# Patient Record
Sex: Female | Born: 1935 | Race: White | Hispanic: No | Smoking: Never smoker
Health system: Southern US, Community
[De-identification: ages and names within clinical notes are randomized; demographics above are authoritative.]

## PROBLEM LIST (undated history)

## (undated) DIAGNOSIS — J302 Other seasonal allergic rhinitis: Secondary | ICD-10-CM

## (undated) DIAGNOSIS — I639 Cerebral infarction, unspecified: Secondary | ICD-10-CM

## (undated) DIAGNOSIS — Z950 Presence of cardiac pacemaker: Secondary | ICD-10-CM

## (undated) DIAGNOSIS — Z9981 Dependence on supplemental oxygen: Secondary | ICD-10-CM

## (undated) DIAGNOSIS — E785 Hyperlipidemia, unspecified: Secondary | ICD-10-CM

## (undated) DIAGNOSIS — R42 Dizziness and giddiness: Secondary | ICD-10-CM

## (undated) DIAGNOSIS — I4891 Unspecified atrial fibrillation: Secondary | ICD-10-CM

## (undated) DIAGNOSIS — S92912A Unspecified fracture of left toe(s), initial encounter for closed fracture: Secondary | ICD-10-CM

## (undated) DIAGNOSIS — R0781 Pleurodynia: Secondary | ICD-10-CM

## (undated) DIAGNOSIS — B029 Zoster without complications: Secondary | ICD-10-CM

## (undated) DIAGNOSIS — N644 Mastodynia: Secondary | ICD-10-CM

## (undated) DIAGNOSIS — I779 Disorder of arteries and arterioles, unspecified: Secondary | ICD-10-CM

## (undated) DIAGNOSIS — R0600 Dyspnea, unspecified: Secondary | ICD-10-CM

## (undated) DIAGNOSIS — I10 Essential (primary) hypertension: Secondary | ICD-10-CM

## (undated) DIAGNOSIS — N63 Unspecified lump in unspecified breast: Secondary | ICD-10-CM

## (undated) DIAGNOSIS — R001 Bradycardia, unspecified: Secondary | ICD-10-CM

## (undated) DIAGNOSIS — I739 Peripheral vascular disease, unspecified: Secondary | ICD-10-CM

## (undated) HISTORY — DX: Zoster without complications: B02.9

## (undated) HISTORY — DX: Peripheral vascular disease, unspecified: I73.9

## (undated) HISTORY — DX: Hyperlipidemia, unspecified: E78.5

## (undated) HISTORY — DX: Disorder of arteries and arterioles, unspecified: I77.9

## (undated) HISTORY — PX: KNEE SURGERY: SHX244

## (undated) HISTORY — DX: Dyspnea, unspecified: R06.00

## (undated) HISTORY — DX: Unspecified lump in unspecified breast: N63.0

## (undated) HISTORY — DX: Presence of cardiac pacemaker: Z95.0

## (undated) HISTORY — DX: Unspecified fracture of left toe(s), initial encounter for closed fracture: S92.912A

## (undated) HISTORY — DX: Dizziness and giddiness: R42

## (undated) HISTORY — DX: Mastodynia: N64.4

## (undated) HISTORY — DX: Cerebral infarction, unspecified: I63.9

## (undated) HISTORY — DX: Other seasonal allergic rhinitis: J30.2

## (undated) HISTORY — DX: Bradycardia, unspecified: R00.1

## (undated) HISTORY — DX: Essential (primary) hypertension: I10

## (undated) HISTORY — DX: Unspecified atrial fibrillation: I48.91

## (undated) HISTORY — DX: Pleurodynia: R07.81

---

## 1998-09-01 ENCOUNTER — Other Ambulatory Visit: Admission: RE | Admit: 1998-09-01 | Discharge: 1998-09-01 | Payer: Self-pay | Admitting: *Deleted

## 1999-09-04 ENCOUNTER — Other Ambulatory Visit: Admission: RE | Admit: 1999-09-04 | Discharge: 1999-09-04 | Payer: Self-pay | Admitting: *Deleted

## 2000-09-02 ENCOUNTER — Other Ambulatory Visit: Admission: RE | Admit: 2000-09-02 | Discharge: 2000-09-02 | Payer: Self-pay | Admitting: *Deleted

## 2000-11-07 ENCOUNTER — Ambulatory Visit (HOSPITAL_COMMUNITY): Admission: RE | Admit: 2000-11-07 | Discharge: 2000-11-07 | Payer: Self-pay | Admitting: *Deleted

## 2001-01-16 ENCOUNTER — Ambulatory Visit (HOSPITAL_COMMUNITY): Admission: RE | Admit: 2001-01-16 | Discharge: 2001-01-16 | Payer: Self-pay | Admitting: *Deleted

## 2001-01-16 ENCOUNTER — Encounter (INDEPENDENT_AMBULATORY_CARE_PROVIDER_SITE_OTHER): Payer: Self-pay | Admitting: Specialist

## 2001-09-03 ENCOUNTER — Encounter: Payer: Self-pay | Admitting: *Deleted

## 2001-09-03 ENCOUNTER — Ambulatory Visit (HOSPITAL_COMMUNITY): Admission: RE | Admit: 2001-09-03 | Discharge: 2001-09-03 | Payer: Self-pay | Admitting: *Deleted

## 2002-08-28 ENCOUNTER — Ambulatory Visit (HOSPITAL_COMMUNITY): Admission: RE | Admit: 2002-08-28 | Discharge: 2002-08-28 | Payer: Self-pay | Admitting: Pulmonary Disease

## 2002-09-03 ENCOUNTER — Ambulatory Visit (HOSPITAL_COMMUNITY): Admission: RE | Admit: 2002-09-03 | Discharge: 2002-09-03 | Payer: Self-pay | Admitting: *Deleted

## 2002-09-03 ENCOUNTER — Encounter: Payer: Self-pay | Admitting: *Deleted

## 2002-10-12 ENCOUNTER — Encounter (HOSPITAL_COMMUNITY): Admission: RE | Admit: 2002-10-12 | Discharge: 2002-11-11 | Payer: Self-pay | Admitting: Cardiology

## 2002-11-28 ENCOUNTER — Ambulatory Visit (HOSPITAL_COMMUNITY): Admission: RE | Admit: 2002-11-28 | Discharge: 2002-11-28 | Payer: Self-pay | Admitting: Pulmonary Disease

## 2003-09-09 ENCOUNTER — Ambulatory Visit (HOSPITAL_COMMUNITY): Admission: RE | Admit: 2003-09-09 | Discharge: 2003-09-09 | Payer: Self-pay | Admitting: Obstetrics & Gynecology

## 2003-09-09 ENCOUNTER — Encounter: Payer: Self-pay | Admitting: Obstetrics & Gynecology

## 2004-01-05 ENCOUNTER — Ambulatory Visit (HOSPITAL_COMMUNITY): Admission: RE | Admit: 2004-01-05 | Discharge: 2004-01-05 | Payer: Self-pay | Admitting: Pulmonary Disease

## 2004-04-12 ENCOUNTER — Ambulatory Visit: Admission: RE | Admit: 2004-04-12 | Discharge: 2004-04-12 | Payer: Self-pay | Admitting: Orthopaedic Surgery

## 2004-04-25 ENCOUNTER — Inpatient Hospital Stay (HOSPITAL_COMMUNITY): Admission: RE | Admit: 2004-04-25 | Discharge: 2004-04-28 | Payer: Self-pay | Admitting: Orthopaedic Surgery

## 2004-04-28 ENCOUNTER — Inpatient Hospital Stay (HOSPITAL_COMMUNITY)
Admission: RE | Admit: 2004-04-28 | Discharge: 2004-05-04 | Payer: Self-pay | Admitting: Physical Medicine & Rehabilitation

## 2004-05-29 ENCOUNTER — Encounter (HOSPITAL_COMMUNITY): Admission: RE | Admit: 2004-05-29 | Discharge: 2004-06-28 | Payer: Self-pay | Admitting: Orthopaedic Surgery

## 2004-09-14 ENCOUNTER — Encounter: Payer: Self-pay | Admitting: Obstetrics & Gynecology

## 2005-09-17 ENCOUNTER — Ambulatory Visit (HOSPITAL_COMMUNITY): Admission: RE | Admit: 2005-09-17 | Discharge: 2005-09-17 | Payer: Self-pay | Admitting: Obstetrics & Gynecology

## 2005-10-02 ENCOUNTER — Ambulatory Visit (HOSPITAL_COMMUNITY): Admission: RE | Admit: 2005-10-02 | Discharge: 2005-10-02 | Payer: Self-pay | Admitting: Pulmonary Disease

## 2005-12-14 ENCOUNTER — Ambulatory Visit: Payer: Self-pay | Admitting: Cardiology

## 2005-12-14 ENCOUNTER — Ambulatory Visit (HOSPITAL_COMMUNITY): Admission: RE | Admit: 2005-12-14 | Discharge: 2005-12-14 | Payer: Self-pay | Admitting: Pulmonary Disease

## 2006-05-03 ENCOUNTER — Ambulatory Visit: Payer: Self-pay | Admitting: *Deleted

## 2006-05-10 ENCOUNTER — Ambulatory Visit: Payer: Self-pay | Admitting: *Deleted

## 2006-05-10 ENCOUNTER — Encounter (HOSPITAL_COMMUNITY): Admission: RE | Admit: 2006-05-10 | Discharge: 2006-06-09 | Payer: Self-pay | Admitting: *Deleted

## 2006-05-14 ENCOUNTER — Ambulatory Visit: Payer: Self-pay | Admitting: *Deleted

## 2006-06-07 ENCOUNTER — Ambulatory Visit (HOSPITAL_COMMUNITY): Admission: RE | Admit: 2006-06-07 | Discharge: 2006-06-07 | Payer: Self-pay | Admitting: Pulmonary Disease

## 2006-10-01 ENCOUNTER — Ambulatory Visit (HOSPITAL_COMMUNITY): Admission: RE | Admit: 2006-10-01 | Discharge: 2006-10-01 | Payer: Self-pay | Admitting: Obstetrics & Gynecology

## 2006-12-11 ENCOUNTER — Ambulatory Visit (HOSPITAL_COMMUNITY): Admission: RE | Admit: 2006-12-11 | Discharge: 2006-12-11 | Payer: Self-pay | Admitting: Pulmonary Disease

## 2007-04-23 ENCOUNTER — Ambulatory Visit: Payer: Self-pay | Admitting: Orthopedic Surgery

## 2007-06-04 ENCOUNTER — Ambulatory Visit: Payer: Self-pay | Admitting: Orthopedic Surgery

## 2007-07-29 ENCOUNTER — Ambulatory Visit: Payer: Self-pay | Admitting: Cardiovascular Disease

## 2007-07-30 ENCOUNTER — Ambulatory Visit (HOSPITAL_COMMUNITY): Admission: RE | Admit: 2007-07-30 | Discharge: 2007-07-30 | Payer: Self-pay | Admitting: Pulmonary Disease

## 2007-08-25 ENCOUNTER — Ambulatory Visit (HOSPITAL_COMMUNITY): Admission: RE | Admit: 2007-08-25 | Discharge: 2007-08-25 | Payer: Self-pay | Admitting: Pulmonary Disease

## 2007-12-29 ENCOUNTER — Ambulatory Visit (HOSPITAL_COMMUNITY): Admission: RE | Admit: 2007-12-29 | Discharge: 2007-12-29 | Payer: Self-pay | Admitting: Obstetrics & Gynecology

## 2008-01-09 ENCOUNTER — Ambulatory Visit (HOSPITAL_COMMUNITY): Admission: RE | Admit: 2008-01-09 | Discharge: 2008-01-09 | Payer: Self-pay | Admitting: Obstetrics & Gynecology

## 2008-01-09 ENCOUNTER — Other Ambulatory Visit: Admission: RE | Admit: 2008-01-09 | Discharge: 2008-01-09 | Payer: Self-pay | Admitting: Obstetrics and Gynecology

## 2008-04-16 ENCOUNTER — Ambulatory Visit: Payer: Self-pay | Admitting: Internal Medicine

## 2008-04-21 ENCOUNTER — Ambulatory Visit (HOSPITAL_COMMUNITY): Admission: RE | Admit: 2008-04-21 | Discharge: 2008-04-21 | Payer: Self-pay | Admitting: Internal Medicine

## 2008-04-21 ENCOUNTER — Ambulatory Visit: Payer: Self-pay | Admitting: Internal Medicine

## 2008-05-13 ENCOUNTER — Emergency Department (HOSPITAL_COMMUNITY): Admission: EM | Admit: 2008-05-13 | Discharge: 2008-05-13 | Payer: Self-pay | Admitting: Emergency Medicine

## 2008-05-28 ENCOUNTER — Ambulatory Visit: Payer: Self-pay | Admitting: Internal Medicine

## 2008-08-10 ENCOUNTER — Ambulatory Visit (HOSPITAL_COMMUNITY): Admission: RE | Admit: 2008-08-10 | Discharge: 2008-08-10 | Payer: Self-pay | Admitting: Pulmonary Disease

## 2008-11-08 ENCOUNTER — Inpatient Hospital Stay (HOSPITAL_COMMUNITY): Admission: EM | Admit: 2008-11-08 | Discharge: 2008-11-16 | Payer: Self-pay | Admitting: Emergency Medicine

## 2008-11-08 ENCOUNTER — Ambulatory Visit: Payer: Self-pay | Admitting: Orthopedic Surgery

## 2008-11-10 ENCOUNTER — Encounter: Payer: Self-pay | Admitting: Orthopedic Surgery

## 2008-11-12 ENCOUNTER — Encounter: Payer: Self-pay | Admitting: Orthopedic Surgery

## 2008-11-16 ENCOUNTER — Encounter: Payer: Self-pay | Admitting: Orthopedic Surgery

## 2008-11-22 ENCOUNTER — Telehealth: Payer: Self-pay | Admitting: Orthopedic Surgery

## 2008-11-22 ENCOUNTER — Encounter: Payer: Self-pay | Admitting: Orthopedic Surgery

## 2008-11-26 ENCOUNTER — Encounter: Payer: Self-pay | Admitting: Orthopedic Surgery

## 2008-11-28 ENCOUNTER — Encounter: Payer: Self-pay | Admitting: Orthopedic Surgery

## 2008-11-29 ENCOUNTER — Ambulatory Visit: Payer: Self-pay | Admitting: Orthopedic Surgery

## 2008-11-29 DIAGNOSIS — S52539A Colles' fracture of unspecified radius, initial encounter for closed fracture: Secondary | ICD-10-CM | POA: Insufficient documentation

## 2008-12-13 ENCOUNTER — Telehealth: Payer: Self-pay | Admitting: Orthopedic Surgery

## 2008-12-23 ENCOUNTER — Ambulatory Visit: Payer: Self-pay | Admitting: Orthopedic Surgery

## 2008-12-27 ENCOUNTER — Encounter: Payer: Self-pay | Admitting: Orthopedic Surgery

## 2009-01-06 ENCOUNTER — Ambulatory Visit: Payer: Self-pay | Admitting: Orthopedic Surgery

## 2009-01-11 ENCOUNTER — Encounter: Payer: Self-pay | Admitting: Orthopedic Surgery

## 2009-01-11 ENCOUNTER — Ambulatory Visit (HOSPITAL_COMMUNITY): Admission: RE | Admit: 2009-01-11 | Discharge: 2009-01-11 | Payer: Self-pay | Admitting: Pulmonary Disease

## 2009-01-12 ENCOUNTER — Telehealth: Payer: Self-pay | Admitting: Orthopedic Surgery

## 2009-01-12 ENCOUNTER — Encounter: Payer: Self-pay | Admitting: Orthopedic Surgery

## 2009-01-13 ENCOUNTER — Encounter: Payer: Self-pay | Admitting: Orthopedic Surgery

## 2009-02-10 ENCOUNTER — Encounter: Payer: Self-pay | Admitting: Orthopedic Surgery

## 2009-02-15 ENCOUNTER — Encounter: Payer: Self-pay | Admitting: Orthopedic Surgery

## 2009-02-21 ENCOUNTER — Ambulatory Visit: Payer: Self-pay | Admitting: Orthopedic Surgery

## 2009-02-21 DIAGNOSIS — M2550 Pain in unspecified joint: Secondary | ICD-10-CM | POA: Insufficient documentation

## 2009-02-21 DIAGNOSIS — G5600 Carpal tunnel syndrome, unspecified upper limb: Secondary | ICD-10-CM

## 2009-02-21 DIAGNOSIS — M75120 Complete rotator cuff tear or rupture of unspecified shoulder, not specified as traumatic: Secondary | ICD-10-CM

## 2009-02-24 ENCOUNTER — Encounter: Payer: Self-pay | Admitting: Orthopedic Surgery

## 2009-02-25 ENCOUNTER — Encounter (INDEPENDENT_AMBULATORY_CARE_PROVIDER_SITE_OTHER): Payer: Self-pay | Admitting: *Deleted

## 2009-03-07 ENCOUNTER — Encounter: Payer: Self-pay | Admitting: Orthopedic Surgery

## 2009-03-15 ENCOUNTER — Encounter: Payer: Self-pay | Admitting: Orthopedic Surgery

## 2009-03-18 ENCOUNTER — Encounter: Payer: Self-pay | Admitting: Orthopedic Surgery

## 2009-03-21 ENCOUNTER — Telehealth: Payer: Self-pay | Admitting: Orthopedic Surgery

## 2009-03-21 ENCOUNTER — Ambulatory Visit: Payer: Self-pay | Admitting: Orthopedic Surgery

## 2009-03-22 ENCOUNTER — Encounter: Payer: Self-pay | Admitting: Orthopedic Surgery

## 2009-03-23 ENCOUNTER — Encounter: Payer: Self-pay | Admitting: Orthopedic Surgery

## 2009-03-28 ENCOUNTER — Encounter: Payer: Self-pay | Admitting: Orthopedic Surgery

## 2009-04-04 ENCOUNTER — Encounter: Payer: Self-pay | Admitting: Orthopedic Surgery

## 2009-04-08 ENCOUNTER — Telehealth: Payer: Self-pay | Admitting: Orthopedic Surgery

## 2009-04-11 ENCOUNTER — Encounter: Payer: Self-pay | Admitting: Orthopedic Surgery

## 2009-04-12 ENCOUNTER — Ambulatory Visit: Payer: Self-pay | Admitting: Orthopedic Surgery

## 2009-04-12 ENCOUNTER — Ambulatory Visit (HOSPITAL_COMMUNITY): Admission: RE | Admit: 2009-04-12 | Discharge: 2009-04-12 | Payer: Self-pay | Admitting: Orthopedic Surgery

## 2009-04-14 ENCOUNTER — Ambulatory Visit: Payer: Self-pay | Admitting: Orthopedic Surgery

## 2009-04-21 ENCOUNTER — Ambulatory Visit: Payer: Self-pay | Admitting: Orthopedic Surgery

## 2009-05-03 ENCOUNTER — Encounter: Payer: Self-pay | Admitting: Orthopedic Surgery

## 2009-05-04 ENCOUNTER — Ambulatory Visit (HOSPITAL_COMMUNITY): Admission: RE | Admit: 2009-05-04 | Discharge: 2009-05-04 | Payer: Self-pay | Admitting: Obstetrics and Gynecology

## 2009-05-12 ENCOUNTER — Ambulatory Visit: Payer: Self-pay | Admitting: Orthopedic Surgery

## 2009-06-02 ENCOUNTER — Encounter (INDEPENDENT_AMBULATORY_CARE_PROVIDER_SITE_OTHER): Payer: Self-pay | Admitting: *Deleted

## 2009-06-02 LAB — CONVERTED CEMR LAB
BUN: 17 mg/dL
Calcium: 8.9 mg/dL
Chloride: 102 meq/L
Glucose, Bld: 102 mg/dL
LDL Cholesterol: 88 mg/dL
Potassium: 4.3 meq/L
Triglycerides: 94 mg/dL

## 2009-06-30 ENCOUNTER — Ambulatory Visit: Payer: Self-pay | Admitting: Orthopedic Surgery

## 2009-06-30 DIAGNOSIS — M7580 Other shoulder lesions, unspecified shoulder: Secondary | ICD-10-CM

## 2009-11-15 ENCOUNTER — Ambulatory Visit: Payer: Self-pay | Admitting: Cardiology

## 2009-11-15 ENCOUNTER — Encounter: Payer: Self-pay | Admitting: Cardiology

## 2009-11-15 DIAGNOSIS — R55 Syncope and collapse: Secondary | ICD-10-CM | POA: Insufficient documentation

## 2009-11-15 DIAGNOSIS — R0602 Shortness of breath: Secondary | ICD-10-CM

## 2009-11-15 DIAGNOSIS — I6529 Occlusion and stenosis of unspecified carotid artery: Secondary | ICD-10-CM | POA: Insufficient documentation

## 2009-11-21 ENCOUNTER — Ambulatory Visit: Payer: Self-pay | Admitting: Cardiology

## 2009-11-21 ENCOUNTER — Encounter: Payer: Self-pay | Admitting: Cardiology

## 2009-11-21 ENCOUNTER — Ambulatory Visit (HOSPITAL_COMMUNITY): Admission: RE | Admit: 2009-11-21 | Discharge: 2009-11-21 | Payer: Self-pay | Admitting: Cardiology

## 2009-11-22 ENCOUNTER — Encounter (INDEPENDENT_AMBULATORY_CARE_PROVIDER_SITE_OTHER): Payer: Self-pay | Admitting: *Deleted

## 2009-11-23 ENCOUNTER — Encounter (INDEPENDENT_AMBULATORY_CARE_PROVIDER_SITE_OTHER): Payer: Self-pay | Admitting: *Deleted

## 2009-12-04 ENCOUNTER — Ambulatory Visit: Payer: Self-pay | Admitting: Cardiology

## 2009-12-12 ENCOUNTER — Encounter (INDEPENDENT_AMBULATORY_CARE_PROVIDER_SITE_OTHER): Payer: Self-pay | Admitting: *Deleted

## 2009-12-12 ENCOUNTER — Ambulatory Visit: Payer: Self-pay | Admitting: Cardiology

## 2010-01-05 ENCOUNTER — Ambulatory Visit: Payer: Self-pay | Admitting: Orthopedic Surgery

## 2010-01-05 DIAGNOSIS — IMO0002 Reserved for concepts with insufficient information to code with codable children: Secondary | ICD-10-CM | POA: Insufficient documentation

## 2010-01-11 ENCOUNTER — Encounter: Payer: Self-pay | Admitting: Cardiology

## 2010-01-13 ENCOUNTER — Encounter: Payer: Self-pay | Admitting: Orthopedic Surgery

## 2010-02-02 ENCOUNTER — Ambulatory Visit (HOSPITAL_COMMUNITY): Admission: RE | Admit: 2010-02-02 | Discharge: 2010-02-02 | Payer: Self-pay | Admitting: Orthopaedic Surgery

## 2010-02-02 ENCOUNTER — Telehealth: Payer: Self-pay | Admitting: Orthopedic Surgery

## 2010-05-11 ENCOUNTER — Ambulatory Visit (HOSPITAL_COMMUNITY)
Admission: RE | Admit: 2010-05-11 | Discharge: 2010-05-11 | Payer: Self-pay | Source: Home / Self Care | Admitting: Obstetrics & Gynecology

## 2010-07-13 ENCOUNTER — Ambulatory Visit: Payer: Self-pay | Admitting: Cardiology

## 2010-07-13 DIAGNOSIS — I10 Essential (primary) hypertension: Secondary | ICD-10-CM | POA: Insufficient documentation

## 2010-12-13 ENCOUNTER — Ambulatory Visit (HOSPITAL_COMMUNITY)
Admission: RE | Admit: 2010-12-13 | Discharge: 2010-12-13 | Payer: Self-pay | Source: Home / Self Care | Attending: Pulmonary Disease | Admitting: Pulmonary Disease

## 2010-12-18 ENCOUNTER — Ambulatory Visit (HOSPITAL_COMMUNITY)
Admission: RE | Admit: 2010-12-18 | Discharge: 2010-12-18 | Payer: Self-pay | Source: Home / Self Care | Attending: Pulmonary Disease | Admitting: Pulmonary Disease

## 2010-12-25 LAB — BLOOD GAS, ARTERIAL
Acid-base deficit: 2.3 mmol/L — ABNORMAL HIGH (ref 0.0–2.0)
Bicarbonate: 21.5 mEq/L (ref 20.0–24.0)
O2 Saturation: 95.4 %
Patient temperature: 37
TCO2: 18.9 mmol/L (ref 0–100)
pCO2 arterial: 33.5 mmHg — ABNORMAL LOW (ref 35.0–45.0)
pH, Arterial: 7.423 — ABNORMAL HIGH (ref 7.350–7.400)
pO2, Arterial: 74.1 mmHg — ABNORMAL LOW (ref 80.0–100.0)

## 2011-01-11 NOTE — Letter (Signed)
Summary: Historic Patient File  Historic Patient File   Imported By: Jacklynn Ganong 01/13/2010 09:32:35  _____________________________________________________________________  External Attachment:    Type:   Image     Comment:   External Document

## 2011-01-11 NOTE — Progress Notes (Signed)
Summary: call from patient cancel fol up appt  Phone Note Call from Patient   Caller: Patient Summary of Call: Patient called to cancel fol/up appointment and to call back when ready to re-schedule. Initial call taken by: Cammie Sickle,  February 02, 2010 9:34 AM

## 2011-01-11 NOTE — Assessment & Plan Note (Signed)
Summary: Z6X   Visit Type:  Follow-up Primary Jatoya Armbrister:  Dr. Kari Baars   History of Present Illness: 77 year old woman presents for followup. She reports no problems with progressive dizziness, certainly no episodes of syncope. She has had some right ankle swelling over the last week, although has not been using her p.r.n. Lasix. Blood pressure is elevated today, and we discussed this. She does have room in her anti-hypertensive regiment.  Electrocardiogram is reviewed below.  Current Medications (verified): 1)  Omeprazole 20 Mg Cpdr (Omeprazole) .... Take 1 Tab Daily 2)  Naproxen Dr 500 Mg Tbec (Naproxen) .... Take 1 Tab Two Times A Day 3)  Restasis 0.05 % Emul (Cyclosporine) .Marland Kitchen.. 1 Drop Each Eye 4)  Soothe Xp  Soln (Artificial Tear Solution) .... Take 1 Drop Each Eye As Directed 5)  Adult Aspirin Ec Low Strength 81 Mg Tbec (Aspirin) .... Take 1 Tab Daily 6)  Cvs Vitamin B-6 100 Mg Tabs (Pyridoxine Hcl) .... Take 1 Tab Daily 7)  Advanced E 400 Unit Caps (Vitamin E-Misc Nat Products) .... Take 1 Tab Daily 8)  Vitamin C 1000 Mg Tabs (Ascorbic Acid) .... Take 1 Tab Daily 9)  Benefiber Drink Mix  Powd (Wheat Dextrin) .... Take 1 Daily 10)  Furosemide 40 Mg Tabs (Furosemide) .... Take As Needed 11)  Tylenol Extra Strength 500 Mg Tabs (Acetaminophen) .... Take As Needed For Pain 12)  Ventolin Hfa 108 (90 Base) Mcg/act Aers (Albuterol Sulfate) .... Take 2 Puffs Four Times Daily 13)  K-Lor 20 Meq Pack (Potassium Chloride) .... Take As Needed 14)  Calcium 600 1500 Mg Tabs (Calcium Carbonate) .... Take 1 Tab Daily 15)  Spiriva Handihaler 18 Mcg Caps (Tiotropium Bromide Monohydrate) .... Use 1 Cap Daily 16)  Losartan Potassium-Hctz 50-12.5 Mg Tabs (Losartan Potassium-Hctz) .... Take 1 Tab Daily  Allergies (verified): No Known Drug Allergies  Past History:  Social History: Last updated: 11/15/2009 Retired  Married  Tobacco Use - No Alcohol Use - no Drug Use - no  Past Medical  History: Dyspnea - previous CPX test suggesting possible component of restrictive physiology, respiratory muscle fatigue, and diastolic dysfunction Seasonal Allergies Hyperlipidemia Hypertension Carotid artery disease Presyncope  Clinical Review Panels:  Echocardiogram Echocardiogram  Study Conclusions    - Left ventricle: The cavity size was normal. There was mild     concentric hypertrophy. Systolic function was vigorous. The     estimated ejection fraction was in the range of 65% to 70%. Wall     motion was normal; there were no regional wall motion     abnormalities.   - Aortic valve: Mildly calcified annulus. Trileaflet; normal     thickness leaflets.   - Mitral valve: Moderately calcified annulus.   - Left atrium: The atrium was mildly dilated.   - Right ventricle: The cavity size was normal. Wall thickness was     mildly increased.   Transthoracic echocardiography. M-mode, complete 2D, spectral   Doppler, and color Doppler. Height: Height: 165.1cm. Height: 65in.   Weight: Weight: 81.6kg. Weight: 179.6lb. Body mass index: BMI:   30kg/m^2. Body surface area: BSA: 1.65m^2. Patient status:   Outpatient. Location: Echo laboratory.    -------------------------------------------------------------------- (11/21/2009)    Review of Systems       The patient complains of peripheral edema.  The patient denies anorexia, fever, chest pain, syncope, dyspnea on exertion, prolonged cough, melena, and hematochezia.         Otherwise reviewed and negative.  Vital Signs:  Patient profile:   77 year old female Weight:      189 pounds BMI:     31.56 O2 Sat:      96 % on Room air Pulse rate:   88 / minute BP sitting:   173 / 77  (right arm)  Vitals Entered By: Dreama Saa, CNA (July 13, 2010 11:04 AM)  O2 Flow:  Room air  Physical Exam  Additional Exam:  Overweight elderly woman in no acute distress. HEENT: Conjunctiva and lids normal, oropharynx with moist mucosa. Neck:  Supple, no elevated JVP or bruits. Lungs: Clear to auscultation, nonlabored. Cardiac: Regular rate and rhythm, S4, no S3. Extremities: 1+ ankle edema, right greater than left.     EKG  Procedure date:  07/13/2010  Findings:      Sinus rhythm at 89 beats per minute, low voltage.  Impression & Recommendations:  Problem # 1:  SYNCOPE (ICD-780.2)  History of presyncope, not progressive. Workup has been reassuring, as detailed in the previous note. Electrocardiogram is nonspecific today. Continue observation, with followup in 6 months.  Her updated medication list for this problem includes:    Adult Aspirin Ec Low Strength 81 Mg Tbec (Aspirin) .Marland Kitchen... Take 1 tab daily  Problem # 2:  ESSENTIAL HYPERTENSION, BENIGN (ICD-401.1)  Blood pressure elevated today. She does have room for further up titration of losartan hydrochlorothiazide. I asked her to follow up with Dr. Juanetta Gosling.  The following medications were removed from the medication list:    Avalide 150-12.5 Mg Tabs (Irbesartan-hydrochlorothiazide) .Marland Kitchen... Take 1 tab daily Her updated medication list for this problem includes:    Adult Aspirin Ec Low Strength 81 Mg Tbec (Aspirin) .Marland Kitchen... Take 1 tab daily    Furosemide 40 Mg Tabs (Furosemide) .Marland Kitchen... Take as needed    Losartan Potassium-hctz 50-12.5 Mg Tabs (Losartan potassium-hctz) .Marland Kitchen... Take 1 tab daily  Problem # 3:  CAROTID STENOSIS (ICD-433.10)  Mild atherosclerosis. Continues on aspirin.  Her updated medication list for this problem includes:    Adult Aspirin Ec Low Strength 81 Mg Tbec (Aspirin) .Marland Kitchen... Take 1 tab daily  Patient Instructions: 1)  Your physician recommends that you schedule a follow-up appointment in: 6 months 2)  Your physician recommends that you continue on your current medications as directed. Please refer to the Current Medication list given to you today.

## 2011-01-11 NOTE — Assessment & Plan Note (Signed)
Summary: 1 mth f/u per checkout on 11/15/09/tg   Visit Type:  Follow-up Primary Provider:  Dr. Kari Baars   History of Present Illness: 77 year old woman returns for a followup visit. She reports feeling better. She has had no presyncope, syncope, and no chest pain.  Cardiac monitor data that has returned so far demonstrates sinus rhythm, with some sinus bradycardia and sinus tachycardia, no pauses, and rare premature atrial and ventricular complexes. No other sustained arrhythmias are noted.  Carotid duplex imaging revealed mild common carotid and carotid bulb plaque without significant stenosis.  2-D echocardiography demonstrates a left ventricular ejection fraction of 65-70% without regional wall motion abnormalities, and no major valvular abnormalities.  I reviewed these studies with the patient and her son. She is most comfortable with observation at this point.  Current Medications (verified): 1)  Omeprazole 20 Mg Cpdr (Omeprazole) .... Take 1 Tab Daily 2)  Naproxen Dr 500 Mg Tbec (Naproxen) .... Take 1 Tab Two Times A Day 3)  Restasis 0.05 % Emul (Cyclosporine) .Marland Kitchen.. 1 Drop Each Eye 4)  Soothe Xp  Soln (Artificial Tear Solution) .... Take 1 Drop Each Eye As Directed 5)  Adult Aspirin Ec Low Strength 81 Mg Tbec (Aspirin) .... Take 1 Tab Daily 6)  Cvs Vitamin B-6 100 Mg Tabs (Pyridoxine Hcl) .... Take 1 Tab Daily 7)  Advanced E 400 Unit Caps (Vitamin E-Misc Nat Products) .... Take 1 Tab Daily 8)  Vitamin C 1000 Mg Tabs (Ascorbic Acid) .... Take 1 Tab Daily 9)  Benefiber Drink Mix  Powd (Wheat Dextrin) .... Take 1 Daily 10)  Furosemide 40 Mg Tabs (Furosemide) .... Take As Needed 11)  Triamcinolone Acetonide 0.1 % Lotn (Triamcinolone Acetonide) 12)  Tylenol Extra Strength 500 Mg Tabs (Acetaminophen) .... Take As Needed For Pain 13)  Ventolin Hfa 108 (90 Base) Mcg/act Aers (Albuterol Sulfate) .... Take 2 Puffs Four Times Daily 14)  K-Lor 20 Meq Pack (Potassium Chloride) .... Take  1 Tab Daily 15)  Calcium 600 1500 Mg Tabs (Calcium Carbonate) .... Take 1 Tab Daily 16)  Avalide 150-12.5 Mg Tabs (Irbesartan-Hydrochlorothiazide) .... Take 1 Tab Daily  Allergies (verified): No Known Drug Allergies  Past History:  Past Medical History: Last updated: 11/15/2009 Dyspnea - previous CPX test suggesting possible component of restrictive physiology, respiratory muscle fatigue, and diastolic dysfunction Seasonal Allergies Hyperlipidemia Hypertension Carotid artery disease  Social History: Last updated: 11/15/2009 Retired  Married  Tobacco Use - No Alcohol Use - no Drug Use - no  Review of Systems       The patient complains of dyspnea on exertion.  The patient denies anorexia, fever, chest pain, syncope, headaches, hemoptysis, melena, hematochezia, and severe indigestion/heartburn.         Otherwise reviewed and negative.  Vital Signs:  Patient profile:   77 year old female Weight:      184 pounds Pulse rate:   70 / minute BP sitting:   132 / 67  (right arm)  Vitals Entered By: Dreama Saa, CNA (December 12, 2009 11:47 AM)  Physical Exam  Additional Exam:  Overweight elderly woman in no acute distress. HEENT: Conjunctiva normal. Resolving ecchymosis in right periorbital area. Oropharynx with moist mucosa. Neck: Supple, bilateral carotid bruits, no thyromegaly. Lungs: Clear with diminished breath sounds, nonlabored. Cardiac: Regular rate and rhythm, soft systolic murmur at the base, no S3 gallop or pericardial rub. Abdomen: Soft, no bruits, nontender, bowel sounds present. Extremities: No pitting edema, distal pulses 1-2+.   Impression &  Recommendations:  Problem # 1:  SYNCOPE (ICD-780.2)  Technically episodic presyncope, improved since last visit without specific intervention. Evaluation to date is reassuring, including echocardiogram, carotid Dopplers, and available cardiac monitor strips. She had no clear or pauses, symptomatic bradycardia, or  rapid dysrhythmias. She is not reporting any chest pain on baseline chronic dyspnea on exertion. Observation will be adopted at this time. I will see her back in 6 months.  Her updated medication list for this problem includes:    Adult Aspirin Ec Low Strength 81 Mg Tbec (Aspirin) .Marland Kitchen... Take 1 tab daily  Problem # 2:  CAROTID STENOSIS (ICD-433.10)  Only mild carotid plaquing noted by recent followup duplex scan.  Her updated medication list for this problem includes:    Adult Aspirin Ec Low Strength 81 Mg Tbec (Aspirin) .Marland Kitchen... Take 1 tab daily  Patient Instructions: 1)  Your physician recommends that you schedule a follow-up appointment in: 6 months 2)  Your physician recommends that you continue on your current medications as directed. Please refer to the Current Medication list given to you today.

## 2011-01-11 NOTE — Assessment & Plan Note (Signed)
Summary: rt knee pain needs xr/humana/bsf   Visit Type:  Follow-up, new problem Primary Provider:  Dr. Kari Baars  CC:  right hip pain.  History of Present Illness: This is a 77 year old female had bilateral wrist fractures one requiring internal fixation she still has some residual disability is from that but today comes in complaining of RIGHT hip pain she's also had a RIGHT total knee arthroplasty  She has a history of lumbar disc disease.  She complains of pain in the RIGHT side of her back which radiates across the PIP and into the RIGHT knee, it is associated with a throbbing sensation catching and swelling.  It is worse when she is lifting or sitting.  It came on gradually and is present in the morning and in the night and it is intermittent.  It is moderate pain and she rates it a 6/10.  Review of systems she reports weight gain shortness of breath heartburn constipation swelling stiffness dizziness denies blurred vision chest pain frequency urgency changes in skin nervousness anxiety easy bleeding or adverse drug reactions or seasonal ALLERGIES  Are there any red flags:  Numbness, tingling ? Bowel/bladder problem?  Saddle anesthesisa? Fever, Night sweats?  No to all of the above     Allergies: No Known Drug Allergies  Past History:  Past Medical History: Last updated: 12-14-2009 Dyspnea - previous CPX test suggesting possible component of restrictive physiology, respiratory muscle fatigue, and diastolic dysfunction Seasonal Allergies Hyperlipidemia Hypertension Carotid artery disease  Past Surgical History: Last updated: 2009/12/14 Bilateral knee replacement Partial hysterectomy Closed reduction and internal fixation of right wrist with pinning of left wrist, December 2009 Left carpal tunnel release, May 2010  Family History: Last updated: 12/14/2009 Father: deceased due to stroke age 53 Mother: deceased age 67 cause unknown, had pacemaker  Social  History: Last updated: 14-Dec-2009 Retired  Married  Tobacco Use - No Alcohol Use - no Drug Use - no  Risk Factors: Exercise: no (2009/12/14)  Risk Factors: Smoking Status: never (December 14, 2009)  Review of Systems      See HPI  Physical Exam  Additional Exam:  this is a pleasant well-developed well-nourished female hygiene are intact she has no gross deformity  She has normal pulses and perfusion to the RIGHT lower extremity as well as the upper extremities.  She has no lymphadenopathy.  Her skin is warm dry and intact there is an incision of the RIGHT knee which is well healed there is a surgical incision from an external fixator in her wrist  She has normal sensation and negative straight leg raise on the RIGHT side.  Her LEFT side is normal.  She has a negative straight leg raise.  She has normal range of motion in the hip, good strength in the muscles, no instability and no tenderness  Her gait is altered with a favoring of the RIGHT side.     Impression & Recommendations:  Problem # 1:  BACK PAIN WITH RADICULOPATHY (ICD-729.2) Assessment New  x-rays of the pelvis to evaluate hip pain and get back to her evaluate back pain  Pelvis shows symmetric hips, spherical femoral heads and adequate joint space  Impression normal hip exam  AP lateral and spot film of the lumbar spine shows degenerative scoliosis.  Impression degenerative scoliosis  The patient is not having weakness or reflex change or constant pain I think we can manage him medically.  Recommend Neurontin 100 mg 3 times a day  Orders: Est. Patient Level IV (52841)  Pelvis x-ray, 1/2 views (16109) Lumbosacral Spine ,2/3 views (72100)  Medications Added to Medication List This Visit: 1)  Neurontin 100 Mg Caps (Gabapentin) .Marland Kitchen.. 1 by mouth three times a day  Patient Instructions: 1)  Take Neurontin 1 three times a day  2)  return in 1 month  3)  Diagnosis: Lubar disc degeneration and scoliosis with  "pinched nerve"  Prescriptions: NEURONTIN 100 MG CAPS (GABAPENTIN) 1 by mouth three times a day  #60 x 2   Entered and Authorized by:   Fuller Canada MD   Signed by:   Fuller Canada MD on 01/05/2010   Method used:   Print then Give to Patient   RxID:   [id number]

## 2011-01-11 NOTE — Procedures (Signed)
Summary: LIFE WATCH  LIFE WATCH   Imported By: Faythe Ghee 01/11/2010 11:43:24  _____________________________________________________________________  External Attachment:    Type:   Image     Comment:   External Document

## 2011-01-11 NOTE — Miscellaneous (Signed)
Summary: labs bmp,lipids,liver 06/02/2009  Clinical Lists Changes  Observations: Added new observation of CALCIUM: 8.9 mg/dL (16/09/9603 5:40) Added new observation of CREATININE: 0.92 mg/dL (98/10/9146 8:29) Added new observation of BUN: 17 mg/dL (56/21/3086 5:78) Added new observation of BG RANDOM: 102 mg/dL (46/96/2952 8:41) Added new observation of CO2 PLSM/SER: 21 meq/L (06/02/2009 8:19) Added new observation of CL SERUM: 102 meq/L (06/02/2009 8:19) Added new observation of K SERUM: 4.3 meq/L (06/02/2009 8:19) Added new observation of NA: 136 meq/L (06/02/2009 8:19) Added new observation of LDL: 88 mg/dL (32/44/0102 7:25) Added new observation of HDL: 49 mg/dL (36/64/4034 7:42) Added new observation of TRIGLYC TOT: 94 mg/dL (59/56/3875 6:43) Added new observation of CHOLESTEROL: 156 mg/dL (32/95/1884 1:66)

## 2011-01-18 ENCOUNTER — Encounter: Payer: Self-pay | Admitting: Cardiology

## 2011-01-18 ENCOUNTER — Ambulatory Visit (INDEPENDENT_AMBULATORY_CARE_PROVIDER_SITE_OTHER): Payer: Medicare PPO | Admitting: Cardiology

## 2011-01-18 DIAGNOSIS — I10 Essential (primary) hypertension: Secondary | ICD-10-CM

## 2011-01-18 DIAGNOSIS — I509 Heart failure, unspecified: Secondary | ICD-10-CM

## 2011-01-25 NOTE — Assessment & Plan Note (Signed)
Summary: 6 mth f/u per checkout on 07/13/10/tg   Visit Type:  Follow-up Primary Provider:  Dr. Kari Baars  CC:  edema in feet and legs mainly right one.  History of Present Illness: [name] is a very pleasant 77 y/o CF looking younger than stated age,we are following for diastolic CHF,  episodic dizziness, dyspnea on exertion, and presyncope. She has no clearly defined history of ischemic heart disease or dysrhythmia based on chart review.   She is here for routine 6 month follow-up. She is without complaint of chest pain. She has occasional and transient episodes of dizziness which do not lead to syncope. She sits down until the feeling passes.  She has had no episodes of falling.  No complaints of palpatations.  She admits that her energy level is not what it used to be but she still drives around in town (has stopped driving outside of [place]), cleans her house and goes shopping.  She has ongoing DOE which limits her stamina. She is also followed by Dr. Juanetta Gosling and uses inhalers daily.  Current Medications (verified): 1)  Omeprazole 20 Mg Cpdr (Omeprazole) .... Take 1 Tab Daily 2)  Restasis 0.05 % Emul (Cyclosporine) .Marland Kitchen.. 1 Drop Each Eye Two Times A Day 3)  Adult Aspirin Ec Low Strength 81 Mg Tbec (Aspirin) .... Take 1 Tab Daily 4)  Cvs Vitamin B-6 100 Mg Tabs (Pyridoxine Hcl) .... Take 1 Tab Daily 5)  Advanced E 400 Unit Caps (Vitamin E-Misc Nat Products) .... Take 1 Tab Daily 6)  Vitamin C 1000 Mg Tabs (Ascorbic Acid) .... Take 1 Tab Daily 7)  Benefiber Drink Mix  Powd (Wheat Dextrin) .... Take 1 Daily 8)  Furosemide 40 Mg Tabs (Furosemide) .... Take As Needed 9)  Ventolin Hfa 108 (90 Base) Mcg/act Aers (Albuterol Sulfate) .... Take 2 Puffs Four Times Daily 10)  K-Lor 20 Meq Pack (Potassium Chloride) .... Take As Needed 11)  Spiriva Handihaler 18 Mcg Caps (Tiotropium Bromide Monohydrate) .... Use 1 Cap Daily 12)  Losartan Potassium-Hctz 100-25 Mg Tabs (Losartan Potassium-Hctz)  .... Take 1 Tab Daily 13)  Advil 200 Mg Tabs (Ibuprofen) .... As Needed 14)  Systane Balance 0.6 % Soln (Propylene Glycol) .Marland Kitchen.. 1 Drop Each Eye Three Times A Day  Allergies (verified): No Known Drug Allergies  Comments:  Nurse/Medical Assistant: patient brought med list we reviewed med list she uses [facility]  Review of Systems       All other systems have been reviewed and are negative unless stated above.   Vital Signs:  Patient profile:   77 year old female Weight:      186 pounds BMI:     31.06 Pulse rate:   84 / minute BP sitting:   144 / 71  (left arm)  Vitals Entered By: Dreama Saa, CNA (January 18, 2011 3:13 PM)  Physical Exam  General:  Well developed, well nourished, in no acute distress.healthy appearing.   Lungs:  Mild crackles basilar no wheezes or rhonchi Heart:  Non-displaced PMI, chest non-tender; regular rate and rhythm, S1, S2 without murmurs, rubs or gallops. Carotid upstroke normal, no bruit. Normal abdominal aortic size, no bruits. Femorals normal pulses, no bruits. Pedals normal pulses. No edema, no varicosities. Abdomen:  Bowel sounds positive; abdomen soft and non-tender without masses, organomegaly, or hernias noted. No hepatosplenomegaly. Msk:  Uses cane for ambulation as she has two knee replacements that cause her to have some discomfort. Pulses:  pulses normal in all 4  extremities Extremities:  No clubbing or cyanosis.trace right pedal edema.   Neurologic:  Alert and oriented x 3. Psych:  Normal affect.   Impression & Recommendations:  Problem # 1:  ESSENTIAL HYPERTENSION, BENIGN (ICD-401.1) Blood pressure is moderately controlled at present.  Do not plan to change or increase current medications at this time.  She occaisonally takes lasix for fluid retention on a as needed basis.  I have advised her to be careful on those days and this may exacerbate dizziness with diuretic use in the setting of HCTZ in her antihypertensive  regime. Her updated medication list for this problem includes:    Adult Aspirin Ec Low Strength 81 Mg Tbec (Aspirin) .Marland Kitchen... Take 1 tab daily    Furosemide 40 Mg Tabs (Furosemide) .Marland Kitchen... Take as needed    Losartan Potassium-hctz 100-25 Mg Tabs (Losartan potassium-hctz) .Marland Kitchen... Take 1 tab daily  Problem # 2:  DYSPNEA (ICD-786.05) She is not having any increased symptoms at this time, but is chronically having DOE.  She rests and the breathing stablizes.  The inhalers help, but she states they do not last long.  She is to continue to follow up with Dr. Juanetta Gosling for this. Her updated medication list for this problem includes:    Adult Aspirin Ec Low Strength 81 Mg Tbec (Aspirin) .Marland Kitchen... Take 1 tab daily    Furosemide 40 Mg Tabs (Furosemide) .Marland Kitchen... Take as needed    Losartan Potassium-hctz 100-25 Mg Tabs (Losartan potassium-hctz) .Marland Kitchen... Take 1 tab daily  Patient Instructions: 1)  Your physician recommends that you schedule a follow-up appointment in: 6 month

## 2011-03-21 LAB — BASIC METABOLIC PANEL
CO2: 26 mEq/L (ref 19–32)
Calcium: 8.9 mg/dL (ref 8.4–10.5)
GFR calc non Af Amer: >60 mL/min (ref >60)
Glucose, Bld: 115 mg/dL — ABNORMAL HIGH (ref 70–99)
Sodium: 132 mEq/L — ABNORMAL LOW (ref 135–145)

## 2011-03-21 LAB — HEMOGLOBIN AND HEMATOCRIT, BLOOD: HCT: 36 % (ref 36.0–46.0)

## 2011-04-24 NOTE — Group Therapy Note (Signed)
Wendy Perkins, Wendy Perkins               ACCOUNT NO.:  [id number]   MEDICAL RECORD NO.:  [id number]          PATIENT TYPE:  INP   LOCATION:  A338                          FACILITY:  [facility]   PHYSICIAN:  Vickki Hearing, M.D.DATE OF BIRTH:  [birth year]   DATE OF PROCEDURE:  DATE OF DISCHARGE:                                 PROGRESS NOTE   She is an 77 year old female who fell on November 08, 2008, and was  worked up for a laceration over her forehead.  CT scan neck and head  were negative.  She sustained bilateral wrist fractures.   Surgery was scheduled for November 09, 2008, however, sustained an injury  in the operating room, had to postpone the surgery until Thursday.  I  have spoken with her about this and spoken to her sons about it as well.  They have agreed to stay at [facility] for the surgery.  They were  offered transfer if they wanted and they declined.  She has been  consulted by Dr. Juanetta Gosling.  He has cleared her for surgery.  We plan to  do a left closed reduction external fixation and a right percutaneous  pinning of the wrists on Thursday at 7:30 a.m.      Vickki Hearing, M.D.  Electronically Signed     SEH/MEDQ  D:  11/10/2008  T:  11/10/2008  Job:  [id number]

## 2011-04-24 NOTE — Group Therapy Note (Signed)
[name], [name]               ACCOUNT NO.:  [id number]   MEDICAL RECORD NO.:  [id number]          PATIENT TYPE:  INP   LOCATION:  A338                          FACILITY:  [facility]   PHYSICIAN:  Edward L. Juanetta Gosling, M.D.DATE OF BIRTH:  1919/04/09   DATE OF PROCEDURE:  DATE OF DISCHARGE:                                 PROGRESS NOTE   Wendy Perkins is the patient of Dr. Romeo Apple.   Wendy Perkins is better.  She has been able to ambulate a little bit, has  no new complaints.   Her physical examination this morning shows she is awake and alert.  Her  stitches have been removed.  She looks okay with that.  Temperature is  97.9, pulse 70, respirations 20, and blood pressure 153/83.  Her chest  is clear.  She still has trace edema of both legs.  O2 sat is 97% on 2  L.   ASSESSMENT:  She is better and continues to improve.   PLAN:  For transfer to the skilled care facility when a bed is  available.  She is going to be there for rehab.      Edward L. Juanetta Gosling, M.D.  Electronically Signed     ELH/MEDQ  D:  11/15/2008  T:  11/16/2008  Job:  [id number]

## 2011-04-24 NOTE — Group Therapy Note (Signed)
NAMERAKHI, ROMAGNOLI               ACCOUNT NO.:  [id number]   MEDICAL RECORD NO.:  [id number]          PATIENT TYPE:  INP   LOCATION:  A338                          FACILITY:  [facility]   PHYSICIAN:  Vickki Hearing, M.D.DATE OF BIRTH:  1919/08/24   DATE OF PROCEDURE:  11/14/2008  DATE OF DISCHARGE:                                 PROGRESS NOTE   She has bilateral wrist fractures; left was treated with external  fixation and percutaneous pinning, right was treated with short-arm  cast.  She is in stable condition.  She is constipated and measures have  been instituted to address that.  She appears to be in stable condition  at this time.  We are still working on placement for her for tomorrow.  No further orders from the Orthopedic side.      Vickki Hearing, M.D.  Electronically Signed     SEH/MEDQ  D:  11/14/2008  T:  11/14/2008  Job:  [id number]

## 2011-04-24 NOTE — Op Note (Signed)
Wendy Perkins, Wendy Perkins               ACCOUNT NO.:  [id number]   MEDICAL RECORD NO.:  [id number]          PATIENT TYPE:  AMB   LOCATION:  DAY                           FACILITY:  [facility]   PHYSICIAN:  Vickki Hearing, M.D.DATE OF BIRTH:  1919/09/04   DATE OF PROCEDURE:  04/12/2009  DATE OF DISCHARGE:                               OPERATIVE REPORT   PREOPERATIVE DIAGNOSIS:  Carpal tunnel syndrome of the left upper  extremity.   POSTOPERATIVE DIAGNOSIS:  Carpal tunnel syndrome of the left upper  extremity.   PROCEDURE:  Left carpal tunnel release, open technique.   SURGEON:  Vickki Hearing, MD   ASSISTANTS:  None.   ANESTHETIC:  Regional block.   FINDINGS:  Severe flattening and discoloration of the median nerve, no  space-occupying lesions were noted.  No synovitis.   SPECIMENS:  None.   COUNTS:  Correct.   COMPLICATIONS:  None.   The patient remained stable and returned to PACU in stable condition.   HISTORY:  This 76 year old female had bilateral distal radius fractures  to the left treated with percutaneous pinning and short-arm cast to  right with external fixator.  She developed painful paresthesias in the  left upper extremity, seemed to have carpal tunnel syndrome.  She had a  nerve conduction study, which showed denervation of the musculature as  well as moderate to severe carpal tunnel syndrome and she opted for  surgical treatment.   Informed consent performed in the office.  Alternative treatments  offered included continued splinting injection, oral medication.   PROCEDURE IN DETAIL:  Site marking, patient identification, and chart  update were performed preoperatively in the preop area.  She was taken  to Surgery, had a Bier block, left upper extremity was prepped with  DuraPrep, draped sterilely.  With the hand supinated in a hand aluminum  hand holder, a straight incision was made in line with the radial border  of the ring finger over the left  carpal tunnel.  Subcutaneous tissue was  divided.  The fascia was divided sharply.  Blunt dissection was carried  out distally and underneath the transverse carpal ligament which was  released, the nerve was inspected, found to be flattened and discolored.  No ganglion cysts were noted.  No synovitis was noted.  The wound was  irrigated and closed with 3-0 nylon in interrupted fashion.  Marcaine 10  mL plain 0.25% was injected in the skin, and a sterile bandage was  applied.  The tourniquet was released.  The fingers were viable with  good color and capillary refill.   The patient was taken to the recovery room in stable condition.  She was  discharged on Darvocet-N 100 1-2 q.4 p.r.n. for pain, instructions for  elevation, ice, follow up on Thursday Apr 14, 2009, for dressing change.      Vickki Hearing, M.D.  Electronically Signed     SEH/MEDQ  D:  04/12/2009  T:  04/12/2009  Job:  [id number]

## 2011-04-24 NOTE — Group Therapy Note (Signed)
NAMEMERRIAM, BRANDNER               ACCOUNT NO.:  [id number]   MEDICAL RECORD NO.:  [id number]          PATIENT TYPE:  INP   LOCATION:  A338                          FACILITY:  [facility]   PHYSICIAN:  Edward L. Juanetta Gosling, M.D.DATE OF BIRTH:  06-Aug-1919   DATE OF PROCEDURE:  DATE OF DISCHARGE:                                 PROGRESS NOTE   Ms. Profit is overall doing I think better.  She still has some swelling  of her hand.  She is complaining of some pain in her right eye, but  otherwise she is doing well.   Her exam shows temperature 97.2, pulse 74, respirations are 18, blood  pressure 147/88, O2 sats 97% on 2 L.  Her chest is clear.  Her oxygen  saturations remained good.   ASSESSMENT:  She is overall doing better.   Plan is for her to continue on her meds and treatments and PT, etc., and  I will continue to follow.  She may need her sutures removed from her  head soon.      Edward L. Juanetta Gosling, M.D.  Electronically Signed     ELH/MEDQ  D:  11/13/2008  T:  11/13/2008  Job:  [id number]

## 2011-04-24 NOTE — Group Therapy Note (Signed)
Wendy Perkins, [name]               ACCOUNT NO.:  [id number]   MEDICAL RECORD NO.:  [id number]          PATIENT TYPE:  INP   LOCATION:  A338                          FACILITY:  [facility]   PHYSICIAN:  Edward L. Juanetta Gosling, M.D.DATE OF BIRTH:  1918/12/18   DATE OF PROCEDURE:  11/10/2008  DATE OF DISCHARGE:                                 PROGRESS NOTE   Wendy Perkins is better as far as pain control is concerned, but she has  gotten fairly drowsy with the 0.5 mg dose of Dilaudid.  She is scheduled  for surgery now, tomorrow.  Otherwise, she seems to be doing better.  She has not had any pain medication since about 3 o'clock this morning  and overall seems to have improved.   PHYSICAL EXAMINATION:  GENERAL:  She is awake.  She is arousable, but  she is drowsy.  VITAL SIGNS:  Her O2 sat is running in the 96% range.  Temperature 98.3,  pulse 68, respirations 20, and blood pressure 129/59.  CHEST:  Clear.   ASSESSMENT:  She is, I think, better, but I am going to cut back again  on the dose of Dilaudid and see how she does.      Edward L. Juanetta Gosling, M.D.  Electronically Signed     ELH/MEDQ  D:  11/10/2008  T:  11/10/2008  Job:  [id number]

## 2011-04-24 NOTE — Group Therapy Note (Signed)
NAMEJAMERIAH, Wendy Perkins               ACCOUNT NO.:  [id number]   MEDICAL RECORD NO.:  [id number]          PATIENT TYPE:  INP   LOCATION:  A338                          FACILITY:  [facility]   PHYSICIAN:  Edward L. Juanetta Gosling, M.D.DATE OF BIRTH:  1919/06/14   DATE OF PROCEDURE:  DATE OF DISCHARGE:                                 PROGRESS NOTE   Ms. Miguez is, overall, much improved.  She did have her surgery  yesterday.  Her oxygenation has been excellent.  She has no other new  complaints.   Her exam shows her temperature is 99.3, pulse 87, respirations 20, blood  pressure 125/50, and O2 sats 95-98%.  Her chest is clear.  She does  complain that her feet hurt, but she does not have any bruising.  No  obvious injury.   ASSESSMENT:  She seems to be doing better postoperatively.   PLAN:  To continue with the treatments and follow.      Edward L. Juanetta Gosling, M.D.  Electronically Signed     ELH/MEDQ  D:  11/12/2008  T:  11/12/2008  Job:  [id number]

## 2011-04-24 NOTE — Group Therapy Note (Signed)
[name], Wendy Perkins               ACCOUNT NO.:  [id number]   MEDICAL RECORD NO.:  [id number]         PATIENT TYPE:  PINP   LOCATION:  A338                          FACILITY:  [facility]   PHYSICIAN:  Vickki Hearing, M.D.DATE OF BIRTH:  [birth year]   DATE OF PROCEDURE:  11/15/2008  DATE OF DISCHARGE:                                 PROGRESS NOTE   She is postop from a left distal radius fracture and a right distal  radius fracture.  She had a left external fixator with percutaneous pins  and a right percutaneous pin and cast.  She is in stable condition.  Vital signs are stable.  Neurovascular exam remains intact.  Her only  recent problems have been with constipation, which we are addressing.   We are still awaiting bed placement for her for skilled nursing.  We are  monitoring her progress with her fractures.  She does ambulate with  assistance and bathroom with assistance.      Vickki Hearing, M.D.  Electronically Signed     SEH/MEDQ  D:  11/15/2008  T:  11/15/2008  Job:  [id number]

## 2011-04-24 NOTE — Group Therapy Note (Signed)
NAMECHANTAY, WHITELOCK               ACCOUNT NO.:  [id number]   MEDICAL RECORD NO.:  [id number]          PATIENT TYPE:  INP   LOCATION:  A338                          FACILITY:  [facility]   PHYSICIAN:  Edward L. Juanetta Gosling, M.D.DATE OF BIRTH:  16-Feb-1919   DATE OF PROCEDURE:  DATE OF DISCHARGE:                                 PROGRESS NOTE   Ms. Kiss has been having trouble with constipation.  Otherwise, things  going well.  Pain is well controlled and she has had no other new  complaints.  She does have some swelling of her legs.   Her physical examination shows her temperature is 97.5, her pulse is 75,  respirations 20, blood pressure 142/72, and O2 sats 95% on 2 L.  Her  chest is clear.  She has trace edema of the leg.  Her heart is regular.  Her sutures on her face appeared about ready to come out.   ASSESSMENT:  She is better.   PLAN:  I am going to discontinue the sutures on her face.  I am going to  order Colace, and then, she can have a laxative suppository and enema  depending on as-needed.  Going to get the sutures out, going to give one  dose of Lasix, and I think we can discontinue the continuous O2 sat now.  I do not plan to change anything else.      Edward L. Juanetta Gosling, M.D.  Electronically Signed     ELH/MEDQ  D:  11/14/2008  T:  11/14/2008  Job:  [id number]

## 2011-04-24 NOTE — Discharge Summary (Signed)
NAMEMARISELDA, Wendy Perkins               ACCOUNT NO.:  [id number]   MEDICAL RECORD NO.:  [id number]          PATIENT TYPE:  INP   LOCATION:  A338                          FACILITY:  [facility]   PHYSICIAN:  Vickki Hearing, M.D.DATE OF BIRTH:  10-05-1919   DATE OF ADMISSION:  11/08/2008  DATE OF DISCHARGE:  LH                               DISCHARGE SUMMARY   DATE OF DISCHARGE:  To be determined.   ADMITTING DIAGNOSIS:  Bilateral distal radius fractures.   DISCHARGE DIAGNOSIS:  Bilateral distal radius fractures.   PROCEDURE:  Percutaneous pinning of the right wrist with application of  short-arm cast, closed reduction percutaneous pinning and external  fixation left wrist.  Date of surgery:  November 11, 2008.   HOSPITAL COURSE:  The patient had an uneventful hospital course.  Her  major problems include inability to bath, feed and toilet.  She has been  able to ambulate with standby assist.   MEDICATION:  1. Restasis eye drops OU t.i.d.  2. Colace 100 mg b.i.d.  3. Hydrochlorothiazide 12.5 mg p.o. daily.  4. Multivitamin one daily by mouth.  5. Benicar 40 mg p.o. daily.  6. Protonix 40 mg p.o. q. 12.  7. Spiriva 18 mcg inhaler daily.  8. Tylenol 1000 mg p.o. q.8 h p.r.n. for pain.  9. Dulcolax suppository 10 mg per rectum daily as needed for      constipation.  10.Milk of magnesia 30 mL p.o. daily p.r.n. constipation.  11.Darvocet one tablet p.o. q.4 h p.r.n. for pain.   ALLERGIES:  SULFA.   COMORBIDITIES:  1. Hypertension.  2. GERD.  3. Chronically dry eyes.  4. She is status post bilateral knee replacements.  5. She had a cardiology workup in May 2009 for shortness of breath and      hypertension, was much noted to have moderate LVH at that time.  No      evidence of pulmonary hypertension was noted.  6. As far as physical therapy is concerned.  The patient needs maximum      assistance to eat, toilet and bath.  .  7. She also needs passive range of motion in both hands  with      occupational therapy and activities of daily living training.   DISPOSITION:  To a skilled nursing facility and condition on discharge  is stable.      Vickki Hearing, M.D.  Electronically Signed     SEH/MEDQ  D:  11/16/2008  T:  11/16/2008  Job:  [id number]
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
## 2011-04-24 NOTE — H&P (Signed)
NAMEKAYDAN, WILHOITE               ACCOUNT NO.:  [id number]   MEDICAL RECORD NO.:  [id number]          PATIENT TYPE:  INP   LOCATION:  A338                          FACILITY:  [facility]   PHYSICIAN:  Vickki Hearing, M.D.DATE OF BIRTH:  [birth year]   DATE OF ADMISSION:  11/08/2008  DATE OF DISCHARGE:  LH                              HISTORY & PHYSICAL   CHIEF COMPLAINT:  Bilateral wrist pain and laceration of forehead, left  shoulder pain.   HISTORY:  An 77 year old female fell on November 08, 2008.  Was brought  to the emergency room with bilateral wrist fractures, laceration over  the forehead.  Denied loss of consciousness.  Mechanism of injury:  She  stumbled over a large rock in her yard, fell forward.  She reported 6/10  pain.  She was bleeding from the forehead from a laceration.  She had  large areas of subcutaneous bleeding around the eyes.   She has a history of hypertension and arthritis as well as GERD.  She  has chronic dry eyes.  She has had bilateral knee replacements.  She  lives alone.  She was evaluated by cardiology in May of 2009 for  shortness of breath and hypertension.  She was noted to have moderate  LVH at that time, no evidence of pulmonary hypertension was noted at  that time, and her overall workup was negative.   REVIEW OF SYSTEMS:  She does get short of breath at times after  ambulation.  She again lives at home.  She has 2 sons who help in her  care.  She is independent with her activities of daily living.   FAMILY HISTORY:  Noncontributory.   She is allergic to SULFONAMIDES.   HOME MEDICATIONS:  1. A medicine for blood pressure.  2. Aspirin.  3. A medicine for reflux.  4. Eye drops.   VITAL SIGNS:  Temperature 98.4, pulse 67, respiratory rate 20, blood  pressure 136/56, room air sat 91% to 98%.  OVERALL APPEARANCE:  She is normal in development, grooming, hygiene.  She is lying comfortably in bed with bilateral wrist splints.  Most  notable  is ecchymosis surrounding both eyes and the laceration over the  forehead. CARDIOVASCULAR:  Reveals no peripheral edema.  EXTREMITIES:  Warm to touch.  Dorsalis pedis, posterior tibial pulses  are normal.  Capillary refill and perfusion at both hands normal.  SKIN:  Laceration over the forehead seems to be about 2.5, 3 cm.  Ecchymosis noted around the eyes bilaterally.  Skin under the splints  could not be evaluated.  There was an abrasion over the right knee.  She  has 2 incisions for total knee replacements.  She was awake and alert.  She was oriented to person, place, and time.  Mood was normal.  LYMPHATIC:  Cervical lymph nodes were normal, supraclavicular lymph  nodes were normal.  Axillary lymph nodes normal as well.  LOWER EXTREMITIES:  She had normal passive range of motion in the hip,  knee, and ankle bilaterally.  No deformities were noted in the lower  extremities.  Muscle tone  was normal.  There were no deformities noted.  UPPER EXTREMITIES:  Bilateral wrist fractures noted.  She is in splints.  Fingers are moving.  Capillary refill again was good.  Sensation was  normal.  OTHER NEUROLOGICAL FINDINGS:  There were no focal findings noted.   PREOPERATIVE WORKUP:  Left shoulder x-ray normal except for some  degenerative changes of the [facility] joint.   CT of the head and neck show chronic degenerative changes.  No acute  disease.  There are multilevel degenerative changes with grade 1  retrolisthesis at C4-5, anterolisthesis C5-6, minimal retrolisthesis C6-  7.  No prevertebral soft tissue swelling.  No maxillofacial fractures  were noted.  Head CAT scan showed stable atrophy, chronic small vessel  white matter ischemic changes.  No acute abnormality.  CT cervical spine  as stated:  Left wrist dorsal displacement and angulation of the distal  radius, right wrist nondisplaced fracture distal radius.   OVERALL IMPRESSION:  This an 77 year old female with bilateral wrist  fractures  and a laceration on her forehead who is noted to have  hypertension and lives alone.   She will have medical consult with Dr. Juanetta Gosling.   If all things are normal, we can do a closed reduction external fixation  percutaneous pinning of the left wrist and to assist with daily care,  ambulation, and activities of daily living a percutaneous pinning and  casting of the right wrist.   LABORATORY RESULTS:  CBC was normal with hemoglobin of 13.1, and the  chem-7 or basic metabolic panel was normal with a glucose of 121, sodium  of 134.      Vickki Hearing, M.D.  Electronically Signed     SEH/MEDQ  D:  11/09/2008  T:  11/09/2008  Job:  [id number]

## 2011-04-24 NOTE — Group Therapy Note (Signed)
NAMEMAKAYLIA, HEWETT               ACCOUNT NO.:  [id number]   MEDICAL RECORD NO.:  [id number]           PATIENT TYPE:   LOCATION:                                 FACILITY:   PHYSICIAN:  Edward L. Juanetta Gosling, M.D.DATE OF BIRTH:  [birth year]   DATE OF PROCEDURE:  DATE OF DISCHARGE:                                 PROGRESS NOTE   Ms. Skorupski is admitted to Dr. Romeo Apple, and he has planned to do surgery  today, but he had an injury and could not do her surgery, so  it is  cancelled.  She is having more problem with pain, and she is having  episodes of being tremulous and it is not clear if that is from her pain  or from the morphine, which she is taking for pain.  Her family states  it is fairly clearly 2 times a day she developed tremulousness and  shaking after taking morphine, so it maybe related to morphine itself.  After some discussion with the family, I think she has actually broken  both wrists, and I think that is not going to work.  I have decided to  switch her to Dilaudid in a low dose 0.5 mg, which we can decrease.  We  are going to go ahead and put a continuous O2 sat on her, and I am going  to ask the nursing staff to check her frequently to see if she needs  more pain medication and to make sure that she does not get too sleepy  from it.      Edward L. Juanetta Gosling, M.D.     ELH/MEDQ  D:  11/09/2008  T:  11/10/2008  Job:  [id number]

## 2011-04-24 NOTE — Procedures (Signed)
[name], [name]               ACCOUNT NO.:  [id number]   MEDICAL RECORD NO.:  [id number]          PATIENT TYPE:  OUT   LOCATION:  RAD                           FACILITY:  [facility]   PHYSICIAN:  Peter C. Eden Emms, MD, FACCDATE OF BIRTH:  [birth year]   DATE OF PROCEDURE:  07/30/2007  DATE OF DISCHARGE:                                ECHOCARDIOGRAM   INDICATIONS:  Check LV function, hypertension, increasing shortness of  breath.   FINDINGS:  Left ventricular cavity size was normal.  There was moderate  LVH.  Septal thickness was 18 mm.  There was some basal septal  hypertrophy.  EF was 60%.  There are no regional wall motion  abnormalities.  Mitral valve was mildly thickened with mild MR.  There  was moderate left atrial enlargement.  Right-sided cardiac chambers were  normal.  There was no evidence of pulmonary hypertension.  There was  mild TR.  Aortic valve was trileaflet and sclerotic.  There was no  stenosis.  Aortic root was normal.  Subcostal imaging revealed no atrial  septal defect.  No source of embolus and no effusion.   M-MODE TRACINGS:  Aortic dimension of 28 mm.  Left atrial dimension 48  mm.  Septal thickness 18 mm.  LV diastolic dimension 42 mm.  LV systolic  dimension 30 mm.   IMPRESSION:  1. Moderate left ventricular hypertrophy with ejection fraction 60%.  2. Mild mitral regurgitation.  3. Moderate left atrial enlargement.  4. Normal right-sided cardiac chambers.  5. Mild tricuspid regurgitation.  6. Aortic valve sclerosis.      Wendy Perkins. Eden Emms, MD, [facility]  Electronically Signed     PCN/MEDQ  D:  07/30/2007  T:  07/31/2007  Job:  [id number]

## 2011-04-24 NOTE — Assessment & Plan Note (Signed)
[facility]                       [place] CARDIOLOGY OFFICE NOTE   Wendy Perkins, Wendy Perkins                      MRN:          [id number]  DATE:04/16/2008                            DOB:          Feb 21, 1948    IDENTIFICATION:  Wendy Perkins is an 77 year old woman with a history of  hypertension and shortness of breath.  She was last seen in cardiology  clinic back in May 2007.  Note, she had a Myoview scan that was done in  June 2007 that showed no significant ischemia, EF of 70%.  She is re-  referred for shortness of breath today.   On talking to the patient and her daughter, she does give out with  activity and actually had some wheezing.  She was referred for further  evaluation.  Denies chest pain.   MEDICATIONS:  1. Aspirin 81.  2. Prilosec 20.  3. Multivitamin.  4. Atacand 16/12.5.  5. Os-Cal.  6. Vitamin E.  7. Vitamin C.  8. Vitamin B6.   PHYSICAL EXAMINATION:  GENERAL:  The patient is in no distress.  VITAL SIGNS:  Blood pressure initially 176/68, on my check 162/64  bilateral arms with both cuffs, pulse 62 and regular.  Weight 192.  LUNGS:  Clear.  No definite wheezes.  CARDIAC:  Regular rate and rhythm, S1-S2, no S3, no murmurs.  ABDOMEN:  Benign.  EXTREMITIES:  1+ edema.   IMPRESSION:  Dyspnea.  The patient has had a negative workup for  symptoms.  Sounds like they have progressed some, but she also notes a  history of wheezing.  I will set the patient up for a cardiopulmonary  stress test to separate lung and cardiac.  Note, she had an  echocardiogram in August of last year that showed normal LV function.  There is some moderate LVH, question if indeed she may have some  diastolic dysfunction.  There was no evidence for pulmonary hypertension  on this test.   I told her to try Zyrtec for now as she does have some allergies which  may be exacerbating some pulmonary issues.  We will set up for the  cardiopulmonary stress  testing and follow up after the test is done.   Her hypertension is not optimally treated by today's count at least.  I  would have her followup closely as an outpatient.  If it indeed is high,  she needs to go up on her medicines.  I will hold for right now.     Pricilla Riffle, MD, [facility]  Electronically Signed    PVR/MedQ  DD: 04/16/2008  DT: 04/16/2008  Job #: [id number]   cc:   Ramon Dredge L. Juanetta Gosling, M.D.

## 2011-04-24 NOTE — Consult Note (Signed)
NAME:  Wendy Perkins, Wendy Perkins            ACCOUNT NO.:  [id number]   MEDICAL RECORD NO.:  [id number]         PATIENT TYPE:  PINP   LOCATION:  A338                          FACILITY:  [facility]   PHYSICIAN:  Edward L. Juanetta Gosling, M.D.DATE OF BIRTH:  [birth year]   DATE OF CONSULTATION:  11/09/2008  DATE OF DISCHARGE:                                 CONSULTATION   Wendy Perkins is an 77 year old, who is in her usual state of pretty good  health, when she stumbled over a rock, fell and landed on both of her  outstretched arms and hit her head.  She had lacerations at her face and  fractured both wrists.  Dr. Romeo Apple has admitted her and intends  surgery later today.  She did not lose consciousness.   PAST MEDICAL HISTORY:  Otherwise is positive for hypertension,  gastroesophageal reflux disease, arthritis, chronic dry eyes, and  chronic shortness of breath, which is felt to be multifactorial.   FAMILY HISTORY:  Positive for depression, anxiety, and some respiratory  problems.   SOCIAL HISTORY:  She is a widow.  She does not smoke.  She does not use  any alcohol or illicit drugs.   MEDICATIONS AT HOME:  1. Aspirin 81 mg daily.  2. Prilosec 20 mg daily.  3. Multiple vitamin daily.  4. Lubricating Eye Drops that she uses 3 times a day.  5. Restasis  eye drops that she uses 3 times a day.  6. Naproxen 500 mg b.i.d.  7. Spiriva inhaler daily.  8. Atacand HCT 32/12.5 daily.   ALLERGIES:  She is allergic to SULFA.   REVIEW OF SYSTEMS:  Otherwise is negative.   PHYSICAL EXAMINATION:  GENERAL:  She has a bruising on her face.  She  has a hematoma and laceration x2 on her forehead.  HEENT:  Her pupils are reactive.  Her nose and throat are clear.  Her  mucous membranes are moist.  NECK:  Supple without masses.  CHEST:  Clear without any wheezes.  HEART:  Regular without gallop.  ABDOMEN:  Soft.  No masses are felt.  Bowel sounds are present and  active.  No organs are palpable.  EXTREMITIES:  No  edema.  The wrists  are both wrapped and then supports.  CNS:  Grossly intact.   CBC shows white count 14,300, hemoglobin 13.1, platelet clumps are seen,  but the platelet count appears to be adequate.  Her sodium is 134 and  glucose 121.   ASSESSMENT:  She has bilateral wrist fractures and she is set for  surgery.  She has not had a chest x-ray or an EKG yet.  Once she has  those done, then I think, she will be okay for planned surgery.   Thanks for allowing me to see her with you.      Edward L. Juanetta Gosling, M.D.  Electronically Signed     ELH/MEDQ  D:  11/09/2008  T:  11/09/2008  Job:  [id number]

## 2011-04-24 NOTE — Group Therapy Note (Signed)
NAMEBRYLAN, SEUBERT               ACCOUNT NO.:  [id number]   MEDICAL RECORD NO.:  [id number]          PATIENT TYPE:  INP   LOCATION:  A338                          FACILITY:  [facility]   PHYSICIAN:  Edward L. Juanetta Gosling, M.D.DATE OF BIRTH:  1919-01-18   DATE OF PROCEDURE:  DATE OF DISCHARGE:  11/16/2008                                 PROGRESS NOTE   The patient of Dr. Romeo Apple with bilaterally fractured wrists, chronic  shortness of breath, and hypertension.   SUBJECTIVE:  Ms. Graf states that she is better.  She does not have  any new complaints and she is hopeful of being discharged later today.   PHYSICAL EXAMINATION:  VITAL SIGNS:  Temperature is 98.4, pulse is 75,  respirations 20, blood pressure is 156/78, and O2 sats 94% on room air.  GENERAL:  She looks comfortable.  CHEST:  Clear.  HEART:  Regular.   ASSESSMENT:  She has fractured wrists bilaterally, stable.  She has  shortness of breath, which is multifactorial.  She has hypertension,  which is well controlled, and I do not plan to change any treatments or  medications today.  Hopefully, she will be able to be discharge.      Edward L. Juanetta Gosling, M.D.  Electronically Signed     ELH/MEDQ  D:  11/16/2008  T:  11/16/2008  Job:  [id number]

## 2011-04-24 NOTE — Op Note (Signed)
NAMELILYMAE, Wendy Perkins               ACCOUNT NO.:  [id number]   MEDICAL RECORD NO.:  [id number]         PATIENT TYPE:  PINP   LOCATION:  A338                          FACILITY:  [facility]   PHYSICIAN:  Vickki Hearing, M.D.DATE OF BIRTH:  12-Aug-1919   DATE OF PROCEDURE:  11/11/2008  DATE OF DISCHARGE:                               OPERATIVE REPORT   HISTORY:  An 77 year old female fell and fractured her left and right  wrists, sustained a laceration over her forehead.  She was worked up in  the emergency room on 30th for these injuries.  CT scan of the head and  neck were normal.  She had a displaced left distal radius fracture and a  nondisplaced right distal radius fracture.  She was admitted.  Medical  consult was obtained with Dr. Juanetta Gosling.  She was cleared for surgery.   The patient's surgery was delayed by an injury to the treating physician  and surgeon.   PREOPERATIVE DIAGNOSIS:  Closed right and left distal radius fractures.   POSTOPERATIVE DIAGNOSIS:  Closed right and left distal radius fractures.   PROCEDURE:  Closed reduction external fixation of the right wrist with  percutaneous pinning and percutaneous pinning of the left wrist with  short-arm cast.   SURGEON:  Vickki Hearing, MD   ANESTHETIC:  General.   OPERATIVE FINDINGS:  The right fracture was nondisplaced.  The left  fracture was displaced with classic dinner fork deformity of a Colles  injury.   The patient's arms were both marked and the surgical procedure was  marked on each arm.  The patient was taken to surgery, given Ancef,  intubated, and the left upper extremity was prepped with DuraPrep,  draped sterilely.   Time-out procedure was initiated and completed.   Closed reduction was performed under C-arm guidance.  A pin was used to  stabilize the fracture and an external fixator was applied using  standard technique.  We made an incision on the second metacarpal,  divided subcutaneous tissue,  and blunt dissection was carried out down  to the bone to half pins were placed and confirmed on x-ray.  This was  then repeated on the mid to distal forearm at the radius and the first  pin was removed, the reduction was repeated, and a second 0.62 K-wire  was placed to stabilize the fracture under C-arm guidance and this  looked excellent.   Fixator was then placed and radiographs were repeated.   The wounds closed with 3-0 nylon after irrigation.   After dressings were applied, we then addressed the right wrist.   On the right side after prep and drape and time-out, two 0.62 K-wires  were placed under C-arm guidance and then a short-arm cast was applied.   The patient was extubated, taken to recovery room in stable condition.   The patient was full weightbearing on both fractures.  He can start some  finger range of motion exercises.  The patient will need skilled nursing  care and there appears to be a [facility] available at the [facility].      Bear Stearns  Elita Quick, M.D.  Electronically Signed     SEH/MEDQ  D:  11/11/2008  T:  11/11/2008  Job:  [id number]

## 2011-04-24 NOTE — Group Therapy Note (Signed)
NAME:  LINDEN, MIKES            ACCOUNT NO.:  [id number]   MEDICAL RECORD NO.:  [id number]         PATIENT TYPE:  PINP   LOCATION:  A338                          FACILITY:  [facility]   PHYSICIAN:  Edward L. Juanetta Gosling, M.D.DATE OF BIRTH:  08/11/1919   DATE OF PROCEDURE:  11/10/2008  DATE OF DISCHARGE:                                 PROGRESS NOTE   Ms. Kocher is admitted to Dr. Romeo Apple, and he has planned to do surgery  today, but he had an injury and could not do her surgery, so  it is  cancelled.  She is having more problem with pain, and she is having  episodes of being tremulous and it is not clear if that is from her pain  or from the morphine, which she is taking for pain.  Her family states  it is fairly clearly 2 times a day she developed tremulousness and  shaking after taking morphine, so it maybe related to morphine itself.  After some discussion with the family, I think she has actually broken  both wrists, and I think that is not going to work.  I have decided to  switch her to Dilaudid in a low dose 0.5 mg, which we can decrease.  We  are going to go ahead and put a continuous O2 sat on her, and I am going  to ask the nursing staff to check her frequently to see if she needs  more pain medication and to make sure that she does not get too sleepy  from it.      Edward L. Juanetta Gosling, M.D.  Electronically Signed     ELH/MEDQ  D:  11/09/2008  T:  11/10/2008  Job:  [id number]

## 2011-04-27 NOTE — Procedures (Signed)
NAMEKEILAH, Wendy Perkins               ACCOUNT NO.:  [id number]   MEDICAL RECORD NO.:  [id number]          PATIENT TYPE:  OUT   LOCATION:  RESP                          FACILITY:  [facility]   PHYSICIAN:  Edward L. Juanetta Gosling, M.D.DATE OF BIRTH:  14-Jun-1919   DATE OF PROCEDURE:  DATE OF DISCHARGE:  08/25/2007                            PULMONARY FUNCTION TEST   IMPRESSION:  1. Spirometry is normal.  2. Lung volumes are normal.  3. Diffusion capacity of the lungs is normal.  4. Arterial blood gases are normal.      Edward L. Juanetta Gosling, M.D.  Electronically Signed     ELH/MEDQ  D:  08/27/2007  T:  08/27/2007  Job:  [id number]

## 2011-04-27 NOTE — Procedures (Signed)
NAMERHODIA, ACRES               ACCOUNT NO.:  [id number]   MEDICAL RECORD NO.:  [id number]          PATIENT TYPE:  OUT   LOCATION:  RAD                           FACILITY:  [facility]   PHYSICIAN:  Parker Bing, M.D. [facility] OF BIRTH:  22-Apr-1919   DATE OF PROCEDURE:  12/14/2005  DATE OF DISCHARGE:                                  ECHOCARDIOGRAM   REFERRING:  Dr. Juanetta Gosling.   CLINICAL DATA:  An 77 year old woman with dyspnea and hypertension.   M-MODE:  Aorta 2.8, left atrium 4.8, septum 1.5, posterior wall 1.2, LV  diastole 4.5, LV systole 2.9.   1.  Technically adequate echocardiographic study.  2.  Mild right atrial enlargement; mild to moderate left atrial enlargement.  3.  Normal right ventricular size; mild hypertrophy; normal function.  4.  Slight mitral valve thickening with mild annular calcification and very      mild regurgitation.  5.  Normal tricuspid valve; physiologic regurgitation; mild elevation in      estimated RV systolic pressure.  6.  Mild sclerosis of a trileaflet aortic valve; normal function.  7.  Normal pulmonic valve and proximal pulmonary artery.  8.  Normal left ventricular size; mild to moderate hypertrophy; hyperdynamic      regional and global function.  9.  Normal IVC.  10. Comparison with prior study of October 12, 2002:  Interval left and      right atrial enlargement; increase in LVH.      Methow Bing, M.D. [facility]  Electronically Signed     RR/MEDQ  D:  12/14/2005  T:  12/14/2005  Job:  [id number]

## 2011-04-27 NOTE — Op Note (Signed)
NAME:  Wendy Perkins, Wendy Perkins                         ACCOUNT NO.:  [id number]   MEDICAL RECORD NO.:  [id number]                   PATIENT TYPE:  INP   LOCATION:  5031                                 FACILITY:  [facility]   PHYSICIAN:  Claude Manges. Cleophas Dunker, M.D.            DATE OF BIRTH:  1919-05-25   DATE OF PROCEDURE:  04/25/2004  DATE OF DISCHARGE:                                 OPERATIVE REPORT   PREOPERATIVE DIAGNOSIS:  End stage osteoarthritis, left knee.   POSTOPERATIVE DIAGNOSIS:  End stage osteoarthritis, left knee.   PROCEDURE:  Left total knee replacement.   SURGEON:  Claude Manges. Cleophas Dunker, M.D.   ASSISTANT:  Legrand Pitts. Duffy, P.A.-C.   ANESTHESIA:  General orotracheal with supplemented femoral nerve block.   COMPLICATIONS:  None.   COMPONENTS:  DePuy LCS standard femoral keel rotating tibial tray #3 with a  12.5 mm bridging polyethylene bearing and a three peg metal back standard  patella, all were secured with polymethyl methacrylate.   PROCEDURE:  With the patient comfortable on the operating table and under  general endotracheal anesthesia, the nursing staff inserted a Foley  catheter.  A thigh tourniquet was then applied to the left lower extremity.  The leg was then prepped with DuraPrep from the tourniquet to the mid foot,  sterile draping was performed.  With the extremity still elevated, it was  Esmarch exsanguinated, with a proximal tourniquet at 350 mmHg.  A midline  longitudinal incision was made centered about the patella and extending from  the superior pouch to the tibial tubercle by sharp dissection, the incision  was carried down to the subcutaneous tissue.  The first layer of capsule was  incised in the midline.  A medial parapatellar incision was made in a  straight line through the deep capsule with the Bovie.  There was a clear,  yellow joint effusion.  The patella was easily everted 180 degrees laterally  and the knee flexed to 90 degrees.  There were large  osteophytes along the  medial and lateral femoral condyle and the patella with absence of articular  cartilage, particularly along the medial compartment.  The osteophytes were  removed where we had templated a standard femoral component preoperatively  and this was confirmed interoperatively.   The initial cut was made on the proximal tibia where there was a 7 degree  posterior inclination.  Femoral cuts were then made with the standard  femoral component using a 4 degree distal femoral valgus cut.  Medial and  lateral collateral ligaments remained intact, the ACL and PCL were  sacrificed.  Laminar spreaders were inserted to remove medial and lateral  menisci as well as osteophytes from the posterior, medial and lateral  femoral condyle as well as the medial tibial plateau.  Synovectomy was also  performed.  Final cuts were then made on the femur with an excellent fit wit  the trial component.  The retractor  was then placed behind the tibia  carefully.  The center cut was then made to accept the keeled portion of the  tibia.  The trial components were then inserted.  A 12.5 mm flexion  extension gap were symmetrical throughout the procedure.  This was confirmed  using the 12.5 mm bridging bearing.  We had very little opening with varus  or valgus stress.  We had full extension as the patient had flexion  contracture preoperatively and flexion without any mal-motion of the tibial  or femoral components or the polyethylene component.   The patella was prepared by removing the osteophytes and removing 10 mm of  bone corresponding to the thickness of the patellar component leaving 14 mm  patellar thickness.  The tripeg jig was then applied and the holes made and  the trial patella applied.  The knee was placed through a full range of  motion with all of the trial components without subluxation of the patella.  The trial components were removed.  The joint was then copiously irrigated  with  jet saline.  The final components were then secured with polymethyl  methacrylate including the #3 keeled rotating tibial platform, a 12.5 mm  polyethylene bearing, and a standard femoral component.  Excess methacrylate  was removed.  The knee was placed in extension and the patella applied with  a methacrylate and a patellar clamp.  After complete maturation of the  methacrylate, the joint was inspected and any trace methacrylate was removed  with an osteotome.  The tourniquet was deflated, gross bleeders were Bovie  coagulated.  The wound was again irrigated with saline solution.  We had a  very nice dry field and excellent capillary refill to the skin edges.   The deep capsule was closed with interrupted #1 Ethibond, the superficial  capsule was closed with a running 0 Vicryl, the subcu was closed with a  running 2-0 Vicryl, and the skin was closed with skin clips.  A sterile,  bulky dressing was applied followed by the patient's support stocking.  The  patient tolerated the procedure without complications.                                               Claude Manges. Cleophas Dunker, M.D.    PWW/MEDQ  D:  04/25/2004  T:  04/25/2004  Job:  [id number]

## 2011-04-27 NOTE — Discharge Summary (Signed)
NAME:  Wendy Perkins, HEWARD                         ACCOUNT NO.:  [id number]   MEDICAL RECORD NO.:  [id number]                   PATIENT TYPE:  IPS   LOCATION:  4146                                 FACILITY:  [facility]   PHYSICIAN:  Claude Manges. Cleophas Dunker, M.D.            DATE OF BIRTH:  December 30, 1918   DATE OF ADMISSION:  04/28/2004  DATE OF DISCHARGE:  05/04/2004                                 DISCHARGE SUMMARY   ADMITTING DIAGNOSES:  1. End-stage osteoarthritis of the left knee.  2. Uncontrolled hypertension.  3. Hiatal hernia.  4. Esophageal strictures.   DISCHARGE DIAGNOSES:  1. Status post left total knee arthroplasty.  2. Hypokalemia, being treated.  3. Urinary tract infection preoperatively, resolved.  4. Acute blood loss anemia secondary to surgery, asymptomatic.  5. Hypertension.  6. Hiatal hernia.  7. Esophageal strictures.   ALLERGIES:  SULFA.   MEDICATIONS:  1. Baby aspirin one p.o. daily.  2. Advil three times a day.  3. Multivitamin one daily.  4. Prilosec 20 mg p.o. daily.   SURGICAL PROCEDURE:  The patient was taken to the operating room on Apr 25, 2004 by Dr. Cleophas Dunker and assisted by Legrand Pitts. Duffy, P.A.C.  The patient  was placed under general anesthesia, and also received a preoperative  femoral nerve block.  Left total knee replacement was performed using the  following components - DePuy LCS standard femoral __________ rotating tibial  tray, #3, with a 12.5 mm bridging polyethelene bearing and the three-pegged  metal-backed standard patella.  The patient tolerated the procedure well,  and returned to recovery in good stable condition.   CONSULTS:  The following consults were obtained while the patient was  hospitalized -  1. PT.  2. OT.  3. Pharmacy.  4. Case management.  5. Rehabilitation.   HOSPITAL COURSE:  The patient developed acute blood loss anemia secondary to  surgery; however, remained asymptomatic.  The day of transfer to  rehabilitation, the  patient's hemoglobin was 9.9, hematocrit 29.  She also  developed hypokalemia, which was being treated; however, it had not resolved  at the time of discharge to rehabilitation.  The patient's T-max during the  hospital stay was 101.3, and vital signs remained stable throughout the  hospital course.  On postoperative day #3, the patient was transferred to  rehabilitation.  She was afebrile.   Lower extremity Doppler performed on May 01, 2004 showed no evidence of DVT,  superficial thrombosis, or Baker's cyst bilaterally.   LABORATORY DATA:  Routine labs on admission revealed CBC values all within  normal limits.  Coags on admission were within normal limits.  Routine  chemistries on admission revealed potassium was 3.4, glucose 106.  Otherwise, all other values were within normal limits.  Hepatic enzymes were  within normal limits on admission.  Urinalysis on admission, Apr 27, 2004,  was negative.  At the time of discharge, the patient's hemoglobin was  9.9,  hematocrit 29.0.  PT was 15.8, INR was 1.4.  Potassium was 3.3.   EKG performed on Apr 12, 2004 showed normal sinus rhythm with a heart rate of  62 beats per minute, PR interval 162 ms, PRT axis 45, -26, 66.   Chest x-ray - two views of the chest performed on January 05, 2004 showed no  active disease.  There were some hyperaerated areas that were consistent  with COPD.  Borderline cardiomegaly.   Two views of left knee performed on Apr 25, 2004 preoperatively performed  showed left knee with marked medial joint space narrowing and moderate spur  formation involving all 3 compartments.   DISCHARGE INSTRUCTIONS:   MEDICATIONS:  1. Reglan 10 mg IV q.6h. p.r.n. nausea.  2. Zofran 1 mg p.r.n. nausea q.6h.  3. Benadryl 12.5 to 25 mg IV p.o. q.6h. p.r.n. itching.  4. Percocet 5 mg two tablets p.o. q.4-6h. p.r.n. pain.  5. Colace 100 mg p.o. b.i.d.  6. Senokot one tablet p.o. b.i.d. a.c.  7. Enema of choice one p.r.n.  constipation.  8. Warfarin, pharmacy protocol, for a total of 30 days postoperative.  9. Robaxin 500 mg p.o. q.6-8h. p.r.n. spasms.  10.      Restoril 15 mg p.o. q.h.s. p.r.n. insomnia.  11.      Lisinopril 20 mg p.o. daily given with HCTZ 25 mg p.o. daily.  12.      Protonix 40 mg p.o. daily.  13.      Multivitamin one tablet p.o. daily.  14.      Potassium chloride 40 mEq p.o. b.i.d.   ACTIVITY:  The patient is partial weightbearing, 50% weight on left leg.   DIET:  Advance as tolerated.   CONDITION ON DISCHARGE:  The patient was discharged to rehabilitation in  good stable condition on postoperative day #3.   FOLLOW UP:  The patient will need follow up with Dr. Cleophas Dunker in his office  14 days from surgery.  At that time, staples will be removed and x-rays  performed.   WOUND CARE:  The patient is to keep the wound clean and dry.  May shower  after 2 days of no drainage.  The patient is to call the office at [phone]  if any of the following develop - a temperature of greater than 101.5,  swelling, foul-smelling drainage, or pain not controlled with pain  medications.   SPECIAL INSTRUCTIONS:  CPM 0-90 for 6-8 hours a day; increase by 10 degrees  daily.      Richardean Canal, P.A.                       Claude Manges. Cleophas Dunker, M.D.    GC/MEDQ  D:  05/23/2004  T:  05/23/2004  Job:  [id number]

## 2011-04-27 NOTE — Discharge Summary (Signed)
NAME:  ERNESTYNE, CALDWELL                         ACCOUNT NO.:  [id number]   MEDICAL RECORD NO.:  [id number]                   PATIENT TYPE:  IPS   LOCATION:  4146                                 FACILITY:  [facility]   PHYSICIAN:  Erick Colace, M.D.           DATE OF BIRTH:  1919-05-15   DATE OF ADMISSION:  04/28/2004  DATE OF DISCHARGE:  05/04/2004                                 DISCHARGE SUMMARY   DISCHARGE DIAGNOSES:  1. Left total knee replacement secondary to degenerative joint disease, May     17.  2. Pain management.  3. Coumadin for deep vein thrombosis prophylaxis.  4. Hypokalemia, resolving.  5. Hypertension.  6. Gastroesophageal reflux disease.  7. History of esophageal strictures.   HISTORY OF PRESENT ILLNESS:  This is an 77 year old white female admitted  May 17 with end-stage changes of the left knee and no relief with  conservative care.  She underwent a left total knee replacement on May 17  per Dr. Cleophas Dunker.  Placed on Coumadin for deep vein thrombosis prophylaxis  and partial weightbearing.  Postoperative pain management with PCA morphine  discontinued May 19.  Hypokalemia 2.9 with supplement added.  She is minimal  assist for bed mobility and transfers, admitted for a comprehensive rehab  program.   PAST MEDICAL HISTORY:  See discharge diagnoses.   PAST SURGICAL HISTORY:  Hysterectomy.   ALLERGIES:  SULFA.   SOCIAL HISTORY:  Denies alcohol or tobacco.   PRIMARY MEDICAL DOCTOR:  Edward L. Juanetta Gosling, M.D.   MEDICATIONS PRIOR TO ADMISSION:  Aspirin, Prilosec, Lisinopril, and  hydrochlorothiazide.   SOCIAL HISTORY:  Lives alone in [place].  Independent prior to admission.  One-level home.  One step to entry.  Daughter in area works.   HOSPITAL COURSE:  The patient did well while in rehab services, with  therapies initiated on a b.i.d. basis.  The following issues were followed  during patient's rehab course.  Pertaining to Ms. Tuch' left total  knee  replacement, surgical site healing nicely.  Neurovascular sensation remained  intact.  She was partial weightbearing, to follow up with the orthopedic  service of Dr. Cleophas Dunker, knee flexion, of 0 degrees.  Pain control with the  use of oxycodone and good results.  She remained on Coumadin for deep vein  thrombosis prophylaxis with latest INR of 2.0, with complete Coumadin  protocol followed by [place] home health agency.  Blood pressures controlled  with Lisinopril and hydrochlorothiazide, potassium monitored while on  diuretic with latest potassium levels of 4.2 and supplement ongoing.  During  her rehab course, she did have an E. Coli urinary tract infection treated  with amoxicillin x 7 days.  She continued her Prilosec with no  gastrointestinal complaints.  Overall, for her functional mobility, she was  close supervision for ambulation greater than 140 feet, supervision for  activities of daily living.   Latest labs showed an INR of 2.0, hemoglobin  of 10.8, hematocrit 32.  Latest  chemistries with a sodium of 135, potassium 4.2, BUN 11, creatinine 0.7.  She did receive a venous Doppler study through her rehab course that was  negative.   DISCHARGE MEDICATIONS AT TIME OF DICTATION:  1. Coumadin, latest dose of 8 mg on May 24, adjusted accordingly to be     completed on May 26, 2004, and patient to resume aspirin after Coumadin     completed.  2. Potassium chloride 20 mEq daily.  3. Hydrochlorothiazide 25 mg daily.  4. Multivitamin daily.  5. Lisinopril 20 mg daily.  6. Amoxicillin 250 mg three times daily until May 06, 2004, then stop.  7. Prilosec 20 mg daily.  8. Oxycodone as needed for pain.   ACTIVITY:  Partial weightbearing with walker.   DIET:  Regular.   WOUND CARE:  Clean incision daily with warm soap and water.   SPECIAL INSTRUCTIONS:  Home health, physical and occupational therapy.  Home  health nurse per [facility] to complete Coumadin  protocol.  She  should follow up with Dr. Cleophas Dunker, orthopedic services, Dr. Kari Baars, medical management.      Mariam Dollar, P.A.                     Erick Colace, M.D.    DA/MEDQ  D:  05/03/2004  T:  05/04/2004  Job:  [id number]   cc:   Claude Manges. Cleophas Dunker, M.D.  [address] [place]  [place] [address]  Fax: [phone]   [name]. Juanetta Gosling, M.D.  [address]  [place]  [place] [address]  Fax: [phone]

## 2011-04-27 NOTE — Procedures (Signed)
Wendy Perkins, Wendy Perkins               ACCOUNT NO.:  [id number]   MEDICAL RECORD NO.:  [id number]          PATIENT TYPE:  OUT   LOCATION:  RAD                           FACILITY:  [facility]   PHYSICIAN:  Vida Roller, M.D.   DATE OF BIRTH:  25-Jun-1919   DATE OF PROCEDURE:  05/10/2006  DATE OF DISCHARGE:                                  ECHOCARDIOGRAM   TAPE NUMBER:  [id number].   TAPE COUNT:  [id number].   HISTORY OF PRESENT ILLNESS:  This is an 77 year old woman with chest pain  and shortness of breath.  Technical quality of the study is adequate.   M-MODE TRACINGS:  Aorta 29 mm.   Left atrium 47 mm.   The septum is 18 mm.   Posterior wall 16 mm.   Left ventricular diastolic dimension 42 mm.   Left ventricular systolic dimension 31 mm.   2-D AND DOPPLER IMAGING:  Left ventricle is normal size.  There is preserved  LV systolic function with ejection fraction of 65-70%.  There are no wall  motion abnormalities.  There is moderate concentric left ventricular  hypertrophy.   Right ventricle is normal size with normal systolic function.   Both atria are dilated.   The aortic valve is sclerotic with no stenosis or regurgitation.   The mitral valve has mild regurgitation.   Tricuspid valve with trivial regurgitation.   No pericardial effusion.  The inferior vena cava appears to be small.      Vida Roller, M.D.  Electronically Signed     JH/MEDQ  D:  05/10/2006  T:  05/10/2006  Job:  [id number]   cc:   Ramon Dredge L. Juanetta Gosling, M.D.  Fax: [phone]

## 2011-04-27 NOTE — H&P (Signed)
NAME:  Wendy Perkins, Wendy Perkins                         ACCOUNT NO.:  [id number]   MEDICAL RECORD NO.:  [id number]                   PATIENT TYPE:  INP   LOCATION:  NA                                   FACILITY:  [facility]   PHYSICIAN:  Claude Manges. Cleophas Dunker, M.D.            DATE OF BIRTH:  Apr 29, 1919   DATE OF ADMISSION:  04/18/2004  DATE OF DISCHARGE:                                HISTORY & PHYSICAL   CHIEF COMPLAINT:  Left knee pain significantly worsened over the last year  and a half.   HISTORY OF PRESENT ILLNESS:  The patient is an 77 year old white female with  a significant history of progressively worsening left knee pain over the  last year and a half.  Her biggest concern is that is constantly giving way,  causing her to fall.  Fortunately, up to this point, she has been able to  catch herself.  She describes a deep aching sensation within the knee with  no radiation.  The pain does wake her at night.  She does have significant  stiffness in the knee; she does have swelling; she does have popping,  grinding and catching.  The patient denies any previous injury to the knee.   ALLERGIES:  SULFA.   CURRENT MEDICATIONS:  1. Baby aspirin 1 tablet a day.  2. Advil 3 times a day.  3. Multivitamin.  4. Prilosec 20 mg p.o. daily.   PAST MEDICAL HISTORY:  The patient only initially admitted to hiatal hernia.  She has also had an esophageal stricture stretching x2, the last being 2  years previous.  After further discussion with the patient, the patient has  been on hypertension medication in the past by the [facility] in  [place].  The patient indicates that she has recently had a blood  pressure check; it was normal; she ran out of medication so she did not  continue her medications.  Today, currently, it is 172/70.  After further  discussion, the patient has also been evaluated and she was found to have a  left carotid artery blockage, approximately 50%.   PAST SURGICAL HISTORY:  1. Cholecystectomy.  2. Appendectomy.  3. Partial hysterectomy.  4. Right total knee arthroplasty.   The patient denies any complications with the above-mentioned surgical  procedures.   SOCIAL HISTORY:  The patient is a fairly healthy-appearing well-developed  white female, 77 years of age.  Denies any history of smoking or alcohol  use.  She is widowed.  She lives alone in a 2-story home, 1 step to the main  entrance.  She is retired.   FAMILY PHYSICIAN:  Dr. Ramon Dredge L. Hawkins in [place], [phone].   FAMILY MEDICAL HISTORY:  Mother is deceased with a history of CVA and  Alzheimer's in her 57s.  Father is deceased with a history of cardiac  disease and a CVA.   REVIEW OF SYSTEMS:  Review of systems is  positive for a cough and cold; the  patient has been battling this since January; she has been on 3 antibiotics;  currently, her symptoms have improved; she just has a little bit of a cough,  but she is also to see an ENT for further evaluation this Thursday.  The  patient does have some issues with occasional neck and back discomfort from  her arthritis.  She does wear glasses.  She does have a hearing aid.  She  does have shortness of breath with exertion but she denies any cardiac-  related symptoms such as diaphoresis or chest discomfort or lightheadedness  or dizziness.  She does have occasional problems with constipation.   PHYSICAL EXAM:  VITALS:  Height is 5 foot 4 inches.  Weight is 172 pounds.  Blood pressure is 172/70, recheck unchanged; pulse 72 and regular;  respirations 12; the patient is afebrile.  GENERAL:  This is a healthy-appearing, fairly well-developed white female  who appears to be in no acute distress at this time.  She is able to  ambulate with a slight limp.  She is able to get herself on and off the exam  table without any difficulty.  HEENT:  Head is normocephalic.  Pupils are equal, round and reactive,  accommodating to light.  Extraocular movements  intact.  External ears were  without deformities.  The patient's gross hearing is intact; she does have  her hearing aids in place.  Nasal septum was midline.  Oral buccal mucosa  was pink and moist without lesions.  Dentition was in fair repair.  NECK:  Neck was supple.  No palpable lymphadenopathy.  Thyroid region was  nontender.  The patient had good range of motion of her cervical spine  without any difficulty or tenderness.  HEART:  Regular rate and rhythm.  S1 and S2 were auscultated, no murmurs,  rubs, or gallops.  ABDOMEN:  Abdomen was soft and nontender.  Bowel sounds were normoactive  throughout.  She had no hepatosplenomegaly.  CVA region was nontender.  EXTREMITIES:  Upper extremities were symmetrically sized and shaped.  She  had good range of motion of her shoulders, elbows and wrists.  Motor  strength was 5/5.  Lower extremities:  Right and left hips had full  extension, flexion up to 110 degrees.  Right knee had a well-healed midline  surgical incision, no erythema or ecchymosis, no effusion.  She had the  typical total knee replacement clunk but she had full extension, flexion  back to 110 degrees with no instability.  The left knee was round, boggy-  appearing.  She was tender along the medial and lateral joint lines, no  effusion, no erythema or ecchymosis.  She had coarse crepitus under the  patella with range of motion.  She had full extension and was able to flex  it back to about 90-95 degrees.  She had about 5-10 degrees valgus-varus  laxity with valgus-varus stress.  Calves were nontender.  Ankles were  symmetrical with good dorsi and plantarflexion.  PERIPHERAL VASCULATURE:  Carotid pulses were 2+, no bruits present.  Dorsalis pedis and posterior tibials were 2+.  She had 1+ dorsalis pedis  pulses bilaterally.  She had a moderate amount of edema which was non-  pitting, right ankle; no significant edema, left ankle.  She had diffuse varicosities about the lower  extremities.  NEUROLOGIC:  The patient was conscious, alert and appropriate.  She did have  some difficulty with some medical issues in which  she did not remember  initially until after discussion further into the exam.  Otherwise, there  were no gross neurologic defects noted.  BREASTS, RECTAL AND GU:  Exams were deferred at this time.   IMPRESSION:  1. End-stage osteoarthritis of left knee.  2. Uncontrolled hypertension.  3. Hiatal hernia.  4. Esophageal strictures.   PLAN:  After finding out the patient was on the hypertensive side this date  and further discussion, I contacted Dr. Juanetta Gosling' office and the patient has  been on multiple blood pressure medicines in the past but on last visit, she  was normotensive, being off of blood pressure medications, so they are not  quite sure at this point whether or not she needs to continue any  medications but they will see her in the office within the next day or two  for evaluation of her blood pressure and make a decision about further  treatment at that time prior to her surgery.  The patient will also be  evaluated by ENT prior to the surgery for evaluation of her sinusitis versus  chronic upper respiratory infection which is now asymptomatic.  Otherwise,  the patient will undergo all routine labs and tests prior to having a left  total knee arthroplasty by Dr. Claude Manges. [place] at [facility] on  Apr 13, 2004.      Jamelle Rushing, P.A.                      Claude Manges. Cleophas Dunker, M.D.    RWK/MEDQ  D:  04/12/2004  T:  04/12/2004  Job:  [id number]   cc:   Claude Manges. Cleophas Dunker, M.D.  [address] [place]  [place] [address]  Fax: [phone]

## 2011-04-27 NOTE — Procedures (Signed)
Wendy Perkins, Wendy Perkins               ACCOUNT NO.:  [id number]   MEDICAL RECORD NO.:  [id number]          PATIENT TYPE:  OUT   LOCATION:  RAD                           FACILITY:  [facility]   PHYSICIAN:  Vida Roller, M.D.   DATE OF BIRTH:  Jan 31, 1919   DATE OF PROCEDURE:  05/10/2006  DATE OF DISCHARGE:                                    STRESS TEST   PRIMARY CARDIOLOGIST:  Dr. Vida Roller   INDICATIONS:  Wendy Perkins is an 77 year old female with no known history of  coronary artery disease with a negative adenosine stress test in 2003 who  recently presented with new onset congestive heart failure and hypertension.  She denies any history of exertional chest discomfort.   TEST:  Patient was infused with 48 mg of adenosine and test was performed as  per protocol.   Heart rate rose from 66 baseline to 85 maximum; blood pressure 142/68  baseline to 118/68 final.   Patient did not develop any chest discomfort during the procedure.   Serial EKG tracings revealed no significant ST abnormalities; no AV block or  dysrhythmias noted.   CONCLUSION:  Negative adenosine stress test; perfusion images pending.      Gene Serpe, P.A. [facility]      Vida Roller, M.D.  Electronically Signed    GS/MEDQ  D:  05/10/2006  T:  05/10/2006  Job:  [id number]

## 2011-05-09 ENCOUNTER — Other Ambulatory Visit: Payer: Self-pay | Admitting: Obstetrics & Gynecology

## 2011-05-09 DIAGNOSIS — Z139 Encounter for screening, unspecified: Secondary | ICD-10-CM

## 2011-05-15 ENCOUNTER — Ambulatory Visit (HOSPITAL_COMMUNITY): Payer: Medicare HMO

## 2011-09-11 LAB — DIFFERENTIAL
Basophils Absolute: 0 10*3/uL (ref 0.0–0.1)
Lymphs Abs: 1.3 10*3/uL (ref 0.7–4.0)
Monocytes Absolute: 0.7 10*3/uL (ref 0.1–1.0)
Neutro Abs: 12.2 10*3/uL — ABNORMAL HIGH (ref 1.7–7.7)

## 2011-09-11 LAB — BASIC METABOLIC PANEL
CO2: 23 mEq/L (ref 19–32)
Calcium: 8.8 mg/dL (ref 8.4–10.5)
GFR calc Af Amer: >60 mL/min (ref >60)
GFR calc non Af Amer: 51 mL/min — ABNORMAL LOW (ref >60)

## 2011-09-11 LAB — CBC
Hemoglobin: 13.1 g/dL (ref 12.0–15.0)
MCHC: 33.6 g/dL (ref 30.0–36.0)
RBC: 4.65 MIL/uL (ref 3.87–5.11)
RDW: 14.1 % (ref 11.5–15.5)
WBC: 14.3 10*3/uL — ABNORMAL HIGH (ref 4.0–10.5)

## 2011-09-20 LAB — BLOOD GAS, ARTERIAL
Acid-base deficit: 3.4 — ABNORMAL HIGH
FIO2: 0.21
O2 Saturation: 95.7
TCO2: 17.5

## 2011-11-19 ENCOUNTER — Ambulatory Visit (HOSPITAL_COMMUNITY)
Admission: RE | Admit: 2011-11-19 | Discharge: 2011-11-19 | Disposition: A | Discharge status: Home / Self Care | Payer: Medicare HMO | Source: Ambulatory Visit | Attending: Pulmonary Disease | Admitting: Pulmonary Disease

## 2011-11-19 ENCOUNTER — Other Ambulatory Visit (HOSPITAL_COMMUNITY): Payer: Self-pay | Admitting: Pulmonary Disease

## 2011-11-19 DIAGNOSIS — J4489 Other specified chronic obstructive pulmonary disease: Secondary | ICD-10-CM | POA: Insufficient documentation

## 2011-11-19 DIAGNOSIS — R0602 Shortness of breath: Secondary | ICD-10-CM | POA: Insufficient documentation

## 2011-11-19 DIAGNOSIS — I509 Heart failure, unspecified: Secondary | ICD-10-CM | POA: Insufficient documentation

## 2011-11-19 DIAGNOSIS — J449 Chronic obstructive pulmonary disease, unspecified: Secondary | ICD-10-CM | POA: Insufficient documentation

## 2011-11-23 ENCOUNTER — Encounter: Payer: Self-pay | Admitting: Cardiology

## 2011-11-23 ENCOUNTER — Ambulatory Visit: Payer: Medicare HMO | Admitting: Adult Health

## 2011-11-23 ENCOUNTER — Ambulatory Visit (INDEPENDENT_AMBULATORY_CARE_PROVIDER_SITE_OTHER): Payer: Medicare HMO | Admitting: Cardiology

## 2011-11-23 DIAGNOSIS — I5032 Chronic diastolic (congestive) heart failure: Secondary | ICD-10-CM | POA: Insufficient documentation

## 2011-11-23 DIAGNOSIS — I5033 Acute on chronic diastolic (congestive) heart failure: Secondary | ICD-10-CM

## 2011-11-23 DIAGNOSIS — I4891 Unspecified atrial fibrillation: Secondary | ICD-10-CM

## 2011-11-23 DIAGNOSIS — I10 Essential (primary) hypertension: Secondary | ICD-10-CM

## 2011-11-23 MED ORDER — DILTIAZEM HCL ER COATED BEADS 120 MG PO CP24: 120.0000 mg | ORAL_CAPSULE | Freq: Every day | ORAL | Status: DC

## 2011-11-23 MED ORDER — FUROSEMIDE 20 MG PO TABS: 20.0000 mg | ORAL_TABLET | Freq: Two times a day (BID) | ORAL | Status: DC

## 2011-11-23 NOTE — Patient Instructions (Signed)
Your physician recommends that you schedule a follow-up appointment in: 3 months  Your physician has recommended you make the following change in your medication:  1 - STOP Losartin 2-START Cardizem CD 120 mg daily 3 - Decrease Lasix to 20 mg daily   Your physician recommends that you return for lab work in: 2 weeks  Your physician has requested that you have an echocardiogram. Echocardiography is a painless test that uses sound waves to create images of your heart. It provides your doctor with information about the size and shape of your heart and how well your heart's chambers and valves are working. This procedure takes approximately one hour. There are no restrictions for this procedure.

## 2011-11-23 NOTE — Progress Notes (Signed)
Clinical Summary Wendy Perkins is a 77 y.o.female presenting for followup. She was seen in February of this year. Within the last few weeks she has felt more short of breath with exertion. Denies any palpitations or chest pain. She has been evaluated by Dr. Juanetta Gosling.  Recent lab work shows BNP 211, Hgb 13.2, platelets 249. Recent chest x-ray did suggest mild CHF findings with edema. ECG today shows atrial fibrillation.  I reviewed atrial fibrillation with the patient and her son. She does not experience any palpitations. We did touch on the possibility of anticoagulant therapies, however she has had history of falls, and I would be concerned about her bleeding risk. She has been able to tolerate aspirin. We discussed medical therapy and relatively conservative approach at this time. Also followup echocardiogram was discussed to reassess LVEF.   No Known Allergies  Medication list reviewed.  Past Medical History  Diagnosis Date  . Dyspnea     Previous CPX suggesting possible restrictive physiology, respiratory muscle fatigue, diastolic dysfunction  . Seasonal allergies   . Hyperlipidemia   . Essential hypertension, benign   . Carotid artery disease   . Dizziness     Social History Wendy Perkins reports that she has never smoked. She has never used smokeless tobacco. Wendy Perkins reports that she does not drink alcohol.  Review of Systems Lower tibia edema has progressed. No orthopnea or PND. Stable appetite. Otherwise negative.  Physical Examination Filed Vitals:   11/23/11 1525  BP: 161/80  Pulse: 83  Resp: 16    Overweight elderly woman in no acute distress.  HEENT: Conjunctiva and lids normal, oropharynx with moist mucosa.  Neck: Supple, no elevated JVP or bruits.  Lungs: Clear to auscultation, nonlabored.  Cardiac: Irregularly irregular, no S3.  Extremities: 1-2+ ankle edema, right greater than left. Skin: Warm and dry. Musculoskeletal: Kyphosis noted. Neuropsychiatric: Alert  and oriented x3, affect appropriate.   ECG Atrial fibrillation, nonspecific T wave changes.  Problem List and Plan

## 2011-11-23 NOTE — Assessment & Plan Note (Signed)
Possible diagnosis, some edema noted by chest x-ray as well as lower extremity edema. May well be associated with recurrence of atrial fibrillation. She will take Lasix 20 mg daily, followup BMET in 2 weeks. Followup echocardiogram also be obtained to reassess cardiac structure and function.

## 2011-11-23 NOTE — Assessment & Plan Note (Signed)
For now substituting losartan with Cardizem CD in light of atrial fibrillation.

## 2011-11-23 NOTE — Assessment & Plan Note (Signed)
Newly diagnosed. May have occurred within the last few weeks associated with the patient's perceived increase in shortness of breath. She does not experience palpitations. I reviewed the diagnosis and its implications including increased stroke risk. Anticoagulant therapy could be considered, although bleeding risk would be a major concern in this 77 year old woman. I would appreciate Dr. Juanetta Gosling input on her candidacy for anticoagulant therapy. In the meanwhile she will take aspirin at 162 mg daily, and we will initiate Cardizem CD 120 mg daily in an effort to control heart rate with exertion. Of note, she recently stopped losartan. Followup arranged within the next few weeks.

## 2011-11-28 ENCOUNTER — Ambulatory Visit (HOSPITAL_COMMUNITY)
Admission: RE | Admit: 2011-11-28 | Discharge: 2011-11-28 | Disposition: A | Discharge status: Home / Self Care | Payer: Medicare HMO | Source: Ambulatory Visit | Attending: Cardiology | Admitting: Cardiology

## 2011-11-28 DIAGNOSIS — I10 Essential (primary) hypertension: Secondary | ICD-10-CM | POA: Insufficient documentation

## 2011-11-28 DIAGNOSIS — I059 Rheumatic mitral valve disease, unspecified: Secondary | ICD-10-CM

## 2011-11-28 DIAGNOSIS — I5033 Acute on chronic diastolic (congestive) heart failure: Secondary | ICD-10-CM

## 2011-11-28 DIAGNOSIS — I4891 Unspecified atrial fibrillation: Secondary | ICD-10-CM | POA: Insufficient documentation

## 2011-11-28 NOTE — Progress Notes (Signed)
*  PRELIMINARY RESULTS* Echocardiogram 2D Echocardiogram has been performed.  Wendy Perkins 11/28/2011, 8:32 AM

## 2011-12-05 ENCOUNTER — Telehealth: Payer: Self-pay | Admitting: Cardiology

## 2011-12-05 NOTE — Telephone Encounter (Signed)
Patient states small amt of blood with BM this am due to constipation and has not had any more bleeding since.  Not taking a stool softener at this time and advised her that she may want to add this to her regimen.  If continues, contact PCP.

## 2011-12-05 NOTE — Telephone Encounter (Signed)
PT WAS PUT ON NEW MEDICATION, SHE IS NOT SURE OF NAME, BUT WOULD LIKE TO KNOW IF IT COULD CAUSE HER TO BE CONSTIPATED. SHE STATES SHE HAD A BM THIS AM AND WAS PASSING BLOOD.

## 2011-12-07 ENCOUNTER — Other Ambulatory Visit: Payer: Self-pay | Admitting: Cardiology

## 2011-12-08 LAB — BASIC METABOLIC PANEL
BUN: 13 mg/dL (ref 6–23)
CO2: 25 mEq/L (ref 19–32)
Calcium: 8.8 mg/dL (ref 8.4–10.5)
Glucose, Bld: 98 mg/dL (ref 70–99)
Potassium: 4.5 mEq/L (ref 3.5–5.3)
Sodium: 139 mEq/L (ref 135–145)

## 2011-12-14 ENCOUNTER — Encounter: Payer: Self-pay | Admitting: Adult Health

## 2011-12-14 ENCOUNTER — Ambulatory Visit (INDEPENDENT_AMBULATORY_CARE_PROVIDER_SITE_OTHER): Payer: Medicare Other | Admitting: Adult Health

## 2011-12-14 DIAGNOSIS — I4891 Unspecified atrial fibrillation: Secondary | ICD-10-CM

## 2011-12-14 DIAGNOSIS — I5033 Acute on chronic diastolic (congestive) heart failure: Secondary | ICD-10-CM

## 2011-12-14 DIAGNOSIS — I10 Essential (primary) hypertension: Secondary | ICD-10-CM

## 2011-12-14 MED ORDER — POTASSIUM CHLORIDE CRYS ER 20 MEQ PO TBCR: 20.0000 meq | EXTENDED_RELEASE_TABLET | Freq: Every day | ORAL | Status: DC

## 2011-12-14 MED ORDER — FUROSEMIDE 20 MG PO TABS: 20.0000 mg | ORAL_TABLET | Freq: Every day | ORAL | Status: DC

## 2011-12-14 NOTE — Assessment & Plan Note (Signed)
Heart rate is currently well controlled on cardizem 120 mg daily. She asked about DCCV. I have explained to her that this would require at least 6 weeks of anticoagulant therapy and then a planned TEE to evaluate before DCCV. Per last note of Dr. Diona Browner, medical therapy was recommended. She will discuss this further with Dr. Diona Browner. I explained that HR control and reduced risk of CVA is most important at this time. I have increased her ASA to EC 325 mg daily.

## 2011-12-14 NOTE — Progress Notes (Signed)
Clinical Summary Ms. Dusenbury is a 78 y.o.female patient of Dr. Diona Browner we are following for ongoing assessment and treatment of atrial fibrillation, with history of hypertension, hyperlipidemia..On last visit with Dr. Diona Browner he reviewed atrial fibrillation with the patient and her son. She does not experience any palpitations. Discussion about the possibility of anticoagulation therapy was completed. He was concerned about her bleeding risk. She has been able to tolerate aspirin. Medical therapy and relatively conservative approach was planned. Echo was ordered and she was started on cardizem 120mg  daily, and losartan was discontinued. She was advised to take lasix 20 mg daily, but has only been taking it every other day. She states that she cannot tell any difference in her breathing status or energy level with changes in the medications. She also states that she continues to have to stop when cleaning her house, to rest. She is also concerned about worsening LEE. She denies chest pain.  Allergies  Allergen Reactions  . Sulfur     Past Medical History  Diagnosis Date  . Dyspnea     Previous CPX suggesting possible restrictive physiology, respiratory muscle fatigue, diastolic dysfunction  . Seasonal allergies   . Hyperlipidemia   . Essential hypertension, benign   . Carotid artery disease   . Dizziness     Social History Ms. Bena reports that she has never smoked. She has never used smokeless tobacco. Ms. Morrissette reports that she does not drink alcohol.  Review of Systems Lower tibia edema has progressed. No orthopnea or PND. Stable appetite. Otherwise negative.  Physical Examination Filed Vitals:   12/14/11 1431  BP: 149/81  Pulse: 71  Resp: 16    General: Well developed, well nourished, in no acute distress Head: Eyes PERRLA, No xanthomas.   Normal cephalic and atramatic  Lungs: Clear bilaterally to auscultation and percussion. Heart: Irregular, without MRG.  Pulses are 2+ &  equal.            No carotid bruit. No JVD.  No abdominal bruits. No femoral bruits. Abdomen: Bowel sounds are positive, abdomen soft and non-tender without masses or                  Hernia's noted. Msk:  Back normal, normal gait. Normal strength and tone for age. Extremities: No clubbing, cyanosis or edema 2+ edema on the right, 1 +on the left.   Neuro: Alert and oriented X 3. Psych:  Good affect, responds appropriately  Assessment and Plan:

## 2011-12-14 NOTE — Assessment & Plan Note (Addendum)
Weight is unchanged from prior office visit.  She continues to have complaints of DOE.  She denies chest pain or shortness of breath. Review of echo completed on 11/28/11 :  Left ventricle: The cavity size was normal. There was mild concentric hypertrophy. Systolic function was normal. The estimated ejection fraction was in the range of 60% to 65%. Wall motion was normal; there were no regional wall motion abnormalities. - Aortic valve: Mildly calcified annulus. - Mitral valve: Calcified annulus. Mild to moderate regurgitation. - Left atrium: The atrium was moderately dilated. - Right ventricle: The cavity size was normal. Borderline RVH was present. - Right atrium: The atrium was moderately dilated. - Atrial septum: No defect or patent foramen ovale was identified. - Pulmonary arteries: PA peak pressure: 33mm Hg (S).   I will have her take lasix 20 mg daily as directed by Dr. Diona Browner on last visit. Her son, who is with her on this visit, states that she is not compliant with salt restrictions. I have given her written instructions on the low salt DASH diet. I have asked her to buy a digital scale, weigh herself daily, and record the readings. She is to call us for wt gain of 2-3 lbs in 1-2 days, or 5 lbs in one week. She is to bring these recordings to follow-up appointment with Dr. Diona Browner. She will have BMET drawn in one week. I have spent 30 minutes with this patient and her son on this visit.

## 2011-12-14 NOTE — Patient Instructions (Signed)
**Note De-Identified  Obfuscation** Your physician has recommended you make the following change in your medication: change Aspirin to (coated) EC Aspirin 325 mg daily, increase Furosemide to 20 mg  every day and Potassium to 1 tablet everyday  Your physician recommends that you return for lab work in: 1 week  Your physician recommends that you weigh, daily, at the same time every day, and in the same amount of clothing. Please record your daily weights on the handout provided and bring it to your next appointment.  Your physician recommends that you start Dash diet, please see handout given to you today.  Your physician recommends that you schedule a follow-up appointment in: 2 weeks

## 2011-12-14 NOTE — Assessment & Plan Note (Signed)
Blood pressure is well controlled at present. No changes at this time.

## 2011-12-22 LAB — BASIC METABOLIC PANEL
CO2: 25 mEq/L (ref 19–32)
Calcium: 9.2 mg/dL (ref 8.4–10.5)
Creat: 0.92 mg/dL (ref 0.50–1.10)
Glucose, Bld: 101 mg/dL — ABNORMAL HIGH (ref 70–99)
Sodium: 141 mEq/L (ref 135–145)

## 2011-12-27 ENCOUNTER — Encounter: Payer: Self-pay | Admitting: Cardiology

## 2011-12-27 ENCOUNTER — Ambulatory Visit (INDEPENDENT_AMBULATORY_CARE_PROVIDER_SITE_OTHER): Payer: Medicare Other | Admitting: Cardiology

## 2011-12-27 VITALS — BP 169/82 | HR 76 | Ht 62.5 in | Wt 188.0 lb

## 2011-12-27 DIAGNOSIS — I4891 Unspecified atrial fibrillation: Secondary | ICD-10-CM

## 2011-12-27 DIAGNOSIS — I5032 Chronic diastolic (congestive) heart failure: Secondary | ICD-10-CM

## 2011-12-27 DIAGNOSIS — I509 Heart failure, unspecified: Secondary | ICD-10-CM

## 2011-12-27 DIAGNOSIS — I10 Essential (primary) hypertension: Secondary | ICD-10-CM

## 2011-12-27 NOTE — Assessment & Plan Note (Signed)
Recent echocardiogram reviewed. She reports beginning to feel better. Continue current duiretic dose. If blood stays up, will need to consider adding back prior ARB. Check BMET for next visit. Follow weights.

## 2011-12-27 NOTE — Patient Instructions (Signed)
Your physician recommends that you schedule a follow-up appointment in: 1 month    Your physician recommends that you return for lab work in:  3 weeks

## 2011-12-27 NOTE — Assessment & Plan Note (Signed)
Plan to continue ASA, Cardizem CD. Heart control looks good at this point. No plan for DCCV after our discussion and preference to avoid anticoagulants.

## 2011-12-27 NOTE — Progress Notes (Signed)
   Clinical Summary Wendy Perkins is a 78 y.o.female presenting for followup. She was seen in December with new diagnosis of atrial fibrillation. She also had a visit with Wendy Perkins just recently.  Her weight is down 2 pounds from the last visit. I reviewed her home weights. Labwork from 1/4 showed potassiu, 4.8, BUN 15, creatinine 0.9.  She states that she has been taking her Lasix daily and is feeling somewhat better. Still has leg edema, some days better that others.  Echocardiogram from 12/19 showed LVEF 60-65%, moderate LAE, mild to moderate mitral regurgitation, RVSP 33 mmHg. We discussed this today.   Allergies  Allergen Reactions  . Sulfur     Current Outpatient Prescriptions  Medication Sig Dispense Refill  . Albuterol (VENTOLIN IN) Inhale into the lungs.        Marland Kitchen aspirin 325 MG EC tablet Take 325 mg by mouth daily.        . cycloSPORINE (RESTASIS) 0.05 % ophthalmic emulsion 1 drop 2 (two) times daily.        Marland Kitchen diltiazem (CARDIZEM CD) 120 MG 24 hr capsule Take 1 capsule (120 mg total) by mouth daily.  90 capsule  3  . furosemide (LASIX) 20 MG tablet Take 1 tablet (20 mg total) by mouth daily.  30 tablet  3  . Multiple Vitamins-Minerals (MULTI COMPLETE PO) Take by mouth daily.        . naproxen (NAPROSYN) 500 MG tablet Take 500 mg by mouth 2 (two) times daily with a meal.        . omeprazole (PRILOSEC) 20 MG capsule Take 20 mg by mouth daily.        Bertram Gala Glycol-Propyl Glycol (SYSTANE OP) Apply to eye as directed.        . potassium chloride SA (K-DUR,KLOR-CON) 20 MEQ tablet Take 1 tablet (20 mEq total) by mouth daily.  30 tablet  3  . tiotropium (SPIRIVA) 18 MCG inhalation capsule Place 18 mcg into inhaler and inhale daily.          Past Medical History  Diagnosis Date  . Dyspnea     Previous CPX suggesting possible restrictive physiology, respiratory muscle fatigue, diastolic dysfunction  . Seasonal allergies   . Hyperlipidemia   . Essential hypertension, benign     . Carotid artery disease   . Dizziness   . Atrial fibrillation     Social History Wendy Perkins reports that she has never smoked. She has never used smokeless tobacco. Wendy Perkins reports that she does not drink alcohol.  Review of Systems Negative except as outlined.  Physical Examination Filed Vitals:   12/27/11 0929  BP: 169/82  Pulse: 76   Overweight elderly woman in no acute distress.  HEENT: Conjunctiva and lids normal, oropharynx with moist mucosa.  Neck: Supple, no elevated JVP or bruits.  Lungs: Clear to auscultation, nonlabored.  Cardiac: Irregularly irregular, no S3.  Extremities: 1-2+ ankle edema, right greater than left.  Skin: Warm and dry.  Musculoskeletal: Kyphosis noted.  Neuropsychiatric: Alert and oriented x3, affect appropriate.      Problem List and Plan

## 2011-12-27 NOTE — Assessment & Plan Note (Signed)
Blood pressure elevated. As noted, may need to add back ARB.

## 2012-01-16 ENCOUNTER — Other Ambulatory Visit: Payer: Self-pay | Admitting: Cardiology

## 2012-01-17 LAB — BASIC METABOLIC PANEL
CO2: 23 mEq/L (ref 19–32)
Calcium: 9.2 mg/dL (ref 8.4–10.5)
Creat: 1.23 mg/dL — ABNORMAL HIGH (ref 0.50–1.10)
Sodium: 137 mEq/L (ref 135–145)

## 2012-01-30 ENCOUNTER — Ambulatory Visit (INDEPENDENT_AMBULATORY_CARE_PROVIDER_SITE_OTHER): Payer: Medicare Other | Admitting: Cardiology

## 2012-01-30 ENCOUNTER — Encounter: Payer: Self-pay | Admitting: Cardiology

## 2012-01-30 VITALS — BP 143/77 | HR 45 | Resp 18 | Ht 62.0 in | Wt 187.0 lb

## 2012-01-30 DIAGNOSIS — I4891 Unspecified atrial fibrillation: Secondary | ICD-10-CM

## 2012-01-30 DIAGNOSIS — I10 Essential (primary) hypertension: Secondary | ICD-10-CM

## 2012-01-30 NOTE — Progress Notes (Signed)
   Clinical Summary [name] is a 78 y.o.female presenting for followup. She was seen in January. She is here with her son. She states that she has been doing reasonably well. Does have dizziness occasionally, no falls or syncope. No palpitations. We reviewed her home weights.  Labwork from 2/6 showed potassium 4.4, BUN 12, creatinine 1.2. Her weight is down a pound from last visit.   Allergies  Allergen Reactions  . Sulfur     Current Outpatient Prescriptions  Medication Sig Dispense Refill  . Albuterol (VENTOLIN IN) Inhale into the lungs.        Marland Kitchen aspirin 325 MG EC tablet Take 325 mg by mouth daily.        . cycloSPORINE (RESTASIS) 0.05 % ophthalmic emulsion 1 drop 2 (two) times daily.        Marland Kitchen diltiazem (CARDIZEM CD) 120 MG 24 hr capsule Take 1 capsule (120 mg total) by mouth daily.  90 capsule  3  . furosemide (LASIX) 20 MG tablet Take 1 tablet (20 mg total) by mouth daily.  30 tablet  3  . Multiple Vitamins-Minerals (MULTI COMPLETE PO) Take by mouth daily.        . naproxen (NAPROSYN) 500 MG tablet Take 500 mg by mouth 2 (two) times daily with a meal.        . omeprazole (PRILOSEC) 20 MG capsule Take 20 mg by mouth daily.        Bertram Gala Glycol-Propyl Glycol (SYSTANE OP) Apply to eye as directed.        . potassium chloride SA (K-DUR,KLOR-CON) 20 MEQ tablet Take 1 tablet (20 mEq total) by mouth daily.  30 tablet  3  . tiotropium (SPIRIVA) 18 MCG inhalation capsule Place 18 mcg into inhaler and inhale daily.          Past Medical History  Diagnosis Date  . Dyspnea     Previous CPX suggesting possible restrictive physiology, respiratory muscle fatigue, diastolic dysfunction  . Seasonal allergies   . Hyperlipidemia   . Essential hypertension, benign   . Carotid artery disease   . Dizziness   . Atrial fibrillation     Social History [name] reports that she has never smoked. She has never used smokeless tobacco. [name] reports that she does not drink  alcohol.  Review of Systems Otherwise negative except as outlined.  Physical Examination Filed Vitals:   01/30/12 1408  BP: 143/77  Pulse: 45  Resp: 18   Overweight elderly woman in no acute distress.  HEENT: Conjunctiva and lids normal, oropharynx with moist mucosa.  Neck: Supple, no elevated JVP or bruits.  Lungs: Clear to auscultation, nonlabored.  Cardiac: Irregularly irregular, no S3.  Extremities: 1-2+ ankle edema, right greater than left.     Problem List and Plan

## 2012-01-30 NOTE — Assessment & Plan Note (Signed)
Stable - no changes to current regimen. Follow weights at home. Followup arranged.

## 2012-01-30 NOTE — Assessment & Plan Note (Signed)
No new medications added at this point.

## 2012-01-30 NOTE — Patient Instructions (Signed)
Your physician recommends that you schedule a follow-up appointment in: 2 months with Dr McDowell  Your physician recommends that you continue on your current medications as directed. Please refer to the Current Medication list given to you today.   

## 2012-01-31 ENCOUNTER — Ambulatory Visit (INDEPENDENT_AMBULATORY_CARE_PROVIDER_SITE_OTHER): Payer: Medicare Other | Admitting: Otolaryngology

## 2012-01-31 DIAGNOSIS — H903 Sensorineural hearing loss, bilateral: Secondary | ICD-10-CM

## 2012-01-31 DIAGNOSIS — R42 Dizziness and giddiness: Secondary | ICD-10-CM

## 2012-02-19 ENCOUNTER — Ambulatory Visit (INDEPENDENT_AMBULATORY_CARE_PROVIDER_SITE_OTHER): Payer: Medicare Other | Admitting: Internal Medicine

## 2012-02-19 ENCOUNTER — Encounter (INDEPENDENT_AMBULATORY_CARE_PROVIDER_SITE_OTHER): Payer: Self-pay | Admitting: Internal Medicine

## 2012-02-19 ENCOUNTER — Other Ambulatory Visit (INDEPENDENT_AMBULATORY_CARE_PROVIDER_SITE_OTHER): Payer: Self-pay | Admitting: *Deleted

## 2012-02-19 VITALS — BP 130/80 | HR 84 | Temp 98.1°F | Resp 14 | Ht 62.0 in | Wt 184.3 lb

## 2012-02-19 DIAGNOSIS — K922 Gastrointestinal hemorrhage, unspecified: Secondary | ICD-10-CM

## 2012-02-19 DIAGNOSIS — K625 Hemorrhage of anus and rectum: Secondary | ICD-10-CM

## 2012-02-19 NOTE — Patient Instructions (Signed)
Clear liquids all day on 02/21/2012. Take MiraLax polyethylene glycol 17 g 2 doses on 02/21/2012. Continue to hold aspirin and naproxen. Colonoscopy to be scheduled on 02/22/2012 and prep for the procedure to be given in the hospital

## 2012-02-19 NOTE — Consult Note (Signed)
Dictated; Job [id number]

## 2012-02-20 ENCOUNTER — Encounter (HOSPITAL_COMMUNITY): Payer: Self-pay | Admitting: Pharmacy Technician

## 2012-02-20 NOTE — Progress Notes (Signed)
CONSULTING PHYSICIAN: Wendy Perkins. Wendy Sills, MD  PRIMARY CARE PHYSICIAN: Wendy Perkins. Wendy Perkins, M.D.  REASON FOR CONSULTATION: Rectal bleeding.  HISTORY OF PRESENT ILLNESS: The patient is a 77 year old Caucasian  female who was in usual state of health until February 14, 2012, when she  felt an urge to have a BM and instead passed large amount of bright red  blood per rectum. She does not recall that she had abdominal pain,  nausea, vomiting, or postal symptoms. The next morning, she passed more  bright red blood per rectum, but not as much she did the day before.  She did not experience any associated symptoms. She was seen by Dr.  [name], who was covering for Dr. [name]. She was hemodynamically  stable. Her hemoglobin was 14 g. This visit was arranged. Dr. [name]  told her to go to emergency room if she has recurrent bleed. The  patient states that 2 days ago, she noted dark burgundy stools, but  yesterday and today has been dark brown. She denies abdominal pain,  nausea, vomiting, heartburn, anorexia, or weight loss. She complains of  occasional dysphagia to solids, but she believes she eats too fast. She  has had problems with imbalance and dizziness/vertigo, she states this  symptom has not gotten any worse since bleeding episodes. She states  her constipation is well controlled with MiraLax. The patient also  complains of dyspnea on minimal exertion, but this has not changed  either. The patient's last colonoscopy was over 20 years ago.  The patient has been on full dose aspirin and naproxen and Dr. [name]  advised her to stop both of these medications.  CURRENT MEDICATIONS:  1. Restasis 0.05% ophthalmic emulsion 1 drop to each eye twice daily.  2. Diltiazem 120 mg p.o. q.24 h.  3. Furosemide 20 mg p.o. daily.  4. Multivitamin with minerals 1 daily.  5. Omeprazole 20 mg p.o. q.a.m.  6. Polyethylene glycol/propylene glycol eye drops p.r.n.  7. Polyethylene glycol powder 17 g p.o. daily.  8. KCl 20  mEq daily.  9. Spiriva 18 mcg 1 puff daily.  10.Ventolin inhaler 2 puffs q.i.d. p.r.n.  PAST MEDICAL HISTORY: She has hypertension and history of diastolic  dysfunction and CHF.  She was recently diagnosed with atrial fibrillation and being evaluated  by Dr. Nona Perkins. She has COPD with dyspnea on minimal exertion  and she is on nasal O2 at bedtime.  She has hyperlipidemia.  She has had bilateral knee replacement several years ago.  She had open cholecystectomy with incidental appendectomy over 30 years  ago. She had surgery on her left wrist for fracture and she had  immobilization for right wrist fracture.  She has had bilateral cataract surgery.  Osteoarthrosis and low back pain.  ALLERGIES: SULFA.  FAMILY HISTORY: Noncontributory.  SOCIAL HISTORY: She is widow. She lost her husband in 1995. She lives  alone, but one of her sons lives right next door. She has 4 children.  One daughter has emphysema and rheumatoid arthritis. Other daughter and  2 sons are in good health. She has been retired for years, all-in-all  she worked for 50 years. She has never smoked cigarettes or drank  alcohol.  PHYSICAL EXAMINATION: VITAL SIGNS: Weight 184.3 pounds, she is 62  inches tall, pulse 84 per minute and regular, blood pressure 130/80,  respirations 16 and temperature is 98.1.  HEENT: Conjunctivae are pink. Sclerae are nonicteric. Oropharyngeal  mucosa is normal. Few teeth are missing, remaining dentition is in  satisfactory condition.  NECK: No neck masses or thyromegaly noted.  CARDIAC: Regular rhythm. Normal S1, S2. No murmur or gallop noted.  LUNGS: Auscultation of lungs revealed vesicular breath sounds  bilaterally with diminished intensity.  ABDOMEN: Full. Bowel sounds are normal on palpation. Soft abdomen  with mild tenderness at LLQ. No organomegaly or masses.  RECTAL: Dark brown stool which is heme positive.  EXTREMITIES: No clubbing or koilonychia noted. She has 1+ edema    involving her legs around her ankles.  LABORATORY DATA: Her hemoglobin was 14 g four days ago.  ASSESSMENT: Wendy Perkins is a very pleasant 79 year old Caucasian female  who experienced an episode of rectal bleeding last week. She did not  experience hemodynamics. She did not experience any postal symptoms.  From her history, it appears she passed large amount of blood. She has  stopped spontaneously. The patient has been on NSAID for the last year  or 2, and she is also on aspirin. She does not have any symptoms to  suggest peptic ulcer disease. She most likely has bled from her colon  and perhaps from diverticulosis, but also need to rule out NSAID  colopathy or arteriovenous malformations or neoplasm. If her  colonoscopy is entirely normal, only then we would consider evaluation  of her upper gastrointestinal tract.  The patient and her son would like for her to be evaluated in order to  determine the source. She does not want to wait and see what happens  next.  RECOMMENDATIONS: She will continue to hold the naproxen and aspirin.  Diagnostic colonoscopy to be performed later this week. She will be  prepped in [facility].  If colonoscopy is normal, then we would consider  esophagogastroduodenoscopy.  She will have CBC prior to her colonoscopy.  She should be able to go back on aspirin as soon as her evaluation is  completed and would prefer that she is on 81 or 162 mg daily instead of  325. Whether or not she can take NSAID therapy for arthritis, will  depend on her evaluation and findings.  We appreciate the opportunity to participate in the care of this nice  lady.

## 2012-02-20 NOTE — Consult Note (Signed)
Wendy Perkins, Wendy Perkins               ACCOUNT NO.:  [id number]  MEDICAL RECORD NO.:  [id number]  LOCATION:  [facility]                         FACILITY:  [facility]  PHYSICIAN:  Lionel December, M.D.    DATE OF BIRTH:  [birth year]  DATE OF CONSULTATION:  02/19/2012 DATE OF DISCHARGE:                                CONSULTATION   CONSULTING PHYSICIAN:  Kingsley Callander. Ouida Sills, MD  PRIMARY CARE PHYSICIAN:  [name]. Juanetta Gosling, M.D.  REASON FOR CONSULTATION:  Rectal bleeding.  HISTORY OF PRESENT ILLNESS:  The patient is a 78 year old Caucasian female who was in usual state of health until February 14, 2012, when she felt an urge to have a BM and instead passed large amount of bright red blood per rectum.  She does not recall that she had abdominal pain, nausea, vomiting, or postal symptoms.  The next morning, she passed more bright red blood per rectum, but not as much she did the day before. She did not experience any associated symptoms.  She was seen by Dr. Ouida Sills, who was covering for Dr. Juanetta Gosling.  She was hemodynamically stable.  Her hemoglobin was 14 g.  This visit was arranged.  Dr. Ouida Sills told her to go to emergency room if she has recurrent bleed.  The patient states that 2 days ago, she noted dark burgundy stools, but yesterday and today has been dark brown.  She denies abdominal pain, nausea, vomiting, heartburn, anorexia, or weight loss.  She complains of occasional dysphagia to solids, but she believes she eats too fast.  She has had problems with imbalance and dizziness/vertigo, she states this symptom has not gotten any worse since bleeding episodes.  She states her constipation is well controlled with MiraLax.  The patient also complains of dyspnea on minimal exertion, but this has not changed either.  The patient's last colonoscopy was over 20 years ago.  The patient has been on full dose aspirin and naproxen and Dr. Ouida Sills advised her to stop both of these medications.  CURRENT MEDICATIONS: 1.  Restasis 0.05% ophthalmic emulsion 1 drop to each eye twice daily. 2. Diltiazem 120 mg p.o. q.24 h. 3. Furosemide 20 mg p.o. daily. 4. Multivitamin with minerals 1 daily. 5. Omeprazole 20 mg p.o. q.a.m. 6. Polyethylene glycol/propylene glycol eye drops p.r.n. 7. Polyethylene glycol powder 17 g p.o. daily. 8. KCl 20 mEq daily. 9. Spiriva 18 mcg 1 puff daily. 10.Ventolin inhaler 2 puffs q.i.d. p.r.n.  PAST MEDICAL HISTORY:  She has hypertension and history of diastolic dysfunction and CHF.  She was recently diagnosed with atrial fibrillation and being evaluated by Dr. [name].  She has COPD with dyspnea on minimal exertion and she is on nasal O2 at bedtime.  She has hyperlipidemia.  She has had bilateral knee replacement several years ago.  She had open cholecystectomy with incidental appendectomy over 30 years ago.  She had surgery on her left wrist for fracture and she had immobilization for right wrist fracture.  She has had bilateral cataract surgery.  Osteoarthrosis and low back pain.  ALLERGIES:  SULFA.  FAMILY HISTORY:  Noncontributory.  SOCIAL HISTORY:  She is widow.  She lost her husband in 1995.  She  lives alone, but one of her sons lives right next door.  She has 4 children. One daughter has emphysema and rheumatoid arthritis.  Other daughter and 2 sons are in good health.  She has been retired for years, all-in-all she worked for 50 years.  She has never smoked cigarettes or drank alcohol.  PHYSICAL EXAMINATION:  VITAL SIGNS:  Weight 184.3 pounds, she is 62 inches tall, pulse 84 per minute and regular, blood pressure 130/80, respirations 16 and temperature is 98.1. HEENT:  Conjunctivae are pink.  Sclerae are nonicteric.  Oropharyngeal mucosa is normal.  Few teeth are missing, remaining dentition is in satisfactory condition. NECK:  No neck masses or thyromegaly noted. CARDIAC:  Regular rhythm.  Normal S1, S2.  No murmur or gallop noted. LUNGS:   Auscultation of lungs revealed vesicular breath sounds bilaterally with diminished intensity. ABDOMEN:  Full.  Bowel sounds are normal on palpation.  Soft abdomen with mild tenderness at LLQ.  No organomegaly or masses. RECTAL:  Dark brown stool which is heme positive. EXTREMITIES:  No clubbing or koilonychia noted.  She has 1+ edema involving her legs around her ankles.  LABORATORY DATA:  Her hemoglobin was 14 g four days ago.  ASSESSMENT:  Wendy Perkins is a very pleasant 78 year old Caucasian female who experienced an episode of rectal bleeding last week.  She did not experience hemodynamics.  She did not experience any postal symptoms. From her history, it appears she passed large amount of blood.  She has stopped spontaneously.  The patient has been on NSAID for the last year or 2, and she is also on aspirin.  She does not have any symptoms to suggest peptic ulcer disease.  She most likely has bled from her colon and perhaps from diverticulosis, but also need to rule out NSAID colopathy or arteriovenous malformations or neoplasm.  If her colonoscopy is entirely normal, only then we would consider evaluation of her upper gastrointestinal tract.  The patient and her son would like for her to be evaluated in order to determine the source.  She does not want to wait and see what happens next.  RECOMMENDATIONS:  She will continue to hold the naproxen and aspirin.  Diagnostic colonoscopy to be performed later this week.  She will be prepped in [facility].  If colonoscopy is normal, then we would consider esophagogastroduodenoscopy.  She will have CBC prior to her colonoscopy.  She should be able to go back on aspirin as soon as her evaluation is completed and would prefer that she is on 81 or 162 mg daily instead of 325.  Whether or not she can take  NSAID therapy for arthritis, will depend on her evaluation and findings.  We appreciate the opportunity to participate in the  care of this nice lady.          ______________________________ Lionel December, M.D.     NR/MEDQ  D:  02/19/2012  T:  02/20/2012  Job:  [id number]  cc:   Kingsley Callander. Ouida Sills, MD Fax: [phone]  [name]. Juanetta Gosling, M.D. Fax: [phone]

## 2012-02-21 MED ORDER — SODIUM CHLORIDE 0.45 % IV SOLN
Freq: Once | INTRAVENOUS | Status: AC
  Administered 2012-02-22: 12:00:00 via INTRAVENOUS

## 2012-02-21 MED ORDER — PEG 3350-KCL-NA BICARB-NACL 420 G PO SOLR
4000.0000 mL | Freq: Once | ORAL | Status: AC
  Administered 2012-02-22: 4000 mL via ORAL
  Filled 2012-02-21: qty 4000

## 2012-02-21 MED ORDER — PEG 3350-KCL-NABCB-NACL-NASULF 227.1 G PO PACK: 227.1000 g | PACK | Freq: Once | ORAL | Status: DC

## 2012-02-22 ENCOUNTER — Ambulatory Visit (HOSPITAL_COMMUNITY)
Admission: RE | Admit: 2012-02-22 | Discharge: 2012-02-22 | Disposition: A | Discharge status: Home / Self Care | Payer: Medicare Other | Source: Ambulatory Visit | Attending: Internal Medicine | Admitting: Internal Medicine

## 2012-02-22 ENCOUNTER — Encounter (HOSPITAL_COMMUNITY)
Admission: RE | Disposition: A | Discharge status: Home / Self Care | Payer: Self-pay | Source: Ambulatory Visit | Attending: Internal Medicine

## 2012-02-22 DIAGNOSIS — K625 Hemorrhage of anus and rectum: Secondary | ICD-10-CM

## 2012-02-22 DIAGNOSIS — I10 Essential (primary) hypertension: Secondary | ICD-10-CM | POA: Insufficient documentation

## 2012-02-22 DIAGNOSIS — K644 Residual hemorrhoidal skin tags: Secondary | ICD-10-CM

## 2012-02-22 DIAGNOSIS — K5730 Diverticulosis of large intestine without perforation or abscess without bleeding: Secondary | ICD-10-CM

## 2012-02-22 DIAGNOSIS — K5520 Angiodysplasia of colon without hemorrhage: Secondary | ICD-10-CM

## 2012-02-22 DIAGNOSIS — Z79899 Other long term (current) drug therapy: Secondary | ICD-10-CM | POA: Insufficient documentation

## 2012-02-22 HISTORY — PX: COLONOSCOPY: SHX5424

## 2012-02-22 LAB — HEMOGLOBIN AND HEMATOCRIT, BLOOD
HCT: 45.3 % (ref 36.0–46.0)
Hemoglobin: 15.1 g/dL — ABNORMAL HIGH (ref 12.0–15.0)

## 2012-02-22 SURGERY — COLONOSCOPY
Anesthesia: Moderate Sedation

## 2012-02-22 MED ORDER — MIDAZOLAM HCL 5 MG/5ML IJ SOLN
INTRAMUSCULAR | Status: AC
  Filled 2012-02-22: qty 5

## 2012-02-22 MED ORDER — MIDAZOLAM HCL 5 MG/5ML IJ SOLN
INTRAMUSCULAR | Status: DC | PRN
  Administered 2012-02-22 (×2): 1 mg via INTRAVENOUS

## 2012-02-22 MED ORDER — MEPERIDINE HCL 50 MG/ML IJ SOLN
INTRAMUSCULAR | Status: DC | PRN
  Administered 2012-02-22: 15 mg via INTRAVENOUS

## 2012-02-22 MED ORDER — MEPERIDINE HCL 50 MG/ML IJ SOLN
INTRAMUSCULAR | Status: AC
  Filled 2012-02-22: qty 1

## 2012-02-22 NOTE — Discharge Instructions (Signed)
Resume medications but do not take Naprosyn and reduce aspirin dose to 81 mg daily. No driving for [id number]. Can take Advil OTC 200 mg by mouth 3 times a day as needed with meals. Notify if rectal bleeding recurs.

## 2012-02-22 NOTE — H&P (Signed)
This is an update to history and physical from 02/19/2012. Patient has not passed any more blood per rectum since her office visit. Her H&H today is 15.1 and 45.3. She is undergoing diagnostic colonoscopy and if it is entirely normal to be followed by EGD.

## 2012-02-22 NOTE — Op Note (Signed)
COLONOSCOPY PROCEDURE REPORT  PATIENT:  Wendy Perkins  MR#:  [id number] Birthdate:  1919-07-03, 78 y.o., female Endoscopist:  Dr. Malissa Hippo, MD Referred By:  Dr. Carylon Perches, MD Procedure Date: 02/22/2012  Procedure:   Colonoscopy  Indications:  Patient is a 78 year old Caucasian female who experienced three day episode of bright red blood per rectum. She is undergoing diagnostic colonoscopy  Informed Consent:  The procedure and risks were reviewed with the patient and informed consent was obtained.  Medications:  Demerol 15 mg IV Versed 2 mg IV  Description of procedure:  After a digital rectal exam was performed, that colonoscope was advanced from the anus through the rectum and colon to the area of the cecum, ileocecal valve and appendiceal orifice. The cecum was deeply intubated. These structures were well-seen and photographed for the record. From the level of the cecum and ileocecal valve, the scope was slowly and cautiously withdrawn. The mucosal surfaces were carefully surveyed utilizing scope tip to flexion to facilitate fold flattening as needed. The scope was pulled down into the rectum where a thorough exam including retroflexion was performed.  Findings:   Prep excellent. Numerous diverticula and sigmoid colon and with  few more scattered in the rest of her colon. Single small AVM next to appendiceal orifice without stigmata of bleeding Scar at ileocecal valve indicative of healed ulcer most likely secondary to NSAID therapy. Small hemorrhoids below the dentate line.  Therapeutic/Diagnostic Maneuvers Performed:  None  Complications:  None  Cecal Withdrawal Time:  9 minutes  Impression:  Examination performed to cecum. Pancolonic diverticulosis (she has multiple diverticula and sigmoid colon and few more involving the rest of the colon). 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 Scott at ileocecal valve secondary to healed ulceration(NSAID). Small external  hemorrhoids.  Recommendations:  Resume  aspirin at 81 mg by mouth daily. Advil 200 mg daily by mouth up to three times a day with meals.  Jaki Hammerschmidt U  02/22/2012 1:49 PM  CC: Dr. Fredirick Maudlin, MD, MD & Dr. Bonnetta Barry ref. provider found

## 2012-02-26 ENCOUNTER — Encounter (HOSPITAL_COMMUNITY): Payer: Self-pay | Admitting: Internal Medicine

## 2012-04-03 ENCOUNTER — Ambulatory Visit: Payer: Medicare Other | Admitting: Cardiology

## 2012-04-17 ENCOUNTER — Ambulatory Visit (INDEPENDENT_AMBULATORY_CARE_PROVIDER_SITE_OTHER): Payer: Medicare Other | Admitting: Cardiology

## 2012-04-17 ENCOUNTER — Encounter: Payer: Self-pay | Admitting: Cardiology

## 2012-04-17 VITALS — BP 140/80 | HR 71 | Resp 16 | Ht 62.0 in | Wt 181.0 lb

## 2012-04-17 DIAGNOSIS — I4891 Unspecified atrial fibrillation: Secondary | ICD-10-CM

## 2012-04-17 DIAGNOSIS — I10 Essential (primary) hypertension: Secondary | ICD-10-CM

## 2012-04-17 NOTE — Assessment & Plan Note (Signed)
Plan to continue strategy of heart rate control with Cardizem CD, stay on aspirin as long as tolerated. No plan for anticoagulation or cardioversion based on prior discussions.

## 2012-04-17 NOTE — Patient Instructions (Signed)
**Note De-identified  Obfuscation** Your physician recommends that you continue on your current medications as directed. Please refer to the Current Medication list given to you today.  Your physician recommends that you schedule a follow-up appointment in: 4 months  

## 2012-04-17 NOTE — Assessment & Plan Note (Signed)
Blood pressure is reasonable today. No changes made. Would be reluctant to be too aggressive with control given her intermittent symptoms of dizziness.

## 2012-04-17 NOTE — Progress Notes (Signed)
Clinical Summary Wendy Perkins is a 78 y.o.female presenting for followup. She was seen in February of this year. Record review finds evaluation for rectal bleeding back in March by Dr. Karilyn Cota. Patient underwent colonoscopy demonstrating pancolonic diverticulosis, small cecal AVM, and evidence of a healed ulceration with small external hemorrhoids. Notes indicate that she could continue on aspirin.  She reports no major progression in palpitations or shortness of breath, still has intermittent dizziness but no syncope. Main limitation is due to back pain. She is still using a cane to ambulate.   Allergies  Allergen Reactions  . Sulfur Swelling    Rash also per patient    Current Outpatient Prescriptions  Medication Sig Dispense Refill  . cycloSPORINE (RESTASIS) 0.05 % ophthalmic emulsion Place 1 drop into both eyes every 12 (twelve) hours.       Marland Kitchen diltiazem (CARDIZEM CD) 120 MG 24 hr capsule Take 1 capsule (120 mg total) by mouth daily.  90 capsule  3  . furosemide (LASIX) 20 MG tablet Take 1 tablet (20 mg total) by mouth daily.  30 tablet  3  . Multiple Vitamin (MULITIVITAMIN WITH MINERALS) TABS Take 1 tablet by mouth daily.      Marland Kitchen omeprazole (PRILOSEC) 20 MG capsule Take 20 mg by mouth daily.        Bertram Gala Glycol-Propyl Glycol (SYSTANE OP) Apply 1 drop to eye as directed. For dry eyes      . polyethylene glycol powder (GLYCOLAX/MIRALAX) powder Take 17 g by mouth as needed.       . potassium chloride SA (K-DUR,KLOR-CON) 20 MEQ tablet Take 1 tablet (20 mEq total) by mouth daily.  30 tablet  3  . tiotropium (SPIRIVA) 18 MCG inhalation capsule Place 18 mcg into inhaler and inhale daily.        . VENTOLIN HFA 108 (90 BASE) MCG/ACT inhaler Inhale 1 puff into the lungs every 4 (four) hours as needed. For shortness of breath      . DISCONTD: Albuterol (VENTOLIN IN) Inhale into the lungs.        Marland Kitchen DISCONTD: furosemide (LASIX) 20 MG tablet Take 1 tablet (20 mg total) by mouth 2 (two) times  daily.  90 tablet  3    Past Medical History  Diagnosis Date  . Dyspnea     Previous CPX suggesting possible restrictive physiology, respiratory muscle fatigue, diastolic dysfunction  . Seasonal allergies   . Hyperlipidemia   . Essential hypertension, benign   . Carotid artery disease   . Dizziness   . Atrial fibrillation     Social History Wendy Perkins reports that she has never smoked. She has never used smokeless tobacco. Wendy Perkins reports that she does not drink alcohol.  Review of Systems No further bleeding noted. Good appetite. Otherwise negative.  Physical Examination Filed Vitals:   04/17/12 1402  BP: 140/80  Pulse: 71  Resp: 16   Overweight elderly woman in no acute distress.  HEENT: Conjunctiva and lids normal, oropharynx with moist mucosa.  Neck: Supple, no elevated JVP or bruits.  Lungs: Clear to auscultation, nonlabored.  Cardiac: Irregularly irregular, no S3.  Extremities: 1-2+ ankle edema, right greater than left.    Problem List and Plan   Atrial fibrillation Plan to continue strategy of heart rate control with Cardizem CD, stay on aspirin as long as tolerated. No plan for anticoagulation or cardioversion based on prior discussions.  ESSENTIAL HYPERTENSION, BENIGN Blood pressure is reasonable today. No changes made. Would be reluctant  to be too aggressive with control given her intermittent symptoms of dizziness.     Jonelle Sidle, M.D., F.A.C.C.

## 2012-04-23 ENCOUNTER — Other Ambulatory Visit (HOSPITAL_COMMUNITY): Payer: Self-pay | Admitting: Pulmonary Disease

## 2012-04-23 DIAGNOSIS — M545 Low back pain: Secondary | ICD-10-CM

## 2012-04-25 ENCOUNTER — Ambulatory Visit (HOSPITAL_COMMUNITY)
Admission: RE | Admit: 2012-04-25 | Discharge: 2012-04-25 | Disposition: A | Discharge status: Home / Self Care | Payer: Medicare Other | Source: Ambulatory Visit | Attending: Pulmonary Disease | Admitting: Pulmonary Disease

## 2012-04-25 DIAGNOSIS — M5126 Other intervertebral disc displacement, lumbar region: Secondary | ICD-10-CM | POA: Insufficient documentation

## 2012-04-25 DIAGNOSIS — M48061 Spinal stenosis, lumbar region without neurogenic claudication: Secondary | ICD-10-CM | POA: Insufficient documentation

## 2012-04-25 DIAGNOSIS — M545 Low back pain, unspecified: Secondary | ICD-10-CM | POA: Insufficient documentation

## 2012-05-23 ENCOUNTER — Other Ambulatory Visit (HOSPITAL_COMMUNITY): Payer: Self-pay | Admitting: Pulmonary Disease

## 2012-05-23 DIAGNOSIS — R42 Dizziness and giddiness: Secondary | ICD-10-CM

## 2012-05-24 ENCOUNTER — Other Ambulatory Visit: Payer: Self-pay | Admitting: Adult Health

## 2012-05-27 ENCOUNTER — Ambulatory Visit (HOSPITAL_COMMUNITY)
Admission: RE | Admit: 2012-05-27 | Discharge: 2012-05-27 | Disposition: A | Discharge status: Home / Self Care | Payer: Medicare Other | Source: Ambulatory Visit | Attending: Pulmonary Disease | Admitting: Pulmonary Disease

## 2012-05-27 DIAGNOSIS — I10 Essential (primary) hypertension: Secondary | ICD-10-CM | POA: Insufficient documentation

## 2012-05-27 DIAGNOSIS — G319 Degenerative disease of nervous system, unspecified: Secondary | ICD-10-CM | POA: Insufficient documentation

## 2012-05-27 DIAGNOSIS — E785 Hyperlipidemia, unspecified: Secondary | ICD-10-CM | POA: Insufficient documentation

## 2012-05-27 DIAGNOSIS — I4891 Unspecified atrial fibrillation: Secondary | ICD-10-CM | POA: Insufficient documentation

## 2012-05-27 DIAGNOSIS — R42 Dizziness and giddiness: Secondary | ICD-10-CM

## 2012-06-27 ENCOUNTER — Other Ambulatory Visit: Payer: Self-pay | Admitting: Adult Health

## 2012-08-22 ENCOUNTER — Encounter: Payer: Self-pay | Admitting: Cardiology

## 2012-08-22 ENCOUNTER — Ambulatory Visit (INDEPENDENT_AMBULATORY_CARE_PROVIDER_SITE_OTHER): Payer: Medicare Other | Admitting: Cardiology

## 2012-08-22 VITALS — BP 140/78 | HR 80 | Wt 178.0 lb

## 2012-08-22 DIAGNOSIS — I5032 Chronic diastolic (congestive) heart failure: Secondary | ICD-10-CM

## 2012-08-22 DIAGNOSIS — I4891 Unspecified atrial fibrillation: Secondary | ICD-10-CM

## 2012-08-22 NOTE — Assessment & Plan Note (Signed)
Chronic, rate controlled. Continue ASA and Cardizem CD. No anticoagulation as noted previously.

## 2012-08-22 NOTE — Progress Notes (Signed)
   Clinical Summary Wendy Perkins is a 78 y.o.female presenting for followup. She was seen in May. She reports no new symptoms, no falls. SHe is not experiencing any significant palpitations.  ECG today shows atrial fibrillation with PVC and LAD.  She reports no bleeding episodes.   Allergies  Allergen Reactions  . Sulfur Swelling    Rash also per patient    Current Outpatient Prescriptions  Medication Sig Dispense Refill  . aspirin 81 MG tablet Take 81 mg by mouth daily.      . cycloSPORINE (RESTASIS) 0.05 % ophthalmic emulsion Place 1 drop into both eyes every 12 (twelve) hours.       Marland Kitchen diltiazem (CARDIZEM CD) 120 MG 24 hr capsule Take 1 capsule (120 mg total) by mouth daily.  90 capsule  3  . K-DUR 20 MEQ tablet TAKE (1) TABLET BY MOUTH ONCE DAILY WITH FOOD.  30 each  3  . LASIX 20 MG tablet TAKE 1 TABLET DAILY.  30 each  6  . midodrine (PROAMATINE) 2.5 MG tablet Take 2.5 mg by mouth 3 (three) times daily.      . Multiple Vitamin (MULITIVITAMIN WITH MINERALS) TABS Take 1 tablet by mouth daily.      Marland Kitchen omeprazole (PRILOSEC) 20 MG capsule Take 20 mg by mouth daily.        Bertram Gala Glycol-Propyl Glycol (SYSTANE OP) Apply 1 drop to eye as directed. For dry eyes      . polyethylene glycol powder (GLYCOLAX/MIRALAX) powder Take 17 g by mouth as needed.       . tiotropium (SPIRIVA) 18 MCG inhalation capsule Place 18 mcg into inhaler and inhale daily.        . VENTOLIN HFA 108 (90 BASE) MCG/ACT inhaler Inhale 1 puff into the lungs every 4 (four) hours as needed. For shortness of breath      . DISCONTD: Albuterol (VENTOLIN IN) Inhale into the lungs.        Marland Kitchen DISCONTD: furosemide (LASIX) 20 MG tablet Take 1 tablet (20 mg total) by mouth 2 (two) times daily.  90 tablet  3    Past Medical History  Diagnosis Date  . Dyspnea     Previous CPX suggesting possible restrictive physiology, respiratory muscle fatigue, diastolic dysfunction  . Seasonal allergies   . Hyperlipidemia   . Essential  hypertension, benign   . Carotid artery disease   . Dizziness   . Atrial fibrillation     Social History Wendy Perkins reports that she has never smoked. She has never used smokeless tobacco. Wendy Perkins reports that she does not drink alcohol.  Review of Systems Negative except as outlined.  Physical Examination Filed Vitals:   08/22/12 1308  BP: 140/78  Pulse: 80   Filed Weights   08/22/12 1308  Weight: 178 lb (80.74 kg)    Elderly woman in no acute distress.  HEENT: Conjunctiva and lids normal, oropharynx with moist mucosa.  Neck: Supple, no elevated JVP or bruits.  Lungs: Clear to auscultation, nonlabored.  Cardiac: Irregularly irregular, no S3.  Extremities: 1-2+ ankle edema, right greater than left.    Problem List and Plan   Atrial fibrillation Chronic, rate controlled. Continue ASA and Cardizem CD. No anticoagulation as noted previously.  Chronic diastolic heart failure Stable, weight down 3 pounds from last visit.    Jonelle Sidle, M.D., F.A.C.C.

## 2012-08-22 NOTE — Assessment & Plan Note (Signed)
Stable, weight down 3 pounds from last visit.

## 2012-08-22 NOTE — Patient Instructions (Addendum)
Your physician recommends that you schedule a follow-up appointment in: 6 months  

## 2012-10-20 ENCOUNTER — Other Ambulatory Visit: Payer: Self-pay | Admitting: Adult Health

## 2012-11-24 ENCOUNTER — Other Ambulatory Visit (HOSPITAL_COMMUNITY): Payer: Self-pay | Admitting: Pulmonary Disease

## 2012-11-24 ENCOUNTER — Ambulatory Visit (HOSPITAL_COMMUNITY)
Admission: RE | Admit: 2012-11-24 | Discharge: 2012-11-24 | Disposition: A | Discharge status: Home / Self Care | Payer: Medicare Other | Source: Ambulatory Visit | Attending: Pulmonary Disease | Admitting: Pulmonary Disease

## 2012-11-24 DIAGNOSIS — M25519 Pain in unspecified shoulder: Secondary | ICD-10-CM

## 2012-11-24 DIAGNOSIS — M47812 Spondylosis without myelopathy or radiculopathy, cervical region: Secondary | ICD-10-CM | POA: Insufficient documentation

## 2012-11-24 DIAGNOSIS — M5030 Other cervical disc degeneration, unspecified cervical region: Secondary | ICD-10-CM | POA: Insufficient documentation

## 2012-12-18 ENCOUNTER — Other Ambulatory Visit: Payer: Self-pay | Admitting: Cardiology

## 2012-12-18 NOTE — Telephone Encounter (Signed)
rx sent to pharmacy by e-script  

## 2013-03-02 ENCOUNTER — Ambulatory Visit (INDEPENDENT_AMBULATORY_CARE_PROVIDER_SITE_OTHER): Payer: Medicare Other | Admitting: Cardiology

## 2013-03-02 ENCOUNTER — Encounter: Payer: Self-pay | Admitting: Cardiology

## 2013-03-02 VITALS — BP 140/66 | HR 83 | Ht 64.0 in | Wt 183.0 lb

## 2013-03-02 DIAGNOSIS — I4891 Unspecified atrial fibrillation: Secondary | ICD-10-CM

## 2013-03-02 DIAGNOSIS — I10 Essential (primary) hypertension: Secondary | ICD-10-CM

## 2013-03-02 DIAGNOSIS — I5032 Chronic diastolic (congestive) heart failure: Secondary | ICD-10-CM

## 2013-03-02 NOTE — Assessment & Plan Note (Signed)
Continue current medications including Cardizem CD and aspirin.

## 2013-03-02 NOTE — Patient Instructions (Addendum)
Your physician recommends that you schedule a follow-up appointment in: 6 MONTHS   Your physician recommends that you continue on your current medications as directed. Please refer to the Current Medication list given to you today.  

## 2013-03-02 NOTE — Assessment & Plan Note (Signed)
Weight is up 5 pounds from the last visit, however she notes no significant change in symptomatology, does not appear any more volume overloaded. Continue current diuretic regimen.

## 2013-03-02 NOTE — Progress Notes (Signed)
Clinical Summary Wendy Perkins is a 79 y.o.female last seen in September 2013. She reports no major change in baseline dyspnea on exertion, still NYHA class 2-3. Remains functional in her ADLs, also limited by chronic back pain. She uses a cane, reports no recent falls.  Weight is up 5 pounds from the last visit. ECG today shows atrial fibrillation, PRWP, NSST changes.  Allergies  Allergen Reactions  . Sulfur Swelling    Rash also per patient    Current Outpatient Prescriptions  Medication Sig Dispense Refill  . aspirin 81 MG tablet Take 81 mg by mouth daily.      . cholecalciferol (VITAMIN D) 1000 UNITS tablet Take 1,000 Units by mouth. Three times a week      . cycloSPORINE (RESTASIS) 0.05 % ophthalmic emulsion Place 1 drop into both eyes every 12 (twelve) hours.       Marland Kitchen diltiazem (CARDIZEM CD) 120 MG 24 hr capsule TAKE ONE CAPSULE DAILY.  30 capsule  3  . fluticasone (FLONASE) 50 MCG/ACT nasal spray Place 2 sprays into the nose daily.      Marland Kitchen K-DUR 20 MEQ tablet TAKE (1) TABLET BY MOUTH ONCE DAILY WITH FOOD.  30 tablet  6  . LASIX 20 MG tablet TAKE 1 TABLET DAILY.  30 each  6  . midodrine (PROAMATINE) 2.5 MG tablet Take 2.5 mg by mouth 3 (three) times daily.      . Multiple Vitamin (MULITIVITAMIN WITH MINERALS) TABS Take 1 tablet by mouth daily.      Marland Kitchen omeprazole (PRILOSEC) 20 MG capsule Take 20 mg by mouth daily.        Bertram Gala Glycol-Propyl Glycol (SYSTANE OP) Apply 1 drop to eye as directed. For dry eyes      . polyethylene glycol powder (GLYCOLAX/MIRALAX) powder Take 17 g by mouth as needed.       . tiotropium (SPIRIVA) 18 MCG inhalation capsule Place 18 mcg into inhaler and inhale daily.        . VENTOLIN HFA 108 (90 BASE) MCG/ACT inhaler Inhale 1 puff into the lungs every 4 (four) hours as needed. For shortness of breath      . vitamin E 400 UNIT capsule Take 400 Units by mouth daily.      . [DISCONTINUED] Albuterol (VENTOLIN IN) Inhale into the lungs.         No current  facility-administered medications for this visit.    Past Medical History  Diagnosis Date  . Dyspnea     Previous CPX suggesting possible restrictive physiology, respiratory muscle fatigue, diastolic dysfunction  . Seasonal allergies   . Hyperlipidemia   . Essential hypertension, benign   . Carotid artery disease   . Dizziness   . Atrial fibrillation     Social History Wendy Perkins reports that she has never smoked. She has never used smokeless tobacco. Wendy Perkins reports that she does not drink alcohol.  Review of Systems Stable appetite, no reported bleeding problems. No sense of palpitations. No chest pain. Otherwise negative.  Physical Examination Filed Vitals:   03/02/13 1433  BP: 140/66  Pulse: 83   Filed Weights   03/02/13 1433  Weight: 183 lb (83.008 kg)   No acute distress. Elderly woman in no acute distress.  HEENT: Conjunctiva and lids normal, oropharynx with moist mucosa.  Neck: Supple, no elevated JVP or bruits.  Lungs: Clear to auscultation, nonlabored.  Cardiac: Irregularly irregular, no S3.  Extremities: 1-2+ ankle edema, right greater than left.  Problem List and Plan   Atrial fibrillation Continue current medications including Cardizem CD and aspirin.  Chronic diastolic heart failure Weight is up 5 pounds from the last visit, however she notes no significant change in symptomatology, does not appear any more volume overloaded. Continue current diuretic regimen.    Jonelle Sidle, M.D., F.A.C.C.

## 2013-05-13 ENCOUNTER — Other Ambulatory Visit: Payer: Self-pay | Admitting: Cardiology

## 2013-05-25 ENCOUNTER — Telehealth: Payer: Self-pay | Admitting: *Deleted

## 2013-05-25 MED ORDER — FUROSEMIDE 20 MG PO TABS: ORAL_TABLET | ORAL | Status: DC

## 2013-05-25 NOTE — Telephone Encounter (Signed)
Medication sent via escribe.  

## 2013-07-27 ENCOUNTER — Other Ambulatory Visit: Payer: Self-pay | Admitting: Cardiology

## 2013-07-27 MED ORDER — POTASSIUM CHLORIDE CRYS ER 20 MEQ PO TBCR: 20.0000 meq | EXTENDED_RELEASE_TABLET | Freq: Every day | ORAL | Status: DC

## 2013-08-25 ENCOUNTER — Ambulatory Visit (HOSPITAL_COMMUNITY)
Admission: RE | Admit: 2013-08-25 | Discharge: 2013-08-25 | Disposition: A | Discharge status: Home / Self Care | Payer: Medicare Other | Source: Ambulatory Visit | Attending: Pulmonary Disease | Admitting: Pulmonary Disease

## 2013-08-25 ENCOUNTER — Other Ambulatory Visit (HOSPITAL_COMMUNITY): Payer: Self-pay | Admitting: Pulmonary Disease

## 2013-08-25 DIAGNOSIS — R0602 Shortness of breath: Secondary | ICD-10-CM | POA: Insufficient documentation

## 2013-09-09 ENCOUNTER — Ambulatory Visit (INDEPENDENT_AMBULATORY_CARE_PROVIDER_SITE_OTHER): Payer: Medicare Other | Admitting: Cardiology

## 2013-09-09 ENCOUNTER — Telehealth: Payer: Self-pay | Admitting: Cardiology

## 2013-09-09 VITALS — BP 138/68 | HR 70 | Ht 62.0 in | Wt 182.8 lb

## 2013-09-09 DIAGNOSIS — I4891 Unspecified atrial fibrillation: Secondary | ICD-10-CM

## 2013-09-09 DIAGNOSIS — R0609 Other forms of dyspnea: Secondary | ICD-10-CM

## 2013-09-09 DIAGNOSIS — R0600 Dyspnea, unspecified: Secondary | ICD-10-CM

## 2013-09-09 NOTE — Progress Notes (Signed)
Clinical Summary Wendy Perkins is a 79 y.o.femaleseen as a follow up patient today, she is a regular patient of Dr Diona Browner  1. Afib - denies any significant palpitaitons, no occasional SOB or dizziness. - compliant w/ diltiazem, has not been on anticoag  2. Chronic diastolic heart failure - DOE x 1 year, followed by Dr Juanetta Gosling and was told not related to her lungs. - dyspnea with short distances, notes worsening in symptoms over the last month. No orthopnea, no PND, does have some LE edema for several years - denies any chest  - compliant w/ lasix 20mg  daily. She is on midodrine for dizziness, reports significant improvement on this medicaiton - no cough, no fevers or chills.   Past Medical History  Diagnosis Date  . Dyspnea     Previous CPX suggesting possible restrictive physiology, respiratory muscle fatigue, diastolic dysfunction  . Seasonal allergies   . Hyperlipidemia   . Essential hypertension, benign   . Carotid artery disease   . Dizziness   . Atrial fibrillation      Allergies  Allergen Reactions  . Sulfur Swelling    Rash also per patient     Current Outpatient Prescriptions  Medication Sig Dispense Refill  . aspirin 81 MG tablet Take 81 mg by mouth daily.      . cholecalciferol (VITAMIN D) 1000 UNITS tablet Take 1,000 Units by mouth. Three times a week      . cycloSPORINE (RESTASIS) 0.05 % ophthalmic emulsion Place 1 drop into both eyes every 12 (twelve) hours.       Marland Kitchen diltiazem (CARDIZEM CD) 120 MG 24 hr capsule TAKE ONE CAPSULE DAILY.  30 capsule  6  . fluticasone (FLONASE) 50 MCG/ACT nasal spray Place 2 sprays into the nose daily.      . furosemide (LASIX) 20 MG tablet TAKE 1 TABLET DAILY.  30 tablet  3  . midodrine (PROAMATINE) 2.5 MG tablet Take 2.5 mg by mouth 3 (three) times daily.      . Multiple Vitamin (MULITIVITAMIN WITH MINERALS) TABS Take 1 tablet by mouth daily.      Marland Kitchen omeprazole (PRILOSEC) 20 MG capsule Take 20 mg by mouth daily.        Bertram Gala Glycol-Propyl Glycol (SYSTANE OP) Apply 1 drop to eye as directed. For dry eyes      . polyethylene glycol powder (GLYCOLAX/MIRALAX) powder Take 17 g by mouth as needed.       . potassium chloride SA (KLOR-CON M20) 20 MEQ tablet Take 1 tablet (20 mEq total) by mouth daily.  30 tablet  6  . tiotropium (SPIRIVA) 18 MCG inhalation capsule Place 18 mcg into inhaler and inhale daily.        . VENTOLIN HFA 108 (90 BASE) MCG/ACT inhaler Inhale 1 puff into the lungs every 4 (four) hours as needed. For shortness of breath      . vitamin E 400 UNIT capsule Take 400 Units by mouth daily.      . [DISCONTINUED] Albuterol (VENTOLIN IN) Inhale into the lungs.         No current facility-administered medications for this visit.     Past Surgical History  Procedure Laterality Date  . Colonoscopy  02/22/2012    Procedure: COLONOSCOPY;  Surgeon: Malissa Hippo, MD;  Location: [facility];  Service: Endoscopy;  Laterality: N/A;  100     Allergies  Allergen Reactions  . Sulfur Swelling    Rash also per patient  Family History  Problem Relation Age of Onset  . Stroke Mother   . Pneumonia Father   . Heart disease Father   . Healthy Son   . Glaucoma Daughter   . Emphysema Daughter   . Healthy Daughter      Social History Wendy Perkins reports that she has never smoked. She has never used smokeless tobacco. Wendy Perkins reports that she does not drink alcohol.   Review of Systems 12 point ROS negative other than reported in HPI  Physical Examination Filed Vitals:   09/09/13 1338  BP: 138/68  Pulse: 70   Filed Weights   09/09/13 1338  Weight: 182 lb 12 oz (82.895 kg)    Gen: resting comfortably, NAD HEENT: no scleral icterus, pupils equal round and reactive, no palptable cervical adenopathy CV: irreg, rate 65, 2/6 systolic murmur at apex, no jvd, no carotid bruits Resp: CTAB GI: soft, NT, ND NABS, no hepatosplenomegaly MSK: extremities warm, no edema.  Skin: warm, no  rash Neuro: no focal deficits Psych: Appropriate affect   Diagnostic Studies 12/12 Echo: LVEF 60-65%, mild LVH, mode LAE, PASP 33, mild to mod MR,   12/2010 PFT:  Spirometry normal, lung volumes normal, DLCO mildly reduced, no cause for SOB  08/25/13 CXR:  FINDINGS: Mild bibasilar scarring. No focal consolidation or frank interstitial edema. No pleural effusion or pneumothorax.  Mild cardiomegaly.  Degenerative changes of the visualized thoracolumbar spine. Degenerative changes of the bilateral shoulders.  IMPRESSION: No evidence of acute cardiopulmonary disease  09/09/13 Clinic EKG: afib, rate 65  Assessment and Plan  1. Afib: rate controlled on diltiazem, no on anticoagulation per previous discussions with her primary cardiologist.   2. Diastolic heart failure - no significant evidence of volume overload on exam, she is compliant w/ her bp meds and diuretic. She is on midodrine due to a history of dizziness, reports symptoms improved after starting. Will not alter this regiment  3. Dyspnea - unclear etiology, has been on going for a year with recent worsening. Seen by pulmonary Dr Juanetta Gosling and not felt to be the source. She has diastolic dysfunction but I do not see a description from most recent echo 12/12. Her afib in the setting of diastolic dysfunction may be causing her symptoms. SHe also was noted to have moderate MR on last echo. - will repeat echo to evaluate for new pathology/etiology of her DOE given her last echo was 2 years ago - follow up with Dr Diona Browner in 2-3 weeks to review results and discuss further treatment plans.     Antoine Poche, M.D., F.A.C.C.

## 2013-09-09 NOTE — Patient Instructions (Addendum)
Your physician recommends that you schedule a follow-up appointment in: 2 weeks   Your physician has requested that you have an echocardiogram. Echocardiography is a painless test that uses sound waves to create images of your heart. It provides your doctor with information about the size and shape of your heart and how well your heart's chambers and valves are working. This procedure takes approximately one hour. There are no restrictions for this procedure.  WE WILL CALL YOU WITH YOUR TEST RESULTS/INSTRUCTIONS/NEXT STEPS ONCE RECEIVED BY THE PROVIDER

## 2013-09-09 NOTE — Telephone Encounter (Signed)
Called pt to confirm current signs and symptoms, to advise her SOB has gotten worse over the past week and a half with exertion only, pt denies chest pain/swelling, pt has no way to check BP at home, noted BP WNL at PCP over a week ago per SOB and xray was given to see if pt had pneumonia and noted the x-ray came back clear  and advised that her heart was out of rhythm and this may also be the cause, pt notes she was given medication to help with her the rhythm therefore is the reason she is contacting Dr. Diona Browner instead of PCP to see if he has any further suggestions Pt made aware that the provider is currently providing care for other patients in office/hospital setting/out of office/or away from their desk. As soon as the provider responds to your concern/question/results, a member of our staff will contact you as soon as time permits. If the patient has not heard a response by the end of the business day (5pm) they will be contacted the following business day. Pt understood. Please advise

## 2013-09-09 NOTE — Telephone Encounter (Signed)
Patient needs to speak with nurse regarding being SOB  / tgs

## 2013-09-09 NOTE — Telephone Encounter (Signed)
Pt evaluated in office today per Dr Wyline Mood

## 2013-09-09 NOTE — Telephone Encounter (Signed)
Last seen earlier this year. She has a long-standing history of shortness of breath and also atrial fibrillation. If you would schedule a visit for her to be seen in the office, we may be able to better determine if anything needs to be a modified in terms of her cardiac regimen.

## 2013-09-09 NOTE — Telephone Encounter (Signed)
Pt scheduled for today 09-09-13 at 2pm, pt noted she may not get here right at 2 but will try, informed pt this will be ok, Dr Wyline Mood made aware verbally

## 2013-09-16 ENCOUNTER — Ambulatory Visit (HOSPITAL_COMMUNITY)
Admission: RE | Admit: 2013-09-16 | Discharge: 2013-09-16 | Disposition: A | Discharge status: Home / Self Care | Payer: Medicare Other | Source: Ambulatory Visit | Attending: Cardiology | Admitting: Cardiology

## 2013-09-16 DIAGNOSIS — R0609 Other forms of dyspnea: Secondary | ICD-10-CM | POA: Insufficient documentation

## 2013-09-16 DIAGNOSIS — R0989 Other specified symptoms and signs involving the circulatory and respiratory systems: Secondary | ICD-10-CM | POA: Insufficient documentation

## 2013-09-16 DIAGNOSIS — I4891 Unspecified atrial fibrillation: Secondary | ICD-10-CM

## 2013-09-16 DIAGNOSIS — I10 Essential (primary) hypertension: Secondary | ICD-10-CM | POA: Insufficient documentation

## 2013-09-16 DIAGNOSIS — R0600 Dyspnea, unspecified: Secondary | ICD-10-CM

## 2013-09-16 DIAGNOSIS — I369 Nonrheumatic tricuspid valve disorder, unspecified: Secondary | ICD-10-CM

## 2013-09-16 NOTE — Progress Notes (Signed)
[facility]   2D echo completed 09/16/2013.   Wendy Perkins, RDCS

## 2013-09-18 ENCOUNTER — Telehealth: Payer: Self-pay | Admitting: *Deleted

## 2013-09-18 NOTE — Telephone Encounter (Signed)
Patient calling about Echo results, states she is very SOB still and wondering about result.  Please call patient.

## 2013-09-18 NOTE — Telephone Encounter (Signed)
Called pt to advise the results have not been advised concerning her echo, also  Called pt to confirm current signs and symptoms, pt advised the SOB is the same as it was when she was in our office recently, pt denies Chest pain/swelling/headache/dizzyness, pt unable to check BP at home,  Pt made aware that the provider is currently providing care for other patients in office/hospital setting/out of office/or away from their desk. As soon as the provider responds to your concern/question/results, a member of our staff will contact you as soon as time permits. If the patient has not heard a response by the end of the business day (5pm) 09-21-13 they will be contacted the following business day. Pt understood, also ADVISED If your signs and symptoms worsen please seek medical attention in your local Emergency Room, do not attempt to drive yourself per further endangerment, call 911 for transport or have someone drive you to the ER as soon as possible. PLEASE ADVISE

## 2013-09-21 NOTE — Telephone Encounter (Signed)
Spoke to pt to advise results/instructions. Pt understood. Pt has apt with Dr. Diona Browner on 09-30-13 at 1:20pm, pt understood If your signs and symptoms worsen please seek medical attention in your local Emergency Room, do not attempt to drive yourself per further endangerment, call 911 for transport or have someone drive you to the ER as soon as possible.pt understood

## 2013-09-21 NOTE — Telephone Encounter (Signed)
Please inform patient her echo shows that her heart is very stiff, which is not uncommon as we age, especially if we have a history of hypertension. The combination of her stiff heart and her atrial fibrillation likely are causing her symptoms. She is a regular patient of Dr Diona Browner who I saw as an add on, she will need to have follow up with him to discuss treatment options.

## 2013-09-30 ENCOUNTER — Encounter: Payer: Self-pay | Admitting: Cardiology

## 2013-09-30 ENCOUNTER — Ambulatory Visit (INDEPENDENT_AMBULATORY_CARE_PROVIDER_SITE_OTHER): Payer: Medicare Other | Admitting: Cardiology

## 2013-09-30 VITALS — BP 162/82 | HR 84 | Ht 62.0 in | Wt 181.0 lb

## 2013-09-30 DIAGNOSIS — I4891 Unspecified atrial fibrillation: Secondary | ICD-10-CM

## 2013-09-30 DIAGNOSIS — I5032 Chronic diastolic (congestive) heart failure: Secondary | ICD-10-CM

## 2013-09-30 DIAGNOSIS — I951 Orthostatic hypotension: Secondary | ICD-10-CM | POA: Insufficient documentation

## 2013-09-30 MED ORDER — DILTIAZEM HCL ER COATED BEADS 180 MG PO CP24: 180.0000 mg | ORAL_CAPSULE | Freq: Every day | ORAL | Status: DC

## 2013-09-30 MED ORDER — FUROSEMIDE 20 MG PO TABS: ORAL_TABLET | ORAL | Status: DC

## 2013-09-30 NOTE — Patient Instructions (Addendum)
Your physician recommends that you schedule a follow-up appointment in: 6 WEEKS WITH DR MCDOWELL  Your physician has recommended you make the following change in your medication:  1. INCREASE CARDIZEM TO 180 MG DAILY 2. TAKE LASIX 20 MG EVERY OTHER DAY

## 2013-09-30 NOTE — Progress Notes (Signed)
Clinical Summary Wendy Perkins is a 79 y.o.female presenting for followup. I last saw her in March of this year. She was seen more recently in the office by Dr. Wyline Mood earlier this month with reported increasing shortness of breath. She was scheduled for a followup echocardiogram.  [name]' history includes long-standing dyspnea on exertion with previous workup including CPX testing as noted below. Suspicion has been at least a component of diastolic dysfunction complicated by atrial fibrillation. Recent echocardiogram on 10/8 reported LVEF 60-65% with apparent restricted diastolic filling pattern, MAC with mild mitral regurgitation, severely dilated left atrium, moderately dilated RV was mildly reduced contraction, severely dilated right atrium, mild to moderate tricuspid regurgitation, and PASP of only 31 mm mercury.   She is here with her son today. She really has two different symptoms. On the one hand she experiences occasional orthostatic dizziness, also a feeling of more frank vertigo. This is on a baseline of chronic dyspnea on exertion, perhaps somewhat worse recently. The problem is that management of these two issues is somewhat contradictory, on the one hand she is on ProAmatine with prior documented orthostasis, and on the other hand she is on a calcium channel blocker and low-dose diuretic for management of her diastolic dysfunction. We reviewed this today.  Weight is down 1 pound from recent visit.   Allergies  Allergen Reactions  . Sulfur Swelling    Rash also per patient    Current Outpatient Prescriptions  Medication Sig Dispense Refill  . aspirin 81 MG tablet Take 81 mg by mouth daily.      . cholecalciferol (VITAMIN D) 1000 UNITS tablet Take 1,000 Units by mouth. Three times a week      . cycloSPORINE (RESTASIS) 0.05 % ophthalmic emulsion Place 1 drop into both eyes every 12 (twelve) hours.       Marland Kitchen diltiazem (CARDIZEM CD) 180 MG 24 hr capsule Take 1 capsule (180 mg  total) by mouth daily.  30 capsule  3  . fluticasone (FLONASE) 50 MCG/ACT nasal spray Place 2 sprays into the nose daily.      . furosemide (LASIX) 20 MG tablet TAKE 1 TABLET EVERY OTHER DAY  30 tablet  3  . midodrine (PROAMATINE) 2.5 MG tablet Take 2.5 mg by mouth 3 (three) times daily.      . Multiple Vitamin (MULITIVITAMIN WITH MINERALS) TABS Take 1 tablet by mouth daily.      Marland Kitchen omeprazole (PRILOSEC) 20 MG capsule Take 20 mg by mouth daily.        Wendy Perkins (SYSTANE OP) Apply 1 drop to eye as directed. For dry eyes      . [name] powder (GLYCOLAX/MIRALAX) powder Take 17 g by mouth as needed.       . potassium chloride SA (KLOR-CON M20) 20 MEQ tablet Take 1 tablet (20 mEq total) by mouth daily.  30 tablet  6  . tiotropium (SPIRIVA) 18 MCG inhalation capsule Place 18 mcg into inhaler and inhale daily.        . VENTOLIN HFA 108 (90 BASE) MCG/ACT inhaler Inhale 1 puff into the lungs every 4 (four) hours as needed. For shortness of breath      . vitamin E 400 UNIT capsule Take 400 Units by mouth daily.      . [DISCONTINUED] Albuterol (VENTOLIN IN) Inhale into the lungs.         No current facility-administered medications for this visit.    Past Medical History  Diagnosis Date  . Dyspnea     Previous CPX suggesting possible restrictive physiology, respiratory muscle fatigue, diastolic dysfunction  . Seasonal allergies   . Hyperlipidemia   . Essential hypertension, benign   . Carotid artery disease   . Dizziness   . Atrial fibrillation     Social History Wendy Perkins reports that she has never smoked. She has never used smokeless tobacco. Wendy Perkins reports that she does not drink alcohol.  Review of Systems No chest pain. No orthopnea or PND. She has had a recent fall, but no syncope. Otherwise as outlined.  Physical Examination Filed Vitals:   09/30/13 1330  BP: 162/82  Pulse: 84   Filed Weights   09/30/13 1330  Weight: 181 lb (82.101 kg)     No acute distress.  Elderly woman in no acute distress.  HEENT: Conjunctiva and lids normal, oropharynx with moist mucosa.  Neck: Supple, no elevated JVP or bruits.  Lungs: Clear to auscultation, nonlabored.  Cardiac: Irregularly irregular, no S3.  Extremities: 1-2+ ankle edema, right greater than left.  Skin: Warm and dry. Musculoskeletal: No kyphosis. Neuropsychiatric: Alert and oriented x3, affect appropriate.   Problem List and Plan   Chronic diastolic heart failure Stable LVEF with restrictive diastolic filling pattern and severe biatrial enlargement. PASP was normal however. Somewhat of a difficult management situation with prior documented orthostasis treated with ProAmatine. Plan at this time is to increase Cardizem CD to 180 mg in the morning, change Lasix to every other day dosing. She will keep an eye on both her blood pressure (particularly when she feels dizzy), and also her weights. Office followup arranged.  Atrial fibrillation To continue Cardizem CD for rate control. Also on aspirin, she has declined anticoagulation over time.  Orthostatic hypotension Also documented problem. She was placed on ProAmatine by Dr. Juanetta Gosling. As noted above, I have asked her to check blood pressures when she does feel dizzy.    Jonelle Sidle, M.D., F.A.C.C.

## 2013-09-30 NOTE — Assessment & Plan Note (Signed)
Stable LVEF with restrictive diastolic filling pattern and severe biatrial enlargement. PASP was normal however. Somewhat of a difficult management situation with prior documented orthostasis treated with ProAmatine. Plan at this time is to increase Cardizem CD to 180 mg in the morning, change Lasix to every other day dosing. She will keep an eye on both her blood pressure (particularly when she feels dizzy), and also her weights. Office followup arranged.

## 2013-09-30 NOTE — Assessment & Plan Note (Signed)
Also documented problem. She was placed on ProAmatine by Dr. Juanetta Gosling. As noted above, I have asked her to check blood pressures when she does feel dizzy.

## 2013-09-30 NOTE — Assessment & Plan Note (Signed)
To continue Cardizem CD for rate control. Also on aspirin, she has declined anticoagulation over time.

## 2013-10-12 ENCOUNTER — Telehealth: Payer: Self-pay | Admitting: *Deleted

## 2013-10-12 NOTE — Telephone Encounter (Signed)
Noted increase of Cardizem 180mg  was sent on 09-30-13, this nurse spoke to [place] with [facility] to advise the Rx is ready for pick up pt made aware

## 2013-10-12 NOTE — Telephone Encounter (Signed)
PT states that she was told when she ran out of current RX for diltiazem we were going to switch her over to another dose. Please call new Rx into [facility]

## 2013-11-12 ENCOUNTER — Ambulatory Visit (INDEPENDENT_AMBULATORY_CARE_PROVIDER_SITE_OTHER): Payer: Medicare Other | Admitting: Cardiology

## 2013-11-12 ENCOUNTER — Encounter: Payer: Self-pay | Admitting: Cardiology

## 2013-11-12 VITALS — BP 144/80 | HR 75 | Ht 62.0 in | Wt 182.8 lb

## 2013-11-12 DIAGNOSIS — I4891 Unspecified atrial fibrillation: Secondary | ICD-10-CM

## 2013-11-12 DIAGNOSIS — I951 Orthostatic hypotension: Secondary | ICD-10-CM

## 2013-11-12 DIAGNOSIS — I5032 Chronic diastolic (congestive) heart failure: Secondary | ICD-10-CM

## 2013-11-12 NOTE — Assessment & Plan Note (Signed)
Symptoms are better with medication adjustments made last time. We will see if she can reduce ProAmatine to 2.5 mg twice daily.

## 2013-11-12 NOTE — Patient Instructions (Addendum)
Your physician recommends that you schedule a follow-up appointment in: three months    Your physician has recommended you make the following change in your medication:    Take midodrine 2.5 mg tablets twice a day ( one in the morning,the second in the afternoon)    No changes in the rest of your other medications

## 2013-11-12 NOTE — Progress Notes (Signed)
Clinical Summary Wendy Perkins is a 79 y.o.female last seen in October. At the last visit we adjusted her medications to include increasing Cardizem CD with change in Lasix to every other day.  She is here with her son, states that she does feel somewhat better. No change in baseline shortness of breath, dizziness is definitely better, still occurs occasionally. I reviewed her home blood pressure checks, no hypotension noted. Heart rate control seems to be better as well.  Wendy Perkins' history includes long-standing dyspnea on exertion with previous workup including CPX testing as noted below. Suspicion has been at least a component of diastolic dysfunction complicated by atrial fibrillation. Recent echocardiogram on 10/8 reported LVEF 60-65% with apparent restricted diastolic filling pattern, MAC with mild mitral regurgitation, severely dilated left atrium, moderately dilated RV was mildly reduced contraction, severely dilated right atrium, mild to moderate tricuspid regurgitation, and PASP of only 31 mm mercury.    Allergies  Allergen Reactions  . Sulfur Swelling    Rash also per patient    Current Outpatient Prescriptions  Medication Sig Dispense Refill  . aspirin 81 MG tablet Take 81 mg by mouth daily.      . cholecalciferol (VITAMIN D) 1000 UNITS tablet Take 1,000 Units by mouth. Three times a week      . cycloSPORINE (RESTASIS) 0.05 % ophthalmic emulsion Place 1 drop into both eyes every 12 (twelve) hours.       Marland Kitchen diltiazem (CARDIZEM CD) 180 MG 24 hr capsule Take 1 capsule (180 mg total) by mouth daily.  30 capsule  3  . fluticasone (FLONASE) 50 MCG/ACT nasal spray Place 2 sprays into the nose daily.      . furosemide (LASIX) 20 MG tablet TAKE 1 TABLET EVERY OTHER DAY  30 tablet  3  . midodrine (PROAMATINE) 2.5 MG tablet Take 2.5 mg by mouth 3 (three) times daily.      . Multiple Vitamin (MULITIVITAMIN WITH MINERALS) TABS Take 1 tablet by mouth daily.      Marland Kitchen omeprazole (PRILOSEC) 20  MG capsule Take 20 mg by mouth daily.        Bertram Gala Glycol-Propyl Glycol (SYSTANE OP) Apply 1 drop to eye as directed. For dry eyes      . polyethylene glycol powder (GLYCOLAX/MIRALAX) powder Take 17 g by mouth as needed.       . potassium chloride SA (KLOR-CON M20) 20 MEQ tablet Take 1 tablet (20 mEq total) by mouth daily.  30 tablet  6  . tiotropium (SPIRIVA) 18 MCG inhalation capsule Place 18 mcg into inhaler and inhale daily.        . VENTOLIN HFA 108 (90 BASE) MCG/ACT inhaler Inhale 1 puff into the lungs every 4 (four) hours as needed. For shortness of breath      . vitamin E 400 UNIT capsule Take 400 Units by mouth daily.      . [DISCONTINUED] Albuterol (VENTOLIN IN) Inhale into the lungs.         No current facility-administered medications for this visit.    Past Medical History  Diagnosis Date  . Dyspnea     Previous CPX suggesting possible restrictive physiology, respiratory muscle fatigue, diastolic dysfunction  . Seasonal allergies   . Hyperlipidemia   . Essential hypertension, benign   . Carotid artery disease   . Dizziness   . Atrial fibrillation     Social History Wendy Perkins reports that she has never smoked. She has never used smokeless tobacco.  Wendy Perkins reports that she does not drink alcohol.  Review of Systems Negative except as outlined.  Physical Examination Filed Vitals:   11/12/13 1058  BP: 144/80  Pulse: 75   Filed Weights   11/12/13 1058  Weight: 182 lb 12 oz (82.895 kg)    Appears comfortable at rest.  Elderly woman in no acute distress.  HEENT: Conjunctiva and lids normal, oropharynx with moist mucosa.  Neck: Supple, no elevated JVP or bruits.  Lungs: Clear to auscultation, nonlabored.  Cardiac: Irregularly irregular, no S3.  Extremities: 1-2+ ankle edema, right greater than left.  Neuropsychiatric: Alert and oriented x3, affect appropriate.   Problem List and Plan   Atrial fibrillation Continue cardizem CD at current dose for  rate control. Also on aspirin, she has declined anticoagulation over time.  Orthostatic hypotension Symptoms are better with medication adjustments made last time. We will see if she can reduce ProAmatine to 2.5 mg twice daily.  Chronic diastolic heart failure Continue Cardizem CD and Lasix.    Jonelle Sidle, M.D., F.A.C.C.

## 2013-11-12 NOTE — Assessment & Plan Note (Signed)
Continue Cardizem CD and Lasix.

## 2013-11-12 NOTE — Assessment & Plan Note (Signed)
Continue cardizem CD at current dose for rate control. Also on aspirin, she has declined anticoagulation over time.

## 2014-02-09 ENCOUNTER — Other Ambulatory Visit: Payer: Self-pay | Admitting: Cardiology

## 2014-02-10 ENCOUNTER — Ambulatory Visit (INDEPENDENT_AMBULATORY_CARE_PROVIDER_SITE_OTHER): Payer: Medicare Other | Admitting: Cardiology

## 2014-02-10 ENCOUNTER — Encounter: Payer: Self-pay | Admitting: Cardiology

## 2014-02-10 VITALS — BP 137/70 | HR 76 | Ht 62.0 in | Wt 178.0 lb

## 2014-02-10 DIAGNOSIS — I4891 Unspecified atrial fibrillation: Secondary | ICD-10-CM

## 2014-02-10 DIAGNOSIS — I951 Orthostatic hypotension: Secondary | ICD-10-CM

## 2014-02-10 NOTE — Assessment & Plan Note (Signed)
Improved.  Continue current regimen

## 2014-02-10 NOTE — Assessment & Plan Note (Signed)
Continue current regimen including aspirin and Cardizem CD. She declines anticoagulation.

## 2014-02-10 NOTE — Patient Instructions (Signed)
Continue all current medications. Follow up in  3 months 

## 2014-02-10 NOTE — Progress Notes (Signed)
Clinical Summary Wendy Perkins is a 80 y.o.female last seen in December 2014. She is here with her son. Has been doing reasonably well from a cardiac perspective. No sense of palpitations. No falls or syncope. She continues to use a cane to ambulate, has chronic knee pain. She will be seeing her orthopedist soon. No change in baseline dyspnea on exertion.  Wendy Perkins' history includes long-standing dyspnea on exertion with previous workup including CPX testing as noted below. Suspicion has been at least a component of diastolic dysfunction complicated by atrial fibrillation. Recent echocardiogram on 10/8 reported LVEF 60-65% with apparent restricted diastolic filling pattern, MAC with mild mitral regurgitation, severely dilated left atrium, moderately dilated RV was mildly reduced contraction, severely dilated right atrium, mild to moderate tricuspid regurgitation, and PASP of only 31 mm mercury.    Allergies  Allergen Reactions  . Sulfur Swelling    Rash also per patient    Current Outpatient Prescriptions  Medication Sig Dispense Refill  . aspirin 81 MG tablet Take 81 mg by mouth daily.      . cycloSPORINE (RESTASIS) 0.05 % ophthalmic emulsion Place 1 drop into both eyes every 12 (twelve) hours.       Marland Kitchen diltiazem (CARDIZEM CD) 180 MG 24 hr capsule TAKE ONE CAPSULE DAILY.  30 capsule  6  . fluticasone (FLONASE) 50 MCG/ACT nasal spray Place 2 sprays into the nose daily as needed.       . furosemide (LASIX) 20 MG tablet TAKE 1 TABLET EVERY OTHER DAY  30 tablet  3  . ibuprofen (ADVIL,MOTRIN) 200 MG tablet Take 200 mg by mouth 2 (two) times daily.      . midodrine (PROAMATINE) 2.5 MG tablet Take 2.5 mg by mouth 3 (three) times daily.      . Multiple Vitamin (MULITIVITAMIN WITH MINERALS) TABS Take 1 tablet by mouth daily.      Marland Kitchen omeprazole (PRILOSEC) 20 MG capsule Take 20 mg by mouth daily.        Vladimir Faster Glycol-Propyl Glycol (SYSTANE OP) Apply 1 drop to eye as directed. For dry eyes        . polyethylene glycol powder (GLYCOLAX/MIRALAX) powder Take 17 g by mouth as needed.       . potassium chloride SA (K-DUR,KLOR-CON) 20 MEQ tablet Take 20 mEq by mouth every other day. Takes on the days that she takes her Lasix.      Marland Kitchen tiotropium (SPIRIVA) 18 MCG inhalation capsule Place 18 mcg into inhaler and inhale daily.        . VENTOLIN HFA 108 (90 BASE) MCG/ACT inhaler Inhale 1 puff into the lungs every 4 (four) hours as needed. For shortness of breath      . [DISCONTINUED] Albuterol (VENTOLIN IN) Inhale into the lungs.         No current facility-administered medications for this visit.    Past Medical History  Diagnosis Date  . Dyspnea     Previous CPX suggesting possible restrictive physiology, respiratory muscle fatigue, diastolic dysfunction  . Seasonal allergies   . Hyperlipidemia   . Essential hypertension, benign   . Carotid artery disease   . Dizziness   . Atrial fibrillation     Social History Wendy Perkins reports that she has never smoked. She has never used smokeless tobacco. Wendy Perkins reports that she does not drink alcohol.  Review of Systems Part of hearing. Stable appetite. No bleeding episodes. Otherwise negative except as outlined.  Physical Examination Filed Vitals:  02/10/14 1400  BP: 137/70  Pulse: 76   Filed Weights   02/10/14 1400  Weight: 178 lb (80.74 kg)    Appears comfortable at rest.  Elderly woman in no acute distress.  HEENT: Conjunctiva and lids normal, oropharynx with moist mucosa.  Neck: Supple, no elevated JVP or bruits.  Lungs: Clear to auscultation, nonlabored.  Cardiac: Irregularly irregular, no S3.  Extremities: 1-2+ ankle edema, right greater than left.  Neuropsychiatric: Alert and oriented x3, affect appropriate.   Problem List and Plan   Atrial fibrillation Continue current regimen including aspirin and Cardizem CD. She declines anticoagulation.  Orthostatic hypotension Improved. Continue current  regimen.    Satira Sark, M.D., F.A.C.C.

## 2014-05-21 ENCOUNTER — Encounter: Payer: Self-pay | Admitting: Cardiology

## 2014-05-21 ENCOUNTER — Ambulatory Visit (INDEPENDENT_AMBULATORY_CARE_PROVIDER_SITE_OTHER): Payer: Medicare Other | Admitting: Cardiology

## 2014-05-21 VITALS — BP 160/68 | HR 67 | Ht 62.0 in | Wt 170.0 lb

## 2014-05-21 DIAGNOSIS — I5032 Chronic diastolic (congestive) heart failure: Secondary | ICD-10-CM

## 2014-05-21 DIAGNOSIS — I4891 Unspecified atrial fibrillation: Secondary | ICD-10-CM

## 2014-05-21 NOTE — Assessment & Plan Note (Signed)
Continue Cardizem CD and Lasix with potassium supplementation. Weight is down 8 pounds.

## 2014-05-21 NOTE — Progress Notes (Signed)
Clinical Summary Wendy Perkins is a 80 y.o.female last seen in March of this year. She has had no major change in overall health status. No major complaint of palpitations. She has since come off of ProAmatine. Actually states her dizziness is better. She will be seeing Dr. Luan Pulling for a visit in the next few weeks.  No change in cardiac regimen, remains on Cardizem CD and aspirin, has declined anticoagulation.  Wendy Perkins' history includes long-standing dyspnea on exertion with previous workup including CPX testing as noted below. Suspicion has been at least a component of diastolic dysfunction complicated by atrial fibrillation. Recent echocardiogram on 10/8 reported LVEF 60-65% with apparent restricted diastolic filling pattern, MAC with mild mitral regurgitation, severely dilated left atrium, moderately dilated RV was mildly reduced contraction, severely dilated right atrium, mild to moderate tricuspid regurgitation, and PASP of only 31 mm mercury.    Allergies  Allergen Reactions  . Sulfur Swelling    Rash also per patient    Current Outpatient Prescriptions  Medication Sig Dispense Refill  . aspirin 81 MG tablet Take 81 mg by mouth daily.      . cycloSPORINE (RESTASIS) 0.05 % ophthalmic emulsion Place 1 drop into both eyes every 12 (twelve) hours.       Marland Kitchen diltiazem (CARDIZEM CD) 180 MG 24 hr capsule TAKE ONE CAPSULE DAILY.  30 capsule  6  . fluticasone (FLONASE) 50 MCG/ACT nasal spray Place 2 sprays into the nose daily as needed.       . furosemide (LASIX) 20 MG tablet TAKE 1 TABLET EVERY OTHER DAY  30 tablet  3  . ibuprofen (ADVIL,MOTRIN) 200 MG tablet Take 200 mg by mouth 2 (two) times daily.      . Multiple Vitamin (MULITIVITAMIN WITH MINERALS) TABS Take 1 tablet by mouth daily.      Marland Kitchen omeprazole (PRILOSEC) 20 MG capsule Take 20 mg by mouth daily.        Vladimir Faster Glycol-Propyl Glycol (SYSTANE OP) Apply 1 drop to eye as directed. For dry eyes      . polyethylene glycol powder  (GLYCOLAX/MIRALAX) powder Take 17 g by mouth as needed.       . potassium chloride SA (K-DUR,KLOR-CON) 20 MEQ tablet Take 20 mEq by mouth every other day. Takes on the days that she takes her Lasix.      Marland Kitchen tiotropium (SPIRIVA) 18 MCG inhalation capsule Place 18 mcg into inhaler and inhale daily.        . VENTOLIN HFA 108 (90 BASE) MCG/ACT inhaler Inhale 1 puff into the lungs every 4 (four) hours as needed. For shortness of breath      . [DISCONTINUED] Albuterol (VENTOLIN IN) Inhale into the lungs.         No current facility-administered medications for this visit.    Past Medical History  Diagnosis Date  . Dyspnea     Previous CPX suggesting possible restrictive physiology, respiratory muscle fatigue, diastolic dysfunction  . Seasonal allergies   . Hyperlipidemia   . Essential hypertension, benign   . Carotid artery disease   . Dizziness   . Atrial fibrillation     Social History Wendy Perkins reports that she has never smoked. She has never used smokeless tobacco. Wendy Perkins reports that she does not drink alcohol.  Review of Systems Had a recent bee sting on her hand. Uses a cane, no falls. Otherwise negative.  Physical Examination Filed Vitals:   05/21/14 1356  BP: 160/68  Pulse:  [place]   05/21/14 1356  Weight: 170 lb (77.111 kg)    Appears comfortable at rest.  Elderly woman in no acute distress.  HEENT: Conjunctiva and lids normal, oropharynx with moist mucosa.  Neck: Supple, no elevated JVP or bruits.  Lungs: Clear to auscultation, nonlabored.  Cardiac: Irregularly irregular, no S3.  Extremities: 1-2+ ankle edema, right greater than left.  Neuropsychiatric: Alert and oriented x3, affect appropriate.   Problem List and Plan   Atrial fibrillation Continue current strategy of heart rate control with Cardizem CD. She remains on aspirin, has declined anticoagulation.  Chronic diastolic heart failure Continue Cardizem CD and Lasix with potassium  supplementation. Weight is down 8 pounds.    Satira Sark, M.D., F.A.C.C.

## 2014-05-21 NOTE — Assessment & Plan Note (Signed)
Continue current strategy of heart rate control with Cardizem CD. She remains on aspirin, has declined anticoagulation.

## 2014-05-21 NOTE — Patient Instructions (Signed)
Your physician recommends that you schedule a follow-up appointment in: 4 months     Your physician recommends that you continue on your current medications as directed. Please refer to the Current Medication list given to you today.      Thank you for choosing [facility] !         

## 2014-07-30 ENCOUNTER — Telehealth: Payer: Self-pay | Admitting: Adult Health

## 2014-07-30 MED ORDER — POTASSIUM CHLORIDE CRYS ER 20 MEQ PO TBCR: 20.0000 meq | EXTENDED_RELEASE_TABLET | ORAL | Status: DC

## 2014-07-30 NOTE — Telephone Encounter (Signed)
Received fax refill request  Rx # [id number] Medication:  Potassium CL 20 meq tablet Qty 30 Sig:  Take one tablet by mouth once daily with food  Physician:  Purcell Nails

## 2014-08-20 ENCOUNTER — Other Ambulatory Visit: Payer: Self-pay | Admitting: Cardiology

## 2014-08-20 NOTE — Telephone Encounter (Signed)
Medication sent to pharmacy  

## 2014-09-07 ENCOUNTER — Other Ambulatory Visit: Payer: Self-pay | Admitting: Cardiology

## 2014-09-07 NOTE — Telephone Encounter (Signed)
Medication sent to pharmacy  

## 2014-09-20 ENCOUNTER — Encounter: Payer: Self-pay | Admitting: Cardiology

## 2014-10-05 ENCOUNTER — Ambulatory Visit: Payer: Medicare Other | Admitting: Cardiology

## 2014-10-25 ENCOUNTER — Ambulatory Visit: Payer: Medicare Other | Admitting: Cardiology

## 2014-10-27 ENCOUNTER — Ambulatory Visit (INDEPENDENT_AMBULATORY_CARE_PROVIDER_SITE_OTHER): Payer: Medicare Other | Admitting: Cardiology

## 2014-10-27 ENCOUNTER — Encounter: Payer: Self-pay | Admitting: Cardiology

## 2014-10-27 VITALS — BP 138/70 | HR 88 | Ht 62.0 in | Wt 171.0 lb

## 2014-10-27 DIAGNOSIS — I482 Chronic atrial fibrillation, unspecified: Secondary | ICD-10-CM

## 2014-10-27 DIAGNOSIS — I10 Essential (primary) hypertension: Secondary | ICD-10-CM

## 2014-10-27 NOTE — Addendum Note (Signed)
Addended by: Julian Hy T on: 10/27/2014 11:30 AM   Modules accepted: Orders, Level of Service

## 2014-10-27 NOTE — Progress Notes (Signed)
Reason for visit: Atrial fibrillation, hypertension  Clinical Summary Wendy Perkins is a 80 y.o.female last seen in June. She is here with her son today for a routine visit. She reports no major change in functional capacity, no palpitations or chest pain. Has chronic NYHA class 2-3 dyspnea which has not changed in intensity. We reviewed her cardiac medications. She continues on Cardizem CD and aspirin. Has consistently declined anticoagulation. ECG today shows rate-controlled atrial fibrillation with low voltage and leftward axis.  Wendy Perkins' history includes long-standing dyspnea on exertion with previous workup including CPX testing as noted below. Suspicion has been at least a component of diastolic dysfunction complicated by atrial fibrillation. Recent echocardiogram on 10/8 reported LVEF 60-65% with apparent restricted diastolic filling pattern, MAC with mild mitral regurgitation, severely dilated left atrium, moderately dilated RV was mildly reduced contraction, severely dilated right atrium, mild to moderate tricuspid regurgitation, and PASP of only 31 mm mercury.   Allergies  Allergen Reactions  . Sulfur Swelling    Rash also per patient    Current Outpatient Prescriptions  Medication Sig Dispense Refill  . ANORO ELLIPTA 62.5-25 MCG/INH AEPB     . aspirin 81 MG tablet Take 81 mg by mouth daily.    . cycloSPORINE (RESTASIS) 0.05 % ophthalmic emulsion Place 1 drop into both eyes every 12 (twelve) hours.     Marland Kitchen diltiazem (CARDIZEM CD) 180 MG 24 hr capsule TAKE ONE CAPSULE BY MOUTH ONCE DAILY. 30 capsule 3  . fluticasone (FLONASE) 50 MCG/ACT nasal spray Place 2 sprays into the nose daily as needed.     . furosemide (LASIX) 20 MG tablet TAKE 1 TABLET BY MOUTH DAILY. 30 tablet 3  . ibuprofen (ADVIL,MOTRIN) 200 MG tablet Take 200 mg by mouth 2 (two) times daily.    . Multiple Vitamin (MULITIVITAMIN WITH MINERALS) TABS Take 1 tablet by mouth daily.    Marland Kitchen omeprazole (PRILOSEC) 20 MG capsule  Take 20 mg by mouth daily.      Vladimir Faster Glycol-Propyl Glycol (SYSTANE OP) Apply 1 drop to eye as directed. For dry eyes    . polyethylene glycol powder (GLYCOLAX/MIRALAX) powder Take 17 g by mouth as needed.     . potassium chloride SA (K-DUR,KLOR-CON) 20 MEQ tablet Take 1 tablet (20 mEq total) by mouth every other day. Takes on the days that she takes her Lasix. 30 tablet 3  . tiotropium (SPIRIVA) 18 MCG inhalation capsule Place 18 mcg into inhaler and inhale daily.      . VENTOLIN HFA 108 (90 BASE) MCG/ACT inhaler Inhale 1 puff into the lungs every 4 (four) hours as needed. For shortness of breath    . [DISCONTINUED] Albuterol (VENTOLIN IN) Inhale into the lungs.       No current facility-administered medications for this visit.    Past Medical History  Diagnosis Date  . Dyspnea     Previous CPX suggesting possible restrictive physiology, respiratory muscle fatigue, diastolic dysfunction  . Seasonal allergies   . Hyperlipidemia   . Essential hypertension, benign   . Carotid artery disease   . Dizziness   . Atrial fibrillation      Social History Ms. Alvizo reports that she has never smoked. She has never used smokeless tobacco. Ms. Scheier reports that she does not drink alcohol.  Review of Systems Complete review of systems negative except as otherwise outlined in the clinical summary.  Physical Examination Filed Vitals:   10/27/14 1032  BP: 138/70  Pulse: 88  Filed Weights   10/27/14 1032  Weight: 171 lb (77.565 kg)    Appears comfortable at rest.  Elderly woman in no acute distress.  HEENT: Conjunctiva and lids normal, oropharynx with moist mucosa.  Neck: Supple, no elevated JVP or bruits.  Lungs: Clear to auscultation, nonlabored.  Cardiac: Irregularly irregular, no S3.  Extremities: 1+ ankle edema, right greater than left.  Neuropsychiatric: Alert and oriented x3, affect appropriate.   Problem List and Plan   Atrial fibrillation Continue current  medications including Cardizem CD and aspirin. She has declined anticoagulation. Symptomatically stable.  Essential hypertension, benign Blood pressure is fairly well controlled today.    Satira Sark, M.D., F.A.C.C.

## 2014-10-27 NOTE — Assessment & Plan Note (Signed)
Blood pressure is fairly well controlled today.

## 2014-10-27 NOTE — Assessment & Plan Note (Signed)
Continue current medications including Cardizem CD and aspirin. She has declined anticoagulation. Symptomatically stable.

## 2014-10-27 NOTE — Patient Instructions (Signed)
Your physician wants you to follow-up in: 6 months with Dr. McDowell You will receive a reminder letter in the mail two months in advance. If you don't receive a letter, please call our office to schedule the follow-up appointment.  Your physician recommends that you continue on your current medications as directed. Please refer to the Current Medication list given to you today.  Thank you for choosing [facility]!!    

## 2014-11-15 ENCOUNTER — Ambulatory Visit (HOSPITAL_COMMUNITY)
Admission: RE | Admit: 2014-11-15 | Discharge: 2014-11-15 | Disposition: A | Discharge status: Home / Self Care | Payer: Medicare Other | Source: Ambulatory Visit | Attending: Adult Health | Admitting: Adult Health

## 2014-11-15 ENCOUNTER — Encounter: Payer: Self-pay | Admitting: Adult Health

## 2014-11-15 ENCOUNTER — Ambulatory Visit (INDEPENDENT_AMBULATORY_CARE_PROVIDER_SITE_OTHER): Payer: Medicare Other | Admitting: Adult Health

## 2014-11-15 ENCOUNTER — Telehealth: Payer: Self-pay | Admitting: Adult Health

## 2014-11-15 VITALS — BP 170/70 | Ht 62.0 in | Wt 174.0 lb

## 2014-11-15 DIAGNOSIS — R0781 Pleurodynia: Secondary | ICD-10-CM

## 2014-11-15 DIAGNOSIS — S299XXA Unspecified injury of thorax, initial encounter: Secondary | ICD-10-CM | POA: Insufficient documentation

## 2014-11-15 DIAGNOSIS — N63 Unspecified lump in unspecified breast: Secondary | ICD-10-CM

## 2014-11-15 DIAGNOSIS — W1809XA Striking against other object with subsequent fall, initial encounter: Secondary | ICD-10-CM | POA: Insufficient documentation

## 2014-11-15 DIAGNOSIS — Y92009 Unspecified place in unspecified non-institutional (private) residence as the place of occurrence of the external cause: Secondary | ICD-10-CM | POA: Diagnosis not present

## 2014-11-15 DIAGNOSIS — N644 Mastodynia: Secondary | ICD-10-CM | POA: Insufficient documentation

## 2014-11-15 HISTORY — DX: Mastodynia: N64.4

## 2014-11-15 HISTORY — DX: Pleurodynia: R07.81

## 2014-11-15 HISTORY — DX: Unspecified lump in unspecified breast: N63.0

## 2014-11-15 NOTE — Patient Instructions (Signed)
Go get ribs xray now Mammogram 12/15 at 2:30 pm Follow up prm

## 2014-11-15 NOTE — Telephone Encounter (Signed)
Pt aware no ribs were fractured,try using ice for 10 minutes on 30 minutes off and try tylenol

## 2014-11-15 NOTE — Progress Notes (Signed)
Subjective:     Patient ID: Wendy Perkins, female   DOB: July 14, 1919, 80 y.o.   MRN: [id number]  HPI Wendy Perkins is a 80 year old white female, widowed in complaining of pain in left breast area.She fell last night in her bedroom.She has life line and it did not go off, but son came and helped her up.She walks with a cane.She is breathing OK but it hurts with deep breath.She gets short of breath with exertion and has for years, no change now.  Review of Systems See HPI Reviewed past medical,surgical, social and family history. Reviewed medications and allergies.     Objective:   Physical Exam BP 170/70 mmHg  Ht 5\' 2"  (1.575 m)  Wt 174 lb (78.926 kg)  BMI 31.82 kg/m2    Skin warm and dry,  Breasts:no dominate palpable mass, retraction or nipple discharge on left, on right no retraction or nipple discharge, has 1 cm nodule at 6 o'clock that is tender, has pain in left ribs too, no bruising or swelling noted. She said she fell 2 years ago and broke both wrists.She says they are weak but has no pain or deformity.She can raise left arm without difficulty but says it feels weak. She had not noticed and breast lumps prior to pain after fall, she has some skin fungus under left breast. Her son brought to appt and I spoke with him about getting x ray now and he will take her.  Assessment:    Pain in left breast Pain in left ribs Left breast nodule    Plan:    Go get left rib xray now, I will call her when results back Scheduled bilateral diagnostic mammogram 12/15 at 2:45 pm at [facility], if this is not a good time can call and change it Follow up prn

## 2014-11-23 ENCOUNTER — Ambulatory Visit (HOSPITAL_COMMUNITY)
Admission: RE | Admit: 2014-11-23 | Discharge: 2014-11-23 | Disposition: A | Discharge status: Home / Self Care | Payer: Medicare Other | Source: Ambulatory Visit | Attending: Adult Health | Admitting: Adult Health

## 2014-11-23 DIAGNOSIS — W19XXXA Unspecified fall, initial encounter: Secondary | ICD-10-CM | POA: Insufficient documentation

## 2014-11-23 DIAGNOSIS — N63 Unspecified lump in unspecified breast: Secondary | ICD-10-CM

## 2014-11-23 DIAGNOSIS — N644 Mastodynia: Secondary | ICD-10-CM | POA: Diagnosis present

## 2014-12-23 DIAGNOSIS — M5136 Other intervertebral disc degeneration, lumbar region: Secondary | ICD-10-CM | POA: Diagnosis not present

## 2014-12-23 DIAGNOSIS — M5134 Other intervertebral disc degeneration, thoracic region: Secondary | ICD-10-CM | POA: Diagnosis not present

## 2014-12-23 DIAGNOSIS — M9903 Segmental and somatic dysfunction of lumbar region: Secondary | ICD-10-CM | POA: Diagnosis not present

## 2014-12-23 DIAGNOSIS — M9902 Segmental and somatic dysfunction of thoracic region: Secondary | ICD-10-CM | POA: Diagnosis not present

## 2014-12-26 DIAGNOSIS — R0602 Shortness of breath: Secondary | ICD-10-CM | POA: Diagnosis not present

## 2014-12-26 DIAGNOSIS — R0902 Hypoxemia: Secondary | ICD-10-CM | POA: Diagnosis not present

## 2015-01-10 DIAGNOSIS — M5134 Other intervertebral disc degeneration, thoracic region: Secondary | ICD-10-CM | POA: Diagnosis not present

## 2015-01-10 DIAGNOSIS — M9902 Segmental and somatic dysfunction of thoracic region: Secondary | ICD-10-CM | POA: Diagnosis not present

## 2015-01-10 DIAGNOSIS — M5136 Other intervertebral disc degeneration, lumbar region: Secondary | ICD-10-CM | POA: Diagnosis not present

## 2015-01-10 DIAGNOSIS — M9903 Segmental and somatic dysfunction of lumbar region: Secondary | ICD-10-CM | POA: Diagnosis not present

## 2015-01-14 DIAGNOSIS — M5136 Other intervertebral disc degeneration, lumbar region: Secondary | ICD-10-CM | POA: Diagnosis not present

## 2015-01-14 DIAGNOSIS — M9902 Segmental and somatic dysfunction of thoracic region: Secondary | ICD-10-CM | POA: Diagnosis not present

## 2015-01-14 DIAGNOSIS — M5134 Other intervertebral disc degeneration, thoracic region: Secondary | ICD-10-CM | POA: Diagnosis not present

## 2015-01-14 DIAGNOSIS — M9903 Segmental and somatic dysfunction of lumbar region: Secondary | ICD-10-CM | POA: Diagnosis not present

## 2015-01-19 DIAGNOSIS — M9902 Segmental and somatic dysfunction of thoracic region: Secondary | ICD-10-CM | POA: Diagnosis not present

## 2015-01-19 DIAGNOSIS — M9903 Segmental and somatic dysfunction of lumbar region: Secondary | ICD-10-CM | POA: Diagnosis not present

## 2015-01-19 DIAGNOSIS — M5134 Other intervertebral disc degeneration, thoracic region: Secondary | ICD-10-CM | POA: Diagnosis not present

## 2015-01-19 DIAGNOSIS — M5136 Other intervertebral disc degeneration, lumbar region: Secondary | ICD-10-CM | POA: Diagnosis not present

## 2015-01-26 DIAGNOSIS — G459 Transient cerebral ischemic attack, unspecified: Secondary | ICD-10-CM | POA: Diagnosis not present

## 2015-01-26 DIAGNOSIS — I110 Hypertensive heart disease with heart failure: Secondary | ICD-10-CM | POA: Diagnosis not present

## 2015-01-26 DIAGNOSIS — J449 Chronic obstructive pulmonary disease, unspecified: Secondary | ICD-10-CM | POA: Diagnosis not present

## 2015-01-26 DIAGNOSIS — R0602 Shortness of breath: Secondary | ICD-10-CM | POA: Diagnosis not present

## 2015-01-26 DIAGNOSIS — R0902 Hypoxemia: Secondary | ICD-10-CM | POA: Diagnosis not present

## 2015-01-26 DIAGNOSIS — I482 Chronic atrial fibrillation: Secondary | ICD-10-CM | POA: Diagnosis not present

## 2015-02-04 ENCOUNTER — Other Ambulatory Visit: Payer: Self-pay | Admitting: *Deleted

## 2015-02-04 MED ORDER — DILTIAZEM HCL ER COATED BEADS 180 MG PO CP24: ORAL_CAPSULE | ORAL | Status: DC

## 2015-02-04 NOTE — Telephone Encounter (Signed)
DILTIAZEM CD ER 180 MG #30

## 2015-02-04 NOTE — Telephone Encounter (Signed)
escribed rx as requested

## 2015-02-16 DIAGNOSIS — M5134 Other intervertebral disc degeneration, thoracic region: Secondary | ICD-10-CM | POA: Diagnosis not present

## 2015-02-16 DIAGNOSIS — M9903 Segmental and somatic dysfunction of lumbar region: Secondary | ICD-10-CM | POA: Diagnosis not present

## 2015-02-16 DIAGNOSIS — M9902 Segmental and somatic dysfunction of thoracic region: Secondary | ICD-10-CM | POA: Diagnosis not present

## 2015-02-16 DIAGNOSIS — M5136 Other intervertebral disc degeneration, lumbar region: Secondary | ICD-10-CM | POA: Diagnosis not present

## 2015-02-23 DIAGNOSIS — M545 Low back pain: Secondary | ICD-10-CM | POA: Diagnosis not present

## 2015-02-23 DIAGNOSIS — M4806 Spinal stenosis, lumbar region: Secondary | ICD-10-CM | POA: Diagnosis not present

## 2015-02-23 DIAGNOSIS — M19011 Primary osteoarthritis, right shoulder: Secondary | ICD-10-CM | POA: Diagnosis not present

## 2015-02-23 DIAGNOSIS — M47817 Spondylosis without myelopathy or radiculopathy, lumbosacral region: Secondary | ICD-10-CM | POA: Diagnosis not present

## 2015-02-24 DIAGNOSIS — R0902 Hypoxemia: Secondary | ICD-10-CM | POA: Diagnosis not present

## 2015-02-24 DIAGNOSIS — R0602 Shortness of breath: Secondary | ICD-10-CM | POA: Diagnosis not present

## 2015-03-02 ENCOUNTER — Other Ambulatory Visit (HOSPITAL_COMMUNITY): Payer: Self-pay | Admitting: Orthopaedic Surgery

## 2015-03-02 DIAGNOSIS — M7072 Other bursitis of hip, left hip: Secondary | ICD-10-CM | POA: Diagnosis not present

## 2015-03-02 DIAGNOSIS — M25552 Pain in left hip: Secondary | ICD-10-CM

## 2015-03-11 ENCOUNTER — Ambulatory Visit (HOSPITAL_COMMUNITY)
Admission: RE | Admit: 2015-03-11 | Discharge: 2015-03-11 | Disposition: A | Discharge status: Home / Self Care | Payer: Medicare Other | Source: Ambulatory Visit | Attending: Orthopaedic Surgery | Admitting: Orthopaedic Surgery

## 2015-03-11 DIAGNOSIS — M1612 Unilateral primary osteoarthritis, left hip: Secondary | ICD-10-CM | POA: Diagnosis not present

## 2015-03-11 DIAGNOSIS — M1611 Unilateral primary osteoarthritis, right hip: Secondary | ICD-10-CM | POA: Insufficient documentation

## 2015-03-11 DIAGNOSIS — S76312A Strain of muscle, fascia and tendon of the posterior muscle group at thigh level, left thigh, initial encounter: Secondary | ICD-10-CM | POA: Insufficient documentation

## 2015-03-11 DIAGNOSIS — S76012A Strain of muscle, fascia and tendon of left hip, initial encounter: Secondary | ICD-10-CM | POA: Diagnosis not present

## 2015-03-11 DIAGNOSIS — R296 Repeated falls: Secondary | ICD-10-CM | POA: Diagnosis not present

## 2015-03-11 DIAGNOSIS — W19XXXA Unspecified fall, initial encounter: Secondary | ICD-10-CM | POA: Insufficient documentation

## 2015-03-11 DIAGNOSIS — M25552 Pain in left hip: Secondary | ICD-10-CM | POA: Diagnosis present

## 2015-03-27 DIAGNOSIS — R0602 Shortness of breath: Secondary | ICD-10-CM | POA: Diagnosis not present

## 2015-03-27 DIAGNOSIS — R0902 Hypoxemia: Secondary | ICD-10-CM | POA: Diagnosis not present

## 2015-03-30 DIAGNOSIS — M545 Low back pain: Secondary | ICD-10-CM | POA: Diagnosis not present

## 2015-03-30 DIAGNOSIS — M47817 Spondylosis without myelopathy or radiculopathy, lumbosacral region: Secondary | ICD-10-CM | POA: Diagnosis not present

## 2015-04-25 ENCOUNTER — Other Ambulatory Visit: Payer: Self-pay | Admitting: Cardiology

## 2015-04-26 DIAGNOSIS — R0902 Hypoxemia: Secondary | ICD-10-CM | POA: Diagnosis not present

## 2015-04-26 DIAGNOSIS — R0602 Shortness of breath: Secondary | ICD-10-CM | POA: Diagnosis not present

## 2015-04-28 DIAGNOSIS — M9903 Segmental and somatic dysfunction of lumbar region: Secondary | ICD-10-CM | POA: Diagnosis not present

## 2015-04-28 DIAGNOSIS — M9902 Segmental and somatic dysfunction of thoracic region: Secondary | ICD-10-CM | POA: Diagnosis not present

## 2015-04-28 DIAGNOSIS — M5134 Other intervertebral disc degeneration, thoracic region: Secondary | ICD-10-CM | POA: Diagnosis not present

## 2015-04-28 DIAGNOSIS — M5136 Other intervertebral disc degeneration, lumbar region: Secondary | ICD-10-CM | POA: Diagnosis not present

## 2015-05-04 ENCOUNTER — Ambulatory Visit (INDEPENDENT_AMBULATORY_CARE_PROVIDER_SITE_OTHER): Payer: Medicare Other | Admitting: Adult Health

## 2015-05-04 ENCOUNTER — Encounter: Payer: Self-pay | Admitting: Adult Health

## 2015-05-04 VITALS — BP 140/80 | HR 84 | Ht 62.0 in | Wt 165.0 lb

## 2015-05-04 DIAGNOSIS — B029 Zoster without complications: Secondary | ICD-10-CM | POA: Insufficient documentation

## 2015-05-04 HISTORY — DX: Zoster without complications: B02.9

## 2015-05-04 MED ORDER — VALACYCLOVIR HCL 1 G PO TABS: 1000.0000 mg | ORAL_TABLET | Freq: Three times a day (TID) | ORAL | Status: DC

## 2015-05-04 NOTE — Progress Notes (Signed)
Subjective:     Patient ID: Wendy Perkins, female   DOB: December 27, 1918, 81 y.o.   MRN: [id number]  HPI Wendy Perkins is a 81 year old white female in complaining of rash on low back at butt for about a week, has had some burning, used neosporin.It hurts if sits long time.  Review of Systems  +rash with burning  Reviewed past medical,surgical, social and family history. Reviewed medications and allergies.     Objective:   Physical Exam BP 140/80 mmHg  Pulse 84  Ht 5\' 2"  (1.575 m)  Wt 165 lb (74.844 kg)  BMI 30.17 kg/m2   Has broken vesicles at top of butt crack and some redness, more to the right but `1 on left, Dr Elonda Husky in to co exam and he agrees it looks like shingles  Assessment:     Shingles     Plan:     Rx valtrex 1 gm #30 take 1 tid x 10 days Follow up in 2 weeks Review handout on shingles Keep clean and dry, no neosporin

## 2015-05-04 NOTE — Patient Instructions (Signed)

## 2015-05-10 ENCOUNTER — Other Ambulatory Visit: Payer: Self-pay | Admitting: Cardiology

## 2015-05-18 ENCOUNTER — Encounter: Payer: Self-pay | Admitting: Adult Health

## 2015-05-18 ENCOUNTER — Ambulatory Visit (INDEPENDENT_AMBULATORY_CARE_PROVIDER_SITE_OTHER): Payer: Medicare Other | Admitting: Adult Health

## 2015-05-18 VITALS — BP 140/60 | HR 88 | Ht 62.0 in | Wt 166.0 lb

## 2015-05-18 DIAGNOSIS — B029 Zoster without complications: Secondary | ICD-10-CM

## 2015-05-18 NOTE — Progress Notes (Signed)
Subjective:     Patient ID: Wendy Perkins, female   DOB: 03-13-1919, 81 y.o.   MRN: [id number]  HPI Wendy Perkins is a 81 year old white female back in follow up of having the shingles on her buttocks.She says she is much better.  Review of Systems  Still has pain with sitting but much better, all other systems negative  Reviewed past medical,surgical, social and family history. Reviewed medications and allergies.     Objective:   Physical Exam BP 140/60 mmHg  Pulse 88  Ht 5\' 2"  (1.575 m)  Wt 166 lb (75.297 kg)  BMI 30.35 kg/m2    Skin on buttock is clearing still has 1 scabbed area on right and she says has some pain still with sitting,but much better, told her could have some discomfort for weeks to months.Can take tylenol as needed and she has.  Assessment:     Shingles     Plan:     Follow up prn Use doughnut or egg crate to sit on for comfort

## 2015-05-18 NOTE — Patient Instructions (Signed)
Follow up prn

## 2015-05-27 DIAGNOSIS — R0602 Shortness of breath: Secondary | ICD-10-CM | POA: Diagnosis not present

## 2015-05-27 DIAGNOSIS — R0902 Hypoxemia: Secondary | ICD-10-CM | POA: Diagnosis not present

## 2015-06-07 DIAGNOSIS — Z96651 Presence of right artificial knee joint: Secondary | ICD-10-CM | POA: Diagnosis not present

## 2015-06-07 DIAGNOSIS — M25561 Pain in right knee: Secondary | ICD-10-CM | POA: Diagnosis not present

## 2015-06-07 DIAGNOSIS — T84032A Mechanical loosening of internal right knee prosthetic joint, initial encounter: Secondary | ICD-10-CM | POA: Diagnosis not present

## 2015-06-14 ENCOUNTER — Other Ambulatory Visit: Payer: Self-pay | Admitting: Adult Health

## 2015-06-26 DIAGNOSIS — R0602 Shortness of breath: Secondary | ICD-10-CM | POA: Diagnosis not present

## 2015-06-26 DIAGNOSIS — R0902 Hypoxemia: Secondary | ICD-10-CM | POA: Diagnosis not present

## 2015-07-04 DIAGNOSIS — M5416 Radiculopathy, lumbar region: Secondary | ICD-10-CM | POA: Diagnosis not present

## 2015-07-27 DIAGNOSIS — R0602 Shortness of breath: Secondary | ICD-10-CM | POA: Diagnosis not present

## 2015-07-27 DIAGNOSIS — R0902 Hypoxemia: Secondary | ICD-10-CM | POA: Diagnosis not present

## 2015-08-27 DIAGNOSIS — R0602 Shortness of breath: Secondary | ICD-10-CM | POA: Diagnosis not present

## 2015-08-27 DIAGNOSIS — R0902 Hypoxemia: Secondary | ICD-10-CM | POA: Diagnosis not present

## 2015-08-31 DIAGNOSIS — M9903 Segmental and somatic dysfunction of lumbar region: Secondary | ICD-10-CM | POA: Diagnosis not present

## 2015-08-31 DIAGNOSIS — M9902 Segmental and somatic dysfunction of thoracic region: Secondary | ICD-10-CM | POA: Diagnosis not present

## 2015-08-31 DIAGNOSIS — M5134 Other intervertebral disc degeneration, thoracic region: Secondary | ICD-10-CM | POA: Diagnosis not present

## 2015-08-31 DIAGNOSIS — M5136 Other intervertebral disc degeneration, lumbar region: Secondary | ICD-10-CM | POA: Diagnosis not present

## 2015-09-14 DIAGNOSIS — M9901 Segmental and somatic dysfunction of cervical region: Secondary | ICD-10-CM | POA: Diagnosis not present

## 2015-09-14 DIAGNOSIS — M5033 Other cervical disc degeneration, cervicothoracic region: Secondary | ICD-10-CM | POA: Diagnosis not present

## 2015-09-14 DIAGNOSIS — M5136 Other intervertebral disc degeneration, lumbar region: Secondary | ICD-10-CM | POA: Diagnosis not present

## 2015-09-14 DIAGNOSIS — M9903 Segmental and somatic dysfunction of lumbar region: Secondary | ICD-10-CM | POA: Diagnosis not present

## 2015-09-26 DIAGNOSIS — R0902 Hypoxemia: Secondary | ICD-10-CM | POA: Diagnosis not present

## 2015-09-26 DIAGNOSIS — R0602 Shortness of breath: Secondary | ICD-10-CM | POA: Diagnosis not present

## 2015-09-28 DIAGNOSIS — M25551 Pain in right hip: Secondary | ICD-10-CM | POA: Diagnosis not present

## 2015-10-13 ENCOUNTER — Emergency Department (HOSPITAL_COMMUNITY): Payer: Medicare Other

## 2015-10-13 ENCOUNTER — Encounter (HOSPITAL_COMMUNITY): Payer: Self-pay | Admitting: Emergency Medicine

## 2015-10-13 ENCOUNTER — Inpatient Hospital Stay (HOSPITAL_COMMUNITY)
Admission: EM | Admit: 2015-10-13 | Discharge: 2015-10-17 | DRG: 065 | Disposition: A | Discharge status: Skilled Nursing Facility | Payer: Medicare Other | Attending: Pulmonary Disease | Admitting: Pulmonary Disease

## 2015-10-13 DIAGNOSIS — N390 Urinary tract infection, site not specified: Secondary | ICD-10-CM | POA: Diagnosis not present

## 2015-10-13 DIAGNOSIS — Z8249 Family history of ischemic heart disease and other diseases of the circulatory system: Secondary | ICD-10-CM | POA: Diagnosis not present

## 2015-10-13 DIAGNOSIS — M6281 Muscle weakness (generalized): Secondary | ICD-10-CM | POA: Diagnosis not present

## 2015-10-13 DIAGNOSIS — M549 Dorsalgia, unspecified: Secondary | ICD-10-CM | POA: Diagnosis not present

## 2015-10-13 DIAGNOSIS — J302 Other seasonal allergic rhinitis: Secondary | ICD-10-CM | POA: Diagnosis not present

## 2015-10-13 DIAGNOSIS — Z823 Family history of stroke: Secondary | ICD-10-CM

## 2015-10-13 DIAGNOSIS — R52 Pain, unspecified: Secondary | ICD-10-CM | POA: Diagnosis not present

## 2015-10-13 DIAGNOSIS — R4182 Altered mental status, unspecified: Secondary | ICD-10-CM

## 2015-10-13 DIAGNOSIS — I10 Essential (primary) hypertension: Secondary | ICD-10-CM | POA: Diagnosis present

## 2015-10-13 DIAGNOSIS — E785 Hyperlipidemia, unspecified: Secondary | ICD-10-CM | POA: Diagnosis not present

## 2015-10-13 DIAGNOSIS — Z9181 History of falling: Secondary | ICD-10-CM | POA: Diagnosis not present

## 2015-10-13 DIAGNOSIS — Z8673 Personal history of transient ischemic attack (TIA), and cerebral infarction without residual deficits: Secondary | ICD-10-CM

## 2015-10-13 DIAGNOSIS — R262 Difficulty in walking, not elsewhere classified: Secondary | ICD-10-CM | POA: Diagnosis not present

## 2015-10-13 DIAGNOSIS — F22 Delusional disorders: Secondary | ICD-10-CM

## 2015-10-13 DIAGNOSIS — R778 Other specified abnormalities of plasma proteins: Secondary | ICD-10-CM | POA: Diagnosis present

## 2015-10-13 DIAGNOSIS — I6340 Cerebral infarction due to embolism of unspecified cerebral artery: Principal | ICD-10-CM | POA: Diagnosis present

## 2015-10-13 DIAGNOSIS — R7989 Other specified abnormal findings of blood chemistry: Secondary | ICD-10-CM | POA: Diagnosis present

## 2015-10-13 DIAGNOSIS — I4891 Unspecified atrial fibrillation: Secondary | ICD-10-CM | POA: Diagnosis not present

## 2015-10-13 DIAGNOSIS — R278 Other lack of coordination: Secondary | ICD-10-CM | POA: Diagnosis not present

## 2015-10-13 DIAGNOSIS — W19XXXA Unspecified fall, initial encounter: Secondary | ICD-10-CM | POA: Diagnosis present

## 2015-10-13 DIAGNOSIS — S060X9A Concussion with loss of consciousness of unspecified duration, initial encounter: Secondary | ICD-10-CM | POA: Diagnosis not present

## 2015-10-13 DIAGNOSIS — R220 Localized swelling, mass and lump, head: Secondary | ICD-10-CM | POA: Diagnosis not present

## 2015-10-13 DIAGNOSIS — Z7982 Long term (current) use of aspirin: Secondary | ICD-10-CM | POA: Diagnosis not present

## 2015-10-13 DIAGNOSIS — M19042 Primary osteoarthritis, left hand: Secondary | ICD-10-CM | POA: Diagnosis not present

## 2015-10-13 DIAGNOSIS — J449 Chronic obstructive pulmonary disease, unspecified: Secondary | ICD-10-CM | POA: Diagnosis present

## 2015-10-13 DIAGNOSIS — I6789 Other cerebrovascular disease: Secondary | ICD-10-CM | POA: Diagnosis not present

## 2015-10-13 DIAGNOSIS — I639 Cerebral infarction, unspecified: Secondary | ICD-10-CM

## 2015-10-13 DIAGNOSIS — Z825 Family history of asthma and other chronic lower respiratory diseases: Secondary | ICD-10-CM

## 2015-10-13 DIAGNOSIS — I482 Chronic atrial fibrillation: Secondary | ICD-10-CM | POA: Diagnosis not present

## 2015-10-13 DIAGNOSIS — R1312 Dysphagia, oropharyngeal phase: Secondary | ICD-10-CM | POA: Diagnosis not present

## 2015-10-13 DIAGNOSIS — R749 Abnormal serum enzyme level, unspecified: Secondary | ICD-10-CM | POA: Diagnosis not present

## 2015-10-13 DIAGNOSIS — I5032 Chronic diastolic (congestive) heart failure: Secondary | ICD-10-CM | POA: Diagnosis not present

## 2015-10-13 LAB — COMPREHENSIVE METABOLIC PANEL
ALT: 21 U/L (ref 14–54)
AST: 26 U/L (ref 15–41)
Albumin: 3.9 g/dL (ref 3.5–5.0)
Alkaline Phosphatase: 93 U/L (ref 38–126)
Anion gap: 11 (ref 5–15)
BUN: 19 mg/dL (ref 6–20)
CO2: 20 mmol/L — ABNORMAL LOW (ref 22–32)
CREATININE: 0.76 mg/dL (ref 0.44–1.00)
Calcium: 9.3 mg/dL (ref 8.9–10.3)
Chloride: 107 mmol/L (ref 101–111)
Glucose, Bld: 113 mg/dL — ABNORMAL HIGH (ref 65–99)
POTASSIUM: 4 mmol/L (ref 3.5–5.1)
SODIUM: 138 mmol/L (ref 135–145)
TOTAL PROTEIN: 6.9 g/dL (ref 6.5–8.1)
Total Bilirubin: 0.9 mg/dL (ref 0.3–1.2)

## 2015-10-13 LAB — URINALYSIS, ROUTINE W REFLEX MICROSCOPIC
BILIRUBIN URINE: NEGATIVE
GLUCOSE, UA: NEGATIVE mg/dL
Nitrite: NEGATIVE
PH: 6 (ref 5.0–8.0)
Protein, ur: 100 mg/dL — AB
Specific Gravity, Urine: 1.025 (ref 1.005–1.030)
Urobilinogen, UA: 1 mg/dL (ref 0.0–1.0)

## 2015-10-13 LAB — TROPONIN I
TROPONIN I: 0.15 ng/mL — AB (ref <0.031)
Troponin I: 0.08 ng/mL — ABNORMAL HIGH (ref <0.031)
Troponin I: 0.11 ng/mL — ABNORMAL HIGH (ref <0.031)

## 2015-10-13 LAB — CBC WITH DIFFERENTIAL/PLATELET
BASOS ABS: 0 10*3/uL (ref 0.0–0.1)
Basophils Relative: 0 %
EOS ABS: 0 10*3/uL (ref 0.0–0.7)
EOS PCT: 0 %
HCT: 45.2 % (ref 36.0–46.0)
Hemoglobin: 15.3 g/dL — ABNORMAL HIGH (ref 12.0–15.0)
Lymphocytes Relative: 8 %
Lymphs Abs: 0.8 10*3/uL (ref 0.7–4.0)
MCH: 28.9 pg (ref 26.0–34.0)
MCHC: 33.8 g/dL (ref 30.0–36.0)
MCV: 85.3 fL (ref 78.0–100.0)
MONO ABS: 0.8 10*3/uL (ref 0.1–1.0)
Monocytes Relative: 7 %
Neutro Abs: 8.7 10*3/uL — ABNORMAL HIGH (ref 1.7–7.7)
Neutrophils Relative %: 85 %
PLATELETS: 214 10*3/uL (ref 150–400)
RBC: 5.3 MIL/uL — AB (ref 3.87–5.11)
RDW: 13.6 % (ref 11.5–15.5)
WBC: 10.2 10*3/uL (ref 4.0–10.5)

## 2015-10-13 LAB — CK: Total CK: 84 U/L (ref 38–234)

## 2015-10-13 LAB — URINE MICROSCOPIC-ADD ON

## 2015-10-13 LAB — AMMONIA: AMMONIA: 26 umol/L (ref 9–35)

## 2015-10-13 MED ORDER — ARTIFICIAL TEARS OP OINT
TOPICAL_OINTMENT | Freq: Once | OPHTHALMIC | Status: AC
  Administered 2015-10-13: 23:00:00 via OPHTHALMIC
  Filled 2015-10-13: qty 3.5

## 2015-10-13 MED ORDER — PANTOPRAZOLE SODIUM 40 MG PO TBEC
40.0000 mg | DELAYED_RELEASE_TABLET | Freq: Every day | ORAL | Status: DC
  Administered 2015-10-13 – 2015-10-17 (×5): 40 mg via ORAL
  Filled 2015-10-13 (×5): qty 1

## 2015-10-13 MED ORDER — ONDANSETRON HCL 4 MG/2ML IJ SOLN: 4.0000 mg | Freq: Four times a day (QID) | INTRAMUSCULAR | Status: DC | PRN

## 2015-10-13 MED ORDER — POLYETHYLENE GLYCOL 3350 17 G PO PACK: 17.0000 g | PACK | Freq: Every day | ORAL | Status: DC | PRN

## 2015-10-13 MED ORDER — ADULT MULTIVITAMIN W/MINERALS CH
1.0000 | ORAL_TABLET | Freq: Every day | ORAL | Status: DC
  Administered 2015-10-13 – 2015-10-17 (×5): 1 via ORAL
  Filled 2015-10-13 (×5): qty 1

## 2015-10-13 MED ORDER — TIOTROPIUM BROMIDE MONOHYDRATE 18 MCG IN CAPS
ORAL_CAPSULE | RESPIRATORY_TRACT | Status: AC
  Filled 2015-10-13: qty 5

## 2015-10-13 MED ORDER — POTASSIUM CHLORIDE CRYS ER 20 MEQ PO TBCR
20.0000 meq | EXTENDED_RELEASE_TABLET | Freq: Every day | ORAL | Status: DC
  Administered 2015-10-13 – 2015-10-17 (×5): 20 meq via ORAL
  Filled 2015-10-13 (×5): qty 1

## 2015-10-13 MED ORDER — FLUTICASONE PROPIONATE 50 MCG/ACT NA SUSP
2.0000 | Freq: Every day | NASAL | Status: DC
  Administered 2015-10-14 – 2015-10-17 (×4): 2 via NASAL
  Filled 2015-10-13: qty 16

## 2015-10-13 MED ORDER — DEXTROSE 5 % IV SOLN
INTRAVENOUS | Status: AC
  Filled 2015-10-13: qty 10

## 2015-10-13 MED ORDER — ASPIRIN 81 MG PO CHEW
81.0000 mg | CHEWABLE_TABLET | Freq: Every day | ORAL | Status: DC
  Administered 2015-10-13 – 2015-10-17 (×5): 81 mg via ORAL
  Filled 2015-10-13 (×5): qty 1

## 2015-10-13 MED ORDER — ONDANSETRON HCL 4 MG PO TABS: 4.0000 mg | ORAL_TABLET | Freq: Four times a day (QID) | ORAL | Status: DC | PRN

## 2015-10-13 MED ORDER — FUROSEMIDE 20 MG PO TABS
20.0000 mg | ORAL_TABLET | Freq: Every day | ORAL | Status: DC
  Administered 2015-10-14 – 2015-10-17 (×4): 20 mg via ORAL
  Filled 2015-10-13 (×4): qty 1

## 2015-10-13 MED ORDER — ENOXAPARIN SODIUM 40 MG/0.4ML ~~LOC~~ SOLN
40.0000 mg | SUBCUTANEOUS | Status: DC
  Administered 2015-10-13 – 2015-10-14 (×2): 40 mg via SUBCUTANEOUS
  Filled 2015-10-13 (×2): qty 0.4

## 2015-10-13 MED ORDER — ACETAMINOPHEN 325 MG PO TABS
650.0000 mg | ORAL_TABLET | Freq: Three times a day (TID) | ORAL | Status: DC
  Administered 2015-10-13 – 2015-10-17 (×11): 650 mg via ORAL
  Filled 2015-10-13 (×11): qty 2

## 2015-10-13 MED ORDER — DILTIAZEM HCL ER COATED BEADS 180 MG PO CP24
180.0000 mg | ORAL_CAPSULE | Freq: Every day | ORAL | Status: DC
  Administered 2015-10-14 – 2015-10-17 (×4): 180 mg via ORAL
  Filled 2015-10-13 (×4): qty 1

## 2015-10-13 MED ORDER — UMECLIDINIUM-VILANTEROL 62.5-25 MCG/INH IN AEPB
1.0000 | INHALATION_SPRAY | Freq: Every day | RESPIRATORY_TRACT | Status: DC
  Filled 2015-10-13: qty 60

## 2015-10-13 MED ORDER — ACETAMINOPHEN ER 650 MG PO TBCR: 650.0000 mg | EXTENDED_RELEASE_TABLET | Freq: Three times a day (TID) | ORAL | Status: DC

## 2015-10-13 MED ORDER — DICLOFENAC SODIUM 1 % TD GEL
TRANSDERMAL | Status: AC
  Filled 2015-10-13: qty 100

## 2015-10-13 MED ORDER — ONDANSETRON HCL 4 MG/2ML IJ SOLN: 4.0000 mg | Freq: Three times a day (TID) | INTRAMUSCULAR | Status: DC | PRN

## 2015-10-13 MED ORDER — TIOTROPIUM BROMIDE MONOHYDRATE 18 MCG IN CAPS
18.0000 ug | ORAL_CAPSULE | Freq: Every day | RESPIRATORY_TRACT | Status: DC
  Administered 2015-10-14 – 2015-10-17 (×4): 18 ug via RESPIRATORY_TRACT
  Filled 2015-10-13: qty 5

## 2015-10-13 MED ORDER — DEXTROSE 5 % IV SOLN
1.0000 g | INTRAVENOUS | Status: DC
  Administered 2015-10-13 – 2015-10-16 (×4): 1 g via INTRAVENOUS
  Filled 2015-10-13 (×5): qty 10

## 2015-10-13 MED ORDER — CYCLOSPORINE 0.05 % OP EMUL
OPHTHALMIC | Status: AC
  Filled 2015-10-13: qty 1

## 2015-10-13 MED ORDER — SODIUM CHLORIDE 0.9 % IV SOLN
INTRAVENOUS | Status: DC
  Administered 2015-10-13 – 2015-10-15 (×3): via INTRAVENOUS

## 2015-10-13 MED ORDER — POLYETHYLENE GLYCOL 3350 17 GM/SCOOP PO POWD: 17.0000 g | ORAL | Status: DC | PRN

## 2015-10-13 MED ORDER — CYCLOSPORINE 0.05 % OP EMUL
1.0000 [drp] | Freq: Two times a day (BID) | OPHTHALMIC | Status: DC
  Administered 2015-10-13 – 2015-10-17 (×8): 1 [drp] via OPHTHALMIC
  Filled 2015-10-13 (×12): qty 1

## 2015-10-13 MED ORDER — SODIUM CHLORIDE 0.9 % IJ SOLN
3.0000 mL | Freq: Two times a day (BID) | INTRAMUSCULAR | Status: DC
  Administered 2015-10-13 – 2015-10-17 (×5): 3 mL via INTRAVENOUS

## 2015-10-13 MED ORDER — ALBUTEROL SULFATE (2.5 MG/3ML) 0.083% IN NEBU: 3.0000 mL | INHALATION_SOLUTION | RESPIRATORY_TRACT | Status: DC | PRN

## 2015-10-13 MED ORDER — ARTIFICIAL TEARS OP OINT
TOPICAL_OINTMENT | OPHTHALMIC | Status: AC
  Filled 2015-10-13: qty 3.5

## 2015-10-13 MED ORDER — POLYETHYL GLYCOL-PROPYL GLYCOL 0.4-0.3 % OP GEL: Freq: Once | OPHTHALMIC | Status: DC

## 2015-10-13 MED ORDER — DICLOFENAC SODIUM 1 % TD GEL
2.0000 g | Freq: Two times a day (BID) | TRANSDERMAL | Status: DC
  Administered 2015-10-13 – 2015-10-17 (×8): 2 g via TOPICAL
  Filled 2015-10-13: qty 100

## 2015-10-13 NOTE — H&P (Signed)
[facility] History and Physical  LADA FULBRIGHT NLG:[id number] DOB: July 04, 1919 DOA: 10/13/2015  Referring physician: ER PCP: Alonza Bogus, MD   Chief Complaint: Altered mental status.  HPI: Wendy Perkins is a 81 y.o. female  This is a 81 year old lady, who lives by herself and manages all activities of daily living normally, was found by her son this morning sitting on the floor in the kitchen area. The patient remembers going from her bedroom to the kitchen but then cannot give me any clear history. She thinks she was lightheaded/dizzy. She thinks that she did not lose consciousness. She denies any chest pain, palpitations, dyspnea. According to the sons, who are at the bedside, she has been more confused earlier this morning but appears to be slightly better now. One of the sons describes foul-smelling urine. She is now being admitted for further investigation.   Review of Systems:  Patient unable to give me any clear review of systems secondary to altered mental status..   Past Medical History  Diagnosis Date  . Dyspnea     Previous CPX suggesting possible restrictive physiology, respiratory muscle fatigue, diastolic dysfunction  . Seasonal allergies   . Hyperlipidemia   . Essential hypertension, benign   . Carotid artery disease ([place])   . Dizziness   . Atrial fibrillation ([place])   . Toe fracture, left     left big toe  . Rib pain on left side 11/15/2014  . Breast pain, left 11/15/2014  . Breast nodule 11/15/2014  . Shingles 05/04/2015   Past Surgical History  Procedure Laterality Date  . Colonoscopy  02/22/2012    Procedure: COLONOSCOPY;  Surgeon: Rogene Houston, MD;  Location: [facility];  Service: Endoscopy;  Laterality: N/A;  100   Social History:  reports that she has never smoked. She has never used smokeless tobacco. She reports that she does not drink alcohol or use illicit drugs.  Allergies  Allergen Reactions  . Sulfur Swelling    Rash also per  patient    Family History  Problem Relation Age of Onset  . Stroke Mother   . Pneumonia Father   . Heart disease Father   . Healthy Son   . Glaucoma Daughter   . Emphysema Daughter   . Healthy Daughter   . Other Daughter     had knee replacement  . Healthy Son   . Heart disease Maternal Grandmother     Prior to Admission medications   Medication Sig Start Date End Date Taking? Authorizing Provider  ANORO ELLIPTA 62.5-25 MCG/INH AEPB 1 Inhaler daily.  10/12/14  Yes Historical Provider, MD  cycloSPORINE (RESTASIS) 0.05 % ophthalmic emulsion Place 1 drop into both eyes every 12 (twelve) hours.    Yes Historical Provider, MD  diltiazem (CARDIZEM CD) 180 MG 24 hr capsule TAKE ONE CAPSULE BY MOUTH ONCE DAILY. 05/10/15  Yes Satira Sark, MD  fluticasone (FLONASE) 50 MCG/ACT nasal spray Place 2 sprays into the nose daily as needed for allergies.    Yes Historical Provider, MD  furosemide (LASIX) 20 MG tablet TAKE 1 TABLET BY MOUTH DAILY. 04/25/15  Yes Satira Sark, MD  omeprazole (PRILOSEC) 20 MG capsule Take 20 mg by mouth daily.     Yes Historical Provider, MD  potassium chloride SA (K-DUR,KLOR-CON) 20 MEQ tablet TAKE ONE TABLET BY MOUTH EVERY OTHER DAY. TAKE ONE THE DAY YOU TAKE FUROSEMIDE. 06/14/15  Yes Lendon Colonel, NP  PROAIR HFA 108 (90 BASE) MCG/ACT inhaler  2 puffs 3 (three) times daily as needed for wheezing (cough).  05/13/15  Yes Historical Provider, MD  VOLTAREN 1 % GEL 2 (two) times daily.  02/17/15  Yes Historical Provider, MD  Acetaminophen (TYLENOL ARTHRITIS PAIN PO) Take 1 tablet by mouth 3 (three) times daily.     Historical Provider, MD  aspirin 81 MG tablet Take 81 mg by mouth daily.    Historical Provider, MD  Multiple Vitamin (MULITIVITAMIN WITH MINERALS) TABS Take 1 tablet by mouth. Takes 1 tab 2-3 times a week    Historical Provider, MD  Polyethyl Glycol-Propyl Glycol (SYSTANE OP) Apply 1 drop to eye as directed. For dry eyes    Historical Provider, MD    polyethylene glycol powder (GLYCOLAX/MIRALAX) powder Take 17 g by mouth as needed.  01/16/12   Historical Provider, MD  tiotropium (SPIRIVA) 18 MCG inhalation capsule Place 18 mcg into inhaler and inhale daily.      Historical Provider, MD   Physical Exam: Filed Vitals:   10/13/15 1231 10/13/15 1529 10/13/15 1836  BP: 152/82 142/85 137/66  Pulse: 87 78 71  Temp: 98.5 F (36.9 C) 98.2 F (36.8 C) 97.8 F (36.6 C)  TempSrc: Oral Oral Oral  Resp: 17 14 16   Weight: 77.111 kg (170 lb)  77.22 kg (170 lb 3.8 oz)  SpO2: 98% 95% 97%    Wt Readings from Last 3 Encounters:  10/13/15 77.22 kg (170 lb 3.8 oz)  05/18/15 75.297 kg (166 lb)  05/04/15 74.844 kg (165 lb)    General:  Appears calm and comfortable. She is not toxic/septic. Eyes: PERRL, normal lids, irises & conjunctiva ENT: grossly normal hearing, lips & tongue Neck: no LAD, masses or thyromegaly Cardiovascular: RRR, no m/r/g. No LE edema. Telemetry: SR, no arrhythmias  Respiratory: CTA bilaterally, no w/r/r. Normal respiratory effort. Abdomen: soft, ntnd Skin: no rash or induration seen on limited exam Musculoskeletal: grossly normal tone BUE/BLE Psychiatric: Not examined. Neurologic: grossly non-focal. she does appear somewhat confused. There are no focal neurological signs.           Labs on Admission:  Basic Metabolic Panel:  Recent Labs Lab 10/13/15 1259  NA 138  K 4.0  CL 107  CO2 20*  GLUCOSE 113*  BUN 19  CREATININE 0.76  CALCIUM 9.3   Liver Function Tests:  Recent Labs Lab 10/13/15 1259  AST 26  ALT 21  ALKPHOS 93  BILITOT 0.9  PROT 6.9  ALBUMIN 3.9   No results for input(s): LIPASE, AMYLASE in the last 168 hours.  Recent Labs Lab 10/13/15 1300  AMMONIA 26   CBC:  Recent Labs Lab 10/13/15 1259  WBC 10.2  NEUTROABS 8.7*  HGB 15.3*  HCT 45.2  MCV 85.3  PLT 214   Cardiac Enzymes:  Recent Labs Lab 10/13/15 1259 10/13/15 1416  CKTOTAL 84  --   TROPONINI 0.08* 0.11*    BNP  (last 3 results) No results for input(s): BNP in the last 8760 hours.  ProBNP (last 3 results) No results for input(s): PROBNP in the last 8760 hours.  CBG: No results for input(s): GLUCAP in the last 168 hours.  Radiological Exams on Admission: Dg Thoracic Spine 2 View  10/13/2015  CLINICAL DATA:  Initial encounter for Pt fell today/pain back EXAM: THORACIC SPINE 2 VIEWS COMPARISON:  Chest radiograph of 08/25/2013 FINDINGS: S-shaped thoracic spine curvature. Cardiomegaly. Atherosclerosis in the transverse aorta. Swimmer's view suboptimal with the cervical thoracic junction and upper thoracic spine not well evaluated. The  first lateral view images approximately from the top of T2 through the bottom of T12. Spondylosis, with maintenance of vertebral body height across these levels. IMPRESSION: Suboptimal evaluation of the cervicothoracic junction and upper most thoracic spine. Given this factor, spondylosis but no convincing evidence of acute superimposed process. Electronically Signed   By: Abigail Miyamoto M.D.   On: 10/13/2015 15:11   Dg Lumbar Spine Complete  10/13/2015  CLINICAL DATA:  Initial encounter for Pt fell today/pain back EXAM: LUMBAR SPINE - COMPLETE 4+ VIEW COMPARISON:  Lumbar spine MRI of 04/25/2012 FINDINGS: Diminutive right twelfth rib. Given this nomenclature, 5 lumbar type vertebral bodies with mild to moderate convex left curvature. Aortic and branch vessel atherosclerosis. Osteopenia. Lateral view mildly degraded by the extent of spinal curvature and osteopenia. Given this factor, vertebral body height alignment are maintained. Advanced facet arthropathy throughout the lower lumbar spine. IMPRESSION: Advanced spondylosis, without acute osseous finding. Electronically Signed   By: Abigail Miyamoto M.D.   On: 10/13/2015 15:08   Ct Head Wo Contrast  10/13/2015  CLINICAL DATA:  Fall, altered mental status EXAM: CT HEAD WITHOUT CONTRAST TECHNIQUE: Contiguous axial images were obtained from  the base of the skull through the vertex without intravenous contrast. COMPARISON:  05/27/2012 FINDINGS: No skull fracture is noted. There is scalp swelling and mild subcutaneous stranding midline posterior parietal scalp axial image 27. No intracranial hemorrhage, mass effect or midline shift. Mild cerebral atrophy. Mild periventricular white matter decreased attenuation probable due to chronic small vessel ischemic changes. No acute cortical infarction. No mass lesion is noted on this unenhanced scan. IMPRESSION: No acute intracranial abnormality. Stable cerebral atrophy. Mild scalp swelling and subcutaneous stranding midline posterior parietal region please see axial image 27. Electronically Signed   By: Lahoma Crocker M.D.   On: 10/13/2015 13:34    EKG: Independently reviewed. Atrial fibrillation. Ventricular rate is controlled. There are no ST changes acutely.  Assessment/Plan   1. Altered mental status. This may be multifactorial. MRI brain has been ordered to see if she has had a stroke. I will last physical therapy to evaluate her mobility. 2. UTI. This is contributing to #1. Treat with intravenous Rocephin. 3. Atrial fibrillation. This is chronic. She is not on any anticoagulation therapy. 4. Hypertension. Stable. 5. Elevated troponin. I will cycle further enzymes. I'm not sure she would be a candidate for aggressive cardiological intervention.  She will be admitted to telemetry. Further recommendations will depend on patient's hospital progress.   Code Status: Full code for time being. I did broach the subject of CODE STATUS with the sons present at the bedside. I'm not sure they are in agreement.  DVT Prophylaxis: Lovenox.  Family Communication: I discussed the plan with both the sons at the bedside.   Disposition Plan: Depending on progress.   Time spent: 60 minutes.  Doree Albee [facility] Pager [phone].

## 2015-10-13 NOTE — Progress Notes (Signed)
Wendy Perkins arrived to unit from ED.  Paged Dr. Gearlean Alf

## 2015-10-13 NOTE — ED Provider Notes (Signed)
CSN: [id number]     Arrival date & time 10/13/15  1229 History   First MD Initiated Contact with Patient 10/13/15 1234     Chief Complaint  Patient presents with  . Fall     (Consider location/radiation/quality/duration/timing/severity/associated sxs/prior Treatment) HPI Comments: The patient is a 81 year old female, she lives by herself according to the paramedics, she was found by her self by her son sitting on the floor of a room in the house leaning against a wall. The patient does not remember when she fell but states it was this morning. She denies complaints other than having some headache. She is unable to give me a very clear history at all of what happened, she seems confused there is no other historians available on the patient's arrival.  Patient is a 81 y.o. female presenting with fall. The history is provided by the patient.  Fall    Past Medical History  Diagnosis Date  . Dyspnea     Previous CPX suggesting possible restrictive physiology, respiratory muscle fatigue, diastolic dysfunction  . Seasonal allergies   . Hyperlipidemia   . Essential hypertension, benign   . Carotid artery disease ([place])   . Dizziness   . Atrial fibrillation ([place])   . Toe fracture, left     left big toe  . Rib pain on left side 11/15/2014  . Breast pain, left 11/15/2014  . Breast nodule 11/15/2014  . Shingles 05/04/2015   Past Surgical History  Procedure Laterality Date  . Colonoscopy  02/22/2012    Procedure: COLONOSCOPY;  Surgeon: Rogene Houston, MD;  Location: [facility];  Service: Endoscopy;  Laterality: N/A;  100   Family History  Problem Relation Age of Onset  . Stroke Mother   . Pneumonia Father   . Heart disease Father   . Healthy Son   . Glaucoma Daughter   . Emphysema Daughter   . Healthy Daughter   . Other Daughter     had knee replacement  . Healthy Son   . Heart disease Maternal Grandmother    Social History  Substance Use Topics  . Smoking status: Never Smoker    . Smokeless tobacco: Never Used  . Alcohol Use: No   OB History    Gravida Para Term Preterm AB TAB SAB Ectopic Multiple Living   4 4        4      Review of Systems  Unable to perform ROS: Mental status change      Allergies  Sulfur  Home Medications   Prior to Admission medications   Medication Sig Start Date End Date Taking? Authorizing Provider  Acetaminophen (TYLENOL ARTHRITIS PAIN PO) Take by mouth. Takes 1 tab TID    Historical Provider, MD  ANORO ELLIPTA 62.5-25 MCG/INH AEPB 1 Inhaler daily.  10/12/14   Historical Provider, MD  aspirin 81 MG tablet Take 81 mg by mouth daily.    Historical Provider, MD  cycloSPORINE (RESTASIS) 0.05 % ophthalmic emulsion Place 1 drop into both eyes every 12 (twelve) hours.     Historical Provider, MD  diltiazem (CARDIZEM CD) 180 MG 24 hr capsule TAKE ONE CAPSULE BY MOUTH ONCE DAILY. 05/10/15   Satira Sark, MD  fluticasone (FLONASE) 50 MCG/ACT nasal spray Place 2 sprays into the nose daily as needed.     Historical Provider, MD  furosemide (LASIX) 20 MG tablet TAKE 1 TABLET BY MOUTH DAILY. Patient taking differently: 1 tab every other day 04/25/15   Aloha Gell  Domenic Polite, MD  Multiple Vitamin (MULITIVITAMIN WITH MINERALS) TABS Take 1 tablet by mouth. Takes 1 tab 2-3 times a week    Historical Provider, MD  omeprazole (PRILOSEC) 20 MG capsule Take 20 mg by mouth daily.      Historical Provider, MD  Polyethyl Glycol-Propyl Glycol (SYSTANE OP) Apply 1 drop to eye as directed. For dry eyes    Historical Provider, MD  polyethylene glycol powder (GLYCOLAX/MIRALAX) powder Take 17 g by mouth as needed.  01/16/12   Historical Provider, MD  potassium chloride SA (K-DUR,KLOR-CON) 20 MEQ tablet TAKE ONE TABLET BY MOUTH EVERY OTHER DAY. TAKE ONE THE DAY YOU TAKE FUROSEMIDE. 06/14/15   Lendon Colonel, NP  PROAIR HFA 108 (90 BASE) MCG/ACT inhaler 2 puffs 3 (three) times daily as needed.  05/13/15   Historical Provider, MD  tiotropium (SPIRIVA) 18 MCG  inhalation capsule Place 18 mcg into inhaler and inhale daily.      Historical Provider, MD  VOLTAREN 1 % GEL 2 (two) times daily.  02/17/15   Historical Provider, MD   BP 142/85 mmHg  Pulse 78  Temp(Src) 98.2 F (36.8 C) (Oral)  Resp 14  Wt 170 lb (77.111 kg)  SpO2 95% Physical Exam  Constitutional: She appears well-developed and well-nourished. No distress.  HENT:  Head: Normocephalic.  Mouth/Throat: Oropharynx is clear and moist. No oropharyngeal exudate.  , No visible laceration, bruising present, no bleeding  Eyes: Conjunctivae and EOM are normal. Pupils are equal, round, and reactive to light. Right eye exhibits no discharge. Left eye exhibits no discharge. No scleral icterus.  Neck: Normal range of motion. Neck supple. No JVD present. No thyromegaly present.  Cardiovascular: Normal rate, normal heart sounds and intact distal pulses.  Exam reveals no gallop and no friction rub.   No murmur heard. Irregularly irregular rhythm  Pulmonary/Chest: Effort normal and breath sounds normal. No respiratory distress. She has no wheezes. She has no rales.  Abdominal: Soft. Bowel sounds are normal. She exhibits no distension and no mass. There is no tenderness.  Musculoskeletal: Normal range of motion. She exhibits no edema or tenderness.  Lymphadenopathy:    She has no cervical adenopathy.  Neurological: She is alert. Coordination normal.  Skin: Skin is warm and dry. No rash noted. No erythema.  Psychiatric: She has a normal mood and affect. Her behavior is normal.  Nursing note and vitals reviewed.   ED Course  Procedures (including critical care time) Labs Review Labs Reviewed  CBC WITH DIFFERENTIAL/PLATELET - Abnormal; Notable for the following:    RBC 5.30 (*)    Hemoglobin 15.3 (*)    Neutro Abs 8.7 (*)    All other components within normal limits  COMPREHENSIVE METABOLIC PANEL - Abnormal; Notable for the following:    CO2 20 (*)    Glucose, Bld 113 (*)    All other  components within normal limits  URINALYSIS, ROUTINE W REFLEX MICROSCOPIC (NOT AT [facility]) - Abnormal; Notable for the following:    APPearance HAZY (*)    Hgb urine dipstick TRACE (*)    Ketones, ur TRACE (*)    Protein, ur 100 (*)    Leukocytes, UA SMALL (*)    All other components within normal limits  TROPONIN I - Abnormal; Notable for the following:    Troponin I 0.08 (*)    All other components within normal limits  TROPONIN I - Abnormal; Notable for the following:    Troponin I 0.11 (*)    All  other components within normal limits  URINE MICROSCOPIC-ADD ON - Abnormal; Notable for the following:    Squamous Epithelial / LPF FEW (*)    Bacteria, UA MANY (*)    All other components within normal limits  AMMONIA  CK    Imaging Review Dg Thoracic Spine 2 View  10/13/2015  CLINICAL DATA:  Initial encounter for Pt fell today/pain back EXAM: THORACIC SPINE 2 VIEWS COMPARISON:  Chest radiograph of 08/25/2013 FINDINGS: S-shaped thoracic spine curvature. Cardiomegaly. Atherosclerosis in the transverse aorta. Swimmer's view suboptimal with the cervical thoracic junction and upper thoracic spine not well evaluated. The first lateral view images approximately from the top of T2 through the bottom of T12. Spondylosis, with maintenance of vertebral body height across these levels. IMPRESSION: Suboptimal evaluation of the cervicothoracic junction and upper most thoracic spine. Given this factor, spondylosis but no convincing evidence of acute superimposed process. Electronically Signed   By: Abigail Miyamoto M.D.   On: 10/13/2015 15:11   Dg Lumbar Spine Complete  10/13/2015  CLINICAL DATA:  Initial encounter for Pt fell today/pain back EXAM: LUMBAR SPINE - COMPLETE 4+ VIEW COMPARISON:  Lumbar spine MRI of 04/25/2012 FINDINGS: Diminutive right twelfth rib. Given this nomenclature, 5 lumbar type vertebral bodies with mild to moderate convex left curvature. Aortic and branch vessel atherosclerosis.  Osteopenia. Lateral view mildly degraded by the extent of spinal curvature and osteopenia. Given this factor, vertebral body height alignment are maintained. Advanced facet arthropathy throughout the lower lumbar spine. IMPRESSION: Advanced spondylosis, without acute osseous finding. Electronically Signed   By: Abigail Miyamoto M.D.   On: 10/13/2015 15:08   Ct Head Wo Contrast  10/13/2015  CLINICAL DATA:  Fall, altered mental status EXAM: CT HEAD WITHOUT CONTRAST TECHNIQUE: Contiguous axial images were obtained from the base of the skull through the vertex without intravenous contrast. COMPARISON:  05/27/2012 FINDINGS: No skull fracture is noted. There is scalp swelling and mild subcutaneous stranding midline posterior parietal scalp axial image 27. No intracranial hemorrhage, mass effect or midline shift. Mild cerebral atrophy. Mild periventricular white matter decreased attenuation probable due to chronic small vessel ischemic changes. No acute cortical infarction. No mass lesion is noted on this unenhanced scan. IMPRESSION: No acute intracranial abnormality. Stable cerebral atrophy. Mild scalp swelling and subcutaneous stranding midline posterior parietal region please see axial image 27. Electronically Signed   By: Lahoma Crocker M.D.   On: 10/13/2015 13:34   I have personally reviewed and evaluated these images and lab results as part of my medical decision-making.   MDM   Final diagnoses:  Altered mental status, unspecified altered mental status type  Concussion, with loss of consciousness of unspecified duration, initial encounter  Elevated troponin    The patient is able to move all 4 extremities, she is able to follow commands, her speech seems clear but her answers don't make any sense, vital signs unremarkable, awaiting arrival of family to help clarify the situation and her baseline. Check CT scan of the head and labs to evaluate the source of altered mental status  ED ECG REPORT  I personally  interpreted this EKG   Date: 10/13/2015   Rate: 85  Rhythm: atrial fibrillation  QRS Axis: left  Intervals: normal  ST/T Wave abnormalities: normal  Conduction Disutrbances:none  Narrative Interpretation:   Old EKG Reviewed: none available  CT scan without intracranial hemorrhage, continued to having mild altered mental status changes with inability to answer easy questions. Family member states she is not at  her baseline, I do not have an exact answer for the cause of the symptoms, she does have an elevated troponin which is minimally elevated but continues to stay elevated on repeat testing, discussed with the hospitalist who will admit.   Noemi Chapel, MD 10/13/15 1725

## 2015-10-13 NOTE — ED Notes (Signed)
Patient arrives via EMS from home with c/o fall. Unknown if trip or LOC. Found by Son in floor leaning against wall joint. Patient arrives alert. Patient with laceration to back of head, bleeding controlled.

## 2015-10-14 ENCOUNTER — Inpatient Hospital Stay (HOSPITAL_COMMUNITY): Payer: Medicare Other

## 2015-10-14 LAB — COMPREHENSIVE METABOLIC PANEL
ALBUMIN: 3.4 g/dL — AB (ref 3.5–5.0)
ALK PHOS: 84 U/L (ref 38–126)
ALT: 19 U/L (ref 14–54)
ANION GAP: 8 (ref 5–15)
AST: 27 U/L (ref 15–41)
BUN: 16 mg/dL (ref 6–20)
CALCIUM: 8.6 mg/dL — AB (ref 8.9–10.3)
CHLORIDE: 109 mmol/L (ref 101–111)
CO2: 24 mmol/L (ref 22–32)
Creatinine, Ser: 0.73 mg/dL (ref 0.44–1.00)
GFR calc Af Amer: >60 mL/min (ref >60)
GFR calc non Af Amer: >60 mL/min (ref >60)
GLUCOSE: 93 mg/dL (ref 65–99)
Potassium: 4.1 mmol/L (ref 3.5–5.1)
SODIUM: 141 mmol/L (ref 135–145)
Total Bilirubin: 0.9 mg/dL (ref 0.3–1.2)
Total Protein: 6.1 g/dL — ABNORMAL LOW (ref 6.5–8.1)

## 2015-10-14 LAB — CBC
HCT: 43.2 % (ref 36.0–46.0)
HEMOGLOBIN: 14.2 g/dL (ref 12.0–15.0)
MCH: 28.3 pg (ref 26.0–34.0)
MCHC: 32.9 g/dL (ref 30.0–36.0)
MCV: 86.2 fL (ref 78.0–100.0)
PLATELETS: 207 10*3/uL (ref 150–400)
RBC: 5.01 MIL/uL (ref 3.87–5.11)
RDW: 13.7 % (ref 11.5–15.5)
WBC: 8.1 10*3/uL (ref 4.0–10.5)

## 2015-10-14 LAB — TROPONIN I
TROPONIN I: 0.07 ng/mL — AB (ref <0.031)
Troponin I: 0.12 ng/mL — ABNORMAL HIGH (ref <0.031)

## 2015-10-14 MED ORDER — CLOPIDOGREL BISULFATE 75 MG PO TABS
75.0000 mg | ORAL_TABLET | Freq: Every day | ORAL | Status: DC
  Administered 2015-10-14 – 2015-10-16 (×3): 75 mg via ORAL
  Filled 2015-10-14 (×4): qty 1

## 2015-10-14 NOTE — Evaluation (Signed)
Clinical/Bedside Swallow Evaluation Patient Details  Name: Wendy Perkins MRN: [id number] Date of Birth: 1919-03-15  Today's Date: 10/14/2015 Time: SLP Start Time (ACUTE ONLY): 8563 SLP Stop Time (ACUTE ONLY): 1736 SLP Time Calculation (min) (ACUTE ONLY): 16 min  Past Medical History:  Past Medical History  Diagnosis Date  . Dyspnea     Previous CPX suggesting possible restrictive physiology, respiratory muscle fatigue, diastolic dysfunction  . Seasonal allergies   . Hyperlipidemia   . Essential hypertension, benign   . Carotid artery disease ([place])   . Dizziness   . Atrial fibrillation ([place])   . Toe fracture, left     left big toe  . Rib pain on left side 11/15/2014  . Breast pain, left 11/15/2014  . Breast nodule 11/15/2014  . Shingles 05/04/2015   Past Surgical History:  Past Surgical History  Procedure Laterality Date  . Colonoscopy  02/22/2012    Procedure: COLONOSCOPY;  Surgeon: Rogene Houston, MD;  Location: [facility];  Service: Endoscopy;  Laterality: N/A;  100   HPI:  Pt is a 81 year old lady, who lives by herself and manages all activities of daily living normally, was found by her son this morning sitting on the floor in the kitchen area. The patient remembers going from her bedroom to the kitchen but then cannot give me any clear history. She thinks she was lightheaded/dizzy. 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 ST to evaluate current swallow function.    Assessment / Plan / Recommendation Clinical Impression  Pts swallow appearing functional without overt signs or symptoms of reduced airway protection. Oral motor exam unremarkable. Pt with throat clearing following thin liquid via straw and puree consistencies, however vocal quality remained clear. Recommend continue regular thin  liquid diet. Family and nursing deny difficulty with current diet/swallow function. Pt however does display cognitive linguistic deficits following left hemispheric stroke. Recommend supervision with meals. No swallow intervention indicated however recommend speech language evaluation for expressive deficits observed during BSE.      Aspiration Risk  Mild    Diet Recommendation Age appropriate regular solids;Thin   Medication Administration: Whole meds with liquid Compensations: Minimize environmental distractions;Slow rate;Follow solids with liquid    Other  Recommendations Oral Care Recommendations: Oral care BID   Follow Up Recommendations       Frequency and Duration        Pertinent Vitals/Pain     SLP Swallow Goals     Swallow Study Prior Functional Status       General Date of Onset: 10/13/15 Other Pertinent Information: Pt is a 81 year old lady, who lives by herself and manages all activities of daily living normally, was found by her son this morning sitting on the floor in the kitchen area. The patient remembers going from her bedroom to the kitchen but then cannot give me any clear history. She thinks she was lightheaded/dizzy. 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 ST to evaluate current swallow function.  Type of Study: Bedside swallow evaluation Diet Prior to this Study: Regular;Thin liquids Temperature Spikes Noted: Yes Respiratory Status: Room air History of Recent Intubation: No Behavior/Cognition: Alert Oral Cavity - Dentition: Adequate natural dentition/normal for age Self-Feeding Abilities: Able to feed  self;Needs set up Patient Positioning: Upright in bed Baseline Vocal Quality: Low vocal intensity Volitional Cough: Strong Volitional Swallow: Able to elicit     Oral/Motor/Sensory Function Overall Oral Motor/Sensory Function: Appears within functional limits for tasks assessed   Ice Chips Ice chips: Within functional limits   Thin Liquid Thin Liquid: Impaired Presentation: Cup;Straw Oral Phase Functional Implications: Prolonged oral transit Pharyngeal  Phase Impairments: Throat Clearing - Delayed    Nectar Thick Nectar Thick Liquid: Not tested   Honey Thick Honey Thick Liquid: Not tested   Puree Puree: Impaired Pharyngeal Phase Impairments: Throat Clearing - Delayed   Solid   GO    Solid: Within functional limits      Arvil Chaco MA, CCC-SLP Acute Care Speech Language Pathologist    Levi Aland 10/14/2015,6:04 PM

## 2015-10-14 NOTE — Progress Notes (Signed)
Subjective: She was admitted with a fall and altered mental status. She has been confused. Her family says they've been trying to get her some help at home.  Objective: Vital signs in last 24 hours: Temp:  [97.8 F (36.6 C)-98.5 F (36.9 C)] 98.1 F (36.7 C) (11/04 0559) Pulse Rate:  [71-87] 71 (11/04 0559) Resp:  [14-17] 16 (11/04 0559) BP: (137-163)/(66-85) 151/82 mmHg (11/04 0559) SpO2:  [93 %-98 %] 93 % (11/04 0559) Weight:  [77.111 kg (170 lb)-77.22 kg (170 lb 3.8 oz)] 77.111 kg (170 lb) (11/04 0756) Weight change:  Last BM Date: 10/12/15  Intake/Output from previous day: 11/03 0701 - 11/04 0700 In: -  Out: 750 [Urine:750]  PHYSICAL EXAM General appearance: alert and mild distress Resp: clear to auscultation bilaterally Cardio: irregularly irregular rhythm GI: soft, non-tender; bowel sounds normal; no masses,  no organomegaly Extremities: extremities normal, atraumatic, no cyanosis or edema  Lab Results:  Results for orders placed or performed during the hospital encounter of 10/13/15 (from the past 48 hour(s))  CBC with Differential/Platelet     Status: Abnormal   Collection Time: 10/13/15 12:59 PM  Result Value Ref Range   WBC 10.2 4.0 - 10.5 K/uL   RBC 5.30 (H) 3.87 - 5.11 MIL/uL   Hemoglobin 15.3 (H) 12.0 - 15.0 g/dL   HCT 45.2 36.0 - 46.0 %   MCV 85.3 78.0 - 100.0 fL   MCH 28.9 26.0 - 34.0 pg   MCHC 33.8 30.0 - 36.0 g/dL   RDW 13.6 11.5 - 15.5 %   Platelets 214 150 - 400 K/uL   Neutrophils Relative % 85 %   Neutro Abs 8.7 (H) 1.7 - 7.7 K/uL   Lymphocytes Relative 8 %   Lymphs Abs 0.8 0.7 - 4.0 K/uL   Monocytes Relative 7 %   Monocytes Absolute 0.8 0.1 - 1.0 K/uL   Eosinophils Relative 0 %   Eosinophils Absolute 0.0 0.0 - 0.7 K/uL   Basophils Relative 0 %   Basophils Absolute 0.0 0.0 - 0.1 K/uL  Comprehensive metabolic panel     Status: Abnormal   Collection Time: 10/13/15 12:59 PM  Result Value Ref Range   Sodium 138 135 - 145 mmol/L   Potassium 4.0  3.5 - 5.1 mmol/L   Chloride 107 101 - 111 mmol/L   CO2 20 (L) 22 - 32 mmol/L   Glucose, Bld 113 (H) 65 - 99 mg/dL   BUN 19 6 - 20 mg/dL   Creatinine, Ser 0.76 0.44 - 1.00 mg/dL   Calcium 9.3 8.9 - 10.3 mg/dL   Total Protein 6.9 6.5 - 8.1 g/dL   Albumin 3.9 3.5 - 5.0 g/dL   AST 26 15 - 41 U/L   ALT 21 14 - 54 U/L   Alkaline Phosphatase 93 38 - 126 U/L   Total Bilirubin 0.9 0.3 - 1.2 mg/dL   GFR calc non Af Amer >60 >60 mL/min   GFR calc Af Amer >60 >60 mL/min    Comment: (NOTE) The eGFR has been calculated using the CKD EPI equation. This calculation has not been validated in all clinical situations. eGFR's persistently <60 mL/min signify possible Chronic Kidney Disease.    Anion gap 11 5 - 15  CK     Status: None   Collection Time: 10/13/15 12:59 PM  Result Value Ref Range   Total CK 84 38 - 234 U/L  Troponin I     Status: Abnormal   Collection Time: 10/13/15 12:59 PM  Result Value Ref Range   Troponin I 0.08 (H) <0.031 ng/mL    Comment:        PERSISTENTLY INCREASED TROPONIN VALUES IN THE RANGE OF 0.04-0.49 ng/mL CAN BE SEEN IN:       -UNSTABLE ANGINA       -CONGESTIVE HEART FAILURE       -MYOCARDITIS       -CHEST TRAUMA       -ARRYHTHMIAS       -LATE PRESENTING MYOCARDIAL INFARCTION       -COPD   CLINICAL FOLLOW-UP RECOMMENDED.   Ammonia     Status: None   Collection Time: 10/13/15  1:00 PM  Result Value Ref Range   Ammonia 26 9 - 35 umol/L  Urinalysis, Routine w reflex microscopic (not at [facility])     Status: Abnormal   Collection Time: 10/13/15  1:51 PM  Result Value Ref Range   Color, Urine YELLOW YELLOW   APPearance HAZY (A) CLEAR   Specific Gravity, Urine 1.025 1.005 - 1.030   pH 6.0 5.0 - 8.0   Glucose, UA NEGATIVE NEGATIVE mg/dL   Hgb urine dipstick TRACE (A) NEGATIVE   Bilirubin Urine NEGATIVE NEGATIVE   Ketones, ur TRACE (A) NEGATIVE mg/dL   Protein, ur 100 (A) NEGATIVE mg/dL   Urobilinogen, UA 1.0 0.0 - 1.0 mg/dL   Nitrite NEGATIVE NEGATIVE    Leukocytes, UA SMALL (A) NEGATIVE  Urine microscopic-add on     Status: Abnormal   Collection Time: 10/13/15  1:51 PM  Result Value Ref Range   Squamous Epithelial / LPF FEW (A) RARE   WBC, UA TOO NUMEROUS TO COUNT <3 WBC/hpf   RBC / HPF 3-6 <3 RBC/hpf   Bacteria, UA MANY (A) RARE  Troponin I     Status: Abnormal   Collection Time: 10/13/15  2:16 PM  Result Value Ref Range   Troponin I 0.11 (H) <0.031 ng/mL    Comment:        PERSISTENTLY INCREASED TROPONIN VALUES IN THE RANGE OF 0.04-0.49 ng/mL CAN BE SEEN IN:       -UNSTABLE ANGINA       -CONGESTIVE HEART FAILURE       -MYOCARDITIS       -CHEST TRAUMA       -ARRYHTHMIAS       -LATE PRESENTING MYOCARDIAL INFARCTION       -COPD   CLINICAL FOLLOW-UP RECOMMENDED.   Troponin I     Status: Abnormal   Collection Time: 10/13/15  8:27 PM  Result Value Ref Range   Troponin I 0.15 (H) <0.031 ng/mL    Comment:        PERSISTENTLY INCREASED TROPONIN VALUES IN THE RANGE OF 0.04-0.49 ng/mL CAN BE SEEN IN:       -UNSTABLE ANGINA       -CONGESTIVE HEART FAILURE       -MYOCARDITIS       -CHEST TRAUMA       -ARRYHTHMIAS       -LATE PRESENTING MYOCARDIAL INFARCTION       -COPD   CLINICAL FOLLOW-UP RECOMMENDED.   Comprehensive metabolic panel     Status: Abnormal   Collection Time: 10/14/15  2:25 AM  Result Value Ref Range   Sodium 141 135 - 145 mmol/L   Potassium 4.1 3.5 - 5.1 mmol/L   Chloride 109 101 - 111 mmol/L   CO2 24 22 - 32 mmol/L   Glucose, Bld 93 65 - 99 mg/dL  BUN 16 6 - 20 mg/dL   Creatinine, Ser 0.73 0.44 - 1.00 mg/dL   Calcium 8.6 (L) 8.9 - 10.3 mg/dL   Total Protein 6.1 (L) 6.5 - 8.1 g/dL   Albumin 3.4 (L) 3.5 - 5.0 g/dL   AST 27 15 - 41 U/L   ALT 19 14 - 54 U/L   Alkaline Phosphatase 84 38 - 126 U/L   Total Bilirubin 0.9 0.3 - 1.2 mg/dL   GFR calc non Af Amer >60 >60 mL/min   GFR calc Af Amer >60 >60 mL/min    Comment: (NOTE) The eGFR has been calculated using the CKD EPI equation. This calculation has  not been validated in all clinical situations. eGFR's persistently <60 mL/min signify possible Chronic Kidney Disease.    Anion gap 8 5 - 15  CBC     Status: None   Collection Time: 10/14/15  2:25 AM  Result Value Ref Range   WBC 8.1 4.0 - 10.5 K/uL   RBC 5.01 3.87 - 5.11 MIL/uL   Hemoglobin 14.2 12.0 - 15.0 g/dL   HCT 43.2 36.0 - 46.0 %   MCV 86.2 78.0 - 100.0 fL   MCH 28.3 26.0 - 34.0 pg   MCHC 32.9 30.0 - 36.0 g/dL   RDW 13.7 11.5 - 15.5 %   Platelets 207 150 - 400 K/uL  Troponin I     Status: Abnormal   Collection Time: 10/14/15  2:25 AM  Result Value Ref Range   Troponin I 0.12 (H) <0.031 ng/mL    Comment:        PERSISTENTLY INCREASED TROPONIN VALUES IN THE RANGE OF 0.04-0.49 ng/mL CAN BE SEEN IN:       -UNSTABLE ANGINA       -CONGESTIVE HEART FAILURE       -MYOCARDITIS       -CHEST TRAUMA       -ARRYHTHMIAS       -LATE PRESENTING MYOCARDIAL INFARCTION       -COPD   CLINICAL FOLLOW-UP RECOMMENDED.   Troponin I     Status: Abnormal   Collection Time: 10/14/15  8:21 AM  Result Value Ref Range   Troponin I 0.07 (H) <0.031 ng/mL    Comment:        PERSISTENTLY INCREASED TROPONIN VALUES IN THE RANGE OF 0.04-0.49 ng/mL CAN BE SEEN IN:       -UNSTABLE ANGINA       -CONGESTIVE HEART FAILURE       -MYOCARDITIS       -CHEST TRAUMA       -ARRYHTHMIAS       -LATE PRESENTING MYOCARDIAL INFARCTION       -COPD   CLINICAL FOLLOW-UP RECOMMENDED.     ABGS No results for input(s): PHART, PO2ART, TCO2, HCO3 in the last 72 hours.  Invalid input(s): PCO2 CULTURES No results found for this or any previous visit (from the past 240 hour(s)). Studies/Results: Dg Thoracic Spine 2 View  10/13/2015  CLINICAL DATA:  Initial encounter for Pt fell today/pain back EXAM: THORACIC SPINE 2 VIEWS COMPARISON:  Chest radiograph of 08/25/2013 FINDINGS: S-shaped thoracic spine curvature. Cardiomegaly. Atherosclerosis in the transverse aorta. Swimmer's view suboptimal with the cervical  thoracic junction and upper thoracic spine not well evaluated. The first lateral view images approximately from the top of T2 through the bottom of T12. Spondylosis, with maintenance of vertebral body height across these levels. IMPRESSION: Suboptimal evaluation of the cervicothoracic junction and upper most thoracic spine. Given this  factor, spondylosis but no convincing evidence of acute superimposed process. Electronically Signed   By: Abigail Miyamoto M.D.   On: 10/13/2015 15:11   Dg Lumbar Spine Complete  10/13/2015  CLINICAL DATA:  Initial encounter for Pt fell today/pain back EXAM: LUMBAR SPINE - COMPLETE 4+ VIEW COMPARISON:  Lumbar spine MRI of 04/25/2012 FINDINGS: Diminutive right twelfth rib. Given this nomenclature, 5 lumbar type vertebral bodies with mild to moderate convex left curvature. Aortic and branch vessel atherosclerosis. Osteopenia. Lateral view mildly degraded by the extent of spinal curvature and osteopenia. Given this factor, vertebral body height alignment are maintained. Advanced facet arthropathy throughout the lower lumbar spine. IMPRESSION: Advanced spondylosis, without acute osseous finding. Electronically Signed   By: Abigail Miyamoto M.D.   On: 10/13/2015 15:08   Ct Head Wo Contrast  10/13/2015  CLINICAL DATA:  Fall, altered mental status EXAM: CT HEAD WITHOUT CONTRAST TECHNIQUE: Contiguous axial images were obtained from the base of the skull through the vertex without intravenous contrast. COMPARISON:  05/27/2012 FINDINGS: No skull fracture is noted. There is scalp swelling and mild subcutaneous stranding midline posterior parietal scalp axial image 27. No intracranial hemorrhage, mass effect or midline shift. Mild cerebral atrophy. Mild periventricular white matter decreased attenuation probable due to chronic small vessel ischemic changes. No acute cortical infarction. No mass lesion is noted on this unenhanced scan. IMPRESSION: No acute intracranial abnormality. Stable cerebral  atrophy. Mild scalp swelling and subcutaneous stranding midline posterior parietal region please see axial image 27. Electronically Signed   By: Lahoma Crocker M.D.   On: 10/13/2015 13:34   Mr Brain Wo Contrast  10/14/2015  CLINICAL DATA:  Altered mental status. EXAM: MRI HEAD WITHOUT CONTRAST TECHNIQUE: Multiplanar, multiecho pulse sequences of the brain and surrounding structures were obtained without intravenous contrast. COMPARISON:  CT head without contrast 10/13/2015. FINDINGS: The diffusion-weighted images demonstrate an acute nonhemorrhagic infarct involving the left caudate. There additional cortical foci of acute nonhemorrhagic infarct involving the left frontal operculum and left parietal lobe. T2 changes are associated with the areas of acute infarction. There is no associated hemorrhage. Additional extensive periventricular white matter changes are evident bilaterally. Remote cortical infarcts are present in the right parietal lobe as well. White matter changes extend into the brainstem. The cerebellum is unremarkable. Abnormal signal in the right vertebral artery is compatible with occlusion. The internal carotid arteries are patent bilaterally. The left vertebral artery is patent. The basilar artery is patent. Bilateral lens replacements are present. The globes orbits are otherwise within normal limits. The paranasal sinuses and mastoid air cells are clear. The skullbase is within normal limits. Midline structures demonstrate prominent soft tissue about the dens. The foramen magnum is patent. Midline structures are otherwise unremarkable. IMPRESSION: 1. Acute nonhemorrhagic infarct involving the left caudate. 2. Multiple scattered cortical acute nonhemorrhagic infarcts within the left MCA territory. These involve the left frontal operculum and left parietal lobe. 3. Remote cortical infarcts of the right parietal lobe. 4. Extensive atrophy and white matter disease compatible with a history of chronic  microvascular ischemia. 5. Occlusion of the right vertebral artery. These results were called by telephone at the time of interpretation on 10/14/2015 at 8:55 am to Dr. Luan Pulling, who verbally acknowledged these results. Electronically Signed   By: San Morelle M.D.   On: 10/14/2015 08:54    Medications:  Prior to Admission:  Prescriptions prior to admission  Medication Sig Dispense Refill Last Dose  . acetaminophen (TYLENOL) 650 MG CR tablet Take 650 mg  by mouth 2 (two) times daily.   10/12/2015 at Unknown time  . ANORO ELLIPTA 62.5-25 MCG/INH AEPB Inhale 1 Inhaler into the lungs daily.    10/12/2015 at Unknown time  . aspirin 81 MG tablet Take 81 mg by mouth daily.   10/12/2015 at Unknown time  . cycloSPORINE (RESTASIS) 0.05 % ophthalmic emulsion Place 1 drop into both eyes every 12 (twelve) hours.    10/12/2015 at Unknown time  . diltiazem (CARDIZEM CD) 180 MG 24 hr capsule TAKE ONE CAPSULE BY MOUTH ONCE DAILY. 30 capsule 6 10/12/2015 at Unknown time  . fluticasone (FLONASE) 50 MCG/ACT nasal spray Place 2 sprays into the nose daily as needed for allergies.    unknown at Unknown time  . furosemide (LASIX) 20 MG tablet TAKE 1 TABLET BY MOUTH DAILY. 30 tablet 6 10/12/2015 at Unknown time  . Multiple Vitamin (MULITIVITAMIN WITH MINERALS) TABS Take 1 tablet by mouth. Takes 1 tab 2-3 times a week   10/12/2015 at Unknown time  . omeprazole (PRILOSEC) 20 MG capsule Take 20 mg by mouth daily.     10/12/2015 at Unknown time  . Polyethyl Glycol-Propyl Glycol (SYSTANE OP) Apply 1 drop to eye daily. For dry eyes   10/12/2015 at Unknown time  . polyethylene glycol powder (GLYCOLAX/MIRALAX) powder Take 17 g by mouth as needed for mild constipation.    unknown at Unknown time  . potassium chloride SA (K-DUR,KLOR-CON) 20 MEQ tablet TAKE ONE TABLET BY MOUTH EVERY OTHER DAY. TAKE ONE THE DAY YOU TAKE FUROSEMIDE. 30 tablet 3 unknown  . PROAIR HFA 108 (90 BASE) MCG/ACT inhaler 2 puffs 3 (three) times daily as needed  for wheezing (cough).    unknown  . tiotropium (SPIRIVA) 18 MCG inhalation capsule Place 18 mcg into inhaler and inhale daily.     10/12/2015 at Unknown time  . VOLTAREN 1 % GEL Apply 2 g topically 2 (two) times daily.    10/12/2015 at Unknown time   Scheduled: . acetaminophen  650 mg Oral 3 times per day  . aspirin  81 mg Oral Daily  . cefTRIAXone (ROCEPHIN)  IV  1 g Intravenous Q24H  . cycloSPORINE  1 drop Both Eyes Q12H  . diclofenac sodium  2 g Topical BID  . diltiazem  180 mg Oral Daily  . enoxaparin (LOVENOX) injection  40 mg Subcutaneous Q24H  . fluticasone  2 spray Each Nare Daily  . furosemide  20 mg Oral Daily  . multivitamin with minerals  1 tablet Oral Daily  . pantoprazole  40 mg Oral Daily  . potassium chloride SA  20 mEq Oral Daily  . sodium chloride  3 mL Intravenous Q12H  . tiotropium  18 mcg Inhalation Daily  . Umeclidinium-Vilanterol  1 Inhaler Inhalation Daily   Continuous: . sodium chloride 100 mL/hr at 10/13/15 2137   YOV:ZCHYIFOYD, ondansetron **OR** ondansetron (ZOFRAN) IV, polyethylene glycol  Assesment: She has had a stroke based on her MRI. I think this is from atrial fibrillation but she is not a good candidate for anticoagulation because of multiple falls in the past. Active Problems:   Essential hypertension, benign   Atrial fibrillation (HCC)   Altered mental state   Elevated troponin    Plan: PT OT and speech consultation. She may need a rehabilitation stay.    LOS: 1 day   Becca Bayne L 10/14/2015, 9:08 AM

## 2015-10-14 NOTE — Progress Notes (Signed)
PT Cancellation Note  Patient Details Name: Wendy Perkins MRN: [id number] DOB: 02-Feb-1919   Cancelled Treatment:    Reason Eval/Treat Not Completed: Medical issues which prohibited therapy.  Per nursing report, symptoms of her stroke seem to be evolving.  Because of this, we will hold PT eval. Until tomorrow.   Demetrios Isaacs L   PT  10/14/2015, 1:03 PM [phone]

## 2015-10-14 NOTE — Evaluation (Signed)
Speech Language Pathology Evaluation Patient Details Name: Wendy Perkins MRN: [id number] DOB: 1919-05-06 Today's Date: 10/14/2015 Time: 1720-1736 SLP Time Calculation (min) (ACUTE ONLY): 16 min  Problem List:  Patient Active Problem List   Diagnosis Date Noted  . Altered mental state 10/13/2015  . Elevated troponin 10/13/2015  . Shingles 05/04/2015  . Rib pain on left side 11/15/2014  . Breast pain, left 11/15/2014  . Breast nodule 11/15/2014  . Orthostatic hypotension 09/30/2013  . Atrial fibrillation ([place]) 11/23/2011  . Chronic diastolic heart failure ([place]) 11/23/2011  . Essential hypertension, benign 07/13/2010  . DYSPNEA 11/15/2009   Past Medical History:  Past Medical History  Diagnosis Date  . Dyspnea     Previous CPX suggesting possible restrictive physiology, respiratory muscle fatigue, diastolic dysfunction  . Seasonal allergies   . Hyperlipidemia   . Essential hypertension, benign   . Carotid artery disease ([place])   . Dizziness   . Atrial fibrillation ([place])   . Toe fracture, left     left big toe  . Rib pain on left side 11/15/2014  . Breast pain, left 11/15/2014  . Breast nodule 11/15/2014  . Shingles 05/04/2015   Past Surgical History:  Past Surgical History  Procedure Laterality Date  . Colonoscopy  02/22/2012    Procedure: COLONOSCOPY;  Surgeon: Rogene Houston, MD;  Location: [facility];  Service: Endoscopy;  Laterality: N/A;  100   HPI:  Pt is a 81 year old lady, who lives by herself and manages all activities of daily living normally, was found by her son this morning sitting on the floor in the kitchen area. The patient remembers going from her bedroom to the kitchen but then cannot give me any clear history. She thinks she was lightheaded/dizzy. MRI showed acute nonhemorrhagic infarct involving the left caudate along with multiple scattered cortical acute nonhemorrhagic infarcts within the left MCA territory involving the left frontal operculum and left  parietal lobe, compromising speech and language function. ST to evaluate cogntive linguistic functioning    Assessment / Plan / Recommendation Clinical Impression  Pt presenting with moderate expressive aphasia and mild receptive aphasia secondary to left hemispheric CVA. Deficits characterized by anomic difficulties with phonemic and semantic paraphasias and deficits of auditory comprehension which worsen with increased complexity of language.  Pt aware of deficits with frustration apparent. Son reports changes in graphic expression with difficulty signing name. No dysarthria or apraxia observed. Repetition appearing intact at the word level.  Pt very stimulabe for increased expressive output with written, verbal, and visual cues. Very difficult to assess cognitive components due to breakdown of expressive/receptive output. Educated family extensively regarding cognitive linguistic deficits following left hemispheric CVA. SLP provided pt and family with picture communication board, to decrease frustration level and increase short term needs. Family reports intermittent assistance available at home, however they stated anticipating inability to provide 24 hour care pt would be requiring at this time. Recommend pt receive short term rehabilitation services via skilled nursing or outpatient speech therapy. ST to monitor DC planning in acute setting, and to follow up for cognitive linguistic intervention if available.     SLP Assessment       Follow Up Recommendations  Skilled Nursing facility;Outpatient SLP    Frequency and Duration min 3x week  1 week   Pertinent Vitals/Pain     SLP Goals  Potential to Achieve Goals (ACUTE ONLY): Good Potential Considerations (ACUTE ONLY): Family/community support;Cooperation/participation level  SLP Evaluation Prior Functioning  Cognitive/Linguistic Baseline: Within functional  limits Type of Home: House  Lives With: Alone Available Help at Discharge:  Family;Friend(s);Available PRN/intermittently Vocation: Retired   Associate Professor  Overall Cognitive Status: Impaired/Different from baseline Arousal/Alertness: Awake/alert Orientation Level: Oriented to person    Comprehension  Auditory Comprehension Overall Auditory Comprehension: Impaired Yes/No Questions: Impaired Basic Biographical Questions: 51-75% accurate Basic Immediate Environment Questions: 50-74% accurate Interfering Components: Processing speed;Hearing EffectiveTechniques: Slowed speech;Repetition;Extra processing time;Increased volume;Visual/Gestural cues Reading Comprehension Reading Status: Impaired Word level: Impaired    Expression Expression Primary Mode of Expression: Verbal Verbal Expression Overall Verbal Expression: Impaired Initiation: Impaired Automatic Speech: Singing Level of Generative/Spontaneous Verbalization: Word;Phrase Repetition: Impaired Level of Impairment: Sentence level Naming: Impairment Verbal Errors: Phonemic paraphasias;Semantic paraphasias;Aware of errors Effective Techniques: Written cues;Sentence completion;Semantic cues;Melodic intonation Written Expression Dominant Hand: Right Written Expression: Exceptions to [facility]   Oral / Motor Oral Motor/Sensory Function Overall Oral Motor/Sensory Function: Appears within functional limits for tasks assessed   GO    Arvil Chaco MA, [place] Speech Language Pathologist    Levi Aland 10/14/2015, 6:51 PM

## 2015-10-14 NOTE — Plan of Care (Signed)
Pt beside swallow screen conducted and showed no difficulties with pain, choking or aspiration s/sx.  Pt able to swallow medications whole. SLP ordered further assess and also address pt's speech.  RN will maintain aspiration precautions for now.

## 2015-10-15 MED ORDER — APIXABAN 5 MG PO TABS
5.0000 mg | ORAL_TABLET | Freq: Two times a day (BID) | ORAL | Status: DC
  Administered 2015-10-15 – 2015-10-17 (×4): 5 mg via ORAL
  Filled 2015-10-15 (×4): qty 1

## 2015-10-15 NOTE — Progress Notes (Signed)
ANTICOAGULATION CONSULT NOTE - Initial Consult  Pharmacy Consult for Eliquis Indication: non valvular atrial fibrillation  Allergies  Allergen Reactions  . Sulfur Swelling    Rash also per patient    Patient Measurements: Weight: 170 lb 3.8 oz (77.22 kg) Heparin Dosing Weight:   Vital Signs: Temp: 98.5 F (36.9 C) (11/05 1500) Temp Source: Oral (11/05 1500) BP: 147/41 mmHg (11/05 1859) Pulse Rate: 56 (11/05 1859)  Labs:  Recent Labs  10/13/15 1259  10/13/15 2027 10/14/15 0225 10/14/15 0821  HGB 15.3*  --   --  14.2  --   HCT 45.2  --   --  43.2  --   PLT 214  --   --  207  --   CREATININE 0.76  --   --  0.73  --   CKTOTAL 84  --   --   --   --   TROPONINI 0.08*  < > 0.15* 0.12* 0.07*  < > = values in this interval not displayed.  Estimated Creatinine Clearance: 41.3 mL/min (by C-G formula based on Cr of 0.73).   Medical History: Past Medical History  Diagnosis Date  . Dyspnea     Previous CPX suggesting possible restrictive physiology, respiratory muscle fatigue, diastolic dysfunction  . Seasonal allergies   . Hyperlipidemia   . Essential hypertension, benign   . Carotid artery disease ([place])   . Dizziness   . Atrial fibrillation ([place])   . Toe fracture, left     left big toe  . Rib pain on left side 11/15/2014  . Breast pain, left 11/15/2014  . Breast nodule 11/15/2014  . Shingles 05/04/2015    Medications:  Scheduled:  . acetaminophen  650 mg Oral 3 times per day  . apixaban  5 mg Oral BID  . aspirin  81 mg Oral Daily  . cefTRIAXone (ROCEPHIN)  IV  1 g Intravenous Q24H  . clopidogrel  75 mg Oral Daily  . cycloSPORINE  1 drop Both Eyes Q12H  . diclofenac sodium  2 g Topical BID  . diltiazem  180 mg Oral Daily  . fluticasone  2 spray Each Nare Daily  . furosemide  20 mg Oral Daily  . multivitamin with minerals  1 tablet Oral Daily  . pantoprazole  40 mg Oral Daily  . potassium chloride SA  20 mEq Oral Daily  . sodium chloride  3 mL Intravenous Q12H   . tiotropium  18 mcg Inhalation Daily  . Umeclidinium-Vilanterol  1 Inhaler Inhalation Daily    Assessment: Acute encephalopathy mostly due to acute embolic stroke involving the left hemisphere in the setting of chronic atrial fibrillation. She's had multiple falls in the past and anticoagulation was held because of that. Patient age 81, weight 77.2 kg, SCR 0.73 Last dose of Lovenox 40 mg SQ last evening 2206.  Goal of Therapy:  Stroke prophylaxis with atrial fibrillation Monitor platelets by anticoagulation protocol: Yes   Plan:  Eliquis 5 mg po bid Monitor CBC, platelets Monitor renal function. Reduce Eliquis dose to 2.5 mg bid if weight decrease < or equal 60 kg Reduce Eliquis dose to 2.5 mg bid if SCR > or equal 1.5  Wendy Perkins, Wendy Perkins 10/15/2015,9:29 PM

## 2015-10-15 NOTE — Progress Notes (Signed)
Subjective: She has had a stroke probably related to atrial fibrillation. She's had multiple falls in the past and anticoagulation was held because of that. She has significant speech issues.  Objective: Vital signs in last 24 hours: Temp:  [98.4 F (36.9 C)-99.1 F (37.3 C)] 98.8 F (37.1 C) (11/05 0700) Pulse Rate:  [70-87] 87 (11/05 0500) Resp:  [16-20] 18 (11/05 0700) BP: (114-186)/(45-84) 114/60 mmHg (11/05 0943) SpO2:  [95 %-100 %] 97 % (11/05 0754) Weight change:  Last BM Date: 10/12/15  Intake/Output from previous day: 11/04 0701 - 11/05 0700 In: 240 [P.O.:240] Out: 1200 [Urine:1200]  PHYSICAL EXAM General appearance: alert, cooperative and mild distress Resp: clear to auscultation bilaterally Cardio: irregularly irregular rhythm GI: soft, non-tender; bowel sounds normal; no masses,  no organomegaly Extremities: extremities normal, atraumatic, no cyanosis or edema  Lab Results:  Results for orders placed or performed during the hospital encounter of 10/13/15 (from the past 48 hour(s))  CBC with Differential/Platelet     Status: Abnormal   Collection Time: 10/13/15 12:59 PM  Result Value Ref Range   WBC 10.2 4.0 - 10.5 K/uL   RBC 5.30 (H) 3.87 - 5.11 MIL/uL   Hemoglobin 15.3 (H) 12.0 - 15.0 g/dL   HCT 45.2 36.0 - 46.0 %   MCV 85.3 78.0 - 100.0 fL   MCH 28.9 26.0 - 34.0 pg   MCHC 33.8 30.0 - 36.0 g/dL   RDW 13.6 11.5 - 15.5 %   Platelets 214 150 - 400 K/uL   Neutrophils Relative % 85 %   Neutro Abs 8.7 (H) 1.7 - 7.7 K/uL   Lymphocytes Relative 8 %   Lymphs Abs 0.8 0.7 - 4.0 K/uL   Monocytes Relative 7 %   Monocytes Absolute 0.8 0.1 - 1.0 K/uL   Eosinophils Relative 0 %   Eosinophils Absolute 0.0 0.0 - 0.7 K/uL   Basophils Relative 0 %   Basophils Absolute 0.0 0.0 - 0.1 K/uL  Comprehensive metabolic panel     Status: Abnormal   Collection Time: 10/13/15 12:59 PM  Result Value Ref Range   Sodium 138 135 - 145 mmol/L   Potassium 4.0 3.5 - 5.1 mmol/L    Chloride 107 101 - 111 mmol/L   CO2 20 (L) 22 - 32 mmol/L   Glucose, Bld 113 (H) 65 - 99 mg/dL   BUN 19 6 - 20 mg/dL   Creatinine, Ser 0.76 0.44 - 1.00 mg/dL   Calcium 9.3 8.9 - 10.3 mg/dL   Total Protein 6.9 6.5 - 8.1 g/dL   Albumin 3.9 3.5 - 5.0 g/dL   AST 26 15 - 41 U/L   ALT 21 14 - 54 U/L   Alkaline Phosphatase 93 38 - 126 U/L   Total Bilirubin 0.9 0.3 - 1.2 mg/dL   GFR calc non Af Amer >60 >60 mL/min   GFR calc Af Amer >60 >60 mL/min    Comment: (NOTE) The eGFR has been calculated using the CKD EPI equation. This calculation has not been validated in all clinical situations. eGFR's persistently <60 mL/min signify possible Chronic Kidney Disease.    Anion gap 11 5 - 15  CK     Status: None   Collection Time: 10/13/15 12:59 PM  Result Value Ref Range   Total CK 84 38 - 234 U/L  Troponin I     Status: Abnormal   Collection Time: 10/13/15 12:59 PM  Result Value Ref Range   Troponin I 0.08 (H) <0.031 ng/mL  Comment:        PERSISTENTLY INCREASED TROPONIN VALUES IN THE RANGE OF 0.04-0.49 ng/mL CAN BE SEEN IN:       -UNSTABLE ANGINA       -CONGESTIVE HEART FAILURE       -MYOCARDITIS       -CHEST TRAUMA       -ARRYHTHMIAS       -LATE PRESENTING MYOCARDIAL INFARCTION       -COPD   CLINICAL FOLLOW-UP RECOMMENDED.   Ammonia     Status: None   Collection Time: 10/13/15  1:00 PM  Result Value Ref Range   Ammonia 26 9 - 35 umol/L  Urinalysis, Routine w reflex microscopic (not at [facility]     Status: Abnormal   Collection Time: 10/13/15  1:51 PM  Result Value Ref Range   Color, Urine YELLOW YELLOW   APPearance HAZY (A) CLEAR   Specific Gravity, Urine 1.025 1.005 - 1.030   pH 6.0 5.0 - 8.0   Glucose, UA NEGATIVE NEGATIVE mg/dL   Hgb urine dipstick TRACE (A) NEGATIVE   Bilirubin Urine NEGATIVE NEGATIVE   Ketones, ur TRACE (A) NEGATIVE mg/dL   Protein, ur 100 (A) NEGATIVE mg/dL   Urobilinogen, UA 1.0 0.0 - 1.0 mg/dL   Nitrite NEGATIVE NEGATIVE   Leukocytes, UA SMALL (A)  NEGATIVE  Urine microscopic-add on     Status: Abnormal   Collection Time: 10/13/15  1:51 PM  Result Value Ref Range   Squamous Epithelial / LPF FEW (A) RARE   WBC, UA TOO NUMEROUS TO COUNT <3 WBC/hpf   RBC / HPF 3-6 <3 RBC/hpf   Bacteria, UA MANY (A) RARE  Troponin I     Status: Abnormal   Collection Time: 10/13/15  2:16 PM  Result Value Ref Range   Troponin I 0.11 (H) <0.031 ng/mL    Comment:        PERSISTENTLY INCREASED TROPONIN VALUES IN THE RANGE OF 0.04-0.49 ng/mL CAN BE SEEN IN:       -UNSTABLE ANGINA       -CONGESTIVE HEART FAILURE       -MYOCARDITIS       -CHEST TRAUMA       -ARRYHTHMIAS       -LATE PRESENTING MYOCARDIAL INFARCTION       -COPD   CLINICAL FOLLOW-UP RECOMMENDED.   Troponin I     Status: Abnormal   Collection Time: 10/13/15  8:27 PM  Result Value Ref Range   Troponin I 0.15 (H) <0.031 ng/mL    Comment:        PERSISTENTLY INCREASED TROPONIN VALUES IN THE RANGE OF 0.04-0.49 ng/mL CAN BE SEEN IN:       -UNSTABLE ANGINA       -CONGESTIVE HEART FAILURE       -MYOCARDITIS       -CHEST TRAUMA       -ARRYHTHMIAS       -LATE PRESENTING MYOCARDIAL INFARCTION       -COPD   CLINICAL FOLLOW-UP RECOMMENDED.   Comprehensive metabolic panel     Status: Abnormal   Collection Time: 10/14/15  2:25 AM  Result Value Ref Range   Sodium 141 135 - 145 mmol/L   Potassium 4.1 3.5 - 5.1 mmol/L   Chloride 109 101 - 111 mmol/L   CO2 24 22 - 32 mmol/L   Glucose, Bld 93 65 - 99 mg/dL   BUN 16 6 - 20 mg/dL   Creatinine, Ser 0.73 0.44 - 1.00 mg/dL  Calcium 8.6 (L) 8.9 - 10.3 mg/dL   Total Protein 6.1 (L) 6.5 - 8.1 g/dL   Albumin 3.4 (L) 3.5 - 5.0 g/dL   AST 27 15 - 41 U/L   ALT 19 14 - 54 U/L   Alkaline Phosphatase 84 38 - 126 U/L   Total Bilirubin 0.9 0.3 - 1.2 mg/dL   GFR calc non Af Amer >60 >60 mL/min   GFR calc Af Amer >60 >60 mL/min    Comment: (NOTE) The eGFR has been calculated using the CKD EPI equation. This calculation has not been validated in all  clinical situations. eGFR's persistently <60 mL/min signify possible Chronic Kidney Disease.    Anion gap 8 5 - 15  CBC     Status: None   Collection Time: 10/14/15  2:25 AM  Result Value Ref Range   WBC 8.1 4.0 - 10.5 K/uL   RBC 5.01 3.87 - 5.11 MIL/uL   Hemoglobin 14.2 12.0 - 15.0 g/dL   HCT 43.2 36.0 - 46.0 %   MCV 86.2 78.0 - 100.0 fL   MCH 28.3 26.0 - 34.0 pg   MCHC 32.9 30.0 - 36.0 g/dL   RDW 13.7 11.5 - 15.5 %   Platelets 207 150 - 400 K/uL  Troponin I     Status: Abnormal   Collection Time: 10/14/15  2:25 AM  Result Value Ref Range   Troponin I 0.12 (H) <0.031 ng/mL    Comment:        PERSISTENTLY INCREASED TROPONIN VALUES IN THE RANGE OF 0.04-0.49 ng/mL CAN BE SEEN IN:       -UNSTABLE ANGINA       -CONGESTIVE HEART FAILURE       -MYOCARDITIS       -CHEST TRAUMA       -ARRYHTHMIAS       -LATE PRESENTING MYOCARDIAL INFARCTION       -COPD   CLINICAL FOLLOW-UP RECOMMENDED.   Troponin I     Status: Abnormal   Collection Time: 10/14/15  8:21 AM  Result Value Ref Range   Troponin I 0.07 (H) <0.031 ng/mL    Comment:        PERSISTENTLY INCREASED TROPONIN VALUES IN THE RANGE OF 0.04-0.49 ng/mL CAN BE SEEN IN:       -UNSTABLE ANGINA       -CONGESTIVE HEART FAILURE       -MYOCARDITIS       -CHEST TRAUMA       -ARRYHTHMIAS       -LATE PRESENTING MYOCARDIAL INFARCTION       -COPD   CLINICAL FOLLOW-UP RECOMMENDED.     ABGS No results for input(s): PHART, PO2ART, TCO2, HCO3 in the last 72 hours.  Invalid input(s): PCO2 CULTURES No results found for this or any previous visit (from the past 240 hour(s)). Studies/Results: Dg Thoracic Spine 2 View  10/13/2015  CLINICAL DATA:  Initial encounter for Pt fell today/pain back EXAM: THORACIC SPINE 2 VIEWS COMPARISON:  Chest radiograph of 08/25/2013 FINDINGS: S-shaped thoracic spine curvature. Cardiomegaly. Atherosclerosis in the transverse aorta. Swimmer's view suboptimal with the cervical thoracic junction and upper  thoracic spine not well evaluated. The first lateral view images approximately from the top of T2 through the bottom of T12. Spondylosis, with maintenance of vertebral body height across these levels. IMPRESSION: Suboptimal evaluation of the cervicothoracic junction and upper most thoracic spine. Given this factor, spondylosis but no convincing evidence of acute superimposed process. Electronically Signed   By: Marylyn Ishihara  Jobe Igo M.D.   On: 10/13/2015 15:11   Dg Lumbar Spine Complete  10/13/2015  CLINICAL DATA:  Initial encounter for Pt fell today/pain back EXAM: LUMBAR SPINE - COMPLETE 4+ VIEW COMPARISON:  Lumbar spine MRI of 04/25/2012 FINDINGS: Diminutive right twelfth rib. Given this nomenclature, 5 lumbar type vertebral bodies with mild to moderate convex left curvature. Aortic and branch vessel atherosclerosis. Osteopenia. Lateral view mildly degraded by the extent of spinal curvature and osteopenia. Given this factor, vertebral body height alignment are maintained. Advanced facet arthropathy throughout the lower lumbar spine. IMPRESSION: Advanced spondylosis, without acute osseous finding. Electronically Signed   By: Abigail Miyamoto M.D.   On: 10/13/2015 15:08   Dg Wrist Complete Left  10/14/2015  CLINICAL DATA:  CVA.  Fall.  Left first ray pain. EXAM: LEFT WRIST - COMPLETE 3+ VIEW COMPARISON:  None. FINDINGS: No fracture, dislocation or suspicious focal osseous lesion is seen in the left wrist. There is severe osteoarthritis at the first carpometacarpal joint. Severe fifth and moderate third metacarpophalangeal joint osteoarthritis. Moderate interphalangeal joint left thumb osteoarthritis. Tiny 4 mm metallic density foreign body in the soft tissues volar to the distal left second metacarpal. IMPRESSION: 1. No fracture or dislocation in the left wrist. 2. 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 3. Tiny 4 mm metallic density foreign body in the soft tissues volar to the  distal left second metacarpal. Electronically Signed   By: Ilona Sorrel M.D.   On: 10/14/2015 20:34   Ct Head Wo Contrast  10/13/2015  CLINICAL DATA:  Fall, altered mental status EXAM: CT HEAD WITHOUT CONTRAST TECHNIQUE: Contiguous axial images were obtained from the base of the skull through the vertex without intravenous contrast. COMPARISON:  05/27/2012 FINDINGS: No skull fracture is noted. There is scalp swelling and mild subcutaneous stranding midline posterior parietal scalp axial image 27. No intracranial hemorrhage, mass effect or midline shift. Mild cerebral atrophy. Mild periventricular white matter decreased attenuation probable due to chronic small vessel ischemic changes. No acute cortical infarction. No mass lesion is noted on this unenhanced scan. IMPRESSION: No acute intracranial abnormality. Stable cerebral atrophy. Mild scalp swelling and subcutaneous stranding midline posterior parietal region please see axial image 27. Electronically Signed   By: Lahoma Crocker M.D.   On: 10/13/2015 13:34   Mr Brain Wo Contrast  10/14/2015  CLINICAL DATA:  Altered mental status. EXAM: MRI HEAD WITHOUT CONTRAST TECHNIQUE: Multiplanar, multiecho pulse sequences of the brain and surrounding structures were obtained without intravenous contrast. COMPARISON:  CT head without contrast 10/13/2015. FINDINGS: The diffusion-weighted images demonstrate an acute nonhemorrhagic infarct involving the left caudate. There additional cortical foci of acute nonhemorrhagic infarct involving the left frontal operculum and left parietal lobe. T2 changes are associated with the areas of acute infarction. There is no associated hemorrhage. Additional extensive periventricular white matter changes are evident bilaterally. Remote cortical infarcts are present in the right parietal lobe as well. White matter changes extend into the brainstem. The cerebellum is unremarkable. Abnormal signal in the right vertebral artery is compatible  with occlusion. The internal carotid arteries are patent bilaterally. The left vertebral artery is patent. The basilar artery is patent. Bilateral lens replacements are present. The globes orbits are otherwise within normal limits. The paranasal sinuses and mastoid air cells are clear. The skullbase is within normal limits. Midline structures demonstrate prominent soft tissue about the dens. The foramen magnum is patent. Midline structures are otherwise unremarkable. IMPRESSION: 1. Acute nonhemorrhagic infarct involving  the left caudate. 2. Multiple scattered cortical acute nonhemorrhagic infarcts within the left MCA territory. These involve the left frontal operculum and left parietal lobe. 3. Remote cortical infarcts of the right parietal lobe. 4. Extensive atrophy and white matter disease compatible with a history of chronic microvascular ischemia. 5. Occlusion of the right vertebral artery. These results were called by telephone at the time of interpretation on 10/14/2015 at 8:55 am to Dr. Luan Pulling, who verbally acknowledged these results. Electronically Signed   By: San Morelle M.D.   On: 10/14/2015 08:54   Dg Hand Complete Left  10/14/2015  CLINICAL DATA:  Fall.  Left first ray pain. EXAM: LEFT HAND - COMPLETE 3+ VIEW COMPARISON:  None. FINDINGS: No fracture, dislocation or suspicious focal osseous lesion in the left wrist. Tiny 4 mm metallic density foreign body in the soft tissues volar to the distal left second metacarpal. Severe polyarticular erosive osteoarthritis throughout the left hand and wrist, most prominently involving the second through fifth distal interphalangeal joints and third and fourth proximal interphalangeal joints. IMPRESSION: 1. No fracture or dislocation in the left hand. 2. Severe polyarticular erosive osteoarthritis in the left hand as described. 3. Tiny 4 mm metallic density foreign body in the soft tissues volar to the distal left second metacarpal. Electronically Signed    By: Ilona Sorrel M.D.   On: 10/14/2015 20:36    Medications:  Prior to Admission:  Prescriptions prior to admission  Medication Sig Dispense Refill Last Dose  . acetaminophen (TYLENOL) 650 MG CR tablet Take 650 mg by mouth 2 (two) times daily.   10/12/2015 at Unknown time  . ANORO ELLIPTA 62.5-25 MCG/INH AEPB Inhale 1 Inhaler into the lungs daily.    10/12/2015 at Unknown time  . aspirin 81 MG tablet Take 81 mg by mouth daily.   10/12/2015 at Unknown time  . cycloSPORINE (RESTASIS) 0.05 % ophthalmic emulsion Place 1 drop into both eyes every 12 (twelve) hours.    10/12/2015 at Unknown time  . diltiazem (CARDIZEM CD) 180 MG 24 hr capsule TAKE ONE CAPSULE BY MOUTH ONCE DAILY. 30 capsule 6 10/12/2015 at Unknown time  . fluticasone (FLONASE) 50 MCG/ACT nasal spray Place 2 sprays into the nose daily as needed for allergies.    unknown at Unknown time  . furosemide (LASIX) 20 MG tablet TAKE 1 TABLET BY MOUTH DAILY. 30 tablet 6 10/12/2015 at Unknown time  . Multiple Vitamin (MULITIVITAMIN WITH MINERALS) TABS Take 1 tablet by mouth. Takes 1 tab 2-3 times a week   10/12/2015 at Unknown time  . omeprazole (PRILOSEC) 20 MG capsule Take 20 mg by mouth daily.     10/12/2015 at Unknown time  . Polyethyl Glycol-Propyl Glycol (SYSTANE OP) Apply 1 drop to eye daily. For dry eyes   10/12/2015 at Unknown time  . polyethylene glycol powder (GLYCOLAX/MIRALAX) powder Take 17 g by mouth as needed for mild constipation.    unknown at Unknown time  . potassium chloride SA (K-DUR,KLOR-CON) 20 MEQ tablet TAKE ONE TABLET BY MOUTH EVERY OTHER DAY. TAKE ONE THE DAY YOU TAKE FUROSEMIDE. 30 tablet 3 unknown  . PROAIR HFA 108 (90 BASE) MCG/ACT inhaler 2 puffs 3 (three) times daily as needed for wheezing (cough).    unknown  . tiotropium (SPIRIVA) 18 MCG inhalation capsule Place 18 mcg into inhaler and inhale daily.     10/12/2015 at Unknown time  . VOLTAREN 1 % GEL Apply 2 g topically 2 (two) times daily.    10/12/2015 at  Unknown time    Scheduled: . acetaminophen  650 mg Oral 3 times per day  . aspirin  81 mg Oral Daily  . cefTRIAXone (ROCEPHIN)  IV  1 g Intravenous Q24H  . clopidogrel  75 mg Oral Daily  . cycloSPORINE  1 drop Both Eyes Q12H  . diclofenac sodium  2 g Topical BID  . diltiazem  180 mg Oral Daily  . enoxaparin (LOVENOX) injection  40 mg Subcutaneous Q24H  . fluticasone  2 spray Each Nare Daily  . furosemide  20 mg Oral Daily  . multivitamin with minerals  1 tablet Oral Daily  . pantoprazole  40 mg Oral Daily  . potassium chloride SA  20 mEq Oral Daily  . sodium chloride  3 mL Intravenous Q12H  . tiotropium  18 mcg Inhalation Daily  . Umeclidinium-Vilanterol  1 Inhaler Inhalation Daily   Continuous: . sodium chloride 100 mL/hr at 10/15/15 0807   IDU:[id number], ondansetron **OR** ondansetron (ZOFRAN) IV, polyethylene glycol  Assesment: She has hypertension which is pretty stable. She had a stroke which is probably related to atrial fibrillation. She has significant speech issues. It has been recommended that she go to skilled care facility and I think that is appropriate Active Problems:   Essential hypertension, benign   Atrial fibrillation (HCC)   Altered mental state   Elevated troponin    Plan: Continue treatments.    LOS: 2 days   Aimi Essner L 10/15/2015, 11:18 AM

## 2015-10-15 NOTE — Consult Note (Signed)
[place] A. Merlene Laughter, MD     [email or web address]          Wendy Perkins is an 81 y.o. female.   ASSESSMENT/PLAN: cute encephalopathy mostly due to acute embolic stroke involving the left hemisphere in the setting of chronic atrial fibrillation. The patient also has a bladder infection. Atrial fibrillation. Hypertension.  RECOMMENDATION: The patient should be placed on anticoagulation long-term. Elliquis will be started. Follow-up echocardiography and carotid duplex Doppler. Physical occupational therapy and speech.  The patient is a 81 year old white female who is highly functional at baseline. She is by herself and does all her ADLs. The patient was found on the floor confused and disoriented. She apparently fell. She tells me that she fell once and got up trying to walk around fell again. She cannot get up on the second time. Patient may have had some dizziness. She denies any focal numbness or weakness but she does report her legs is felt as if they could not hold her up. The patient denies chest pain or shortness of breath. She does report having some language difficulties and difficulties with speech. Her symptoms have improved since she has been hospitalized. The patient has chronic atrial fibrillation and per her cardiologist know, she apparently refused anticoagulation. The exact reasons aren't known but could be due to inconvenience of warfarin therapy. The review of systems is otherwise unremarkable.  GENERAL: She is in no acute distress.  HEENT: Supple. Atraumatic normocephalic.   ABDOMEN: soft  EXTREMITIES: No edema. Reduced arm abduction bilaterally especially the right due to pain. Marked arthritic changes of the knees especially on the right side. Status post right knee surgery.   BACK: Normal.  SKIN: Normal by inspection.    MENTAL STATUS: Alert and oriented hospital and month although she thinks is 2018. There is some mild language impairment  with word finding or comprehension is good. Judgement and insight normal.   CRANIAL NERVES: Pupils are equal, round and reactive to light and accommodation; extra ocular movements are full, there is no significant nystagmus; visual fields are full; upper and lower facial muscles are normal in strength and symmetric, there is no flattening of the nasolabial folds; tongue is midline; uvula is midline; shoulder elevation is normal.  MOTOR: Deltoids 4 minus/5. Hand grip 5. Hip flexion 5/5 and dorsiflexion 5.  COORDINATION: Left finger to nose is normal, right finger to nose is normal, No rest tremor; no intention tremor; no postural tremor; no bradykinesia.  REFLEXES: Deep tendon reflexes are symmetrical and normal in the upper extremities and absent at the knees. Babinski reflexes are flexor bilaterally.   SENSATION: Normal to light touch.   Blood pressure 145/70, pulse 82, temperature 97.8 F (36.6 C), temperature source Oral, resp. rate 18, weight 77.22 kg (170 lb 3.8 oz), SpO2 95 %.  Past Medical History  Diagnosis Date  . Dyspnea     Previous CPX suggesting possible restrictive physiology, respiratory muscle fatigue, diastolic dysfunction  . Seasonal allergies   . Hyperlipidemia   . Essential hypertension, benign   . Carotid artery disease ([place])   . Dizziness   . Atrial fibrillation ([place])   . Toe fracture, left     left big toe  . Rib pain on left side 11/15/2014  . Breast pain, left 11/15/2014  . Breast nodule 11/15/2014  . Shingles 05/04/2015    Past Surgical History  Procedure Laterality Date  . Colonoscopy  02/22/2012    Procedure: COLONOSCOPY;  Surgeon: Rogene Houston,  MD;  Location: [facility];  Service: Endoscopy;  Laterality: N/A;  100    Family History  Problem Relation Age of Onset  . Stroke Mother   . Pneumonia Father   . Heart disease Father   . Healthy Son   . Glaucoma Daughter   . Emphysema Daughter   . Healthy Daughter   . Other Daughter     had knee  replacement  . Healthy Son   . Heart disease Maternal Grandmother     Social History:  reports that she has never smoked. She has never used smokeless tobacco. She reports that she does not drink alcohol or use illicit drugs.  Allergies:  Allergies  Allergen Reactions  . Sulfur Swelling    Rash also per patient    Medications: Prior to Admission medications   Medication Sig Start Date End Date Taking? Authorizing Provider  acetaminophen (TYLENOL) 650 MG CR tablet Take 650 mg by mouth 2 (two) times daily.   Yes Historical Provider, MD  ANORO ELLIPTA 62.5-25 MCG/INH AEPB Inhale 1 Inhaler into the lungs daily.  10/12/14  Yes Historical Provider, MD  aspirin 81 MG tablet Take 81 mg by mouth daily.   Yes Historical Provider, MD  cycloSPORINE (RESTASIS) 0.05 % ophthalmic emulsion Place 1 drop into both eyes every 12 (twelve) hours.    Yes Historical Provider, MD  diltiazem (CARDIZEM CD) 180 MG 24 hr capsule TAKE ONE CAPSULE BY MOUTH ONCE DAILY. 05/10/15  Yes Satira Sark, MD  fluticasone (FLONASE) 50 MCG/ACT nasal spray Place 2 sprays into the nose daily as needed for allergies.    Yes Historical Provider, MD  furosemide (LASIX) 20 MG tablet TAKE 1 TABLET BY MOUTH DAILY. 04/25/15  Yes Satira Sark, MD  Multiple Vitamin (MULITIVITAMIN WITH MINERALS) TABS Take 1 tablet by mouth. Takes 1 tab 2-3 times a week   Yes Historical Provider, MD  omeprazole (PRILOSEC) 20 MG capsule Take 20 mg by mouth daily.     Yes Historical Provider, MD  Polyethyl Glycol-Propyl Glycol (SYSTANE OP) Apply 1 drop to eye daily. For dry eyes   Yes Historical Provider, MD  polyethylene glycol powder (GLYCOLAX/MIRALAX) powder Take 17 g by mouth as needed for mild constipation.  01/16/12  Yes Historical Provider, MD  potassium chloride SA (K-DUR,KLOR-CON) 20 MEQ tablet TAKE ONE TABLET BY MOUTH EVERY OTHER DAY. TAKE ONE THE DAY YOU TAKE FUROSEMIDE. 06/14/15  Yes Lendon Colonel, NP  PROAIR HFA 108 (90 BASE) MCG/ACT  inhaler 2 puffs 3 (three) times daily as needed for wheezing (cough).  05/13/15  Yes Historical Provider, MD  tiotropium (SPIRIVA) 18 MCG inhalation capsule Place 18 mcg into inhaler and inhale daily.     Yes Historical Provider, MD  VOLTAREN 1 % GEL Apply 2 g topically 2 (two) times daily.  02/17/15  Yes Historical Provider, MD    Scheduled Meds: . acetaminophen  650 mg Oral 3 times per day  . aspirin  81 mg Oral Daily  . cefTRIAXone (ROCEPHIN)  IV  1 g Intravenous Q24H  . clopidogrel  75 mg Oral Daily  . cycloSPORINE  1 drop Both Eyes Q12H  . diclofenac sodium  2 g Topical BID  . diltiazem  180 mg Oral Daily  . enoxaparin (LOVENOX) injection  40 mg Subcutaneous Q24H  . fluticasone  2 spray Each Nare Daily  . furosemide  20 mg Oral Daily  . multivitamin with minerals  1 tablet Oral Daily  . pantoprazole  40 mg Oral Daily  . potassium chloride SA  20 mEq Oral Daily  . sodium chloride  3 mL Intravenous Q12H  . tiotropium  18 mcg Inhalation Daily  . Umeclidinium-Vilanterol  1 Inhaler Inhalation Daily   Continuous Infusions: . sodium chloride 100 mL/hr at 10/15/15 0807   PRN Meds:.albuterol, ondansetron **OR** ondansetron (ZOFRAN) IV, polyethylene glycol     Results for orders placed or performed during the hospital encounter of 10/13/15 (from the past 48 hour(s))  Troponin I     Status: Abnormal   Collection Time: 10/13/15  8:27 PM  Result Value Ref Range   Troponin I 0.15 (H) <0.031 ng/mL    Comment:        PERSISTENTLY INCREASED TROPONIN VALUES IN THE RANGE OF 0.04-0.49 ng/mL CAN BE SEEN IN:       -UNSTABLE ANGINA       -CONGESTIVE HEART FAILURE       -MYOCARDITIS       -CHEST TRAUMA       -ARRYHTHMIAS       -LATE PRESENTING MYOCARDIAL INFARCTION       -COPD   CLINICAL FOLLOW-UP RECOMMENDED.   Comprehensive metabolic panel     Status: Abnormal   Collection Time: 10/14/15  2:25 AM  Result Value Ref Range   Sodium 141 135 - 145 mmol/L   Potassium 4.1 3.5 - 5.1 mmol/L     Chloride 109 101 - 111 mmol/L   CO2 24 22 - 32 mmol/L   Glucose, Bld 93 65 - 99 mg/dL   BUN 16 6 - 20 mg/dL   Creatinine, Ser 0.73 0.44 - 1.00 mg/dL   Calcium 8.6 (L) 8.9 - 10.3 mg/dL   Total Protein 6.1 (L) 6.5 - 8.1 g/dL   Albumin 3.4 (L) 3.5 - 5.0 g/dL   AST 27 15 - 41 U/L   ALT 19 14 - 54 U/L   Alkaline Phosphatase 84 38 - 126 U/L   Total Bilirubin 0.9 0.3 - 1.2 mg/dL   GFR calc non Af Amer >60 >60 mL/min   GFR calc Af Amer >60 >60 mL/min    Comment: (NOTE) The eGFR has been calculated using the CKD EPI equation. This calculation has not been validated in all clinical situations. eGFR's persistently <60 mL/min signify possible Chronic Kidney Disease.    Anion gap 8 5 - 15  CBC     Status: None   Collection Time: 10/14/15  2:25 AM  Result Value Ref Range   WBC 8.1 4.0 - 10.5 K/uL   RBC 5.01 3.87 - 5.11 MIL/uL   Hemoglobin 14.2 12.0 - 15.0 g/dL   HCT 43.2 36.0 - 46.0 %   MCV 86.2 78.0 - 100.0 fL   MCH 28.3 26.0 - 34.0 pg   MCHC 32.9 30.0 - 36.0 g/dL   RDW 13.7 11.5 - 15.5 %   Platelets 207 150 - 400 K/uL  Troponin I     Status: Abnormal   Collection Time: 10/14/15  2:25 AM  Result Value Ref Range   Troponin I 0.12 (H) <0.031 ng/mL    Comment:        PERSISTENTLY INCREASED TROPONIN VALUES IN THE RANGE OF 0.04-0.49 ng/mL CAN BE SEEN IN:       -UNSTABLE ANGINA       -CONGESTIVE HEART FAILURE       -MYOCARDITIS       -CHEST TRAUMA       -ARRYHTHMIAS       -  LATE PRESENTING MYOCARDIAL INFARCTION       -COPD   CLINICAL FOLLOW-UP RECOMMENDED.   Troponin I     Status: Abnormal   Collection Time: 10/14/15  8:21 AM  Result Value Ref Range   Troponin I 0.07 (H) <0.031 ng/mL    Comment:        PERSISTENTLY INCREASED TROPONIN VALUES IN THE RANGE OF 0.04-0.49 ng/mL CAN BE SEEN IN:       -UNSTABLE ANGINA       -CONGESTIVE HEART FAILURE       -MYOCARDITIS       -CHEST TRAUMA       -ARRYHTHMIAS       -LATE PRESENTING MYOCARDIAL INFARCTION       -COPD   CLINICAL  FOLLOW-UP RECOMMENDED.     Studies/Results:  BRAIN MRI 1. Acute nonhemorrhagic infarct involving the left caudate. 2. Multiple scattered cortical acute nonhemorrhagic infarcts within the left MCA territory. These involve the left frontal operculum and left parietal lobe. 3. Remote cortical infarcts of the right parietal lobe. 4. Extensive atrophy and white matter disease compatible with a history of chronic microvascular ischemia. 5. Occlusion of the right vertebral artery.   ECHO - ((((2014)))) - Study data: Technically adequate study - Left ventricle: The cavity size was normal. Wall thickness was normal. Systolic function was normal. The estimated ejection fraction was in the range of 60% to 65%. Doppler parameters are consistent with restrictive physiology, indicative of decreased left ventricular diastolic compliance and/or increased left atrial pressure. - Aortic valve: Mildly calcified annulus. Trileaflet; moderately thickened leaflets. Valve area: 1.65cm^2(VTI). Valve area: 1.62cm^2 (Vmax). - Mitral valve: Mildly to moderately calcified annulus. Mildly thickened leaflets . Mild regurgitation. - Left atrium: The atrium was severely dilated. - Right ventricle: The cavity size was moderately dilated, it shares the apex with the left ventricle. Systolic function was mildly reduced. RV TAPSE is 1.4 cm. - Right atrium: The atrium was severely dilated. - Atrial septum: There was increased thickness of the septum, consistent with lipomatous hypertrophy. - Tricuspid valve: Mild-moderate regurgitation. - Pulmonary arteries: PA peak pressure: 47m Hg (S).  Wendy Perkins A. DMerlene Laughter M.D.  Diplomate, ATax adviserof Psychiatry and Neurology ( Neurology). 10/15/2015, 4:12 PM

## 2015-10-15 NOTE — Evaluation (Signed)
Physical Therapy Evaluation Patient Details Name: Wendy Perkins MRN: [id number] DOB: Apr 11, 1919 Today's Date: 10/15/2015   History of Present Illness  This is a 81 year old lady, who lives by herself and manages all activities of daily living normally, was found by her son this morning sitting on the floor in the kitchen area. The patient remembers going from her bedroom to the kitchen but then cannot give me any clear history. She thinks she was lightheaded/dizzy. She thinks that she did not lose consciousness. She denies any chest pain, palpitations, dyspnea. According to the sons, who are at the bedside, she has been more confused earlier this morning but appears to be slightly better now. One of the sons describes foul-smelling urine. She is now being admitted for further investigation.  She has been found to have had an acute infarct in the left caudate and the left MCA territory.  Clinical Impression   Pt is seen today for initial evaluation.  She is very alert and oriented, able to follow all directions.  She is able to express herself in simple sentences but does acknowledge word finding difficulty.  She is HOH.  Pt normally lives alone and is independent in all ADLs with a walker, even drives short distances.  Evaluation reveals strength to be WNL and equal from right to left.  She has a painful right knee due to deterioration of a right TKR.  Her coordination and balance are WNL.  She currently needs close guarding with sit to stand transfer and her gait endurance is decreased.  She is not safe to return home alone and I am therefore recommending SNF at d/c.     Follow Up Recommendations SNF    Equipment Recommendations  None recommended by PT    Recommendations for Other Services   ST, OT    Precautions / Restrictions Precautions Precautions: Fall Restrictions Weight Bearing Restrictions: No      Mobility  Bed Mobility Overal bed mobility: Modified Independent;Needs  Assistance Bed Mobility: Supine to Sit     Supine to sit: Supervision        Transfers Overall transfer level: Needs assistance Equipment used: Rolling walker (2 wheeled) Transfers: Sit to/from Stand Sit to Stand: Min guard            Ambulation/Gait Ambulation/Gait assistance: Min guard Ambulation Distance (Feet): 40 Feet Assistive device: Rolling walker (2 wheeled) Gait Pattern/deviations: Antalgic;Decreased stance time - right Gait velocity: appropriate for her age Gait velocity interpretation: <1.8 ft/sec, indicative of risk for recurrent falls General Gait Details: Pt has an arthritic right knee which has some pain with weight bearing  Stairs            Wheelchair Mobility    Modified Rankin (Stroke Patients Only) Modified Rankin (Stroke Patients Only) Pre-Morbid Rankin Score: No symptoms Modified Rankin: Slight disability     Balance Overall balance assessment:  (balance is what one would expect at her age/OA right knee)                                           Pertinent Vitals/Pain Pain Assessment: No/denies pain    Home Living Family/patient expects to be discharged to:: Skilled nursing facility                      Prior Function Level of Independence: Independent with assistive device(s)  Hand Dominance   Dominant Hand: Right    Extremity/Trunk Assessment   Upper Extremity Assessment: Overall WFL for tasks assessed           Lower Extremity Assessment: Overall WFL for tasks assessed      Cervical / Trunk Assessment: Kyphotic  Communication   Communication: Expressive difficulties  Cognition Arousal/Alertness: Awake/alert Behavior During Therapy: WFL for tasks assessed/performed Overall Cognitive Status: Within Functional Limits for tasks assessed                      General Comments      Exercises        Assessment/Plan    PT Assessment Patient needs  continued PT services  PT Diagnosis Difficulty walking (decreased endurance)   PT Problem List Decreased activity tolerance;Decreased mobility  PT Treatment Interventions Gait training;Functional mobility training;Therapeutic exercise   PT Goals (Current goals can be found in the Care Plan section) Acute Rehab PT Goals Patient Stated Goal: none stated PT Goal Formulation: With patient/family Time For Goal Achievement: 10/29/15 Potential to Achieve Goals: Good    Frequency Min 5X/week   Barriers to discharge Decreased caregiver support pt lives alone    Co-evaluation               End of Session Equipment Utilized During Treatment: Gait belt Activity Tolerance: Patient tolerated treatment well Patient left: in chair;with call bell/phone within reach;with family/visitor present           Time: 2992-4268 PT Time Calculation (min) (ACUTE ONLY): 30 min   Charges:   PT Evaluation $Initial PT Evaluation Tier I: 1 Procedure     PT G CodesDemetrios Perkins L  PT 10/15/2015, 10:25 AM [phone]

## 2015-10-16 ENCOUNTER — Inpatient Hospital Stay (HOSPITAL_COMMUNITY): Payer: Medicare Other

## 2015-10-16 DIAGNOSIS — I6789 Other cerebrovascular disease: Secondary | ICD-10-CM

## 2015-10-16 LAB — CBC
HEMATOCRIT: 42.6 % (ref 36.0–46.0)
HEMOGLOBIN: 13.9 g/dL (ref 12.0–15.0)
MCH: 28.4 pg (ref 26.0–34.0)
MCHC: 32.6 g/dL (ref 30.0–36.0)
MCV: 87.1 fL (ref 78.0–100.0)
Platelets: 183 10*3/uL (ref 150–400)
RBC: 4.89 MIL/uL (ref 3.87–5.11)
RDW: 13.7 % (ref 11.5–15.5)
WBC: 9 10*3/uL (ref 4.0–10.5)

## 2015-10-16 LAB — LIPID PANEL
Cholesterol: 138 mg/dL (ref 0–200)
HDL: 52 mg/dL (ref >40)
LDL CALC: 77 mg/dL (ref 0–99)
Total CHOL/HDL Ratio: 2.7 RATIO
Triglycerides: 43 mg/dL (ref <150)
VLDL: 9 mg/dL (ref 0–40)

## 2015-10-16 MED ORDER — BOOST / RESOURCE BREEZE PO LIQD
1.0000 | Freq: Three times a day (TID) | ORAL | Status: DC
  Administered 2015-10-16 – 2015-10-17 (×3): 1 via ORAL

## 2015-10-16 NOTE — Progress Notes (Signed)
Subjective: She feels better and her speech is better. Dr. Freddie Apley help is noted and appreciated. She had not been started on any anticoagulation earlier because of falling but I agree that now that she's had significant issues from a stroke it's reasonable to try .  Objective: Vital signs in last 24 hours: Temp:  [97.8 F (36.6 C)-99.5 F (37.5 C)] 98 F (36.7 C) (11/06 0700) Pulse Rate:  [56-82] 68 (11/06 0700) Resp:  [16-18] 18 (11/06 0700) BP: (94-147)/(41-72) 138/72 mmHg (11/06 0700) SpO2:  [94 %-100 %] 94 % (11/06 0805) Weight:  [77.111 kg (170 lb)] 77.111 kg (170 lb) (11/06 0300) Weight change:  Last BM Date: 10/12/15  Intake/Output from previous day: 11/05 0701 - 11/06 0700 In: 480 [P.O.:480] Out: 950 [Urine:950]  PHYSICAL EXAM General appearance: alert, cooperative and Her speech is significantly better Resp: clear to auscultation bilaterally Cardio: irregularly irregular rhythm GI: soft, non-tender; bowel sounds normal; no masses,  no organomegaly Extremities: extremities normal, atraumatic, no cyanosis or edema  Lab Results:  Results for orders placed or performed during the hospital encounter of 10/13/15 (from the past 48 hour(s))  Urine culture     Status: None (Preliminary result)   Collection Time: 10/14/15  6:24 PM  Result Value Ref Range   Specimen Description URINE, RANDOM    Special Requests Normal    Culture      TOO YOUNG TO READ Performed at [facility]    Report Status PENDING   CBC     Status: None   Collection Time: 10/16/15  6:11 AM  Result Value Ref Range   WBC 9.0 4.0 - 10.5 K/uL   RBC 4.89 3.87 - 5.11 MIL/uL   Hemoglobin 13.9 12.0 - 15.0 g/dL   HCT 42.6 36.0 - 46.0 %   MCV 87.1 78.0 - 100.0 fL   MCH 28.4 26.0 - 34.0 pg   MCHC 32.6 30.0 - 36.0 g/dL   RDW 13.7 11.5 - 15.5 %   Platelets 183 150 - 400 K/uL    ABGS No results for input(s): PHART, PO2ART, TCO2, HCO3 in the last 72 hours.  Invalid input(s):  PCO2 CULTURES Recent Results (from the past 240 hour(s))  Urine culture     Status: None (Preliminary result)   Collection Time: 10/14/15  6:24 PM  Result Value Ref Range Status   Specimen Description URINE, RANDOM  Final   Special Requests Normal  Final   Culture   Final    TOO YOUNG TO READ Performed at [facility]    Report Status PENDING  Incomplete   Studies/Results: Dg Wrist Complete Left  10/14/2015  CLINICAL DATA:  CVA.  Fall.  Left first ray pain. EXAM: LEFT WRIST - COMPLETE 3+ VIEW COMPARISON:  None. FINDINGS: No fracture, dislocation or suspicious focal osseous lesion is seen in the left wrist. There is severe osteoarthritis at the first carpometacarpal joint. Severe fifth and moderate third metacarpophalangeal joint osteoarthritis. Moderate interphalangeal joint left thumb osteoarthritis. Tiny 4 mm metallic density foreign body in the soft tissues volar to the distal left second metacarpal. IMPRESSION: 1. No fracture or dislocation in the left wrist. 2. 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 3. Tiny 4 mm metallic density foreign body in the soft tissues volar to the distal left second metacarpal. Electronically Signed   By: Ilona Sorrel M.D.   On: 10/14/2015 20:34   Dg Hand Complete Left  10/14/2015  CLINICAL DATA:  Fall.  Left first ray pain. EXAM: LEFT HAND - COMPLETE 3+ VIEW COMPARISON:  None. FINDINGS: No fracture, dislocation or suspicious focal osseous lesion in the left wrist. Tiny 4 mm metallic density foreign body in the soft tissues volar to the distal left second metacarpal. Severe polyarticular erosive osteoarthritis throughout the left hand and wrist, most prominently involving the second through fifth distal interphalangeal joints and third and fourth proximal interphalangeal joints. IMPRESSION: 1. No fracture or dislocation in the left hand. 2. Severe polyarticular erosive osteoarthritis in the left hand as described. 3.  Tiny 4 mm metallic density foreign body in the soft tissues volar to the distal left second metacarpal. Electronically Signed   By: Ilona Sorrel M.D.   On: 10/14/2015 20:36    Medications:  Prior to Admission:  Prescriptions prior to admission  Medication Sig Dispense Refill Last Dose  . acetaminophen (TYLENOL) 650 MG CR tablet Take 650 mg by mouth 2 (two) times daily.   10/12/2015 at Unknown time  . ANORO ELLIPTA 62.5-25 MCG/INH AEPB Inhale 1 Inhaler into the lungs daily.    10/12/2015 at Unknown time  . aspirin 81 MG tablet Take 81 mg by mouth daily.   10/12/2015 at Unknown time  . cycloSPORINE (RESTASIS) 0.05 % ophthalmic emulsion Place 1 drop into both eyes every 12 (twelve) hours.    10/12/2015 at Unknown time  . diltiazem (CARDIZEM CD) 180 MG 24 hr capsule TAKE ONE CAPSULE BY MOUTH ONCE DAILY. 30 capsule 6 10/12/2015 at Unknown time  . fluticasone (FLONASE) 50 MCG/ACT nasal spray Place 2 sprays into the nose daily as needed for allergies.    unknown at Unknown time  . furosemide (LASIX) 20 MG tablet TAKE 1 TABLET BY MOUTH DAILY. 30 tablet 6 10/12/2015 at Unknown time  . Multiple Vitamin (MULITIVITAMIN WITH MINERALS) TABS Take 1 tablet by mouth. Takes 1 tab 2-3 times a week   10/12/2015 at Unknown time  . omeprazole (PRILOSEC) 20 MG capsule Take 20 mg by mouth daily.     10/12/2015 at Unknown time  . Polyethyl Glycol-Propyl Glycol (SYSTANE OP) Apply 1 drop to eye daily. For dry eyes   10/12/2015 at Unknown time  . polyethylene glycol powder (GLYCOLAX/MIRALAX) powder Take 17 g by mouth as needed for mild constipation.    unknown at Unknown time  . potassium chloride SA (K-DUR,KLOR-CON) 20 MEQ tablet TAKE ONE TABLET BY MOUTH EVERY OTHER DAY. TAKE ONE THE DAY YOU TAKE FUROSEMIDE. 30 tablet 3 unknown  . PROAIR HFA 108 (90 BASE) MCG/ACT inhaler 2 puffs 3 (three) times daily as needed for wheezing (cough).    unknown  . tiotropium (SPIRIVA) 18 MCG inhalation capsule Place 18 mcg into inhaler and inhale  daily.     10/12/2015 at Unknown time  . VOLTAREN 1 % GEL Apply 2 g topically 2 (two) times daily.    10/12/2015 at Unknown time   Scheduled: . acetaminophen  650 mg Oral 3 times per day  . apixaban  5 mg Oral BID  . aspirin  81 mg Oral Daily  . cefTRIAXone (ROCEPHIN)  IV  1 g Intravenous Q24H  . clopidogrel  75 mg Oral Daily  . cycloSPORINE  1 drop Both Eyes Q12H  . diclofenac sodium  2 g Topical BID  . diltiazem  180 mg Oral Daily  . feeding supplement  1 Container Oral TID BM  . fluticasone  2 spray Each Nare Daily  . furosemide  20 mg Oral Daily  . multivitamin with  minerals  1 tablet Oral Daily  . pantoprazole  40 mg Oral Daily  . potassium chloride SA  20 mEq Oral Daily  . sodium chloride  3 mL Intravenous Q12H  . tiotropium  18 mcg Inhalation Daily  . Umeclidinium-Vilanterol  1 Inhaler Inhalation Daily   Continuous:  [id number], ondansetron **OR** ondansetron (ZOFRAN) IV, polyethylene glycol  Assesment: She was admitted with stroke. She is better. She has chronic atrial fibrillation and will be chronically anticoagulated. Active Problems:   Essential hypertension, benign   Atrial fibrillation (H[facility])   Altered mental state   Elevated troponin    Plan: Discontinue IV fluids. Probable transfer to skilled care facility tomorrow    LOS: 3 days   Jackie Littlejohn L 10/16/2015, 10:58 AM

## 2015-10-17 ENCOUNTER — Inpatient Hospital Stay
Admission: RE | Admit: 2015-10-17 | Discharge: 2015-11-01 | Disposition: A | Discharge status: Home / Self Care | Payer: Medicare Other | Source: Ambulatory Visit | Attending: Pulmonary Disease | Admitting: Pulmonary Disease

## 2015-10-17 DIAGNOSIS — N390 Urinary tract infection, site not specified: Secondary | ICD-10-CM | POA: Diagnosis not present

## 2015-10-17 DIAGNOSIS — F22 Delusional disorders: Secondary | ICD-10-CM | POA: Diagnosis not present

## 2015-10-17 DIAGNOSIS — I6789 Other cerebrovascular disease: Secondary | ICD-10-CM | POA: Diagnosis not present

## 2015-10-17 DIAGNOSIS — J449 Chronic obstructive pulmonary disease, unspecified: Secondary | ICD-10-CM | POA: Diagnosis not present

## 2015-10-17 DIAGNOSIS — M6281 Muscle weakness (generalized): Secondary | ICD-10-CM | POA: Diagnosis not present

## 2015-10-17 DIAGNOSIS — I5032 Chronic diastolic (congestive) heart failure: Secondary | ICD-10-CM | POA: Diagnosis not present

## 2015-10-17 DIAGNOSIS — R1312 Dysphagia, oropharyngeal phase: Secondary | ICD-10-CM | POA: Diagnosis not present

## 2015-10-17 DIAGNOSIS — Z9181 History of falling: Secondary | ICD-10-CM | POA: Diagnosis not present

## 2015-10-17 DIAGNOSIS — R0602 Shortness of breath: Secondary | ICD-10-CM | POA: Diagnosis not present

## 2015-10-17 DIAGNOSIS — I639 Cerebral infarction, unspecified: Secondary | ICD-10-CM | POA: Diagnosis present

## 2015-10-17 DIAGNOSIS — I482 Chronic atrial fibrillation: Secondary | ICD-10-CM | POA: Diagnosis not present

## 2015-10-17 DIAGNOSIS — J302 Other seasonal allergic rhinitis: Secondary | ICD-10-CM | POA: Diagnosis not present

## 2015-10-17 DIAGNOSIS — R278 Other lack of coordination: Secondary | ICD-10-CM | POA: Diagnosis not present

## 2015-10-17 DIAGNOSIS — R4182 Altered mental status, unspecified: Secondary | ICD-10-CM | POA: Diagnosis not present

## 2015-10-17 DIAGNOSIS — R0902 Hypoxemia: Secondary | ICD-10-CM | POA: Diagnosis not present

## 2015-10-17 DIAGNOSIS — I4891 Unspecified atrial fibrillation: Secondary | ICD-10-CM | POA: Diagnosis not present

## 2015-10-17 DIAGNOSIS — I10 Essential (primary) hypertension: Secondary | ICD-10-CM | POA: Diagnosis not present

## 2015-10-17 DIAGNOSIS — R262 Difficulty in walking, not elsewhere classified: Secondary | ICD-10-CM | POA: Diagnosis not present

## 2015-10-17 LAB — LIPID PANEL
CHOL/HDL RATIO: 2.5 ratio
CHOLESTEROL: 134 mg/dL (ref 0–200)
HDL: 53 mg/dL (ref >40)
LDL CALC: 74 mg/dL (ref 0–99)
TRIGLYCERIDES: 37 mg/dL (ref <150)
VLDL: 7 mg/dL (ref 0–40)

## 2015-10-17 LAB — URINE CULTURE: SPECIAL REQUESTS: NORMAL

## 2015-10-17 LAB — BASIC METABOLIC PANEL
Anion gap: 7 (ref 5–15)
BUN: 15 mg/dL (ref 6–20)
CO2: 24 mmol/L (ref 22–32)
Calcium: 8.4 mg/dL — ABNORMAL LOW (ref 8.9–10.3)
Chloride: 109 mmol/L (ref 101–111)
Creatinine, Ser: 0.71 mg/dL (ref 0.44–1.00)
GFR calc Af Amer: >60 mL/min (ref >60)
GFR calc non Af Amer: >60 mL/min (ref >60)
Glucose, Bld: 110 mg/dL — ABNORMAL HIGH (ref 65–99)
Potassium: 3.6 mmol/L (ref 3.5–5.1)
Sodium: 140 mmol/L (ref 135–145)

## 2015-10-17 LAB — CBC
HEMATOCRIT: 42.1 % (ref 36.0–46.0)
Hemoglobin: 14.1 g/dL (ref 12.0–15.0)
MCH: 28.7 pg (ref 26.0–34.0)
MCHC: 33.5 g/dL (ref 30.0–36.0)
MCV: 85.6 fL (ref 78.0–100.0)
PLATELETS: 192 10*3/uL (ref 150–400)
RBC: 4.92 MIL/uL (ref 3.87–5.11)
RDW: 13.5 % (ref 11.5–15.5)
WBC: 9.3 10*3/uL (ref 4.0–10.5)

## 2015-10-17 MED ORDER — APIXABAN 5 MG PO TABS: 5.0000 mg | ORAL_TABLET | Freq: Two times a day (BID) | ORAL | Status: DC

## 2015-10-17 MED ORDER — ATORVASTATIN CALCIUM 40 MG PO TABS: 80.0000 mg | ORAL_TABLET | Freq: Every day | ORAL | Status: DC

## 2015-10-17 MED ORDER — BOOST / RESOURCE BREEZE PO LIQD: 1.0000 | Freq: Three times a day (TID) | ORAL | Status: DC

## 2015-10-17 MED ORDER — ATORVASTATIN CALCIUM 80 MG PO TABS: 80.0000 mg | ORAL_TABLET | Freq: Every day | ORAL | Status: DC

## 2015-10-17 MED ORDER — PNEUMOCOCCAL VAC POLYVALENT 25 MCG/0.5ML IJ INJ
0.5000 mL | INJECTION | INTRAMUSCULAR | Status: DC
  Filled 2015-10-17: qty 0.5

## 2015-10-17 MED ORDER — CEPHALEXIN 500 MG PO CAPS: 500.0000 mg | ORAL_CAPSULE | Freq: Three times a day (TID) | ORAL | Status: DC

## 2015-10-17 NOTE — NC FL2 (Signed)
[place] LEVEL OF CARE SCREENING TOOL     IDENTIFICATION  Patient Name: Wendy Perkins Birthdate: 03/21/1919 Sex: female Admission Date (Current Location): 10/13/2015  [facility] [place] Number:     Facility and Address:  [facility] [address], [place]      Provider Number: [id number]  Attending Physician Name and Address:  Sinda Du, MD  Relative Name and Phone Number:       Current Level of Care: Hospital Recommended Level of Care: [place] Prior Approval Number:    Date Approved/Denied:   PASRR Number: [id number]  Discharge Plan: SNF    Current Diagnoses: Patient Active Problem List   Diagnosis Date Noted  . Embolic stroke ([place]) 37/62/8315  . COPD (chronic obstructive pulmonary disease) ([place]) 10/17/2015  . UTI (urinary tract infection) 10/17/2015  . Altered mental state 10/13/2015  . Elevated troponin 10/13/2015  . Shingles 05/04/2015  . Rib pain on left side 11/15/2014  . Breast pain, left 11/15/2014  . Breast nodule 11/15/2014  . Orthostatic hypotension 09/30/2013  . Atrial fibrillation ([place]) 11/23/2011  . Chronic diastolic heart failure ([place]) 11/23/2011  . Essential hypertension, benign 07/13/2010  . DYSPNEA 11/15/2009    Orientation ACTIVITIES/SOCIAL BLADDER RESPIRATION    Self, Time, Situation, Place  Family supportive Continent Normal  BEHAVIORAL SYMPTOMS/MOOD NEUROLOGICAL BOWEL NUTRITION STATUS      Continent Diet (Regular)  PHYSICIAN VISITS COMMUNICATION OF NEEDS Height & Weight Skin    Verbally 5\' 2"  (157.5 cm) 170 lbs. Normal          AMBULATORY STATUS RESPIRATION    Supervision limited Normal      Personal Care Assistance Level of Assistance  Bathing, Feeding, Dressing Bathing Assistance: Limited assistance Feeding assistance: Limited assistance Dressing Assistance: Limited assistance      Functional Limitations Info  Sight, Hearing, Speech Sight Info:  Adequate Hearing Info: Impaired Speech Info: Adequate       SPECIAL CARE FACTORS FREQUENCY  PT (By licensed PT)     PT Frequency: 5             Additional Factors Info  Code Status, Allergies Code Status Info: Full code Allergies Info: Sulfur           Current Medications (10/17/2015): Current Facility-Administered Medications  Medication Dose Route Frequency Provider Last Rate Last Dose  . acetaminophen (TYLENOL) tablet 650 mg  650 mg Oral 3 times per day Doree Albee, MD   650 mg at 10/17/15 0600  . albuterol (PROVENTIL) (2.5 MG/3ML) 0.083% nebulizer solution 3 mL  3 mL Inhalation Q4H PRN Nimish C Gosrani, MD      . apixaban (ELIQUIS) tablet 5 mg  5 mg Oral BID Phillips Odor, MD   5 mg at 10/16/15 2200  . aspirin chewable tablet 81 mg  81 mg Oral Daily Nimish Luther Parody, MD   81 mg at 10/16/15 1202  . atorvastatin (LIPITOR) tablet 80 mg  80 mg Oral q1800 Sinda Du, MD      . cefTRIAXone (ROCEPHIN) 1 g in dextrose 5 % 50 mL IVPB  1 g Intravenous Q24H Doree Albee, MD   1 g at 10/16/15 2100  . cycloSPORINE (RESTASIS) 0.05 % ophthalmic emulsion 1 drop  1 drop Both Eyes Q12H Nimish C Gosrani, MD   1 drop at 10/16/15 2200  . diclofenac sodium (VOLTAREN) 1 % transdermal gel 2 g  2 g Topical BID Doree Albee, MD  2 g at 10/16/15 2200  . diltiazem (CARDIZEM CD) 24 hr capsule 180 mg  180 mg Oral Daily Nimish C Anastasio Champion, MD   180 mg at 10/16/15 1201  . feeding supplement (BOOST / RESOURCE BREEZE) liquid 1 Container  1 Container Oral TID BM Sinda Du, MD   1 Container at 10/16/15 2000  . fluticasone (FLONASE) 50 MCG/ACT nasal spray 2 spray  2 spray Each Nare Daily Nimish Luther Parody, MD   2 spray at 10/16/15 1200  . furosemide (LASIX) tablet 20 mg  20 mg Oral Daily Nimish C Anastasio Champion, MD   20 mg at 10/16/15 1201  . multivitamin with minerals tablet 1 tablet  1 tablet Oral Daily Doree Albee, MD   1 tablet at 10/16/15 1202  . ondansetron (ZOFRAN) tablet 4 mg  4 mg  Oral Q6H PRN Nimish C Gosrani, MD       Or  . ondansetron (ZOFRAN) injection 4 mg  4 mg Intravenous Q6H PRN Nimish C Gosrani, MD      . pantoprazole (PROTONIX) EC tablet 40 mg  40 mg Oral Daily Nimish C Anastasio Champion, MD   40 mg at 10/16/15 1201  . polyethylene glycol (MIRALAX / GLYCOLAX) packet 17 g  17 g Oral Daily PRN Nimish C Gosrani, MD      . potassium chloride SA (K-DUR,KLOR-CON) CR tablet 20 mEq  20 mEq Oral Daily Nimish C Gosrani, MD   20 mEq at 10/16/15 1200  . sodium chloride 0.9 % injection 3 mL  3 mL Intravenous Q12H Nimish C Gosrani, MD   3 mL at 10/16/15 2200  . tiotropium (SPIRIVA) inhalation capsule 18 mcg  18 mcg Inhalation Daily Doree Albee, MD   18 mcg at 10/17/15 0736  . Umeclidinium-Vilanterol 62.5-25 MCG/INH AEPB 1 Inhaler  1 Inhaler Inhalation Daily Doree Albee, MD   1 Inhaler at 10/14/15 1159   Do not use this list as official medication orders. Please verify with discharge summary.  Discharge Medications:   Medication List    STOP taking these medications        tiotropium 18 MCG inhalation capsule  Commonly known as:  SPIRIVA      TAKE these medications        acetaminophen 650 MG CR tablet  Commonly known as:  TYLENOL  Take 650 mg by mouth 2 (two) times daily.     ANORO ELLIPTA 62.5-25 MCG/INH Aepb  Generic drug:  Umeclidinium-Vilanterol  Inhale 1 Inhaler into the lungs daily.     apixaban 5 MG Tabs tablet  Commonly known as:  ELIQUIS  Take 1 tablet (5 mg total) by mouth 2 (two) times daily.     aspirin 81 MG tablet  Take 81 mg by mouth daily.     atorvastatin 80 MG tablet  Commonly known as:  LIPITOR  Take 1 tablet (80 mg total) by mouth daily at 6 PM.     cephALEXin 500 MG capsule  Commonly known as:  KEFLEX  Take 1 capsule (500 mg total) by mouth 3 (three) times daily.     cycloSPORINE 0.05 % ophthalmic emulsion  Commonly known as:  RESTASIS  Place 1 drop into both eyes every 12 (twelve) hours.     diltiazem 180 MG 24 hr capsule   Commonly known as:  CARDIZEM CD  TAKE ONE CAPSULE BY MOUTH ONCE DAILY.     feeding supplement Liqd  Take 1 Container by mouth 3 (three) times daily between meals.  fluticasone 50 MCG/ACT nasal spray  Commonly known as:  FLONASE  Place 2 sprays into the nose daily as needed for allergies.     furosemide 20 MG tablet  Commonly known as:  LASIX  TAKE 1 TABLET BY MOUTH DAILY.     multivitamin with minerals Tabs tablet  Take 1 tablet by mouth. Takes 1 tab 2-3 times a week     omeprazole 20 MG capsule  Commonly known as:  PRILOSEC  Take 20 mg by mouth daily.     polyethylene glycol powder powder  Commonly known as:  GLYCOLAX/MIRALAX  Take 17 g by mouth as needed for mild constipation.     potassium chloride SA 20 MEQ tablet  Commonly known as:  K-DUR,KLOR-CON  TAKE ONE TABLET BY MOUTH EVERY OTHER DAY. TAKE ONE THE DAY YOU TAKE FUROSEMIDE.     PROAIR HFA 108 (90 BASE) MCG/ACT inhaler  Generic drug:  albuterol  2 puffs 3 (three) times daily as needed for wheezing (cough).     SYSTANE OP  Apply 1 drop to eye daily. For dry eyes     VOLTAREN 1 % Gel  Generic drug:  diclofenac sodium  Apply 2 g topically 2 (two) times daily.        Relevant Imaging Results:  Relevant Lab Results:  Recent Labs    Additional Information    [name], [place]

## 2015-10-17 NOTE — Clinical Social Work Note (Signed)
Clinical Social Work Assessment  Patient Details  Name: Wendy Perkins MRN: [id number] Date of Birth: 04-Oct-1919  Date of referral:  10/17/15               Reason for consult:  Discharge Planning                Permission sought to share information with:  Family Supports Permission granted to share information::  Yes, Verbal Permission Granted  Name::     children  Agency::     Relationship::     Contact Information:     Housing/Transportation Living arrangements for the past 2 months:  Single Family Home Source of Information:  Patient, Adult Children Patient Interpreter Needed:  None Criminal Activity/Legal Involvement Pertinent to Current Situation/Hospitalization:  No - Comment as needed Significant Relationships:  Adult Children Lives with:  Self Do you feel safe going back to the place where you live?  No Need for family participation in patient care:  Yes (Comment)  Care giving concerns:  Pt lives alone.    Social Worker assessment / plan:  CSW met with pt and three children at bedside. Pt alert and oriented and reports she lives alone and is very independent at baseline. Pt ambulates with a walker and still drives. Her sons live nearby as well as a granddaughter who lives next door. Pt admitted due to stroke. PT evaluated pt this weekend and recommend SNF. CSW discussed placement process and family shares that she went to SNF several years ago after breaking both wrists. Pt states, "I will do whatever I need to" when asked about feelings regarding placement. Pt and family intend for this to be short term. They request private duty care list to plan for return home after SNF. CM notified.   Employment status:  Retired Nurse, adult PT Recommendations:  [place] / Referral to community resources:  [place]  Patient/Family's Response to care:  Pt and family agreeable to short term SNF.   Patient/Family's  Understanding of and Emotional Response to Diagnosis, Current Treatment, and Prognosis:  Pt and family aware of admission diagnosis and understand that pt has been d/c.   Emotional Assessment Appearance:  Appears stated age Attitude/Demeanor/Rapport:  Other (Cooperative) Affect (typically observed):  Accepting, Pleasant Orientation:  Oriented to Self, Oriented to Place, Oriented to  Time, Oriented to Situation Alcohol / Substance use:  Not Applicable Psych involvement (Current and /or in the community):  No (Comment)  Discharge Needs  Concerns to be addressed:  Discharge Planning Concerns Readmission within the last 30 days:  No Current discharge risk:  Lives alone Barriers to Discharge:  Continued Medical Work up   [employer], [employer], [place] 10/17/2015, 9:01 AM [phone]

## 2015-10-17 NOTE — Clinical Social Work Placement (Signed)
   CLINICAL SOCIAL WORK PLACEMENT  NOTE  Date:  10/17/2015  Patient Details  Name: Wendy Perkins MRN: [id number] Date of Birth: [birth year]  Clinical Social Work is seeking post-discharge placement for this patient at the [place] level of care (*CSW will initial, date and re-position this form in  chart as items are completed):  Yes   Patient/family provided with [place] Work Department's list of facilities offering this level of care within the geographic area requested by the patient (or if unable, by the patient's family).  Yes   Patient/family informed of their freedom to choose among providers that offer the needed level of care, that participate in Medicare, Medicaid or managed care program needed by the patient, have an available bed and are willing to accept the patient.  Yes   Patient/family informed of [facility]'s ownership interest in [facility] and [facility], as well as of the fact that they are under no obligation to receive care at these facilities.  PASRR submitted to EDS on       PASRR number received on       Existing PASRR number confirmed on 10/17/15     FL2 transmitted to all facilities in geographic area requested by pt/family on 10/17/15     FL2 transmitted to all facilities within larger geographic area on       Patient informed that his/her managed care company has contracts with or will negotiate with certain facilities, including the following:        Yes   Patient/family informed of bed offers received.  Patient chooses bed at [facility]     Physician recommends and patient chooses bed at      Patient to be transferred to [facility] on 10/17/15.  Patient to be transferred to facility by staff     Patient family notified on 10/17/15 of transfer.  Name of family member notified:  Jenny Reichmann- son     PHYSICIAN       Additional Comment:     _______________________________________________ [name], LCSW 10/17/2015, 1:50 PM [phone]

## 2015-10-17 NOTE — Progress Notes (Signed)
Subjective: She feels better. She has no new complaints.  Objective: Vital signs in last 24 hours: Temp:  [98 F (36.7 C)-98.9 F (37.2 C)] 98 F (36.7 C) (11/07 0546) Pulse Rate:  [65-76] 72 (11/07 0546) Resp:  [18] 18 (11/07 0546) BP: (136-161)/(43-72) 153/71 mmHg (11/07 0546) SpO2:  [94 %-97 %] 97 % (11/07 0736) Weight change:  Last BM Date: 10/12/15  Intake/Output from previous day: 11/06 0701 - 11/07 0700 In: 720 [P.O.:720] Out: -   PHYSICAL EXAM General appearance: alert, cooperative and no distress Resp: clear to auscultation bilaterally Cardio: irregularly irregular rhythm GI: soft, non-tender; bowel sounds normal; no masses,  no organomegaly Extremities: extremities normal, atraumatic, no cyanosis or edema  Lab Results:  Results for orders placed or performed during the hospital encounter of 10/13/15 (from the past 48 hour(s))  CBC     Status: None   Collection Time: 10/16/15  6:11 AM  Result Value Ref Range   WBC 9.0 4.0 - 10.5 K/uL   RBC 4.89 3.87 - 5.11 MIL/uL   Hemoglobin 13.9 12.0 - 15.0 g/dL   HCT 42.6 36.0 - 46.0 %   MCV 87.1 78.0 - 100.0 fL   MCH 28.4 26.0 - 34.0 pg   MCHC 32.6 30.0 - 36.0 g/dL   RDW 13.7 11.5 - 15.5 %   Platelets 183 150 - 400 K/uL  Lipid panel     Status: None   Collection Time: 10/16/15  6:11 AM  Result Value Ref Range   Cholesterol 138 0 - 200 mg/dL   Triglycerides 43 <150 mg/dL   HDL 52 >40 mg/dL   Total CHOL/HDL Ratio 2.7 RATIO   VLDL 9 0 - 40 mg/dL   LDL Cholesterol 77 0 - 99 mg/dL    Comment:        Total Cholesterol/HDL:CHD Risk Coronary Heart Disease Risk Table                     Men   Women  1/2 Average Risk   3.4   3.3  Average Risk       5.0   4.4  2 X Average Risk   9.6   7.1  3 X Average Risk  23.4   11.0        Use the calculated Patient Ratio above and the CHD Risk Table to determine the patient's CHD Risk.        ATP III CLASSIFICATION (LDL):  <100     mg/dL   Optimal  100-129  mg/dL   Near or  Above                    Optimal  130-159  mg/dL   Borderline  160-189  mg/dL   High  >190     mg/dL   Very High   CBC     Status: None   Collection Time: 10/17/15  6:30 AM  Result Value Ref Range   WBC 9.3 4.0 - 10.5 K/uL   RBC 4.92 3.87 - 5.11 MIL/uL   Hemoglobin 14.1 12.0 - 15.0 g/dL   HCT 42.1 36.0 - 46.0 %   MCV 85.6 78.0 - 100.0 fL   MCH 28.7 26.0 - 34.0 pg   MCHC 33.5 30.0 - 36.0 g/dL   RDW 13.5 11.5 - 15.5 %   Platelets 192 150 - 400 K/uL  Basic metabolic panel     Status: Abnormal   Collection Time: 10/17/15  6:30 AM  Result Value Ref Range   Sodium 140 135 - 145 mmol/L   Potassium 3.6 3.5 - 5.1 mmol/L   Chloride 109 101 - 111 mmol/L   CO2 24 22 - 32 mmol/L   Glucose, Bld 110 (H) 65 - 99 mg/dL   BUN 15 6 - 20 mg/dL   Creatinine, Ser 0.71 0.44 - 1.00 mg/dL   Calcium 8.4 (L) 8.9 - 10.3 mg/dL   GFR calc non Af Amer >60 >60 mL/min   GFR calc Af Amer >60 >60 mL/min    Comment: (NOTE) The eGFR has been calculated using the CKD EPI equation. This calculation has not been validated in all clinical situations. eGFR's persistently <60 mL/min signify possible Chronic Kidney Disease.    Anion gap 7 5 - 15    ABGS No results for input(s): PHART, PO2ART, TCO2, HCO3 in the last 72 hours.  Invalid input(s): PCO2 CULTURES Recent Results (from the past 240 hour(s))  Urine culture     Status: None (Preliminary result)   Collection Time: 10/14/15  6:24 PM  Result Value Ref Range Status   Specimen Description URINE, RANDOM  Final   Special Requests Normal  Final   Culture   Final    TOO YOUNG TO READ Performed at [facility]    Report Status PENDING  Incomplete   Studies/Results: US Carotid Bilateral  10/16/2015  CLINICAL DATA:  CVA. History of hypertension, stroke and hyperlipidemia. EXAM: BILATERAL CAROTID DUPLEX ULTRASOUND TECHNIQUE: Pearline Cables scale imaging, color Doppler and duplex ultrasound were performed of bilateral carotid and vertebral arteries in the neck.  COMPARISON:  Carotid Doppler ultrasound- 11/21/2009 FINDINGS: Criteria: Quantification of carotid stenosis is based on velocity parameters that correlate the residual internal carotid diameter with NASCET-based stenosis levels, using the diameter of the distal internal carotid lumen as the denominator for stenosis measurement. The following velocity measurements were obtained: RIGHT ICA:  155/21 cm/sec CCA:  00/71 cm/sec SYSTOLIC ICA/CCA RATIO:  1.8 DIASTOLIC ICA/CCA RATIO:  2.0 ECA:  171 cm/sec LEFT ICA:  156/18 cm/sec CCA:  21/97 cm/sec SYSTOLIC ICA/CCA RATIO:  1.7 DIASTOLIC ICA/CCA RATIO:  1.8 ECA:  152 cm/sec RIGHT CAROTID ARTERY: There is a minimal amount of scattered eccentric mixed echogenic plaque throughout the right common carotid artery (representative images 3 and 7). There is a moderate to large amount of eccentric mixed echogenic plaque within the right carotid bulb (images 14 and 16), extending to involve the origin and proximal aspect of the right internal carotid artery (image 24) progressed since the 11/2009 examination) resulting in elevated peak systolic velocities throughout the interrogated course of the right internal carotid artery (greatest acquired peak systolic velocity within the mid aspect the right internal carotid artery measures 155 cm/sec - image 29). RIGHT VERTEBRAL ARTERY: Not visualized - note, the right vertebral artery was noted to be atretic on the 11/2009 examination. LEFT CAROTID ARTERY: There is a moderate to large amount of eccentric mixed echogenic plaque throughout the left common carotid artery (representative images 36 and 41). There is a moderate to large amount of eccentric mixed echogenic plaque within the left carotid bulb (image 50), extending to involve the origin and proximal aspect the left internal carotid artery (image 58), likely progressed since the 11/2009 examination though now resulting in elevated peak systolic velocities within the proximal aspect of  the left internal carotid artery (greatest acquired peak systolic velocity within the proximal ICA measures 156 cm/sec - image 60). LEFT VERTEBRAL ARTERY:  Antegrade flow IMPRESSION: 1.  Interval progression of bilateral atherosclerotic plaque resulting in elevated peak systolic velocities within the bilateral internal carotid arteries compatible with the 50-69% luminal narrowing range bilaterally. Further evaluation with CTA could be performed as clinically indicated. 2. Non visualization of a patent right vertebral artery however note the right vertebral artery was noted to be atretic on the 11/2009 examination. Again, this could be further evaluated at the time of CTA. Electronically Signed   By: Sandi Mariscal M.D.   On: 10/16/2015 12:31    Medications:  Prior to Admission:  Prescriptions prior to admission  Medication Sig Dispense Refill Last Dose  . acetaminophen (TYLENOL) 650 MG CR tablet Take 650 mg by mouth 2 (two) times daily.   10/12/2015 at Unknown time  . ANORO ELLIPTA 62.5-25 MCG/INH AEPB Inhale 1 Inhaler into the lungs daily.    10/12/2015 at Unknown time  . aspirin 81 MG tablet Take 81 mg by mouth daily.   10/12/2015 at Unknown time  . cycloSPORINE (RESTASIS) 0.05 % ophthalmic emulsion Place 1 drop into both eyes every 12 (twelve) hours.    10/12/2015 at Unknown time  . diltiazem (CARDIZEM CD) 180 MG 24 hr capsule TAKE ONE CAPSULE BY MOUTH ONCE DAILY. 30 capsule 6 10/12/2015 at Unknown time  . fluticasone (FLONASE) 50 MCG/ACT nasal spray Place 2 sprays into the nose daily as needed for allergies.    unknown at Unknown time  . furosemide (LASIX) 20 MG tablet TAKE 1 TABLET BY MOUTH DAILY. 30 tablet 6 10/12/2015 at Unknown time  . Multiple Vitamin (MULITIVITAMIN WITH MINERALS) TABS Take 1 tablet by mouth. Takes 1 tab 2-3 times a week   10/12/2015 at Unknown time  . omeprazole (PRILOSEC) 20 MG capsule Take 20 mg by mouth daily.     10/12/2015 at Unknown time  . Polyethyl Glycol-Propyl Glycol (SYSTANE  OP) Apply 1 drop to eye daily. For dry eyes   10/12/2015 at Unknown time  . polyethylene glycol powder (GLYCOLAX/MIRALAX) powder Take 17 g by mouth as needed for mild constipation.    unknown at Unknown time  . potassium chloride SA (K-DUR,KLOR-CON) 20 MEQ tablet TAKE ONE TABLET BY MOUTH EVERY OTHER DAY. TAKE ONE THE DAY YOU TAKE FUROSEMIDE. 30 tablet 3 unknown  . PROAIR HFA 108 (90 BASE) MCG/ACT inhaler 2 puffs 3 (three) times daily as needed for wheezing (cough).    unknown  . tiotropium (SPIRIVA) 18 MCG inhalation capsule Place 18 mcg into inhaler and inhale daily.     10/12/2015 at Unknown time  . VOLTAREN 1 % GEL Apply 2 g topically 2 (two) times daily.    10/12/2015 at Unknown time   Scheduled: . acetaminophen  650 mg Oral 3 times per day  . apixaban  5 mg Oral BID  . aspirin  81 mg Oral Daily  . cefTRIAXone (ROCEPHIN)  IV  1 g Intravenous Q24H  . clopidogrel  75 mg Oral Daily  . cycloSPORINE  1 drop Both Eyes Q12H  . diclofenac sodium  2 g Topical BID  . diltiazem  180 mg Oral Daily  . feeding supplement  1 Container Oral TID BM  . fluticasone  2 spray Each Nare Daily  . furosemide  20 mg Oral Daily  . multivitamin with minerals  1 tablet Oral Daily  . pantoprazole  40 mg Oral Daily  . potassium chloride SA  20 mEq Oral Daily  . sodium chloride  3 mL Intravenous Q12H  . tiotropium  18 mcg Inhalation Daily  .  Umeclidinium-Vilanterol  1 Inhaler Inhalation Daily   Continuous:  THS:[id number], ondansetron **OR** ondansetron (ZOFRAN) IV, polyethylene glycol  Assesment: She was admitted with embolic stroke. Initially she had some changes in her face and had fallen. Her speech was markedly altered. She is still very weak. Her mental status was altered and that is better. She had elevated troponin that appears to be related to her illness rather than to acute cardiac event. She has chronic atrial fibrillation and previously had been off anticoagulation because of multiple falls with injury.  However since she is going to rehabilitation and she is going to have more help at home I think it's safe for now. She is severely deconditioned and weak and skilled care facility placement was recommended and hopefully that can be accomplished same Active Problems:   Essential hypertension, benign   Atrial fibrillation (HCC)   Altered mental state   Elevated troponin    Plan: To skilled care facility. Continue PT anticoagulation etc.    LOS: 4 days   Salisha Bardsley L 10/17/2015, 8:00 AM

## 2015-10-17 NOTE — Clinical Social Work Placement (Signed)
   CLINICAL SOCIAL WORK PLACEMENT  NOTE  Date:  10/17/2015  Patient Details  Name: Wendy Perkins MRN: [id number] Date of Birth: Mar 31, 1919  Clinical Social Work is seeking post-discharge placement for this patient at the [place] level of care (*CSW will initial, date and re-position this form in  chart as items are completed):  Yes   Patient/family provided with [place] Work Department's list of facilities offering this level of care within the geographic area requested by the patient (or if unable, by the patient's family).  Yes   Patient/family informed of their freedom to choose among providers that offer the needed level of care, that participate in Medicare, Medicaid or managed care program needed by the patient, have an available bed and are willing to accept the patient.  Yes   Patient/family informed of [place]'s ownership interest in [facility] and [facility], as well as of the fact that they are under no obligation to receive care at these facilities.  PASRR submitted to EDS on       PASRR number received on       Existing PASRR number confirmed on 10/17/15     FL2 transmitted to all facilities in geographic area requested by pt/family on 10/17/15     FL2 transmitted to all facilities within larger geographic area on       Patient informed that his/her managed care company has contracts with or will negotiate with certain facilities, including the following:            Patient/family informed of bed offers received.  Patient chooses bed at       Physician recommends and patient chooses bed at      Patient to be transferred to   on  .  Patient to be transferred to facility by       Patient family notified on   of transfer.  Name of family member notified:        PHYSICIAN       Additional Comment:    _______________________________________________ [name], [place] 10/17/2015, 8:58  AM [phone]

## 2015-10-17 NOTE — Discharge Summary (Signed)
Physician Discharge Summary  Patient ID: Wendy Perkins MRN: [id number] DOB/AGE: [birth year] 81 y.o. Primary Care Physician:Ameshia Pewitt L, MD Admit date: 10/13/2015 Discharge date: 10/17/2015    Discharge Diagnoses:   Active Problems:   Essential hypertension, benign   Atrial fibrillation (HCC)   Altered mental state   Elevated troponin   Embolic stroke (HCC)   COPD (chronic obstructive pulmonary disease) (HCC)   UTI (urinary tract infection)  hypertension    Medication List    STOP taking these medications        tiotropium 18 MCG inhalation capsule  Commonly known as:  SPIRIVA      TAKE these medications        acetaminophen 650 MG CR tablet  Commonly known as:  TYLENOL  Take 650 mg by mouth 2 (two) times daily.     ANORO ELLIPTA 62.5-25 MCG/INH Aepb  Generic drug:  Umeclidinium-Vilanterol  Inhale 1 Inhaler into the lungs daily.     apixaban 5 MG Tabs tablet  Commonly known as:  ELIQUIS  Take 1 tablet (5 mg total) by mouth 2 (two) times daily.     aspirin 81 MG tablet  Take 81 mg by mouth daily.     atorvastatin 80 MG tablet  Commonly known as:  LIPITOR  Take 1 tablet (80 mg total) by mouth daily at 6 PM.     cephALEXin 500 MG capsule  Commonly known as:  KEFLEX  Take 1 capsule (500 mg total) by mouth 3 (three) times daily.     cycloSPORINE 0.05 % ophthalmic emulsion  Commonly known as:  RESTASIS  Place 1 drop into both eyes every 12 (twelve) hours.     diltiazem 180 MG 24 hr capsule  Commonly known as:  CARDIZEM CD  TAKE ONE CAPSULE BY MOUTH ONCE DAILY.     feeding supplement Liqd  Take 1 Container by mouth 3 (three) times daily between meals.     fluticasone 50 MCG/ACT nasal spray  Commonly known as:  FLONASE  Place 2 sprays into the nose daily as needed for allergies.     furosemide 20 MG tablet  Commonly known as:  LASIX  TAKE 1 TABLET BY MOUTH DAILY.     multivitamin with minerals Tabs tablet  Take 1 tablet by mouth. Takes 1 tab 2-3  times a week     omeprazole 20 MG capsule  Commonly known as:  PRILOSEC  Take 20 mg by mouth daily.     polyethylene glycol powder powder  Commonly known as:  GLYCOLAX/MIRALAX  Take 17 g by mouth as needed for mild constipation.     potassium chloride SA 20 MEQ tablet  Commonly known as:  K-DUR,KLOR-CON  TAKE ONE TABLET BY MOUTH EVERY OTHER DAY. TAKE ONE THE DAY YOU TAKE FUROSEMIDE.     PROAIR HFA 108 (90 BASE) MCG/ACT inhaler  Generic drug:  albuterol  2 puffs 3 (three) times daily as needed for wheezing (cough).     SYSTANE OP  Apply 1 drop to eye daily. For dry eyes     VOLTAREN 1 % Gel  Generic drug:  diclofenac sodium  Apply 2 g topically 2 (two) times daily.        Discharged Condition: Improved    Consults: Neurology  Significant Diagnostic Studies: Dg Thoracic Spine 2 View  10/13/2015  CLINICAL DATA:  Initial encounter for Pt fell today/pain back EXAM: THORACIC SPINE 2 VIEWS COMPARISON:  Chest radiograph of 08/25/2013 FINDINGS: S-shaped thoracic spine curvature. Cardiomegaly.  Atherosclerosis in the transverse aorta. Swimmer's view suboptimal with the cervical thoracic junction and upper thoracic spine not well evaluated. The first lateral view images approximately from the top of T2 through the bottom of T12. Spondylosis, with maintenance of vertebral body height across these levels. IMPRESSION: Suboptimal evaluation of the cervicothoracic junction and upper most thoracic spine. Given this factor, spondylosis but no convincing evidence of acute superimposed process. Electronically Signed   By: Abigail Miyamoto M.D.   On: 10/13/2015 15:11   Dg Lumbar Spine Complete  10/13/2015  CLINICAL DATA:  Initial encounter for Pt fell today/pain back EXAM: LUMBAR SPINE - COMPLETE 4+ VIEW COMPARISON:  Lumbar spine MRI of 04/25/2012 FINDINGS: Diminutive right twelfth rib. Given this nomenclature, 5 lumbar type vertebral bodies with mild to moderate convex left curvature. Aortic and branch  vessel atherosclerosis. Osteopenia. Lateral view mildly degraded by the extent of spinal curvature and osteopenia. Given this factor, vertebral body height alignment are maintained. Advanced facet arthropathy throughout the lower lumbar spine. IMPRESSION: Advanced spondylosis, without acute osseous finding. Electronically Signed   By: Abigail Miyamoto M.D.   On: 10/13/2015 15:08   Dg Wrist Complete Left  10/14/2015  CLINICAL DATA:  CVA.  Fall.  Left first ray pain. EXAM: LEFT WRIST - COMPLETE 3+ VIEW COMPARISON:  None. FINDINGS: No fracture, dislocation or suspicious focal osseous lesion is seen in the left wrist. There is severe osteoarthritis at the first carpometacarpal joint. Severe fifth and moderate third metacarpophalangeal joint osteoarthritis. Moderate interphalangeal joint left thumb osteoarthritis. Tiny 4 mm metallic density foreign body in the soft tissues volar to the distal left second metacarpal. IMPRESSION: 1. No fracture or dislocation in the left wrist. 2. 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 3. Tiny 4 mm metallic density foreign body in the soft tissues volar to the distal left second metacarpal. Electronically Signed   By: Ilona Sorrel M.D.   On: 10/14/2015 20:34   Ct Head Wo Contrast  10/13/2015  CLINICAL DATA:  Fall, altered mental status EXAM: CT HEAD WITHOUT CONTRAST TECHNIQUE: Contiguous axial images were obtained from the base of the skull through the vertex without intravenous contrast. COMPARISON:  05/27/2012 FINDINGS: No skull fracture is noted. There is scalp swelling and mild subcutaneous stranding midline posterior parietal scalp axial image 27. No intracranial hemorrhage, mass effect or midline shift. Mild cerebral atrophy. Mild periventricular white matter decreased attenuation probable due to chronic small vessel ischemic changes. No acute cortical infarction. No mass lesion is noted on this unenhanced scan. IMPRESSION: No acute  intracranial abnormality. Stable cerebral atrophy. Mild scalp swelling and subcutaneous stranding midline posterior parietal region please see axial image 27. Electronically Signed   By: Lahoma Crocker M.D.   On: 10/13/2015 13:34   Mr Brain Wo Contrast  10/14/2015  CLINICAL DATA:  Altered mental status. EXAM: MRI HEAD WITHOUT CONTRAST TECHNIQUE: Multiplanar, multiecho pulse sequences of the brain and surrounding structures were obtained without intravenous contrast. COMPARISON:  CT head without contrast 10/13/2015. FINDINGS: The diffusion-weighted images demonstrate an acute nonhemorrhagic infarct involving the left caudate. There additional cortical foci of acute nonhemorrhagic infarct involving the left frontal operculum and left parietal lobe. T2 changes are associated with the areas of acute infarction. There is no associated hemorrhage. Additional extensive periventricular white matter changes are evident bilaterally. Remote cortical infarcts are present in the right parietal lobe as well. White matter changes extend into the brainstem. The cerebellum is unremarkable. Abnormal signal in the right  vertebral artery is compatible with occlusion. The internal carotid arteries are patent bilaterally. The left vertebral artery is patent. The basilar artery is patent. Bilateral lens replacements are present. The globes orbits are otherwise within normal limits. The paranasal sinuses and mastoid air cells are clear. The skullbase is within normal limits. Midline structures demonstrate prominent soft tissue about the dens. The foramen magnum is patent. Midline structures are otherwise unremarkable. IMPRESSION: 1. Acute nonhemorrhagic infarct involving the left caudate. 2. Multiple scattered cortical acute nonhemorrhagic infarcts within the left MCA territory. These involve the left frontal operculum and left parietal lobe. 3. Remote cortical infarcts of the right parietal lobe. 4. Extensive atrophy and white matter  disease compatible with a history of chronic microvascular ischemia. 5. Occlusion of the right vertebral artery. These results were called by telephone at the time of interpretation on 10/14/2015 at 8:55 am to Dr. Luan Pulling, who verbally acknowledged these results. Electronically Signed   By: San Morelle M.D.   On: 10/14/2015 08:54   US Carotid Bilateral  10/16/2015  CLINICAL DATA:  CVA. History of hypertension, stroke and hyperlipidemia. EXAM: BILATERAL CAROTID DUPLEX ULTRASOUND TECHNIQUE: Pearline Cables scale imaging, color Doppler and duplex ultrasound were performed of bilateral carotid and vertebral arteries in the neck. COMPARISON:  Carotid Doppler ultrasound- 11/21/2009 FINDINGS: Criteria: Quantification of carotid stenosis is based on velocity parameters that correlate the residual internal carotid diameter with NASCET-based stenosis levels, using the diameter of the distal internal carotid lumen as the denominator for stenosis measurement. The following velocity measurements were obtained: RIGHT ICA:  155/21 cm/sec CCA:  66/06 cm/sec SYSTOLIC ICA/CCA RATIO:  1.8 DIASTOLIC ICA/CCA RATIO:  2.0 ECA:  171 cm/sec LEFT ICA:  156/18 cm/sec CCA:  30/16 cm/sec SYSTOLIC ICA/CCA RATIO:  1.7 DIASTOLIC ICA/CCA RATIO:  1.8 ECA:  152 cm/sec RIGHT CAROTID ARTERY: There is a minimal amount of scattered eccentric mixed echogenic plaque throughout the right common carotid artery (representative images 3 and 7). There is a moderate to large amount of eccentric mixed echogenic plaque within the right carotid bulb (images 14 and 16), extending to involve the origin and proximal aspect of the right internal carotid artery (image 24) progressed since the 11/2009 examination) resulting in elevated peak systolic velocities throughout the interrogated course of the right internal carotid artery (greatest acquired peak systolic velocity within the mid aspect the right internal carotid artery measures 155 cm/sec - image 29). RIGHT  VERTEBRAL ARTERY: Not visualized - note, the right vertebral artery was noted to be atretic on the 11/2009 examination. LEFT CAROTID ARTERY: There is a moderate to large amount of eccentric mixed echogenic plaque throughout the left common carotid artery (representative images 36 and 41). There is a moderate to large amount of eccentric mixed echogenic plaque within the left carotid bulb (image 50), extending to involve the origin and proximal aspect the left internal carotid artery (image 58), likely progressed since the 11/2009 examination though now resulting in elevated peak systolic velocities within the proximal aspect of the left internal carotid artery (greatest acquired peak systolic velocity within the proximal ICA measures 156 cm/sec - image 60). LEFT VERTEBRAL ARTERY:  Antegrade flow IMPRESSION: 1. Interval progression of bilateral atherosclerotic plaque resulting in elevated peak systolic velocities within the bilateral internal carotid arteries compatible with the 50-69% luminal narrowing range bilaterally. Further evaluation with CTA could be performed as clinically indicated. 2. Non visualization of a patent right vertebral artery however note the right vertebral artery was noted to be atretic on the 11/2009  examination. Again, this could be further evaluated at the time of CTA. Electronically Signed   By: Sandi Mariscal M.D.   On: 10/16/2015 12:31   Dg Hand Complete Left  10/14/2015  CLINICAL DATA:  Fall.  Left first ray pain. EXAM: LEFT HAND - COMPLETE 3+ VIEW COMPARISON:  None. FINDINGS: No fracture, dislocation or suspicious focal osseous lesion in the left wrist. Tiny 4 mm metallic density foreign body in the soft tissues volar to the distal left second metacarpal. Severe polyarticular erosive osteoarthritis throughout the left hand and wrist, most prominently involving the second through fifth distal interphalangeal joints and third and fourth proximal interphalangeal joints. IMPRESSION: 1. No  fracture or dislocation in the left hand. 2. Severe polyarticular erosive osteoarthritis in the left hand as described. 3. Tiny 4 mm metallic density foreign body in the soft tissues volar to the distal left second metacarpal. Electronically Signed   By: Ilona Sorrel M.D.   On: 10/14/2015 20:36    Lab Results: Basic Metabolic Panel:  Recent Labs  10/17/15 0630  NA 140  K 3.6  CL 109  CO2 24  GLUCOSE 110*  BUN 15  CREATININE 0.71  CALCIUM 8.4*   Liver Function Tests: No results for input(s): AST, ALT, ALKPHOS, BILITOT, PROT, ALBUMIN in the last 72 hours.   CBC:  Recent Labs  10/16/15 0611 10/17/15 0630  WBC 9.0 9.3  HGB 13.9 14.1  HCT 42.6 42.1  MCV 87.1 85.6  PLT 183 192    Recent Results (from the past 240 hour(s))  Urine culture     Status: None (Preliminary result)   Collection Time: 10/14/15  6:24 PM  Result Value Ref Range Status   Specimen Description URINE, RANDOM  Final   Special Requests Normal  Final   Culture   Final    TOO YOUNG TO READ Performed at [facility]    Report Status PENDING  Incomplete     Hospital Course: This is a 81 year old who was found in the floor at home. It is not totally clear how long she was down. She was unable to speak and seemed to have some left-sided weakness. CT did not show a definite abnormality but MRI showed what appeared to be an acute embolic stroke. She had trouble with speech and was really unable to speak initially and that improved. She has been very weak. She has what looks like a urinary tract infection and has symptoms of dysuria and frequency and that is being treated. She had PT speech OT evaluations. It was felt that she needed skilled care facility for rehabilitation and that is going to be arranged. She may need more help at home once she is at home. Her speech has improved markedly. She is still very weak. She will be started on anticoagulation because of chronic atrial fibrillation although  previously that had been withheld because of multiple falls. Since she's going to the rehabilitation center this should be safe and since she's going to have more help at home it should be safe. She will be started on medications for elevated lipids and lipid panel is pending.  Discharge Exam: Blood pressure 153/71, pulse 72, temperature 98 F (36.7 C), temperature source Oral, resp. rate 18, height 5\' 2"  (1.575 m), weight 77.111 kg (170 lb), SpO2 97 %. She is awake and alert. Speech is not back to normal but is much improved. Her chest is clear. She is in atrial fibrillation  Disposition: Discharged to skilled care  facility for rehabilitation. She will be on full anticoagulation. She will have PT OT and speech. She will be treated for 5 days for urinary tract infection with Keflex 500 mg 3 times a day. Plan will be for her to eventually return home assuming that that is safe with more help at home since she is at home alone      Signed: Carleton Vanvalkenburgh L   10/17/2015, 8:08 AM

## 2015-10-17 NOTE — Care Management Note (Signed)
Case Management Note  Patient Details  Name: Wendy Perkins MRN: [id number] Date of Birth: May 02, 1919  Subjective/Objective:                  Pt from home and was ind prior to admission. Pt admitted for CVA/UTI. PT recommends SNF and pt is agreeable.   Action/Plan: Pt discharging today to SNF. CSW is aware and will arrange for placement. Family has requested PD list to arrange for in home assistance after SNF. CM has provided list to daughter. Pt has no further CM needs.   Expected Discharge Date:    10/17/2015              Expected Discharge Plan:  Skilled Nursing Facility  In-House Referral:  Clinical Social Work  Discharge planning Services  CM Consult  Post Acute Care Choice:  NA Choice offered to:  NA  DME Arranged:    DME Agency:     HH Arranged:    [place] Agency:     Status of Service:  Completed, signed off  Medicare Important Message Given:    Date Medicare IM Given:    Medicare IM give by:    Date Additional Medicare IM Given:    Additional Medicare Important Message give by:     If discussed at [place] of Stay Meetings, dates discussed:    Additional Comments:  Sherald Barge, RN 10/17/2015, 12:57 PM

## 2015-10-17 NOTE — Progress Notes (Addendum)
Reported to Verlon Au, RN at [facility], discharged in stable condition, out via w/c with staff.

## 2015-10-17 NOTE — Care Management Important Message (Signed)
Important Message  Patient Details  Name: Wendy Perkins MRN: [id number] Date of Birth: 1919-05-02   Medicare Important Message Given:  Yes-second notification given    Sherald Barge, RN 10/17/2015, 12:58 PM

## 2015-10-17 NOTE — Discharge Instructions (Signed)
Apixaban oral tablets °What is this medicine? °APIXABAN (a PIX a ban) is an anticoagulant (blood thinner). It is used to lower the chance of stroke in people with a medical condition called atrial fibrillation. It is also used to treat or prevent blood clots in the lungs or in the veins. °This medicine may be used for other purposes; ask your health care provider or pharmacist if you have questions. °What should I tell my health care provider before I take this medicine? °They need to know if you have any of these conditions: °-bleeding disorders °-bleeding in the brain °-blood in your stools (black or tarry stools) or if you have blood in your vomit °-history of stomach bleeding °-kidney disease °-liver disease °-mechanical heart valve °-an unusual or allergic reaction to apixaban, other medicines, foods, dyes, or preservatives °-pregnant or trying to get pregnant °-breast-feeding °How should I use this medicine? °Take this medicine by mouth with a glass of water. Follow the directions on the prescription label. You can take it with or without food. If it upsets your stomach, take it with food. Take your medicine at regular intervals. Do not take it more often than directed. Do not stop taking except on your doctor's advice. Stopping this medicine may increase your risk of a blot clot. Be sure to refill your prescription before you run out of medicine. °Talk to your pediatrician regarding the use of this medicine in children. Special care may be needed. °Overdosage: If you think you have taken too much of this medicine contact a poison control center or emergency room at once. °NOTE: This medicine is only for you. Do not share this medicine with others. °What if I miss a dose? °If you miss a dose, take it as soon as you can. If it is almost time for your next dose, take only that dose. Do not take double or extra doses. °What may interact with this medicine? °This medicine may interact with the following: °-aspirin  and aspirin-like medicines °-certain medicines for fungal infections like ketoconazole and itraconazole °-certain medicines for seizures like carbamazepine and phenytoin °-certain medicines that treat or prevent blood clots like warfarin, enoxaparin, and dalteparin °-clarithromycin °-NSAIDs, medicines for pain and inflammation, like ibuprofen or naproxen °-rifampin °-ritonavir °-St. John's wort °This list may not describe all possible interactions. Give your health care provider a list of all the medicines, herbs, non-prescription drugs, or dietary supplements you use. Also tell them if you smoke, drink alcohol, or use illegal drugs. Some items may interact with your medicine. °What should I watch for while using this medicine? °Notify your doctor or health care professional and seek emergency treatment if you develop breathing problems; changes in vision; chest pain; severe, sudden headache; pain, swelling, warmth in the leg; trouble speaking; sudden numbness or weakness of the face, arm, or leg. These can be signs that your condition has gotten worse. °If you are going to have surgery, tell your doctor or health care professional that you are taking this medicine. °Tell your health care professional that you use this medicine before you have a spinal or epidural procedure. Sometimes people who take this medicine have bleeding problems around the spine when they have a spinal or epidural procedure. This bleeding is very rare. If you have a spinal or epidural procedure while on this medicine, call your health care professional immediately if you have back pain, numbness or tingling (especially in your legs and feet), muscle weakness, paralysis, or loss of bladder or bowel   control. °Avoid sports and activities that might cause injury while you are using this medicine. Severe falls or injuries can cause unseen bleeding. Be careful when using sharp tools or knives. Consider using an electric razor. Take special care  brushing or flossing your teeth. Report any injuries, bruising, or red spots on the skin to your doctor or health care professional. °What side effects may I notice from receiving this medicine? °Side effects that you should report to your doctor or health care professional as soon as possible: °-allergic reactions like skin rash, itching or hives, swelling of the face, lips, or tongue °-signs and symptoms of bleeding such as bloody or black, tarry stools; red or dark-brown urine; spitting up blood or brown material that looks like coffee grounds; red spots on the skin; unusual bruising or bleeding from the eye, gums, or nose °This list may not describe all possible side effects. Call your doctor for medical advice about side effects. You may report side effects to FDA at 1-800-FDA-1088. °Where should I keep my medicine? °Keep out of the reach of children. °Store at room temperature between 20 and 25 degrees C (68 and 77 degrees F). Throw away any unused medicine after the expiration date. °NOTE: This sheet is a summary. It may not cover all possible information. If you have questions about this medicine, talk to your doctor, pharmacist, or health care provider. °  °© 2016, [employer]. (2013-07-31 11:59:24) ° °

## 2015-10-20 ENCOUNTER — Encounter (HOSPITAL_COMMUNITY)
Admission: AD | Admit: 2015-10-20 | Discharge: 2015-10-20 | Disposition: A | Discharge status: Home / Self Care | Payer: Medicare Other | Source: Skilled Nursing Facility | Attending: Pulmonary Disease | Admitting: Pulmonary Disease

## 2015-10-20 LAB — BASIC METABOLIC PANEL
Anion gap: 8 (ref 5–15)
BUN: 22 mg/dL — AB (ref 6–20)
CHLORIDE: 107 mmol/L (ref 101–111)
CO2: 25 mmol/L (ref 22–32)
Calcium: 8.7 mg/dL — ABNORMAL LOW (ref 8.9–10.3)
Creatinine, Ser: 0.76 mg/dL (ref 0.44–1.00)
GFR calc Af Amer: >60 mL/min (ref >60)
GLUCOSE: 100 mg/dL — AB (ref 65–99)
POTASSIUM: 4.1 mmol/L (ref 3.5–5.1)
Sodium: 140 mmol/L (ref 135–145)

## 2015-10-30 DIAGNOSIS — I4891 Unspecified atrial fibrillation: Secondary | ICD-10-CM | POA: Diagnosis not present

## 2015-10-30 DIAGNOSIS — I6789 Other cerebrovascular disease: Secondary | ICD-10-CM | POA: Diagnosis not present

## 2015-10-31 NOTE — H&P (Signed)
Wendy Perkins MRN: [id number] DOB/AGE: 79-Sep-1920 79 y.o. Primary Care Physician:Jammy Stlouis L, MD Admit date: 10/17/2015 Chief Complaint: Stroke HPI: This is documentation of history and physical performed on 10/30/2015 at her skilled care facility. This is a 81 year old who had a stroke approximately 3 weeks ago. She was brought to the emergency department after being found at home in the floor. Initial evaluation did not show a definite cause. MRI however showed what appeared to be multiple embolic strokes. She has been known to have atrial fibrillation but had had multiple falls so she had not been fully anticoagulated. She has multiple other medical problems including COPD orthostatic hypotension carotid artery disease and hypertension she is at the skilled care facility for rehabilitation which is going well. She initially had severe problems with her speech and that has improved. She had mild weakness of her left side which is improving.  Past Medical History  Diagnosis Date  . Dyspnea     Previous CPX suggesting possible restrictive physiology, respiratory muscle fatigue, diastolic dysfunction  . Seasonal allergies   . Hyperlipidemia   . Essential hypertension, benign   . Carotid artery disease ([place])   . Dizziness   . Atrial fibrillation ([place])   . Toe fracture, left     left big toe  . Rib pain on left side 11/15/2014  . Breast pain, left 11/15/2014  . Breast nodule 11/15/2014  . Shingles 05/04/2015   Past Surgical History  Procedure Laterality Date  . Colonoscopy  02/22/2012    Procedure: COLONOSCOPY;  Surgeon: Rogene Houston, MD;  Location: [facility];  Service: Endoscopy;  Laterality: N/A;  100        Family History  Problem Relation Age of Onset  . Stroke Mother   . Pneumonia Father   . Heart disease Father   . Healthy Son   . Glaucoma Daughter   . Emphysema Daughter   . Healthy Daughter   . Other Daughter     had knee replacement  . Healthy Son   . Heart  disease Maternal Grandmother     Social History:  reports that she has never smoked. She has never used smokeless tobacco. She reports that she does not drink alcohol or use illicit drugs.   Allergies:  Allergies  Allergen Reactions  . Sulfur Swelling    Rash also per patient    Medications Prior to Admission  Medication Sig Dispense Refill  . acetaminophen (TYLENOL) 650 MG CR tablet Take 650 mg by mouth 2 (two) times daily.    Jearl Klinefelter ELLIPTA 62.5-25 MCG/INH AEPB Inhale 1 Inhaler into the lungs daily.     Marland Kitchen apixaban (ELIQUIS) 5 MG TABS tablet Take 1 tablet (5 mg total) by mouth 2 (two) times daily. 60 tablet 12  . aspirin 81 MG tablet Take 81 mg by mouth daily.    Marland Kitchen atorvastatin (LIPITOR) 80 MG tablet Take 1 tablet (80 mg total) by mouth daily at 6 PM.    . cephALEXin (KEFLEX) 500 MG capsule Take 1 capsule (500 mg total) by mouth 3 (three) times daily. 15 capsule 0  . cycloSPORINE (RESTASIS) 0.05 % ophthalmic emulsion Place 1 drop into both eyes every 12 (twelve) hours.     Marland Kitchen diltiazem (CARDIZEM CD) 180 MG 24 hr capsule TAKE ONE CAPSULE BY MOUTH ONCE DAILY. 30 capsule 6  . feeding supplement (BOOST / RESOURCE BREEZE) LIQD Take 1 Container by mouth 3 (three) times daily between meals.  0  . fluticasone (  FLONASE) 50 MCG/ACT nasal spray Place 2 sprays into the nose daily as needed for allergies.     . furosemide (LASIX) 20 MG tablet TAKE 1 TABLET BY MOUTH DAILY. 30 tablet 6  . Multiple Vitamin (MULITIVITAMIN WITH MINERALS) TABS Take 1 tablet by mouth. Takes 1 tab 2-3 times a week    . omeprazole (PRILOSEC) 20 MG capsule Take 20 mg by mouth daily.      Vladimir Faster Glycol-Propyl Glycol (SYSTANE OP) Apply 1 drop to eye daily. For dry eyes    . polyethylene glycol powder (GLYCOLAX/MIRALAX) powder Take 17 g by mouth as needed for mild constipation.     . potassium chloride SA (K-DUR,KLOR-CON) 20 MEQ tablet TAKE ONE TABLET BY MOUTH EVERY OTHER DAY. TAKE ONE THE DAY YOU TAKE FUROSEMIDE. 30  tablet 3  . PROAIR HFA 108 (90 BASE) MCG/ACT inhaler 2 puffs 3 (three) times daily as needed for wheezing (cough).     . VOLTAREN 1 % GEL Apply 2 g topically 2 (two) times daily.          [id number] from the symptoms mentioned above,there are no other symptoms referable to all systems reviewed.  Physical Exam: There were no vitals taken for this visit. She is awake and alert.  Speech is essentially back to normal. Her HEENT examination is generally unremarkable. Mucous membranes are moist. Pupils are reactive her neck is supple without masses. Her chest is clear. Her heart is not regular but does not show S3 gallop. Abdomen is soft without masses. She has 1+ edema of both legs. Central nervous system examination shows she has very mild residual left hemiparesis   No results for input(s): WBC, NEUTROABS, HGB, HCT, MCV, PLT in the last 72 hours. No results for input(s): NA, K, CL, CO2, GLUCOSE, BUN, CREATININE, CALCIUM, MG in the last 72 hours.  Invalid input(s): PHOlablast2(ast:2,ALT:2,alkphos:2,bilitot:2,prot:2,albumin:2)@    No results found for this or any previous visit (from the past 240 hour(s)).   Dg Thoracic Spine 2 View  10/13/2015  CLINICAL DATA:  Initial encounter for Pt fell today/pain back EXAM: THORACIC SPINE 2 VIEWS COMPARISON:  Chest radiograph of 08/25/2013 FINDINGS: S-shaped thoracic spine curvature. Cardiomegaly. Atherosclerosis in the transverse aorta. Swimmer's view suboptimal with the cervical thoracic junction and upper thoracic spine not well evaluated. The first lateral view images approximately from the top of T2 through the bottom of T12. Spondylosis, with maintenance of vertebral body height across these levels. IMPRESSION: Suboptimal evaluation of the cervicothoracic junction and upper most thoracic spine. Given this factor, spondylosis but no convincing evidence of acute superimposed process. Electronically Signed   By: Abigail Miyamoto M.D.   On: 10/13/2015 15:11    Dg Lumbar Spine Complete  10/13/2015  CLINICAL DATA:  Initial encounter for Pt fell today/pain back EXAM: LUMBAR SPINE - COMPLETE 4+ VIEW COMPARISON:  Lumbar spine MRI of 04/25/2012 FINDINGS: Diminutive right twelfth rib. Given this nomenclature, 5 lumbar type vertebral bodies with mild to moderate convex left curvature. Aortic and branch vessel atherosclerosis. Osteopenia. Lateral view mildly degraded by the extent of spinal curvature and osteopenia. Given this factor, vertebral body height alignment are maintained. Advanced facet arthropathy throughout the lower lumbar spine. IMPRESSION: Advanced spondylosis, without acute osseous finding. Electronically Signed   By: Abigail Miyamoto M.D.   On: 10/13/2015 15:08   Dg Wrist Complete Left  10/14/2015  CLINICAL DATA:  CVA.  Fall.  Left first ray pain. EXAM: LEFT WRIST - COMPLETE 3+ VIEW COMPARISON:  None. FINDINGS:  No fracture, dislocation or suspicious focal osseous lesion is seen in the left wrist. There is severe osteoarthritis at the first carpometacarpal joint. Severe fifth and moderate third metacarpophalangeal joint osteoarthritis. Moderate interphalangeal joint left thumb osteoarthritis. Tiny 4 mm metallic density foreign body in the soft tissues volar to the distal left second metacarpal. IMPRESSION: 1. No fracture or dislocation in the left wrist. 2. 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 3. Tiny 4 mm metallic density foreign body in the soft tissues volar to the distal left second metacarpal. Electronically Signed   By: Ilona Sorrel M.D.   On: 10/14/2015 20:34   Ct Head Wo Contrast  10/13/2015  CLINICAL DATA:  Fall, altered mental status EXAM: CT HEAD WITHOUT CONTRAST TECHNIQUE: Contiguous axial images were obtained from the base of the skull through the vertex without intravenous contrast. COMPARISON:  05/27/2012 FINDINGS: No skull fracture is noted. There is scalp swelling and mild subcutaneous stranding  midline posterior parietal scalp axial image 27. No intracranial hemorrhage, mass effect or midline shift. Mild cerebral atrophy. Mild periventricular white matter decreased attenuation probable due to chronic small vessel ischemic changes. No acute cortical infarction. No mass lesion is noted on this unenhanced scan. IMPRESSION: No acute intracranial abnormality. Stable cerebral atrophy. Mild scalp swelling and subcutaneous stranding midline posterior parietal region please see axial image 27. Electronically Signed   By: Lahoma Crocker M.D.   On: 10/13/2015 13:34   Mr Brain Wo Contrast  10/14/2015  CLINICAL DATA:  Altered mental status. EXAM: MRI HEAD WITHOUT CONTRAST TECHNIQUE: Multiplanar, multiecho pulse sequences of the brain and surrounding structures were obtained without intravenous contrast. COMPARISON:  CT head without contrast 10/13/2015. FINDINGS: The diffusion-weighted images demonstrate an acute nonhemorrhagic infarct involving the left caudate. There additional cortical foci of acute nonhemorrhagic infarct involving the left frontal operculum and left parietal lobe. T2 changes are associated with the areas of acute infarction. There is no associated hemorrhage. Additional extensive periventricular white matter changes are evident bilaterally. Remote cortical infarcts are present in the right parietal lobe as well. White matter changes extend into the brainstem. The cerebellum is unremarkable. Abnormal signal in the right vertebral artery is compatible with occlusion. The internal carotid arteries are patent bilaterally. The left vertebral artery is patent. The basilar artery is patent. Bilateral lens replacements are present. The globes orbits are otherwise within normal limits. The paranasal sinuses and mastoid air cells are clear. The skullbase is within normal limits. Midline structures demonstrate prominent soft tissue about the dens. The foramen magnum is patent. Midline structures are otherwise  unremarkable. IMPRESSION: 1. Acute nonhemorrhagic infarct involving the left caudate. 2. Multiple scattered cortical acute nonhemorrhagic infarcts within the left MCA territory. These involve the left frontal operculum and left parietal lobe. 3. Remote cortical infarcts of the right parietal lobe. 4. Extensive atrophy and white matter disease compatible with a history of chronic microvascular ischemia. 5. Occlusion of the right vertebral artery. These results were called by telephone at the time of interpretation on 10/14/2015 at 8:55 am to Dr. Luan Pulling, who verbally acknowledged these results. Electronically Signed   By: San Morelle M.D.   On: 10/14/2015 08:54   US Carotid Bilateral  10/16/2015  CLINICAL DATA:  CVA. History of hypertension, stroke and hyperlipidemia. EXAM: BILATERAL CAROTID DUPLEX ULTRASOUND TECHNIQUE: Pearline Cables scale imaging, color Doppler and duplex ultrasound were performed of bilateral carotid and vertebral arteries in the neck. COMPARISON:  Carotid Doppler ultrasound- 11/21/2009 FINDINGS: Criteria: Quantification of  carotid stenosis is based on velocity parameters that correlate the residual internal carotid diameter with NASCET-based stenosis levels, using the diameter of the distal internal carotid lumen as the denominator for stenosis measurement. The following velocity measurements were obtained: RIGHT ICA:  155/21 cm/sec CCA:  [id number] cm/sec SYSTOLIC ICA/CCA RATIO:  1.8 DIASTOLIC ICA/CCA RATIO:  2.0 ECA:  171 cm/sec LEFT ICA:  156/18 cm/sec CCA:  [id number] cm/sec SYSTOLIC ICA/CCA RATIO:  1.7 DIASTOLIC ICA/CCA RATIO:  1.8 ECA:  152 cm/sec RIGHT CAROTID ARTERY: There is a minimal amount of scattered eccentric mixed echogenic plaque throughout the right common carotid artery (representative images 3 and 7). There is a moderate to large amount of eccentric mixed echogenic plaque within the right carotid bulb (images 14 and 16), extending to involve the origin and proximal aspect of the right  internal carotid artery (image 24) progressed since the 11/2009 examination) resulting in elevated peak systolic velocities throughout the interrogated course of the right internal carotid artery (greatest acquired peak systolic velocity within the mid aspect the right internal carotid artery measures 155 cm/sec - image 29). RIGHT VERTEBRAL ARTERY: Not visualized - note, the right vertebral artery was noted to be atretic on the 11/2009 examination. LEFT CAROTID ARTERY: There is a moderate to large amount of eccentric mixed echogenic plaque throughout the left common carotid artery (representative images 36 and 41). There is a moderate to large amount of eccentric mixed echogenic plaque within the left carotid bulb (image 50), extending to involve the origin and proximal aspect the left internal carotid artery (image 58), likely progressed since the 11/2009 examination though now resulting in elevated peak systolic velocities within the proximal aspect of the left internal carotid artery (greatest acquired peak systolic velocity within the proximal ICA measures 156 cm/sec - image 60). LEFT VERTEBRAL ARTERY:  Antegrade flow IMPRESSION: 1. Interval progression of bilateral atherosclerotic plaque resulting in elevated peak systolic velocities within the bilateral internal carotid arteries compatible with the 50-69% luminal narrowing range bilaterally. Further evaluation with CTA could be performed as clinically indicated. 2. Non visualization of a patent right vertebral artery however note the right vertebral artery was noted to be atretic on the 11/2009 examination. Again, this could be further evaluated at the time of CTA. Electronically Signed   By: Sandi Mariscal M.D.   On: 10/16/2015 12:31   Dg Hand Complete Left  10/14/2015  CLINICAL DATA:  Fall.  Left first ray pain. EXAM: LEFT HAND - COMPLETE 3+ VIEW COMPARISON:  None. FINDINGS: No fracture, dislocation or suspicious focal osseous lesion in the left wrist. Tiny  4 mm metallic density foreign body in the soft tissues volar to the distal left second metacarpal. Severe polyarticular erosive osteoarthritis throughout the left hand and wrist, most prominently involving the second through fifth distal interphalangeal joints and third and fourth proximal interphalangeal joints. IMPRESSION: 1. No fracture or dislocation in the left hand. 2. Severe polyarticular erosive osteoarthritis in the left hand as described. 3. Tiny 4 mm metallic density foreign body in the soft tissues volar to the distal left second metacarpal. Electronically Signed   By: Ilona Sorrel M.D.   On: 10/14/2015 20:36   Impression: She had a stroke. She has atrial fibrillation and is now fully anticoagulated. She has COPD which is stable. She has chronic orthostatic hypotension which is much improved Active Problems:   * No active hospital problems. *     Plan: For rehabilitation at the skilled care facility and then plan is for  her to return home with help at home      Zafira Munos L   10/31/2015, 7:58 AM

## 2015-11-07 DIAGNOSIS — F22 Delusional disorders: Secondary | ICD-10-CM | POA: Diagnosis not present

## 2015-11-07 DIAGNOSIS — E785 Hyperlipidemia, unspecified: Secondary | ICD-10-CM | POA: Diagnosis not present

## 2015-11-07 DIAGNOSIS — M6281 Muscle weakness (generalized): Secondary | ICD-10-CM | POA: Diagnosis not present

## 2015-11-07 DIAGNOSIS — Z9181 History of falling: Secondary | ICD-10-CM | POA: Diagnosis not present

## 2015-11-07 DIAGNOSIS — I5032 Chronic diastolic (congestive) heart failure: Secondary | ICD-10-CM | POA: Diagnosis not present

## 2015-11-07 DIAGNOSIS — R2681 Unsteadiness on feet: Secondary | ICD-10-CM | POA: Diagnosis not present

## 2015-11-07 DIAGNOSIS — Z7952 Long term (current) use of systemic steroids: Secondary | ICD-10-CM | POA: Diagnosis not present

## 2015-11-07 DIAGNOSIS — Z7901 Long term (current) use of anticoagulants: Secondary | ICD-10-CM | POA: Diagnosis not present

## 2015-11-07 DIAGNOSIS — Z87440 Personal history of urinary (tract) infections: Secondary | ICD-10-CM | POA: Diagnosis not present

## 2015-11-07 DIAGNOSIS — I110 Hypertensive heart disease with heart failure: Secondary | ICD-10-CM | POA: Diagnosis not present

## 2015-11-07 DIAGNOSIS — Z8673 Personal history of transient ischemic attack (TIA), and cerebral infarction without residual deficits: Secondary | ICD-10-CM | POA: Diagnosis not present

## 2015-11-07 DIAGNOSIS — J449 Chronic obstructive pulmonary disease, unspecified: Secondary | ICD-10-CM | POA: Diagnosis not present

## 2015-11-07 DIAGNOSIS — M25561 Pain in right knee: Secondary | ICD-10-CM | POA: Diagnosis not present

## 2015-11-08 DIAGNOSIS — R2681 Unsteadiness on feet: Secondary | ICD-10-CM | POA: Diagnosis not present

## 2015-11-08 DIAGNOSIS — Z8673 Personal history of transient ischemic attack (TIA), and cerebral infarction without residual deficits: Secondary | ICD-10-CM | POA: Diagnosis not present

## 2015-11-08 DIAGNOSIS — Z9181 History of falling: Secondary | ICD-10-CM | POA: Diagnosis not present

## 2015-11-08 DIAGNOSIS — M6281 Muscle weakness (generalized): Secondary | ICD-10-CM | POA: Diagnosis not present

## 2015-11-08 DIAGNOSIS — Z7952 Long term (current) use of systemic steroids: Secondary | ICD-10-CM | POA: Diagnosis not present

## 2015-11-08 DIAGNOSIS — J449 Chronic obstructive pulmonary disease, unspecified: Secondary | ICD-10-CM | POA: Diagnosis not present

## 2015-11-08 DIAGNOSIS — I5032 Chronic diastolic (congestive) heart failure: Secondary | ICD-10-CM | POA: Diagnosis not present

## 2015-11-08 DIAGNOSIS — Z87440 Personal history of urinary (tract) infections: Secondary | ICD-10-CM | POA: Diagnosis not present

## 2015-11-08 DIAGNOSIS — I110 Hypertensive heart disease with heart failure: Secondary | ICD-10-CM | POA: Diagnosis not present

## 2015-11-08 DIAGNOSIS — E785 Hyperlipidemia, unspecified: Secondary | ICD-10-CM | POA: Diagnosis not present

## 2015-11-08 DIAGNOSIS — Z7901 Long term (current) use of anticoagulants: Secondary | ICD-10-CM | POA: Diagnosis not present

## 2015-11-08 DIAGNOSIS — F22 Delusional disorders: Secondary | ICD-10-CM | POA: Diagnosis not present

## 2015-11-08 DIAGNOSIS — M25561 Pain in right knee: Secondary | ICD-10-CM | POA: Diagnosis not present

## 2015-11-10 DIAGNOSIS — Z7901 Long term (current) use of anticoagulants: Secondary | ICD-10-CM | POA: Diagnosis not present

## 2015-11-10 DIAGNOSIS — M6281 Muscle weakness (generalized): Secondary | ICD-10-CM | POA: Diagnosis not present

## 2015-11-10 DIAGNOSIS — J449 Chronic obstructive pulmonary disease, unspecified: Secondary | ICD-10-CM | POA: Diagnosis not present

## 2015-11-10 DIAGNOSIS — F22 Delusional disorders: Secondary | ICD-10-CM | POA: Diagnosis not present

## 2015-11-10 DIAGNOSIS — Z8673 Personal history of transient ischemic attack (TIA), and cerebral infarction without residual deficits: Secondary | ICD-10-CM | POA: Diagnosis not present

## 2015-11-10 DIAGNOSIS — I5032 Chronic diastolic (congestive) heart failure: Secondary | ICD-10-CM | POA: Diagnosis not present

## 2015-11-10 DIAGNOSIS — Z9181 History of falling: Secondary | ICD-10-CM | POA: Diagnosis not present

## 2015-11-10 DIAGNOSIS — I110 Hypertensive heart disease with heart failure: Secondary | ICD-10-CM | POA: Diagnosis not present

## 2015-11-10 DIAGNOSIS — Z87440 Personal history of urinary (tract) infections: Secondary | ICD-10-CM | POA: Diagnosis not present

## 2015-11-10 DIAGNOSIS — M25561 Pain in right knee: Secondary | ICD-10-CM | POA: Diagnosis not present

## 2015-11-10 DIAGNOSIS — E785 Hyperlipidemia, unspecified: Secondary | ICD-10-CM | POA: Diagnosis not present

## 2015-11-10 DIAGNOSIS — R2681 Unsteadiness on feet: Secondary | ICD-10-CM | POA: Diagnosis not present

## 2015-11-10 DIAGNOSIS — Z7952 Long term (current) use of systemic steroids: Secondary | ICD-10-CM | POA: Diagnosis not present

## 2015-11-11 DIAGNOSIS — Z9181 History of falling: Secondary | ICD-10-CM | POA: Diagnosis not present

## 2015-11-11 DIAGNOSIS — R2681 Unsteadiness on feet: Secondary | ICD-10-CM | POA: Diagnosis not present

## 2015-11-11 DIAGNOSIS — Z7952 Long term (current) use of systemic steroids: Secondary | ICD-10-CM | POA: Diagnosis not present

## 2015-11-11 DIAGNOSIS — M25561 Pain in right knee: Secondary | ICD-10-CM | POA: Diagnosis not present

## 2015-11-11 DIAGNOSIS — J449 Chronic obstructive pulmonary disease, unspecified: Secondary | ICD-10-CM | POA: Diagnosis not present

## 2015-11-11 DIAGNOSIS — I5032 Chronic diastolic (congestive) heart failure: Secondary | ICD-10-CM | POA: Diagnosis not present

## 2015-11-11 DIAGNOSIS — Z8673 Personal history of transient ischemic attack (TIA), and cerebral infarction without residual deficits: Secondary | ICD-10-CM | POA: Diagnosis not present

## 2015-11-11 DIAGNOSIS — E785 Hyperlipidemia, unspecified: Secondary | ICD-10-CM | POA: Diagnosis not present

## 2015-11-11 DIAGNOSIS — M6281 Muscle weakness (generalized): Secondary | ICD-10-CM | POA: Diagnosis not present

## 2015-11-11 DIAGNOSIS — Z87440 Personal history of urinary (tract) infections: Secondary | ICD-10-CM | POA: Diagnosis not present

## 2015-11-11 DIAGNOSIS — I110 Hypertensive heart disease with heart failure: Secondary | ICD-10-CM | POA: Diagnosis not present

## 2015-11-11 DIAGNOSIS — Z7901 Long term (current) use of anticoagulants: Secondary | ICD-10-CM | POA: Diagnosis not present

## 2015-11-11 DIAGNOSIS — F22 Delusional disorders: Secondary | ICD-10-CM | POA: Diagnosis not present

## 2015-11-14 DIAGNOSIS — M6281 Muscle weakness (generalized): Secondary | ICD-10-CM | POA: Diagnosis not present

## 2015-11-14 DIAGNOSIS — Z7952 Long term (current) use of systemic steroids: Secondary | ICD-10-CM | POA: Diagnosis not present

## 2015-11-14 DIAGNOSIS — F22 Delusional disorders: Secondary | ICD-10-CM | POA: Diagnosis not present

## 2015-11-14 DIAGNOSIS — I5032 Chronic diastolic (congestive) heart failure: Secondary | ICD-10-CM | POA: Diagnosis not present

## 2015-11-14 DIAGNOSIS — R2681 Unsteadiness on feet: Secondary | ICD-10-CM | POA: Diagnosis not present

## 2015-11-14 DIAGNOSIS — Z8673 Personal history of transient ischemic attack (TIA), and cerebral infarction without residual deficits: Secondary | ICD-10-CM | POA: Diagnosis not present

## 2015-11-14 DIAGNOSIS — Z9181 History of falling: Secondary | ICD-10-CM | POA: Diagnosis not present

## 2015-11-14 DIAGNOSIS — J449 Chronic obstructive pulmonary disease, unspecified: Secondary | ICD-10-CM | POA: Diagnosis not present

## 2015-11-14 DIAGNOSIS — Z7901 Long term (current) use of anticoagulants: Secondary | ICD-10-CM | POA: Diagnosis not present

## 2015-11-14 DIAGNOSIS — E785 Hyperlipidemia, unspecified: Secondary | ICD-10-CM | POA: Diagnosis not present

## 2015-11-14 DIAGNOSIS — Z87440 Personal history of urinary (tract) infections: Secondary | ICD-10-CM | POA: Diagnosis not present

## 2015-11-14 DIAGNOSIS — M25561 Pain in right knee: Secondary | ICD-10-CM | POA: Diagnosis not present

## 2015-11-14 DIAGNOSIS — I110 Hypertensive heart disease with heart failure: Secondary | ICD-10-CM | POA: Diagnosis not present

## 2015-11-15 DIAGNOSIS — Z9181 History of falling: Secondary | ICD-10-CM | POA: Diagnosis not present

## 2015-11-15 DIAGNOSIS — R2681 Unsteadiness on feet: Secondary | ICD-10-CM | POA: Diagnosis not present

## 2015-11-15 DIAGNOSIS — I5032 Chronic diastolic (congestive) heart failure: Secondary | ICD-10-CM | POA: Diagnosis not present

## 2015-11-15 DIAGNOSIS — J449 Chronic obstructive pulmonary disease, unspecified: Secondary | ICD-10-CM | POA: Diagnosis not present

## 2015-11-15 DIAGNOSIS — Z87440 Personal history of urinary (tract) infections: Secondary | ICD-10-CM | POA: Diagnosis not present

## 2015-11-15 DIAGNOSIS — Z7952 Long term (current) use of systemic steroids: Secondary | ICD-10-CM | POA: Diagnosis not present

## 2015-11-15 DIAGNOSIS — M6281 Muscle weakness (generalized): Secondary | ICD-10-CM | POA: Diagnosis not present

## 2015-11-15 DIAGNOSIS — Z8673 Personal history of transient ischemic attack (TIA), and cerebral infarction without residual deficits: Secondary | ICD-10-CM | POA: Diagnosis not present

## 2015-11-15 DIAGNOSIS — Z7901 Long term (current) use of anticoagulants: Secondary | ICD-10-CM | POA: Diagnosis not present

## 2015-11-15 DIAGNOSIS — E785 Hyperlipidemia, unspecified: Secondary | ICD-10-CM | POA: Diagnosis not present

## 2015-11-15 DIAGNOSIS — M25561 Pain in right knee: Secondary | ICD-10-CM | POA: Diagnosis not present

## 2015-11-15 DIAGNOSIS — I110 Hypertensive heart disease with heart failure: Secondary | ICD-10-CM | POA: Diagnosis not present

## 2015-11-15 DIAGNOSIS — F22 Delusional disorders: Secondary | ICD-10-CM | POA: Diagnosis not present

## 2015-11-16 DIAGNOSIS — Z7901 Long term (current) use of anticoagulants: Secondary | ICD-10-CM | POA: Diagnosis not present

## 2015-11-16 DIAGNOSIS — M6281 Muscle weakness (generalized): Secondary | ICD-10-CM | POA: Diagnosis not present

## 2015-11-16 DIAGNOSIS — R2681 Unsteadiness on feet: Secondary | ICD-10-CM | POA: Diagnosis not present

## 2015-11-16 DIAGNOSIS — J449 Chronic obstructive pulmonary disease, unspecified: Secondary | ICD-10-CM | POA: Diagnosis not present

## 2015-11-16 DIAGNOSIS — I110 Hypertensive heart disease with heart failure: Secondary | ICD-10-CM | POA: Diagnosis not present

## 2015-11-16 DIAGNOSIS — E785 Hyperlipidemia, unspecified: Secondary | ICD-10-CM | POA: Diagnosis not present

## 2015-11-16 DIAGNOSIS — Z87440 Personal history of urinary (tract) infections: Secondary | ICD-10-CM | POA: Diagnosis not present

## 2015-11-16 DIAGNOSIS — Z7952 Long term (current) use of systemic steroids: Secondary | ICD-10-CM | POA: Diagnosis not present

## 2015-11-16 DIAGNOSIS — Z8673 Personal history of transient ischemic attack (TIA), and cerebral infarction without residual deficits: Secondary | ICD-10-CM | POA: Diagnosis not present

## 2015-11-16 DIAGNOSIS — F22 Delusional disorders: Secondary | ICD-10-CM | POA: Diagnosis not present

## 2015-11-16 DIAGNOSIS — M25561 Pain in right knee: Secondary | ICD-10-CM | POA: Diagnosis not present

## 2015-11-16 DIAGNOSIS — Z9181 History of falling: Secondary | ICD-10-CM | POA: Diagnosis not present

## 2015-11-16 DIAGNOSIS — I5032 Chronic diastolic (congestive) heart failure: Secondary | ICD-10-CM | POA: Diagnosis not present

## 2015-11-18 DIAGNOSIS — J449 Chronic obstructive pulmonary disease, unspecified: Secondary | ICD-10-CM | POA: Diagnosis not present

## 2015-11-18 DIAGNOSIS — M25561 Pain in right knee: Secondary | ICD-10-CM | POA: Diagnosis not present

## 2015-11-18 DIAGNOSIS — I110 Hypertensive heart disease with heart failure: Secondary | ICD-10-CM | POA: Diagnosis not present

## 2015-11-18 DIAGNOSIS — M6281 Muscle weakness (generalized): Secondary | ICD-10-CM | POA: Diagnosis not present

## 2015-11-18 DIAGNOSIS — Z7952 Long term (current) use of systemic steroids: Secondary | ICD-10-CM | POA: Diagnosis not present

## 2015-11-18 DIAGNOSIS — I5032 Chronic diastolic (congestive) heart failure: Secondary | ICD-10-CM | POA: Diagnosis not present

## 2015-11-18 DIAGNOSIS — F22 Delusional disorders: Secondary | ICD-10-CM | POA: Diagnosis not present

## 2015-11-18 DIAGNOSIS — E785 Hyperlipidemia, unspecified: Secondary | ICD-10-CM | POA: Diagnosis not present

## 2015-11-18 DIAGNOSIS — Z87440 Personal history of urinary (tract) infections: Secondary | ICD-10-CM | POA: Diagnosis not present

## 2015-11-18 DIAGNOSIS — Z7901 Long term (current) use of anticoagulants: Secondary | ICD-10-CM | POA: Diagnosis not present

## 2015-11-18 DIAGNOSIS — R2681 Unsteadiness on feet: Secondary | ICD-10-CM | POA: Diagnosis not present

## 2015-11-18 DIAGNOSIS — Z8673 Personal history of transient ischemic attack (TIA), and cerebral infarction without residual deficits: Secondary | ICD-10-CM | POA: Diagnosis not present

## 2015-11-18 DIAGNOSIS — Z9181 History of falling: Secondary | ICD-10-CM | POA: Diagnosis not present

## 2015-11-21 DIAGNOSIS — I10 Essential (primary) hypertension: Secondary | ICD-10-CM | POA: Diagnosis not present

## 2015-11-21 DIAGNOSIS — Z87440 Personal history of urinary (tract) infections: Secondary | ICD-10-CM | POA: Diagnosis not present

## 2015-11-21 DIAGNOSIS — Z8673 Personal history of transient ischemic attack (TIA), and cerebral infarction without residual deficits: Secondary | ICD-10-CM | POA: Diagnosis not present

## 2015-11-21 DIAGNOSIS — I110 Hypertensive heart disease with heart failure: Secondary | ICD-10-CM | POA: Diagnosis not present

## 2015-11-21 DIAGNOSIS — M6281 Muscle weakness (generalized): Secondary | ICD-10-CM | POA: Diagnosis not present

## 2015-11-21 DIAGNOSIS — F22 Delusional disorders: Secondary | ICD-10-CM | POA: Diagnosis not present

## 2015-11-21 DIAGNOSIS — I482 Chronic atrial fibrillation: Secondary | ICD-10-CM | POA: Diagnosis not present

## 2015-11-21 DIAGNOSIS — M25561 Pain in right knee: Secondary | ICD-10-CM | POA: Diagnosis not present

## 2015-11-21 DIAGNOSIS — I5032 Chronic diastolic (congestive) heart failure: Secondary | ICD-10-CM | POA: Diagnosis not present

## 2015-11-21 DIAGNOSIS — Z7952 Long term (current) use of systemic steroids: Secondary | ICD-10-CM | POA: Diagnosis not present

## 2015-11-21 DIAGNOSIS — E785 Hyperlipidemia, unspecified: Secondary | ICD-10-CM | POA: Diagnosis not present

## 2015-11-21 DIAGNOSIS — Z7901 Long term (current) use of anticoagulants: Secondary | ICD-10-CM | POA: Diagnosis not present

## 2015-11-21 DIAGNOSIS — R2681 Unsteadiness on feet: Secondary | ICD-10-CM | POA: Diagnosis not present

## 2015-11-21 DIAGNOSIS — Z9181 History of falling: Secondary | ICD-10-CM | POA: Diagnosis not present

## 2015-11-21 DIAGNOSIS — J449 Chronic obstructive pulmonary disease, unspecified: Secondary | ICD-10-CM | POA: Diagnosis not present

## 2015-11-22 DIAGNOSIS — E785 Hyperlipidemia, unspecified: Secondary | ICD-10-CM | POA: Diagnosis not present

## 2015-11-22 DIAGNOSIS — Z87440 Personal history of urinary (tract) infections: Secondary | ICD-10-CM | POA: Diagnosis not present

## 2015-11-22 DIAGNOSIS — Z9181 History of falling: Secondary | ICD-10-CM | POA: Diagnosis not present

## 2015-11-22 DIAGNOSIS — F22 Delusional disorders: Secondary | ICD-10-CM | POA: Diagnosis not present

## 2015-11-22 DIAGNOSIS — I5032 Chronic diastolic (congestive) heart failure: Secondary | ICD-10-CM | POA: Diagnosis not present

## 2015-11-22 DIAGNOSIS — Z7901 Long term (current) use of anticoagulants: Secondary | ICD-10-CM | POA: Diagnosis not present

## 2015-11-22 DIAGNOSIS — I110 Hypertensive heart disease with heart failure: Secondary | ICD-10-CM | POA: Diagnosis not present

## 2015-11-22 DIAGNOSIS — Z7952 Long term (current) use of systemic steroids: Secondary | ICD-10-CM | POA: Diagnosis not present

## 2015-11-22 DIAGNOSIS — R2681 Unsteadiness on feet: Secondary | ICD-10-CM | POA: Diagnosis not present

## 2015-11-22 DIAGNOSIS — M25561 Pain in right knee: Secondary | ICD-10-CM | POA: Diagnosis not present

## 2015-11-22 DIAGNOSIS — J449 Chronic obstructive pulmonary disease, unspecified: Secondary | ICD-10-CM | POA: Diagnosis not present

## 2015-11-22 DIAGNOSIS — M6281 Muscle weakness (generalized): Secondary | ICD-10-CM | POA: Diagnosis not present

## 2015-11-22 DIAGNOSIS — Z8673 Personal history of transient ischemic attack (TIA), and cerebral infarction without residual deficits: Secondary | ICD-10-CM | POA: Diagnosis not present

## 2015-11-23 DIAGNOSIS — Z9181 History of falling: Secondary | ICD-10-CM | POA: Diagnosis not present

## 2015-11-23 DIAGNOSIS — M6281 Muscle weakness (generalized): Secondary | ICD-10-CM | POA: Diagnosis not present

## 2015-11-23 DIAGNOSIS — I5032 Chronic diastolic (congestive) heart failure: Secondary | ICD-10-CM | POA: Diagnosis not present

## 2015-11-23 DIAGNOSIS — M25561 Pain in right knee: Secondary | ICD-10-CM | POA: Diagnosis not present

## 2015-11-23 DIAGNOSIS — E785 Hyperlipidemia, unspecified: Secondary | ICD-10-CM | POA: Diagnosis not present

## 2015-11-23 DIAGNOSIS — Z87440 Personal history of urinary (tract) infections: Secondary | ICD-10-CM | POA: Diagnosis not present

## 2015-11-23 DIAGNOSIS — R2681 Unsteadiness on feet: Secondary | ICD-10-CM | POA: Diagnosis not present

## 2015-11-23 DIAGNOSIS — I110 Hypertensive heart disease with heart failure: Secondary | ICD-10-CM | POA: Diagnosis not present

## 2015-11-23 DIAGNOSIS — Z7901 Long term (current) use of anticoagulants: Secondary | ICD-10-CM | POA: Diagnosis not present

## 2015-11-23 DIAGNOSIS — J449 Chronic obstructive pulmonary disease, unspecified: Secondary | ICD-10-CM | POA: Diagnosis not present

## 2015-11-23 DIAGNOSIS — F22 Delusional disorders: Secondary | ICD-10-CM | POA: Diagnosis not present

## 2015-11-23 DIAGNOSIS — Z7952 Long term (current) use of systemic steroids: Secondary | ICD-10-CM | POA: Diagnosis not present

## 2015-11-23 DIAGNOSIS — Z8673 Personal history of transient ischemic attack (TIA), and cerebral infarction without residual deficits: Secondary | ICD-10-CM | POA: Diagnosis not present

## 2015-11-24 DIAGNOSIS — Z8673 Personal history of transient ischemic attack (TIA), and cerebral infarction without residual deficits: Secondary | ICD-10-CM | POA: Diagnosis not present

## 2015-11-24 DIAGNOSIS — Z9181 History of falling: Secondary | ICD-10-CM | POA: Diagnosis not present

## 2015-11-24 DIAGNOSIS — I110 Hypertensive heart disease with heart failure: Secondary | ICD-10-CM | POA: Diagnosis not present

## 2015-11-24 DIAGNOSIS — M6281 Muscle weakness (generalized): Secondary | ICD-10-CM | POA: Diagnosis not present

## 2015-11-24 DIAGNOSIS — I5032 Chronic diastolic (congestive) heart failure: Secondary | ICD-10-CM | POA: Diagnosis not present

## 2015-11-24 DIAGNOSIS — Z87440 Personal history of urinary (tract) infections: Secondary | ICD-10-CM | POA: Diagnosis not present

## 2015-11-24 DIAGNOSIS — J449 Chronic obstructive pulmonary disease, unspecified: Secondary | ICD-10-CM | POA: Diagnosis not present

## 2015-11-24 DIAGNOSIS — R2681 Unsteadiness on feet: Secondary | ICD-10-CM | POA: Diagnosis not present

## 2015-11-24 DIAGNOSIS — M25561 Pain in right knee: Secondary | ICD-10-CM | POA: Diagnosis not present

## 2015-11-24 DIAGNOSIS — F22 Delusional disorders: Secondary | ICD-10-CM | POA: Diagnosis not present

## 2015-11-24 DIAGNOSIS — E785 Hyperlipidemia, unspecified: Secondary | ICD-10-CM | POA: Diagnosis not present

## 2015-11-24 DIAGNOSIS — Z7901 Long term (current) use of anticoagulants: Secondary | ICD-10-CM | POA: Diagnosis not present

## 2015-11-24 DIAGNOSIS — Z7952 Long term (current) use of systemic steroids: Secondary | ICD-10-CM | POA: Diagnosis not present

## 2015-11-25 DIAGNOSIS — M6281 Muscle weakness (generalized): Secondary | ICD-10-CM | POA: Diagnosis not present

## 2015-11-25 DIAGNOSIS — J449 Chronic obstructive pulmonary disease, unspecified: Secondary | ICD-10-CM | POA: Diagnosis not present

## 2015-11-25 DIAGNOSIS — Z87440 Personal history of urinary (tract) infections: Secondary | ICD-10-CM | POA: Diagnosis not present

## 2015-11-25 DIAGNOSIS — Z7901 Long term (current) use of anticoagulants: Secondary | ICD-10-CM | POA: Diagnosis not present

## 2015-11-25 DIAGNOSIS — I110 Hypertensive heart disease with heart failure: Secondary | ICD-10-CM | POA: Diagnosis not present

## 2015-11-25 DIAGNOSIS — M25561 Pain in right knee: Secondary | ICD-10-CM | POA: Diagnosis not present

## 2015-11-25 DIAGNOSIS — R2681 Unsteadiness on feet: Secondary | ICD-10-CM | POA: Diagnosis not present

## 2015-11-25 DIAGNOSIS — F22 Delusional disorders: Secondary | ICD-10-CM | POA: Diagnosis not present

## 2015-11-25 DIAGNOSIS — Z9181 History of falling: Secondary | ICD-10-CM | POA: Diagnosis not present

## 2015-11-25 DIAGNOSIS — E785 Hyperlipidemia, unspecified: Secondary | ICD-10-CM | POA: Diagnosis not present

## 2015-11-25 DIAGNOSIS — Z7952 Long term (current) use of systemic steroids: Secondary | ICD-10-CM | POA: Diagnosis not present

## 2015-11-25 DIAGNOSIS — Z8673 Personal history of transient ischemic attack (TIA), and cerebral infarction without residual deficits: Secondary | ICD-10-CM | POA: Diagnosis not present

## 2015-11-25 DIAGNOSIS — I5032 Chronic diastolic (congestive) heart failure: Secondary | ICD-10-CM | POA: Diagnosis not present

## 2015-11-26 DIAGNOSIS — R0902 Hypoxemia: Secondary | ICD-10-CM | POA: Diagnosis not present

## 2015-11-26 DIAGNOSIS — R0602 Shortness of breath: Secondary | ICD-10-CM | POA: Diagnosis not present

## 2015-11-28 DIAGNOSIS — J449 Chronic obstructive pulmonary disease, unspecified: Secondary | ICD-10-CM | POA: Diagnosis not present

## 2015-11-28 DIAGNOSIS — I5032 Chronic diastolic (congestive) heart failure: Secondary | ICD-10-CM | POA: Diagnosis not present

## 2015-11-28 DIAGNOSIS — Z9181 History of falling: Secondary | ICD-10-CM | POA: Diagnosis not present

## 2015-11-28 DIAGNOSIS — Z7952 Long term (current) use of systemic steroids: Secondary | ICD-10-CM | POA: Diagnosis not present

## 2015-11-28 DIAGNOSIS — E785 Hyperlipidemia, unspecified: Secondary | ICD-10-CM | POA: Diagnosis not present

## 2015-11-28 DIAGNOSIS — F22 Delusional disorders: Secondary | ICD-10-CM | POA: Diagnosis not present

## 2015-11-28 DIAGNOSIS — Z8673 Personal history of transient ischemic attack (TIA), and cerebral infarction without residual deficits: Secondary | ICD-10-CM | POA: Diagnosis not present

## 2015-11-28 DIAGNOSIS — M6281 Muscle weakness (generalized): Secondary | ICD-10-CM | POA: Diagnosis not present

## 2015-11-28 DIAGNOSIS — R2681 Unsteadiness on feet: Secondary | ICD-10-CM | POA: Diagnosis not present

## 2015-11-28 DIAGNOSIS — Z7901 Long term (current) use of anticoagulants: Secondary | ICD-10-CM | POA: Diagnosis not present

## 2015-11-28 DIAGNOSIS — I110 Hypertensive heart disease with heart failure: Secondary | ICD-10-CM | POA: Diagnosis not present

## 2015-11-28 DIAGNOSIS — M25561 Pain in right knee: Secondary | ICD-10-CM | POA: Diagnosis not present

## 2015-11-28 DIAGNOSIS — Z87440 Personal history of urinary (tract) infections: Secondary | ICD-10-CM | POA: Diagnosis not present

## 2015-11-29 DIAGNOSIS — J449 Chronic obstructive pulmonary disease, unspecified: Secondary | ICD-10-CM | POA: Diagnosis not present

## 2015-11-29 DIAGNOSIS — Z8673 Personal history of transient ischemic attack (TIA), and cerebral infarction without residual deficits: Secondary | ICD-10-CM | POA: Diagnosis not present

## 2015-11-29 DIAGNOSIS — I110 Hypertensive heart disease with heart failure: Secondary | ICD-10-CM | POA: Diagnosis not present

## 2015-11-29 DIAGNOSIS — Z7901 Long term (current) use of anticoagulants: Secondary | ICD-10-CM | POA: Diagnosis not present

## 2015-11-29 DIAGNOSIS — Z9181 History of falling: Secondary | ICD-10-CM | POA: Diagnosis not present

## 2015-11-29 DIAGNOSIS — F22 Delusional disorders: Secondary | ICD-10-CM | POA: Diagnosis not present

## 2015-11-29 DIAGNOSIS — E785 Hyperlipidemia, unspecified: Secondary | ICD-10-CM | POA: Diagnosis not present

## 2015-11-29 DIAGNOSIS — Z7952 Long term (current) use of systemic steroids: Secondary | ICD-10-CM | POA: Diagnosis not present

## 2015-11-29 DIAGNOSIS — R2681 Unsteadiness on feet: Secondary | ICD-10-CM | POA: Diagnosis not present

## 2015-11-29 DIAGNOSIS — M25561 Pain in right knee: Secondary | ICD-10-CM | POA: Diagnosis not present

## 2015-11-29 DIAGNOSIS — I5032 Chronic diastolic (congestive) heart failure: Secondary | ICD-10-CM | POA: Diagnosis not present

## 2015-11-29 DIAGNOSIS — Z87440 Personal history of urinary (tract) infections: Secondary | ICD-10-CM | POA: Diagnosis not present

## 2015-11-29 DIAGNOSIS — M6281 Muscle weakness (generalized): Secondary | ICD-10-CM | POA: Diagnosis not present

## 2015-11-30 DIAGNOSIS — Z7901 Long term (current) use of anticoagulants: Secondary | ICD-10-CM | POA: Diagnosis not present

## 2015-11-30 DIAGNOSIS — M6281 Muscle weakness (generalized): Secondary | ICD-10-CM | POA: Diagnosis not present

## 2015-11-30 DIAGNOSIS — Z9181 History of falling: Secondary | ICD-10-CM | POA: Diagnosis not present

## 2015-11-30 DIAGNOSIS — M25561 Pain in right knee: Secondary | ICD-10-CM | POA: Diagnosis not present

## 2015-11-30 DIAGNOSIS — J449 Chronic obstructive pulmonary disease, unspecified: Secondary | ICD-10-CM | POA: Diagnosis not present

## 2015-11-30 DIAGNOSIS — Z87440 Personal history of urinary (tract) infections: Secondary | ICD-10-CM | POA: Diagnosis not present

## 2015-11-30 DIAGNOSIS — Z7952 Long term (current) use of systemic steroids: Secondary | ICD-10-CM | POA: Diagnosis not present

## 2015-11-30 DIAGNOSIS — I110 Hypertensive heart disease with heart failure: Secondary | ICD-10-CM | POA: Diagnosis not present

## 2015-11-30 DIAGNOSIS — I5032 Chronic diastolic (congestive) heart failure: Secondary | ICD-10-CM | POA: Diagnosis not present

## 2015-11-30 DIAGNOSIS — R2681 Unsteadiness on feet: Secondary | ICD-10-CM | POA: Diagnosis not present

## 2015-11-30 DIAGNOSIS — E785 Hyperlipidemia, unspecified: Secondary | ICD-10-CM | POA: Diagnosis not present

## 2015-11-30 DIAGNOSIS — Z8673 Personal history of transient ischemic attack (TIA), and cerebral infarction without residual deficits: Secondary | ICD-10-CM | POA: Diagnosis not present

## 2015-11-30 DIAGNOSIS — F22 Delusional disorders: Secondary | ICD-10-CM | POA: Diagnosis not present

## 2015-12-01 DIAGNOSIS — I110 Hypertensive heart disease with heart failure: Secondary | ICD-10-CM | POA: Diagnosis not present

## 2015-12-01 DIAGNOSIS — R2681 Unsteadiness on feet: Secondary | ICD-10-CM | POA: Diagnosis not present

## 2015-12-01 DIAGNOSIS — I5032 Chronic diastolic (congestive) heart failure: Secondary | ICD-10-CM | POA: Diagnosis not present

## 2015-12-01 DIAGNOSIS — F22 Delusional disorders: Secondary | ICD-10-CM | POA: Diagnosis not present

## 2015-12-01 DIAGNOSIS — Z8673 Personal history of transient ischemic attack (TIA), and cerebral infarction without residual deficits: Secondary | ICD-10-CM | POA: Diagnosis not present

## 2015-12-01 DIAGNOSIS — Z9181 History of falling: Secondary | ICD-10-CM | POA: Diagnosis not present

## 2015-12-01 DIAGNOSIS — M6281 Muscle weakness (generalized): Secondary | ICD-10-CM | POA: Diagnosis not present

## 2015-12-01 DIAGNOSIS — Z7952 Long term (current) use of systemic steroids: Secondary | ICD-10-CM | POA: Diagnosis not present

## 2015-12-01 DIAGNOSIS — Z7901 Long term (current) use of anticoagulants: Secondary | ICD-10-CM | POA: Diagnosis not present

## 2015-12-01 DIAGNOSIS — J449 Chronic obstructive pulmonary disease, unspecified: Secondary | ICD-10-CM | POA: Diagnosis not present

## 2015-12-01 DIAGNOSIS — E785 Hyperlipidemia, unspecified: Secondary | ICD-10-CM | POA: Diagnosis not present

## 2015-12-01 DIAGNOSIS — M25561 Pain in right knee: Secondary | ICD-10-CM | POA: Diagnosis not present

## 2015-12-01 DIAGNOSIS — Z87440 Personal history of urinary (tract) infections: Secondary | ICD-10-CM | POA: Diagnosis not present

## 2015-12-02 DIAGNOSIS — I5032 Chronic diastolic (congestive) heart failure: Secondary | ICD-10-CM | POA: Diagnosis not present

## 2015-12-02 DIAGNOSIS — Z7952 Long term (current) use of systemic steroids: Secondary | ICD-10-CM | POA: Diagnosis not present

## 2015-12-02 DIAGNOSIS — R2681 Unsteadiness on feet: Secondary | ICD-10-CM | POA: Diagnosis not present

## 2015-12-02 DIAGNOSIS — M6281 Muscle weakness (generalized): Secondary | ICD-10-CM | POA: Diagnosis not present

## 2015-12-02 DIAGNOSIS — F22 Delusional disorders: Secondary | ICD-10-CM | POA: Diagnosis not present

## 2015-12-02 DIAGNOSIS — J449 Chronic obstructive pulmonary disease, unspecified: Secondary | ICD-10-CM | POA: Diagnosis not present

## 2015-12-02 DIAGNOSIS — Z8673 Personal history of transient ischemic attack (TIA), and cerebral infarction without residual deficits: Secondary | ICD-10-CM | POA: Diagnosis not present

## 2015-12-02 DIAGNOSIS — M25561 Pain in right knee: Secondary | ICD-10-CM | POA: Diagnosis not present

## 2015-12-02 DIAGNOSIS — E785 Hyperlipidemia, unspecified: Secondary | ICD-10-CM | POA: Diagnosis not present

## 2015-12-02 DIAGNOSIS — Z87440 Personal history of urinary (tract) infections: Secondary | ICD-10-CM | POA: Diagnosis not present

## 2015-12-02 DIAGNOSIS — Z7901 Long term (current) use of anticoagulants: Secondary | ICD-10-CM | POA: Diagnosis not present

## 2015-12-02 DIAGNOSIS — Z9181 History of falling: Secondary | ICD-10-CM | POA: Diagnosis not present

## 2015-12-02 DIAGNOSIS — I110 Hypertensive heart disease with heart failure: Secondary | ICD-10-CM | POA: Diagnosis not present

## 2015-12-06 DIAGNOSIS — M6281 Muscle weakness (generalized): Secondary | ICD-10-CM | POA: Diagnosis not present

## 2015-12-06 DIAGNOSIS — M25561 Pain in right knee: Secondary | ICD-10-CM | POA: Diagnosis not present

## 2015-12-06 DIAGNOSIS — R2681 Unsteadiness on feet: Secondary | ICD-10-CM | POA: Diagnosis not present

## 2015-12-06 DIAGNOSIS — Z7901 Long term (current) use of anticoagulants: Secondary | ICD-10-CM | POA: Diagnosis not present

## 2015-12-06 DIAGNOSIS — F22 Delusional disorders: Secondary | ICD-10-CM | POA: Diagnosis not present

## 2015-12-06 DIAGNOSIS — Z8673 Personal history of transient ischemic attack (TIA), and cerebral infarction without residual deficits: Secondary | ICD-10-CM | POA: Diagnosis not present

## 2015-12-06 DIAGNOSIS — Z87440 Personal history of urinary (tract) infections: Secondary | ICD-10-CM | POA: Diagnosis not present

## 2015-12-06 DIAGNOSIS — Z7952 Long term (current) use of systemic steroids: Secondary | ICD-10-CM | POA: Diagnosis not present

## 2015-12-06 DIAGNOSIS — Z9181 History of falling: Secondary | ICD-10-CM | POA: Diagnosis not present

## 2015-12-06 DIAGNOSIS — I110 Hypertensive heart disease with heart failure: Secondary | ICD-10-CM | POA: Diagnosis not present

## 2015-12-06 DIAGNOSIS — J449 Chronic obstructive pulmonary disease, unspecified: Secondary | ICD-10-CM | POA: Diagnosis not present

## 2015-12-06 DIAGNOSIS — I5032 Chronic diastolic (congestive) heart failure: Secondary | ICD-10-CM | POA: Diagnosis not present

## 2015-12-06 DIAGNOSIS — E785 Hyperlipidemia, unspecified: Secondary | ICD-10-CM | POA: Diagnosis not present

## 2015-12-07 DIAGNOSIS — Z7952 Long term (current) use of systemic steroids: Secondary | ICD-10-CM | POA: Diagnosis not present

## 2015-12-07 DIAGNOSIS — E785 Hyperlipidemia, unspecified: Secondary | ICD-10-CM | POA: Diagnosis not present

## 2015-12-07 DIAGNOSIS — Z7901 Long term (current) use of anticoagulants: Secondary | ICD-10-CM | POA: Diagnosis not present

## 2015-12-07 DIAGNOSIS — M25561 Pain in right knee: Secondary | ICD-10-CM | POA: Diagnosis not present

## 2015-12-07 DIAGNOSIS — I5032 Chronic diastolic (congestive) heart failure: Secondary | ICD-10-CM | POA: Diagnosis not present

## 2015-12-07 DIAGNOSIS — Z87440 Personal history of urinary (tract) infections: Secondary | ICD-10-CM | POA: Diagnosis not present

## 2015-12-07 DIAGNOSIS — Z9181 History of falling: Secondary | ICD-10-CM | POA: Diagnosis not present

## 2015-12-07 DIAGNOSIS — R2681 Unsteadiness on feet: Secondary | ICD-10-CM | POA: Diagnosis not present

## 2015-12-07 DIAGNOSIS — F22 Delusional disorders: Secondary | ICD-10-CM | POA: Diagnosis not present

## 2015-12-07 DIAGNOSIS — Z8673 Personal history of transient ischemic attack (TIA), and cerebral infarction without residual deficits: Secondary | ICD-10-CM | POA: Diagnosis not present

## 2015-12-07 DIAGNOSIS — J449 Chronic obstructive pulmonary disease, unspecified: Secondary | ICD-10-CM | POA: Diagnosis not present

## 2015-12-07 DIAGNOSIS — I110 Hypertensive heart disease with heart failure: Secondary | ICD-10-CM | POA: Diagnosis not present

## 2015-12-07 DIAGNOSIS — M6281 Muscle weakness (generalized): Secondary | ICD-10-CM | POA: Diagnosis not present

## 2015-12-08 DIAGNOSIS — Z8673 Personal history of transient ischemic attack (TIA), and cerebral infarction without residual deficits: Secondary | ICD-10-CM | POA: Diagnosis not present

## 2015-12-08 DIAGNOSIS — Z9181 History of falling: Secondary | ICD-10-CM | POA: Diagnosis not present

## 2015-12-08 DIAGNOSIS — Z87440 Personal history of urinary (tract) infections: Secondary | ICD-10-CM | POA: Diagnosis not present

## 2015-12-08 DIAGNOSIS — J449 Chronic obstructive pulmonary disease, unspecified: Secondary | ICD-10-CM | POA: Diagnosis not present

## 2015-12-08 DIAGNOSIS — Z7952 Long term (current) use of systemic steroids: Secondary | ICD-10-CM | POA: Diagnosis not present

## 2015-12-08 DIAGNOSIS — F22 Delusional disorders: Secondary | ICD-10-CM | POA: Diagnosis not present

## 2015-12-08 DIAGNOSIS — I110 Hypertensive heart disease with heart failure: Secondary | ICD-10-CM | POA: Diagnosis not present

## 2015-12-08 DIAGNOSIS — I5032 Chronic diastolic (congestive) heart failure: Secondary | ICD-10-CM | POA: Diagnosis not present

## 2015-12-08 DIAGNOSIS — R2681 Unsteadiness on feet: Secondary | ICD-10-CM | POA: Diagnosis not present

## 2015-12-08 DIAGNOSIS — Z7901 Long term (current) use of anticoagulants: Secondary | ICD-10-CM | POA: Diagnosis not present

## 2015-12-08 DIAGNOSIS — E785 Hyperlipidemia, unspecified: Secondary | ICD-10-CM | POA: Diagnosis not present

## 2015-12-08 DIAGNOSIS — M25561 Pain in right knee: Secondary | ICD-10-CM | POA: Diagnosis not present

## 2015-12-08 DIAGNOSIS — M6281 Muscle weakness (generalized): Secondary | ICD-10-CM | POA: Diagnosis not present

## 2015-12-13 DIAGNOSIS — Z8673 Personal history of transient ischemic attack (TIA), and cerebral infarction without residual deficits: Secondary | ICD-10-CM | POA: Diagnosis not present

## 2015-12-13 DIAGNOSIS — M6281 Muscle weakness (generalized): Secondary | ICD-10-CM | POA: Diagnosis not present

## 2015-12-13 DIAGNOSIS — R2681 Unsteadiness on feet: Secondary | ICD-10-CM | POA: Diagnosis not present

## 2015-12-13 DIAGNOSIS — M25561 Pain in right knee: Secondary | ICD-10-CM | POA: Diagnosis not present

## 2015-12-14 DIAGNOSIS — M19011 Primary osteoarthritis, right shoulder: Secondary | ICD-10-CM | POA: Diagnosis not present

## 2015-12-14 DIAGNOSIS — M25561 Pain in right knee: Secondary | ICD-10-CM | POA: Diagnosis not present

## 2015-12-15 DIAGNOSIS — R2681 Unsteadiness on feet: Secondary | ICD-10-CM | POA: Diagnosis not present

## 2015-12-15 DIAGNOSIS — M6281 Muscle weakness (generalized): Secondary | ICD-10-CM | POA: Diagnosis not present

## 2015-12-15 DIAGNOSIS — Z8673 Personal history of transient ischemic attack (TIA), and cerebral infarction without residual deficits: Secondary | ICD-10-CM | POA: Diagnosis not present

## 2015-12-15 DIAGNOSIS — M25561 Pain in right knee: Secondary | ICD-10-CM | POA: Diagnosis not present

## 2015-12-16 DIAGNOSIS — M6281 Muscle weakness (generalized): Secondary | ICD-10-CM | POA: Diagnosis not present

## 2015-12-16 DIAGNOSIS — M25561 Pain in right knee: Secondary | ICD-10-CM | POA: Diagnosis not present

## 2015-12-16 DIAGNOSIS — R2681 Unsteadiness on feet: Secondary | ICD-10-CM | POA: Diagnosis not present

## 2015-12-16 DIAGNOSIS — Z8673 Personal history of transient ischemic attack (TIA), and cerebral infarction without residual deficits: Secondary | ICD-10-CM | POA: Diagnosis not present

## 2015-12-20 DIAGNOSIS — R2681 Unsteadiness on feet: Secondary | ICD-10-CM | POA: Diagnosis not present

## 2015-12-20 DIAGNOSIS — M25561 Pain in right knee: Secondary | ICD-10-CM | POA: Diagnosis not present

## 2015-12-20 DIAGNOSIS — Z8673 Personal history of transient ischemic attack (TIA), and cerebral infarction without residual deficits: Secondary | ICD-10-CM | POA: Diagnosis not present

## 2015-12-20 DIAGNOSIS — M6281 Muscle weakness (generalized): Secondary | ICD-10-CM | POA: Diagnosis not present

## 2015-12-21 DIAGNOSIS — M6281 Muscle weakness (generalized): Secondary | ICD-10-CM | POA: Diagnosis not present

## 2015-12-21 DIAGNOSIS — M25561 Pain in right knee: Secondary | ICD-10-CM | POA: Diagnosis not present

## 2015-12-21 DIAGNOSIS — R2681 Unsteadiness on feet: Secondary | ICD-10-CM | POA: Diagnosis not present

## 2015-12-21 DIAGNOSIS — Z8673 Personal history of transient ischemic attack (TIA), and cerebral infarction without residual deficits: Secondary | ICD-10-CM | POA: Diagnosis not present

## 2015-12-23 DIAGNOSIS — M25561 Pain in right knee: Secondary | ICD-10-CM | POA: Diagnosis not present

## 2015-12-23 DIAGNOSIS — R2681 Unsteadiness on feet: Secondary | ICD-10-CM | POA: Diagnosis not present

## 2015-12-23 DIAGNOSIS — Z8673 Personal history of transient ischemic attack (TIA), and cerebral infarction without residual deficits: Secondary | ICD-10-CM | POA: Diagnosis not present

## 2015-12-23 DIAGNOSIS — M6281 Muscle weakness (generalized): Secondary | ICD-10-CM | POA: Diagnosis not present

## 2015-12-25 DIAGNOSIS — Z8673 Personal history of transient ischemic attack (TIA), and cerebral infarction without residual deficits: Secondary | ICD-10-CM | POA: Diagnosis not present

## 2015-12-25 DIAGNOSIS — M25561 Pain in right knee: Secondary | ICD-10-CM | POA: Diagnosis not present

## 2015-12-25 DIAGNOSIS — R2681 Unsteadiness on feet: Secondary | ICD-10-CM | POA: Diagnosis not present

## 2015-12-25 DIAGNOSIS — M6281 Muscle weakness (generalized): Secondary | ICD-10-CM | POA: Diagnosis not present

## 2015-12-26 DIAGNOSIS — Z8673 Personal history of transient ischemic attack (TIA), and cerebral infarction without residual deficits: Secondary | ICD-10-CM | POA: Diagnosis not present

## 2015-12-26 DIAGNOSIS — M25561 Pain in right knee: Secondary | ICD-10-CM | POA: Diagnosis not present

## 2015-12-26 DIAGNOSIS — R2681 Unsteadiness on feet: Secondary | ICD-10-CM | POA: Diagnosis not present

## 2015-12-26 DIAGNOSIS — M6281 Muscle weakness (generalized): Secondary | ICD-10-CM | POA: Diagnosis not present

## 2015-12-27 DIAGNOSIS — R0602 Shortness of breath: Secondary | ICD-10-CM | POA: Diagnosis not present

## 2015-12-27 DIAGNOSIS — R2681 Unsteadiness on feet: Secondary | ICD-10-CM | POA: Diagnosis not present

## 2015-12-27 DIAGNOSIS — Z8673 Personal history of transient ischemic attack (TIA), and cerebral infarction without residual deficits: Secondary | ICD-10-CM | POA: Diagnosis not present

## 2015-12-27 DIAGNOSIS — M25561 Pain in right knee: Secondary | ICD-10-CM | POA: Diagnosis not present

## 2015-12-27 DIAGNOSIS — R0902 Hypoxemia: Secondary | ICD-10-CM | POA: Diagnosis not present

## 2015-12-27 DIAGNOSIS — M6281 Muscle weakness (generalized): Secondary | ICD-10-CM | POA: Diagnosis not present

## 2015-12-29 DIAGNOSIS — R2681 Unsteadiness on feet: Secondary | ICD-10-CM | POA: Diagnosis not present

## 2015-12-29 DIAGNOSIS — M6281 Muscle weakness (generalized): Secondary | ICD-10-CM | POA: Diagnosis not present

## 2015-12-29 DIAGNOSIS — M25561 Pain in right knee: Secondary | ICD-10-CM | POA: Diagnosis not present

## 2015-12-29 DIAGNOSIS — Z8673 Personal history of transient ischemic attack (TIA), and cerebral infarction without residual deficits: Secondary | ICD-10-CM | POA: Diagnosis not present

## 2015-12-30 DIAGNOSIS — Z8673 Personal history of transient ischemic attack (TIA), and cerebral infarction without residual deficits: Secondary | ICD-10-CM | POA: Diagnosis not present

## 2015-12-30 DIAGNOSIS — M25561 Pain in right knee: Secondary | ICD-10-CM | POA: Diagnosis not present

## 2015-12-30 DIAGNOSIS — M6281 Muscle weakness (generalized): Secondary | ICD-10-CM | POA: Diagnosis not present

## 2015-12-30 DIAGNOSIS — R2681 Unsteadiness on feet: Secondary | ICD-10-CM | POA: Diagnosis not present

## 2016-01-02 DIAGNOSIS — I10 Essential (primary) hypertension: Secondary | ICD-10-CM | POA: Diagnosis not present

## 2016-01-02 DIAGNOSIS — J449 Chronic obstructive pulmonary disease, unspecified: Secondary | ICD-10-CM | POA: Diagnosis not present

## 2016-01-02 DIAGNOSIS — I482 Chronic atrial fibrillation: Secondary | ICD-10-CM | POA: Diagnosis not present

## 2016-01-02 DIAGNOSIS — I951 Orthostatic hypotension: Secondary | ICD-10-CM | POA: Diagnosis not present

## 2016-01-03 DIAGNOSIS — M25561 Pain in right knee: Secondary | ICD-10-CM | POA: Diagnosis not present

## 2016-01-03 DIAGNOSIS — Z8673 Personal history of transient ischemic attack (TIA), and cerebral infarction without residual deficits: Secondary | ICD-10-CM | POA: Diagnosis not present

## 2016-01-03 DIAGNOSIS — M6281 Muscle weakness (generalized): Secondary | ICD-10-CM | POA: Diagnosis not present

## 2016-01-03 DIAGNOSIS — R2681 Unsteadiness on feet: Secondary | ICD-10-CM | POA: Diagnosis not present

## 2016-01-04 DIAGNOSIS — M25561 Pain in right knee: Secondary | ICD-10-CM | POA: Diagnosis not present

## 2016-01-04 DIAGNOSIS — R2681 Unsteadiness on feet: Secondary | ICD-10-CM | POA: Diagnosis not present

## 2016-01-04 DIAGNOSIS — M6281 Muscle weakness (generalized): Secondary | ICD-10-CM | POA: Diagnosis not present

## 2016-01-04 DIAGNOSIS — Z8673 Personal history of transient ischemic attack (TIA), and cerebral infarction without residual deficits: Secondary | ICD-10-CM | POA: Diagnosis not present

## 2016-01-10 DIAGNOSIS — Z1211 Encounter for screening for malignant neoplasm of colon: Secondary | ICD-10-CM | POA: Diagnosis not present

## 2016-01-25 DIAGNOSIS — H5213 Myopia, bilateral: Secondary | ICD-10-CM | POA: Diagnosis not present

## 2016-01-25 DIAGNOSIS — H33321 Round hole, right eye: Secondary | ICD-10-CM | POA: Diagnosis not present

## 2016-01-25 DIAGNOSIS — H524 Presbyopia: Secondary | ICD-10-CM | POA: Diagnosis not present

## 2016-01-25 DIAGNOSIS — H52223 Regular astigmatism, bilateral: Secondary | ICD-10-CM | POA: Diagnosis not present

## 2016-01-27 DIAGNOSIS — R0602 Shortness of breath: Secondary | ICD-10-CM | POA: Diagnosis not present

## 2016-01-27 DIAGNOSIS — R0902 Hypoxemia: Secondary | ICD-10-CM | POA: Diagnosis not present

## 2016-02-08 DIAGNOSIS — M25561 Pain in right knee: Secondary | ICD-10-CM | POA: Diagnosis not present

## 2016-02-08 DIAGNOSIS — M25461 Effusion, right knee: Secondary | ICD-10-CM | POA: Diagnosis not present

## 2016-02-21 DIAGNOSIS — M5416 Radiculopathy, lumbar region: Secondary | ICD-10-CM | POA: Diagnosis not present

## 2016-02-24 DIAGNOSIS — R0902 Hypoxemia: Secondary | ICD-10-CM | POA: Diagnosis not present

## 2016-02-24 DIAGNOSIS — R0602 Shortness of breath: Secondary | ICD-10-CM | POA: Diagnosis not present

## 2016-02-29 DIAGNOSIS — M25561 Pain in right knee: Secondary | ICD-10-CM | POA: Diagnosis not present

## 2016-03-08 DIAGNOSIS — C44622 Squamous cell carcinoma of skin of right upper limb, including shoulder: Secondary | ICD-10-CM | POA: Diagnosis not present

## 2016-03-26 DIAGNOSIS — R0602 Shortness of breath: Secondary | ICD-10-CM | POA: Diagnosis not present

## 2016-03-26 DIAGNOSIS — R0902 Hypoxemia: Secondary | ICD-10-CM | POA: Diagnosis not present

## 2016-03-28 ENCOUNTER — Other Ambulatory Visit (HOSPITAL_COMMUNITY): Payer: Self-pay | Admitting: Orthopaedic Surgery

## 2016-03-28 DIAGNOSIS — R296 Repeated falls: Secondary | ICD-10-CM

## 2016-03-28 DIAGNOSIS — M533 Sacrococcygeal disorders, not elsewhere classified: Secondary | ICD-10-CM

## 2016-03-28 DIAGNOSIS — M545 Low back pain, unspecified: Secondary | ICD-10-CM

## 2016-03-28 DIAGNOSIS — M47817 Spondylosis without myelopathy or radiculopathy, lumbosacral region: Secondary | ICD-10-CM | POA: Diagnosis not present

## 2016-03-28 DIAGNOSIS — M25551 Pain in right hip: Secondary | ICD-10-CM | POA: Diagnosis not present

## 2016-03-29 DIAGNOSIS — G459 Transient cerebral ischemic attack, unspecified: Secondary | ICD-10-CM | POA: Diagnosis not present

## 2016-03-29 DIAGNOSIS — I482 Chronic atrial fibrillation: Secondary | ICD-10-CM | POA: Diagnosis not present

## 2016-03-29 DIAGNOSIS — W19XXXA Unspecified fall, initial encounter: Secondary | ICD-10-CM | POA: Diagnosis not present

## 2016-03-29 DIAGNOSIS — C439 Malignant melanoma of skin, unspecified: Secondary | ICD-10-CM | POA: Diagnosis not present

## 2016-04-04 ENCOUNTER — Ambulatory Visit (HOSPITAL_COMMUNITY)
Admission: RE | Admit: 2016-04-04 | Discharge: 2016-04-04 | Disposition: A | Discharge status: Home / Self Care | Payer: Medicare Other | Source: Ambulatory Visit | Attending: Orthopaedic Surgery | Admitting: Orthopaedic Surgery

## 2016-04-04 DIAGNOSIS — M5136 Other intervertebral disc degeneration, lumbar region: Secondary | ICD-10-CM | POA: Insufficient documentation

## 2016-04-04 DIAGNOSIS — M545 Low back pain, unspecified: Secondary | ICD-10-CM

## 2016-04-04 DIAGNOSIS — M4806 Spinal stenosis, lumbar region: Secondary | ICD-10-CM | POA: Diagnosis not present

## 2016-04-04 DIAGNOSIS — M533 Sacrococcygeal disorders, not elsewhere classified: Secondary | ICD-10-CM

## 2016-04-04 DIAGNOSIS — R296 Repeated falls: Secondary | ICD-10-CM

## 2016-04-04 DIAGNOSIS — M47896 Other spondylosis, lumbar region: Secondary | ICD-10-CM | POA: Insufficient documentation

## 2016-04-04 DIAGNOSIS — M25552 Pain in left hip: Secondary | ICD-10-CM | POA: Diagnosis not present

## 2016-04-04 DIAGNOSIS — S3992XA Unspecified injury of lower back, initial encounter: Secondary | ICD-10-CM | POA: Diagnosis not present

## 2016-04-05 DIAGNOSIS — Z08 Encounter for follow-up examination after completed treatment for malignant neoplasm: Secondary | ICD-10-CM | POA: Diagnosis not present

## 2016-04-05 DIAGNOSIS — Z85828 Personal history of other malignant neoplasm of skin: Secondary | ICD-10-CM | POA: Diagnosis not present

## 2016-04-09 DIAGNOSIS — M5416 Radiculopathy, lumbar region: Secondary | ICD-10-CM | POA: Diagnosis not present

## 2016-04-25 DIAGNOSIS — R0902 Hypoxemia: Secondary | ICD-10-CM | POA: Diagnosis not present

## 2016-04-25 DIAGNOSIS — R0602 Shortness of breath: Secondary | ICD-10-CM | POA: Diagnosis not present

## 2016-04-30 DIAGNOSIS — M5417 Radiculopathy, lumbosacral region: Secondary | ICD-10-CM | POA: Diagnosis not present

## 2016-04-30 DIAGNOSIS — M5416 Radiculopathy, lumbar region: Secondary | ICD-10-CM | POA: Diagnosis not present

## 2016-04-30 DIAGNOSIS — M545 Low back pain: Secondary | ICD-10-CM | POA: Diagnosis not present

## 2016-05-16 DIAGNOSIS — I482 Chronic atrial fibrillation: Secondary | ICD-10-CM | POA: Diagnosis not present

## 2016-05-16 DIAGNOSIS — J449 Chronic obstructive pulmonary disease, unspecified: Secondary | ICD-10-CM | POA: Diagnosis not present

## 2016-05-16 DIAGNOSIS — I639 Cerebral infarction, unspecified: Secondary | ICD-10-CM | POA: Diagnosis not present

## 2016-05-16 DIAGNOSIS — R195 Other fecal abnormalities: Secondary | ICD-10-CM | POA: Diagnosis not present

## 2016-05-17 DIAGNOSIS — J449 Chronic obstructive pulmonary disease, unspecified: Secondary | ICD-10-CM | POA: Diagnosis not present

## 2016-05-17 DIAGNOSIS — I482 Chronic atrial fibrillation: Secondary | ICD-10-CM | POA: Diagnosis not present

## 2016-05-22 DIAGNOSIS — M5416 Radiculopathy, lumbar region: Secondary | ICD-10-CM | POA: Diagnosis not present

## 2016-05-26 DIAGNOSIS — R0902 Hypoxemia: Secondary | ICD-10-CM | POA: Diagnosis not present

## 2016-05-26 DIAGNOSIS — R0602 Shortness of breath: Secondary | ICD-10-CM | POA: Diagnosis not present

## 2016-05-30 DIAGNOSIS — M1711 Unilateral primary osteoarthritis, right knee: Secondary | ICD-10-CM | POA: Diagnosis not present

## 2016-06-05 ENCOUNTER — Encounter (INDEPENDENT_AMBULATORY_CARE_PROVIDER_SITE_OTHER): Payer: Self-pay | Admitting: Internal Medicine

## 2016-06-06 DIAGNOSIS — M1711 Unilateral primary osteoarthritis, right knee: Secondary | ICD-10-CM | POA: Diagnosis not present

## 2016-06-13 DIAGNOSIS — M9903 Segmental and somatic dysfunction of lumbar region: Secondary | ICD-10-CM | POA: Diagnosis not present

## 2016-06-13 DIAGNOSIS — M5441 Lumbago with sciatica, right side: Secondary | ICD-10-CM | POA: Diagnosis not present

## 2016-06-13 DIAGNOSIS — M9901 Segmental and somatic dysfunction of cervical region: Secondary | ICD-10-CM | POA: Diagnosis not present

## 2016-06-13 DIAGNOSIS — M9902 Segmental and somatic dysfunction of thoracic region: Secondary | ICD-10-CM | POA: Diagnosis not present

## 2016-06-19 DIAGNOSIS — M25561 Pain in right knee: Secondary | ICD-10-CM | POA: Diagnosis not present

## 2016-06-19 DIAGNOSIS — M25562 Pain in left knee: Secondary | ICD-10-CM | POA: Diagnosis not present

## 2016-06-19 DIAGNOSIS — R2241 Localized swelling, mass and lump, right lower limb: Secondary | ICD-10-CM | POA: Diagnosis not present

## 2016-06-19 DIAGNOSIS — R2242 Localized swelling, mass and lump, left lower limb: Secondary | ICD-10-CM | POA: Diagnosis not present

## 2016-06-20 ENCOUNTER — Ambulatory Visit (INDEPENDENT_AMBULATORY_CARE_PROVIDER_SITE_OTHER): Payer: Self-pay | Admitting: Internal Medicine

## 2016-06-25 DIAGNOSIS — R0902 Hypoxemia: Secondary | ICD-10-CM | POA: Diagnosis not present

## 2016-06-25 DIAGNOSIS — R0602 Shortness of breath: Secondary | ICD-10-CM | POA: Diagnosis not present

## 2016-06-27 ENCOUNTER — Ambulatory Visit (INDEPENDENT_AMBULATORY_CARE_PROVIDER_SITE_OTHER): Payer: Medicare Other | Admitting: Internal Medicine

## 2016-06-27 ENCOUNTER — Encounter (INDEPENDENT_AMBULATORY_CARE_PROVIDER_SITE_OTHER): Payer: Self-pay

## 2016-06-27 ENCOUNTER — Encounter (INDEPENDENT_AMBULATORY_CARE_PROVIDER_SITE_OTHER): Payer: Self-pay | Admitting: Internal Medicine

## 2016-06-27 VITALS — BP 110/70 | HR 80 | Temp 98.0°F | Ht 63.0 in | Wt 163.0 lb

## 2016-06-27 DIAGNOSIS — R195 Other fecal abnormalities: Secondary | ICD-10-CM | POA: Diagnosis not present

## 2016-06-27 NOTE — Progress Notes (Addendum)
Subjective:    Patient ID: Wendy Perkins, female    DOB: 1919-11-22, 82 y.o.   MRN: [id number]  HPI Per Dr. Luan Pulling records, her stools have been dark brown. Her son tells me Dr. Luan Pulling check stool and was positive for blood. Her stool per Dr. Luan Pulling was dark brown.  She has a BM daily. No BRRB or melena per patient. There has been no weight loss. There is no abdominal pain. Appetite is good. Patient is w/ch bound. He lat colonoscopy was in 2013 for BRRB.  No fever, No SOB.  She is w/c bound.    Hx of atrial fib and maintained on ASA . Eliquis was stopped 6-8 weeks ago after a fall.      02/22/2012 Colonoscopy  Indications: Patient is a 82 year old Caucasian female who experienced three day episode of bright red blood per rectum. She is undergoing diagnostic colonoscopy  Impression:  Examination performed to cecum. Pancolonic diverticulosis (she has multiple diverticula and sigmoid colon and few more involving the rest of the colon). 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 Scar at ileocecal valve secondary to healed ulceration(NSAID). Small external hemorrhoids.    05/17/2016 H and H 12.2 and 37.1, MCV 85.9   CBC Latest Ref Rng 10/17/2015 10/16/2015 10/14/2015  WBC 4.0 - 10.5 K/uL 9.3 9.0 8.1  Hemoglobin 12.0 - 15.0 g/dL 14.1 13.9 14.2  Hematocrit 36.0 - 46.0 % 42.1 42.6 43.2  Platelets 150 - 400 K/uL 192 183 207      Review of Systems Past Medical History  Diagnosis Date  . Dyspnea     Previous CPX suggesting possible restrictive physiology, respiratory muscle fatigue, diastolic dysfunction  . Seasonal allergies   . Hyperlipidemia   . Essential hypertension, benign   . Carotid artery disease ([place])   . Dizziness   . Atrial fibrillation ([place])   . Toe fracture, left     left big toe  . Rib pain on left side 11/15/2014  . Breast pain, left 11/15/2014  . Breast nodule 11/15/2014  . Shingles 05/04/2015    Past Surgical History  Procedure Laterality Date    . Colonoscopy  02/22/2012    Procedure: COLONOSCOPY;  Surgeon: Rogene Houston, MD;  Location: [facility];  Service: Endoscopy;  Laterality: N/A;  100    Allergies  Allergen Reactions  . Sulfur Swelling    Rash also per patient    Current Outpatient Prescriptions on File Prior to Visit  Medication Sig Dispense Refill  . acetaminophen (TYLENOL) 650 MG CR tablet Take 650 mg by mouth 2 (two) times daily.    Jearl Klinefelter ELLIPTA 62.5-25 MCG/INH AEPB Inhale 1 Inhaler into the lungs daily.     Marland Kitchen aspirin 81 MG tablet Take 81 mg by mouth daily.    Marland Kitchen atorvastatin (LIPITOR) 80 MG tablet Take 1 tablet (80 mg total) by mouth daily at 6 PM.    . cycloSPORINE (RESTASIS) 0.05 % ophthalmic emulsion Place 1 drop into both eyes every 12 (twelve) hours.     Marland Kitchen diltiazem (CARDIZEM CD) 180 MG 24 hr capsule TAKE ONE CAPSULE BY MOUTH ONCE DAILY. 30 capsule 6  . fluticasone (FLONASE) 50 MCG/ACT nasal spray Place 2 sprays into the nose daily as needed for allergies. Reported on 06/27/2016    . furosemide (LASIX) 20 MG tablet TAKE 1 TABLET BY MOUTH DAILY. 30 tablet 6  . Multiple Vitamin (MULITIVITAMIN WITH MINERALS) TABS Take 1 tablet by mouth. Takes 1 tab 2-3 times  a week    . omeprazole (PRILOSEC) 20 MG capsule Take 20 mg by mouth daily.      Vladimir Faster Glycol-Propyl Glycol (SYSTANE OP) Apply 1 drop to eye daily. For dry eyes    . potassium chloride SA (K-DUR,KLOR-CON) 20 MEQ tablet TAKE ONE TABLET BY MOUTH EVERY OTHER DAY. TAKE ONE THE DAY YOU TAKE FUROSEMIDE. 30 tablet 3  . PROAIR HFA 108 (90 BASE) MCG/ACT inhaler 2 puffs 3 (three) times daily as needed for wheezing (cough).     . [DISCONTINUED] Albuterol (VENTOLIN IN) Inhale into the lungs.       No current facility-administered medications on file prior to visit.        Objective:   Physical ExamBlood pressure 110/70, pulse 80, temperature 98 F (36.7 C), height 5\' 3"  (1.6 m), weight 163 lb (73.936 kg).  Alert and oriented. Skin warm and dry. Oral  mucosa is moist.   . Sclera anicteric, conjunctivae is pink. Thyroid not enlarged. No cervical lymphadenopathy. Lungs clear. Heart regular rate and rhythm.  Abdomen is soft. Bowel sounds are positive. No hepatomegaly. No abdominal masses felt. No tenderness.  2+ edema to lower extremities.  Stool brown and guaiac negative. Small external hemorrhoid noted.       Assessment & Plan:  Guaiac positive stool with normal H and H. Will repeat CBC today. She was guaiac negative today in office 3 stool cards home with patient. Further recommendations to follow.

## 2016-06-27 NOTE — Patient Instructions (Signed)
CBC today. Stool cards x 3 sent home with patient

## 2016-06-28 LAB — CBC WITH DIFFERENTIAL/PLATELET
Basophils Absolute: 0 cells/uL (ref 0–200)
Basophils Relative: 0 %
EOS PCT: 1 %
Eosinophils Absolute: 71 cells/uL (ref 15–500)
HEMATOCRIT: 38.2 % (ref 35.0–45.0)
Hemoglobin: 12.6 g/dL (ref 11.7–15.5)
LYMPHS PCT: 25 %
Lymphs Abs: 1775 cells/uL (ref 850–3900)
MCH: 28.4 pg (ref 27.0–33.0)
MCHC: 33 g/dL (ref 32.0–36.0)
MCV: 86.2 fL (ref 80.0–100.0)
MONO ABS: 852 {cells}/uL (ref 200–950)
MONOS PCT: 12 %
MPV: 9.7 fL (ref 7.5–12.5)
NEUTROS PCT: 62 %
Neutro Abs: 4402 cells/uL (ref 1500–7800)
PLATELETS: 256 10*3/uL (ref 140–400)
RBC: 4.43 MIL/uL (ref 3.80–5.10)
RDW: 15.1 % — AB (ref 11.0–15.0)
WBC: 7.1 10*3/uL (ref 3.8–10.8)

## 2016-07-11 ENCOUNTER — Telehealth (INDEPENDENT_AMBULATORY_CARE_PROVIDER_SITE_OTHER): Payer: Self-pay | Admitting: *Deleted

## 2016-07-11 NOTE — Telephone Encounter (Signed)
   Diagnosis:    Result(s)   Card 1:Negative:     Card 2: Negative:   Card 3: Negative:    Completed by: Aditri Louischarles,LPN   HEMOCCULT SENSA DEVELOPER: LOT#:[id number]   EXPIRATION DATE: 09-17   HEMOCCULT SENSA CARD:  LOT#:[id number]   EXPIRATION DATE: 07/18   CARD CONTROL RESULTS:  POSITIVE: Positive  NEGATIVE: Negative    ADDITIONAL COMMENTS: Forwarded to [place] for review.

## 2016-07-17 NOTE — Telephone Encounter (Signed)
Results given to grand daughter. [place]

## 2016-07-18 ENCOUNTER — Other Ambulatory Visit (INDEPENDENT_AMBULATORY_CARE_PROVIDER_SITE_OTHER): Payer: Self-pay | Admitting: *Deleted

## 2016-07-18 DIAGNOSIS — R195 Other fecal abnormalities: Secondary | ICD-10-CM

## 2016-07-26 DIAGNOSIS — R0602 Shortness of breath: Secondary | ICD-10-CM | POA: Diagnosis not present

## 2016-07-26 DIAGNOSIS — R0902 Hypoxemia: Secondary | ICD-10-CM | POA: Diagnosis not present

## 2016-08-06 ENCOUNTER — Observation Stay (HOSPITAL_COMMUNITY): Payer: Medicare Other

## 2016-08-06 ENCOUNTER — Encounter (HOSPITAL_COMMUNITY): Payer: Self-pay | Admitting: *Deleted

## 2016-08-06 ENCOUNTER — Observation Stay (HOSPITAL_COMMUNITY)
Admission: EM | Admit: 2016-08-06 | Discharge: 2016-08-07 | Disposition: A | Discharge status: Home / Self Care | Payer: Medicare Other | Attending: Pulmonary Disease | Admitting: Pulmonary Disease

## 2016-08-06 ENCOUNTER — Emergency Department (HOSPITAL_COMMUNITY): Payer: Medicare Other

## 2016-08-06 DIAGNOSIS — R531 Weakness: Secondary | ICD-10-CM | POA: Diagnosis not present

## 2016-08-06 DIAGNOSIS — G8929 Other chronic pain: Secondary | ICD-10-CM | POA: Diagnosis not present

## 2016-08-06 DIAGNOSIS — Y999 Unspecified external cause status: Secondary | ICD-10-CM | POA: Insufficient documentation

## 2016-08-06 DIAGNOSIS — Y9389 Activity, other specified: Secondary | ICD-10-CM | POA: Diagnosis not present

## 2016-08-06 DIAGNOSIS — J449 Chronic obstructive pulmonary disease, unspecified: Secondary | ICD-10-CM | POA: Diagnosis not present

## 2016-08-06 DIAGNOSIS — Z79899 Other long term (current) drug therapy: Secondary | ICD-10-CM | POA: Insufficient documentation

## 2016-08-06 DIAGNOSIS — M25561 Pain in right knee: Secondary | ICD-10-CM | POA: Diagnosis not present

## 2016-08-06 DIAGNOSIS — R52 Pain, unspecified: Secondary | ICD-10-CM | POA: Diagnosis not present

## 2016-08-06 DIAGNOSIS — W06XXXA Fall from bed, initial encounter: Secondary | ICD-10-CM | POA: Insufficient documentation

## 2016-08-06 DIAGNOSIS — W19XXXS Unspecified fall, sequela: Secondary | ICD-10-CM | POA: Diagnosis not present

## 2016-08-06 DIAGNOSIS — M549 Dorsalgia, unspecified: Secondary | ICD-10-CM | POA: Insufficient documentation

## 2016-08-06 DIAGNOSIS — I5032 Chronic diastolic (congestive) heart failure: Secondary | ICD-10-CM | POA: Insufficient documentation

## 2016-08-06 DIAGNOSIS — I110 Hypertensive heart disease with heart failure: Secondary | ICD-10-CM | POA: Diagnosis not present

## 2016-08-06 DIAGNOSIS — W19XXXA Unspecified fall, initial encounter: Secondary | ICD-10-CM | POA: Diagnosis present

## 2016-08-06 DIAGNOSIS — S79911A Unspecified injury of right hip, initial encounter: Secondary | ICD-10-CM | POA: Diagnosis not present

## 2016-08-06 DIAGNOSIS — Z7982 Long term (current) use of aspirin: Secondary | ICD-10-CM | POA: Diagnosis not present

## 2016-08-06 DIAGNOSIS — I951 Orthostatic hypotension: Secondary | ICD-10-CM | POA: Diagnosis present

## 2016-08-06 DIAGNOSIS — I10 Essential (primary) hypertension: Secondary | ICD-10-CM | POA: Diagnosis not present

## 2016-08-06 DIAGNOSIS — M25562 Pain in left knee: Secondary | ICD-10-CM | POA: Diagnosis present

## 2016-08-06 DIAGNOSIS — S8991XA Unspecified injury of right lower leg, initial encounter: Secondary | ICD-10-CM | POA: Diagnosis not present

## 2016-08-06 DIAGNOSIS — Y92009 Unspecified place in unspecified non-institutional (private) residence as the place of occurrence of the external cause: Secondary | ICD-10-CM | POA: Insufficient documentation

## 2016-08-06 DIAGNOSIS — I4891 Unspecified atrial fibrillation: Secondary | ICD-10-CM | POA: Diagnosis present

## 2016-08-06 DIAGNOSIS — M25569 Pain in unspecified knee: Secondary | ICD-10-CM | POA: Diagnosis present

## 2016-08-06 HISTORY — DX: Dependence on supplemental oxygen: Z99.81

## 2016-08-06 LAB — URINALYSIS, ROUTINE W REFLEX MICROSCOPIC
Bilirubin Urine: NEGATIVE
GLUCOSE, UA: NEGATIVE mg/dL
HGB URINE DIPSTICK: NEGATIVE
Ketones, ur: NEGATIVE mg/dL
Leukocytes, UA: NEGATIVE
Nitrite: NEGATIVE
PH: 6 (ref 5.0–8.0)
Protein, ur: NEGATIVE mg/dL

## 2016-08-06 LAB — COMPREHENSIVE METABOLIC PANEL
ALT: 17 U/L (ref 14–54)
AST: 20 U/L (ref 15–41)
Albumin: 3.6 g/dL (ref 3.5–5.0)
Alkaline Phosphatase: 87 U/L (ref 38–126)
Anion gap: 8 (ref 5–15)
BUN: 16 mg/dL (ref 6–20)
CHLORIDE: 105 mmol/L (ref 101–111)
CO2: 25 mmol/L (ref 22–32)
Calcium: 9 mg/dL (ref 8.9–10.3)
Creatinine, Ser: 0.58 mg/dL (ref 0.44–1.00)
GFR calc Af Amer: >60 mL/min (ref >60)
Glucose, Bld: 108 mg/dL — ABNORMAL HIGH (ref 65–99)
POTASSIUM: 3.5 mmol/L (ref 3.5–5.1)
SODIUM: 138 mmol/L (ref 135–145)
Total Bilirubin: 0.9 mg/dL (ref 0.3–1.2)
Total Protein: 6.3 g/dL — ABNORMAL LOW (ref 6.5–8.1)

## 2016-08-06 LAB — CBC WITH DIFFERENTIAL/PLATELET
Basophils Absolute: 0 10*3/uL (ref 0.0–0.1)
Basophils Relative: 1 %
EOS PCT: 2 %
Eosinophils Absolute: 0.2 10*3/uL (ref 0.0–0.7)
HCT: 39.9 % (ref 36.0–46.0)
HEMOGLOBIN: 13 g/dL (ref 12.0–15.0)
LYMPHS ABS: 1.3 10*3/uL (ref 0.7–4.0)
LYMPHS PCT: 16 %
MCH: 28.6 pg (ref 26.0–34.0)
MCHC: 32.6 g/dL (ref 30.0–36.0)
MCV: 87.7 fL (ref 78.0–100.0)
Monocytes Absolute: 1.2 10*3/uL — ABNORMAL HIGH (ref 0.1–1.0)
Monocytes Relative: 15 %
NEUTROS PCT: 66 %
Neutro Abs: 5.4 10*3/uL (ref 1.7–7.7)
Platelets: 242 10*3/uL (ref 150–400)
RBC: 4.55 MIL/uL (ref 3.87–5.11)
RDW: 13.8 % (ref 11.5–15.5)
WBC: 8.1 10*3/uL (ref 4.0–10.5)

## 2016-08-06 MED ORDER — CYCLOSPORINE 0.05 % OP EMUL
1.0000 [drp] | Freq: Two times a day (BID) | OPHTHALMIC | Status: DC
  Administered 2016-08-06 – 2016-08-07 (×3): 1 [drp] via OPHTHALMIC
  Filled 2016-08-06 (×5): qty 1

## 2016-08-06 MED ORDER — POLYVINYL ALCOHOL 1.4 % OP SOLN
Freq: Every day | OPHTHALMIC | Status: DC | PRN
  Administered 2016-08-06: 17:00:00 via OPHTHALMIC
  Filled 2016-08-06: qty 15

## 2016-08-06 MED ORDER — ENOXAPARIN SODIUM 40 MG/0.4ML ~~LOC~~ SOLN
40.0000 mg | Freq: Every day | SUBCUTANEOUS | Status: DC
  Administered 2016-08-06 – 2016-08-07 (×2): 40 mg via SUBCUTANEOUS
  Filled 2016-08-06 (×2): qty 0.4

## 2016-08-06 MED ORDER — ASPIRIN EC 81 MG PO TBEC
81.0000 mg | DELAYED_RELEASE_TABLET | Freq: Every day | ORAL | Status: DC
  Administered 2016-08-06 – 2016-08-07 (×2): 81 mg via ORAL
  Filled 2016-08-06 (×2): qty 1

## 2016-08-06 MED ORDER — ACETAMINOPHEN 325 MG PO TABS
650.0000 mg | ORAL_TABLET | Freq: Four times a day (QID) | ORAL | Status: DC | PRN
  Administered 2016-08-06: 650 mg via ORAL
  Filled 2016-08-06: qty 2

## 2016-08-06 MED ORDER — ACETAMINOPHEN 650 MG RE SUPP: 650.0000 mg | Freq: Four times a day (QID) | RECTAL | Status: DC | PRN

## 2016-08-06 MED ORDER — FUROSEMIDE 20 MG PO TABS
20.0000 mg | ORAL_TABLET | Freq: Every day | ORAL | Status: DC
  Administered 2016-08-06 – 2016-08-07 (×2): 20 mg via ORAL
  Filled 2016-08-06 (×2): qty 1

## 2016-08-06 MED ORDER — POTASSIUM CHLORIDE CRYS ER 20 MEQ PO TBCR
20.0000 meq | EXTENDED_RELEASE_TABLET | ORAL | Status: DC
  Administered 2016-08-06: 20 meq via ORAL
  Filled 2016-08-06: qty 1

## 2016-08-06 MED ORDER — DILTIAZEM HCL ER COATED BEADS 180 MG PO CP24
180.0000 mg | ORAL_CAPSULE | Freq: Every day | ORAL | Status: DC
  Administered 2016-08-06 – 2016-08-07 (×2): 180 mg via ORAL
  Filled 2016-08-06 (×2): qty 1

## 2016-08-06 MED ORDER — PANTOPRAZOLE SODIUM 40 MG PO TBEC
40.0000 mg | DELAYED_RELEASE_TABLET | Freq: Every day | ORAL | Status: DC
  Administered 2016-08-06 – 2016-08-07 (×2): 40 mg via ORAL
  Filled 2016-08-06 (×2): qty 1

## 2016-08-06 MED ORDER — ATORVASTATIN CALCIUM 40 MG PO TABS
80.0000 mg | ORAL_TABLET | Freq: Every day | ORAL | Status: DC
  Administered 2016-08-06: 80 mg via ORAL
  Filled 2016-08-06: qty 2

## 2016-08-06 MED ORDER — SODIUM CHLORIDE 0.9 % IV SOLN
INTRAVENOUS | Status: DC
  Administered 2016-08-06: 10:00:00 via INTRAVENOUS

## 2016-08-06 MED ORDER — ACETAMINOPHEN 325 MG PO TABS
650.0000 mg | ORAL_TABLET | Freq: Two times a day (BID) | ORAL | Status: DC
  Administered 2016-08-06 – 2016-08-07 (×3): 650 mg via ORAL
  Filled 2016-08-06 (×3): qty 2

## 2016-08-06 MED ORDER — ALBUTEROL SULFATE (2.5 MG/3ML) 0.083% IN NEBU: 2.5000 mg | INHALATION_SOLUTION | Freq: Four times a day (QID) | RESPIRATORY_TRACT | Status: DC | PRN

## 2016-08-06 MED ORDER — UMECLIDINIUM-VILANTEROL 62.5-25 MCG/INH IN AEPB
1.0000 | INHALATION_SPRAY | Freq: Every day | RESPIRATORY_TRACT | Status: DC
  Filled 2016-08-06: qty 14

## 2016-08-06 NOTE — ED Provider Notes (Signed)
[facility] DEPT Provider Note   CSN: [id number] Arrival date & time: 08/06/16  0252  Time seen 03:00 AM   History   Chief Complaint Chief Complaint  Patient presents with  . Knee Pain    HPI Wendy Perkins is a 82 y.o. female.  HPI  patient presents emergency department via EMS from home. She reports she was trying to get up to use the bedside commode and her legs gave out and she slid down the bed to the floor. She states she landed in a kneeling position. She states she was having pain in her right knee but it feels better now. She also complains of some chronic back pain. She denies twisting her body, hitting her head, having loss of consciousness. She states she was feeling fine now. She states her granddaughter was unable to get her up off the floor so she pressed her life alert button and EMS was contacted.  PCP Dr. Luan Pulling  Past Medical History:  Diagnosis Date  . Atrial fibrillation ([place])   . Breast nodule 11/15/2014  . Breast pain, left 11/15/2014  . Carotid artery disease ([place])   . Dizziness   . Dyspnea    Previous CPX suggesting possible restrictive physiology, respiratory muscle fatigue, diastolic dysfunction  . Essential hypertension, benign   . Hyperlipidemia   . Oxygen dependent    2 liter  . Rib pain on left side 11/15/2014  . Seasonal allergies   . Shingles 05/04/2015  . Toe fracture, left    left big toe    Patient Active Problem List   Diagnosis Date Noted  . Knee pain, acute 08/06/2016  . Embolic stroke ([place]) [id number]  . COPD (chronic obstructive pulmonary disease) ([place]) 10/17/2015  . UTI (urinary tract infection) 10/17/2015  . Altered mental state 10/13/2015  . Elevated troponin 10/13/2015  . Shingles 05/04/2015  . Rib pain on left side 11/15/2014  . Breast pain, left 11/15/2014  . Breast nodule 11/15/2014  . Orthostatic hypotension 09/30/2013  . Atrial fibrillation ([place]) 11/23/2011  . Chronic diastolic heart failure ([place]) 11/23/2011  .  Essential hypertension, benign 07/13/2010  . DYSPNEA 11/15/2009    Past Surgical History:  Procedure Laterality Date  . COLONOSCOPY  02/22/2012   Procedure: COLONOSCOPY;  Surgeon: Rogene Houston, MD;  Location: [facility];  Service: Endoscopy;  Laterality: N/A;  100  . KNEE SURGERY     bilateral    OB History    Gravida Para Term Preterm AB Living   4 4       4    SAB TAB Ectopic Multiple Live Births                   Home Medications    Prior to Admission medications   Medication Sig Start Date End Date Taking? Authorizing Provider  acetaminophen (TYLENOL) 650 MG CR tablet Take 650 mg by mouth 2 (two) times daily.    Historical Provider, MD  ANORO ELLIPTA 62.5-25 MCG/INH AEPB Inhale 1 Inhaler into the lungs daily.  10/12/14   Historical Provider, MD  aspirin 81 MG tablet Take 81 mg by mouth daily.    Historical Provider, MD  atorvastatin (LIPITOR) 80 MG tablet Take 1 tablet (80 mg total) by mouth daily at 6 PM. 10/17/15   Sinda Du, MD  cycloSPORINE (RESTASIS) 0.05 % ophthalmic emulsion Place 1 drop into both eyes every 12 (twelve) hours.     Historical Provider, MD  diltiazem (CARDIZEM CD) 180 MG 24  hr capsule TAKE ONE CAPSULE BY MOUTH ONCE DAILY. 05/10/15   Satira Sark, MD  fluticasone (FLONASE) 50 MCG/ACT nasal spray Place 2 sprays into the nose daily as needed for allergies. Reported on 06/27/2016    Historical Provider, MD  furosemide (LASIX) 20 MG tablet TAKE 1 TABLET BY MOUTH DAILY. 04/25/15   Satira Sark, MD  Multiple Vitamin (MULITIVITAMIN WITH MINERALS) TABS Take 1 tablet by mouth. Takes 1 tab 2-3 times a week    Historical Provider, MD  omeprazole (PRILOSEC) 20 MG capsule Take 20 mg by mouth daily.      Historical Provider, MD  Polyethyl Glycol-Propyl Glycol (SYSTANE OP) Apply 1 drop to eye daily. For dry eyes    Historical Provider, MD  potassium chloride SA (K-DUR,KLOR-CON) 20 MEQ tablet TAKE ONE TABLET BY MOUTH EVERY OTHER DAY. TAKE ONE THE DAY YOU  TAKE FUROSEMIDE. 06/14/15   Lendon Colonel, NP  PROAIR HFA 108 (90 BASE) MCG/ACT inhaler 2 puffs 3 (three) times daily as needed for wheezing (cough).  05/13/15   Historical Provider, MD    Family History Family History  Problem Relation Age of Onset  . Stroke Mother   . Pneumonia Father   . Heart disease Father   . Healthy Son   . Glaucoma Daughter   . Emphysema Daughter   . Healthy Daughter   . Other Daughter     had knee replacement  . Healthy Son   . Heart disease Maternal Grandmother     Social History Social History  Substance Use Topics  . Smoking status: Never Smoker  . Smokeless tobacco: Never Used  . Alcohol use No  states she was living home alone, but her GD lives with her now Was using a walker, but is now in a wheelchair   Allergies   Sulfur   Review of Systems Review of Systems  All other systems reviewed and are negative.    Physical Exam Updated Vital Signs BP 160/68   Pulse 63   Temp 97.8 F (36.6 C) (Oral)   Resp 18   Ht 5\' 2"  (1.575 m)   Wt 165 lb (74.8 kg)   SpO2 96%   BMI 30.18 kg/m   Vital signs normal    Physical Exam  Constitutional: She is oriented to person, place, and time. She appears well-developed and well-nourished.  Non-toxic appearance. She does not appear ill. No distress.  HENT:  Head: Normocephalic and atraumatic.  Right Ear: External ear normal.  Left Ear: External ear normal.  Nose: Nose normal. No mucosal edema or rhinorrhea.  Mouth/Throat: Oropharynx is clear and moist and mucous membranes are normal. No dental abscesses or uvula swelling.  Eyes: Conjunctivae and EOM are normal. Pupils are equal, round, and reactive to light.  Neck: Normal range of motion and full passive range of motion without pain. Neck supple.  Cardiovascular: Normal rate, regular rhythm and normal heart sounds.  Exam reveals no gallop and no friction rub.   No murmur heard. Pulmonary/Chest: Effort normal and breath sounds normal. No  respiratory distress. She has no wheezes. She has no rhonchi. She has no rales. She exhibits no tenderness and no crepitus.  Abdominal: Soft. Normal appearance and bowel sounds are normal. She exhibits no distension. There is no tenderness. There is no rebound and no guarding.  Musculoskeletal: Normal range of motion. She exhibits tenderness. She exhibits no edema.  Patient moves her upper extremities well. She is noted to have bilateral surgical scars on  her knees consistent with knee replacement. Her right knee is diffusely enlarged compared to the left, she has some mild tenderness, she is unsure if her knee is more swollen than normal. She states it normally is large. She also has some tenderness to palpation of her right hip. She has a pillow underneath her knee so her right leg appears to be shorter than her left.  Neurological: She is alert and oriented to person, place, and time. She has normal strength. No cranial nerve deficit.  Skin: Skin is warm, dry and intact. No rash noted. No erythema. No pallor.  Psychiatric: She has a normal mood and affect. Her speech is normal and behavior is normal. Her mood appears not anxious.  Nursing note and vitals reviewed.    ED Treatments / Results  Labs (all labs ordered are listed, but only abnormal results are displayed) Labs Reviewed  COMPREHENSIVE METABOLIC PANEL - Abnormal; Notable for the following:       Result Value   Glucose, Bld 108 (*)    Total Protein 6.3 (*)    All other components within normal limits  CBC WITH DIFFERENTIAL/PLATELET - Abnormal; Notable for the following:    Monocytes Absolute 1.2 (*)    All other components within normal limits  URINALYSIS, ROUTINE W REFLEX MICROSCOPIC (NOT AT [facility]) - Abnormal; Notable for the following:    Specific Gravity, Urine <1.005 (*)    All other components within normal limits    EKG  EKG Interpretation None       Radiology Dg Knee Complete 4 Views Right  Result Date:  08/06/2016 CLINICAL DATA:  [name] out of bed tonight, landing on right knee. EXAM: RIGHT KNEE - COMPLETE 4+ VIEW COMPARISON:  None. FINDINGS: There is bony fragmentation at the medial and lateral condyles, probably acute fractures. The arthroplasty hardware appears grossly intact. No dislocation. Proximal tibia and proximal fibula appear intact. Femoral diaphysis appears intact. IMPRESSION: Fragmentation at the femoral condyles, suspicious for acute fractures. Electronically Signed   By: Andreas Newport M.D.   On: 08/06/2016 03:45   Dg Hip Unilat W Or Wo Pelvis 2-3 Views Right  Result Date: 08/06/2016 CLINICAL DATA:  [name] out of bed. EXAM: DG HIP (WITH OR WITHOUT PELVIS) 2-3V RIGHT COMPARISON:  None. FINDINGS: Diffuse bone demineralization. Pelvis demonstrates no evidence of acute fracture or dislocation. SI joints and symphysis pubis are not displaced. Right hip demonstrates no evidence of acute fracture or dislocation. No focal bone lesion or bone destruction. Bone cortex appears intact. Mild degenerative changes. Vascular calcifications. IMPRESSION: No acute fracture or dislocation demonstrated in the pelvis or right hip Electronically Signed   By: Lucienne Capers M.D.   On: 08/06/2016 03:45    Procedures Procedures (including critical care time)  Medications Ordered in ED Medications - No data to display   Initial Impression / Assessment and Plan / ED Course  I have reviewed the triage vital signs and the nursing notes.  Pertinent labs & imaging results that were available during my care of the patient were reviewed by me and considered in my medical decision making (see chart for details).  Clinical Course   Recheck after her x-rays resulted, patient states her knee is not hurting as bad as it was. She is able to flex it slightly and states it doesn't hurt anymore than normal. I had considered doing a CT of her right knee however due to all the hardware she has I felt there would be too  much  artifact and we would not see what we need to see. I talked to her and her son about getting MRI however is not available until later this morning. At this point her son states he feels like he can't take care of her at home anymore. He states recently she has been unsteady on her feet and her knees give out on her. She is unable to assist them getting her up anymore. He wants her admitted and wants her to be placed in a facility. Lab work was ordered.  Review of old notes shows she has seen Dr Aline Brochure, ortho, in 2009 for carpal tunnel surgery and in 2011 for her back pain.   06:17 AM Dr Darrick Meigs, hospitalist, will admit to get MRI of her right knee and to start PT, admit to obs, Dr Luan Pulling, attending  Final Clinical Impressions(s) / ED Diagnoses   Final diagnoses:  Fall at home, initial encounter  Knee pain, right  Weakness   Plan admission  Rolland Porter, MD, Barbette Or, MD 08/06/16 [phone]

## 2016-08-06 NOTE — ED Triage Notes (Signed)
Pt slid off bed, landing in floor, c/o right knee pain,

## 2016-08-06 NOTE — Care Management Note (Signed)
Case Management Note  Patient Details  Name: Wendy Perkins MRN: [id number] Date of Birth: 12-Aug-1919                    Pt admitted after falling and hurting her knee. Pt is alert and oriented, son is at bedside. Pt has another son who is POA. She also has 2 daughters who live out of town. Pt states she mostly uses her wheelchair for mobility. She has a lift chair and BSC at home. She has a niece who she lives with but who is will start to work 2nd shift soon and pt will go long periods with no supervision. Pt has no formal [place] services. Per son family has looked into [employer] caregivers but pt/family unable to afford $10/hr care. Per son pt does not own her home. She has $2000/month income and that has been insufficient for pt's expensices, family has had to help pay for groceries in the past month. Pt is hopefull she can find a way to stay in her home, pt's sons are hopeful they can talk her into placement. STR vs LTC vs ALF discussed with son. He has requested CSW discuss further with pt and POA. He also mentions pt's husband was a veteran and they are in the process of determining if pt qualifies for any assistance. This will not happen during this hospitalization.   Expected Discharge Date:      08/07/2016            Expected Discharge Plan:  [place]  In-House Referral:  Clinical Social Work  Discharge planning Services  CM Consult  Post Acute Care Choice:    Choice offered to:     DME Arranged:    DME Agency:     HH Arranged:    [place] Agency:     Status of Service:  In process, will continue to follow  If discussed at Long Length of Stay Meetings, dates discussed:    Additional Comments:  Sherald Barge, RN 08/06/2016, 1:49 PM

## 2016-08-06 NOTE — ED Notes (Signed)
Pt reports going to the bathroom and "slid out of the bed" onto her R knee. The knee is swollen and she reports that she has had a knee replacement to her knee. She is hoh but answers appropriately.   Ice to knee - awaiting radiology and eval

## 2016-08-06 NOTE — ED Notes (Signed)
Lab in to draw

## 2016-08-06 NOTE — Progress Notes (Signed)
This is an assumption of care note. She is a 82 year old who's had multiple falls at home. She hurt her knee and is scheduled for MRI. I've gone ahead and requested orthopedic and physical therapy consultation but depending on results of MRI that may change.

## 2016-08-06 NOTE — Evaluation (Signed)
Physical Therapy Evaluation Patient Details Name: Wendy Perkins MRN: [id number] DOB: [birth year] Today's Date: 08/06/2016   History of Present Illness  82 y.o. female, *With history of CAD, hypertension, atrial fibrillation was brought to the ED after patient fell at home on her knees. As per son patient was trying to get out of bed to use bedside commode and legs gave out and she slid down the bed onto the floor. Patient ended in the kneeling position, her right knee.  X-ray of the knee was done which showed fragmented bone around the femoral condyle suspicious for acute fracture.  CT of the R knee done today shows: No obvious acute fractures are identified. The prosthetic components are intact. Suspect small joint effusion. PMH: Afib, breast nodule, CAD, dizziness, dyspnea, HTN, O2 dependent on 2L, Rib pani on the L, shingles, L great toe fx, B TKA, carpal tunnel surgery, and back pain.   Clinical Impression  Pt received in bed, son present, and pt is agreeable to PT evaluation.  Son reports that for the last 5 weeks pt has been using a w/c only due to her last fall.  She is normally independent with her transfers, and ADL's.  Today, pt required Min/Mod A for SPT from bed<>chair with patient facing the chair.  She demonstrates poor eccentric control when lowering down into the chair.  She has had multiple falls in the last 6 months and continues to be a high risk for falls.  Therefore, she would benefit SNF to build her strength, balance, and transfers prior to returning home.      Follow Up Recommendations SNF    Equipment Recommendations  None recommended by PT    Recommendations for Other Services       Precautions / Restrictions Precautions Precautions: Fall Precaution Comments: reason for admission.  Restrictions Weight Bearing Restrictions:  (Per CT - there is no acute fx, therefore, should be WBAT. )      Mobility  Bed Mobility Overal bed mobility: Modified Independent                 Transfers Overall transfer level: Needs assistance Equipment used: None Transfers: Stand Pivot Transfers   Stand pivot transfers: Min assist;Mod assist (trial x 3 to and from the bed, with chair facing the bed.  Pt demonstrates poor eccentric control down into the chair, and lands quite hard.  )          Ambulation/Gait Ambulation/Gait assistance:  (NA - pt has been non-ambulatory for ~5 weeks now due to previous fall where she states she injured her R knee. )              Stairs            Wheelchair Mobility    Modified Rankin (Stroke Patients Only)       Balance Overall balance assessment: Needs assistance         Standing balance support: Bilateral upper extremity supported Standing balance-Leahy Scale: Fair                               Pertinent Vitals/Pain Pain Assessment: 0-10 Pain Score: 4  Pain Location: R knee - jerking Pain Descriptors / Indicators: Shooting Pain Intervention(s): Limited activity within patient's tolerance;Monitored during session    Home Living   Living Arrangements:  (granddaughter - goes to work 3-11.  ) Available Help at Discharge: Family;Friend(s);Available PRN/intermittently Type of Home: House  Home Access: Stairs to enter   Entrance Stairs-Number of Steps: 1 in the back, and 2 in the front.  Home Layout: One level Home Equipment: Walker - 2 wheels;Wheelchair - manual;Cane - single point;Bedside commode;Grab bars - tub/shower;Tub bench (lift chair. )      Prior Function     Gait / Transfers Assistance Needed: Son states that pt has just been using the w/c recently.    ADL's / Homemaking Assistance Needed: Son - Pt requires assistance for dressing and bathing.  Pt states she is independent with it.          Hand Dominance   Dominant Hand: Right    Extremity/Trunk Assessment   Upper Extremity Assessment: Generalized weakness           Lower Extremity Assessment:  Generalized weakness         Communication   Communication: HOH  Cognition Arousal/Alertness: Awake/alert Behavior During Therapy: WFL for tasks assessed/performed Overall Cognitive Status: Within Functional Limits for tasks assessed                      General Comments      Exercises        Assessment/Plan    PT Assessment Patient needs continued PT services  PT Diagnosis Generalized weakness   PT Problem List Decreased strength;Decreased mobility;Decreased activity tolerance;Decreased balance;Pain;Decreased knowledge of use of DME;Decreased safety awareness  PT Treatment Interventions DME instruction;Functional mobility training;Therapeutic activities;Therapeutic exercise;Balance training;Patient/family education   PT Goals (Current goals can be found in the Care Plan section) Acute Rehab PT Goals Patient Stated Goal: Pt get stronger.  PT Goal Formulation: With patient/family Time For Goal Achievement: 08/13/16 Potential to Achieve Goals: Fair    Frequency     Barriers to discharge Decreased caregiver support;Inaccessible home environment Although her granddaughter current lives with her, granddaughter will be starting work in the next few weeks, and will therefore, not be able to provide 24/7 supervision.      Co-evaluation               End of Session Equipment Utilized During Treatment: Gait belt Activity Tolerance: Patient tolerated treatment well Patient left: in chair;with call bell/phone within reach Nurse Communication: Mobility status    Functional Assessment Tool Used: [employer] "6-clicks"  Functional Limitation: Mobility: Walking and moving around Mobility: Walking and Moving Around Current Status [phone]): At least 40 percent but less than 60 percent impaired, limited or restricted Mobility: Walking and Moving Around Goal Status [phone]): At least 20 percent but less than 40 percent impaired, limited or restricted     Time: 1459-1532 PT Time Calculation (min) (ACUTE ONLY): 33 min   Charges:   PT Evaluation $PT Eval Moderate Complexity: 1 Procedure PT Treatments $Therapeutic Activity: 8-22 mins   PT G Codes:   PT G-Codes **NOT FOR INPATIENT CLASS** Functional Assessment Tool Used: [employer] "6-clicks"  Functional Limitation: Mobility: Walking and moving around Mobility: Walking and Moving Around Current Status [phone]): At least 40 percent but less than 60 percent impaired, limited or restricted Mobility: Walking and Moving Around Goal Status [phone]): At least 20 percent but less than 40 percent impaired, limited or restricted    [place] Defina, PT, DPT X: [phone]

## 2016-08-06 NOTE — H&P (Signed)
TRH H&P   Patient Demographics:    Wendy Perkins, is a 82 y.o. female  MRN: [id number]  DOB - 1919-07-12  Admit Date - 08/06/2016  Outpatient Primary MD for the patient is Alonza Bogus, MD  Referring MD/NP/PA: Dr. Tomi Bamberger  Patient coming from: Home  Chief Complaint  Patient presents with  . Knee Pain      HPI:    Wendy Perkins  is a 81 y.o. female, *With history of CAD, hypertension, atrial fibrillation was brought to the ED after patient fell at home on her knees. As per son patient was kind of get out of bed to use bedside commode and legs gave out and she slid down the bed onto the floor. Patient ended in the kneeling position, her right knee. She denies passing out. Denies hitting her head.  X-ray of the knee was done which showed fragmented bone around the femoral condyle suspicious for acute fracture. Patient has remote history of bilateral knee replacement.   Review of systems:    In addition to the HPI above,  No Fever-chills, No Headache, No changes with Vision or hearing, No problems swallowing food or Liquids, No Chest pain, Cough or Shortness of Breath, No Abdominal pain, No Nausea or Vomiting, bowel movements are regular, No Blood in stool or Urine, No dysuria, No new skin rashes or bruises,  No new weakness, tingling, numbness in any extremity, No recent weight gain or loss, No polyuria, polydypsia or polyphagia, No significant Mental Stressors.  A full 10 point Review of Systems was done, except as stated above, all other Review of Systems were negative.   With Past History of the following :    Past Medical History:  Diagnosis Date  . Atrial fibrillation ([place])   . Breast nodule 11/15/2014  . Breast pain, left 11/15/2014  . Carotid artery disease ([place])   . Dizziness   . Dyspnea    Previous CPX suggesting possible restrictive physiology, respiratory muscle fatigue,  diastolic dysfunction  . Essential hypertension, benign   . Hyperlipidemia   . Oxygen dependent    2 liter  . Rib pain on left side 11/15/2014  . Seasonal allergies   . Shingles 05/04/2015  . Toe fracture, left    left big toe      Past Surgical History:  Procedure Laterality Date  . COLONOSCOPY  02/22/2012   Procedure: COLONOSCOPY;  Surgeon: Rogene Houston, MD;  Location: [facility];  Service: Endoscopy;  Laterality: N/A;  100  . KNEE SURGERY     bilateral      Social History:     Social History  Substance Use Topics  . Smoking status: Never Smoker  . Smokeless tobacco: Never Used  . Alcohol use No        Family History :     Family History  Problem Relation Age of Onset  . Stroke Mother   . Pneumonia Father   . Heart disease Father   . Healthy Son   .  Glaucoma Daughter   . Emphysema Daughter   . Healthy Daughter   . Other Daughter     had knee replacement  . Healthy Son   . Heart disease Maternal Grandmother       Home Medications:   Prior to Admission medications   Medication Sig Start Date End Date Taking? Authorizing Provider  acetaminophen (TYLENOL) 650 MG CR tablet Take 650 mg by mouth 2 (two) times daily.    Historical Provider, MD  ANORO ELLIPTA 62.5-25 MCG/INH AEPB Inhale 1 Inhaler into the lungs daily.  10/12/14   Historical Provider, MD  aspirin 81 MG tablet Take 81 mg by mouth daily.    Historical Provider, MD  atorvastatin (LIPITOR) 80 MG tablet Take 1 tablet (80 mg total) by mouth daily at 6 PM. 10/17/15   Kari Baars, MD  cycloSPORINE (RESTASIS) 0.05 % ophthalmic emulsion Place 1 drop into both eyes every 12 (twelve) hours.     Historical Provider, MD  diltiazem (CARDIZEM CD) 180 MG 24 hr capsule TAKE ONE CAPSULE BY MOUTH ONCE DAILY. 05/10/15   Jonelle Sidle, MD  fluticasone (FLONASE) 50 MCG/ACT nasal spray Place 2 sprays into the nose daily as needed for allergies. Reported on 06/27/2016    Historical Provider, MD  furosemide (LASIX)  20 MG tablet TAKE 1 TABLET BY MOUTH DAILY. 04/25/15   Jonelle Sidle, MD  Multiple Vitamin (MULITIVITAMIN WITH MINERALS) TABS Take 1 tablet by mouth. Takes 1 tab 2-3 times a week    Historical Provider, MD  omeprazole (PRILOSEC) 20 MG capsule Take 20 mg by mouth daily.      Historical Provider, MD  Polyethyl Glycol-Propyl Glycol (SYSTANE OP) Apply 1 drop to eye daily. For dry eyes    Historical Provider, MD  potassium chloride SA (K-DUR,KLOR-CON) 20 MEQ tablet TAKE ONE TABLET BY MOUTH EVERY OTHER DAY. TAKE ONE THE DAY YOU TAKE FUROSEMIDE. 06/14/15   Jodelle Gross, NP  PROAIR HFA 108 (90 BASE) MCG/ACT inhaler 2 puffs 3 (three) times daily as needed for wheezing (cough).  05/13/15   Historical Provider, MD     Allergies:     Allergies  Allergen Reactions  . Sulfur Swelling    Rash also per patient     Physical Exam:   Vitals  Blood pressure 142/59, pulse 67, temperature 97.1 F (36.2 C), temperature source Oral, resp. rate 18, height 5\' 2"  (1.575 m), weight 74.8 kg (165 lb), SpO2 98 %.   1. General Elderly female* lying in bed in NAD, cooperative with exam  2. Normal affect and insight, Awake Alert, Oriented X 3.  3. No F.N deficits, ALL C.Nerves Intact, Strength 5/5 all 4 extremities, Sensation intact all 4 extremities, Plantars down going.  4. Ears and Eyes appear Normal, Conjunctivae clear, PERRLA. Moist Oral Mucosa.  5. Supple Neck, No JVD, No cervical lymphadenopathy appriciated, No Carotid Bruits.  6. Symmetrical Chest wall movement, Good air movement bilaterally, CTAB.  7. RRR, No Gallops, Rubs or Murmurs, No Parasternal Heave.No Leg edema  8. Positive Bowel Sounds, Abdomen Soft, No tenderness, No organomegaly appriciated,No rebound -guarding or rigidity.  9.  No Cyanosis, Normal Skin Turgor, No Skin Rash or Bruise.  10. Good muscle tone, right knee joint is swollen, mild tenderness to palpation      Data Review:    CBC  Recent Labs Lab 08/06/16 0502    WBC 8.1  HGB 13.0  HCT 39.9  PLT 242  MCV 87.7  MCH 28.6  MCHC  32.6  RDW 13.8  LYMPHSABS 1.3  MONOABS 1.2*  EOSABS 0.2  BASOSABS 0.0   ------------------------------------------------------------------------------------------------------------------  Chemistries   Recent Labs Lab 08/06/16 0502  NA 138  K 3.5  CL 105  CO2 25  GLUCOSE 108*  BUN 16  CREATININE 0.58  CALCIUM 9.0  AST 20  ALT 17  ALKPHOS 87  BILITOT 0.9   ------------------------------------------------------------------------------------------------------------------  ------------------------------------------------------------------------------------------------------------------ GFR: Estimated Creatinine Clearance: 39.8 mL/min (by C-G formula based on SCr of 0.8 mg/dL). Liver Function Tests:  Recent Labs Lab 08/06/16 0502  AST 20  ALT 17  ALKPHOS 87  BILITOT 0.9  PROT 6.3*  ALBUMIN 3.6    --------------------------------------------------------------------------------------------------------------- Urine analysis:    Component Value Date/Time   COLORURINE YELLOW 08/06/2016 0450   APPEARANCEUR CLEAR 08/06/2016 0450   LABSPEC <1.005 (L) 08/06/2016 0450   PHURINE 6.0 08/06/2016 0450   GLUCOSEU NEGATIVE 08/06/2016 0450   HGBUR NEGATIVE 08/06/2016 0450   BILIRUBINUR NEGATIVE 08/06/2016 0450   KETONESUR NEGATIVE 08/06/2016 0450   PROTEINUR NEGATIVE 08/06/2016 0450   UROBILINOGEN 1.0 10/13/2015 1351   NITRITE NEGATIVE 08/06/2016 0450   LEUKOCYTESUR NEGATIVE 08/06/2016 0450      ----------------------------------------------------------------------------------------------------------------   Imaging Results:    Dg Knee Complete 4 Views Right  Result Date: 08/06/2016 CLINICAL DATA:  [name] out of bed tonight, landing on right knee. EXAM: RIGHT KNEE - COMPLETE 4+ VIEW COMPARISON:  None. FINDINGS: There is bony fragmentation at the medial and lateral condyles, probably acute fractures.  The arthroplasty hardware appears grossly intact. No dislocation. Proximal tibia and proximal fibula appear intact. Femoral diaphysis appears intact. IMPRESSION: Fragmentation at the femoral condyles, suspicious for acute fractures. Electronically Signed   By: Andreas Newport M.D.   On: 08/06/2016 03:45   Dg Hip Unilat W Or Wo Pelvis 2-3 Views Right  Result Date: 08/06/2016 CLINICAL DATA:  [name] out of bed. EXAM: DG HIP (WITH OR WITHOUT PELVIS) 2-3V RIGHT COMPARISON:  None. FINDINGS: Diffuse bone demineralization. Pelvis demonstrates no evidence of acute fracture or dislocation. SI joints and symphysis pubis are not displaced. Right hip demonstrates no evidence of acute fracture or dislocation. No focal bone lesion or bone destruction. Bone cortex appears intact. Mild degenerative changes. Vascular calcifications. IMPRESSION: No acute fracture or dislocation demonstrated in the pelvis or right hip Electronically Signed   By: Lucienne Capers M.D.   On: 08/06/2016 03:45       Assessment & Plan:    Active Problems:   Essential hypertension, benign   Knee pain, acute   Fall   1. Right knee pain/swelling- x-ray of the knee shows fragmentation around the femoral condyles, will obtain MRI of the knee to rule out fracture.  2. Frequent falls- as per son patient has been getting weaker at home, with frequent falls.If MRI negative patient will benefit from physical therapy evaluation.  3. Atrial fibrillation- continue Cardizem, aspirin 4. COPD- stable   DVT Prophylaxis-   Lovenox   AM Labs Ordered, also please review Full Orders  Family Communication: Admission, patients condition and plan of care including tests being ordered have been discussed with the patient and her son at bedside* who indicate understanding and agree with the plan and Code Status.  Code Status:  DO NOT RESUSCITATE  Admission status: Observation    Time spent in minutes : 60 minutes   Kiari Hosmer S M.D on 08/06/2016 at  6:37 AM  Between 7am to 7pm - Pager - [phone]. After 7pm go to [email or web address] - password [facility]  [facility] - Office  [phone]

## 2016-08-06 NOTE — ED Notes (Signed)
Hospitalist in to see pt.

## 2016-08-06 NOTE — Progress Notes (Signed)
PT Cancellation Note  Patient Details Name: ROSS SZABO MRN: [id number] DOB: 1919-04-23   Cancelled Treatment:    Reason Eval/Treat Not Completed: Other (comment) (PT evaluation orders received with X-ray of the R knee showing fragmented bone around the femoral condyle suspicious for acute fracture.  Awaiting MRI results, as well as weight bearing status.  Thank you!)   Eustaquio Maize Deiondre Harrower, PT, DPT X: [phone]

## 2016-08-06 NOTE — Care Management Obs Status (Signed)
[place] NOTIFICATION   Patient Details  Name: Wendy Perkins MRN: [id number] Date of Birth: 1919/11/09   Medicare Observation Status Notification Given:  Yes    Sherald Barge, RN 08/06/2016, 1:47 PM

## 2016-08-06 NOTE — ED Notes (Signed)
From xray

## 2016-08-07 ENCOUNTER — Inpatient Hospital Stay
Admission: RE | Admit: 2016-08-07 | Discharge: 2016-12-06 | Disposition: A | Discharge status: Nursing Home (Includes ICF) | Payer: Medicare Other | Source: Ambulatory Visit | Attending: Internal Medicine | Admitting: Internal Medicine

## 2016-08-07 DIAGNOSIS — I951 Orthostatic hypotension: Secondary | ICD-10-CM | POA: Diagnosis not present

## 2016-08-07 DIAGNOSIS — J449 Chronic obstructive pulmonary disease, unspecified: Secondary | ICD-10-CM | POA: Diagnosis not present

## 2016-08-07 DIAGNOSIS — I2510 Atherosclerotic heart disease of native coronary artery without angina pectoris: Secondary | ICD-10-CM | POA: Diagnosis not present

## 2016-08-07 DIAGNOSIS — Z9981 Dependence on supplemental oxygen: Secondary | ICD-10-CM | POA: Diagnosis not present

## 2016-08-07 DIAGNOSIS — J302 Other seasonal allergic rhinitis: Secondary | ICD-10-CM | POA: Diagnosis not present

## 2016-08-07 DIAGNOSIS — M549 Dorsalgia, unspecified: Secondary | ICD-10-CM | POA: Diagnosis not present

## 2016-08-07 DIAGNOSIS — T84498A Other mechanical complication of other internal orthopedic devices, implants and grafts, initial encounter: Secondary | ICD-10-CM | POA: Diagnosis not present

## 2016-08-07 DIAGNOSIS — I5032 Chronic diastolic (congestive) heart failure: Secondary | ICD-10-CM | POA: Diagnosis not present

## 2016-08-07 DIAGNOSIS — R293 Abnormal posture: Secondary | ICD-10-CM | POA: Diagnosis not present

## 2016-08-07 DIAGNOSIS — Z79899 Other long term (current) drug therapy: Secondary | ICD-10-CM | POA: Diagnosis not present

## 2016-08-07 DIAGNOSIS — G8929 Other chronic pain: Secondary | ICD-10-CM | POA: Diagnosis not present

## 2016-08-07 DIAGNOSIS — I110 Hypertensive heart disease with heart failure: Secondary | ICD-10-CM | POA: Diagnosis not present

## 2016-08-07 DIAGNOSIS — E785 Hyperlipidemia, unspecified: Secondary | ICD-10-CM | POA: Diagnosis not present

## 2016-08-07 DIAGNOSIS — R0602 Shortness of breath: Secondary | ICD-10-CM | POA: Diagnosis not present

## 2016-08-07 DIAGNOSIS — R0902 Hypoxemia: Secondary | ICD-10-CM | POA: Diagnosis not present

## 2016-08-07 DIAGNOSIS — R262 Difficulty in walking, not elsewhere classified: Secondary | ICD-10-CM | POA: Diagnosis not present

## 2016-08-07 DIAGNOSIS — Z9181 History of falling: Secondary | ICD-10-CM | POA: Diagnosis not present

## 2016-08-07 DIAGNOSIS — Z7982 Long term (current) use of aspirin: Secondary | ICD-10-CM | POA: Diagnosis not present

## 2016-08-07 DIAGNOSIS — M6281 Muscle weakness (generalized): Secondary | ICD-10-CM | POA: Diagnosis not present

## 2016-08-07 DIAGNOSIS — M25561 Pain in right knee: Secondary | ICD-10-CM | POA: Diagnosis not present

## 2016-08-07 DIAGNOSIS — I482 Chronic atrial fibrillation: Secondary | ICD-10-CM | POA: Diagnosis not present

## 2016-08-07 DIAGNOSIS — M25569 Pain in unspecified knee: Secondary | ICD-10-CM | POA: Diagnosis not present

## 2016-08-07 DIAGNOSIS — R279 Unspecified lack of coordination: Secondary | ICD-10-CM | POA: Diagnosis not present

## 2016-08-07 DIAGNOSIS — I10 Essential (primary) hypertension: Secondary | ICD-10-CM | POA: Diagnosis not present

## 2016-08-07 LAB — COMPREHENSIVE METABOLIC PANEL
ALBUMIN: 3.2 g/dL — AB (ref 3.5–5.0)
ALK PHOS: 77 U/L (ref 38–126)
ALT: 13 U/L — ABNORMAL LOW (ref 14–54)
ANION GAP: 6 (ref 5–15)
AST: 19 U/L (ref 15–41)
BUN: 15 mg/dL (ref 6–20)
CALCIUM: 8.7 mg/dL — AB (ref 8.9–10.3)
CO2: 26 mmol/L (ref 22–32)
Chloride: 107 mmol/L (ref 101–111)
Creatinine, Ser: 0.64 mg/dL (ref 0.44–1.00)
GFR calc non Af Amer: >60 mL/min (ref >60)
Glucose, Bld: 97 mg/dL (ref 65–99)
POTASSIUM: 3.5 mmol/L (ref 3.5–5.1)
SODIUM: 139 mmol/L (ref 135–145)
Total Bilirubin: 0.8 mg/dL (ref 0.3–1.2)
Total Protein: 5.7 g/dL — ABNORMAL LOW (ref 6.5–8.1)

## 2016-08-07 LAB — CBC
HCT: 38.1 % (ref 36.0–46.0)
HEMOGLOBIN: 12.3 g/dL (ref 12.0–15.0)
MCH: 28.7 pg (ref 26.0–34.0)
MCHC: 32.3 g/dL (ref 30.0–36.0)
MCV: 88.8 fL (ref 78.0–100.0)
PLATELETS: 219 10*3/uL (ref 150–400)
RBC: 4.29 MIL/uL (ref 3.87–5.11)
RDW: 13.9 % (ref 11.5–15.5)
WBC: 7 10*3/uL (ref 4.0–10.5)

## 2016-08-07 NOTE — Care Management Note (Signed)
Case Management Note  Patient Details  Name: Wendy Perkins MRN: [id number] Date of Birth: Jan 19, 1919  Expected Discharge Date:     08/07/2016             Expected Discharge Plan:  [place]  In-House Referral:  Clinical Social Work  Discharge planning Services  CM Consult  Post Acute Care Choice:    Choice offered to:     DME Arranged:    DME Agency:     HH Arranged:    [place] Agency:     Status of Service:  Completed, signed off  If discussed at [employer], dates discussed:    Additional Comments: Pt discharging today. PT has recommended SNF, pt agreeable and CSW has made placement arrangements.   Sherald Barge, RN 08/07/2016, 10:52 AM

## 2016-08-07 NOTE — NC FL2 (Signed)
[place] LEVEL OF CARE SCREENING TOOL     IDENTIFICATION  Patient Name: Wendy Perkins Birthdate: 1919/05/18 Sex: female Admission Date (Current Location): 08/06/2016  [facility] and [place] Number:  [employer] and Address:  [facility] [address], [place]      Provider Number: [id number]  Attending Physician Name and Address:  Sinda Du, MD  Relative Name and Phone Number:       Current Level of Care: Hospital Recommended Level of Care: [place] Prior Approval Number:    Date Approved/Denied:   PASRR Number: [id number]  Discharge Plan: SNF    Current Diagnoses: Patient Active Problem List   Diagnosis Date Noted  . Knee pain, acute 08/06/2016  . Fall 08/06/2016  . Embolic stroke ([place]) [id number]  . COPD (chronic obstructive pulmonary disease) ([place]) 10/17/2015  . UTI (urinary tract infection) 10/17/2015  . Altered mental state 10/13/2015  . Elevated troponin 10/13/2015  . Shingles 05/04/2015  . Rib pain on left side 11/15/2014  . Breast pain, left 11/15/2014  . Breast nodule 11/15/2014  . Orthostatic hypotension 09/30/2013  . Atrial fibrillation ([place]) 11/23/2011  . Chronic diastolic heart failure ([place]) 11/23/2011  . Essential hypertension, benign 07/13/2010  . DYSPNEA 11/15/2009    Orientation RESPIRATION BLADDER Height & Weight     Self, Time, Situation, Place  Normal Continent Weight: 156 lb 3.2 oz (70.9 kg) Height:  5\' 2"  (157.5 cm)  BEHAVIORAL SYMPTOMS/MOOD NEUROLOGICAL BOWEL NUTRITION STATUS  Other (Comment) (n/a)  (n/a) Continent Diet (Heart healthy)  AMBULATORY STATUS COMMUNICATION OF NEEDS Skin   Total Care Verbally Normal                       Personal Care Assistance Level of Assistance  Bathing, Feeding, Dressing Bathing Assistance: Maximum assistance Feeding assistance: Limited assistance Dressing Assistance: Maximum assistance     Functional Limitations Info   Sight, Hearing, Speech Sight Info: Adequate Hearing Info: Impaired Speech Info: Adequate    SPECIAL CARE FACTORS FREQUENCY  PT (By licensed PT)                    Contractures      Additional Factors Info  Code Status, Allergies Code Status Info: DNR Allergies Info: Sulfur           Current Medications (08/07/2016):  This is the current hospital active medication list Current Facility-Administered Medications  Medication Dose Route Frequency Provider Last Rate Last Dose  . 0.9 %  sodium chloride infusion   Intravenous Continuous Oswald Hillock, MD 10 mL/hr at 08/06/16 [id number]    . acetaminophen (TYLENOL) tablet 650 mg  650 mg Oral Q6H PRN Oswald Hillock, MD   650 mg at 08/06/16 1551   Or  . acetaminophen (TYLENOL) suppository 650 mg  650 mg Rectal Q6H PRN Oswald Hillock, MD      . acetaminophen (TYLENOL) tablet 650 mg  650 mg Oral BID Oswald Hillock, MD   650 mg at 08/06/16 2354  . albuterol (PROVENTIL) (2.5 MG/3ML) 0.083% nebulizer solution 2.5 mg  2.5 mg Inhalation Q6H PRN Oswald Hillock, MD      . aspirin EC tablet 81 mg  81 mg Oral Daily Oswald Hillock, MD   81 mg at 08/06/16 1000  . atorvastatin (LIPITOR) tablet 80 mg  80 mg Oral q1800 Oswald Hillock, MD   80 mg at 08/06/16 1714  .  cycloSPORINE (RESTASIS) 0.05 % ophthalmic emulsion 1 drop  1 drop Both Eyes BID Oswald Hillock, MD   1 drop at 08/06/16 2354  . diltiazem (CARDIZEM CD) 24 hr capsule 180 mg  180 mg Oral Daily Oswald Hillock, MD   180 mg at 08/06/16 1000  . enoxaparin (LOVENOX) injection 40 mg  40 mg Subcutaneous Daily Oswald Hillock, MD   40 mg at 08/06/16 1000  . furosemide (LASIX) tablet 20 mg  20 mg Oral Daily Oswald Hillock, MD   20 mg at 08/06/16 1001  . pantoprazole (PROTONIX) EC tablet 40 mg  40 mg Oral Daily Oswald Hillock, MD   40 mg at 08/06/16 1001  . polyvinyl alcohol (LIQUIFILM TEARS) 1.4 % ophthalmic solution   Both Eyes Daily PRN Oswald Hillock, MD      . potassium chloride SA (K-DUR,KLOR-CON) CR tablet 20 mEq  20 mEq  Oral QODAY Oswald Hillock, MD   20 mEq at 08/06/16 1001  . umeclidinium-vilanterol (ANORO ELLIPTA) 62.5-25 MCG/INH 1 puff  1 puff Inhalation Daily Oswald Hillock, MD         Discharge Medications: Please see discharge summary for a list of discharge medications.  Relevant Imaging Results:  Relevant Lab Results:   Additional Information SSN: [id number]  [name], [place]

## 2016-08-07 NOTE — Discharge Summary (Signed)
Physician Discharge Summary  Patient ID: Wendy Perkins MRN: [id number] DOB/AGE: January 11, 1919 82 y.o. Primary Care Physician:Wendy Shingledecker L, MD Admit date: 08/06/2016 Discharge date: 08/07/2016    Discharge Diagnoses:   Active Problems:   Essential hypertension, benign   Atrial fibrillation (HCC)   Chronic diastolic heart failure (HCC)   Orthostatic hypotension   COPD (chronic obstructive pulmonary disease) (HCC)   Knee pain, acute   Fall     Medication List    TAKE these medications   acetaminophen 650 MG CR tablet Commonly known as:  TYLENOL Take 1,300 mg by mouth 3 (three) times daily.   ANORO ELLIPTA 62.5-25 MCG/INH Aepb Generic drug:  umeclidinium-vilanterol Inhale 1 Inhaler into the lungs daily.   aspirin 81 MG tablet Take 81 mg by mouth daily.   atorvastatin 80 MG tablet Commonly known as:  LIPITOR Take 1 tablet (80 mg total) by mouth daily at 6 PM.   cycloSPORINE 0.05 % ophthalmic emulsion Commonly known as:  RESTASIS Place 1 drop into both eyes every 12 (twelve) hours.   diclofenac sodium 1 % Gel Commonly known as:  VOLTAREN Apply 2 g topically 4 (four) times daily as needed for pain.   diltiazem 180 MG 24 hr capsule Commonly known as:  CARDIZEM CD TAKE ONE CAPSULE BY MOUTH ONCE DAILY.   fluticasone 50 MCG/ACT nasal spray Commonly known as:  FLONASE Place 2 sprays into the nose daily as needed for allergies. Reported on 06/27/2016   furosemide 20 MG tablet Commonly known as:  LASIX TAKE 1 TABLET BY MOUTH DAILY.   multivitamin with minerals Tabs tablet Take 1 tablet by mouth every other day. Takes 1 tab 2-3 times a week   omeprazole 20 MG capsule Commonly known as:  PRILOSEC Take 20 mg by mouth daily.   potassium chloride SA 20 MEQ tablet Commonly known as:  K-DUR,KLOR-CON TAKE ONE TABLET BY MOUTH EVERY OTHER DAY. TAKE ONE THE DAY YOU TAKE FUROSEMIDE.   PROAIR HFA 108 (90 Base) MCG/ACT inhaler Generic drug:  albuterol 2 puffs 3 (three)  times daily as needed for wheezing (cough).   PROCTO-MED HC 2.5 % rectal cream Generic drug:  hydrocortisone Place 1 application rectally 4 (four) times daily.   SYSTANE OP Apply 1 drop to eye daily. For dry eyes       Discharged Condition:Improved    Consults: Physical therapy  Significant Diagnostic Studies: Ct Knee Right Wo Contrast  Result Date: 08/06/2016 CLINICAL DATA:  [name] this morning and injured right knee. Possible acute fractures on radiographs. EXAM: CT OF THE right KNEE WITHOUT CONTRAST TECHNIQUE: Multidetector CT imaging of the right knee was performed according to the standard protocol. Multiplanar CT image reconstructions were also generated. COMPARISON:  Radiographs 08/06/2016 FINDINGS: Limited examination due to significant artifact from the knee prosthesis. No obvious acute fractures are identified. The femoral, tibial and patellar prosthesis are intact. No complicating features. Areas of probable heterotopic ossification adjacent to the femoral condyles. Possible small joint effusion. IMPRESSION: No obvious acute fractures are identified. The prosthetic components are intact. Suspect small joint effusion. Electronically Signed   By: Marijo Sanes M.D.   On: 08/06/2016 12:16   Dg Knee Complete 4 Views Right  Result Date: 08/06/2016 CLINICAL DATA:  [name] out of bed tonight, landing on right knee. EXAM: RIGHT KNEE - COMPLETE 4+ VIEW COMPARISON:  None. FINDINGS: There is bony fragmentation at the medial and lateral condyles, probably acute fractures. The arthroplasty hardware appears grossly intact. No dislocation. Proximal tibia and  proximal fibula appear intact. Femoral diaphysis appears intact. IMPRESSION: Fragmentation at the femoral condyles, suspicious for acute fractures. Electronically Signed   By: Andreas Newport M.D.   On: 08/06/2016 03:45   Dg Hip Unilat W Or Wo Pelvis 2-3 Views Right  Result Date: 08/06/2016 CLINICAL DATA:  [name] out of bed. EXAM: DG HIP  (WITH OR WITHOUT PELVIS) 2-3V RIGHT COMPARISON:  None. FINDINGS: Diffuse bone demineralization. Pelvis demonstrates no evidence of acute fracture or dislocation. SI joints and symphysis pubis are not displaced. Right hip demonstrates no evidence of acute fracture or dislocation. No focal bone lesion or bone destruction. Bone cortex appears intact. Mild degenerative changes. Vascular calcifications. IMPRESSION: No acute fracture or dislocation demonstrated in the pelvis or right hip Electronically Signed   By: Lucienne Capers M.D.   On: 08/06/2016 03:45    Lab Results: Basic Metabolic Panel:  Recent Labs  08/06/16 0502 08/07/16 0609  NA 138 139  K 3.5 3.5  CL 105 107  CO2 25 26  GLUCOSE 108* 97  BUN 16 15  CREATININE 0.58 0.64  CALCIUM 9.0 8.7*   Liver Function Tests:  Recent Labs  08/06/16 0502 08/07/16 0609  AST 20 19  ALT 17 13*  ALKPHOS 87 77  BILITOT 0.9 0.8  PROT 6.3* 5.7*  ALBUMIN 3.6 3.2*     CBC:  Recent Labs  08/06/16 0502 08/07/16 0609  WBC 8.1 7.0  NEUTROABS 5.4  --   HGB 13.0 12.3  HCT 39.9 38.1  MCV 87.7 88.8  PLT 242 219    No results found for this or any previous visit (from the past 240 hour(s)).   Hospital Course: This is a 82 year old who had been in her usual state of fair health at home and who fell. She landed on her knee. Initial x-ray of the knee was concerning that she had a fracture but CT did not reveal a fracture. She had physical therapy evaluation it was felt that she would be best served by going to rehabilitation and then home. Her family is hoping for more help at home when she goes home and I think that would be appropriate. She has other medical problems including chronic diastolic heart failure which was not an issue during her hospitalization, chronic atrial fib on no anticoagulation because of multiple falls, personal history of stroke. She has COPD as well.  Discharge Exam: Blood pressure 132/68, pulse 71, temperature 97.9  F (36.6 C), temperature source Oral, resp. rate 18, height 5\' 2"  (1.575 m), weight 70.9 kg (156 lb 3.2 oz), SpO2 93 %. She is awake and alert. She looks comfortable. Her chest is clear. Her heart is irregular.  Disposition: To skilled care facility. She will need PT OT and speech as needed. She will be on a heart healthy diet plan will be for her to return home with extensive help at home.      Signed: Ethan Clayburn Perkins   08/07/2016, 8:44 AM

## 2016-08-07 NOTE — Clinical Social Work Note (Signed)
Clinical Social Work Assessment  Patient Details  Name: Wendy Perkins MRN: [id number] Date of Birth: 1919/01/08  Date of referral:  08/07/16               Reason for consult:  Discharge Planning                Permission sought to share information with:    Permission granted to share information::     Name::        Agency::     Relationship::     Contact Information:     Housing/Transportation Living arrangements for the past 2 months:  Single Family Home Source of Information:  Patient Patient Interpreter Needed:  None Criminal Activity/Legal Involvement Pertinent to Current Situation/Hospitalization:  No - Comment as needed Significant Relationships:  Other Family Members, Adult Children Lives with:  Other (Comment) (granddaughter) Do you feel safe going back to the place where you live?  Yes Need for family participation in patient care:  Yes (Comment)  Care giving concerns: Pt is not at baseline.    Social Worker assessment / plan:  CSW met with pt at bedside. Pt alert and oriented and reports that her granddaughter is living with her right now. At baseline, pt ambulates with a walker. However, she has been using a wheelchair for the past few weeks after a fall. PT evaluated pt and recommend SNF. CSW discussed SNF process. Pt has been to [facility] in the past and requests to return there if possible. She is aware of plan to d/c today. Will discuss with family once bed offers received.   Employment status:  Retired Nurse, adult PT Recommendations:  [place] / Referral to community resources:  [place]  Patient/Family's Response to care:  Pt is agreeable to short term SNF and is aware of d/c today.    Patient/Family's Understanding of and Emotional Response to Diagnosis, Current Treatment, and Prognosis:  Pt is aware of admission diagnosis and recommendation for SNF.   Emotional Assessment Appearance:   Appears stated age Attitude/Demeanor/Rapport:  Other (Cooperative) Affect (typically observed):  Accepting Orientation:  Oriented to Self, Oriented to Place, Oriented to  Time, Oriented to Situation Alcohol / Substance use:  Not Applicable Psych involvement (Current and /or in the community):  No (Comment)  Discharge Needs  Concerns to be addressed:  Discharge Planning Concerns Readmission within the last 30 days:  No Current discharge risk:  Physical Impairment Barriers to Discharge:  No Barriers Identified   Wendy Perkins, [place] 08/07/2016, 9:27 AM [phone]

## 2016-08-07 NOTE — Progress Notes (Signed)
Subjective: She says she feels better. She has no new complaints. She still has pain in her leg. It has been recommended that she go to skilled care facility for rehabilitation and I think that's appropriate  Objective: Vital signs in last 24 hours: Temp:  [97.5 F (36.4 C)-98 F (36.7 C)] 97.9 F (36.6 C) (08/29 0616) Pulse Rate:  [62-71] 71 (08/29 0616) Resp:  [18] 18 (08/29 0616) BP: (132-170)/(62-80) 132/68 (08/29 0616) SpO2:  [93 %-95 %] 93 % (08/29 0616) Weight change:  Last BM Date: 08/06/16  Intake/Output from previous day: 08/28 0701 - 08/29 0700 In: 170.7 [I.V.:170.7] Out: 1600 [Urine:1600]  PHYSICAL EXAM General appearance: alert, cooperative and no distress Resp: clear to auscultation bilaterally Cardio: irregularly irregular rhythm GI: soft, non-tender; bowel sounds normal; no masses,  no organomegaly Extremities: She still has tenderness in her knee.  Lab Results:  Results for orders placed or performed during the hospital encounter of 08/06/16 (from the past 48 hour(s))  Urinalysis, Routine w reflex microscopic     Status: Abnormal   Collection Time: 08/06/16  4:50 AM  Result Value Ref Range   Color, Urine YELLOW YELLOW   APPearance CLEAR CLEAR   Specific Gravity, Urine <1.005 (L) 1.005 - 1.030   pH 6.0 5.0 - 8.0   Glucose, UA NEGATIVE NEGATIVE mg/dL   Hgb urine dipstick NEGATIVE NEGATIVE   Bilirubin Urine NEGATIVE NEGATIVE   Ketones, ur NEGATIVE NEGATIVE mg/dL   Protein, ur NEGATIVE NEGATIVE mg/dL   Nitrite NEGATIVE NEGATIVE   Leukocytes, UA NEGATIVE NEGATIVE    Comment: MICROSCOPIC NOT DONE ON URINES WITH NEGATIVE PROTEIN, BLOOD, LEUKOCYTES, NITRITE, OR GLUCOSE <1000 mg/dL.  Comprehensive metabolic panel     Status: Abnormal   Collection Time: 08/06/16  5:02 AM  Result Value Ref Range   Sodium 138 135 - 145 mmol/L   Potassium 3.5 3.5 - 5.1 mmol/L   Chloride 105 101 - 111 mmol/L   CO2 25 22 - 32 mmol/L   Glucose, Bld 108 (H) 65 - 99 mg/dL   BUN 16  6 - 20 mg/dL   Creatinine, Ser 0.58 0.44 - 1.00 mg/dL   Calcium 9.0 8.9 - 10.3 mg/dL   Total Protein 6.3 (L) 6.5 - 8.1 g/dL   Albumin 3.6 3.5 - 5.0 g/dL   AST 20 15 - 41 U/L   ALT 17 14 - 54 U/L   Alkaline Phosphatase 87 38 - 126 U/L   Total Bilirubin 0.9 0.3 - 1.2 mg/dL   GFR calc non Af Amer >60 >60 mL/min   GFR calc Af Amer >60 >60 mL/min    Comment: (NOTE) The eGFR has been calculated using the CKD EPI equation. This calculation has not been validated in all clinical situations. eGFR's persistently <60 mL/min signify possible Chronic Kidney Disease.    Anion gap 8 5 - 15  CBC with Differential     Status: Abnormal   Collection Time: 08/06/16  5:02 AM  Result Value Ref Range   WBC 8.1 4.0 - 10.5 K/uL   RBC 4.55 3.87 - 5.11 MIL/uL   Hemoglobin 13.0 12.0 - 15.0 g/dL   HCT 39.9 36.0 - 46.0 %   MCV 87.7 78.0 - 100.0 fL   MCH 28.6 26.0 - 34.0 pg   MCHC 32.6 30.0 - 36.0 g/dL   RDW 13.8 11.5 - 15.5 %   Platelets 242 150 - 400 K/uL   Neutrophils Relative % 66 %   Neutro Abs 5.4 1.7 - 7.7  K/uL   Lymphocytes Relative 16 %   Lymphs Abs 1.3 0.7 - 4.0 K/uL   Monocytes Relative 15 %   Monocytes Absolute 1.2 (H) 0.1 - 1.0 K/uL   Eosinophils Relative 2 %   Eosinophils Absolute 0.2 0.0 - 0.7 K/uL   Basophils Relative 1 %   Basophils Absolute 0.0 0.0 - 0.1 K/uL  CBC     Status: None   Collection Time: 08/07/16  6:09 AM  Result Value Ref Range   WBC 7.0 4.0 - 10.5 K/uL   RBC 4.29 3.87 - 5.11 MIL/uL   Hemoglobin 12.3 12.0 - 15.0 g/dL   HCT 38.1 36.0 - 46.0 %   MCV 88.8 78.0 - 100.0 fL   MCH 28.7 26.0 - 34.0 pg   MCHC 32.3 30.0 - 36.0 g/dL   RDW 13.9 11.5 - 15.5 %   Platelets 219 150 - 400 K/uL  Comprehensive metabolic panel     Status: Abnormal   Collection Time: 08/07/16  6:09 AM  Result Value Ref Range   Sodium 139 135 - 145 mmol/L   Potassium 3.5 3.5 - 5.1 mmol/L   Chloride 107 101 - 111 mmol/L   CO2 26 22 - 32 mmol/L   Glucose, Bld 97 65 - 99 mg/dL   BUN 15 6 - 20 mg/dL    Creatinine, Ser 0.64 0.44 - 1.00 mg/dL   Calcium 8.7 (L) 8.9 - 10.3 mg/dL   Total Protein 5.7 (L) 6.5 - 8.1 g/dL   Albumin 3.2 (L) 3.5 - 5.0 g/dL   AST 19 15 - 41 U/L   ALT 13 (L) 14 - 54 U/L   Alkaline Phosphatase 77 38 - 126 U/L   Total Bilirubin 0.8 0.3 - 1.2 mg/dL   GFR calc non Af Amer >60 >60 mL/min   GFR calc Af Amer >60 >60 mL/min    Comment: (NOTE) The eGFR has been calculated using the CKD EPI equation. This calculation has not been validated in all clinical situations. eGFR's persistently <60 mL/min signify possible Chronic Kidney Disease.    Anion gap 6 5 - 15    ABGS No results for input(s): PHART, PO2ART, TCO2, HCO3 in the last 72 hours.  Invalid input(s): PCO2 CULTURES No results found for this or any previous visit (from the past 240 hour(s)). Studies/Results: Ct Knee Right Wo Contrast  Result Date: 08/06/2016 CLINICAL DATA:  [name] this morning and injured right knee. Possible acute fractures on radiographs. EXAM: CT OF THE right KNEE WITHOUT CONTRAST TECHNIQUE: Multidetector CT imaging of the right knee was performed according to the standard protocol. Multiplanar CT image reconstructions were also generated. COMPARISON:  Radiographs 08/06/2016 FINDINGS: Limited examination due to significant artifact from the knee prosthesis. No obvious acute fractures are identified. The femoral, tibial and patellar prosthesis are intact. No complicating features. Areas of probable heterotopic ossification adjacent to the femoral condyles. Possible small joint effusion. IMPRESSION: No obvious acute fractures are identified. The prosthetic components are intact. Suspect small joint effusion. Electronically Signed   By: Marijo Sanes M.D.   On: 08/06/2016 12:16   Dg Knee Complete 4 Views Right  Result Date: 08/06/2016 CLINICAL DATA:  [name] out of bed tonight, landing on right knee. EXAM: RIGHT KNEE - COMPLETE 4+ VIEW COMPARISON:  None. FINDINGS: There is bony fragmentation at the  medial and lateral condyles, probably acute fractures. The arthroplasty hardware appears grossly intact. No dislocation. Proximal tibia and proximal fibula appear intact. Femoral diaphysis appears intact. IMPRESSION: Fragmentation at  the femoral condyles, suspicious for acute fractures. Electronically Signed   By: Andreas Newport M.D.   On: 08/06/2016 03:45   Dg Hip Unilat W Or Wo Pelvis 2-3 Views Right  Result Date: 08/06/2016 CLINICAL DATA:  [name] out of bed. EXAM: DG HIP (WITH OR WITHOUT PELVIS) 2-3V RIGHT COMPARISON:  None. FINDINGS: Diffuse bone demineralization. Pelvis demonstrates no evidence of acute fracture or dislocation. SI joints and symphysis pubis are not displaced. Right hip demonstrates no evidence of acute fracture or dislocation. No focal bone lesion or bone destruction. Bone cortex appears intact. Mild degenerative changes. Vascular calcifications. IMPRESSION: No acute fracture or dislocation demonstrated in the pelvis or right hip Electronically Signed   By: Lucienne Capers M.D.   On: 08/06/2016 03:45    Medications:  Prior to Admission:  Prescriptions Prior to Admission  Medication Sig Dispense Refill Last Dose  . acetaminophen (TYLENOL) 650 MG CR tablet Take 1,300 mg by mouth 3 (three) times daily.    08/05/2016 at Unknown time  . ANORO ELLIPTA 62.5-25 MCG/INH AEPB Inhale 1 Inhaler into the lungs daily.    08/05/2016 at Unknown time  . aspirin 81 MG tablet Take 81 mg by mouth daily.   08/05/2016 at Unknown time  . atorvastatin (LIPITOR) 80 MG tablet Take 1 tablet (80 mg total) by mouth daily at 6 PM.   08/05/2016 at Unknown time  . cycloSPORINE (RESTASIS) 0.05 % ophthalmic emulsion Place 1 drop into both eyes every 12 (twelve) hours.    08/05/2016 at Unknown time  . diclofenac sodium (VOLTAREN) 1 % GEL Apply 2 g topically 4 (four) times daily as needed for pain.   08/05/2016 at Unknown time  . diltiazem (CARDIZEM CD) 180 MG 24 hr capsule TAKE ONE CAPSULE BY MOUTH ONCE DAILY. 30  capsule 6 08/05/2016 at Unknown time  . fluticasone (FLONASE) 50 MCG/ACT nasal spray Place 2 sprays into the nose daily as needed for allergies. Reported on 06/27/2016   unknown  . furosemide (LASIX) 20 MG tablet TAKE 1 TABLET BY MOUTH DAILY. 30 tablet 6 08/05/2016 at Unknown time  . Multiple Vitamin (MULITIVITAMIN WITH MINERALS) TABS Take 1 tablet by mouth every other day. Takes 1 tab 2-3 times a week   Past Week at Unknown time  . omeprazole (PRILOSEC) 20 MG capsule Take 20 mg by mouth daily.     08/05/2016 at Unknown time  . Polyethyl Glycol-Propyl Glycol (SYSTANE OP) Apply 1 drop to eye daily. For dry eyes   08/05/2016 at Unknown time  . potassium chloride SA (K-DUR,KLOR-CON) 20 MEQ tablet TAKE ONE TABLET BY MOUTH EVERY OTHER DAY. TAKE ONE THE DAY YOU TAKE FUROSEMIDE. 30 tablet 3 08/05/2016 at Unknown time  . PROAIR HFA 108 (90 BASE) MCG/ACT inhaler 2 puffs 3 (three) times daily as needed for wheezing (cough).    unknown  . PROCTO-MED HC 2.5 % rectal cream Place 1 application rectally 4 (four) times daily.   08/05/2016 at Unknown time   Scheduled: . acetaminophen  650 mg Oral BID  . aspirin EC  81 mg Oral Daily  . atorvastatin  80 mg Oral q1800  . cycloSPORINE  1 drop Both Eyes BID  . diltiazem  180 mg Oral Daily  . enoxaparin (LOVENOX) injection  40 mg Subcutaneous Daily  . furosemide  20 mg Oral Daily  . pantoprazole  40 mg Oral Daily  . potassium chloride SA  20 mEq Oral QODAY  . umeclidinium-vilanterol  1 puff Inhalation Daily   Continuous: .  sodium chloride 10 mL/hr at 08/06/16 7680   SUP:[id number] **OR** acetaminophen, albuterol, polyvinyl alcohol  Assesment:She was admitted after a fall with knee pain. Although there was concern that she had a fracture she did not. Physical therapy evaluation was undertaken and it was recommended that she go to a skilled care facility for rehabilitation. Plans are for her to return home perhaps with more help at home after that.  She has  hypertension and that's pre-well controlled.  She has orthostatic hypotension and that doesn't seem to be quite as much of an issue now  She has chronic diastolic heart failure is asymptomatic at this point  She has chronic atrial fib and she is off anticoagulation because of multiple falls. Active Problems:   Essential hypertension, benign   Knee pain, acute   Fall    Plan: Agree with plans for skilled care facility placement and then home.    LOS: 0 days   Brit Wernette L 08/07/2016, 8:38 AM

## 2016-08-07 NOTE — Progress Notes (Signed)
Report given to [facility]. IV cath removed and intact. No c/o pain at this time. Family notified of pt being transferred to facility.

## 2016-08-07 NOTE — Clinical Social Work Placement (Signed)
   CLINICAL SOCIAL WORK PLACEMENT  NOTE  Date:  08/07/2016  Patient Details  Name: Wendy Perkins MRN: [id number] Date of Birth: 1919/08/31  Clinical Social Work is seeking post-discharge placement for this patient at the [place] level of care (*CSW will initial, date and re-position this form in  chart as items are completed):  Yes   Patient/family provided with [facility] Work Department's list of facilities offering this level of care within the geographic area requested by the patient (or if unable, by the patient's family).  Yes   Patient/family informed of their freedom to choose among providers that offer the needed level of care, that participate in Medicare, Medicaid or managed care program needed by the patient, have an available bed and are willing to accept the patient.  Yes   Patient/family informed of [facility]'s ownership interest in [facility] and [facility] as well as of the fact that they are under no obligation to receive care at these facilities.  PASRR submitted to EDS on       PASRR number received on       Existing PASRR number confirmed on 08/07/16     FL2 transmitted to all facilities in geographic area requested by pt/family on 08/07/16     FL2 transmitted to all facilities within larger geographic area on       Patient informed that his/her managed care company has contracts with or will negotiate with certain facilities, including the following:        Yes   Patient/family informed of bed offers received.  Patient chooses bed at [facility]     Physician recommends and patient chooses bed at      Patient to be transferred to [facility] on 08/07/16.  Patient to be transferred to facility by staff     Patient family notified on 08/07/16 of transfer.  Name of family member notified:  Jenny Reichmann- son     PHYSICIAN       Additional Comment:     _______________________________________________ [name], [place] 08/07/2016, 10:43 AM [phone]

## 2016-08-07 NOTE — Consult Note (Signed)
   [facility] CM Inpatient Consult   08/07/2016  Wendy Perkins 10-30-1919 [id number]  Patient screened for potential [facility] Management services. Patient is eligible for [place]. Electronic medical record reveals patient's discharge plan is for [facility] there were no identifiable [facility] care management needs at this time. [facility] Care Management services not appropriate at this time. If patient's post hospital needs change please place a [facility] Care Management consult.  For questions please contact:    Royetta Crochet. Laymond Purser, RN, BSN, [place] [phone]) Business Cell  [phone]) Toll Free Office

## 2016-08-26 DIAGNOSIS — I482 Chronic atrial fibrillation: Secondary | ICD-10-CM | POA: Diagnosis not present

## 2016-08-26 DIAGNOSIS — M25569 Pain in unspecified knee: Secondary | ICD-10-CM | POA: Diagnosis not present

## 2016-08-27 ENCOUNTER — Ambulatory Visit (INDEPENDENT_AMBULATORY_CARE_PROVIDER_SITE_OTHER): Payer: Medicare Other | Admitting: Orthopedic Surgery

## 2016-08-27 ENCOUNTER — Encounter: Payer: Self-pay | Admitting: Orthopedic Surgery

## 2016-08-27 VITALS — BP 126/53 | HR 72 | Ht 63.0 in

## 2016-08-27 DIAGNOSIS — T84498A Other mechanical complication of other internal orthopedic devices, implants and grafts, initial encounter: Secondary | ICD-10-CM | POA: Diagnosis not present

## 2016-08-27 NOTE — H&P (Signed)
Wendy Perkins MRN: [id number] DOB/AGE: [id number] 82 y.o. Primary Care Physician:Wolfe Camarena L, MD Admit date: 08/07/2016 Chief Complaint: Knee pain weakness HPI: This is documentation of my history and physical done at the nursing home on 08/26/2016. She is a 82 year old who had fallen at home and had severe weakness of her right leg particularly at the knee. She was seen in the hospital and transferred to skilled care facility for rehabilitation. Her past medical history is positive for multiple medical problems including chronic atrial fibrillation off anticoagulation because of multiple falls carotid artery disease orthostatic hypotension essential hypertension chronic hypoxic respiratory failure, chronic dizziness. She says she feels better but her knee is still very weak. She thinks she might be able to get up and use a walker and she is going to discuss that with the physical therapist. She wants to go home her family is very concerned that she's not be able to manage at home and I think that is a realistic worry. She's had multiple falls over the last several years.  Past Medical History:  Diagnosis Date  . Atrial fibrillation ([place])   . Breast nodule 11/15/2014  . Breast pain, left 11/15/2014  . Carotid artery disease ([place])   . Dizziness   . Dyspnea    Previous CPX suggesting possible restrictive physiology, respiratory muscle fatigue, diastolic dysfunction  . Essential hypertension, benign   . Hyperlipidemia   . Oxygen dependent    2 liter  . Rib pain on left side 11/15/2014  . Seasonal allergies   . Shingles 05/04/2015  . Toe fracture, left    left big toe   Past Surgical History:  Procedure Laterality Date  . COLONOSCOPY  02/22/2012   Procedure: COLONOSCOPY;  Surgeon: Rogene Houston, MD;  Location: [facility] ENDO SUITE;  Service: Endoscopy;  Laterality: N/A;  100  . KNEE SURGERY     bilateral        Family History  Problem Relation Age of Onset  . Stroke Mother   .  Pneumonia Father   . Heart disease Father   . Healthy Son   . Glaucoma Daughter   . Emphysema Daughter   . Healthy Daughter   . Other Daughter     had knee replacement  . Healthy Son   . Heart disease Maternal Grandmother     Social History:  reports that she has never smoked. She has never used smokeless tobacco. She reports that she does not drink alcohol or use drugs.   Allergies:  Allergies  Allergen Reactions  . Sulfur Swelling    Rash also per patient    Medications Prior to Admission  Medication Sig Dispense Refill  . acetaminophen (TYLENOL) 650 MG CR tablet Take 1,300 mg by mouth 3 (three) times daily.     Jearl Klinefelter ELLIPTA 62.5-25 MCG/INH AEPB Inhale 1 Inhaler into the lungs daily.     Marland Kitchen aspirin 81 MG tablet Take 81 mg by mouth daily.    Marland Kitchen atorvastatin (LIPITOR) 80 MG tablet Take 1 tablet (80 mg total) by mouth daily at 6 PM.    . cycloSPORINE (RESTASIS) 0.05 % ophthalmic emulsion Place 1 drop into both eyes every 12 (twelve) hours.     . diclofenac sodium (VOLTAREN) 1 % GEL Apply 2 g topically 4 (four) times daily as needed for pain.    Marland Kitchen diltiazem (CARDIZEM CD) 180 MG 24 hr capsule TAKE ONE CAPSULE BY MOUTH ONCE DAILY. 30 capsule 6  . fluticasone (FLONASE) 50 MCG/ACT nasal  spray Place 2 sprays into the nose daily as needed for allergies. Reported on 06/27/2016    . furosemide (LASIX) 20 MG tablet TAKE 1 TABLET BY MOUTH DAILY. 30 tablet 6  . Multiple Vitamin (MULITIVITAMIN WITH MINERALS) TABS Take 1 tablet by mouth every other day. Takes 1 tab 2-3 times a week    . omeprazole (PRILOSEC) 20 MG capsule Take 20 mg by mouth daily.      Vladimir Faster Glycol-Propyl Glycol (SYSTANE OP) Apply 1 drop to eye daily. For dry eyes    . potassium chloride SA (K-DUR,KLOR-CON) 20 MEQ tablet TAKE ONE TABLET BY MOUTH EVERY OTHER DAY. TAKE ONE THE DAY YOU TAKE FUROSEMIDE. 30 tablet 3  . PROAIR HFA 108 (90 BASE) MCG/ACT inhaler 2 puffs 3 (three) times daily as needed for wheezing (cough).      Marland Kitchen PROCTO-MED HC 2.5 % rectal cream Place 1 application rectally 4 (four) times daily.         [id number] from the symptoms mentioned above,there are no other symptoms referable to all systems reviewed.  Physical Exam: There were no vitals taken for this visit. She is awake and alert. Her pupils are reactive nose and throat are clear she is mildly hard of hearing but wearing hearing aids. Her neck is supple without masses. Chest is clear. Heart irregularly irregular without gallop. Her abdomen is soft without masses. Extremities show no edema. She does appear to be weak in her right lower extremity. Central nervous system examination is grossly intact   No results for input(s): WBC, NEUTROABS, HGB, HCT, MCV, PLT in the last 72 hours. No results for input(s): NA, K, CL, CO2, GLUCOSE, BUN, CREATININE, CALCIUM, MG in the last 72 hours.  Invalid input(s): PHOlablast2(ast:2,ALT:2,alkphos:2,bilitot:2,prot:2,albumin:2)@    No results found for this or any previous visit (from the past 240 hour(s)).   Ct Knee Right Wo Contrast  Result Date: 08/06/2016 CLINICAL DATA:  [name] this morning and injured right knee. Possible acute fractures on radiographs. EXAM: CT OF THE right KNEE WITHOUT CONTRAST TECHNIQUE: Multidetector CT imaging of the right knee was performed according to the standard protocol. Multiplanar CT image reconstructions were also generated. COMPARISON:  Radiographs 08/06/2016 FINDINGS: Limited examination due to significant artifact from the knee prosthesis. No obvious acute fractures are identified. The femoral, tibial and patellar prosthesis are intact. No complicating features. Areas of probable heterotopic ossification adjacent to the femoral condyles. Possible small joint effusion. IMPRESSION: No obvious acute fractures are identified. The prosthetic components are intact. Suspect small joint effusion. Electronically Signed   By: Marijo Sanes M.D.   On: 08/06/2016 12:16   Dg Knee  Complete 4 Views Right  Result Date: 08/06/2016 CLINICAL DATA:  [name] out of bed tonight, landing on right knee. EXAM: RIGHT KNEE - COMPLETE 4+ VIEW COMPARISON:  None. FINDINGS: There is bony fragmentation at the medial and lateral condyles, probably acute fractures. The arthroplasty hardware appears grossly intact. No dislocation. Proximal tibia and proximal fibula appear intact. Femoral diaphysis appears intact. IMPRESSION: Fragmentation at the femoral condyles, suspicious for acute fractures. Electronically Signed   By: Andreas Newport M.D.   On: 08/06/2016 03:45   Dg Hip Unilat W Or Wo Pelvis 2-3 Views Right  Result Date: 08/06/2016 CLINICAL DATA:  [name] out of bed. EXAM: DG HIP (WITH OR WITHOUT PELVIS) 2-3V RIGHT COMPARISON:  None. FINDINGS: Diffuse bone demineralization. Pelvis demonstrates no evidence of acute fracture or dislocation. SI joints and symphysis pubis are not displaced. Right hip demonstrates no  evidence of acute fracture or dislocation. No focal bone lesion or bone destruction. Bone cortex appears intact. Mild degenerative changes. Vascular calcifications. IMPRESSION: No acute fracture or dislocation demonstrated in the pelvis or right hip Electronically Signed   By: Lucienne Capers M.D.   On: 08/06/2016 03:45   Impression: She has pretty severe weakness. She's had multiple falls. She has a history of orthostatic hypotension. She has chronic atrial fib off anticoagulation now. She is high risk for more falls. Active Problems:   * No active hospital problems. *     Plan: Continue with current treatments trying to get her stronger and then come up with a disposition after we see how she does      Riyah Bardon L   08/27/2016, 7:55 AM

## 2016-08-27 NOTE — Progress Notes (Addendum)
Chief Complaint  Patient presents with  . Knee Pain    RIGHT KNEE PAIN   HPI  82 year old female has bilateral total knee replacements  She comes in for evaluation of right knee pain and swelling status post right total knee with previous evaluation by Dr. Durward Fortes who put the knee in indicating that she needed revision. However she has COPD extremely cannot be put to sleep  She fell out of bed in August of this year and has pain and swelling since  CT scan and x-ray shows that the implant is not loose although on x-ray looks loose and looks like it needs a revision especially femoral component.  Review of Systems  Constitutional: Positive for weight loss.  HENT: Positive for hearing loss.   Eyes: Negative.   Respiratory: Positive for shortness of breath.   Cardiovascular: Positive for palpitations and leg swelling.  Gastrointestinal: Positive for constipation.  Genitourinary: Positive for frequency.  Musculoskeletal: Positive for joint pain and myalgias.  Neurological: Positive for tingling.    Past Medical History:  Diagnosis Date  . Atrial fibrillation ([place])   . Breast nodule 11/15/2014  . Breast pain, left 11/15/2014  . Carotid artery disease ([place])   . Dizziness   . Dyspnea    Previous CPX suggesting possible restrictive physiology, respiratory muscle fatigue, diastolic dysfunction  . Essential hypertension, benign   . Hyperlipidemia   . Oxygen dependent    2 liter  . Rib pain on left side 11/15/2014  . Seasonal allergies   . Shingles 05/04/2015  . Toe fracture, left    left big toe    Past Surgical History:  Procedure Laterality Date  . COLONOSCOPY  02/22/2012   Procedure: COLONOSCOPY;  Surgeon: Rogene Houston, MD;  [facility]: [facility];  Service: Endoscopy;  Laterality: N/A;  100  . KNEE SURGERY     bilateral   Family History  Problem Relation Age of Onset  . Stroke Mother   . Pneumonia Father   . Heart disease Father   . Healthy Son   . Glaucoma  Daughter   . Emphysema Daughter   . Healthy Daughter   . Other Daughter     had knee replacement  . Healthy Son   . Heart disease Maternal Grandmother    Social History  Substance Use Topics  . Smoking status: Never Smoker  . Smokeless tobacco: Never Used  . Alcohol use No   Current Meds  Medication Sig  . acetaminophen (TYLENOL) 650 MG CR tablet Take 1,300 mg by mouth 3 (three) times daily.   Jearl Klinefelter ELLIPTA 62.5-25 MCG/INH AEPB Inhale 1 Inhaler into the lungs daily.   Marland Kitchen aspirin 81 MG tablet Take 81 mg by mouth daily.  Marland Kitchen atorvastatin (LIPITOR) 80 MG tablet Take 1 tablet (80 mg total) by mouth daily at 6 PM.  . cycloSPORINE (RESTASIS) 0.05 % ophthalmic emulsion Place 1 drop into both eyes every 12 (twelve) hours.   . diclofenac sodium (VOLTAREN) 1 % GEL Apply 2 g topically 4 (four) times daily as needed for pain.  Marland Kitchen diltiazem (CARDIZEM CD) 180 MG 24 hr capsule TAKE ONE CAPSULE BY MOUTH ONCE DAILY.  . fluticasone (FLONASE) 50 MCG/ACT nasal spray Place 2 sprays into the nose daily as needed for allergies. Reported on 06/27/2016  . furosemide (LASIX) 20 MG tablet TAKE 1 TABLET BY MOUTH DAILY.  . Multiple Vitamin (MULITIVITAMIN WITH MINERALS) TABS Take 1 tablet by mouth every other day. Takes 1 tab 2-3 times  a week  . omeprazole (PRILOSEC) 20 MG capsule Take 20 mg by mouth daily.    Vladimir Faster Glycol-Propyl Glycol (SYSTANE OP) Apply 1 drop to eye daily. For dry eyes  . potassium chloride SA (K-DUR,KLOR-CON) 20 MEQ tablet TAKE ONE TABLET BY MOUTH EVERY OTHER DAY. TAKE ONE THE DAY YOU TAKE FUROSEMIDE.  Marland Kitchen PROAIR HFA 108 (90 BASE) MCG/ACT inhaler 2 puffs 3 (three) times daily as needed for wheezing (cough).   Marland Kitchen PROCTO-MED HC 2.5 % rectal cream Place 1 application rectally 4 (four) times daily.    BP (!) 126/53   Pulse 72   Ht 5\' 3"  (1.6 m)   Physical Exam  Constitutional: She is oriented to person, place, and time. She appears well-developed and well-nourished. No distress.   Cardiovascular: Normal rate and intact distal pulses.   Neurological: She is alert and oriented to person, place, and time. She has normal reflexes. She exhibits normal muscle tone. Coordination normal.  Skin: Skin is warm and dry. No rash noted. She is not diaphoretic. No erythema. No pallor.  Psychiatric: She has a normal mood and affect. Her behavior is normal. Judgment and thought content normal.    Ortho Exam the right knee is notable for posterior subluxation of the tibia with an anterior drawer test which is positive she cannot do in extension to full extension with extensor lag and 45 flexion is near 120 collateral ligaments are stable in flexion  She is tender the knee is look swollen but it's mostly from the subluxation  Skin incision is clean no warmth or erythema  Left knee total knee replacement knee flexion 120 knee stable no tenderness no swelling ASSESSMENT: My personal interpretation of the images:  Plain films and CT scan are reviewed. The implant appears to have extensive ostial lysis of the femoral component posterior subluxation of the tibial component I question whether or not there is been a tendon injury of the extensor mechanism    PLAN The patient will require revision or fusion which is not something our practice with handle. She is already been seen by Dr. Durward Fortes who placed her in some type of knee sleeve also indicating that she needed a revision  Follow-up as needed  Arther Abbott, MD 08/27/2016 2:44 PM  .meds

## 2016-09-03 DIAGNOSIS — M545 Low back pain: Secondary | ICD-10-CM | POA: Diagnosis not present

## 2016-09-04 ENCOUNTER — Encounter (HOSPITAL_COMMUNITY)
Admission: RE | Admit: 2016-09-04 | Discharge: 2016-09-04 | Disposition: A | Discharge status: Home / Self Care | Payer: Medicare Other | Source: Skilled Nursing Facility | Attending: Pulmonary Disease | Admitting: Pulmonary Disease

## 2016-09-04 DIAGNOSIS — Z9181 History of falling: Secondary | ICD-10-CM | POA: Diagnosis not present

## 2016-09-04 LAB — URINE MICROSCOPIC-ADD ON

## 2016-09-04 LAB — URINALYSIS, ROUTINE W REFLEX MICROSCOPIC
BILIRUBIN URINE: NEGATIVE
Glucose, UA: NEGATIVE mg/dL
HGB URINE DIPSTICK: NEGATIVE
KETONES UR: NEGATIVE mg/dL
Nitrite: NEGATIVE
Protein, ur: NEGATIVE mg/dL
SPECIFIC GRAVITY, URINE: 1.01 (ref 1.005–1.030)
pH: 6 (ref 5.0–8.0)

## 2016-09-05 LAB — URINE CULTURE: CULTURE: NO GROWTH

## 2016-09-17 NOTE — Progress Notes (Signed)
She says she feels okay. She says she thinks there is something that she needed to have changed but she can't remember what it is right now. She still had a lot of trouble with her knee and has trouble walking because of that. She has not noticed any rapid atrial fib. She is still short of breath with exertion. Her dizziness is better.  Exam shows that she is awake and alert. She's seated comfortably in a chair. Her pupils are reactive nose and throat are clear she does appear to be in atrial fib. Her chest is clear. Heart is irregular as noted. Her abdomen is soft. She doesn't have any edema.  She has had multiple falls. She still has trouble with her knee. She has hypertension which is controlled. She has chronic atrial fib but that is stable. She has shortness of breath which is multi-factorial and that's unchanged  Continue efforts at trying to be able to get her to be able to ambulate. I don't think it's going to be safe for her to be home alone.

## 2016-10-04 ENCOUNTER — Other Ambulatory Visit (INDEPENDENT_AMBULATORY_CARE_PROVIDER_SITE_OTHER): Payer: Self-pay | Admitting: *Deleted

## 2016-10-04 ENCOUNTER — Encounter (INDEPENDENT_AMBULATORY_CARE_PROVIDER_SITE_OTHER): Payer: Self-pay | Admitting: *Deleted

## 2016-10-04 DIAGNOSIS — R195 Other fecal abnormalities: Secondary | ICD-10-CM

## 2016-10-28 NOTE — Progress Notes (Signed)
This is documentation of my visit at the skilled care facility of 10/21/2016. She is in the skilled care facility for rehabilitation with severe weakness chronic atrial fibrillation orthostatic hypotension hypertension chronic hypoxic respiratory failure, chronic dizziness and severe arthritis in multiple joints.  Exam: Constitutional: She is awake and alert complaining of hip pain but no other acute distress. Ears nose mouth and throat: Her mucous membranes are moist. Cardiovascular: She is in atrial fibrillation with well-controlled ventricular response. She has a soft diastolic murmur. Respiratory: Her respiratory effort is normal. Her lungs are clear. Neuromuscular: She complains of pain in her hip and knee exam of those show significant arthritic changes. She is using a walker  She is improving slowly. I don't think she is going to be able to return to her independent living.

## 2016-11-19 ENCOUNTER — Non-Acute Institutional Stay (SKILLED_NURSING_FACILITY): Payer: Medicare Other | Admitting: Internal Medicine

## 2016-11-19 ENCOUNTER — Encounter: Payer: Self-pay | Admitting: Internal Medicine

## 2016-11-19 DIAGNOSIS — I5032 Chronic diastolic (congestive) heart failure: Secondary | ICD-10-CM

## 2016-11-19 DIAGNOSIS — I10 Essential (primary) hypertension: Secondary | ICD-10-CM

## 2016-11-19 DIAGNOSIS — J449 Chronic obstructive pulmonary disease, unspecified: Secondary | ICD-10-CM

## 2016-11-19 DIAGNOSIS — I482 Chronic atrial fibrillation, unspecified: Secondary | ICD-10-CM

## 2016-11-19 DIAGNOSIS — M25561 Pain in right knee: Secondary | ICD-10-CM | POA: Diagnosis not present

## 2016-11-19 DIAGNOSIS — G8929 Other chronic pain: Secondary | ICD-10-CM

## 2016-11-19 NOTE — Progress Notes (Signed)
Location:   [facility] Room Number: 156/D Place of Service:  SNF [phone]) Provider:  Clydene Fake, MD  Patient Care Team: Virgie Dad, MD as PCP - General (Internal Medicine)  Extended Emergency Contact Information Primary Emergency Contact: [place], [place], [place] [address] [name] of [place] Phone: [phone] Relation: Son Secondary Emergency Contact: [name], [place] [address] [name] of [place] Phone: [phone] Mobile Phone: [phone] Relation: Son  Code Status:  DNR Goals of care: Advanced Directive information Advanced Directives 11/19/2016  Does Patient Have a Medical Advance Directive? Yes  Type of Advance Directive Out of facility DNR (pink MOST or yellow form)  Does patient want to make changes to medical advance directive? No - Patient declined  Would patient like information on creating a medical advance directive? -  Pre-existing out of facility DNR order (yellow form or pink MOST form) -     Chief Complaint  Patient presents with  . Medical Management of Chronic Issues    Routine Visit   HPI:  Pt is a 82 y.o. female seen today for medical management of chronic diseases.   Patient was admitted to [facility] facility after she fell at home and was unable to stay by herself at home.Patient was followed by Dr Luan Pulling and patient want to transfer care so we can follow her while she is the Facility. Patient has h/oRecurrent falls, Atrial fibrillation not on any coagulation, Possible COPD, Hyperlipidemia, And Hypertension Patient was living by herself at home with her family providing the support as needed. She fell when trying to get out of her bed. Says my Knee gave away. Her X ray and CT of the right  knee were negative for any acuta injury.She has chronic Right knee pain and swelling and has been evaluated by Dr Aline Brochure And Dr Durward Fortes who both think she needs revision of her  knee prosthesis but because of her age and frailty she is poor candidate for that.  Patients only complain is pain in her knee. She says tylenol and Tramadol helps but wants to know if anything else can be done for it. She does understand that she is going to be Long term resident now and cannot go home independent.   Past Medical History:  Diagnosis Date  . Atrial fibrillation ([place])   . Breast nodule 11/15/2014  . Breast pain, left 11/15/2014  . Carotid artery disease ([place])   . Dizziness   . Dyspnea    Previous CPX suggesting possible restrictive physiology, respiratory muscle fatigue, diastolic dysfunction  . Essential hypertension, benign   . Hyperlipidemia   . Oxygen dependent    2 liter  . Rib pain on left side 11/15/2014  . Seasonal allergies   . Shingles 05/04/2015  . Toe fracture, left    left big toe   Past Surgical History:  Procedure Laterality Date  . COLONOSCOPY  02/22/2012   Procedure: COLONOSCOPY;  Surgeon: Rogene Houston, MD;  [facility];  Service: Endoscopy;  Laterality: N/A;  100  . KNEE SURGERY     bilateral    Allergies  Allergen Reactions  . Sulfur Swelling    Rash also per patient    Current Outpatient Prescriptions on File Prior to Visit  Medication Sig Dispense Refill  . acetaminophen (TYLENOL) 650 MG CR tablet Take 1,300 mg by mouth 3 (three)  times daily.     Jearl Klinefelter ELLIPTA 62.5-25 MCG/INH AEPB Inhale 1 Inhaler into the lungs daily.     Marland Kitchen aspirin 81 MG tablet Take 81 mg by mouth daily.    Marland Kitchen atorvastatin (LIPITOR) 80 MG tablet Take 1 tablet (80 mg total) by mouth daily at 6 PM.    . cycloSPORINE (RESTASIS) 0.05 % ophthalmic emulsion Place 1 drop into both eyes every 12 (twelve) hours.     . diclofenac sodium (VOLTAREN) 1 % GEL Apply 2 g topically 4 (four) times daily as needed for pain.    Marland Kitchen diltiazem (CARDIZEM CD) 180 MG 24 hr capsule TAKE ONE CAPSULE BY MOUTH ONCE DAILY. 30 capsule 6  . fluticasone (FLONASE) 50 MCG/ACT nasal spray  Place 2 sprays into the nose daily as needed for allergies. Reported on 06/27/2016    . furosemide (LASIX) 20 MG tablet TAKE 1 TABLET BY MOUTH DAILY. 30 tablet 6  . Multiple Vitamin (MULITIVITAMIN WITH MINERALS) TABS Take 1 tablet by mouth every other day. Takes 1 tab 2-3 times a week    . omeprazole (PRILOSEC) 20 MG capsule Take 20 mg by mouth daily.      Vladimir Faster Glycol-Propyl Glycol (SYSTANE OP) Apply 1 drop to eye 3 (three) times daily. For dry eyes    . potassium chloride SA (K-DUR,KLOR-CON) 20 MEQ tablet TAKE ONE TABLET BY MOUTH EVERY OTHER DAY. TAKE ONE THE DAY YOU TAKE FUROSEMIDE. 30 tablet 3  . PROAIR HFA 108 (90 BASE) MCG/ACT inhaler 2 puffs 3 (three) times daily as needed for wheezing (cough).     Marland Kitchen PROCTO-MED HC 2.5 % rectal cream Place 1 application rectally daily as needed.     . [DISCONTINUED] Albuterol (VENTOLIN IN) Inhale into the lungs.       No current facility-administered medications on file prior to visit.      Review of Systems  Constitutional: Positive for activity change and appetite change. Negative for chills, diaphoresis and fatigue.  HENT: Negative for postnasal drip, rhinorrhea, sinus pain, sinus pressure and sore throat.   Respiratory: Positive for shortness of breath. Negative for apnea, cough, wheezing and stridor.   Cardiovascular: Positive for leg swelling. Negative for chest pain and palpitations.  Gastrointestinal: Positive for constipation. Negative for abdominal distention, diarrhea and nausea.  Genitourinary: Negative for difficulty urinating, dysuria, flank pain, frequency and urgency.  Musculoskeletal: Positive for joint swelling. Negative for arthralgias, back pain, gait problem and myalgias.  Skin: Negative for color change and rash.  Neurological: Negative for dizziness, weakness and light-headedness.  Psychiatric/Behavioral: Negative for agitation, confusion and dysphoric mood. The patient is not nervous/anxious.     Immunization History    Administered Date(s) Administered  . Influenza-Unspecified 10/10/2013   Pertinent  Health Maintenance Due  Topic Date Due  . DEXA SCAN  01/15/1986  . PNA vac Low Risk Adult (1 of 2 - PCV13) 01/15/1986  . INFLUENZA VACCINE  07/10/2016   No flowsheet data found. Functional Status Survey:    Vitals:   11/19/16 1641  BP: (!) 142/64  Pulse: 64  Resp: 20  Temp: 98 F (36.7 C)   There is no height or weight on file to calculate BMI. Physical Exam  Constitutional: She is oriented to person, place, and time. She appears well-developed and well-nourished.  HENT:  Head: Normocephalic.  Mouth/Throat: Oropharynx is clear and moist.  Eyes: Conjunctivae are normal. Pupils are equal, round, and reactive to light.  Cardiovascular: Normal rate and normal heart sounds.  No murmur heard. Pulmonary/Chest: Effort normal and breath sounds normal. No respiratory distress. She has no wheezes. She has no rales.  Abdominal: Soft. Bowel sounds are normal. She exhibits no distension. There is no tenderness. There is no rebound.  Musculoskeletal:  Patient had mild edema in Both Lower extremity. Had good motion in left knee with mild pain but Right knee was swollen and unable to do any Extension and flexion more then 45 degrees Without Pain.Marland Kitchen   Neurological: She is alert and oriented to person, place, and time.  Patient had good strength in Upper Extremity.  Psychiatric: She has a normal mood and affect. Her behavior is normal. Judgment and thought content normal.  Patient was little depressed as she is unable to go home but had good attitude about it.    Labs reviewed:  Recent Labs  08/06/16 0502 08/07/16 0609  NA 138 139  K 3.5 3.5  CL 105 107  CO2 25 26  GLUCOSE 108* 97  BUN 16 15  CREATININE 0.58 0.64  CALCIUM 9.0 8.7*    Recent Labs  08/06/16 0502 08/07/16 0609  AST 20 19  ALT 17 13*  ALKPHOS 87 77  BILITOT 0.9 0.8  PROT 6.3* 5.7*  ALBUMIN 3.6 3.2*    Recent Labs   06/27/16 1518 08/06/16 0502 08/07/16 0609  WBC 7.1 8.1 7.0  NEUTROABS 4,402 5.4  --   HGB 12.6 13.0 12.3  HCT 38.2 39.9 38.1  MCV 86.2 87.7 88.8  PLT 256 242 219   No results found for: TSH No results found for: HGBA1C Lab Results  Component Value Date   CHOL 134 10/17/2015   HDL 53 10/17/2015   LDLCALC 74 10/17/2015   TRIG 37 10/17/2015   CHOLHDL 2.5 10/17/2015    Significant Diagnostic Results in last 30 days:  No results found.  Assessment/Plan  Chronic pain of right knee  Patient does need revision of her knee prosthesis but not candidate for surgery. D/W the Son in the room and the patient. Right now only thing we can do is control pain. It seems her pain is controlled on Ultram and Tylenol. I have told them that we can start her on Low dose of Hydrocodone for pain control. But patient says it will make her confused and she is wants to keep trying Ultram for now.  Chronic atrial fibrillation Rate controlled. Patient is on Diltiazem. Continue aspirin. Never been on Anticoagulation due to falls.  Chronic diastolic heart failure   2 D echo in 10/2015 showed EF of 65 %. Patient is stable on low dose of lasix and potassium. Will repeat BMP.  Essential hypertension   BP slightly elevated today. Will continue to monitor. Patient it seems has h/o Orthostatic Hypotension too.   Chronic obstructive pulmonary disease Patient says she was told she needs oxygen while sleeping. She gets SOB on exertion. Is on Chronic inhalers.    Family/ staff Communication: D/W the patient and the son and spend more then 45 min discussing the long term goals for patient and coordinating with the nurses  .   Labs/tests ordered:  BMP, CBC with Diff.

## 2016-11-20 ENCOUNTER — Encounter (HOSPITAL_COMMUNITY)
Admission: RE | Admit: 2016-11-20 | Discharge: 2016-11-20 | Disposition: A | Discharge status: Home / Self Care | Payer: Medicare Other | Source: Skilled Nursing Facility | Attending: Pulmonary Disease | Admitting: Pulmonary Disease

## 2016-11-20 DIAGNOSIS — Z9981 Dependence on supplemental oxygen: Secondary | ICD-10-CM | POA: Insufficient documentation

## 2016-11-20 DIAGNOSIS — M25561 Pain in right knee: Secondary | ICD-10-CM | POA: Insufficient documentation

## 2016-11-20 DIAGNOSIS — M6281 Muscle weakness (generalized): Secondary | ICD-10-CM | POA: Insufficient documentation

## 2016-11-20 DIAGNOSIS — E785 Hyperlipidemia, unspecified: Secondary | ICD-10-CM | POA: Insufficient documentation

## 2016-11-20 LAB — CBC
HEMATOCRIT: 39.4 % (ref 36.0–46.0)
HEMOGLOBIN: 12.4 g/dL (ref 12.0–15.0)
MCH: 27.6 pg (ref 26.0–34.0)
MCHC: 31.5 g/dL (ref 30.0–36.0)
MCV: 87.6 fL (ref 78.0–100.0)
Platelets: 213 10*3/uL (ref 150–400)
RBC: 4.5 MIL/uL (ref 3.87–5.11)
RDW: 15.1 % (ref 11.5–15.5)
WBC: 6.7 10*3/uL (ref 4.0–10.5)

## 2016-11-20 LAB — BASIC METABOLIC PANEL
Anion gap: 7 (ref 5–15)
BUN: 20 mg/dL (ref 6–20)
CHLORIDE: 106 mmol/L (ref 101–111)
CO2: 26 mmol/L (ref 22–32)
CREATININE: 0.64 mg/dL (ref 0.44–1.00)
Calcium: 8.7 mg/dL — ABNORMAL LOW (ref 8.9–10.3)
GFR calc non Af Amer: >60 mL/min (ref >60)
Glucose, Bld: 96 mg/dL (ref 65–99)
POTASSIUM: 3.9 mmol/L (ref 3.5–5.1)
Sodium: 139 mmol/L (ref 135–145)

## 2016-11-22 NOTE — Progress Notes (Signed)
This is documentation of my visit of 11/11/2016. I dictated her note by the telephone system after I saw her at the skilled care facility but the note is not available. When I went to see her at the skilled care facility she was actually in the dining room participating in a church service. I moved her to the back of the dining room and we discussed her situation. She says she still having a lot of pain in her knee. I discussed with nursing staff having orthopedics to see her. Otherwise she is doing okay. No other new complaints. At baseline she has carotid artery disease orthostatic hypotension atrial fib and chronic shortness of breath. She also has hypertension.  I reviewed medications and vital signs.  Exam shows she is sitting quietly in a wheelchair. Her knee is slightly swollen on the right. Heart shows that she is in atrial fib. She is awake and alert and fully oriented and able to participate in her care. Her abdomen is soft. She does not have any edema. I did not have her stand. Her lungs are clear.  She has severe arthritic problems and that's why she is in the skilled care facility and I don't think she is going to be able to return home. She has chronic atrial fib which is stable. She had come off anticoagulants because of multiple falls and also because she had some GI bleeding. She has severe arthritis as mentioned. She has chronic shortness of breath.  Plan is for potential orthopedic consultation. Continue other treatments. I don't think she is going to be able to be discharged from the skilled care facility

## 2016-12-06 ENCOUNTER — Inpatient Hospital Stay (HOSPITAL_COMMUNITY): Payer: Medicare Other

## 2016-12-06 ENCOUNTER — Emergency Department (HOSPITAL_COMMUNITY): Payer: Medicare Other

## 2016-12-06 ENCOUNTER — Inpatient Hospital Stay (HOSPITAL_COMMUNITY)
Admission: EM | Admit: 2016-12-06 | Discharge: 2016-12-08 | DRG: 064 | Disposition: A | Discharge status: Skilled Nursing Facility | Payer: Medicare Other | Attending: Pulmonary Disease | Admitting: Pulmonary Disease

## 2016-12-06 ENCOUNTER — Encounter (HOSPITAL_COMMUNITY): Payer: Self-pay | Admitting: *Deleted

## 2016-12-06 DIAGNOSIS — Z9181 History of falling: Secondary | ICD-10-CM | POA: Diagnosis not present

## 2016-12-06 DIAGNOSIS — Z79899 Other long term (current) drug therapy: Secondary | ICD-10-CM | POA: Diagnosis not present

## 2016-12-06 DIAGNOSIS — Z825 Family history of asthma and other chronic lower respiratory diseases: Secondary | ICD-10-CM

## 2016-12-06 DIAGNOSIS — M1990 Unspecified osteoarthritis, unspecified site: Secondary | ICD-10-CM | POA: Diagnosis present

## 2016-12-06 DIAGNOSIS — I110 Hypertensive heart disease with heart failure: Secondary | ICD-10-CM | POA: Diagnosis present

## 2016-12-06 DIAGNOSIS — I482 Chronic atrial fibrillation: Secondary | ICD-10-CM | POA: Diagnosis present

## 2016-12-06 DIAGNOSIS — Z8249 Family history of ischemic heart disease and other diseases of the circulatory system: Secondary | ICD-10-CM | POA: Diagnosis not present

## 2016-12-06 DIAGNOSIS — I5032 Chronic diastolic (congestive) heart failure: Secondary | ICD-10-CM | POA: Diagnosis present

## 2016-12-06 DIAGNOSIS — R262 Difficulty in walking, not elsewhere classified: Secondary | ICD-10-CM

## 2016-12-06 DIAGNOSIS — E785 Hyperlipidemia, unspecified: Secondary | ICD-10-CM | POA: Diagnosis present

## 2016-12-06 DIAGNOSIS — R296 Repeated falls: Secondary | ICD-10-CM | POA: Diagnosis present

## 2016-12-06 DIAGNOSIS — G9349 Other encephalopathy: Secondary | ICD-10-CM | POA: Diagnosis present

## 2016-12-06 DIAGNOSIS — Z66 Do not resuscitate: Secondary | ICD-10-CM | POA: Diagnosis present

## 2016-12-06 DIAGNOSIS — Z823 Family history of stroke: Secondary | ICD-10-CM | POA: Diagnosis not present

## 2016-12-06 DIAGNOSIS — I10 Essential (primary) hypertension: Secondary | ICD-10-CM | POA: Diagnosis not present

## 2016-12-06 DIAGNOSIS — Z9889 Other specified postprocedural states: Secondary | ICD-10-CM

## 2016-12-06 DIAGNOSIS — R4701 Aphasia: Secondary | ICD-10-CM | POA: Diagnosis present

## 2016-12-06 DIAGNOSIS — H9190 Unspecified hearing loss, unspecified ear: Secondary | ICD-10-CM | POA: Diagnosis present

## 2016-12-06 DIAGNOSIS — Z96653 Presence of artificial knee joint, bilateral: Secondary | ICD-10-CM | POA: Diagnosis present

## 2016-12-06 DIAGNOSIS — I4891 Unspecified atrial fibrillation: Secondary | ICD-10-CM | POA: Diagnosis present

## 2016-12-06 DIAGNOSIS — I63412 Cerebral infarction due to embolism of left middle cerebral artery: Principal | ICD-10-CM | POA: Diagnosis present

## 2016-12-06 DIAGNOSIS — Z8673 Personal history of transient ischemic attack (TIA), and cerebral infarction without residual deficits: Secondary | ICD-10-CM | POA: Diagnosis not present

## 2016-12-06 DIAGNOSIS — Z7951 Long term (current) use of inhaled steroids: Secondary | ICD-10-CM

## 2016-12-06 DIAGNOSIS — I639 Cerebral infarction, unspecified: Secondary | ICD-10-CM

## 2016-12-06 DIAGNOSIS — Z882 Allergy status to sulfonamides status: Secondary | ICD-10-CM

## 2016-12-06 DIAGNOSIS — Z9981 Dependence on supplemental oxygen: Secondary | ICD-10-CM

## 2016-12-06 DIAGNOSIS — Z7982 Long term (current) use of aspirin: Secondary | ICD-10-CM

## 2016-12-06 LAB — URINALYSIS, MICROSCOPIC (REFLEX)
BACTERIA UA: NONE SEEN
RBC / HPF: NONE SEEN RBC/hpf (ref 0–5)

## 2016-12-06 LAB — I-STAT CHEM 8, ED
BUN: 16 mg/dL (ref 6–20)
CALCIUM ION: 1.14 mmol/L — AB (ref 1.15–1.40)
CREATININE: 0.7 mg/dL (ref 0.44–1.00)
Chloride: 107 mmol/L (ref 101–111)
GLUCOSE: 93 mg/dL (ref 65–99)
HCT: 43 % (ref 36.0–46.0)
HEMOGLOBIN: 14.6 g/dL (ref 12.0–15.0)
Potassium: 3.8 mmol/L (ref 3.5–5.1)
Sodium: 141 mmol/L (ref 135–145)
TCO2: 21 mmol/L (ref 0–100)

## 2016-12-06 LAB — RAPID URINE DRUG SCREEN, HOSP PERFORMED
Amphetamines: NOT DETECTED
Barbiturates: NOT DETECTED
Benzodiazepines: NOT DETECTED
Cocaine: NOT DETECTED
Opiates: NOT DETECTED
Tetrahydrocannabinol: NOT DETECTED

## 2016-12-06 LAB — CBC
HEMATOCRIT: 43.4 % (ref 36.0–46.0)
Hemoglobin: 14.2 g/dL (ref 12.0–15.0)
MCH: 28.5 pg (ref 26.0–34.0)
MCHC: 32.7 g/dL (ref 30.0–36.0)
MCV: 87.1 fL (ref 78.0–100.0)
Platelets: 228 10*3/uL (ref 150–400)
RBC: 4.98 MIL/uL (ref 3.87–5.11)
RDW: 14.9 % (ref 11.5–15.5)
WBC: 9.7 10*3/uL (ref 4.0–10.5)

## 2016-12-06 LAB — COMPREHENSIVE METABOLIC PANEL
ALK PHOS: 99 U/L (ref 38–126)
ALT: 16 U/L (ref 14–54)
ANION GAP: 10 (ref 5–15)
AST: 23 U/L (ref 15–41)
Albumin: 3.8 g/dL (ref 3.5–5.0)
BUN: 17 mg/dL (ref 6–20)
CALCIUM: 9.3 mg/dL (ref 8.9–10.3)
CHLORIDE: 106 mmol/L (ref 101–111)
CO2: 22 mmol/L (ref 22–32)
CREATININE: 0.71 mg/dL (ref 0.44–1.00)
Glucose, Bld: 98 mg/dL (ref 65–99)
Potassium: 3.8 mmol/L (ref 3.5–5.1)
Sodium: 138 mmol/L (ref 135–145)
Total Bilirubin: 1.2 mg/dL (ref 0.3–1.2)
Total Protein: 6.8 g/dL (ref 6.5–8.1)

## 2016-12-06 LAB — URINALYSIS, ROUTINE W REFLEX MICROSCOPIC
Bilirubin Urine: NEGATIVE
Glucose, UA: NEGATIVE mg/dL
Hgb urine dipstick: NEGATIVE
Ketones, ur: NEGATIVE mg/dL
NITRITE: NEGATIVE
Protein, ur: NEGATIVE mg/dL
SPECIFIC GRAVITY, URINE: 1.01 (ref 1.005–1.030)
pH: 7 (ref 5.0–8.0)

## 2016-12-06 LAB — DIFFERENTIAL
BASOS PCT: 0 %
Basophils Absolute: 0 10*3/uL (ref 0.0–0.1)
EOS PCT: 1 %
Eosinophils Absolute: 0.1 10*3/uL (ref 0.0–0.7)
Lymphocytes Relative: 28 %
Lymphs Abs: 2.7 10*3/uL (ref 0.7–4.0)
MONO ABS: 0.8 10*3/uL (ref 0.1–1.0)
MONOS PCT: 9 %
NEUTROS ABS: 6 10*3/uL (ref 1.7–7.7)
Neutrophils Relative %: 62 %

## 2016-12-06 LAB — BRAIN NATRIURETIC PEPTIDE: B Natriuretic Peptide: 215 pg/mL — ABNORMAL HIGH (ref 0.0–100.0)

## 2016-12-06 LAB — PROTIME-INR
INR: 1.03
PROTHROMBIN TIME: 13.5 s (ref 11.4–15.2)

## 2016-12-06 LAB — I-STAT TROPONIN, ED: TROPONIN I, POC: 0.01 ng/mL (ref 0.00–0.08)

## 2016-12-06 LAB — MRSA PCR SCREENING: MRSA BY PCR: NEGATIVE

## 2016-12-06 LAB — APTT: aPTT: 28 seconds (ref 24–36)

## 2016-12-06 MED ORDER — STROKE: EARLY STAGES OF RECOVERY BOOK
Freq: Once | Status: AC
  Administered 2016-12-06: 14:00:00
  Filled 2016-12-06: qty 1

## 2016-12-06 MED ORDER — SENNOSIDES-DOCUSATE SODIUM 8.6-50 MG PO TABS: 1.0000 | ORAL_TABLET | Freq: Every evening | ORAL | Status: DC | PRN

## 2016-12-06 MED ORDER — ACETAMINOPHEN 325 MG PO TABS
650.0000 mg | ORAL_TABLET | ORAL | Status: DC | PRN
  Administered 2016-12-06 – 2016-12-08 (×5): 650 mg via ORAL
  Filled 2016-12-06 (×5): qty 2

## 2016-12-06 MED ORDER — SODIUM CHLORIDE 0.9 % IV SOLN
INTRAVENOUS | Status: DC
  Administered 2016-12-06 – 2016-12-08 (×4): via INTRAVENOUS

## 2016-12-06 MED ORDER — TRAMADOL HCL 50 MG PO TABS
50.0000 mg | ORAL_TABLET | Freq: Three times a day (TID) | ORAL | Status: DC | PRN
  Administered 2016-12-06 – 2016-12-08 (×3): 50 mg via ORAL
  Filled 2016-12-06 (×4): qty 1

## 2016-12-06 MED ORDER — IOPAMIDOL (ISOVUE-370) INJECTION 76%
100.0000 mL | Freq: Once | INTRAVENOUS | Status: AC | PRN
  Administered 2016-12-06: 75 mL via INTRAVENOUS

## 2016-12-06 MED ORDER — ASPIRIN 325 MG PO TABS
325.0000 mg | ORAL_TABLET | Freq: Every day | ORAL | Status: DC
  Administered 2016-12-06 – 2016-12-08 (×3): 325 mg via ORAL
  Filled 2016-12-06 (×3): qty 1

## 2016-12-06 MED ORDER — DICLOFENAC SODIUM 1 % TD GEL
2.0000 g | Freq: Four times a day (QID) | TRANSDERMAL | Status: DC | PRN
  Administered 2016-12-06 – 2016-12-08 (×3): 2 g via TOPICAL
  Filled 2016-12-06: qty 100

## 2016-12-06 MED ORDER — ACETAMINOPHEN 650 MG RE SUPP: 650.0000 mg | RECTAL | Status: DC | PRN

## 2016-12-06 MED ORDER — ENOXAPARIN SODIUM 40 MG/0.4ML ~~LOC~~ SOLN
40.0000 mg | SUBCUTANEOUS | Status: DC
  Administered 2016-12-06: 40 mg via SUBCUTANEOUS
  Filled 2016-12-06: qty 0.4

## 2016-12-06 MED ORDER — ACETAMINOPHEN 160 MG/5ML PO SOLN: 650.0000 mg | ORAL | Status: DC | PRN

## 2016-12-06 MED ORDER — ASPIRIN 300 MG RE SUPP: 300.0000 mg | Freq: Every day | RECTAL | Status: DC

## 2016-12-06 NOTE — ED Notes (Signed)
Pt continues to remove pulse ox from finger.

## 2016-12-06 NOTE — Evaluation (Signed)
Clinical/Bedside Swallow Evaluation Patient Details  Name: Wendy Perkins MRN: [id number] Date of Birth: 1919-03-16  Today's Date: 12/06/2016 Time: SLP Start Time (ACUTE ONLY): 1400 SLP Stop Time (ACUTE ONLY): 1430 SLP Time Calculation (min) (ACUTE ONLY): 30 min  Past Medical History:  Past Medical History:  Diagnosis Date  . Atrial fibrillation ([place])   . Breast nodule 11/15/2014  . Breast pain, left 11/15/2014  . Carotid artery disease ([place])   . Dizziness   . Dyspnea    Previous CPX suggesting possible restrictive physiology, respiratory muscle fatigue, diastolic dysfunction  . Essential hypertension, benign   . Hyperlipidemia   . Oxygen dependent    2 liter  . Rib pain on left side 11/15/2014  . Seasonal allergies   . Shingles 05/04/2015  . Toe fracture, left    left big toe   Past Surgical History:  Past Surgical History:  Procedure Laterality Date  . COLONOSCOPY  02/22/2012   Procedure: COLONOSCOPY;  Surgeon: Rogene Houston, MD;  [facility];  Service: Endoscopy;  Laterality: N/A;  100  . KNEE SURGERY     bilateral   HPI:  Wendy Perkins is a 82 y.o. female with h/o a fib not anticoagulated due to frequent falls and bleeding risk, HTN and HLD. She is unable to provide any history; history obtained by EDP and family at bedside. Apparently she woke up at 3:30 and requested assistance going to bathroom and appeared normal at that time. When she woke again at around 8 am she was having difficulty communicating and was sent to the ED for evaluation. MRI has subsequently confirmed an acute CVA and admission has been requested. She was not given TPA due to unknown time of onset and age. Pt failed RN swallow screen in ER and BSE ordered.   MRI shows: Several small acute infarcts in the left MCA territory affecting the operculum and left centrum semiovale. No evidence of associated hemorrhage or mass effect.  Assessment / Plan / Recommendation Clinical Impression  Pt  seen at bedside for clinical swallow evaluation with son and daughter present. Pt is a resident at [facility] and typically consumes a regular diet and thin liquids. Pt with new left CVA with resulting aphasia (expressive deficits are greater than receptive). She is able to follow simple, 1-step commands with 100% accuracy and nod yes/no appropriately. No facial asymmetry noted. Pt able to produce volitional swallow and cough upon SLP verbal request. Pt assessed with ice chips, water, puree, and graham crackers. She was able to self feed without incident (she spontaneously reached with left, non-dominant hand). She was able to use her right, dominant hand with a cue and nodded "yes" that it felt a little weaker than baseline. Her son also reported that she has a previous shoulder injury on the right. Will initiate D3/mech soft and thin liquids with standard aspiration precautions. OK for po meds whole with water. SLP will follow for SLE. Recommend SNF upon discharge. Above to RN and family.    Aspiration Risk  Mild aspiration risk    Diet Recommendation Dysphagia 3 (Mech soft);Thin liquid   Liquid Administration via: Cup Medication Administration: Whole meds with liquid Supervision: Patient able to self feed;Intermittent supervision to cue for compensatory strategies Compensations: Slow rate Postural Changes: Seated upright at 90 degrees;Remain upright for at least 30 minutes after po intake    Other  Recommendations Oral Care Recommendations: Oral care BID;Patient independent with oral care Other Recommendations: Clarify dietary restrictions  Follow up Recommendations Skilled Nursing facility      Frequency and Duration min 2x/week  1 week       Prognosis Prognosis for Safe Diet Advancement: Good      Swallow Study   General Date of Onset: 12/06/16 HPI: Wendy Perkins is a 82 y.o. female with h/o a fib not anticoagulated due to frequent falls and bleeding risk, HTN and HLD. She is unable to  provide any history; history obtained by EDP and family at bedside. Apparently she woke up at 3:30 and requested assistance going to bathroom and appeared normal at that time. When she woke again at around 8 am she was having difficulty communicating and was sent to the ED for evaluation. MRI has subsequently confirmed an acute CVA and admission has been requested. She was not given TPA due to unknown time of onset and age. Type of Study: Bedside Swallow Evaluation Diet Prior to this Study: NPO Temperature Spikes Noted: No Respiratory Status: Room air Behavior/Cognition: Alert;Cooperative;Pleasant mood Oral Cavity Assessment: Within Functional Limits Oral Care Completed by SLP: Yes Vision: Functional for self-feeding Self-Feeding Abilities: Able to feed self;Needs set up Patient Positioning: Upright in bed Baseline Vocal Quality: Normal Volitional Cough: Strong Volitional Swallow: Able to elicit    Oral/Motor/Sensory Function Overall Oral Motor/Sensory Function: Within functional limits   Ice Chips Ice chips: Within functional limits Presentation: Spoon   Thin Liquid Thin Liquid: Within functional limits Presentation: Cup;Self Fed    Nectar Thick Nectar Thick Liquid: Not tested   Honey Thick Honey Thick Liquid: Not tested   Puree Puree: Within functional limits Presentation: Spoon   Solid   Thank you,  Genene Churn, CCC-SLP [phone]    Solid: Within functional limits Presentation: Self Fed        PORTER,DABNEY 12/06/2016,3:12 PM

## 2016-12-06 NOTE — ED Notes (Signed)
Pt keeps taking oxygen tubing out of nose, pt redirected to keep tubing in nose.

## 2016-12-06 NOTE — H&P (Signed)
History and Physical    Wendy Perkins [id number] DOB: [birth year] DOA: 12/06/2016  Referring MD/NP/PA: Brantley Stage, EDP PCP: No primary care provider on file.  Patient coming from: [facility]  Chief Complaint: Aphasia  HPI: Wendy Perkins is a 82 y.o. female with h/o a fib not anticoagulated due to frequent falls and bleeding risk, HTN and HLD. She is unable to provide any history; history obtained by EDP and family at bedside. Apparently she woke up at 3:30 and requested assistance going to bathroom and appeared normal at that time. When she woke again at around 8 am she was having difficulty communicating and was sent to the ED for evaluation. MRI has subsequently confirmed an acute CVA and admission has been requested. She was not given TPA due to unknown time of onset and age.  Past Medical/Surgical History: Past Medical History:  Diagnosis Date  . Atrial fibrillation ([place])   . Breast nodule 11/15/2014  . Breast pain, left 11/15/2014  . Carotid artery disease ([place])   . Dizziness   . Dyspnea    Previous CPX suggesting possible restrictive physiology, respiratory muscle fatigue, diastolic dysfunction  . Essential hypertension, benign   . Hyperlipidemia   . Oxygen dependent    2 liter  . Rib pain on left side 11/15/2014  . Seasonal allergies   . Shingles 05/04/2015  . Toe fracture, left    left big toe    Past Surgical History:  Procedure Laterality Date  . COLONOSCOPY  02/22/2012   Procedure: COLONOSCOPY;  Surgeon: Rogene Houston, MD;  Location: [facility];  Service: Endoscopy;  Laterality: N/A;  100  . KNEE SURGERY     bilateral    Social History:  reports that she has never smoked. She has never used smokeless tobacco. She reports that she does not drink alcohol or use drugs.  Allergies: Allergies  Allergen Reactions  . Sulfur Swelling    Rash also per patient    Family History:  Family History  Problem Relation Age of Onset  . Stroke Mother   .  Pneumonia Father   . Heart disease Father   . Healthy Son   . Glaucoma Daughter   . Emphysema Daughter   . Healthy Daughter   . Other Daughter     had knee replacement  . Healthy Son   . Heart disease Maternal Grandmother     Prior to Admission medications   Medication Sig Start Date End Date Taking? Authorizing Provider  acetaminophen (TYLENOL) 650 MG CR tablet Take 1,300 mg by mouth 3 (three) times daily.     Historical Provider, MD  ANORO ELLIPTA 62.5-25 MCG/INH AEPB Inhale 1 Inhaler into the lungs daily.  10/12/14   Historical Provider, MD  aspirin 81 MG tablet Take 81 mg by mouth daily.    Historical Provider, MD  atorvastatin (LIPITOR) 80 MG tablet Take 1 tablet (80 mg total) by mouth daily at 6 PM. 10/17/15   Sinda Du, MD  cycloSPORINE (RESTASIS) 0.05 % ophthalmic emulsion Place 1 drop into both eyes every 12 (twelve) hours.     Historical Provider, MD  diclofenac sodium (VOLTAREN) 1 % GEL Apply 2 g topically 4 (four) times daily as needed for pain. 08/03/16   Historical Provider, MD  diltiazem (CARDIZEM CD) 180 MG 24 hr capsule TAKE ONE CAPSULE BY MOUTH ONCE DAILY. 05/10/15   Satira Sark, MD  fluticasone (FLONASE) 50 MCG/ACT nasal spray Place 2 sprays into the nose daily  as needed for allergies. Reported on 06/27/2016    Historical Provider, MD  furosemide (LASIX) 20 MG tablet TAKE 1 TABLET BY MOUTH DAILY. 04/25/15   Satira Sark, MD  Multiple Vitamin (MULITIVITAMIN WITH MINERALS) TABS Take 1 tablet by mouth every other day. Takes 1 tab 2-3 times a week    Historical Provider, MD  omeprazole (PRILOSEC) 20 MG capsule Take 20 mg by mouth daily.      Historical Provider, MD  ondansetron (ZOFRAN) 4 MG tablet Take 4 mg by mouth every 6 (six) hours as needed for nausea or vomiting.    Historical Provider, MD  OXYGEN Oxygen @@ 2 L/mn via qhs. Twice a day    Historical Provider, MD  Polyethyl Glycol-Propyl Glycol (SYSTANE OP) Apply 1 drop to eye 3 (three) times daily. For dry  eyes    Historical Provider, MD  potassium chloride SA (K-DUR,KLOR-CON) 20 MEQ tablet TAKE ONE TABLET BY MOUTH EVERY OTHER DAY. TAKE ONE THE DAY YOU TAKE FUROSEMIDE. 06/14/15   Lendon Colonel, NP  PROAIR HFA 108 (90 BASE) MCG/ACT inhaler 2 puffs 3 (three) times daily as needed for wheezing (cough).  05/13/15   Historical Provider, MD  PROCTO-MED HC 2.5 % rectal cream Place 1 application rectally daily as needed.  07/09/16   Historical Provider, MD  traMADol (ULTRAM) 50 MG tablet Take 50 mg by mouth every 8 (eight) hours as needed.    Historical Provider, MD    Review of Systems:  Unable to obtain given current inability to communicate.   Physical Exam: Vitals:   12/06/16 1030 12/06/16 1038 12/06/16 1128 12/06/16 1337  BP: 157/90  (!) 197/70   Pulse:  102 87   Resp: 24 22 18    Temp:   97.5 F (36.4 C)   TempSrc:   Oral   SpO2:  99% 97%   Weight:    71.2 kg (157 lb)  Height:    5\' 3"  (1.6 m)     Constitutional: NAD, calm, comfortable Eyes: PERRL, lids and conjunctivae normal ENMT: Mucous membranes are moist. Posterior pharynx clear of any exudate or lesions.Normal dentition.  Neck: normal, supple, no masses, no thyromegaly Respiratory: clear to auscultation bilaterally, no wheezing, no crackles. Normal respiratory effort. No accessory muscle use.  Cardiovascular: Regular rate and rhythm, no murmurs / rubs / gallops. No extremity edema. 2+ pedal pulses. No carotid bruits.  Abdomen: no tenderness, no masses palpated. No hepatosplenomegaly. Bowel sounds positive.  Musculoskeletal: no clubbing / cyanosis. No joint deformity upper and lower extremities. Good ROM, no contractures. Normal muscle tone.  Skin: no rashes, lesions, ulcers. No induration Neurologic: Unable to verbalize, can follow simple commands  Psychiatric: Unable to assess given current mental state   Labs on Admission: I have personally reviewed the following labs and imaging studies  CBC:  Recent Labs Lab  12/06/16 0615 12/06/16 0638  WBC 9.7  --   NEUTROABS 6.0  --   HGB 14.2 14.6  HCT 43.4 43.0  MCV 87.1  --   PLT 228  --    Basic Metabolic Panel:  Recent Labs Lab 12/06/16 0615 12/06/16 0638  NA 138 141  K 3.8 3.8  CL 106 107  CO2 22  --   GLUCOSE 98 93  BUN 17 16  CREATININE 0.71 0.70  CALCIUM 9.3  --    GFR: Estimated Creatinine Clearance: 39.8 mL/min (by C-G formula based on SCr of 0.7 mg/dL). Liver Function Tests:  Recent Labs Lab 12/06/16 [phone]  AST 23  ALT 16  ALKPHOS 99  BILITOT 1.2  PROT 6.8  ALBUMIN 3.8   No results for input(s): LIPASE, AMYLASE in the last 168 hours. No results for input(s): AMMONIA in the last 168 hours. Coagulation Profile:  Recent Labs Lab 12/06/16 0615  INR 1.03   Cardiac Enzymes: No results for input(s): CKTOTAL, CKMB, CKMBINDEX, TROPONINI in the last 168 hours. BNP (last 3 results) No results for input(s): PROBNP in the last 8760 hours. HbA1C: No results for input(s): HGBA1C in the last 72 hours. CBG: No results for input(s): GLUCAP in the last 168 hours. Lipid Profile: No results for input(s): CHOL, HDL, LDLCALC, TRIG, CHOLHDL, LDLDIRECT in the last 72 hours. Thyroid Function Tests: No results for input(s): TSH, T4TOTAL, FREET4, T3FREE, THYROIDAB in the last 72 hours. Anemia Panel: No results for input(s): VITAMINB12, FOLATE, FERRITIN, TIBC, IRON, RETICCTPCT in the last 72 hours. Urine analysis:    Component Value Date/Time   COLORURINE YELLOW 12/06/2016 0615   APPEARANCEUR CLEAR 12/06/2016 0615   LABSPEC 1.010 12/06/2016 0615   PHURINE 7.0 12/06/2016 0615   GLUCOSEU NEGATIVE 12/06/2016 0615   HGBUR NEGATIVE 12/06/2016 0615   BILIRUBINUR NEGATIVE 12/06/2016 0615   KETONESUR NEGATIVE 12/06/2016 0615   PROTEINUR NEGATIVE 12/06/2016 0615   UROBILINOGEN 1.0 10/13/2015 1351   NITRITE NEGATIVE 12/06/2016 0615   LEUKOCYTESUR SMALL (A) 12/06/2016 0615   Sepsis Labs: @LABRCNTIP (procalcitonin:4,lacticidven:4) )No  results found for this or any previous visit (from the past 240 hour(s)).   Radiological Exams on Admission: Ct Angio Head W Or Wo Contrast  Result Date: 12/06/2016 CLINICAL DATA:  82 year old female code stroke, aphasia. Initial encounter. EXAM: CT ANGIOGRAPHY HEAD AND NECK TECHNIQUE: Multidetector CT imaging of the head and neck was performed using the standard protocol during bolus administration of intravenous contrast. Multiplanar CT image reconstructions and MIPs were obtained to evaluate the vascular anatomy. Carotid stenosis measurements (when applicable) are obtained utilizing NASCET criteria, using the distal internal carotid diameter as the denominator. CONTRAST:  75 mL Isovue 370 COMPARISON:  Head CT without contrast 0641 hours today. Brain MRI 10/14/2015. Cervical spine CT 11/08/2008. FINDINGS: CTA NECK Skeleton: Mild progression of degenerative cervical spine spondylolisthesis since 2009. 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 Upper thoracic spine mild scoliosis, and endplate degeneration. No acute osseous abnormality identified. Visualized paranasal sinuses and mastoids are stable and well pneumatized. Upper chest: Negative. Other neck: Thyroid, larynx, pharynx, parapharyngeal spaces, retropharyngeal space, sublingual space, submandibular glands and parotid glands are within normal limits. No cervical lymphadenopathy. Aortic arch: 3 vessel arch configuration with moderate mostly calcified arch and proximal great vessel atherosclerosis. Right carotid system: No brachiocephalic artery or right CCA origin stenosis despite atherosclerosis. Tortuous proximal right CCA with a kinked appearance at the thoracic inlet abutting the tracheoesophageal groove (series 13, image 30). No other right CCA stenosis proximal to the bifurcation. Soft and calcified plaque at the right carotid bifurcation affecting the right ICA origin and bulb.  Stenosis up to 60 % with respect to the distal vessel occurs. Otherwise negative cervical right ICA. Left carotid system: No left CCA origin stenosis despite calcified plaque. Mildly tortuous proximal left CCA. Anterior left CCA calcified plaque at the level of the hypopharynx. At the left carotid bifurcation there is bulky calcified plaque resulting in high-grade stenosis of the proximal left ICA, numerically estimated at 70. The left ICA remains patent and distal to the bulb the cervical left ICA  is negative. % with respect to the distal vessel Vertebral arteries: No proximal right subclavian artery stenosis despite soft and calcified plaque. The right vertebral artery is non dominant. The right vertebral artery origin is occluded along with soft and calcified plaque, but there is some reconstituted flow in the right V1 segment, and intermittently in the right V2 segment, although the vessel appears mostly occluded between the C1 and C4 levels. There is reconstituted flow again in the right V3 segment such that the vessel appears patent at the skullbase. No proximal left subclavian artery hemodynamically significant stenosis despite calcified and bulky soft atherosclerotic plaque (series 9, image 275). The left vertebral artery is dominant. The left vertebral artery origin is patent with mild stenosis. The left V1 segment is tortuous. The left V2 segment is tortuous. There is no other left vertebral artery stenosis to the skullbase. CTA HEAD Posterior circulation: Dominant left vertebral artery primarily supplies the basilar with minimal V4 segment calcified plaque and no stenosis. Patent left PICA origin. The non dominant distal right vertebral artery is irregular and functionally terminates in the right PICA 0 which is patent. The basilar artery is patent with mild irregularity and no stenosis. The SCA and PCA origins are patent. Posterior communicating arteries are diminutive or absent. The right PCA branches  are within normal limits. There is mild to moderate left PCA P2 segment irregularity and stenosis. There is severe stenosis or focal occlusion in the distal left PCA just proximal to the P3 bifurcation best seen on series 12, image 19. There is distal left PCA enhancement. Anterior circulation: Both ICA siphons are patent. There is bilateral ICA siphon calcified plaque. There is subsequent mild multifocal right siphon stenosis. There is minimal to mild left siphon stenosis. Both ophthalmic artery origins are normal. Both carotid termini are patent. The MCA and ACA origins are patent. The anterior communicating artery is diminutive or absent. The bilateral PCA branches are normal. Right MCA M1 segment, bifurcation, and right MCA branches are within normal limits. Left MCA M1 segment, bifurcation, and left MCA branches are within normal limits. No left MCA branch occlusion is identified. Venous sinuses: Patent. Anatomic variants: Dominant left vertebral artery. Delayed phase: Stable gray-white matter differentiation throughout the brain. Small areas of cortical encephalomalacia along the posterior left sylvian fissure appear to be related to ischemia which was acute at the time of the 10/14/2015 MRI. No acute cortically based infarct identified. No abnormal enhancement identified. Review of the MIP images confirms the above findings IMPRESSION: 1. Negative for emergent large vessel occlusion. 2. Positive for hemodynamically significant bilateral ICA origin atherosclerosis related to calcified plaque left (estimated at 70%) greater than right (60%). No significant intracranial anterior circulation stenosis despite bilateral ICA siphon calcified plaque. 3. Positive for intermittent occlusion of the non dominant right vertebral artery. There is reconstituted flow to the right V4 segment which functionally terminates in the right PICA. 4. Positive for severe distal left PCA stenosis (distal P2/P3 level). 5. Dominant left  vertebral artery with only mild origin stenosis. 6. Stable CT appearance of the brain from earlier today. Electronically Signed   By: Genevie Ann M.D.   On: 12/06/2016 08:29   Dg Chest 2 View  Result Date: 12/06/2016 CLINICAL DATA:  Aphasia EXAM: CHEST  2 VIEW COMPARISON:  November 15, 2014 FINDINGS: There is interstitial edema in the base regions. There is no airspace consolidation or volume loss. There is cardiomegaly with mild pulmonary venous hypertension. There is atherosclerotic calcification in the  aorta. No adenopathy. There is degenerative change in each shoulder. Bones are osteoporotic. IMPRESSION: Evidence of a degree of congestive heart failure. No airspace consolidation. There is aortic atherosclerosis. Electronically Signed   By: Lowella Grip III M.D.   On: 12/06/2016 08:21   Ct Angio Neck W And/or Wo Contrast  Result Date: 12/06/2016 CLINICAL DATA:  82 year old female code stroke, aphasia. Initial encounter. EXAM: CT ANGIOGRAPHY HEAD AND NECK TECHNIQUE: Multidetector CT imaging of the head and neck was performed using the standard protocol during bolus administration of intravenous contrast. Multiplanar CT image reconstructions and MIPs were obtained to evaluate the vascular anatomy. Carotid stenosis measurements (when applicable) are obtained utilizing NASCET criteria, using the distal internal carotid diameter as the denominator. CONTRAST:  75 mL Isovue 370 COMPARISON:  Head CT without contrast 0641 hours today. Brain MRI 10/14/2015. Cervical spine CT 11/08/2008. FINDINGS: CTA NECK Skeleton: Mild progression of degenerative cervical spine spondylolisthesis since 2009. 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 Upper thoracic spine mild scoliosis, and endplate degeneration. No acute osseous abnormality identified. Visualized paranasal sinuses and mastoids are stable and well pneumatized. Upper chest: Negative. Other neck:  Thyroid, larynx, pharynx, parapharyngeal spaces, retropharyngeal space, sublingual space, submandibular glands and parotid glands are within normal limits. No cervical lymphadenopathy. Aortic arch: 3 vessel arch configuration with moderate mostly calcified arch and proximal great vessel atherosclerosis. Right carotid system: No brachiocephalic artery or right CCA origin stenosis despite atherosclerosis. Tortuous proximal right CCA with a kinked appearance at the thoracic inlet abutting the tracheoesophageal groove (series 13, image 30). No other right CCA stenosis proximal to the bifurcation. Soft and calcified plaque at the right carotid bifurcation affecting the right ICA origin and bulb. Stenosis up to 60 % with respect to the distal vessel occurs. Otherwise negative cervical right ICA. Left carotid system: No left CCA origin stenosis despite calcified plaque. Mildly tortuous proximal left CCA. Anterior left CCA calcified plaque at the level of the hypopharynx. At the left carotid bifurcation there is bulky calcified plaque resulting in high-grade stenosis of the proximal left ICA, numerically estimated at 70. The left ICA remains patent and distal to the bulb the cervical left ICA is negative. % with respect to the distal vessel Vertebral arteries: No proximal right subclavian artery stenosis despite soft and calcified plaque. The right vertebral artery is non dominant. The right vertebral artery origin is occluded along with soft and calcified plaque, but there is some reconstituted flow in the right V1 segment, and intermittently in the right V2 segment, although the vessel appears mostly occluded between the C1 and C4 levels. There is reconstituted flow again in the right V3 segment such that the vessel appears patent at the skullbase. No proximal left subclavian artery hemodynamically significant stenosis despite calcified and bulky soft atherosclerotic plaque (series 9, image 275). The left vertebral artery  is dominant. The left vertebral artery origin is patent with mild stenosis. The left V1 segment is tortuous. The left V2 segment is tortuous. There is no other left vertebral artery stenosis to the skullbase. CTA HEAD Posterior circulation: Dominant left vertebral artery primarily supplies the basilar with minimal V4 segment calcified plaque and no stenosis. Patent left PICA origin. The non dominant distal right vertebral artery is irregular and functionally terminates in the right PICA 0 which is patent. The basilar artery is patent with mild irregularity and no stenosis. The SCA and PCA origins are patent. Posterior communicating arteries  are diminutive or absent. The right PCA branches are within normal limits. There is mild to moderate left PCA P2 segment irregularity and stenosis. There is severe stenosis or focal occlusion in the distal left PCA just proximal to the P3 bifurcation best seen on series 12, image 19. There is distal left PCA enhancement. Anterior circulation: Both ICA siphons are patent. There is bilateral ICA siphon calcified plaque. There is subsequent mild multifocal right siphon stenosis. There is minimal to mild left siphon stenosis. Both ophthalmic artery origins are normal. Both carotid termini are patent. The MCA and ACA origins are patent. The anterior communicating artery is diminutive or absent. The bilateral PCA branches are normal. Right MCA M1 segment, bifurcation, and right MCA branches are within normal limits. Left MCA M1 segment, bifurcation, and left MCA branches are within normal limits. No left MCA branch occlusion is identified. Venous sinuses: Patent. Anatomic variants: Dominant left vertebral artery. Delayed phase: Stable gray-white matter differentiation throughout the brain. Small areas of cortical encephalomalacia along the posterior left sylvian fissure appear to be related to ischemia which was acute at the time of the 10/14/2015 MRI. No acute cortically based infarct  identified. No abnormal enhancement identified. Review of the MIP images confirms the above findings IMPRESSION: 1. Negative for emergent large vessel occlusion. 2. Positive for hemodynamically significant bilateral ICA origin atherosclerosis related to calcified plaque left (estimated at 70%) greater than right (60%). No significant intracranial anterior circulation stenosis despite bilateral ICA siphon calcified plaque. 3. Positive for intermittent occlusion of the non dominant right vertebral artery. There is reconstituted flow to the right V4 segment which functionally terminates in the right PICA. 4. Positive for severe distal left PCA stenosis (distal P2/P3 level). 5. Dominant left vertebral artery with only mild origin stenosis. 6. Stable CT appearance of the brain from earlier today. Electronically Signed   By: Genevie Ann M.D.   On: 12/06/2016 08:29   Mr Brain Wo Contrast  Result Date: 12/06/2016 CLINICAL DATA:  82 year old female with altered mental status, confusion, aphasia. Code stroke. Initial encounter. EXAM: MRI HEAD WITHOUT CONTRAST TECHNIQUE: Multiplanar, multiecho pulse sequences of the brain and surrounding structures were obtained without intravenous contrast. COMPARISON:  CTA head and neck and noncontrast head CT earlier today. Brain MRI 10/14/2015. FINDINGS: The examination had to be discontinued prior to completion due to patient agitation, lack of cooperation. Diffusion, sagittal T1 weighted and axial T2 weighted imaging only was obtained. Brain: Axial in coronal diffusion weighted imaging is obtained demonstrating several small areas (up to 12 mm) of restricted diffusion in the left MCA territory affecting the left operculum and white matter of the left centrum semiovale (series 3 images 87 and 96. No contralateral or posterior fossa restricted diffusion. No mass effect. No evidence of associated hemorrhage. No ventriculomegaly. Stable gray and white matter signal otherwise. Vascular:  Major intracranial vascular flow voids are stable since 2016. Skull and upper cervical spine: Chronic degenerative changes at the skullbase and upper cervical spine have mildly progressed since 2016. Sinuses/Orbits: Visualized paranasal sinuses and mastoids are stable and well pneumatized. Stable orbit and scalp soft tissues. IMPRESSION: 1. Several small acute infarcts in the left MCA territory affecting the operculum and left centrum semiovale. No evidence of associated hemorrhage or mass effect. 2. No other acute intracranial abnormality identified. 3.  The examination had to be discontinued prior to completion. Electronically Signed   By: Genevie Ann M.D.   On: 12/06/2016 11:22   Ct Head Code Stroke W/o Cm  Result Date: 12/06/2016 CLINICAL DATA:  Code stroke.  Aphasia.  Unknown time last seen well. EXAM: CT HEAD WITHOUT CONTRAST TECHNIQUE: Contiguous axial images were obtained from the base of the skull through the vertex without intravenous contrast. COMPARISON:  CT head 10/13/2015.  MRI brain 10/14/2015. FINDINGS: Brain: Remote left parietal and basal ganglia infarcts are noted. Remote right parietal lobe infarcts are noted as well. No acute cortical infarct is present. There is no acute hemorrhage or mass lesion. The ventricles are proportionate to the degree of atrophy. No significant extra-axial fluid collection is present. The brainstem and cerebellum are normal. Vascular: Atherosclerotic calcifications are again seen. There is no hyperdense vessel. Skull: The calvarium is intact. Sinuses/Orbits: The paranasal sinuses are clear. There is minimal fluid in the left mastoid air cells. No obstructing nasopharyngeal lesion is present. Bilateral lens replacements are again noted. The globes and orbits are otherwise unremarkable. ASPECTS [facility] Stroke Program Early CT Score) - Ganglionic level infarction (caudate, lentiform nuclei, internal capsule, insula, M1-M3 cortex): 7/7 - Supraganglionic infarction (M4-M6  cortex): 3/3 Total score (0-10 with 10 being normal): 10/10 IMPRESSION: 1. No acute intracranial abnormality. 2. Remote bilateral parietal lobe infarcts. 3. Remote left caudate head infarct. 4. Diffuse atrophy and white matter disease is similar to the prior exam. 5. Atherosclerosis without a hyperdense vessel. 6. No acute intracranial abnormality. 7. ASPECTS is 10/10 These results were called by telephone at the time of interpretation on 12/06/2016 at 6:57 am to Dr. Brantley Stage , who verbally acknowledged these results. Electronically Signed   By: San Morelle M.D.   On: 12/06/2016 06:58    EKG: Independently reviewed. A fib, rate controlled, no acute ischemic changes  Assessment/Plan Principal Problem:   Acute CVA (cerebrovascular accident) ([place]) Active Problems:   Essential hypertension, benign   Atrial fibrillation (HCC)   Chronic diastolic heart failure (HCC)   Aphasia    Acute Embolic CVA -Manifested mainly with aphasia. -Due to a fib history; not anticoagulated as she has been deemed a fall risk in the past. -ASA PR for now. -Failed bedside swallow evaluation and will need a formal ST consultation. -Request PT/OT/ST consults. -Discussed at length at bedside with 2 sons and daughters. We have decided to not perform any testing including carotid dopplers and ECHO given her advanced age. -Will request neuro consultation. -Will need rehab at SNF upon DC.  A Fib -Currently rate controlled. -Has been deemed high risk for anticoagulation in the past.  HTN -Allow permissive HTN for now.  Hyperlipidemia -Resume statin once PO route cleared.   DVT prophylaxis: lovenox  Code Status: DNR  Family Communication: children at bedside on plan of care and all questions answered Disposition Plan: back to SNF in 24-48 hours  Consults called: neurology  Admission status: inpatient    Time Spent: 75 minutes  Lelon Frohlich MD [facility] Pager [phone]  If  7PM-7AM, please contact night-coverage [email or web address] Password [email or web address]  12/06/2016, 1:48 PM

## 2016-12-06 NOTE — Evaluation (Signed)
Speech Language Pathology Evaluation Patient Details Name: Wendy Perkins MRN: [id number] DOB: 1919-06-06 Today's Date: 12/06/2016 Time: 1900-2000 SLP Time Calculation (min) (ACUTE ONLY): 60 min  Problem List:  Patient Active Problem List   Diagnosis Date Noted  . Aphasia 12/06/2016  . Knee pain, acute 08/06/2016  . Fall 08/06/2016  . Acute CVA (cerebrovascular accident) ([place]) 10/17/2015  . COPD (chronic obstructive pulmonary disease) ([place]) 10/17/2015  . UTI (urinary tract infection) 10/17/2015  . Altered mental state 10/13/2015  . Elevated troponin 10/13/2015  . Shingles 05/04/2015  . Rib pain on left side 11/15/2014  . Breast pain, left 11/15/2014  . Breast nodule 11/15/2014  . Orthostatic hypotension 09/30/2013  . Atrial fibrillation ([place]) 11/23/2011  . Chronic diastolic heart failure ([place]) 11/23/2011  . Essential hypertension, benign 07/13/2010  . DYSPNEA 11/15/2009   Past Medical History:  Past Medical History:  Diagnosis Date  . Atrial fibrillation ([place])   . Breast nodule 11/15/2014  . Breast pain, left 11/15/2014  . Carotid artery disease ([place])   . Dizziness   . Dyspnea    Previous CPX suggesting possible restrictive physiology, respiratory muscle fatigue, diastolic dysfunction  . Essential hypertension, benign   . Hyperlipidemia   . Oxygen dependent    2 liter  . Rib pain on left side 11/15/2014  . Seasonal allergies   . Shingles 05/04/2015  . Toe fracture, left    left big toe   Past Surgical History:  Past Surgical History:  Procedure Laterality Date  . COLONOSCOPY  02/22/2012   Procedure: COLONOSCOPY;  Surgeon: Rogene Houston, MD;  Location: [facility];  Service: Endoscopy;  Laterality: N/A;  100  . KNEE SURGERY     bilateral   HPI:  Wendy Perkins is a 82 y.o. female with h/o a fib not anticoagulated due to frequent falls and bleeding risk, HTN and HLD. She is unable to provide any history; history obtained by EDP and family at bedside. Apparently  she woke up at 3:30 and requested assistance going to bathroom and appeared normal at that time. When she woke again at around 8 am she was having difficulty communicating and was sent to the ED for evaluation. MRI has subsequently confirmed an acute CVA and admission has been requested. She was not given TPA due to unknown time of onset and age.   Assessment / Plan / Recommendation Clinical Impression  Pt seen at bedside for SLE with family members present. Pt presents with severe expressive aphasia characterized by limited intelligible verbal output, anomia, paraphasic errors, and perseverative responses. Pt with min receptive aphasia with difficulty with complex yes/no questions. Suspect some element of apraxia as evidenced by groping with verbal attempts. Pt demonstrates relative strengths in repetition of single words, automatic speech tasks, and imitation of gestures. Pt has difficulty initiating speech tasks and benefits from verbal and written prompts. Suspect mild right inattention as Pt required visual cues to locate single words on the right side of the page. Pt has a communication board at bedside, however it was frustrating for Pt to try to use. SLP encouraged family to ask Pt yes/no questions and SLP provided list of questions to ask her at bedside (Are you in pain? Are you thirsty? Do you need to go to the bathroom?) to help decrease frustration from both pt and family. Pt also encouraged to attempt to gesture and was able to return demonstrate after SLP model gestures for hot, cold, pain, eat, drink, happy, sad. Pt's son  had numerous questions and verbalized feeling very worried about his mother. Family will need ongoing education/support in regards to Pt's recovery. Recommend SNF as soon as medically possible to have pt return to her routine. She was a delight to work with and should do very well in speech therapy.     SLP Assessment  Patient needs continued Speech Lanaguage Pathology  Services    Follow Up Recommendations  Skilled Nursing facility    Frequency and Duration min 2x/week  1 week      SLP Evaluation Cognition  Overall Cognitive Status: Difficult to assess (secondary to expressive aphasia) Arousal/Alertness: Awake/alert Orientation Level: Oriented to person;Oriented to place;Oriented to situation Memory: Appears intact Awareness: Appears intact Problem Solving: Appears intact       Comprehension  Auditory Comprehension Overall Auditory Comprehension: Impaired Yes/No Questions: Impaired Basic Biographical Questions: 76-100% accurate Complex Questions: 50-74% accurate Commands: Within Functional Limits [facility] for basic 1 and 2 step commands) Conversation: Simple Interfering Components: Hearing (has hearing aids) EffectiveTechniques: Increased volume;Repetition;Stressing words Visual Recognition/Discrimination Discrimination: Within Function Limits Reading Comprehension Reading Status: Impaired Word level: Impaired (60%) Sentence Level: Not tested Paragraph Level: Not tested Functional Environmental (signs, name badge): Not tested Effective Techniques: Eye glasses;Large print;Verbal cueing;Visual cueing (? some right inattention)    Expression Expression Primary Mode of Expression: Verbal Verbal Expression Overall Verbal Expression: Impaired Initiation: Impaired Automatic Speech: Social Response;Counting Level of Generative/Spontaneous Verbalization: Word Repetition: Impaired Level of Impairment: Word level (word level for bisyllabic words=70%) Naming: Impairment Responsive: 0-25% accurate Confrontation: Impaired Convergent: 0-24% accurate Divergent: 0-24% accurate Verbal Errors: Phonemic paraphasias;Perseveration;Not aware of errors;Aware of errors Pragmatics: No impairment Effective Techniques: Written cues (sentence completion and automatic phrases paired with gesture) Non-Verbal Means of Communication: Not  applicable;Gestures Written Expression Dominant Hand: Right Written Expression: Not tested   Oral / Motor  Oral Motor/Sensory Function Overall Oral Motor/Sensory Function: Within functional limits Motor Speech Respiration: Within functional limits Phonation: Normal Resonance: Within functional limits Articulation: Within functional limitis Intelligibility: Intelligibility reduced Word: 50-74% accurate Motor Planning: Impaired (needs further assessment) Motor Speech Errors: Groping for words   Thank you,  Genene Churn, [place]                     [place] 12/06/2016, 9:06 PM

## 2016-12-06 NOTE — ED Notes (Signed)
Notified morgan, rn that pt will go to MRI before the floor and ED staff will transport pt from MRI to 300 floor.

## 2016-12-06 NOTE — ED Notes (Signed)
Pt taken to CT by maryann.

## 2016-12-06 NOTE — ED Notes (Signed)
[place] paged @ [phone] [facility] notified @  [phone]

## 2016-12-06 NOTE — ED Triage Notes (Signed)
Pt sent from the  [facility] by EMS to be evaluated. Was stated staff says pt not talking as normal. Unknown last know well. Pt non verbal at this time & pt follows commands.

## 2016-12-06 NOTE — ED Notes (Addendum)
Pt taken to MRI by maryann. Audelia Acton will call when pt is ready to be taken upstairs.

## 2016-12-06 NOTE — ED Notes (Signed)
Pt placed on bedpan to urinate, bedpan removed and emptied.

## 2016-12-06 NOTE — ED Provider Notes (Signed)
[place] DEPT Provider Note   CSN: [id number] Arrival date & time: 12/06/16  0603     History   Chief Complaint Chief Complaint  Patient presents with  . Aphasia    HPI Wendy Perkins is a 82 y.o. female.  HPI Level V caveat. Patient unable to speak.  History of chronic atrial fibrillation, not on anticoagulation, HTN, HLD, carotid artery disease, chronic diastolic heart failure.   Per staff at [facility], she is normally hard of hearing but talkative and able to have conversation. Went to bed at 9:30-10PM last night per tech/nurse at [facility]. At around 3:30-4 AM patient got up to use the bathroom, and staff walked her back to the room. She said that she wanted to go to bed, and at the time seemed like her normal self. Later this morning, when staff was assisting patient, they noticed that she was not able to talk. She was moving all extremities per staff and had equal hand grip. Denies any recent illness.   Past Medical History:  Diagnosis Date  . Atrial fibrillation ([place])   . Breast nodule 11/15/2014  . Breast pain, left 11/15/2014  . Carotid artery disease ([place])   . Dizziness   . Dyspnea    Previous CPX suggesting possible restrictive physiology, respiratory muscle fatigue, diastolic dysfunction  . Essential hypertension, benign   . Hyperlipidemia   . Oxygen dependent    2 liter  . Rib pain on left side 11/15/2014  . Seasonal allergies   . Shingles 05/04/2015  . Toe fracture, left    left big toe    Patient Active Problem List   Diagnosis Date Noted  . Knee pain, acute 08/06/2016  . Fall 08/06/2016  . Embolic stroke ([place]) [id number]  . COPD (chronic obstructive pulmonary disease) ([place]) 10/17/2015  . UTI (urinary tract infection) 10/17/2015  . Altered mental state 10/13/2015  . Elevated troponin 10/13/2015  . Shingles 05/04/2015  . Rib pain on left side 11/15/2014  . Breast pain, left 11/15/2014  . Breast nodule 11/15/2014  . Orthostatic  hypotension 09/30/2013  . Atrial fibrillation ([place]) 11/23/2011  . Chronic diastolic heart failure ([place]) 11/23/2011  . Essential hypertension, benign 07/13/2010  . DYSPNEA 11/15/2009    Past Surgical History:  Procedure Laterality Date  . COLONOSCOPY  02/22/2012   Procedure: COLONOSCOPY;  Surgeon: Rogene Houston, MD;  Location: [facility] ENDO SUITE;  Service: Endoscopy;  Laterality: N/A;  100  . KNEE SURGERY     bilateral    OB History    Gravida Para Term Preterm AB Living   4 4       4    SAB TAB Ectopic Multiple Live Births                   Home Medications    Prior to Admission medications   Medication Sig Start Date End Date Taking? Authorizing Provider  acetaminophen (TYLENOL) 650 MG CR tablet Take 1,300 mg by mouth 3 (three) times daily.     Historical Provider, MD  ANORO ELLIPTA 62.5-25 MCG/INH AEPB Inhale 1 Inhaler into the lungs daily.  10/12/14   Historical Provider, MD  aspirin 81 MG tablet Take 81 mg by mouth daily.    Historical Provider, MD  atorvastatin (LIPITOR) 80 MG tablet Take 1 tablet (80 mg total) by mouth daily at 6 PM. 10/17/15   Sinda Du, MD  cycloSPORINE (RESTASIS) 0.05 % ophthalmic emulsion Place 1 drop into both eyes every 12 (twelve)  hours.     Historical Provider, MD  diclofenac sodium (VOLTAREN) 1 % GEL Apply 2 g topically 4 (four) times daily as needed for pain. 08/03/16   Historical Provider, MD  diltiazem (CARDIZEM CD) 180 MG 24 hr capsule TAKE ONE CAPSULE BY MOUTH ONCE DAILY. 05/10/15   Satira Sark, MD  fluticasone (FLONASE) 50 MCG/ACT nasal spray Place 2 sprays into the nose daily as needed for allergies. Reported on 06/27/2016    Historical Provider, MD  furosemide (LASIX) 20 MG tablet TAKE 1 TABLET BY MOUTH DAILY. 04/25/15   Satira Sark, MD  Multiple Vitamin (MULITIVITAMIN WITH MINERALS) TABS Take 1 tablet by mouth every other day. Takes 1 tab 2-3 times a week    Historical Provider, MD  omeprazole (PRILOSEC) 20 MG capsule Take 20 mg by  mouth daily.      Historical Provider, MD  ondansetron (ZOFRAN) 4 MG tablet Take 4 mg by mouth every 6 (six) hours as needed for nausea or vomiting.    Historical Provider, MD  OXYGEN Oxygen @@ 2 L/mn via qhs. Twice a day    Historical Provider, MD  Polyethyl Glycol-Propyl Glycol (SYSTANE OP) Apply 1 drop to eye 3 (three) times daily. For dry eyes    Historical Provider, MD  potassium chloride SA (K-DUR,KLOR-CON) 20 MEQ tablet TAKE ONE TABLET BY MOUTH EVERY OTHER DAY. TAKE ONE THE DAY YOU TAKE FUROSEMIDE. 06/14/15   Lendon Colonel, NP  PROAIR HFA 108 (90 BASE) MCG/ACT inhaler 2 puffs 3 (three) times daily as needed for wheezing (cough).  05/13/15   Historical Provider, MD  PROCTO-MED HC 2.5 % rectal cream Place 1 application rectally daily as needed.  07/09/16   Historical Provider, MD  traMADol (ULTRAM) 50 MG tablet Take 50 mg by mouth every 8 (eight) hours as needed.    Historical Provider, MD    Family History Family History  Problem Relation Age of Onset  . Stroke Mother   . Pneumonia Father   . Heart disease Father   . Healthy Son   . Glaucoma Daughter   . Emphysema Daughter   . Healthy Daughter   . Other Daughter     had knee replacement  . Healthy Son   . Heart disease Maternal Grandmother     Social History Social History  Substance Use Topics  . Smoking status: Never Smoker  . Smokeless tobacco: Never Used  . Alcohol use No     Allergies   Sulfur   Review of Systems Review of Systems Unable to obtain, patient non-verbal  Physical Exam Updated Vital Signs BP (!) 180/120   Pulse 92   Temp 97.3 F (36.3 C)   Resp 15   Wt 157 lb 10.1 oz (71.5 kg)   SpO2 100%   BMI 27.92 kg/m   Physical Exam  Physical Exam  Nursing note and vitals reviewed. Constitutional:  non-toxic, and in no acute distress Head: Normocephalic and atraumatic.  Mouth/Throat: Oropharynx is clear and moist.  Neck: Normal range of motion. Neck supple.  Cardiovascular: Normal rate and  regular rhythm.   Pulmonary/Chest: Effort normal and breath sounds normal.  Abdominal: Soft. There is no tenderness. There is no rebound and no guarding.  Musculoskeletal: Normal range of motion.  Neurological: Alert, no facial droop, non-verbal, moves all extremities symmetrically, bilateral handgrip equal, responds to touch in all 4 extremities, obeys commands Skin: Skin is warm and dry.  Psychiatric: Cooperative  ED Treatments / Results  Labs (all labs  ordered are listed, but only abnormal results are displayed) Labs Reviewed  URINALYSIS, ROUTINE W REFLEX MICROSCOPIC - Abnormal; Notable for the following:       Result Value   Leukocytes, UA SMALL (*)    All other components within normal limits  URINALYSIS, MICROSCOPIC (REFLEX) - Abnormal; Notable for the following:    Squamous Epithelial / LPF 0-5 (*)    All other components within normal limits  BRAIN NATRIURETIC PEPTIDE - Abnormal; Notable for the following:    B Natriuretic Peptide 215.0 (*)    All other components within normal limits  I-STAT CHEM 8, ED - Abnormal; Notable for the following:    Calcium, Ion 1.14 (*)    All other components within normal limits  PROTIME-INR  APTT  CBC  DIFFERENTIAL  COMPREHENSIVE METABOLIC PANEL  RAPID URINE DRUG SCREEN, HOSP PERFORMED  I-STAT TROPOININ, ED    EKG  EKG Interpretation  Date/Time:  Thursday December 06 2016 06:17:36 EST Ventricular Rate:  87 PR Interval:    QRS Duration: 107 QT Interval:  392 QTC Calculation: 469 R Axis:   -66 Text Interpretation:  Atrial fibrillation Left anterior fascicular block Low voltage, extremity leads Anteroseptal infarct, age indeterminate Nonspecific repol abnormality, lateral leads history of chronic  atrial fibrillation Confirmed by Rene Gonsoulin MD, Earla Charlie [phone]) on 12/06/2016 7:13:09 AM       Radiology Ct Angio Head W Or Wo Contrast  Result Date: 12/06/2016 CLINICAL DATA:  82 year old female code stroke, aphasia. Initial encounter. EXAM:  CT ANGIOGRAPHY HEAD AND NECK TECHNIQUE: Multidetector CT imaging of the head and neck was performed using the standard protocol during bolus administration of intravenous contrast. Multiplanar CT image reconstructions and MIPs were obtained to evaluate the vascular anatomy. Carotid stenosis measurements (when applicable) are obtained utilizing NASCET criteria, using the distal internal carotid diameter as the denominator. CONTRAST:  75 mL Isovue 370 COMPARISON:  Head CT without contrast 0641 hours today. Brain MRI 10/14/2015. Cervical spine CT 11/08/2008. FINDINGS: CTA NECK Skeleton: Mild progression of degenerative cervical spine spondylolisthesis since 2009. 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 Upper thoracic spine mild scoliosis, and endplate degeneration. No acute osseous abnormality identified. Visualized paranasal sinuses and mastoids are stable and well pneumatized. Upper chest: Negative. Other neck: Thyroid, larynx, pharynx, parapharyngeal spaces, retropharyngeal space, sublingual space, submandibular glands and parotid glands are within normal limits. No cervical lymphadenopathy. Aortic arch: 3 vessel arch configuration with moderate mostly calcified arch and proximal great vessel atherosclerosis. Right carotid system: No brachiocephalic artery or right CCA origin stenosis despite atherosclerosis. Tortuous proximal right CCA with a kinked appearance at the thoracic inlet abutting the tracheoesophageal groove (series 13, image 30). No other right CCA stenosis proximal to the bifurcation. Soft and calcified plaque at the right carotid bifurcation affecting the right ICA origin and bulb. Stenosis up to 60 % with respect to the distal vessel occurs. Otherwise negative cervical right ICA. Left carotid system: No left CCA origin stenosis despite calcified plaque. Mildly tortuous proximal left CCA. Anterior left CCA calcified plaque at the  level of the hypopharynx. At the left carotid bifurcation there is bulky calcified plaque resulting in high-grade stenosis of the proximal left ICA, numerically estimated at 70. The left ICA remains patent and distal to the bulb the cervical left ICA is negative. % with respect to the distal vessel Vertebral arteries: No proximal right subclavian artery stenosis despite soft and calcified plaque. The right vertebral  artery is non dominant. The right vertebral artery origin is occluded along with soft and calcified plaque, but there is some reconstituted flow in the right V1 segment, and intermittently in the right V2 segment, although the vessel appears mostly occluded between the C1 and C4 levels. There is reconstituted flow again in the right V3 segment such that the vessel appears patent at the skullbase. No proximal left subclavian artery hemodynamically significant stenosis despite calcified and bulky soft atherosclerotic plaque (series 9, image 275). The left vertebral artery is dominant. The left vertebral artery origin is patent with mild stenosis. The left V1 segment is tortuous. The left V2 segment is tortuous. There is no other left vertebral artery stenosis to the skullbase. CTA HEAD Posterior circulation: Dominant left vertebral artery primarily supplies the basilar with minimal V4 segment calcified plaque and no stenosis. Patent left PICA origin. The non dominant distal right vertebral artery is irregular and functionally terminates in the right PICA 0 which is patent. The basilar artery is patent with mild irregularity and no stenosis. The SCA and PCA origins are patent. Posterior communicating arteries are diminutive or absent. The right PCA branches are within normal limits. There is mild to moderate left PCA P2 segment irregularity and stenosis. There is severe stenosis or focal occlusion in the distal left PCA just proximal to the P3 bifurcation best seen on series 12, image 19. There is distal  left PCA enhancement. Anterior circulation: Both ICA siphons are patent. There is bilateral ICA siphon calcified plaque. There is subsequent mild multifocal right siphon stenosis. There is minimal to mild left siphon stenosis. Both ophthalmic artery origins are normal. Both carotid termini are patent. The MCA and ACA origins are patent. The anterior communicating artery is diminutive or absent. The bilateral PCA branches are normal. Right MCA M1 segment, bifurcation, and right MCA branches are within normal limits. Left MCA M1 segment, bifurcation, and left MCA branches are within normal limits. No left MCA branch occlusion is identified. Venous sinuses: Patent. Anatomic variants: Dominant left vertebral artery. Delayed phase: Stable gray-white matter differentiation throughout the brain. Small areas of cortical encephalomalacia along the posterior left sylvian fissure appear to be related to ischemia which was acute at the time of the 10/14/2015 MRI. No acute cortically based infarct identified. No abnormal enhancement identified. Review of the MIP images confirms the above findings IMPRESSION: 1. Negative for emergent large vessel occlusion. 2. Positive for hemodynamically significant bilateral ICA origin atherosclerosis related to calcified plaque left (estimated at 70%) greater than right (60%). No significant intracranial anterior circulation stenosis despite bilateral ICA siphon calcified plaque. 3. Positive for intermittent occlusion of the non dominant right vertebral artery. There is reconstituted flow to the right V4 segment which functionally terminates in the right PICA. 4. Positive for severe distal left PCA stenosis (distal P2/P3 level). 5. Dominant left vertebral artery with only mild origin stenosis. 6. Stable CT appearance of the brain from earlier today. Electronically Signed   By: Genevie Ann M.D.   On: 12/06/2016 08:29   Dg Chest 2 View  Result Date: 12/06/2016 CLINICAL DATA:  Aphasia EXAM:  CHEST  2 VIEW COMPARISON:  November 15, 2014 FINDINGS: There is interstitial edema in the base regions. There is no airspace consolidation or volume loss. There is cardiomegaly with mild pulmonary venous hypertension. There is atherosclerotic calcification in the aorta. No adenopathy. There is degenerative change in each shoulder. Bones are osteoporotic. IMPRESSION: Evidence of a degree of congestive heart failure. No airspace consolidation.  There is aortic atherosclerosis. Electronically Signed   By: Lowella Grip III M.D.   On: 12/06/2016 08:21   Ct Angio Neck W And/or Wo Contrast  Result Date: 12/06/2016 CLINICAL DATA:  82 year old female code stroke, aphasia. Initial encounter. EXAM: CT ANGIOGRAPHY HEAD AND NECK TECHNIQUE: Multidetector CT imaging of the head and neck was performed using the standard protocol during bolus administration of intravenous contrast. Multiplanar CT image reconstructions and MIPs were obtained to evaluate the vascular anatomy. Carotid stenosis measurements (when applicable) are obtained utilizing NASCET criteria, using the distal internal carotid diameter as the denominator. CONTRAST:  75 mL Isovue 370 COMPARISON:  Head CT without contrast 0641 hours today. Brain MRI 10/14/2015. Cervical spine CT 11/08/2008. FINDINGS: CTA NECK Skeleton: Mild progression of degenerative cervical spine spondylolisthesis since 2009. 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 Upper thoracic spine mild scoliosis, and endplate degeneration. No acute osseous abnormality identified. Visualized paranasal sinuses and mastoids are stable and well pneumatized. Upper chest: Negative. Other neck: Thyroid, larynx, pharynx, parapharyngeal spaces, retropharyngeal space, sublingual space, submandibular glands and parotid glands are within normal limits. No cervical lymphadenopathy. Aortic arch: 3 vessel arch configuration with moderate mostly  calcified arch and proximal great vessel atherosclerosis. Right carotid system: No brachiocephalic artery or right CCA origin stenosis despite atherosclerosis. Tortuous proximal right CCA with a kinked appearance at the thoracic inlet abutting the tracheoesophageal groove (series 13, image 30). No other right CCA stenosis proximal to the bifurcation. Soft and calcified plaque at the right carotid bifurcation affecting the right ICA origin and bulb. Stenosis up to 60 % with respect to the distal vessel occurs. Otherwise negative cervical right ICA. Left carotid system: No left CCA origin stenosis despite calcified plaque. Mildly tortuous proximal left CCA. Anterior left CCA calcified plaque at the level of the hypopharynx. At the left carotid bifurcation there is bulky calcified plaque resulting in high-grade stenosis of the proximal left ICA, numerically estimated at 70. The left ICA remains patent and distal to the bulb the cervical left ICA is negative. % with respect to the distal vessel Vertebral arteries: No proximal right subclavian artery stenosis despite soft and calcified plaque. The right vertebral artery is non dominant. The right vertebral artery origin is occluded along with soft and calcified plaque, but there is some reconstituted flow in the right V1 segment, and intermittently in the right V2 segment, although the vessel appears mostly occluded between the C1 and C4 levels. There is reconstituted flow again in the right V3 segment such that the vessel appears patent at the skullbase. No proximal left subclavian artery hemodynamically significant stenosis despite calcified and bulky soft atherosclerotic plaque (series 9, image 275). The left vertebral artery is dominant. The left vertebral artery origin is patent with mild stenosis. The left V1 segment is tortuous. The left V2 segment is tortuous. There is no other left vertebral artery stenosis to the skullbase. CTA HEAD Posterior circulation:  Dominant left vertebral artery primarily supplies the basilar with minimal V4 segment calcified plaque and no stenosis. Patent left PICA origin. The non dominant distal right vertebral artery is irregular and functionally terminates in the right PICA 0 which is patent. The basilar artery is patent with mild irregularity and no stenosis. The SCA and PCA origins are patent. Posterior communicating arteries are diminutive or absent. The right PCA branches are within normal limits. There is mild to moderate left PCA P2 segment irregularity and stenosis. There  is severe stenosis or focal occlusion in the distal left PCA just proximal to the P3 bifurcation best seen on series 12, image 19. There is distal left PCA enhancement. Anterior circulation: Both ICA siphons are patent. There is bilateral ICA siphon calcified plaque. There is subsequent mild multifocal right siphon stenosis. There is minimal to mild left siphon stenosis. Both ophthalmic artery origins are normal. Both carotid termini are patent. The MCA and ACA origins are patent. The anterior communicating artery is diminutive or absent. The bilateral PCA branches are normal. Right MCA M1 segment, bifurcation, and right MCA branches are within normal limits. Left MCA M1 segment, bifurcation, and left MCA branches are within normal limits. No left MCA branch occlusion is identified. Venous sinuses: Patent. Anatomic variants: Dominant left vertebral artery. Delayed phase: Stable gray-white matter differentiation throughout the brain. Small areas of cortical encephalomalacia along the posterior left sylvian fissure appear to be related to ischemia which was acute at the time of the 10/14/2015 MRI. No acute cortically based infarct identified. No abnormal enhancement identified. Review of the MIP images confirms the above findings IMPRESSION: 1. Negative for emergent large vessel occlusion. 2. Positive for hemodynamically significant bilateral ICA origin  atherosclerosis related to calcified plaque left (estimated at 70%) greater than right (60%). No significant intracranial anterior circulation stenosis despite bilateral ICA siphon calcified plaque. 3. Positive for intermittent occlusion of the non dominant right vertebral artery. There is reconstituted flow to the right V4 segment which functionally terminates in the right PICA. 4. Positive for severe distal left PCA stenosis (distal P2/P3 level). 5. Dominant left vertebral artery with only mild origin stenosis. 6. Stable CT appearance of the brain from earlier today. Electronically Signed   By: Genevie Ann M.D.   On: 12/06/2016 08:29   Ct Head Code Stroke W/o Cm  Result Date: 12/06/2016 CLINICAL DATA:  Code stroke.  Aphasia.  Unknown time last seen well. EXAM: CT HEAD WITHOUT CONTRAST TECHNIQUE: Contiguous axial images were obtained from the base of the skull through the vertex without intravenous contrast. COMPARISON:  CT head 10/13/2015.  MRI brain 10/14/2015. FINDINGS: Brain: Remote left parietal and basal ganglia infarcts are noted. Remote right parietal lobe infarcts are noted as well. No acute cortical infarct is present. There is no acute hemorrhage or mass lesion. The ventricles are proportionate to the degree of atrophy. No significant extra-axial fluid collection is present. The brainstem and cerebellum are normal. Vascular: Atherosclerotic calcifications are again seen. There is no hyperdense vessel. Skull: The calvarium is intact. Sinuses/Orbits: The paranasal sinuses are clear. There is minimal fluid in the left mastoid air cells. No obstructing nasopharyngeal lesion is present. Bilateral lens replacements are again noted. The globes and orbits are otherwise unremarkable. ASPECTS [facility] Stroke Program Early CT Score) - Ganglionic level infarction (caudate, lentiform nuclei, internal capsule, insula, M1-M3 cortex): 7/7 - Supraganglionic infarction (M4-M6 cortex): 3/3 Total score (0-10 with 10 being  normal): 10/10 IMPRESSION: 1. No acute intracranial abnormality. 2. Remote bilateral parietal lobe infarcts. 3. Remote left caudate head infarct. 4. Diffuse atrophy and white matter disease is similar to the prior exam. 5. Atherosclerosis without a hyperdense vessel. 6. No acute intracranial abnormality. 7. ASPECTS is 10/10 These results were called by telephone at the time of interpretation on 12/06/2016 at 6:57 am to Dr. Brantley Stage , who verbally acknowledged these results. Electronically Signed   By: San Morelle M.D.   On: 12/06/2016 06:58    Procedures Procedures (including critical care time)  Medications  Ordered in ED Medications  iopamidol (ISOVUE-370) 76 % injection 100 mL (75 mLs Intravenous Contrast Given 12/06/16 0747)     Initial Impression / Assessment and Plan / ED Course  I have reviewed the triage vital signs and the nursing notes.  Pertinent labs & imaging results that were available during my care of the patient were reviewed by me and considered in my medical decision making (see chart for details).  Clinical Course     Presenting with expressive aphasia. Activation of code stroke delayed because I had to personally get a hold of staff [facility] to obtain history of last normal and symptoms. Patient has no other focal deficits beside aphasia. CT head visualized and negative for hemorrhage. Discussed with neurologist via telemedicine. Did not recommend tpa as at 3 hour mark, unable to get a hold of family, and no other deficits besides aphasia. ? Some other toxic/metabolic etiology of symptoms as well.   CTA performed, visualized. No emergent large vessel occlusion. She does have ICA atherosclerosis. Plan to admit to hospitalist for MRI and stroke w/u.   Final Clinical Impressions(s) / ED Diagnoses   Final diagnoses:  Aphasia    New Prescriptions New Prescriptions   No medications on file     Forde Dandy, MD 12/06/16 [phone]

## 2016-12-07 LAB — LIPID PANEL
CHOLESTEROL: 96 mg/dL (ref 0–200)
HDL: 47 mg/dL (ref >40)
LDL CALC: 43 mg/dL (ref 0–99)
TRIGLYCERIDES: 31 mg/dL (ref <150)
Total CHOL/HDL Ratio: 2 RATIO
VLDL: 6 mg/dL (ref 0–40)

## 2016-12-07 MED ORDER — APIXABAN 2.5 MG PO TABS
2.5000 mg | ORAL_TABLET | Freq: Two times a day (BID) | ORAL | Status: DC
  Administered 2016-12-07 – 2016-12-08 (×2): 2.5 mg via ORAL
  Filled 2016-12-07 (×2): qty 1

## 2016-12-07 NOTE — Progress Notes (Signed)
Speech Language Pathology Treatment: Cognitive-Linquistic  Patient Details Name: Wendy Perkins MRN: [id number] DOB: 1919-03-26 Today's Date: 12/07/2016 Time: [id number] SLP Time Calculation (min) (ACUTE ONLY): 25 min  Assessment / Plan / Recommendation Clinical Impression  Pt seen at bedside with son present (different son from yesterday). She has more spontaneous speech today compared to yesterday. Pt seen for aphasia therapy and observed drinking thin liquids. Pt able to name 3/5 objects when allowed extra time. When asked how many children she had, she responded with "twelve" and laughed. She required visual and choral cues to verbalize "four". She was able to show me on her fingers. Pt able to name two animals (yesterday= none). She verbalized the months of the year and days of the week with mi/mod cues when given visual cue for initial phoneme. Pt showing good improvement in expressive language skills and will benefit from f/u SLP services at SNF.    HPI HPI: Wendy Perkins is a 82 y.o. female with h/o a fib not anticoagulated due to frequent falls and bleeding risk, HTN and HLD. She is unable to provide any history; history obtained by EDP and family at bedside. Apparently she woke up at 3:30 and requested assistance going to bathroom and appeared normal at that time. When she woke again at around 8 am she was having difficulty communicating and was sent to the ED for evaluation. MRI has subsequently confirmed an acute CVA and admission has been requested. She was not given TPA due to unknown time of onset and age.      SLP Plan  Continue with current plan of care (f/u at SNF)     Recommendations  Diet recommendations: Dysphagia 3 (mechanical soft);Thin liquid Liquids provided via: Cup;Straw Medication Administration: Whole meds with liquid Supervision: Intermittent supervision to cue for compensatory strategies Compensations: Slow rate Postural Changes and/or Swallow Maneuvers: Seated  upright 90 degrees;Upright 30-60 min after meal                Oral Care Recommendations: Oral care BID;Patient independent with oral care Follow up Recommendations: Skilled Nursing facility Plan: Continue with current plan of care (f/u at SNF)       Thank you,  Genene Churn, [place]                 [place] 12/07/2016, 7:28 PM

## 2016-12-07 NOTE — Care Management Important Message (Signed)
Important Message  Patient Details  Name: Wendy Perkins MRN: [id number] Date of Birth: 04-Mar-1919   Medicare Important Message Given:       Nickola Major, RN 12/07/2016, 4:07 PM

## 2016-12-07 NOTE — Clinical Social Work Note (Addendum)
Clinical Social Work Assessment  Patient Details  Name: Wendy Perkins MRN: [id number] Date of Birth: 06/19/1919  Date of referral:  12/07/16               Reason for consult:  Discharge Planning                Permission sought to share information with:    Permission granted to share information::     Name::        Agency::     Relationship::     Contact Information:     Housing/Transportation Living arrangements for the past 2 months:  [place] of Information:  Adult Children Patient Interpreter Needed:  None Criminal Activity/Legal Involvement Pertinent to Current Situation/Hospitalization:  No - Comment as needed Significant Relationships:  Adult Children Lives with:  Facility Resident Do you feel safe going back to the place where you live?  Yes Need for family participation in patient care:  Yes (Comment)  Care giving concerns:  None reported. Pt is resident at [facility].    Social Worker assessment / plan:  CSW attempted to meet with pt, but she was working with PT. Completed assessment with pt's son, Clance Boll who reports pt has been a resident at [facility] for several months. Family appears to be involved and supportive. Per Marianna Fuss at facility, pt is nursing level of care and will be long term. She uses a wheelchair primarily, but can ambulate short distances with assist. Admitted due to CVA. Pt is okay to return to [facility].   Update: CSW notified [facility] of possible weekend admission.   Employment status:  Retired Nurse, adult PT Recommendations:  [place] / Referral to community resources:  Other (Comment Required) (Return to [facility])  Patient/Family's Response to care:  Pt's son requests return to [facility] when medically stable.   Patient/Family's Understanding of and Emotional Response to Diagnosis, Current Treatment, and Prognosis:  Pt's son is aware of admission diagnosis and treatment plan. Possible d/c today.    Emotional Assessment Appearance:  Appears stated age Attitude/Demeanor/Rapport:  Unable to Assess Affect (typically observed):  Unable to Assess Orientation:  Oriented to Self, Oriented to Place, Oriented to Situation Alcohol / Substance use:  Not Applicable Psych involvement (Current and /or in the community):  No (Comment)  Discharge Needs  Concerns to be addressed:  Discharge Planning Concerns Readmission within the last 30 days:  No Current discharge risk:  None Barriers to Discharge:  No Barriers Identified   [name], [place] 12/07/2016, 10:29 AM [phone]

## 2016-12-07 NOTE — Consult Note (Addendum)
[place] A. Merlene Laughter, MD     [email or web address]          CORIE ALLIS is an 82 y.o. female.   ASSESSMENT/PLAN: 1. The patient has acute left MCA ischemic stroke. She has two potential etiology for her stroke. She does have atrial fibrillation. However, imaging shows high-grade stenosis involving the left extracranial ICA. I did have extensive discussion with the patient's son. He is locked in to consider surgery at this time. The procedure possibly could be done under local anesthesia but is always a possibility that this would have to be converted urgently to general anesthesia with the attending high risk of general anesthesia. The issue of the patient's gait and multiple falls in the past may have been mostly resolved now that she is in a nursing home facility. I will therefore recommend that she be placed back on Eliquis. I am also recommend aspirin for 1 month as the carotid stenosis is a platelet phenomenon. She is to continue with high-dose statin.      The patient a 82 year old white female who presents with the acute onset of speaking impairment/aphasia. Imaging shows a left MCA infarct. The patient presented with the left MCA disease a year ago. She was diagnosed as having atrial fibrillation and was placed anticoagulation. Unfortunately, she's had several falls over the past year and the decision was made to discontinue anticoagulation. She has been off Eliquis for about 2 months. She has been in the nursing home facility for the last few months. The son reports that since she has been there she has not had any additional falls. She mostly falls because of severe pain and compensation from arthritis. There report that her difficulty speaking has improved since she has been hospitalized. She does not report other symptoms at this time. She continues to complain of severe pain involving the right knee with the slightest of movements and there certainly with the  walking. She has seen an orthopedist and has had bilateral total knee arthroplasties. He reports that the only thing that could fix her problem would be another replacement was not indicated given her high risk for general anesthesia. The review systems otherwise negative.     GENERAL:  pleasant and in no acute distress.   HEENT:  normal   ABDOMEN: soft  EXTREMITIES: Severe arthritic changes of the knees bilaterally especially right side. The right knee is quite swollen.   BACK: Normal   SKIN: Normal by inspection.    MENTAL STATUS: she is awake and alert. She has a mild to moderate aphasia mostly expressive. She does comprehend fairly well. She is able to name for the 5 objects fairly well and did well with the other object but did have some paraphasic errors.  Neglect is not appreciated.   CRANIAL NERVES: Pupils are equal, round and reactive to light and accomodation; extra ocular movements are full, there is no significant nystagmus; visual fields are full; upper and lower facial muscles are normal in strength and symmetric, there is no flattening of the nasolabial folds; tongue is midline; uvula is midline; shoulder elevation is normal.  MOTOR: Normal tone, bulk and strength in the arms; no pronator drift. The left lower extremity is 4/5 in strength. The right lower extremities 3/5 although this seems to be mostly limited to severe pain. She has a mild drift left leg and a marked drift right lower extremity.  COORDINATION: Left finger to nose is normal, right finger to nose is normal, No  rest tremor; no intention tremor; no postural tremor; no bradykinesia.  REFLEXES: Deep tendon reflexes are symmetrical and normal but diminished in the legs.   SENSATION: She responds to painful stimuli bilaterally.        [[[[[[[[[[[[[[NEURO CONSULT 70- 2016 cute encephalopathy mostly due to acute embolic stroke involving the left hemisphere in the setting of chronic atrial fibrillation. The  patient also has a bladder infection. Atrial fibrillation. Hypertension.  RECOMMENDATION: The patient should be placed on anticoagulation long-term. Elliquis will be started. Follow-up echocardiography and carotid duplex Doppler. Physical occupational therapy and speech.  The patient is a 82 year old white female who is highly functional at baseline. She is by herself and does all her ADLs. The patient was found on the floor confused and disoriented. She apparently fell. She tells me that she fell once and got up trying to walk around fell again. She cannot get up on the second time. Patient may have had some dizziness. She denies any focal numbness or weakness but she does report her legs is felt as if they could not hold her up. The patient denies chest pain or shortness of breath. She does report having some language difficulties and difficulties with speech. Her symptoms have improved since she has been hospitalized. The patient has chronic atrial fibrillation and per her cardiologist know, she apparently refused anticoagulation. The exact reasons aren't known but could be due to inconvenience of warfarin therapy. The review of systems is otherwise unremarkable.]]]]]]]]]]]]]]]      Blood pressure 97/64, pulse 74, temperature 98.2 F (36.8 C), temperature source Oral, resp. rate 18, height _0  (1.6 m), weight 157 lb (71.2 kg), SpO2 98 %.  Past Medical History:  Diagnosis Date  . Atrial fibrillation ([place])   . Breast nodule 11/15/2014  . Breast pain, left 11/15/2014  . Carotid artery disease ([place])   . Dizziness   . Dyspnea    Previous CPX suggesting possible restrictive physiology, respiratory muscle fatigue, diastolic dysfunction  . Essential hypertension, benign   . Hyperlipidemia   . Oxygen dependent    2 liter  . Rib pain on left side 11/15/2014  . Seasonal allergies   . Shingles 05/04/2015  . Toe fracture, left    left big toe    Past Surgical History:  Procedure  Laterality Date  . COLONOSCOPY  02/22/2012   Procedure: COLONOSCOPY;  Surgeon: Rogene Houston, MD;  Location: [facility];  Service: Endoscopy;  Laterality: N/A;  100  . KNEE SURGERY     bilateral    Family History  Problem Relation Age of Onset  . Stroke Mother   . Pneumonia Father   . Heart disease Father   . Healthy Son   . Glaucoma Daughter   . Emphysema Daughter   . Healthy Daughter   . Other Daughter     had knee replacement  . Healthy Son   . Heart disease Maternal Grandmother     Social History:  reports that she has never smoked. She has never used smokeless tobacco. She reports that she does not drink alcohol or use drugs.  Allergies:  Allergies  Allergen Reactions  . Sulfur Swelling    Rash also per patient    Medications: Prior to Admission medications   Medication Sig Start Date End Date Taking? Authorizing Provider  acetaminophen (TYLENOL) 650 MG CR tablet Take 1,300 mg by mouth 3 (three) times daily.    Yes Historical Provider, MD  ANORO ELLIPTA 62.5-25 MCG/INH AEPB Inhale  1 Inhaler into the lungs daily.  10/12/14  Yes Historical Provider, MD  aspirin 81 MG tablet Take 81 mg by mouth daily.   Yes Historical Provider, MD  atorvastatin (LIPITOR) 80 MG tablet Take 1 tablet (80 mg total) by mouth daily at 6 PM. 10/17/15  Yes Sinda Du, MD  cycloSPORINE (RESTASIS) 0.05 % ophthalmic emulsion Place 1 drop into both eyes every 12 (twelve) hours.    Yes Historical Provider, MD  diltiazem (CARDIZEM CD) 180 MG 24 hr capsule TAKE ONE CAPSULE BY MOUTH ONCE DAILY. 05/10/15  Yes Satira Sark, MD  fluticasone (FLONASE) 50 MCG/ACT nasal spray Place 2 sprays into the nose daily as needed for allergies. Reported on 06/27/2016   Yes Historical Provider, MD  furosemide (LASIX) 20 MG tablet TAKE 1 TABLET BY MOUTH DAILY. 04/25/15  Yes Satira Sark, MD  Multiple Vitamin (MULITIVITAMIN WITH MINERALS) TABS Take 1 tablet by mouth every other day. Takes 1 tab 2-3 times a week    Yes Historical Provider, MD  omeprazole (PRILOSEC) 20 MG capsule Take 20 mg by mouth daily.     Yes Historical Provider, MD  ondansetron (ZOFRAN) 4 MG tablet Take 4 mg by mouth every 6 (six) hours as needed for nausea or vomiting.   Yes Historical Provider, MD  OXYGEN Inhale 2 L into the lungs daily. Oxygen @@ 2 L/mn via qhs. Twice a day    Yes Historical Provider, MD  Polyethyl Glycol-Propyl Glycol (SYSTANE OP) Apply 1 drop to eye 3 (three) times daily. For dry eyes   Yes Historical Provider, MD  potassium chloride SA (K-DUR,KLOR-CON) 20 MEQ tablet TAKE ONE TABLET BY MOUTH EVERY OTHER DAY. TAKE ONE THE DAY YOU TAKE FUROSEMIDE. Patient taking differently: TAKE ONE TABLET BY MOUTH EVERY  DAY. 06/14/15  Yes Lendon Colonel, NP  PROAIR HFA 108 (90 BASE) MCG/ACT inhaler 2 puffs 3 (three) times daily as needed for wheezing (cough).  05/13/15  Yes Historical Provider, MD  PROCTO-MED HC 2.5 % rectal cream Place 1 application rectally daily as needed for hemorrhoids.  07/09/16  Yes Historical Provider, MD  traMADol (ULTRAM) 50 MG tablet Take 50 mg by mouth every 8 (eight) hours as needed for moderate pain.    Yes Historical Provider, MD    Scheduled Meds: . aspirin  300 mg Rectal Daily   Or  . aspirin  325 mg Oral Daily  . enoxaparin (LOVENOX) injection  40 mg Subcutaneous Q24H   Continuous Infusions: . sodium chloride 75 mL/hr at 12/07/16 1629   PRN Meds:.acetaminophen **OR** acetaminophen (TYLENOL) oral liquid 160 mg/5 mL **OR** acetaminophen, diclofenac sodium, senna-docusate, traMADol     Results for orders placed or performed during the hospital encounter of 12/06/16 (from the past 48 hour(s))  Protime-INR     Status: None   Collection Time: 12/06/16  6:15 AM  Result Value Ref Range   Prothrombin Time 13.5 11.4 - 15.2 seconds   INR 1.03   APTT     Status: None   Collection Time: 12/06/16  6:15 AM  Result Value Ref Range   aPTT 28 24 - 36 seconds  CBC     Status: None   Collection  Time: 12/06/16  6:15 AM  Result Value Ref Range   WBC 9.7 4.0 - 10.5 K/uL   RBC 4.98 3.87 - 5.11 MIL/uL   Hemoglobin 14.2 12.0 - 15.0 g/dL   HCT 43.4 36.0 - 46.0 %   MCV 87.1 78.0 - 100.0 fL  MCH 28.5 26.0 - 34.0 pg   MCHC 32.7 30.0 - 36.0 g/dL   RDW 14.9 11.5 - 15.5 %   Platelets 228 150 - 400 K/uL  Differential     Status: None   Collection Time: 12/06/16  6:15 AM  Result Value Ref Range   Neutrophils Relative % 62 %   Neutro Abs 6.0 1.7 - 7.7 K/uL   Lymphocytes Relative 28 %   Lymphs Abs 2.7 0.7 - 4.0 K/uL   Monocytes Relative 9 %   Monocytes Absolute 0.8 0.1 - 1.0 K/uL   Eosinophils Relative 1 %   Eosinophils Absolute 0.1 0.0 - 0.7 K/uL   Basophils Relative 0 %   Basophils Absolute 0.0 0.0 - 0.1 K/uL  Comprehensive metabolic panel     Status: None   Collection Time: 12/06/16  6:15 AM  Result Value Ref Range   Sodium 138 135 - 145 mmol/L   Potassium 3.8 3.5 - 5.1 mmol/L   Chloride 106 101 - 111 mmol/L   CO2 22 22 - 32 mmol/L   Glucose, Bld 98 65 - 99 mg/dL   BUN 17 6 - 20 mg/dL   Creatinine, Ser 0.71 0.44 - 1.00 mg/dL   Calcium 9.3 8.9 - 10.3 mg/dL   Total Protein 6.8 6.5 - 8.1 g/dL   Albumin 3.8 3.5 - 5.0 g/dL   AST 23 15 - 41 U/L   ALT 16 14 - 54 U/L   Alkaline Phosphatase 99 38 - 126 U/L   Total Bilirubin 1.2 0.3 - 1.2 mg/dL   GFR calc non Af Amer >60 >60 mL/min   GFR calc Af Amer >60 >60 mL/min    Comment: (NOTE) The eGFR has been calculated using the CKD EPI equation. This calculation has not been validated in all clinical situations. eGFR's persistently <60 mL/min signify possible Chronic Kidney Disease.    Anion gap 10 5 - 15  Urine rapid drug screen (hosp performed)not at [facility]     Status: None   Collection Time: 12/06/16  6:15 AM  Result Value Ref Range   Opiates NONE DETECTED NONE DETECTED   Cocaine NONE DETECTED NONE DETECTED   Benzodiazepines NONE DETECTED NONE DETECTED   Amphetamines NONE DETECTED NONE DETECTED   Tetrahydrocannabinol NONE  DETECTED NONE DETECTED   Barbiturates NONE DETECTED NONE DETECTED    Comment:        DRUG SCREEN FOR MEDICAL PURPOSES ONLY.  IF CONFIRMATION IS NEEDED FOR ANY PURPOSE, NOTIFY LAB WITHIN 5 DAYS.        LOWEST DETECTABLE LIMITS FOR URINE DRUG SCREEN Drug Class       Cutoff (ng/mL) Amphetamine      1000 Barbiturate      200 Benzodiazepine   616 Tricyclics       073 Opiates          300 Cocaine          300 THC              50   Urinalysis, Routine w reflex microscopic     Status: Abnormal   Collection Time: 12/06/16  6:15 AM  Result Value Ref Range   Color, Urine YELLOW YELLOW   APPearance CLEAR CLEAR   Specific Gravity, Urine 1.010 1.005 - 1.030   pH 7.0 5.0 - 8.0   Glucose, UA NEGATIVE NEGATIVE mg/dL   Hgb urine dipstick NEGATIVE NEGATIVE   Bilirubin Urine NEGATIVE NEGATIVE   Ketones, ur NEGATIVE NEGATIVE mg/dL   Protein,  ur NEGATIVE NEGATIVE mg/dL   Nitrite NEGATIVE NEGATIVE   Leukocytes, UA SMALL (A) NEGATIVE  Urinalysis, Microscopic (reflex)     Status: Abnormal   Collection Time: 12/06/16  6:15 AM  Result Value Ref Range   RBC / HPF NONE SEEN 0 - 5 RBC/hpf   WBC, UA 0-5 0 - 5 WBC/hpf   Bacteria, UA NONE SEEN NONE SEEN   Squamous Epithelial / LPF 0-5 (A) NONE SEEN  Brain natriuretic peptide     Status: Abnormal   Collection Time: 12/06/16  6:15 AM  Result Value Ref Range   B Natriuretic Peptide 215.0 (H) 0.0 - 100.0 pg/mL  I-stat troponin, ED (not at [facility], [facility])     Status: None   Collection Time: 12/06/16  6:36 AM  Result Value Ref Range   Troponin i, poc 0.01 0.00 - 0.08 ng/mL   Comment 3            Comment: Due to the release kinetics of cTnI, a negative result within the first hours of the onset of symptoms does not rule out myocardial infarction with certainty. If myocardial infarction is still suspected, repeat the test at appropriate intervals.   I-Stat Chem 8, ED  (not at [facility], [facility])     Status: Abnormal   Collection Time: 12/06/16  6:38 AM  Result  Value Ref Range   Sodium 141 135 - 145 mmol/L   Potassium 3.8 3.5 - 5.1 mmol/L   Chloride 107 101 - 111 mmol/L   BUN 16 6 - 20 mg/dL   Creatinine, Ser 0.70 0.44 - 1.00 mg/dL   Glucose, Bld 93 65 - 99 mg/dL   Calcium, Ion 1.14 (L) 1.15 - 1.40 mmol/L   TCO2 21 0 - 100 mmol/L   Hemoglobin 14.6 12.0 - 15.0 g/dL   HCT 43.0 36.0 - 46.0 %  MRSA PCR Screening     Status: None   Collection Time: 12/06/16 12:36 PM  Result Value Ref Range   MRSA by PCR NEGATIVE NEGATIVE    Comment:        The GeneXpert MRSA Assay (FDA approved for NASAL specimens only), is one component of a comprehensive MRSA colonization surveillance program. It is not intended to diagnose MRSA infection nor to guide or monitor treatment for MRSA infections.   Lipid panel     Status: None   Collection Time: 12/07/16  4:21 AM  Result Value Ref Range   Cholesterol 96 0 - 200 mg/dL   Triglycerides 31 <150 mg/dL   HDL 47 >40 mg/dL   Total CHOL/HDL Ratio 2.0 RATIO   VLDL 6 0 - 40 mg/dL   LDL Cholesterol 43 0 - 99 mg/dL    Comment:        Total Cholesterol/HDL:CHD Risk Coronary Heart Disease Risk Table                     Men   Women  1/2 Average Risk   3.4   3.3  Average Risk       5.0   4.4  2 X Average Risk   9.6   7.1  3 X Average Risk  23.4   11.0        Use the calculated Patient Ratio above and the CHD Risk Table to determine the patient's CHD Risk.        ATP III CLASSIFICATION (LDL):  <100     mg/dL   Optimal  100-129  mg/dL  Near or Above                    Optimal  130-159  mg/dL   Borderline  160-189  mg/dL   High  >190     mg/dL   Very High     Studies/Results:  BRAIN MRI FINDINGS: The examination had to be discontinued prior to completion due to patient agitation, lack of cooperation. Diffusion, sagittal T1 weighted and axial T2 weighted imaging only was obtained.  Brain: Axial in coronal diffusion weighted imaging is obtained demonstrating several small areas (up to 12 mm) of  restricted diffusion in the left MCA territory affecting the left operculum and white matter of the left centrum semiovale (series 3 images 87 and 96.  No contralateral or posterior fossa restricted diffusion. No mass effect. No evidence of associated hemorrhage. No ventriculomegaly.  Stable gray and white matter signal otherwise.  Vascular: Major intracranial vascular flow voids are stable since 2016.  Skull and upper cervical spine: Chronic degenerative changes at the skullbase and upper cervical spine have mildly progressed since 2016.  Sinuses/Orbits: Visualized paranasal sinuses and mastoids are stable and well pneumatized. Stable orbit and scalp soft tissues.  IMPRESSION: 1. Several small acute infarcts in the left MCA territory affecting the operculum and left centrum semiovale. No evidence of associated hemorrhage or mass effect. 2. No other acute intracranial abnormality identified. 3.  The examination had to be discontinued prior to completion.       The brain MRI is reviewed in person. There are a few small cortical based infarct involving the left temporal regions and also left frontal area. There is moderate global atrophy. There is moderate confluent leukoencephalopathy from chronic microcytic vascular changes.      HEAD NECK CTA FINDINGS: CTA NECK  Skeleton: Mild progression of degenerative cervical spine spondylolisthesis since 2009. 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 Upper thoracic spine mild scoliosis, and endplate degeneration. No acute osseous abnormality identified.  Visualized paranasal sinuses and mastoids are stable and well pneumatized.  Upper chest: Negative.  Other neck: Thyroid, larynx, pharynx, parapharyngeal spaces, retropharyngeal space, sublingual space, submandibular glands and parotid glands are within normal limits. No  cervical lymphadenopathy.  Aortic arch: 3 vessel arch configuration with moderate mostly calcified arch and proximal great vessel atherosclerosis.  Right carotid system: No brachiocephalic artery or right CCA origin stenosis despite atherosclerosis. Tortuous proximal right CCA with a kinked appearance at the thoracic inlet abutting the tracheoesophageal groove (series 13, image 30). No other right CCA stenosis proximal to the bifurcation. Soft and calcified plaque at the right carotid bifurcation affecting the right ICA origin and bulb. Stenosis up to 60 % with respect to the distal vessel occurs. Otherwise negative cervical right ICA.  Left carotid system: No left CCA origin stenosis despite calcified plaque. Mildly tortuous proximal left CCA. Anterior left CCA calcified plaque at the level of the hypopharynx. At the left carotid bifurcation there is bulky calcified plaque resulting in high-grade stenosis of the proximal left ICA, numerically estimated at 70. The left ICA remains patent and distal to the bulb the cervical left ICA is negative. % with respect to the distal vessel  Vertebral arteries:  No proximal right subclavian artery stenosis despite soft and calcified plaque. The right vertebral artery is non dominant. The right vertebral artery origin is occluded along with soft and calcified plaque, but there is some reconstituted flow in the  right V1 segment, and intermittently in the right V2 segment, although the vessel appears mostly occluded between the C1 and C4 levels. There is reconstituted flow again in the right V3 segment such that the vessel appears patent at the skullbase.  No proximal left subclavian artery hemodynamically significant stenosis despite calcified and bulky soft atherosclerotic plaque (series 9, image 275). The left vertebral artery is dominant. The left vertebral artery origin is patent with mild stenosis. The left V1 segment is tortuous.  The left V2 segment is tortuous. There is no other left vertebral artery stenosis to the skullbase.  CTA HEAD  Posterior circulation: Dominant left vertebral artery primarily supplies the basilar with minimal V4 segment calcified plaque and no stenosis. Patent left PICA origin. The non dominant distal right vertebral artery is irregular and functionally terminates in the right PICA 0 which is patent.  The basilar artery is patent with mild irregularity and no stenosis. The SCA and PCA origins are patent. Posterior communicating arteries are diminutive or absent. The right PCA branches are within normal limits. There is mild to moderate left PCA P2 segment irregularity and stenosis. There is severe stenosis or focal occlusion in the distal left PCA just proximal to the P3 bifurcation best seen on series 12, image 19. There is distal left PCA enhancement.  Anterior circulation: Both ICA siphons are patent. There is bilateral ICA siphon calcified plaque. There is subsequent mild multifocal right siphon stenosis. There is minimal to mild left siphon stenosis. Both ophthalmic artery origins are normal. Both carotid termini are patent. The MCA and ACA origins are patent. The anterior communicating artery is diminutive or absent. The bilateral PCA branches are normal. Right MCA M1 segment, bifurcation, and right MCA branches are within normal limits. Left MCA M1 segment, bifurcation, and left MCA branches are within normal limits. No left MCA branch occlusion is identified.  Venous sinuses: Patent.  Anatomic variants: Dominant left vertebral artery.  Delayed phase: Stable gray-white matter differentiation throughout the brain. Small areas of cortical encephalomalacia along the posterior left sylvian fissure appear to be related to ischemia which was acute at the time of the 10/14/2015 MRI. No acute cortically based infarct identified. No abnormal  enhancement identified.  Review of the MIP images confirms the above findings  IMPRESSION: 1. Negative for emergent large vessel occlusion. 2. Positive for hemodynamically significant bilateral ICA origin atherosclerosis related to calcified plaque left (estimated at 70%) greater than right (60%). No significant intracranial anterior circulation stenosis despite bilateral ICA siphon calcified plaque. 3. Positive for intermittent occlusion of the non dominant right vertebral artery. There is reconstituted flow to the right V4 segment which functionally terminates in the right PICA. 4. Positive for severe distal left PCA stenosis (distal P2/P3 level). 5. Dominant left vertebral artery with only mild origin stenosis. 6. Stable CT appearance of the brain from earlier today.                           BRAIN MRI 2016 BRAIN MRI 1. Acute nonhemorrhagic infarct involving the left caudate. 2. Multiple scattered cortical acute nonhemorrhagic infarcts within the left MCA territory. These involve the left frontal operculum and left parietal lobe. 3. Remote cortical infarcts of the right parietal lobe. 4. Extensive atrophy and white matter disease compatible with a history of chronic microvascular ischemia. 5. Occlusion of the right vertebral artery.        Jatinder Mcdonagh A. Merlene Laughter, M.D.  Diplomate, Tax adviser of Psychiatry  and Neurology ( Neurology). 12/07/2016, 5:39 PM

## 2016-12-07 NOTE — Evaluation (Signed)
Occupational Therapy Evaluation Patient Details Name: Wendy Perkins MRN: [id number] DOB: January 19, 1919 Today's Date: 12/07/2016    History of Present Illness Wendy Perkins is a 82 y.o. female with h/o a fib not anticoagulated due to frequent falls and bleeding risk, HTN and HLD. She is unable to provide any history; history obtained by EDP and family at bedside. Apparently she woke up at 3:30 and requested assistance going to bathroom and appeared normal at that time. When she woke again at around 8 am she was having difficulty communicating and was sent to the ED for evaluation. MRI has subsequently confirmed an acute CVA    Clinical Impression   Pt awake, alert, agreeable to OT evaluation. Pt wearing hearing aid in left ear, responds better if speaking near left ear. Pt able to respond to simple yes/no questions, is attempting to communicate verbally this am. Pt able to follow simple 1-2 step commands consistently. Demonstrates some right inattention during tasks, notably with hand hygiene and feeding tasks. Responds well with verbal and visual cuing for items placed to right. Pt confirms staff at [facility] assist with all ADL tasks, uses walker and wheelchair at [facility]. Pt requiring mod assist for transfers using RW due to weakness of RLE, max assist for ADL tasks this am. Recommend returning to SNF [facility]) with rehab services to improve safety and independence in ADL completion and functional mobility/transfer tasks.     Follow Up Recommendations  SNF (return to [facility] with rehab services)    Equipment Recommendations  None recommended by OT       Precautions / Restrictions Precautions Precautions: Fall Restrictions Weight Bearing Restrictions: No      Mobility Bed Mobility Overal bed mobility: Modified Independent                Transfers Overall transfer level: Needs assistance Equipment used: Rolling walker (2 wheeled) Transfers: Sit to/from [facility] Sit to  Stand: Mod assist (verbal cuing for pushing up from bed) Stand pivot transfers: Mod assist                 ADL Overall ADL's : Needs assistance/impaired Eating/Feeding: Set up;Sitting   Grooming: Wash/dry hands;Set up;Sitting (hand sanitizer) Grooming Details (indicate cue type and reason): Pt required visual cuing to recognize hand sanitizer in right hand             Lower Body Dressing: Maximal assistance;Sitting/lateral leans Lower Body Dressing Details (indicate cue type and reason): Pt confirms staff assists with LB dressing at baseline Toilet Transfer: Moderate assistance;Stand-pivot;BSC;RW Toilet Transfer Details (indicate cue type and reason): Pt requires mod assist for toilet transfer, putting approximately 50% weight on RLE  Toileting- Clothing Manipulation and Hygiene: Maximal assistance;Sit to/from stand Toileting - Clothing Manipulation Details (indicate cue type and reason): Pt able to stand and hold walker with +1 assist while nurse tech performed peri-hygiene             Vision Vision Assessment?: No apparent visual deficits          Pertinent Vitals/Pain Pain Assessment: No/denies pain     Hand Dominance Right   Extremity/Trunk Assessment Upper Extremity Assessment Upper Extremity Assessment: Generalized weakness (hx of right shoulder injury)   Lower Extremity Assessment Lower Extremity Assessment: Defer to PT evaluation       Communication Communication Communication: Expressive difficulties;HOH   Cognition Arousal/Alertness: Awake/alert Behavior During Therapy: WFL for tasks assessed/performed Overall Cognitive Status: Within Functional Limits for tasks assessed  Home Living Family/patient expects to be discharged to:: Skilled nursing facility [facility])                                        Prior Functioning/Environment Level of Independence: Needs assistance  Gait /  Transfers Assistance Needed: Pt reports she uses wheelchair and walker at [facility] ADL's / Homemaking Assistance Needed: [facility] staff assist with all ADL tasks-dressing, bathing, grooming, meal prep            OT Problem List: Decreased strength;Decreased activity tolerance;Impaired balance (sitting and/or standing)    End of Session Equipment Utilized During Treatment: Gait belt;Rolling walker  Activity Tolerance: Patient tolerated treatment well Patient left: in chair;with call bell/phone within reach   Time: 0812-0837 OT Time Calculation (min): 25 min Charges:  OT General Charges $OT Visit: 1 Procedure OT Evaluation $OT Eval Low Complexity: 1 Procedure Guadelupe Sabin, OTR/L  [phone] 12/07/2016, 8:47 AM

## 2016-12-07 NOTE — NC FL2 (Signed)
[place] LEVEL OF CARE SCREENING TOOL     IDENTIFICATION  Patient Name: Wendy Perkins Birthdate: 1919-03-21 Sex: female Admission Date (Current Location): 12/06/2016  [facility] and [place] Number:  [employer] and Address:  [place] [address] [place]      Provider Number: [id number]  Attending Physician Name and Address:  Koleen Nimrod Acost*  Relative Name and Phone Number:       Current Level of Care: Hospital Recommended Level of Care: [place] Prior Approval Number:    Date Approved/Denied:   PASRR Number: [id number]  Discharge Plan: SNF    Current Diagnoses: Patient Active Problem List   Diagnosis Date Noted  . Aphasia 12/06/2016  . Knee pain, acute 08/06/2016  . Fall 08/06/2016  . Acute CVA (cerebrovascular accident) ([place]) 10/17/2015  . COPD (chronic obstructive pulmonary disease) ([place]) 10/17/2015  . UTI (urinary tract infection) 10/17/2015  . Altered mental state 10/13/2015  . Elevated troponin 10/13/2015  . Shingles 05/04/2015  . Rib pain on left side 11/15/2014  . Breast pain, left 11/15/2014  . Breast nodule 11/15/2014  . Orthostatic hypotension 09/30/2013  . Atrial fibrillation ([place]) 11/23/2011  . Chronic diastolic heart failure ([place]) 11/23/2011  . Essential hypertension, benign 07/13/2010  . DYSPNEA 11/15/2009    Orientation RESPIRATION BLADDER Height & Weight     Self, Situation, Place  O2 (2 L) Continent Weight: 157 lb (71.2 kg) Height:  5\' 3"  (160 cm)  BEHAVIORAL SYMPTOMS/MOOD NEUROLOGICAL BOWEL NUTRITION STATUS  Other (Comment) (n/a)  (n/a) Continent Diet (Dysphagia 3)  AMBULATORY STATUS COMMUNICATION OF NEEDS Skin   Extensive Assist Verbally Bruising                       Personal Care Assistance Level of Assistance  Bathing, Feeding, Dressing Bathing Assistance: Maximum assistance Feeding assistance: Limited assistance Dressing Assistance: Maximum  assistance     Functional Limitations Info  Sight, Hearing, Speech Sight Info: Impaired Hearing Info: Impaired Speech Info: Adequate    SPECIAL CARE FACTORS FREQUENCY                       Contractures      Additional Factors Info  Code Status, Allergies Code Status Info: DNR Allergies Info: Sulfur           Current Medications (12/07/2016):  This is the current hospital active medication list Current Facility-Administered Medications  Medication Dose Route Frequency Provider Last Rate Last Dose  . 0.9 %  sodium chloride infusion   Intravenous Continuous Erline Hau, MD 75 mL/hr at 12/07/16 0316    . acetaminophen (TYLENOL) tablet 650 mg  650 mg Oral Q4H PRN Erline Hau, MD   650 mg at 12/06/16 2033   Or  . acetaminophen (TYLENOL) solution 650 mg  650 mg Per Tube Q4H PRN Erline Hau, MD       Or  . acetaminophen (TYLENOL) suppository 650 mg  650 mg Rectal Q4H PRN Erline Hau, MD      . aspirin suppository 300 mg  300 mg Rectal Daily Erline Hau, MD       Or  . aspirin tablet 325 mg  325 mg Oral Daily Erline Hau, MD   325 mg at 12/06/16 1459  . diclofenac sodium (VOLTAREN) 1 % transdermal gel 2 g  2 g Topical QID  PRN Erline Hau, MD   2 g at 12/06/16 1736  . enoxaparin (LOVENOX) injection 40 mg  40 mg Subcutaneous Q24H Erline Hau, MD   40 mg at 12/06/16 2234  . senna-docusate (Senokot-S) tablet 1 tablet  1 tablet Oral QHS PRN Erline Hau, MD      . traMADol Veatrice Bourbon) tablet 50 mg  50 mg Oral Q8H PRN Erline Hau, MD   50 mg at 12/06/16 1633     Discharge Medications: Please see discharge summary for a list of discharge medications.  Relevant Imaging Results:  Relevant Lab Results:   Additional Information SSN: [id number]  [name], [place]

## 2016-12-07 NOTE — Progress Notes (Signed)
Attending paged again  Celestia Khat, RN

## 2016-12-07 NOTE — Progress Notes (Signed)
Subjective: She is able to speak better than when I saw her briefly yesterday. PT evaluation is underway. Speech evaluation has been done. She has had another stroke. She will has not been on anticoagulation because of GI bleeding and multiple falls. She has chronic atrial fib. No new complaints.  Objective: Vital signs in last 24 hours: Temp:  [97.3 F (36.3 C)-98.7 F (37.1 C)] 98.5 F (36.9 C) (12/29 0728) Pulse Rate:  [56-102] 78 (12/29 0728) Resp:  [13-24] 18 (12/29 0728) BP: (136-197)/(54-153) 144/65 (12/29 0728) SpO2:  [95 %-100 %] 98 % (12/29 0728) Weight:  [71.2 kg (157 lb)] 71.2 kg (157 lb) (12/28 1337) Weight change: -0.285 kg (-10.1 oz) Last BM Date: 12/06/16  Intake/Output from previous day: 12/28 0701 - 12/29 0700 In: 1198.8 [P.O.:120; I.V.:1078.8] Out: -   PHYSICAL EXAM General appearance: alert, cooperative, no distress and Able to speak some and her speech is understandable Resp: clear to auscultation bilaterally Cardio: Heart is irregular. No edema GI: soft, non-tender; bowel sounds normal; no masses,  no organomegaly Extremities: extremities normal, atraumatic, no cyanosis or edema Skin warm and dry. Mucous membranes are moist.  Lab Results:  Results for orders placed or performed during the hospital encounter of 12/06/16 (from the past 48 hour(s))  Protime-INR     Status: None   Collection Time: 12/06/16  6:15 AM  Result Value Ref Range   Prothrombin Time 13.5 11.4 - 15.2 seconds   INR 1.03   APTT     Status: None   Collection Time: 12/06/16  6:15 AM  Result Value Ref Range   aPTT 28 24 - 36 seconds  CBC     Status: None   Collection Time: 12/06/16  6:15 AM  Result Value Ref Range   WBC 9.7 4.0 - 10.5 K/uL   RBC 4.98 3.87 - 5.11 MIL/uL   Hemoglobin 14.2 12.0 - 15.0 g/dL   HCT 43.4 36.0 - 46.0 %   MCV 87.1 78.0 - 100.0 fL   MCH 28.5 26.0 - 34.0 pg   MCHC 32.7 30.0 - 36.0 g/dL   RDW 14.9 11.5 - 15.5 %   Platelets 228 150 - 400 K/uL  Differential      Status: None   Collection Time: 12/06/16  6:15 AM  Result Value Ref Range   Neutrophils Relative % 62 %   Neutro Abs 6.0 1.7 - 7.7 K/uL   Lymphocytes Relative 28 %   Lymphs Abs 2.7 0.7 - 4.0 K/uL   Monocytes Relative 9 %   Monocytes Absolute 0.8 0.1 - 1.0 K/uL   Eosinophils Relative 1 %   Eosinophils Absolute 0.1 0.0 - 0.7 K/uL   Basophils Relative 0 %   Basophils Absolute 0.0 0.0 - 0.1 K/uL  Comprehensive metabolic panel     Status: None   Collection Time: 12/06/16  6:15 AM  Result Value Ref Range   Sodium 138 135 - 145 mmol/L   Potassium 3.8 3.5 - 5.1 mmol/L   Chloride 106 101 - 111 mmol/L   CO2 22 22 - 32 mmol/L   Glucose, Bld 98 65 - 99 mg/dL   BUN 17 6 - 20 mg/dL   Creatinine, Ser 0.71 0.44 - 1.00 mg/dL   Calcium 9.3 8.9 - 10.3 mg/dL   Total Protein 6.8 6.5 - 8.1 g/dL   Albumin 3.8 3.5 - 5.0 g/dL   AST 23 15 - 41 U/L   ALT 16 14 - 54 U/L   Alkaline Phosphatase 99 38 -  126 U/L   Total Bilirubin 1.2 0.3 - 1.2 mg/dL   GFR calc non Af Amer >60 >60 mL/min   GFR calc Af Amer >60 >60 mL/min    Comment: (NOTE) The eGFR has been calculated using the CKD EPI equation. This calculation has not been validated in all clinical situations. eGFR's persistently <60 mL/min signify possible Chronic Kidney Disease.    Anion gap 10 5 - 15  Urine rapid drug screen (hosp performed)not at [facility]     Status: None   Collection Time: 12/06/16  6:15 AM  Result Value Ref Range   Opiates NONE DETECTED NONE DETECTED   Cocaine NONE DETECTED NONE DETECTED   Benzodiazepines NONE DETECTED NONE DETECTED   Amphetamines NONE DETECTED NONE DETECTED   Tetrahydrocannabinol NONE DETECTED NONE DETECTED   Barbiturates NONE DETECTED NONE DETECTED    Comment:        DRUG SCREEN FOR MEDICAL PURPOSES ONLY.  IF CONFIRMATION IS NEEDED FOR ANY PURPOSE, NOTIFY LAB WITHIN 5 DAYS.        LOWEST DETECTABLE LIMITS FOR URINE DRUG SCREEN Drug Class       Cutoff (ng/mL) Amphetamine      1000 Barbiturate       200 Benzodiazepine   976 Tricyclics       734 Opiates          300 Cocaine          300 THC              50   Urinalysis, Routine w reflex microscopic     Status: Abnormal   Collection Time: 12/06/16  6:15 AM  Result Value Ref Range   Color, Urine YELLOW YELLOW   APPearance CLEAR CLEAR   Specific Gravity, Urine 1.010 1.005 - 1.030   pH 7.0 5.0 - 8.0   Glucose, UA NEGATIVE NEGATIVE mg/dL   Hgb urine dipstick NEGATIVE NEGATIVE   Bilirubin Urine NEGATIVE NEGATIVE   Ketones, ur NEGATIVE NEGATIVE mg/dL   Protein, ur NEGATIVE NEGATIVE mg/dL   Nitrite NEGATIVE NEGATIVE   Leukocytes, UA SMALL (A) NEGATIVE  Urinalysis, Microscopic (reflex)     Status: Abnormal   Collection Time: 12/06/16  6:15 AM  Result Value Ref Range   RBC / HPF NONE SEEN 0 - 5 RBC/hpf   WBC, UA 0-5 0 - 5 WBC/hpf   Bacteria, UA NONE SEEN NONE SEEN   Squamous Epithelial / LPF 0-5 (A) NONE SEEN  Brain natriuretic peptide     Status: Abnormal   Collection Time: 12/06/16  6:15 AM  Result Value Ref Range   B Natriuretic Peptide 215.0 (H) 0.0 - 100.0 pg/mL  I-stat troponin, ED (not at [facility], [facility]     Status: None   Collection Time: 12/06/16  6:36 AM  Result Value Ref Range   Troponin i, poc 0.01 0.00 - 0.08 ng/mL   Comment 3            Comment: Due to the release kinetics of cTnI, a negative result within the first hours of the onset of symptoms does not rule out myocardial infarction with certainty. If myocardial infarction is still suspected, repeat the test at appropriate intervals.   I-Stat Chem 8, ED  (not at [facility], [facility])     Status: Abnormal   Collection Time: 12/06/16  6:38 AM  Result Value Ref Range   Sodium 141 135 - 145 mmol/L   Potassium 3.8 3.5 - 5.1 mmol/L   Chloride 107 101 - 111  mmol/L   BUN 16 6 - 20 mg/dL   Creatinine, Ser 0.70 0.44 - 1.00 mg/dL   Glucose, Bld 93 65 - 99 mg/dL   Calcium, Ion 1.14 (L) 1.15 - 1.40 mmol/L   TCO2 21 0 - 100 mmol/L   Hemoglobin 14.6 12.0 - 15.0 g/dL   HCT 43.0  36.0 - 46.0 %  MRSA PCR Screening     Status: None   Collection Time: 12/06/16 12:36 PM  Result Value Ref Range   MRSA by PCR NEGATIVE NEGATIVE    Comment:        The GeneXpert MRSA Assay (FDA approved for NASAL specimens only), is one component of a comprehensive MRSA colonization surveillance program. It is not intended to diagnose MRSA infection nor to guide or monitor treatment for MRSA infections.   Lipid panel     Status: None   Collection Time: 12/07/16  4:21 AM  Result Value Ref Range   Cholesterol 96 0 - 200 mg/dL   Triglycerides 31 <150 mg/dL   HDL 47 >40 mg/dL   Total CHOL/HDL Ratio 2.0 RATIO   VLDL 6 0 - 40 mg/dL   LDL Cholesterol 43 0 - 99 mg/dL    Comment:        Total Cholesterol/HDL:CHD Risk Coronary Heart Disease Risk Table                     Men   Women  1/2 Average Risk   3.4   3.3  Average Risk       5.0   4.4  2 X Average Risk   9.6   7.1  3 X Average Risk  23.4   11.0        Use the calculated Patient Ratio above and the CHD Risk Table to determine the patient's CHD Risk.        ATP III CLASSIFICATION (LDL):  <100     mg/dL   Optimal  100-129  mg/dL   Near or Above                    Optimal  130-159  mg/dL   Borderline  160-189  mg/dL   High  >190     mg/dL   Very High     ABGS  Recent Labs  12/06/16 0638  TCO2 21   CULTURES Recent Results (from the past 240 hour(s))  MRSA PCR Screening     Status: None   Collection Time: 12/06/16 12:36 PM  Result Value Ref Range Status   MRSA by PCR NEGATIVE NEGATIVE Final    Comment:        The GeneXpert MRSA Assay (FDA approved for NASAL specimens only), is one component of a comprehensive MRSA colonization surveillance program. It is not intended to diagnose MRSA infection nor to guide or monitor treatment for MRSA infections.    Studies/Results: Ct Angio Head W Or Wo Contrast  Result Date: 12/06/2016 CLINICAL DATA:  82 year old female code stroke, aphasia. Initial encounter.  EXAM: CT ANGIOGRAPHY HEAD AND NECK TECHNIQUE: Multidetector CT imaging of the head and neck was performed using the standard protocol during bolus administration of intravenous contrast. Multiplanar CT image reconstructions and MIPs were obtained to evaluate the vascular anatomy. Carotid stenosis measurements (when applicable) are obtained utilizing NASCET criteria, using the distal internal carotid diameter as the denominator. CONTRAST:  75 mL Isovue 370 COMPARISON:  Head CT without contrast 0641 hours today. Brain MRI 10/14/2015. Cervical spine  CT 11/08/2008. FINDINGS: CTA NECK Skeleton: Mild progression of degenerative cervical spine spondylolisthesis since 2009. 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 Upper thoracic spine mild scoliosis, and endplate degeneration. No acute osseous abnormality identified. Visualized paranasal sinuses and mastoids are stable and well pneumatized. Upper chest: Negative. Other neck: Thyroid, larynx, pharynx, parapharyngeal spaces, retropharyngeal space, sublingual space, submandibular glands and parotid glands are within normal limits. No cervical lymphadenopathy. Aortic arch: 3 vessel arch configuration with moderate mostly calcified arch and proximal great vessel atherosclerosis. Right carotid system: No brachiocephalic artery or right CCA origin stenosis despite atherosclerosis. Tortuous proximal right CCA with a kinked appearance at the thoracic inlet abutting the tracheoesophageal groove (series 13, image 30). No other right CCA stenosis proximal to the bifurcation. Soft and calcified plaque at the right carotid bifurcation affecting the right ICA origin and bulb. Stenosis up to 60 % with respect to the distal vessel occurs. Otherwise negative cervical right ICA. Left carotid system: No left CCA origin stenosis despite calcified plaque. Mildly tortuous proximal left CCA. Anterior left CCA calcified plaque at  the level of the hypopharynx. At the left carotid bifurcation there is bulky calcified plaque resulting in high-grade stenosis of the proximal left ICA, numerically estimated at 70. The left ICA remains patent and distal to the bulb the cervical left ICA is negative. % with respect to the distal vessel Vertebral arteries: No proximal right subclavian artery stenosis despite soft and calcified plaque. The right vertebral artery is non dominant. The right vertebral artery origin is occluded along with soft and calcified plaque, but there is some reconstituted flow in the right V1 segment, and intermittently in the right V2 segment, although the vessel appears mostly occluded between the C1 and C4 levels. There is reconstituted flow again in the right V3 segment such that the vessel appears patent at the skullbase. No proximal left subclavian artery hemodynamically significant stenosis despite calcified and bulky soft atherosclerotic plaque (series 9, image 275). The left vertebral artery is dominant. The left vertebral artery origin is patent with mild stenosis. The left V1 segment is tortuous. The left V2 segment is tortuous. There is no other left vertebral artery stenosis to the skullbase. CTA HEAD Posterior circulation: Dominant left vertebral artery primarily supplies the basilar with minimal V4 segment calcified plaque and no stenosis. Patent left PICA origin. The non dominant distal right vertebral artery is irregular and functionally terminates in the right PICA 0 which is patent. The basilar artery is patent with mild irregularity and no stenosis. The SCA and PCA origins are patent. Posterior communicating arteries are diminutive or absent. The right PCA branches are within normal limits. There is mild to moderate left PCA P2 segment irregularity and stenosis. There is severe stenosis or focal occlusion in the distal left PCA just proximal to the P3 bifurcation best seen on series 12, image 19. There is  distal left PCA enhancement. Anterior circulation: Both ICA siphons are patent. There is bilateral ICA siphon calcified plaque. There is subsequent mild multifocal right siphon stenosis. There is minimal to mild left siphon stenosis. Both ophthalmic artery origins are normal. Both carotid termini are patent. The MCA and ACA origins are patent. The anterior communicating artery is diminutive or absent. The bilateral PCA branches are normal. Right MCA M1 segment, bifurcation, and right MCA branches are within normal limits. Left MCA M1 segment, bifurcation, and left MCA branches are within normal limits. No left MCA  branch occlusion is identified. Venous sinuses: Patent. Anatomic variants: Dominant left vertebral artery. Delayed phase: Stable gray-white matter differentiation throughout the brain. Small areas of cortical encephalomalacia along the posterior left sylvian fissure appear to be related to ischemia which was acute at the time of the 10/14/2015 MRI. No acute cortically based infarct identified. No abnormal enhancement identified. Review of the MIP images confirms the above findings IMPRESSION: 1. Negative for emergent large vessel occlusion. 2. Positive for hemodynamically significant bilateral ICA origin atherosclerosis related to calcified plaque left (estimated at 70%) greater than right (60%). No significant intracranial anterior circulation stenosis despite bilateral ICA siphon calcified plaque. 3. Positive for intermittent occlusion of the non dominant right vertebral artery. There is reconstituted flow to the right V4 segment which functionally terminates in the right PICA. 4. Positive for severe distal left PCA stenosis (distal P2/P3 level). 5. Dominant left vertebral artery with only mild origin stenosis. 6. Stable CT appearance of the brain from earlier today. Electronically Signed   By: Genevie Ann M.D.   On: 12/06/2016 08:29   Dg Chest 2 View  Result Date: 12/06/2016 CLINICAL DATA:  Aphasia  EXAM: CHEST  2 VIEW COMPARISON:  November 15, 2014 FINDINGS: There is interstitial edema in the base regions. There is no airspace consolidation or volume loss. There is cardiomegaly with mild pulmonary venous hypertension. There is atherosclerotic calcification in the aorta. No adenopathy. There is degenerative change in each shoulder. Bones are osteoporotic. IMPRESSION: Evidence of a degree of congestive heart failure. No airspace consolidation. There is aortic atherosclerosis. Electronically Signed   By: Lowella Grip III M.D.   On: 12/06/2016 08:21   Ct Angio Neck W And/or Wo Contrast  Result Date: 12/06/2016 CLINICAL DATA:  82 year old female code stroke, aphasia. Initial encounter. EXAM: CT ANGIOGRAPHY HEAD AND NECK TECHNIQUE: Multidetector CT imaging of the head and neck was performed using the standard protocol during bolus administration of intravenous contrast. Multiplanar CT image reconstructions and MIPs were obtained to evaluate the vascular anatomy. Carotid stenosis measurements (when applicable) are obtained utilizing NASCET criteria, using the distal internal carotid diameter as the denominator. CONTRAST:  75 mL Isovue 370 COMPARISON:  Head CT without contrast 0641 hours today. Brain MRI 10/14/2015. Cervical spine CT 11/08/2008. FINDINGS: CTA NECK Skeleton: Mild progression of degenerative cervical spine spondylolisthesis since 2009. 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 Upper thoracic spine mild scoliosis, and endplate degeneration. No acute osseous abnormality identified. Visualized paranasal sinuses and mastoids are stable and well pneumatized. Upper chest: Negative. Other neck: Thyroid, larynx, pharynx, parapharyngeal spaces, retropharyngeal space, sublingual space, submandibular glands and parotid glands are within normal limits. No cervical lymphadenopathy. Aortic arch: 3 vessel arch configuration with moderate  mostly calcified arch and proximal great vessel atherosclerosis. Right carotid system: No brachiocephalic artery or right CCA origin stenosis despite atherosclerosis. Tortuous proximal right CCA with a kinked appearance at the thoracic inlet abutting the tracheoesophageal groove (series 13, image 30). No other right CCA stenosis proximal to the bifurcation. Soft and calcified plaque at the right carotid bifurcation affecting the right ICA origin and bulb. Stenosis up to 60 % with respect to the distal vessel occurs. Otherwise negative cervical right ICA. Left carotid system: No left CCA origin stenosis despite calcified plaque. Mildly tortuous proximal left CCA. Anterior left CCA calcified plaque at the level of the hypopharynx. At the left carotid bifurcation there is bulky calcified plaque resulting in high-grade stenosis of  the proximal left ICA, numerically estimated at 70. The left ICA remains patent and distal to the bulb the cervical left ICA is negative. % with respect to the distal vessel Vertebral arteries: No proximal right subclavian artery stenosis despite soft and calcified plaque. The right vertebral artery is non dominant. The right vertebral artery origin is occluded along with soft and calcified plaque, but there is some reconstituted flow in the right V1 segment, and intermittently in the right V2 segment, although the vessel appears mostly occluded between the C1 and C4 levels. There is reconstituted flow again in the right V3 segment such that the vessel appears patent at the skullbase. No proximal left subclavian artery hemodynamically significant stenosis despite calcified and bulky soft atherosclerotic plaque (series 9, image 275). The left vertebral artery is dominant. The left vertebral artery origin is patent with mild stenosis. The left V1 segment is tortuous. The left V2 segment is tortuous. There is no other left vertebral artery stenosis to the skullbase. CTA HEAD Posterior  circulation: Dominant left vertebral artery primarily supplies the basilar with minimal V4 segment calcified plaque and no stenosis. Patent left PICA origin. The non dominant distal right vertebral artery is irregular and functionally terminates in the right PICA 0 which is patent. The basilar artery is patent with mild irregularity and no stenosis. The SCA and PCA origins are patent. Posterior communicating arteries are diminutive or absent. The right PCA branches are within normal limits. There is mild to moderate left PCA P2 segment irregularity and stenosis. There is severe stenosis or focal occlusion in the distal left PCA just proximal to the P3 bifurcation best seen on series 12, image 19. There is distal left PCA enhancement. Anterior circulation: Both ICA siphons are patent. There is bilateral ICA siphon calcified plaque. There is subsequent mild multifocal right siphon stenosis. There is minimal to mild left siphon stenosis. Both ophthalmic artery origins are normal. Both carotid termini are patent. The MCA and ACA origins are patent. The anterior communicating artery is diminutive or absent. The bilateral PCA branches are normal. Right MCA M1 segment, bifurcation, and right MCA branches are within normal limits. Left MCA M1 segment, bifurcation, and left MCA branches are within normal limits. No left MCA branch occlusion is identified. Venous sinuses: Patent. Anatomic variants: Dominant left vertebral artery. Delayed phase: Stable gray-white matter differentiation throughout the brain. Small areas of cortical encephalomalacia along the posterior left sylvian fissure appear to be related to ischemia which was acute at the time of the 10/14/2015 MRI. No acute cortically based infarct identified. No abnormal enhancement identified. Review of the MIP images confirms the above findings IMPRESSION: 1. Negative for emergent large vessel occlusion. 2. Positive for hemodynamically significant bilateral ICA origin  atherosclerosis related to calcified plaque left (estimated at 70%) greater than right (60%). No significant intracranial anterior circulation stenosis despite bilateral ICA siphon calcified plaque. 3. Positive for intermittent occlusion of the non dominant right vertebral artery. There is reconstituted flow to the right V4 segment which functionally terminates in the right PICA. 4. Positive for severe distal left PCA stenosis (distal P2/P3 level). 5. Dominant left vertebral artery with only mild origin stenosis. 6. Stable CT appearance of the brain from earlier today. Electronically Signed   By: Genevie Ann M.D.   On: 12/06/2016 08:29   Mr Brain Wo Contrast  Result Date: 12/06/2016 CLINICAL DATA:  82 year old female with altered mental status, confusion, aphasia. Code stroke. Initial encounter. EXAM: MRI HEAD WITHOUT CONTRAST TECHNIQUE: Multiplanar, multiecho  pulse sequences of the brain and surrounding structures were obtained without intravenous contrast. COMPARISON:  CTA head and neck and noncontrast head CT earlier today. Brain MRI 10/14/2015. FINDINGS: The examination had to be discontinued prior to completion due to patient agitation, lack of cooperation. Diffusion, sagittal T1 weighted and axial T2 weighted imaging only was obtained. Brain: Axial in coronal diffusion weighted imaging is obtained demonstrating several small areas (up to 12 mm) of restricted diffusion in the left MCA territory affecting the left operculum and white matter of the left centrum semiovale (series 3 images 87 and 96. No contralateral or posterior fossa restricted diffusion. No mass effect. No evidence of associated hemorrhage. No ventriculomegaly. Stable gray and white matter signal otherwise. Vascular: Major intracranial vascular flow voids are stable since 2016. Skull and upper cervical spine: Chronic degenerative changes at the skullbase and upper cervical spine have mildly progressed since 2016. Sinuses/Orbits: Visualized  paranasal sinuses and mastoids are stable and well pneumatized. Stable orbit and scalp soft tissues. IMPRESSION: 1. Several small acute infarcts in the left MCA territory affecting the operculum and left centrum semiovale. No evidence of associated hemorrhage or mass effect. 2. No other acute intracranial abnormality identified. 3.  The examination had to be discontinued prior to completion. Electronically Signed   By: Genevie Ann M.D.   On: 12/06/2016 11:22   Ct Head Code Stroke W/o Cm  Result Date: 12/06/2016 CLINICAL DATA:  Code stroke.  Aphasia.  Unknown time last seen well. EXAM: CT HEAD WITHOUT CONTRAST TECHNIQUE: Contiguous axial images were obtained from the base of the skull through the vertex without intravenous contrast. COMPARISON:  CT head 10/13/2015.  MRI brain 10/14/2015. FINDINGS: Brain: Remote left parietal and basal ganglia infarcts are noted. Remote right parietal lobe infarcts are noted as well. No acute cortical infarct is present. There is no acute hemorrhage or mass lesion. The ventricles are proportionate to the degree of atrophy. No significant extra-axial fluid collection is present. The brainstem and cerebellum are normal. Vascular: Atherosclerotic calcifications are again seen. There is no hyperdense vessel. Skull: The calvarium is intact. Sinuses/Orbits: The paranasal sinuses are clear. There is minimal fluid in the left mastoid air cells. No obstructing nasopharyngeal lesion is present. Bilateral lens replacements are again noted. The globes and orbits are otherwise unremarkable. ASPECTS [facility] Stroke Program Early CT Score) - Ganglionic level infarction (caudate, lentiform nuclei, internal capsule, insula, M1-M3 cortex): 7/7 - Supraganglionic infarction (M4-M6 cortex): 3/3 Total score (0-10 with 10 being normal): 10/10 IMPRESSION: 1. No acute intracranial abnormality. 2. Remote bilateral parietal lobe infarcts. 3. Remote left caudate head infarct. 4. Diffuse atrophy and white matter  disease is similar to the prior exam. 5. Atherosclerosis without a hyperdense vessel. 6. No acute intracranial abnormality. 7. ASPECTS is 10/10 These results were called by telephone at the time of interpretation on 12/06/2016 at 6:57 am to Dr. Brantley Stage , who verbally acknowledged these results. Electronically Signed   By: San Morelle M.D.   On: 12/06/2016 06:58    Medications:  Prior to Admission:  Prescriptions Prior to Admission  Medication Sig Dispense Refill Last Dose  . acetaminophen (TYLENOL) 650 MG CR tablet Take 1,300 mg by mouth 3 (three) times daily.    12/05/2016 at 2100  . ANORO ELLIPTA 62.5-25 MCG/INH AEPB Inhale 1 Inhaler into the lungs daily.    12/05/2016 at Unknown time  . aspirin 81 MG tablet Take 81 mg by mouth daily.   12/05/2016 at Unknown time  . atorvastatin (  LIPITOR) 80 MG tablet Take 1 tablet (80 mg total) by mouth daily at 6 PM.   12/05/2016 at Unknown time  . cycloSPORINE (RESTASIS) 0.05 % ophthalmic emulsion Place 1 drop into both eyes every 12 (twelve) hours.    12/05/2016 at Unknown time  . diltiazem (CARDIZEM CD) 180 MG 24 hr capsule TAKE ONE CAPSULE BY MOUTH ONCE DAILY. 30 capsule 6 12/05/2016 at Unknown time  . fluticasone (FLONASE) 50 MCG/ACT nasal spray Place 2 sprays into the nose daily as needed for allergies. Reported on 06/27/2016   unknown  . furosemide (LASIX) 20 MG tablet TAKE 1 TABLET BY MOUTH DAILY. 30 tablet 6 12/05/2016 at Unknown time  . Multiple Vitamin (MULITIVITAMIN WITH MINERALS) TABS Take 1 tablet by mouth every other day. Takes 1 tab 2-3 times a week   12/05/2016 at Unknown time  . omeprazole (PRILOSEC) 20 MG capsule Take 20 mg by mouth daily.     12/05/2016 at Unknown time  . ondansetron (ZOFRAN) 4 MG tablet Take 4 mg by mouth every 6 (six) hours as needed for nausea or vomiting.   Past Month at Unknown time  . OXYGEN Inhale 2 L into the lungs daily. Oxygen @@ 2 L/mn via qhs. Twice a day    12/05/2016 at Unknown time  . Polyethyl  Glycol-Propyl Glycol (SYSTANE OP) Apply 1 drop to eye 3 (three) times daily. For dry eyes   12/05/2016 at Unknown time  . potassium chloride SA (K-DUR,KLOR-CON) 20 MEQ tablet TAKE ONE TABLET BY MOUTH EVERY OTHER DAY. TAKE ONE THE DAY YOU TAKE FUROSEMIDE. (Patient taking differently: TAKE ONE TABLET BY MOUTH EVERY  DAY.) 30 tablet 3 12/05/2016 at Unknown time  . PROAIR HFA 108 (90 BASE) MCG/ACT inhaler 2 puffs 3 (three) times daily as needed for wheezing (cough).    unknown  . PROCTO-MED HC 2.5 % rectal cream Place 1 application rectally daily as needed for hemorrhoids.    unknown  . traMADol (ULTRAM) 50 MG tablet Take 50 mg by mouth every 8 (eight) hours as needed for moderate pain.    11/29/2016 at Unknown time   Scheduled: . aspirin  300 mg Rectal Daily   Or  . aspirin  325 mg Oral Daily  . enoxaparin (LOVENOX) injection  40 mg Subcutaneous Q24H   Continuous: . sodium chloride 75 mL/hr at 12/07/16 0316   VXY:[id number] **OR** acetaminophen (TYLENOL) oral liquid 160 mg/5 mL **OR** acetaminophen, diclofenac sodium, senna-docusate, traMADol  Assesment: She was admitted with a stroke. She has chronic atrial fibrillation she is not on anticoagulation because of multiple falls and GI bleeding. She is better this morning. Speech is able to be understood. PT evaluation underway. Principal Problem:   Acute CVA (cerebrovascular accident) [facility]) Active Problems:   Essential hypertension, benign   Atrial fibrillation (HCC)   Chronic diastolic heart failure (Circ[place])   Aphasia    Plan: Continue current treatments.    LOS: 1 day   Brionne Mertz L 12/07/2016, 8:46 AM

## 2016-12-07 NOTE — Evaluation (Signed)
Physical Therapy Evaluation Patient Details Name: Wendy Perkins MRN: [id number] DOB: 1919/09/09 Today's Date: 12/07/2016   History of Present Illness  Wendy Perkins is a 82 y.o. female with h/o a fib not anticoagulated due to frequent falls and bleeding risk, HTN and HLD. She is unable to provide any history; history obtained by EDP and family at bedside. Apparently she woke up at 3:30 and requested assistance going to bathroom and appeared normal at that time. When she woke again at around 8 am she was having difficulty communicating and was sent to the ED for evaluation. MRI has subsequently confirmed an acute CVA   Clinical Impression  Pt is a 82 yo female who has had a recent stroke.  She is a resident at the [facility] and was able to ambulate with assistance with shuffling steps for up to ten feet prior to her CVA.  Pt will benefit to return to the [facility] for continued rehabilitation to progress her to her prior functional state.     Follow Up Recommendations SNF    Equipment Recommendations  None recommended by PT    Recommendations for Other Services       Precautions / Restrictions Precautions Precautions: Fall Restrictions Weight Bearing Restrictions: No      Mobility  Bed Mobility Overal bed mobility: Modified Independent                Transfers Overall transfer level: Needs assistance Equipment used: Rolling walker (2 wheeled) Transfers: Sit to/from [facility] Sit to Stand: Min assist (verbal cuing for pushing up from bed) Stand pivot transfers: Min assist          Ambulation/Gait Ambulation/Gait assistance: Min assist Ambulation Distance (Feet): 3 Feet Assistive device: Rolling walker (2 wheeled) Gait Pattern/deviations: Decreased step length - right;Decreased step length - left   Gait velocity interpretation: Below normal speed for age/gender    Stairs            Wheelchair Mobility    Modified Rankin (Stroke  Patients Only)       Balance                                             Pertinent Vitals/Pain Pain Assessment: No/denies pain    Home Living Family/patient expects to be discharged to:: Skilled nursing facility [facility])                      Prior Function Level of Independence: Needs assistance   Gait / Transfers Assistance Needed: Pt reports she uses wheelchair and walker at [facility]  ADL's / Homemaking Assistance Needed: [facility] staff assist with all ADL tasks-dressing, bathing, grooming, meal prep        Hand Dominance   Dominant Hand: Right    Extremity/Trunk Assessment   Upper Extremity Assessment Upper Extremity Assessment: Overall WFL for tasks assessed    Lower Extremity Assessment Lower Extremity Assessment: Generalized weakness       Communication   Communication: Expressive difficulties;HOH  Cognition Arousal/Alertness: Awake/alert Behavior During Therapy: WFL for tasks assessed/performed Overall Cognitive Status: Within Functional Limits for tasks assessed                      General Comments      Exercises General Exercises - Lower Extremity Ankle Circles/Pumps: Both;10 reps Quad Sets:  Both;10 reps Long Arc Quad: Both;10 reps Heel Slides: Both;5 reps Hip ABduction/ADduction: Both;5 reps Hip Flexion/Marching: Both;5 reps;Seated   Assessment/Plan    PT Assessment    PT Problem List Decreased strength;Decreased balance;Decreased activity tolerance;Decreased mobility          PT Treatment Interventions Gait training;Therapeutic exercise;Therapeutic activities    PT Goals (Current goals can be found in the Care Plan section)  Acute Rehab PT Goals Patient Stated Goal: To be more steady on her feet  PT Goal Formulation: With patient/family Time For Goal Achievement: 12/09/16    Frequency Min 5X/week   Barriers to discharge        Co-evaluation               End of Session Equipment Utilized During  Treatment: Gait belt Activity Tolerance: Patient tolerated treatment well Patient left: in bed;with call bell/phone within reach           Time: 1000-1039 PT Time Calculation (min) (ACUTE ONLY): 39 min   Charges:   PT Evaluation $PT Eval Moderate Complexity: 1 Procedure PT Treatments $Therapeutic Exercise: 8-22 mins   PT G CodesRayetta Humphrey, PT CLT [phone] 12/07/2016, 10:41 AM

## 2016-12-07 NOTE — Progress Notes (Signed)
Patient has still been unable to see neurology which was consulted yesterday. Patient family requesting to speak with primary MD. Neurology and primary MD paged. Neurology responded to page, instructed to inform family he will be present within the hour. Awaiting response from primary MD at this time.  Celestia Khat, RN

## 2016-12-08 ENCOUNTER — Inpatient Hospital Stay
Admission: RE | Admit: 2016-12-08 | Discharge: 2017-07-08 | Disposition: A | Discharge status: Other Acute Inpatient Hospital | Payer: Medicare Other | Source: Ambulatory Visit | Attending: Internal Medicine | Admitting: Internal Medicine

## 2016-12-08 DIAGNOSIS — R52 Pain, unspecified: Principal | ICD-10-CM

## 2016-12-08 DIAGNOSIS — R609 Edema, unspecified: Secondary | ICD-10-CM

## 2016-12-08 DIAGNOSIS — R601 Generalized edema: Secondary | ICD-10-CM

## 2016-12-08 LAB — HEMOGLOBIN A1C
Hgb A1c MFr Bld: 5.5 % (ref 4.8–5.6)
Mean Plasma Glucose: 111 mg/dL

## 2016-12-08 MED ORDER — ASPIRIN 300 MG RE SUPP: 300.0000 mg | Freq: Every day | RECTAL | 0 refills | Status: DC

## 2016-12-08 MED ORDER — APIXABAN 2.5 MG PO TABS: 2.5000 mg | ORAL_TABLET | Freq: Two times a day (BID) | ORAL | Status: DC

## 2016-12-08 NOTE — Clinical Social Work Note (Signed)
Per MD patient ready to DC back to [facility]. RN, patient/family, and facility notified of patient's DC. RN given number for report. DC Summary sent through [facility] to [facility]. CSW signing off at this time.   Liz Beach MSW, [place], [place], [id number]

## 2016-12-08 NOTE — Discharge Summary (Signed)
Physician Discharge Summary  Patient ID: Wendy Perkins MRN: [id number] DOB/AGE: January 12, 1919 82 y.o. Primary Care Physician:No primary care provider on file. Admit date: 12/06/2016 Discharge date: 12/08/2016    Discharge Diagnoses:   Principal Problem:   Acute CVA (cerebrovascular accident) [facility]) Active Problems:   Essential hypertension, benign   Atrial fibrillation (HCC)   Chronic diastolic heart failure (HCC)   Aphasia Arthritis of multiple joints  Allergies as of 12/08/2016      Reactions   Sulfur Swelling   Rash also per patient      Medication List    STOP taking these medications   aspirin 81 MG tablet Replaced by:  aspirin 300 MG suppository     TAKE these medications   acetaminophen 650 MG CR tablet Commonly known as:  TYLENOL Take 1,300 mg by mouth 3 (three) times daily.   ANORO ELLIPTA 62.5-25 MCG/INH Aepb Generic drug:  umeclidinium-vilanterol Inhale 1 Inhaler into the lungs daily.   apixaban 2.5 MG Tabs tablet Commonly known as:  ELIQUIS Take 1 tablet (2.5 mg total) by mouth 2 (two) times daily.   aspirin 300 MG suppository Place 1 suppository (300 mg total) rectally daily. Start taking on:  12/09/2016 Replaces:  aspirin 81 MG tablet   atorvastatin 80 MG tablet Commonly known as:  LIPITOR Take 1 tablet (80 mg total) by mouth daily at 6 PM.   cycloSPORINE 0.05 % ophthalmic emulsion Commonly known as:  RESTASIS Place 1 drop into both eyes every 12 (twelve) hours.   diltiazem 180 MG 24 hr capsule Commonly known as:  CARDIZEM CD TAKE ONE CAPSULE BY MOUTH ONCE DAILY.   fluticasone 50 MCG/ACT nasal spray Commonly known as:  FLONASE Place 2 sprays into the nose daily as needed for allergies. Reported on 06/27/2016   furosemide 20 MG tablet Commonly known as:  LASIX TAKE 1 TABLET BY MOUTH DAILY.   multivitamin with minerals Tabs tablet Take 1 tablet by mouth every other day. Takes 1 tab 2-3 times a week   omeprazole 20 MG capsule Commonly  known as:  PRILOSEC Take 20 mg by mouth daily.   ondansetron 4 MG tablet Commonly known as:  ZOFRAN Take 4 mg by mouth every 6 (six) hours as needed for nausea or vomiting.   OXYGEN Inhale 2 L into the lungs daily. Oxygen @@ 2 L/mn via qhs. Twice a day   potassium chloride SA 20 MEQ tablet Commonly known as:  K-DUR,KLOR-CON TAKE ONE TABLET BY MOUTH EVERY OTHER DAY. TAKE ONE THE DAY YOU TAKE FUROSEMIDE. What changed:  See the new instructions.   PROAIR HFA 108 (90 Base) MCG/ACT inhaler Generic drug:  albuterol 2 puffs 3 (three) times daily as needed for wheezing (cough).   PROCTO-MED HC 2.5 % rectal cream Generic drug:  hydrocortisone Place 1 application rectally daily as needed for hemorrhoids.   SYSTANE OP Apply 1 drop to eye 3 (three) times daily. For dry eyes   traMADol 50 MG tablet Commonly known as:  ULTRAM Take 50 mg by mouth every 8 (eight) hours as needed for moderate pain.       Discharged Condition:Improved    Consults: Neurology  Significant Diagnostic Studies: Ct Angio Head W Or Wo Contrast  Result Date: 12/06/2016 CLINICAL DATA:  82 year old female code stroke, aphasia. Initial encounter. EXAM: CT ANGIOGRAPHY HEAD AND NECK TECHNIQUE: Multidetector CT imaging of the head and neck was performed using the standard protocol during bolus administration of intravenous contrast. Multiplanar CT image reconstructions and MIPs  were obtained to evaluate the vascular anatomy. Carotid stenosis measurements (when applicable) are obtained utilizing NASCET criteria, using the distal internal carotid diameter as the denominator. CONTRAST:  75 mL Isovue 370 COMPARISON:  Head CT without contrast 0641 hours today. Brain MRI 10/14/2015. Cervical spine CT 11/08/2008. FINDINGS: CTA NECK Skeleton: Mild progression of degenerative cervical spine spondylolisthesis since 2009. 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  Upper thoracic spine mild scoliosis, and endplate degeneration. No acute osseous abnormality identified. Visualized paranasal sinuses and mastoids are stable and well pneumatized. Upper chest: Negative. Other neck: Thyroid, larynx, pharynx, parapharyngeal spaces, retropharyngeal space, sublingual space, submandibular glands and parotid glands are within normal limits. No cervical lymphadenopathy. Aortic arch: 3 vessel arch configuration with moderate mostly calcified arch and proximal great vessel atherosclerosis. Right carotid system: No brachiocephalic artery or right CCA origin stenosis despite atherosclerosis. Tortuous proximal right CCA with a kinked appearance at the thoracic inlet abutting the tracheoesophageal groove (series 13, image 30). No other right CCA stenosis proximal to the bifurcation. Soft and calcified plaque at the right carotid bifurcation affecting the right ICA origin and bulb. Stenosis up to 60 % with respect to the distal vessel occurs. Otherwise negative cervical right ICA. Left carotid system: No left CCA origin stenosis despite calcified plaque. Mildly tortuous proximal left CCA. Anterior left CCA calcified plaque at the level of the hypopharynx. At the left carotid bifurcation there is bulky calcified plaque resulting in high-grade stenosis of the proximal left ICA, numerically estimated at 70. The left ICA remains patent and distal to the bulb the cervical left ICA is negative. % with respect to the distal vessel Vertebral arteries: No proximal right subclavian artery stenosis despite soft and calcified plaque. The right vertebral artery is non dominant. The right vertebral artery origin is occluded along with soft and calcified plaque, but there is some reconstituted flow in the right V1 segment, and intermittently in the right V2 segment, although the vessel appears mostly occluded between the C1 and C4 levels. There is reconstituted flow again in the right V3 segment such that the  vessel appears patent at the skullbase. No proximal left subclavian artery hemodynamically significant stenosis despite calcified and bulky soft atherosclerotic plaque (series 9, image 275). The left vertebral artery is dominant. The left vertebral artery origin is patent with mild stenosis. The left V1 segment is tortuous. The left V2 segment is tortuous. There is no other left vertebral artery stenosis to the skullbase. CTA HEAD Posterior circulation: Dominant left vertebral artery primarily supplies the basilar with minimal V4 segment calcified plaque and no stenosis. Patent left PICA origin. The non dominant distal right vertebral artery is irregular and functionally terminates in the right PICA 0 which is patent. The basilar artery is patent with mild irregularity and no stenosis. The SCA and PCA origins are patent. Posterior communicating arteries are diminutive or absent. The right PCA branches are within normal limits. There is mild to moderate left PCA P2 segment irregularity and stenosis. There is severe stenosis or focal occlusion in the distal left PCA just proximal to the P3 bifurcation best seen on series 12, image 19. There is distal left PCA enhancement. Anterior circulation: Both ICA siphons are patent. There is bilateral ICA siphon calcified plaque. There is subsequent mild multifocal right siphon stenosis. There is minimal to mild left siphon stenosis. Both ophthalmic artery origins are normal. Both carotid termini are patent. The MCA and ACA  origins are patent. The anterior communicating artery is diminutive or absent. The bilateral PCA branches are normal. Right MCA M1 segment, bifurcation, and right MCA branches are within normal limits. Left MCA M1 segment, bifurcation, and left MCA branches are within normal limits. No left MCA branch occlusion is identified. Venous sinuses: Patent. Anatomic variants: Dominant left vertebral artery. Delayed phase: Stable gray-white matter differentiation  throughout the brain. Small areas of cortical encephalomalacia along the posterior left sylvian fissure appear to be related to ischemia which was acute at the time of the 10/14/2015 MRI. No acute cortically based infarct identified. No abnormal enhancement identified. Review of the MIP images confirms the above findings IMPRESSION: 1. Negative for emergent large vessel occlusion. 2. Positive for hemodynamically significant bilateral ICA origin atherosclerosis related to calcified plaque left (estimated at 70%) greater than right (60%). No significant intracranial anterior circulation stenosis despite bilateral ICA siphon calcified plaque. 3. Positive for intermittent occlusion of the non dominant right vertebral artery. There is reconstituted flow to the right V4 segment which functionally terminates in the right PICA. 4. Positive for severe distal left PCA stenosis (distal P2/P3 level). 5. Dominant left vertebral artery with only mild origin stenosis. 6. Stable CT appearance of the brain from earlier today. Electronically Signed   By: Genevie Ann M.D.   On: 12/06/2016 08:29   Dg Chest 2 View  Result Date: 12/06/2016 CLINICAL DATA:  Aphasia EXAM: CHEST  2 VIEW COMPARISON:  November 15, 2014 FINDINGS: There is interstitial edema in the base regions. There is no airspace consolidation or volume loss. There is cardiomegaly with mild pulmonary venous hypertension. There is atherosclerotic calcification in the aorta. No adenopathy. There is degenerative change in each shoulder. Bones are osteoporotic. IMPRESSION: Evidence of a degree of congestive heart failure. No airspace consolidation. There is aortic atherosclerosis. Electronically Signed   By: Lowella Grip III M.D.   On: 12/06/2016 08:21   Ct Angio Neck W And/or Wo Contrast  Result Date: 12/06/2016 CLINICAL DATA:  82 year old female code stroke, aphasia. Initial encounter. EXAM: CT ANGIOGRAPHY HEAD AND NECK TECHNIQUE: Multidetector CT imaging of the head  and neck was performed using the standard protocol during bolus administration of intravenous contrast. Multiplanar CT image reconstructions and MIPs were obtained to evaluate the vascular anatomy. Carotid stenosis measurements (when applicable) are obtained utilizing NASCET criteria, using the distal internal carotid diameter as the denominator. CONTRAST:  75 mL Isovue 370 COMPARISON:  Head CT without contrast 0641 hours today. Brain MRI 10/14/2015. Cervical spine CT 11/08/2008. FINDINGS: CTA NECK Skeleton: Mild progression of degenerative cervical spine spondylolisthesis since 2009. 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 Upper thoracic spine mild scoliosis, and endplate degeneration. No acute osseous abnormality identified. Visualized paranasal sinuses and mastoids are stable and well pneumatized. Upper chest: Negative. Other neck: Thyroid, larynx, pharynx, parapharyngeal spaces, retropharyngeal space, sublingual space, submandibular glands and parotid glands are within normal limits. No cervical lymphadenopathy. Aortic arch: 3 vessel arch configuration with moderate mostly calcified arch and proximal great vessel atherosclerosis. Right carotid system: No brachiocephalic artery or right CCA origin stenosis despite atherosclerosis. Tortuous proximal right CCA with a kinked appearance at the thoracic inlet abutting the tracheoesophageal groove (series 13, image 30). No other right CCA stenosis proximal to the bifurcation. Soft and calcified plaque at the right carotid bifurcation affecting the right ICA origin and bulb. Stenosis up to 60 % with respect to the distal vessel occurs. Otherwise  negative cervical right ICA. Left carotid system: No left CCA origin stenosis despite calcified plaque. Mildly tortuous proximal left CCA. Anterior left CCA calcified plaque at the level of the hypopharynx. At the left carotid bifurcation there is bulky  calcified plaque resulting in high-grade stenosis of the proximal left ICA, numerically estimated at 70. The left ICA remains patent and distal to the bulb the cervical left ICA is negative. % with respect to the distal vessel Vertebral arteries: No proximal right subclavian artery stenosis despite soft and calcified plaque. The right vertebral artery is non dominant. The right vertebral artery origin is occluded along with soft and calcified plaque, but there is some reconstituted flow in the right V1 segment, and intermittently in the right V2 segment, although the vessel appears mostly occluded between the C1 and C4 levels. There is reconstituted flow again in the right V3 segment such that the vessel appears patent at the skullbase. No proximal left subclavian artery hemodynamically significant stenosis despite calcified and bulky soft atherosclerotic plaque (series 9, image 275). The left vertebral artery is dominant. The left vertebral artery origin is patent with mild stenosis. The left V1 segment is tortuous. The left V2 segment is tortuous. There is no other left vertebral artery stenosis to the skullbase. CTA HEAD Posterior circulation: Dominant left vertebral artery primarily supplies the basilar with minimal V4 segment calcified plaque and no stenosis. Patent left PICA origin. The non dominant distal right vertebral artery is irregular and functionally terminates in the right PICA 0 which is patent. The basilar artery is patent with mild irregularity and no stenosis. The SCA and PCA origins are patent. Posterior communicating arteries are diminutive or absent. The right PCA branches are within normal limits. There is mild to moderate left PCA P2 segment irregularity and stenosis. There is severe stenosis or focal occlusion in the distal left PCA just proximal to the P3 bifurcation best seen on series 12, image 19. There is distal left PCA enhancement. Anterior circulation: Both ICA siphons are patent.  There is bilateral ICA siphon calcified plaque. There is subsequent mild multifocal right siphon stenosis. There is minimal to mild left siphon stenosis. Both ophthalmic artery origins are normal. Both carotid termini are patent. The MCA and ACA origins are patent. The anterior communicating artery is diminutive or absent. The bilateral PCA branches are normal. Right MCA M1 segment, bifurcation, and right MCA branches are within normal limits. Left MCA M1 segment, bifurcation, and left MCA branches are within normal limits. No left MCA branch occlusion is identified. Venous sinuses: Patent. Anatomic variants: Dominant left vertebral artery. Delayed phase: Stable gray-white matter differentiation throughout the brain. Small areas of cortical encephalomalacia along the posterior left sylvian fissure appear to be related to ischemia which was acute at the time of the 10/14/2015 MRI. No acute cortically based infarct identified. No abnormal enhancement identified. Review of the MIP images confirms the above findings IMPRESSION: 1. Negative for emergent large vessel occlusion. 2. Positive for hemodynamically significant bilateral ICA origin atherosclerosis related to calcified plaque left (estimated at 70%) greater than right (60%). No significant intracranial anterior circulation stenosis despite bilateral ICA siphon calcified plaque. 3. Positive for intermittent occlusion of the non dominant right vertebral artery. There is reconstituted flow to the right V4 segment which functionally terminates in the right PICA. 4. Positive for severe distal left PCA stenosis (distal P2/P3 level). 5. Dominant left vertebral artery with only mild origin stenosis. 6. Stable CT appearance of the brain from earlier today. Electronically  Signed   By: Genevie Ann M.D.   On: 12/06/2016 08:29   Mr Brain Wo Contrast  Result Date: 12/06/2016 CLINICAL DATA:  82 year old female with altered mental status, confusion, aphasia. Code stroke.  Initial encounter. EXAM: MRI HEAD WITHOUT CONTRAST TECHNIQUE: Multiplanar, multiecho pulse sequences of the brain and surrounding structures were obtained without intravenous contrast. COMPARISON:  CTA head and neck and noncontrast head CT earlier today. Brain MRI 10/14/2015. FINDINGS: The examination had to be discontinued prior to completion due to patient agitation, lack of cooperation. Diffusion, sagittal T1 weighted and axial T2 weighted imaging only was obtained. Brain: Axial in coronal diffusion weighted imaging is obtained demonstrating several small areas (up to 12 mm) of restricted diffusion in the left MCA territory affecting the left operculum and white matter of the left centrum semiovale (series 3 images 87 and 96. No contralateral or posterior fossa restricted diffusion. No mass effect. No evidence of associated hemorrhage. No ventriculomegaly. Stable gray and white matter signal otherwise. Vascular: Major intracranial vascular flow voids are stable since 2016. Skull and upper cervical spine: Chronic degenerative changes at the skullbase and upper cervical spine have mildly progressed since 2016. Sinuses/Orbits: Visualized paranasal sinuses and mastoids are stable and well pneumatized. Stable orbit and scalp soft tissues. IMPRESSION: 1. Several small acute infarcts in the left MCA territory affecting the operculum and left centrum semiovale. No evidence of associated hemorrhage or mass effect. 2. No other acute intracranial abnormality identified. 3.  The examination had to be discontinued prior to completion. Electronically Signed   By: Genevie Ann M.D.   On: 12/06/2016 11:22   Ct Head Code Stroke W/o Cm  Result Date: 12/06/2016 CLINICAL DATA:  Code stroke.  Aphasia.  Unknown time last seen well. EXAM: CT HEAD WITHOUT CONTRAST TECHNIQUE: Contiguous axial images were obtained from the base of the skull through the vertex without intravenous contrast. COMPARISON:  CT head 10/13/2015.  MRI brain  10/14/2015. FINDINGS: Brain: Remote left parietal and basal ganglia infarcts are noted. Remote right parietal lobe infarcts are noted as well. No acute cortical infarct is present. There is no acute hemorrhage or mass lesion. The ventricles are proportionate to the degree of atrophy. No significant extra-axial fluid collection is present. The brainstem and cerebellum are normal. Vascular: Atherosclerotic calcifications are again seen. There is no hyperdense vessel. Skull: The calvarium is intact. Sinuses/Orbits: The paranasal sinuses are clear. There is minimal fluid in the left mastoid air cells. No obstructing nasopharyngeal lesion is present. Bilateral lens replacements are again noted. The globes and orbits are otherwise unremarkable. ASPECTS [facility] Stroke Program Early CT Score) - Ganglionic level infarction (caudate, lentiform nuclei, internal capsule, insula, M1-M3 cortex): 7/7 - Supraganglionic infarction (M4-M6 cortex): 3/3 Total score (0-10 with 10 being normal): 10/10 IMPRESSION: 1. No acute intracranial abnormality. 2. Remote bilateral parietal lobe infarcts. 3. Remote left caudate head infarct. 4. Diffuse atrophy and white matter disease is similar to the prior exam. 5. Atherosclerosis without a hyperdense vessel. 6. No acute intracranial abnormality. 7. ASPECTS is 10/10 These results were called by telephone at the time of interpretation on 12/06/2016 at 6:57 am to Dr. Brantley Stage , who verbally acknowledged these results. Electronically Signed   By: San Morelle M.D.   On: 12/06/2016 06:58    Lab Results: Basic Metabolic Panel:  Recent Labs  12/06/16 0615 12/06/16 0638  NA 138 141  K 3.8 3.8  CL 106 107  CO2 22  --   GLUCOSE 98 93  BUN 17 16  CREATININE 0.71 0.70  CALCIUM 9.3  --    Liver Function Tests:  Recent Labs  12/06/16 0615  AST 23  ALT 16  ALKPHOS 99  BILITOT 1.2  PROT 6.8  ALBUMIN 3.8     CBC:  Recent Labs  12/06/16 0615 12/06/16 0638  WBC 9.7   --   NEUTROABS 6.0  --   HGB 14.2 14.6  HCT 43.4 43.0  MCV 87.1  --   PLT 228  --     Recent Results (from the past 240 hour(s))  MRSA PCR Screening     Status: None   Collection Time: 12/06/16 12:36 PM  Result Value Ref Range Status   MRSA by PCR NEGATIVE NEGATIVE Final    Comment:        The GeneXpert MRSA Assay (FDA approved for NASAL specimens only), is one component of a comprehensive MRSA colonization surveillance program. It is not intended to diagnose MRSA infection nor to guide or monitor treatment for MRSA infections.      Hospital Course: This is a 82 year old who has been in a skilled care facility for about a year. She came to the emergency department with aphasia. CT did not show definite stroke but MRI did. She has chronic atrial fib but has been off anticoagulation because of multiple falls and rectal bleeding. She had neurology consultation and it was felt that since she's had another stroke the benefits outweigh the risks now off putting her back on an anticoagulant. This should be safer since she is at the skilled care facility rather than at home. Her speech improved but did not come back to baseline. She is at maximum hospital benefit and ready for transfer back to the skilled care facility  Discharge Exam: Blood pressure (!) 154/112, pulse 85, temperature 97.5 F (36.4 C), temperature source Oral, resp. rate 18, height 5\' 3"  (1.6 m), weight 71.2 kg (157 lb), SpO2 99 %. She is awake and alert. She is still in atrial fibrillation well-controlled ventricular response. Her speech is better but not back to baseline.  Disposition: She will return to the skilled care facility. She will have PT OT and speech. She will be on dysphagia 3 diet with thin liquids. She will need to be observed closely for evidence of GI bleeding. She will be on the service of Dr. Lyndel Safe  Discharge Instructions    Discharge patient    Complete by:  As directed    To skilled care facility  when bed available        Signed: Waylynn Benefiel L   12/08/2016, 9:59 AM

## 2016-12-08 NOTE — Progress Notes (Signed)
Pt discharged to [facility].  Stroke education for pt and family is complete.  Family has the stroke book, have read it and have no questions at this time.  Family members verbalize understanding of what causes strokes, the signs and symptoms and what to expect during recovery.  Report called to [facility] at [facility].

## 2016-12-08 NOTE — Progress Notes (Signed)
[id number] beeper [phone] exam started 0641 exam finished 0641 images sent to [facility] exam completed in [facility] called

## 2016-12-08 NOTE — Progress Notes (Signed)
Subjective: She was admitted with another stroke. She has a significant speech impediment. She had been on one of the novel anticoagulants but had very high fall risk in fact fell and has had some rectal bleeding. I think it's okay to restart but she needs to be watched carefully to make sure she doesn't have further rectal bleeding. Her speech is about the same. No other new complaints.  Objective: Vital signs in last 24 hours: Temp:  [97.5 F (36.4 C)-98.3 F (36.8 C)] 97.5 F (36.4 C) (12/30 0728) Pulse Rate:  [63-85] 85 (12/30 0728) Resp:  [18] 18 (12/30 0728) BP: (97-176)/(59-112) 154/112 (12/30 0728) SpO2:  [97 %-99 %] 99 % (12/30 0728) Weight change:  Last BM Date: 12/07/16  Intake/Output from previous day: 12/29 0701 - 12/30 0700 In: 1921.3 [P.O.:120; I.V.:1801.3] Out: -   PHYSICAL EXAM General appearance: alert, cooperative, mild distress and Speech is improved from admission but still halting Resp: clear to auscultation bilaterally Cardio: She is still in atrial fib with well-controlled ventricular response GI: soft, non-tender; bowel sounds normal; no masses,  no organomegaly Extremities: extremities normal, atraumatic, no cyanosis or edema Skin warm and dry mucous membranes are moist  Lab Results:  Results for orders placed or performed during the hospital encounter of 12/06/16 (from the past 48 hour(s))  MRSA PCR Screening     Status: None   Collection Time: 12/06/16 12:36 PM  Result Value Ref Range   MRSA by PCR NEGATIVE NEGATIVE    Comment:        The GeneXpert MRSA Assay (FDA approved for NASAL specimens only), is one component of a comprehensive MRSA colonization surveillance program. It is not intended to diagnose MRSA infection nor to guide or monitor treatment for MRSA infections.   Lipid panel     Status: None   Collection Time: 12/07/16  4:21 AM  Result Value Ref Range   Cholesterol 96 0 - 200 mg/dL   Triglycerides 31 <150 mg/dL   HDL 47 >40  mg/dL   Total CHOL/HDL Ratio 2.0 RATIO   VLDL 6 0 - 40 mg/dL   LDL Cholesterol 43 0 - 99 mg/dL    Comment:        Total Cholesterol/HDL:CHD Risk Coronary Heart Disease Risk Table                     Men   Women  1/2 Average Risk   3.4   3.3  Average Risk       5.0   4.4  2 X Average Risk   9.6   7.1  3 X Average Risk  23.4   11.0        Use the calculated Patient Ratio above and the CHD Risk Table to determine the patient's CHD Risk.        ATP III CLASSIFICATION (LDL):  <100     mg/dL   Optimal  100-129  mg/dL   Near or Above                    Optimal  130-159  mg/dL   Borderline  160-189  mg/dL   High  >190     mg/dL   Very High     ABGS  Recent Labs  12/06/16 0638  TCO2 21   CULTURES Recent Results (from the past 240 hour(s))  MRSA PCR Screening     Status: None   Collection Time: 12/06/16 12:36 PM  Result Value  Ref Range Status   MRSA by PCR NEGATIVE NEGATIVE Final    Comment:        The GeneXpert MRSA Assay (FDA approved for NASAL specimens only), is one component of a comprehensive MRSA colonization surveillance program. It is not intended to diagnose MRSA infection nor to guide or monitor treatment for MRSA infections.    Studies/Results: Mr Brain Wo Contrast  Result Date: 12/06/2016 CLINICAL DATA:  82 year old female with altered mental status, confusion, aphasia. Code stroke. Initial encounter. EXAM: MRI HEAD WITHOUT CONTRAST TECHNIQUE: Multiplanar, multiecho pulse sequences of the brain and surrounding structures were obtained without intravenous contrast. COMPARISON:  CTA head and neck and noncontrast head CT earlier today. Brain MRI 10/14/2015. FINDINGS: The examination had to be discontinued prior to completion due to patient agitation, lack of cooperation. Diffusion, sagittal T1 weighted and axial T2 weighted imaging only was obtained. Brain: Axial in coronal diffusion weighted imaging is obtained demonstrating several small areas (up to 12 mm)  of restricted diffusion in the left MCA territory affecting the left operculum and white matter of the left centrum semiovale (series 3 images 87 and 96. No contralateral or posterior fossa restricted diffusion. No mass effect. No evidence of associated hemorrhage. No ventriculomegaly. Stable gray and white matter signal otherwise. Vascular: Major intracranial vascular flow voids are stable since 2016. Skull and upper cervical spine: Chronic degenerative changes at the skullbase and upper cervical spine have mildly progressed since 2016. Sinuses/Orbits: Visualized paranasal sinuses and mastoids are stable and well pneumatized. Stable orbit and scalp soft tissues. IMPRESSION: 1. Several small acute infarcts in the left MCA territory affecting the operculum and left centrum semiovale. No evidence of associated hemorrhage or mass effect. 2. No other acute intracranial abnormality identified. 3.  The examination had to be discontinued prior to completion. Electronically Signed   By: Genevie Ann M.D.   On: 12/06/2016 11:22    Medications:  Prior to Admission:  Prescriptions Prior to Admission  Medication Sig Dispense Refill Last Dose  . acetaminophen (TYLENOL) 650 MG CR tablet Take 1,300 mg by mouth 3 (three) times daily.    12/05/2016 at 2100  . ANORO ELLIPTA 62.5-25 MCG/INH AEPB Inhale 1 Inhaler into the lungs daily.    12/05/2016 at Unknown time  . aspirin 81 MG tablet Take 81 mg by mouth daily.   12/05/2016 at Unknown time  . atorvastatin (LIPITOR) 80 MG tablet Take 1 tablet (80 mg total) by mouth daily at 6 PM.   12/05/2016 at Unknown time  . cycloSPORINE (RESTASIS) 0.05 % ophthalmic emulsion Place 1 drop into both eyes every 12 (twelve) hours.    12/05/2016 at Unknown time  . diltiazem (CARDIZEM CD) 180 MG 24 hr capsule TAKE ONE CAPSULE BY MOUTH ONCE DAILY. 30 capsule 6 12/05/2016 at Unknown time  . fluticasone (FLONASE) 50 MCG/ACT nasal spray Place 2 sprays into the nose daily as needed for allergies.  Reported on 06/27/2016   unknown  . furosemide (LASIX) 20 MG tablet TAKE 1 TABLET BY MOUTH DAILY. 30 tablet 6 12/05/2016 at Unknown time  . Multiple Vitamin (MULITIVITAMIN WITH MINERALS) TABS Take 1 tablet by mouth every other day. Takes 1 tab 2-3 times a week   12/05/2016 at Unknown time  . omeprazole (PRILOSEC) 20 MG capsule Take 20 mg by mouth daily.     12/05/2016 at Unknown time  . ondansetron (ZOFRAN) 4 MG tablet Take 4 mg by mouth every 6 (six) hours as needed for nausea or vomiting.  Past Month at Unknown time  . OXYGEN Inhale 2 L into the lungs daily. Oxygen @@ 2 L/mn via qhs. Twice a day    12/05/2016 at Unknown time  . Polyethyl Glycol-Propyl Glycol (SYSTANE OP) Apply 1 drop to eye 3 (three) times daily. For dry eyes   12/05/2016 at Unknown time  . potassium chloride SA (K-DUR,KLOR-CON) 20 MEQ tablet TAKE ONE TABLET BY MOUTH EVERY OTHER DAY. TAKE ONE THE DAY YOU TAKE FUROSEMIDE. (Patient taking differently: TAKE ONE TABLET BY MOUTH EVERY  DAY.) 30 tablet 3 12/05/2016 at Unknown time  . PROAIR HFA 108 (90 BASE) MCG/ACT inhaler 2 puffs 3 (three) times daily as needed for wheezing (cough).    unknown  . PROCTO-MED HC 2.5 % rectal cream Place 1 application rectally daily as needed for hemorrhoids.    unknown  . traMADol (ULTRAM) 50 MG tablet Take 50 mg by mouth every 8 (eight) hours as needed for moderate pain.    11/29/2016 at Unknown time   Scheduled: . apixaban  2.5 mg Oral BID  . aspirin  300 mg Rectal Daily   Or  . aspirin  325 mg Oral Daily   Continuous: . sodium chloride 75 mL/hr at 12/08/16 0522   [id number] **OR** acetaminophen (TYLENOL) oral liquid 160 mg/5 mL **OR** acetaminophen, diclofenac sodium, senna-docusate, traMADol  Assesment: She had an acute CVA that has left her with significant speech abnormality. She has had previous strokes. She has chronic atrial fib and has not been anticoagulated because of some rectal bleeding and multiple falls. It has been  recommended that she restart anticoagulation since she's at a skilled care facility. She is at maximum hospital benefit and ready for discharge Principal Problem:   Acute CVA (cerebrovascular accident) [facility]) Active Problems:   Essential hypertension, benign   Atrial fibrillation ([place])   Chronic diastolic heart failure ([place])   Aphasia    Plan: Discharge back to skilled care facility    LOS: 2 days   Melisse Caetano L 12/08/2016, 9:52 AM

## 2016-12-08 NOTE — Progress Notes (Signed)
Physical Therapy Treatment Patient Details Name: Wendy Perkins MRN: [id number] DOB: 02/16/1919 Today's Date: 12/08/2016    History of Present Illness Wendy Perkins is a 82 y.o. female with h/o a fib not anticoagulated due to frequent falls and bleeding risk, HTN and HLD. She is unable to provide any history; history obtained by EDP and family at bedside. Apparently she woke up at 3:30 and requested assistance going to bathroom and appeared normal at that time. When she woke again at around 8 am she was having difficulty communicating and was sent to the ED for evaluation. MRI has subsequently confirmed an acute CVA     PT Comments    Patient received in bed with family present, pleasant and willing to participate in skilled PT services today. Patient does complain of neck pain laying in bed. Continues to be able to perform bed mobility with Mod(I), but does continue to require Min(A) for sit to stand transfer with walker. Patient immediately began crying out when standing, with R knee jerking and patient becoming unsteady, so DPT assisted patient in returning to sitting position at EOB. Patient reports 10/10 R knee pain with weight bearing, so gait limited this session; provided Min guard for lateral scooting up bed. Also performed functional strengthening exercises at EOB today. DPT provided patient with a make-shift bolster (rolled up towel) and positioned it behind patient's neck to assist in managing this pain; patient reports that bolster is relieving neck pain at this time. RN notified via nursing station staff that IV is beeping and patient is having 10/10 pain in R knee. Patient left supine in bed with HOB elevated and family present, all needs otherwise met.    Follow Up Recommendations  SNF     Equipment Recommendations  None recommended by PT    Recommendations for Other Services       Precautions / Restrictions Precautions Precautions: Fall Precaution Comments: reason for  admission.  Restrictions Weight Bearing Restrictions: No    Mobility  Bed Mobility Overal bed mobility: Modified Independent                Transfers Overall transfer level: Needs assistance Equipment used: Rolling walker (2 wheeled) Transfers: Sit to/from Stand;Lateral/Scoot Transfers Sit to Stand: Min assist        Lateral/Scoot Transfers: Min guard General transfer comment: limited by R knee pain   Ambulation/Gait             General Gait Details: attempted but patient unable secondary to R knee pain being 10/10 in weight bearing    Stairs            Wheelchair Mobility    Modified Rankin (Stroke Patients Only)       Balance Overall balance assessment: Needs assistance Sitting-balance support: Bilateral upper extremity supported Sitting balance-Leahy Scale: Good     Standing balance support: Bilateral upper extremity supported Standing balance-Leahy Scale: Fair                      Cognition Arousal/Alertness: Awake/alert Behavior During Therapy: WFL for tasks assessed/performed Overall Cognitive Status: Within Functional Limits for tasks assessed                      Exercises General Exercises - Lower Extremity Ankle Circles/Pumps: Strengthening;Both;10 reps;Other (comment) (seated ) Long Arc Quad: Both;10 reps;Other (comment) (seated ) Hip ABduction/ADduction: Both;10 reps;Other (comment) (seated ) Hip Flexion/Marching: Both;10 reps;Other (comment) (seated )  General Comments        Pertinent Vitals/Pain Pain Assessment: 0-10 Pain Score: 5  Pain Location: initially neck however with weight bearing patient experienced pain 10/10 R knee  Pain Descriptors / Indicators: Aching;Discomfort Pain Intervention(s): Limited activity within patient's tolerance;Monitored during session;Repositioned;Patient requesting pain meds-RN notified    Home Living Family/patient expects to be discharged to:: Skilled nursing  facility   Available Help at Discharge: Family;Friend(s);Available PRN/intermittently Type of Home: [place]              Prior Function Level of Independence: Needs assistance  Gait / Transfers Assistance Needed: Pt reports she uses wheelchair and walker at [facility] ADL's / Homemaking Assistance Needed: [facility] staff assist with all ADL tasks-dressing, bathing, grooming, meal prep     PT Goals (current goals can now be found in the care plan section) Acute Rehab PT Goals Patient Stated Goal: To be more steady on her feet  PT Goal Formulation: With patient/family Time For Goal Achievement: 12/09/16 Potential to Achieve Goals: Fair Progress towards PT goals: Progressing toward goals    Frequency    Min 5X/week      PT Plan Current plan remains appropriate    Co-evaluation             End of Session Equipment Utilized During Treatment: Gait belt;Oxygen Activity Tolerance: Patient limited by pain Patient left: in bed;with call bell/phone within reach;with family/visitor present     Time: 6503-5465 PT Time Calculation (min) (ACUTE ONLY): 19 min  Charges:  $Therapeutic Activity: 8-22 mins                    G Codes:      Deniece Ree PT, DPT [phone]

## 2016-12-10 ENCOUNTER — Encounter (HOSPITAL_COMMUNITY)
Admission: RE | Admit: 2016-12-10 | Discharge: 2016-12-10 | Disposition: A | Discharge status: Home / Self Care | Payer: Medicare Other | Source: Skilled Nursing Facility | Attending: Pulmonary Disease | Admitting: Pulmonary Disease

## 2016-12-10 DIAGNOSIS — R600 Localized edema: Secondary | ICD-10-CM | POA: Diagnosis not present

## 2016-12-10 DIAGNOSIS — I63312 Cerebral infarction due to thrombosis of left middle cerebral artery: Secondary | ICD-10-CM | POA: Diagnosis not present

## 2016-12-10 DIAGNOSIS — I10 Essential (primary) hypertension: Secondary | ICD-10-CM | POA: Diagnosis not present

## 2016-12-10 DIAGNOSIS — I639 Cerebral infarction, unspecified: Secondary | ICD-10-CM | POA: Diagnosis not present

## 2016-12-10 DIAGNOSIS — Z9981 Dependence on supplemental oxygen: Secondary | ICD-10-CM | POA: Diagnosis not present

## 2016-12-10 DIAGNOSIS — M1990 Unspecified osteoarthritis, unspecified site: Secondary | ICD-10-CM | POA: Diagnosis not present

## 2016-12-10 DIAGNOSIS — I5032 Chronic diastolic (congestive) heart failure: Secondary | ICD-10-CM | POA: Diagnosis not present

## 2016-12-10 DIAGNOSIS — M6281 Muscle weakness (generalized): Secondary | ICD-10-CM | POA: Insufficient documentation

## 2016-12-10 DIAGNOSIS — R279 Unspecified lack of coordination: Secondary | ICD-10-CM | POA: Diagnosis not present

## 2016-12-10 DIAGNOSIS — M25561 Pain in right knee: Secondary | ICD-10-CM | POA: Insufficient documentation

## 2016-12-10 DIAGNOSIS — J449 Chronic obstructive pulmonary disease, unspecified: Secondary | ICD-10-CM | POA: Diagnosis not present

## 2016-12-10 DIAGNOSIS — E785 Hyperlipidemia, unspecified: Secondary | ICD-10-CM | POA: Insufficient documentation

## 2016-12-10 DIAGNOSIS — I482 Chronic atrial fibrillation: Secondary | ICD-10-CM | POA: Diagnosis not present

## 2016-12-10 DIAGNOSIS — I951 Orthostatic hypotension: Secondary | ICD-10-CM | POA: Diagnosis not present

## 2016-12-10 DIAGNOSIS — J302 Other seasonal allergic rhinitis: Secondary | ICD-10-CM | POA: Diagnosis not present

## 2016-12-10 DIAGNOSIS — Z9181 History of falling: Secondary | ICD-10-CM | POA: Diagnosis not present

## 2016-12-10 DIAGNOSIS — I69320 Aphasia following cerebral infarction: Secondary | ICD-10-CM | POA: Diagnosis not present

## 2016-12-10 DIAGNOSIS — I2510 Atherosclerotic heart disease of native coronary artery without angina pectoris: Secondary | ICD-10-CM | POA: Diagnosis not present

## 2016-12-10 DIAGNOSIS — K219 Gastro-esophageal reflux disease without esophagitis: Secondary | ICD-10-CM | POA: Diagnosis not present

## 2016-12-10 DIAGNOSIS — R278 Other lack of coordination: Secondary | ICD-10-CM | POA: Diagnosis not present

## 2016-12-10 LAB — CBC
HEMATOCRIT: 43 % (ref 36.0–46.0)
Hemoglobin: 14.2 g/dL (ref 12.0–15.0)
MCH: 29 pg (ref 26.0–34.0)
MCHC: 33 g/dL (ref 30.0–36.0)
MCV: 87.8 fL (ref 78.0–100.0)
PLATELETS: 210 10*3/uL (ref 150–400)
RBC: 4.9 MIL/uL (ref 3.87–5.11)
RDW: 14.2 % (ref 11.5–15.5)
WBC: 7.4 10*3/uL (ref 4.0–10.5)

## 2016-12-10 LAB — BASIC METABOLIC PANEL
ANION GAP: 8 (ref 5–15)
BUN: 12 mg/dL (ref 6–20)
CHLORIDE: 105 mmol/L (ref 101–111)
CO2: 25 mmol/L (ref 22–32)
Calcium: 8.9 mg/dL (ref 8.9–10.3)
Creatinine, Ser: 0.55 mg/dL (ref 0.44–1.00)
GFR calc Af Amer: >60 mL/min (ref >60)
GLUCOSE: 89 mg/dL (ref 65–99)
POTASSIUM: 3.4 mmol/L — AB (ref 3.5–5.1)
Sodium: 138 mmol/L (ref 135–145)

## 2016-12-11 ENCOUNTER — Other Ambulatory Visit: Payer: Self-pay | Admitting: *Deleted

## 2016-12-11 MED ORDER — TRAMADOL HCL 50 MG PO TABS: 50.0000 mg | ORAL_TABLET | Freq: Three times a day (TID) | ORAL | 0 refills | Status: DC | PRN

## 2016-12-11 NOTE — Telephone Encounter (Signed)
[facility] #[phone] Fax: [phone]   

## 2016-12-13 ENCOUNTER — Encounter (HOSPITAL_COMMUNITY)
Admission: RE | Admit: 2016-12-13 | Discharge: 2016-12-13 | Disposition: A | Discharge status: Home / Self Care | Payer: Medicare Other | Source: Skilled Nursing Facility | Attending: Internal Medicine | Admitting: Internal Medicine

## 2016-12-13 ENCOUNTER — Encounter: Payer: Self-pay | Admitting: Internal Medicine

## 2016-12-13 ENCOUNTER — Non-Acute Institutional Stay (SKILLED_NURSING_FACILITY): Payer: Medicare Other | Admitting: Internal Medicine

## 2016-12-13 DIAGNOSIS — R600 Localized edema: Secondary | ICD-10-CM | POA: Diagnosis not present

## 2016-12-13 DIAGNOSIS — M25561 Pain in right knee: Secondary | ICD-10-CM | POA: Diagnosis not present

## 2016-12-13 DIAGNOSIS — I639 Cerebral infarction, unspecified: Secondary | ICD-10-CM

## 2016-12-13 DIAGNOSIS — I482 Chronic atrial fibrillation, unspecified: Secondary | ICD-10-CM

## 2016-12-13 DIAGNOSIS — I5032 Chronic diastolic (congestive) heart failure: Secondary | ICD-10-CM | POA: Diagnosis not present

## 2016-12-13 DIAGNOSIS — G8929 Other chronic pain: Secondary | ICD-10-CM

## 2016-12-13 LAB — BASIC METABOLIC PANEL
Anion gap: 6 (ref 5–15)
BUN: 13 mg/dL (ref 6–20)
CO2: 28 mmol/L (ref 22–32)
CREATININE: 0.69 mg/dL (ref 0.44–1.00)
Calcium: 8.9 mg/dL (ref 8.9–10.3)
Chloride: 105 mmol/L (ref 101–111)
GFR calc non Af Amer: >60 mL/min (ref >60)
Glucose, Bld: 94 mg/dL (ref 65–99)
Potassium: 3.9 mmol/L (ref 3.5–5.1)
SODIUM: 139 mmol/L (ref 135–145)

## 2016-12-13 NOTE — Progress Notes (Signed)
Provider:  Veleta Miners Location:   [place]   Room 156/W Place of Service:   SNF 31  PCP: Virgie Dad, MD Patient Care Team: Virgie Dad, MD as PCP - General (Internal Medicine)  Extended Emergency Contact Information Primary Emergency Contact: [place], [place], [place] [address] [name] of [place] Phone: [phone] Relation: Son Secondary Emergency Contact: [name], [place] [address] [name] of [place] Phone: [phone] Mobile Phone: [phone] Relation: Son  Code Status: DNR Goals of Care: Advanced Directive information Advanced Directives 12/13/2016  Does Patient Have a Medical Advance Directive? Yes  Type of Advance Directive Out of facility DNR (pink MOST or yellow form)  Does patient want to make changes to medical advance directive? No - Patient declined  Would patient like information on creating a medical advance directive? No - Patient declined  Pre-existing out of facility DNR order (yellow form or pink MOST form) -      Chief Complaint  Patient presents with  . Readmit To SNF    HPI: Patient is a 83 y.o. female seen today for Readmission to SNF for therapy.  Patient has h/o Recurrent falls, Atrial fibrillation not on any coagulation in the past due to Falls and Bleeding risk, Possible COPD, Hyperlipidemia, And Hypertension And previous MCA stroke . Patient was transferred to Hospital for Aphasia and fall. MRI showed Left MCA infarct. Patient also has High Grade stenosis of Left ICA. It seems patient was on Eliquis in past when she was diagnosed with Atrial fibrillation and was taken off it due to multiple falls and rectal bleeding. In the hospital patient was restarted on Eliquis as thinking is that she is in more controlled environment and she is at more risk of having another Stroke. She was also started on Aspirin as there is worry of her ICA stenosis. She is back in SNF for therapy.  Patient  did not have any complains . Her son though is concerned that she is not getting enough Pain meds and hurts all the time. They also are concerned about the swelling in her both legs. Also concerned worried about the knee. Patient does have hard to come up with right words.  Past Medical History:  Diagnosis Date  . Atrial fibrillation ([place])   . Breast nodule 11/15/2014  . Breast pain, left 11/15/2014  . Carotid artery disease ([place])   . Dizziness   . Dyspnea    Previous CPX suggesting possible restrictive physiology, respiratory muscle fatigue, diastolic dysfunction  . Essential hypertension, benign   . Hyperlipidemia   . Oxygen dependent    2 liter  . Rib pain on left side 11/15/2014  . Seasonal allergies   . Shingles 05/04/2015  . Toe fracture, left    left big toe   Past Surgical History:  Procedure Laterality Date  . COLONOSCOPY  02/22/2012   Procedure: COLONOSCOPY;  Surgeon: Rogene Houston, MD;  [facility];  Service: Endoscopy;  Laterality: N/A;  100  . KNEE SURGERY     bilateral    reports that she has never smoked. She has never used smokeless tobacco. She reports that she does not drink alcohol or use drugs. Social History   Social History  . Marital status: Widowed    Spouse name: N/A  . Number of children: N/A  . Years of education: N/A   Occupational History  .  Not on file.   Social History Main Topics  . Smoking status: Never Smoker  . Smokeless tobacco: Never Used  . Alcohol use No  . Drug use: No  . Sexual activity: No   Other Topics Concern  . Not on file   Social History Narrative  . No narrative on file    Functional Status Survey:    Family History  Problem Relation Age of Onset  . Stroke Mother   . Pneumonia Father   . Heart disease Father   . Healthy Son   . Glaucoma Daughter   . Emphysema Daughter   . Healthy Daughter   . Other Daughter     had knee replacement  . Healthy Son   . Heart disease Maternal Grandmother      Health Maintenance  Topic Date Due  . TETANUS/TDAP  01/16/1938  . ZOSTAVAX  01/15/1981  . DEXA SCAN  01/15/1986  . PNA vac Low Risk Adult (1 of 2 - PCV13) 01/15/1986  . INFLUENZA VACCINE  07/10/2016    Allergies  Allergen Reactions  . Sulfur Swelling    Rash also per patient    Current Outpatient Prescriptions on File Prior to Visit  Medication Sig Dispense Refill  . ANORO ELLIPTA 62.5-25 MCG/INH AEPB Inhale 1 Inhaler into the lungs daily.     Marland Kitchen apixaban (ELIQUIS) 2.5 MG TABS tablet Take 1 tablet (2.5 mg total) by mouth 2 (two) times daily. 60 tablet   . aspirin 300 MG suppository Place 1 suppository (300 mg total) rectally daily. 12 suppository 0  . atorvastatin (LIPITOR) 80 MG tablet Take 1 tablet (80 mg total) by mouth daily at 6 PM.    . cycloSPORINE (RESTASIS) 0.05 % ophthalmic emulsion Place 1 drop into both eyes every 12 (twelve) hours.     Marland Kitchen diltiazem (CARDIZEM CD) 180 MG 24 hr capsule TAKE ONE CAPSULE BY MOUTH ONCE DAILY. 30 capsule 6  . fluticasone (FLONASE) 50 MCG/ACT nasal spray Place 2 sprays into the nose daily as needed for allergies. Reported on 06/27/2016    . furosemide (LASIX) 20 MG tablet TAKE 1 TABLET BY MOUTH DAILY. 30 tablet 6  . Multiple Vitamin (MULITIVITAMIN WITH MINERALS) TABS Take 1 tablet by mouth every other day.     Marland Kitchen omeprazole (PRILOSEC) 20 MG capsule Take 20 mg by mouth daily.      . ondansetron (ZOFRAN) 4 MG tablet Take 4 mg by mouth every 6 (six) hours as needed for nausea or vomiting.    . OXYGEN Inhale 2 L into the lungs daily. Oxygen @@ 2 L/mn via qhs. Twice a day     . Polyethyl Glycol-Propyl Glycol (SYSTANE OP) Apply 1 drop to eye 3 (three) times daily. For dry eyes    . PROAIR HFA 108 (90 BASE) MCG/ACT inhaler 2 puffs 3 (three) times daily as needed for wheezing (cough).     Marland Kitchen PROCTO-MED HC 2.5 % rectal cream Place 1 application rectally daily as needed for hemorrhoids.     . traMADol (ULTRAM) 50 MG tablet Take 1 tablet (50 mg total) by  mouth every 8 (eight) hours as needed for moderate pain. 90 tablet 0  . [DISCONTINUED] Albuterol (VENTOLIN IN) Inhale into the lungs.       No current facility-administered medications on file prior to visit.      Review of Systems  Constitutional: Negative for activity change, appetite change, chills, fatigue and fever.  HENT: Positive for hearing loss. Negative for congestion, rhinorrhea  and sinus pain.   Respiratory: Negative for apnea, cough, choking, chest tightness and shortness of breath.   Cardiovascular: Positive for leg swelling. Negative for chest pain and palpitations.  Gastrointestinal: Negative for abdominal distention, abdominal pain, blood in stool, constipation, diarrhea and nausea.  Genitourinary: Negative for dysuria, flank pain and urgency.  Musculoskeletal: Positive for gait problem and joint swelling. Negative for arthralgias, back pain and myalgias.  Skin: Negative for color change, pallor and rash.  Neurological: Positive for speech difficulty. Negative for dizziness, light-headedness and numbness.  Psychiatric/Behavioral: Positive for confusion, dysphoric mood and sleep disturbance.    Vitals:   12/13/16 0941  BP: (!) 135/57  Pulse: 81  Resp: 18  Temp: 97 F (36.1 C)  TempSrc: Oral   There is no height or weight on file to calculate BMI. Physical Exam  Constitutional: She appears well-developed and well-nourished.  HENT:  Head: Normocephalic.  Mouth/Throat: Oropharynx is clear and moist.  Eyes: Pupils are equal, round, and reactive to light.  Neck: Neck supple.  Cardiovascular: Normal rate and normal heart sounds.  An irregular rhythm present.  No murmur heard. Pulmonary/Chest: Effort normal. No respiratory distress. She has no wheezes. She has rales.  Abdominal: Soft. Bowel sounds are normal. She exhibits no distension. There is no tenderness.  Musculoskeletal:  B/L Edema Left more then right. Has swelling in right Knee which has been there before.   Lymphadenopathy:    She has no cervical adenopathy.  Neurological: She is alert.  Orientation was not checked due to patient Aphasia. Has good strength in all extremities. Some weakness in Right leg due to her knee.    Labs reviewed: Basic Metabolic Panel:  Recent Labs  12/06/16 0615 12/06/16 0638 12/10/16 0710 12/13/16 0500  NA 138 141 138 139  K 3.8 3.8 3.4* 3.9  CL 106 107 105 105  CO2 22  --  25 28  GLUCOSE 98 93 89 94  BUN 17 16 12 13   CREATININE 0.71 0.70 0.55 0.69  CALCIUM 9.3  --  8.9 8.9   Liver Function Tests:  Recent Labs  08/06/16 0502 08/07/16 0609 12/06/16 0615  AST 20 19 23   ALT 17 13* 16  ALKPHOS 87 77 99  BILITOT 0.9 0.8 1.2  PROT 6.3* 5.7* 6.8  ALBUMIN 3.6 3.2* 3.8   No results for input(s): LIPASE, AMYLASE in the last 8760 hours. No results for input(s): AMMONIA in the last 8760 hours. CBC:  Recent Labs  06/27/16 1518 08/06/16 0502  11/20/16 0730 12/06/16 0615 12/06/16 0638 12/10/16 0710  WBC 7.1 8.1  < > 6.7 9.7  --  7.4  NEUTROABS 4,402 5.4  --   --  6.0  --   --   HGB 12.6 13.0  < > 12.4 14.2 14.6 14.2  HCT 38.2 39.9  < > 39.4 43.4 43.0 43.0  MCV 86.2 87.7  < > 87.6 87.1  --  87.8  PLT 256 242  < > 213 228  --  210  < > = values in this interval not displayed. Cardiac Enzymes: No results for input(s): CKTOTAL, CKMB, CKMBINDEX, TROPONINI in the last 8760 hours. BNP: Invalid input(s): POCBNP Lab Results  Component Value Date   HGBA1C 5.5 12/06/2016   No results found for: TSH No results found for: VITAMINB12 No results found for: FOLATE No results found for: IRON, TIBC, FERRITIN  Imaging and Procedures obtained prior to SNF admission: No results found.  Assessment/Plan  Acute CVA (cerebrovascular accident)  Acute Left MCA. Patient had Similar episode a year ago. Her Eliquis was stopped due to recurrent falls and GI Bleed. Since her Benefit Out weigh she is again started on Eliquis. Also on aspirin for 1 month for her  ICA stenosis. Patient son had long d/w me today and he does not seem to be interested in any surgery for his Mom. He wants to continue Eliquis for now and knows the risk involved. Patient also said the same thing. We have to continue Fall precautions. And she knows also to call for help when trying to get up. Repeat CBC to follow HGB  Chronic atrial fibrillation  Rate controlled and now on Eliquis. Continue Ditiazem  Chronic diastolic heart failure  EF 65 % continue Lasix and potassium.  Bilateral lower extremity edema Will try ted hoses thought but patient and Son reluctant to try them. Daily weights to see if she is retaining water.  Chronic pain of right knee I D/W son again . We have to continue Ultram PRN as patient does not want anything else. Patiet is not candidate for any interventions right now.  Possible depression Patient is having little difficulty adjusting to Nursing facility and would benefit from Antidepressant but right now she refuses for any New Meds.   Family/ staff Communication:   Labs/tests ordered: Daily Weight, CBC in one weak.  Total time spent in this patient care encounter was _45 minutes; greater than 50% of the visit spent counseling patient her family  and coordinating care for problems addressed at this encounter.

## 2016-12-19 ENCOUNTER — Other Ambulatory Visit: Payer: Self-pay | Admitting: *Deleted

## 2016-12-19 MED ORDER — TRAMADOL HCL 50 MG PO TABS: ORAL_TABLET | ORAL | 0 refills | Status: DC

## 2016-12-19 NOTE — Telephone Encounter (Signed)
[facility] #[phone] Fax: [phone]   

## 2016-12-24 ENCOUNTER — Encounter (HOSPITAL_COMMUNITY)
Admission: RE | Admit: 2016-12-24 | Discharge: 2016-12-24 | Disposition: A | Discharge status: Home / Self Care | Payer: Medicare Other | Source: Skilled Nursing Facility | Attending: Internal Medicine | Admitting: Internal Medicine

## 2016-12-24 LAB — CBC WITH DIFFERENTIAL/PLATELET
Basophils Absolute: 0 10*3/uL (ref 0.0–0.1)
Basophils Relative: 0 %
Eosinophils Absolute: 0.1 10*3/uL (ref 0.0–0.7)
Eosinophils Relative: 1 %
HCT: 45.3 % (ref 36.0–46.0)
HEMOGLOBIN: 14.8 g/dL (ref 12.0–15.0)
LYMPHS ABS: 2 10*3/uL (ref 0.7–4.0)
LYMPHS PCT: 35 %
MCH: 28.6 pg (ref 26.0–34.0)
MCHC: 32.7 g/dL (ref 30.0–36.0)
MCV: 87.6 fL (ref 78.0–100.0)
Monocytes Absolute: 0.7 10*3/uL (ref 0.1–1.0)
Monocytes Relative: 12 %
NEUTROS PCT: 52 %
Neutro Abs: 3 10*3/uL (ref 1.7–7.7)
PLATELETS: 239 10*3/uL (ref 150–400)
RBC: 5.17 MIL/uL — AB (ref 3.87–5.11)
RDW: 14 % (ref 11.5–15.5)
WBC: 5.7 10*3/uL (ref 4.0–10.5)

## 2016-12-24 LAB — COMPREHENSIVE METABOLIC PANEL
ALT: 16 U/L (ref 14–54)
AST: 28 U/L (ref 15–41)
Albumin: 3.7 g/dL (ref 3.5–5.0)
Alkaline Phosphatase: 101 U/L (ref 38–126)
Anion gap: 8 (ref 5–15)
BUN: 19 mg/dL (ref 6–20)
CALCIUM: 8.9 mg/dL (ref 8.9–10.3)
CHLORIDE: 104 mmol/L (ref 101–111)
CO2: 25 mmol/L (ref 22–32)
CREATININE: 0.7 mg/dL (ref 0.44–1.00)
Glucose, Bld: 96 mg/dL (ref 65–99)
Potassium: 4.2 mmol/L (ref 3.5–5.1)
SODIUM: 137 mmol/L (ref 135–145)
Total Bilirubin: 0.4 mg/dL (ref 0.3–1.2)
Total Protein: 6.7 g/dL (ref 6.5–8.1)

## 2017-01-11 ENCOUNTER — Other Ambulatory Visit: Payer: Self-pay | Admitting: *Deleted

## 2017-01-11 MED ORDER — TRAMADOL HCL 50 MG PO TABS: ORAL_TABLET | ORAL | 0 refills | Status: DC

## 2017-01-11 NOTE — Telephone Encounter (Signed)
[facility] #[phone] Fax: [phone]   

## 2017-01-23 ENCOUNTER — Non-Acute Institutional Stay (SKILLED_NURSING_FACILITY): Payer: Medicare Other | Admitting: Internal Medicine

## 2017-01-23 ENCOUNTER — Encounter: Payer: Self-pay | Admitting: Internal Medicine

## 2017-01-23 DIAGNOSIS — I10 Essential (primary) hypertension: Secondary | ICD-10-CM

## 2017-01-23 DIAGNOSIS — I639 Cerebral infarction, unspecified: Secondary | ICD-10-CM | POA: Diagnosis not present

## 2017-01-23 DIAGNOSIS — M25561 Pain in right knee: Secondary | ICD-10-CM | POA: Diagnosis not present

## 2017-01-23 DIAGNOSIS — J449 Chronic obstructive pulmonary disease, unspecified: Secondary | ICD-10-CM

## 2017-01-23 DIAGNOSIS — I482 Chronic atrial fibrillation, unspecified: Secondary | ICD-10-CM

## 2017-01-23 DIAGNOSIS — I5032 Chronic diastolic (congestive) heart failure: Secondary | ICD-10-CM

## 2017-01-23 NOTE — Progress Notes (Signed)
Location:   [facility] Room Number: 156/W Place of Service:  SNF [phone]) Provider:  Clydene Fake, MD  Patient Care Team: Virgie Dad, MD as PCP - General (Internal Medicine)  Extended Emergency Contact Information Primary Emergency Contact: [place], [place], [place] [address] [name] of [place] Phone: [phone] Relation: Son Secondary Emergency Contact: [name], [place] [address] [name] of [place] Phone: [phone] Mobile Phone: [phone] Relation: Son  Code Status:  DNR Goals of care: Advanced Directive information Advanced Directives 01/23/2017  Does Patient Have a Medical Advance Directive? Yes  Type of Advance Directive Out of facility DNR (pink MOST or yellow form)  Does patient want to make changes to medical advance directive? No - Patient declined  Would patient like information on creating a medical advance directive? No - Patient declined  Pre-existing out of facility DNR order (yellow form or pink MOST form) -     Chief Complaint  Patient presents with  . Medical Management of Chronic Issues    Routine Visit  For medical management of chronic medical conditions including history of CVA-atrial fibrillation-diastolic CHF-right knee pain-COPD-hyperlipidemia.    HPI:  Pt is a 83 y.o. female seen today for medical management of chronic diseases.  As noted above-she appears to be having a period of relative stability.  Initially came here for rehabilitation after having significant weakness at home with falls-.  Patient recently was transferred to hospital for aphasia and MRI showed a left MCA infarct.  Also high-grade stenosis of the left ICA.  She had been on Eliquis in the past when she was diagnosed with atrial fibrillation but was often cause of her multiple falls and rectal bleeding.  She isEliquis was restarted in the hospital thinking she was in a more  controlled environment with less risk of fall and now the additional risk of having another stroke.  She is also started on aspirin secondary to her ICA stenosis.  She does also have a history of chronic right knee pain and apparently a poor candidate for revision-she does not really want strong pain medication she does have tramadol when necessary also Voltaren gel.--She says she is doing relatively well with this  I do note she's had some weight loss about 15 pounds since August however her hospitalization for CVA I suspect contributed to this her weight appears to be relatively stable the past month in the low 140s she has been started on dietary supplementation including ensure.--She says she feels she eats well enough.  Today she does not really have any acute complaints is sitting in her wheelchair comfortably         Past Medical History:  Diagnosis Date  . Atrial fibrillation ([place])   . Breast nodule 11/15/2014  . Breast pain, left 11/15/2014  . Carotid artery disease ([place])   . Dizziness   . Dyspnea    Previous CPX suggesting possible restrictive physiology, respiratory muscle fatigue, diastolic dysfunction  . Essential hypertension, benign   . Hyperlipidemia   . Oxygen dependent    2 liter  . Rib pain on left side 11/15/2014  . Seasonal allergies   . Shingles 05/04/2015  . Toe fracture, left    left big toe   Past Surgical History:  Procedure Laterality Date  . COLONOSCOPY  02/22/2012   Procedure: COLONOSCOPY;  Surgeon: Rogene Houston, MD;  [facility];  Service: Endoscopy;  Laterality: N/A;  100  . KNEE SURGERY     bilateral    Allergies  Allergen Reactions  . Sulfur Swelling    Rash also per patient    Current Outpatient Prescriptions on File Prior to Visit  Medication Sig Dispense Refill  . acetaminophen (TYLENOL) 500 MG tablet Take 1,000 mg by mouth 3 (three) times daily.    Jearl Klinefelter ELLIPTA 62.5-25 MCG/INH AEPB Inhale 1 Inhaler into the lungs  daily.     Marland Kitchen apixaban (ELIQUIS) 2.5 MG TABS tablet Take 1 tablet (2.5 mg total) by mouth 2 (two) times daily. 60 tablet   . aspirin 300 MG suppository Place 1 suppository (300 mg total) rectally daily. 12 suppository 0  . atorvastatin (LIPITOR) 80 MG tablet Take 1 tablet (80 mg total) by mouth daily at 6 PM.    . cycloSPORINE (RESTASIS) 0.05 % ophthalmic emulsion Place 1 drop into both eyes every 12 (twelve) hours.     . diclofenac sodium (VOLTAREN) 1 % GEL Apply 2 g topically 2 (two) times daily.    Marland Kitchen diltiazem (CARDIZEM CD) 180 MG 24 hr capsule TAKE ONE CAPSULE BY MOUTH ONCE DAILY. 30 capsule 6  . fluticasone (FLONASE) 50 MCG/ACT nasal spray Place 2 sprays into the nose daily as needed for allergies. Reported on 06/27/2016    . furosemide (LASIX) 20 MG tablet TAKE 1 TABLET BY MOUTH DAILY. 30 tablet 6  . Multiple Vitamin (MULITIVITAMIN WITH MINERALS) TABS Take 1 tablet by mouth every other day.     Marland Kitchen omeprazole (PRILOSEC) 20 MG capsule Take 20 mg by mouth daily.      . ondansetron (ZOFRAN) 4 MG tablet Take 4 mg by mouth every 6 (six) hours as needed for nausea or vomiting.    . OXYGEN Inhale 2 L into the lungs daily. Oxygen @@ 2 L/mn via qhs. Twice a day     . Polyethyl Glycol-Propyl Glycol (SYSTANE OP) Apply 1 drop to eye 3 (three) times daily. For dry eyes    . potassium chloride (K-DUR,KLOR-CON) 10 MEQ tablet Take 20 mEq by mouth daily.    Marland Kitchen PROAIR HFA 108 (90 BASE) MCG/ACT inhaler 2 puffs 3 (three) times daily as needed for wheezing (cough).     Marland Kitchen PROCTO-MED HC 2.5 % rectal cream Place 1 application rectally daily as needed for hemorrhoids.     . traMADol (ULTRAM) 50 MG tablet Take one tablet by mouth at bedtime 10pm and Take one tablet by mouth every 6 hours as needed for pain. Hold for sedation or respiratory distress 150 tablet 0  . [DISCONTINUED] Albuterol (VENTOLIN IN) Inhale into the lungs.       No current facility-administered medications on file prior to visit.      Review of  Systems   General does not complaining fever or chills she has had some weight loss but this appears to have stabilized the past month.  Skin does not complain of rashes or itching.  Head ears eyes nose mouth and throat is positive for hearing loss is not complaining of sore throat or runny nose or visual changes from baseline.  Respiratory does not complain of shortness breath or cough does have a history of COPD.  Cardiac does not complaining of chest pain has some lower extremity edema which appears to be fairly chronic   GI is not complaining of any nausea vomiting diarrhea constipation or abdominal discomfort.  GU is not complaining of  dysuria.  Muscle skeletal does have a history of chronic right knee discomfort does appear to have chronic effusion here since the pain at this point appears to be controlled.  Neurologic does have some element of expressive aphasia does not complain of headache dizziness syncope.  Psych does not complain of overt anxiety or depression appears to be doing well with supportive care does have some history of possible depression past but this appears to be improved as she is not wanted medication for this    Immunization History  Administered Date(s) Administered  . Influenza-Unspecified 10/10/2013   Pertinent  Health Maintenance Due  Topic Date Due  . DEXA SCAN  01/15/1986  . PNA vac Low Risk Adult (1 of 2 - PCV13) 01/15/1986  . INFLUENZA VACCINE  07/10/2016   No flowsheet data found. Functional Status Survey:    Vitals:   01/23/17 1324  BP: (!) 165/74  Pulse: 64  Resp: 20  Temp: 97.6 F (36.4 C)  TempSrc: Oral  SpO2: 98%  Weight: 140 lb 12.8 oz (63.9 kg)  Height: 5\' 4"  (1.626 m)  Of note manual blood pressure was 150/70 Weight is 140 pounds Physical Exam   In general this is a pleasant elderly resident in no distress sitting comfortably in her wheelchair.  Her skin is warm and dry.  Eyes pupils appear reactive light she has  prescription lenses sclera and conjunctiva are clear-visual acuity appears grossly intact and at baseline.  Oropharynx clear mucous membranes moist.  Chest is largely clear to auscultation there is some minimal wheezing expiratory at the bases there is no labored breathing.  Heart is irregular irregular rate and rhythm she has I would say one plus lower extremity edema this is most prominent of her feet bilaterally pedal pulses are somewhat reduced I suspect secondary to the edema.  Abdomen is soft nontender with positive bowel sounds.  Extremities moves all extremities 4 continues to have some swelling in her right knee which is non-erythematous does not appear to be significantly warm this appears baseline-.  Neurologic is grossly intact her speech is clear could not really appreciate lateralizing findings.  Psych she appears grossly alert and oriented this is difficult to fully assess secondary to her expressive aphasia she was pleasant cooperative and had no difficulty following verbal commands.   Labs reviewed:  Recent Labs  12/10/16 0710 12/13/16 0500 12/24/16 0800  NA 138 139 137  K 3.4* 3.9 4.2  CL 105 105 104  CO2 25 28 25   GLUCOSE 89 94 96  BUN 12 13 19   CREATININE 0.55 0.69 0.70  CALCIUM 8.9 8.9 8.9    Recent Labs  08/07/16 0609 12/06/16 0615 12/24/16 0800  AST 19 23 28   ALT 13* 16 16  ALKPHOS 77 99 101  BILITOT 0.8 1.2 0.4  PROT 5.7* 6.8 6.7  ALBUMIN 3.2* 3.8 3.7    Recent Labs  08/06/16 0502  12/06/16 0615 12/06/16 0638 12/10/16 0710 12/24/16 0800  WBC 8.1  < > 9.7  --  7.4 5.7  NEUTROABS 5.4  --  6.0  --   --  3.0  HGB 13.0  < > 14.2 14.6 14.2 14.8  HCT 39.9  < > 43.4 43.0 43.0 45.3  MCV 87.7  < > 87.1  --  87.8 87.6  PLT 242  < > 228  --  210 239  < > = values in this interval not displayed. No results found for: TSH Lab Results  Component Value Date  HGBA1C 5.5 12/06/2016   Lab Results  Component Value Date   CHOL 96 12/07/2016    HDL 47 12/07/2016   LDLCALC 43 12/07/2016   TRIG 31 12/07/2016   CHOLHDL 2.0 12/07/2016    Significant Diagnostic Results in last 30 days:  No results found.  Assessment/Plan  #1-history of CVA-she appears to be doing well with supportive care she continues on aspirin as well as Eliquis-will update a CBC to keep an eye on her hemoglobin which appears to have been stable-.  #2 history of atrial fibrillation this appears rate controlled she is on diltiazem for rate control as well as Eliquis for anticoagulation.  #3 history of diastolic CHF-she continues on low-dose Lasix with potassium supplementation Will update a BMP to ensure stability here-she does have some edema which appears to be relatively baseline this will have to be watched.  #4 history of right knee pain-at this point appears controlled on tramadol when necessary she is also receiving Voltaren gel-note that she is not really wanted stronger pain medication.  #5 history of weight loss this appears to have stabilized somewhat she says she eats okay she has been started on supplementation at this point will monitor.  Albumin was 3.7 on lab done in mid January  #6 history of COPD she is on an Anuro ellipta as well as Provera inhaler this appears to be relatively stable she did not complain of increased shortness of breath  or cough.  #7 history of hyperlipidemia she is on atorvastatin lipid panel back in December showed an LDL of 43.  Number 8- question depression-she appears to be in good spirits today at one point she appeared apparently to have some depressive symptoms did not 1 treatment but she appears to be doing better now it appears.  #8 hypertension-she is on diltiazem-systolic blood pressure somewhat elevatedL manual exam and machine reading today-I see previous blood pressures in the [id number] systolically appears to have some variation will order manual blood pressure checks daily with a log for review next  week.  [id number] note greater than 35 minutes spent assessing patient discussing her status with nursing staff  as well as chart review-lab review-and coordinating and formulating  a plan of care for numerous diagnoses-of note greater than 50% of time spent coordinating a plan of care

## 2017-01-24 ENCOUNTER — Encounter (HOSPITAL_COMMUNITY)
Admission: RE | Admit: 2017-01-24 | Discharge: 2017-01-24 | Disposition: A | Discharge status: Home / Self Care | Payer: Medicare Other | Source: Skilled Nursing Facility | Attending: Pulmonary Disease | Admitting: Pulmonary Disease

## 2017-01-24 DIAGNOSIS — Z9981 Dependence on supplemental oxygen: Secondary | ICD-10-CM | POA: Insufficient documentation

## 2017-01-24 DIAGNOSIS — M6281 Muscle weakness (generalized): Secondary | ICD-10-CM | POA: Diagnosis not present

## 2017-01-24 DIAGNOSIS — E785 Hyperlipidemia, unspecified: Secondary | ICD-10-CM | POA: Insufficient documentation

## 2017-01-24 DIAGNOSIS — M25561 Pain in right knee: Secondary | ICD-10-CM | POA: Insufficient documentation

## 2017-01-24 LAB — BASIC METABOLIC PANEL
ANION GAP: 8 (ref 5–15)
BUN: 16 mg/dL (ref 6–20)
CALCIUM: 9.1 mg/dL (ref 8.9–10.3)
CO2: 27 mmol/L (ref 22–32)
Chloride: 104 mmol/L (ref 101–111)
Creatinine, Ser: 0.68 mg/dL (ref 0.44–1.00)
GFR calc Af Amer: >60 mL/min (ref >60)
GLUCOSE: 97 mg/dL (ref 65–99)
Potassium: 4.1 mmol/L (ref 3.5–5.1)
Sodium: 139 mmol/L (ref 135–145)

## 2017-01-24 LAB — CBC WITH DIFFERENTIAL/PLATELET
Basophils Absolute: 0 10*3/uL (ref 0.0–0.1)
Basophils Relative: 0 %
EOS PCT: 2 %
Eosinophils Absolute: 0.1 10*3/uL (ref 0.0–0.7)
HEMATOCRIT: 45 % (ref 36.0–46.0)
Hemoglobin: 14.8 g/dL (ref 12.0–15.0)
LYMPHS ABS: 2 10*3/uL (ref 0.7–4.0)
LYMPHS PCT: 27 %
MCH: 28.8 pg (ref 26.0–34.0)
MCHC: 32.9 g/dL (ref 30.0–36.0)
MCV: 87.7 fL (ref 78.0–100.0)
MONO ABS: 1 10*3/uL (ref 0.1–1.0)
MONOS PCT: 13 %
NEUTROS ABS: 4.3 10*3/uL (ref 1.7–7.7)
Neutrophils Relative %: 58 %
PLATELETS: 213 10*3/uL (ref 150–400)
RBC: 5.13 MIL/uL — ABNORMAL HIGH (ref 3.87–5.11)
RDW: 13.8 % (ref 11.5–15.5)
WBC: 7.4 10*3/uL (ref 4.0–10.5)

## 2017-01-25 DIAGNOSIS — H52223 Regular astigmatism, bilateral: Secondary | ICD-10-CM | POA: Diagnosis not present

## 2017-01-25 DIAGNOSIS — H524 Presbyopia: Secondary | ICD-10-CM | POA: Diagnosis not present

## 2017-01-25 DIAGNOSIS — H5213 Myopia, bilateral: Secondary | ICD-10-CM | POA: Diagnosis not present

## 2017-01-25 DIAGNOSIS — H33321 Round hole, right eye: Secondary | ICD-10-CM | POA: Diagnosis not present

## 2017-01-29 DIAGNOSIS — I69320 Aphasia following cerebral infarction: Secondary | ICD-10-CM | POA: Diagnosis not present

## 2017-01-29 DIAGNOSIS — I63312 Cerebral infarction due to thrombosis of left middle cerebral artery: Secondary | ICD-10-CM | POA: Diagnosis not present

## 2017-01-31 DIAGNOSIS — I63312 Cerebral infarction due to thrombosis of left middle cerebral artery: Secondary | ICD-10-CM | POA: Diagnosis not present

## 2017-01-31 DIAGNOSIS — I69320 Aphasia following cerebral infarction: Secondary | ICD-10-CM | POA: Diagnosis not present

## 2017-02-01 DIAGNOSIS — I63312 Cerebral infarction due to thrombosis of left middle cerebral artery: Secondary | ICD-10-CM | POA: Diagnosis not present

## 2017-02-01 DIAGNOSIS — I69320 Aphasia following cerebral infarction: Secondary | ICD-10-CM | POA: Diagnosis not present

## 2017-02-04 DIAGNOSIS — I69320 Aphasia following cerebral infarction: Secondary | ICD-10-CM | POA: Diagnosis not present

## 2017-02-04 DIAGNOSIS — I63312 Cerebral infarction due to thrombosis of left middle cerebral artery: Secondary | ICD-10-CM | POA: Diagnosis not present

## 2017-02-05 DIAGNOSIS — I63312 Cerebral infarction due to thrombosis of left middle cerebral artery: Secondary | ICD-10-CM | POA: Diagnosis not present

## 2017-02-05 DIAGNOSIS — I69320 Aphasia following cerebral infarction: Secondary | ICD-10-CM | POA: Diagnosis not present

## 2017-02-06 DIAGNOSIS — I63312 Cerebral infarction due to thrombosis of left middle cerebral artery: Secondary | ICD-10-CM | POA: Diagnosis not present

## 2017-02-06 DIAGNOSIS — I69320 Aphasia following cerebral infarction: Secondary | ICD-10-CM | POA: Diagnosis not present

## 2017-02-07 DIAGNOSIS — I63312 Cerebral infarction due to thrombosis of left middle cerebral artery: Secondary | ICD-10-CM | POA: Diagnosis not present

## 2017-02-07 DIAGNOSIS — I69320 Aphasia following cerebral infarction: Secondary | ICD-10-CM | POA: Diagnosis not present

## 2017-02-08 DIAGNOSIS — I63312 Cerebral infarction due to thrombosis of left middle cerebral artery: Secondary | ICD-10-CM | POA: Diagnosis not present

## 2017-02-08 DIAGNOSIS — I69320 Aphasia following cerebral infarction: Secondary | ICD-10-CM | POA: Diagnosis not present

## 2017-02-11 DIAGNOSIS — I63312 Cerebral infarction due to thrombosis of left middle cerebral artery: Secondary | ICD-10-CM | POA: Diagnosis not present

## 2017-02-11 DIAGNOSIS — I69320 Aphasia following cerebral infarction: Secondary | ICD-10-CM | POA: Diagnosis not present

## 2017-02-12 DIAGNOSIS — I63312 Cerebral infarction due to thrombosis of left middle cerebral artery: Secondary | ICD-10-CM | POA: Diagnosis not present

## 2017-02-12 DIAGNOSIS — I69320 Aphasia following cerebral infarction: Secondary | ICD-10-CM | POA: Diagnosis not present

## 2017-02-13 DIAGNOSIS — I69320 Aphasia following cerebral infarction: Secondary | ICD-10-CM | POA: Diagnosis not present

## 2017-02-13 DIAGNOSIS — I63312 Cerebral infarction due to thrombosis of left middle cerebral artery: Secondary | ICD-10-CM | POA: Diagnosis not present

## 2017-02-14 DIAGNOSIS — I69320 Aphasia following cerebral infarction: Secondary | ICD-10-CM | POA: Diagnosis not present

## 2017-02-14 DIAGNOSIS — I63312 Cerebral infarction due to thrombosis of left middle cerebral artery: Secondary | ICD-10-CM | POA: Diagnosis not present

## 2017-02-19 DIAGNOSIS — I69320 Aphasia following cerebral infarction: Secondary | ICD-10-CM | POA: Diagnosis not present

## 2017-02-19 DIAGNOSIS — I63312 Cerebral infarction due to thrombosis of left middle cerebral artery: Secondary | ICD-10-CM | POA: Diagnosis not present

## 2017-02-20 DIAGNOSIS — I69320 Aphasia following cerebral infarction: Secondary | ICD-10-CM | POA: Diagnosis not present

## 2017-02-20 DIAGNOSIS — I63312 Cerebral infarction due to thrombosis of left middle cerebral artery: Secondary | ICD-10-CM | POA: Diagnosis not present

## 2017-02-21 DIAGNOSIS — I63312 Cerebral infarction due to thrombosis of left middle cerebral artery: Secondary | ICD-10-CM | POA: Diagnosis not present

## 2017-02-21 DIAGNOSIS — I69320 Aphasia following cerebral infarction: Secondary | ICD-10-CM | POA: Diagnosis not present

## 2017-02-22 DIAGNOSIS — I63312 Cerebral infarction due to thrombosis of left middle cerebral artery: Secondary | ICD-10-CM | POA: Diagnosis not present

## 2017-02-22 DIAGNOSIS — I69320 Aphasia following cerebral infarction: Secondary | ICD-10-CM | POA: Diagnosis not present

## 2017-02-25 DIAGNOSIS — I63312 Cerebral infarction due to thrombosis of left middle cerebral artery: Secondary | ICD-10-CM | POA: Diagnosis not present

## 2017-02-25 DIAGNOSIS — I69320 Aphasia following cerebral infarction: Secondary | ICD-10-CM | POA: Diagnosis not present

## 2017-02-27 DIAGNOSIS — I69320 Aphasia following cerebral infarction: Secondary | ICD-10-CM | POA: Diagnosis not present

## 2017-02-27 DIAGNOSIS — I63312 Cerebral infarction due to thrombosis of left middle cerebral artery: Secondary | ICD-10-CM | POA: Diagnosis not present

## 2017-02-28 DIAGNOSIS — I63312 Cerebral infarction due to thrombosis of left middle cerebral artery: Secondary | ICD-10-CM | POA: Diagnosis not present

## 2017-02-28 DIAGNOSIS — I69320 Aphasia following cerebral infarction: Secondary | ICD-10-CM | POA: Diagnosis not present

## 2017-03-04 DIAGNOSIS — I63312 Cerebral infarction due to thrombosis of left middle cerebral artery: Secondary | ICD-10-CM | POA: Diagnosis not present

## 2017-03-04 DIAGNOSIS — I69320 Aphasia following cerebral infarction: Secondary | ICD-10-CM | POA: Diagnosis not present

## 2017-03-05 DIAGNOSIS — I69320 Aphasia following cerebral infarction: Secondary | ICD-10-CM | POA: Diagnosis not present

## 2017-03-05 DIAGNOSIS — I63312 Cerebral infarction due to thrombosis of left middle cerebral artery: Secondary | ICD-10-CM | POA: Diagnosis not present

## 2017-03-06 DIAGNOSIS — I63312 Cerebral infarction due to thrombosis of left middle cerebral artery: Secondary | ICD-10-CM | POA: Diagnosis not present

## 2017-03-06 DIAGNOSIS — I69320 Aphasia following cerebral infarction: Secondary | ICD-10-CM | POA: Diagnosis not present

## 2017-03-07 ENCOUNTER — Non-Acute Institutional Stay (SKILLED_NURSING_FACILITY): Payer: Medicare Other | Admitting: Internal Medicine

## 2017-03-07 ENCOUNTER — Encounter: Payer: Self-pay | Admitting: Internal Medicine

## 2017-03-07 DIAGNOSIS — G8929 Other chronic pain: Secondary | ICD-10-CM

## 2017-03-07 DIAGNOSIS — I10 Essential (primary) hypertension: Secondary | ICD-10-CM

## 2017-03-07 DIAGNOSIS — M545 Low back pain, unspecified: Secondary | ICD-10-CM

## 2017-03-07 DIAGNOSIS — M549 Dorsalgia, unspecified: Secondary | ICD-10-CM | POA: Insufficient documentation

## 2017-03-07 DIAGNOSIS — I482 Chronic atrial fibrillation, unspecified: Secondary | ICD-10-CM

## 2017-03-07 DIAGNOSIS — I639 Cerebral infarction, unspecified: Secondary | ICD-10-CM | POA: Diagnosis not present

## 2017-03-07 DIAGNOSIS — I63312 Cerebral infarction due to thrombosis of left middle cerebral artery: Secondary | ICD-10-CM | POA: Diagnosis not present

## 2017-03-07 DIAGNOSIS — I69320 Aphasia following cerebral infarction: Secondary | ICD-10-CM | POA: Diagnosis not present

## 2017-03-07 DIAGNOSIS — I6523 Occlusion and stenosis of bilateral carotid arteries: Secondary | ICD-10-CM

## 2017-03-07 DIAGNOSIS — I5032 Chronic diastolic (congestive) heart failure: Secondary | ICD-10-CM | POA: Diagnosis not present

## 2017-03-07 NOTE — Progress Notes (Addendum)
Location:   [facility] Room Number: 156/W Place of Service:  SNF [id number]) Provider:  Clydene Fake, MD  Patient Care Team: Virgie Dad, MD as PCP - General (Internal Medicine)  Extended Emergency Contact Information Primary Emergency Contact: Theodore, [place], [place] [address] [name] of [place] Phone: [phone] Relation: Son Secondary Emergency Contact: [name], [place] [address] [name] of [place] Phone: [phone] Mobile Phone: [phone] Relation: Son  Code Status:  DNR Goals of care: Advanced Directive information Advanced Directives 03/07/2017  Does Patient Have a Medical Advance Directive? Yes  Type of Advance Directive Out of facility DNR (pink MOST or yellow form)  Does patient want to make changes to medical advance directive? No - Patient declined  Would patient like information on creating a medical advance directive? No - Patient declined  Pre-existing out of facility DNR order (yellow form or pink MOST form) -     Chief Complaint  Patient presents with  . Acute Visit    Review Aspirin And C/O Back Pain.    HPI:  Pt is a 83 y.o. female seen today for an acute visit for Reviewing Her Antiplatelet Regime as pharmacy has pointed out that she is on Both Aspirin and Eliquis. Patient also need to be seen for Routine Visit. Patients son also requested his mom to be seen for Back pain.  Patient has h/o  Atrial fibrillation recently started on Eliquis, S/P Acute CVA involving left MCA ,ICA stenosis,Diastolic CHF, hyperlipidemia, And Hypertension  And Arthritis with Chronic Knee and Back Pain. Patient was on aspirin and per Neurology for 1 month after her CVA for Antiplatelet and on Eliquis for her Atrial fibrillation. Her son and her have decided not to pursue ICA surgery at this time. Patient has done well with therapy after CVA. She is wheel chair bound now. And still have some  trouble finding her words. She also has chronic Back pain mostly on the lower side radiating to the front. Mostly in the morning. Her pain is better in the night after taking Ultram. She is also c/o Some constipation. She says her stomach hurt and when she tries to pass her bowels. No nausea or vomiting. Patient weight is slightly up to 140 lbs.  Past Medical History:  Diagnosis Date  . Atrial fibrillation ([place])   . Breast nodule 11/15/2014  . Breast pain, left 11/15/2014  . Carotid artery disease ([place])   . Dizziness   . Dyspnea    Previous CPX suggesting possible restrictive physiology, respiratory muscle fatigue, diastolic dysfunction  . Essential hypertension, benign   . Hyperlipidemia   . Oxygen dependent    2 liter  . Rib pain on left side 11/15/2014  . Seasonal allergies   . Shingles 05/04/2015  . Toe fracture, left    left big toe   Past Surgical History:  Procedure Laterality Date  . COLONOSCOPY  02/22/2012   Procedure: COLONOSCOPY;  Surgeon: Rogene Houston, MD;  [facility];  Service: Endoscopy;  Laterality: N/A;  100  . KNEE SURGERY     bilateral    Allergies  Allergen Reactions  . Sulfur Swelling    Rash also per patient    Current Outpatient Prescriptions on File Prior to Visit  Medication Sig Dispense Refill  . acetaminophen (TYLENOL) 500 MG tablet Take 1,000 mg by mouth  3 (three) times daily.    Jearl Klinefelter ELLIPTA 62.5-25 MCG/INH AEPB Inhale 1 Inhaler into the lungs daily.     Marland Kitchen apixaban (ELIQUIS) 2.5 MG TABS tablet Take 1 tablet (2.5 mg total) by mouth 2 (two) times daily. 60 tablet   . atorvastatin (LIPITOR) 80 MG tablet Take 1 tablet (80 mg total) by mouth daily at 6 PM.    . cycloSPORINE (RESTASIS) 0.05 % ophthalmic emulsion Place 1 drop into both eyes every 12 (twelve) hours.     . diclofenac sodium (VOLTAREN) 1 % GEL Apply 2 g topically 2 (two) times daily.    Marland Kitchen diltiazem (CARDIZEM CD) 180 MG 24 hr capsule TAKE ONE CAPSULE BY MOUTH ONCE DAILY. 30  capsule 6  . fluticasone (FLONASE) 50 MCG/ACT nasal spray Place 2 sprays into the nose daily as needed for allergies. Reported on 06/27/2016    . furosemide (LASIX) 20 MG tablet TAKE 1 TABLET BY MOUTH DAILY. 30 tablet 6  . Multiple Vitamin (MULITIVITAMIN WITH MINERALS) TABS Take 1 tablet by mouth every other day.     Marland Kitchen omeprazole (PRILOSEC) 20 MG capsule Take 20 mg by mouth daily.      . ondansetron (ZOFRAN) 4 MG tablet Take 4 mg by mouth every 6 (six) hours as needed for nausea or vomiting.    . OXYGEN Inhale 2 L into the lungs daily. Oxygen @@ 2 L/mn via qhs. Twice a day     . Polyethyl Glycol-Propyl Glycol (SYSTANE OP) Apply 1 drop to eye 3 (three) times daily. For dry eyes    . potassium chloride (K-DUR,KLOR-CON) 10 MEQ tablet Take 20 mEq by mouth daily.    Marland Kitchen PROAIR HFA 108 (90 BASE) MCG/ACT inhaler 2 puffs 3 (three) times daily as needed for wheezing (cough).     Marland Kitchen PROCTO-MED HC 2.5 % rectal cream Place 1 application rectally daily as needed for hemorrhoids.     . traMADol (ULTRAM) 50 MG tablet Take one tablet by mouth at bedtime 10pm and Take one tablet by mouth every 6 hours as needed for pain. Hold for sedation or respiratory distress 150 tablet 0  . [DISCONTINUED] Albuterol (VENTOLIN IN) Inhale into the lungs.       No current facility-administered medications on file prior to visit.      Review of Systems  Review of Systems  Constitutional: Negative for activity change, appetite change, chills, diaphoresis, fatigue and fever.  HENT: Negative for mouth sores, postnasal drip, rhinorrhea, sinus pain and sore throat.   Respiratory: Negative for apnea, cough, chest tightness, shortness of breath and wheezing.   Cardiovascular: Negative for chest pain, palpitations and leg swelling.  Gastrointestinal: Negative for abdominal distention, abdominal pain, constipation, diarrhea, nausea and vomiting.  Genitourinary: Negative for dysuria and frequency.  Musculoskeletal: Negative for  arthralgias, joint swelling and myalgias.  Skin: Negative for rash.  Neurological: Negative for dizziness, syncope, weakness, light-headedness and numbness.  Psychiatric/Behavioral: Negative for behavioral problems, confusion and sleep disturbance.     Immunization History  Administered Date(s) Administered  . Influenza-Unspecified 10/10/2013   Pertinent  Health Maintenance Due  Topic Date Due  . DEXA SCAN  01/15/1986  . INFLUENZA VACCINE  11/09/2017 (Originally 07/10/2016)  . PNA vac Low Risk Adult (1 of 2 - PCV13) 11/09/2017 (Originally 01/15/1986)   No flowsheet data found. Functional Status Survey:    Vitals:   03/07/17 1234  BP: 130/84  Pulse: 72  Resp: 18  Temp: 97.1 F (36.2 C)  SpO2: 96%  There is no height or weight on file to calculate BMI. Physical Exam  Constitutional: She appears well-developed and well-nourished.  HENT:  Head: Normocephalic.  Mouth/Throat: Oropharynx is clear and moist.  Eyes: Pupils are equal, round, and reactive to light.  Neck: Neck supple.  Cardiovascular: Normal rate and normal heart sounds.  An irregularly irregular rhythm present.  Pulmonary/Chest: Effort normal and breath sounds normal. No respiratory distress. She has no wheezes. She has no rales.  Abdominal: Soft. Bowel sounds are normal. She exhibits no distension. There is no tenderness. There is no rebound.  Neurological: She is alert.  Continues to have weakness in both LE Has trouble coming with right words sometimes.  Skin: Skin is warm and dry.  Psychiatric: She has a normal mood and affect. Her behavior is normal. Thought content normal.    Labs reviewed:  Recent Labs  12/13/16 0500 12/24/16 0800 01/24/17 0830  NA 139 137 139  K 3.9 4.2 4.1  CL 105 104 104  CO2 28 25 27   GLUCOSE 94 96 97  BUN 13 19 16   CREATININE 0.69 0.70 0.68  CALCIUM 8.9 8.9 9.1    Recent Labs  08/07/16 0609 12/06/16 0615 12/24/16 0800  AST 19 23 28   ALT 13* 16 16  ALKPHOS 77 99  101  BILITOT 0.8 1.2 0.4  PROT 5.7* 6.8 6.7  ALBUMIN 3.2* 3.8 3.7    Recent Labs  12/06/16 0615  12/10/16 0710 12/24/16 0800 01/24/17 0830  WBC 9.7  --  7.4 5.7 7.4  NEUTROABS 6.0  --   --  3.0 4.3  HGB 14.2  < > 14.2 14.8 14.8  HCT 43.4  < > 43.0 45.3 45.0  MCV 87.1  --  87.8 87.6 87.7  PLT 228  --  210 239 213  < > = values in this interval not displayed. No results found for: TSH Lab Results  Component Value Date   HGBA1C 5.5 12/06/2016   Lab Results  Component Value Date   CHOL 96 12/07/2016   HDL 47 12/07/2016   LDLCALC 43 12/07/2016   TRIG 31 12/07/2016   CHOLHDL 2.0 12/07/2016    Significant Diagnostic Results in last 30 days:  No results found.  Assessment/Plan Acute CVA (cerebrovascular accident)   Patient doing well. She is occasionally still working with Speech therapist and doing well.d/w the therapist today Since they have opted not to do surgery for her ICA stenosis will continue her on Baby aspirin. Decreased the dose today Chronic atrial fibrillation  Doing well on Eliquis. Continue Fall precautions  Chronic diastolic heart failure  Recent weight gain. But patient doing well with no SOB or increased edema. Continue same dose of lasix and Potassium. Renal function in 02/18 was near her baseline.  Essential hypertension Continue on Cardiazem which is also for her rate controlled. Internal carotid artery stenosis, bilateral Family has decided against surgery right now.  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 Severe multifactorial stenosis at L3-4 and L4-5 that could cause neural compression. Lateral recess and foraminal stenosis at L5-S1 left worse than right that could cause neural compression. D/W Son and patient She is not candidate for extensive surgery at this time. He also said that Injecting has not helped her before. So continue pain control with  Ultram Start on Ultram Q Am.  Constipation Started on Miralax.  Family/ staff Communication:   Labs/tests ordered:  Total time spent in this patient care encounter was 45_ minutes; greater than 50% of the visit spent counseling patient and coordinating care for problems addressed at this encounter.

## 2017-03-08 DIAGNOSIS — I69320 Aphasia following cerebral infarction: Secondary | ICD-10-CM | POA: Diagnosis not present

## 2017-03-08 DIAGNOSIS — I63312 Cerebral infarction due to thrombosis of left middle cerebral artery: Secondary | ICD-10-CM | POA: Diagnosis not present

## 2017-03-11 ENCOUNTER — Other Ambulatory Visit: Payer: Self-pay | Admitting: *Deleted

## 2017-03-11 MED ORDER — TRAMADOL HCL 50 MG PO TABS: ORAL_TABLET | ORAL | 0 refills | Status: DC

## 2017-03-11 NOTE — Telephone Encounter (Signed)
[facility] #[phone] Fax: [phone]   

## 2017-03-21 ENCOUNTER — Non-Acute Institutional Stay (SKILLED_NURSING_FACILITY): Payer: Medicare Other | Admitting: Internal Medicine

## 2017-03-21 ENCOUNTER — Encounter: Payer: Self-pay | Admitting: Internal Medicine

## 2017-03-21 DIAGNOSIS — I5032 Chronic diastolic (congestive) heart failure: Secondary | ICD-10-CM | POA: Diagnosis not present

## 2017-03-21 DIAGNOSIS — I639 Cerebral infarction, unspecified: Secondary | ICD-10-CM | POA: Diagnosis not present

## 2017-03-21 DIAGNOSIS — I482 Chronic atrial fibrillation, unspecified: Secondary | ICD-10-CM

## 2017-03-21 DIAGNOSIS — I10 Essential (primary) hypertension: Secondary | ICD-10-CM | POA: Diagnosis not present

## 2017-03-21 NOTE — Progress Notes (Signed)
Location:   [facility] Room Number: 156/W Place of Service:  SNF [id number]) Provider:  Clydene Fake, MD  Patient Care Team: Virgie Dad, MD as PCP - General (Internal Medicine) Wille Celeste, PA-C as Physician Assistant (Internal Medicine)  Extended Emergency Contact Information Primary Emergency Contact: Adin Hector, [place] [address] [name] of [place] Phone: [phone] Relation: Son Secondary Emergency Contact: [name], [place] [address] [name] of [place] Phone: [phone] Mobile Phone: [phone] Relation: Son  Code Status:  DNR Goals of care: Advanced Directive information Advanced Directives 03/21/2017  Does Patient Have a Medical Advance Directive? Yes  Type of Advance Directive Out of facility DNR (pink MOST or yellow form)  Does patient want to make changes to medical advance directive? No - Patient declined  Would patient like information on creating a medical advance directive? No - Patient declined  Pre-existing out of facility DNR order (yellow form or pink MOST form) -     Chief Complaint  Patient presents with  . Medical Management of Chronic Issues    Routine Visit    HPI:  Pt is a 83 y.o. female seen today for medical management of chronic diseases.     Patient has h/o  Atrial fibrillation recently started on Eliquis, S/P Acute CVA involving left MCA ,ICA stenosis,Diastolic CHF, hyperlipidemia, And Hypertension  And Arthritis with Chronic Knee and Back Pain.  Patient was seen today for routine visit though her son was in the room and had multiple concerned. He was mainily concerned about her LE edema bilateral. It seems that some ted hose were ordered but patient does n't like them. Patient does have increased weight from last month of almost 5 pounds but she is back to her weight few months ago. She denies any Cough or SOB. No Chest pain.  Her pain is better  since she was started on Ultram in morning. Her constipation is resolved with Miralax. Her appetite is still not good as she does not like her food.   Past Medical History:  Diagnosis Date  . Atrial fibrillation ([place])   . Breast nodule 11/15/2014  . Breast pain, left 11/15/2014  . Carotid artery disease ([place] [place])   . Dizziness   . Dyspnea    Previous CPX suggesting possible restrictive physiology, respiratory muscle fatigue, diastolic dysfunction  . Essential hypertension, benign   . Hyperlipidemia   . Oxygen dependent    2 liter  . Rib pain on left side 11/15/2014  . Seasonal allergies   . Shingles 05/04/2015  . Toe fracture, left    left big toe   Past Surgical History:  Procedure Laterality Date  . COLONOSCOPY  02/22/2012   Procedure: COLONOSCOPY;  Surgeon: Rogene Houston, MD;  [facility];  Service: Endoscopy;  Laterality: N/A;  100  . KNEE SURGERY     bilateral    Allergies  Allergen Reactions  . Sulfur Swelling    Rash also per patient    Outpatient Encounter Prescriptions as of 03/21/2017  Medication Sig  . acetaminophen (TYLENOL) 500 MG tablet Take 1,000 mg by mouth 3 (three) times daily.  Jearl Klinefelter ELLIPTA 62.5-25 MCG/INH AEPB Inhale 1 Inhaler into the lungs daily.   Marland Kitchen apixaban (ELIQUIS) 2.5 MG TABS tablet Take 1 tablet (2.5 mg total) by mouth 2 (two) times daily.  Marland Kitchen aspirin 325  MG tablet Take 325 mg by mouth daily.  Marland Kitchen atorvastatin (LIPITOR) 80 MG tablet Take 1 tablet (80 mg total) by mouth daily at 6 PM.  . cycloSPORINE (RESTASIS) 0.05 % ophthalmic emulsion Place 1 drop into both eyes every 12 (twelve) hours.   . diclofenac sodium (VOLTAREN) 1 % GEL Apply 2 g topically 2 (two) times daily.  Marland Kitchen diltiazem (CARDIZEM CD) 180 MG 24 hr capsule TAKE ONE CAPSULE BY MOUTH ONCE DAILY.  . furosemide (LASIX) 20 MG tablet TAKE 1 TABLET BY MOUTH DAILY.  . Multiple Vitamin (MULITIVITAMIN WITH MINERALS) TABS Take 1 tablet by mouth every other day.   Marland Kitchen omeprazole (PRILOSEC)  20 MG capsule Take 20 mg by mouth daily.    . OXYGEN Inhale 2 L into the lungs daily. Oxygen @@ 2 L/mn via qhs. Twice a day   . Polyethyl Glycol-Propyl Glycol (SYSTANE OP) Place 1 drop into both eyes 3 (three) times daily. For dry eyes  . polyethylene glycol (MIRALAX / GLYCOLAX) packet Take 17 g by mouth daily.  . potassium chloride (K-DUR,KLOR-CON) 10 MEQ tablet Take 20 mEq by mouth daily.  Marland Kitchen PROAIR HFA 108 (90 BASE) MCG/ACT inhaler 2 puffs 3 (three) times daily as needed for wheezing (cough).   Marland Kitchen PROCTO-MED HC 2.5 % rectal cream Place 1 application rectally daily as needed for hemorrhoids.   . traMADol (ULTRAM) 50 MG tablet Give 50 mg by mouth once a morning. Take one tablet by mouth at bedtime 10pm and Take one tablet by mouth every 6 hours as needed for pain. Hold for sedation or respiratory distress  . [DISCONTINUED] fluticasone (FLONASE) 50 MCG/ACT nasal spray Place 2 sprays into the nose daily as needed for allergies. Reported on 06/27/2016  . [DISCONTINUED] ondansetron (ZOFRAN) 4 MG tablet Take 4 mg by mouth every 6 (six) hours as needed for nausea or vomiting.  . [DISCONTINUED] traMADol (ULTRAM) 50 MG tablet Take one tablet by mouth at bedtime 10pm and Take one tablet by mouth every 6 hours as needed for pain. Hold for sedation or respiratory distress (Patient taking differently: Give 50 mg by mouth once a morning. Take one tablet by mouth at bedtime 10pm and Take one tablet by mouth every 6 hours as needed for pain. Hold for sedation or respiratory distress)   No facility-administered encounter medications on file as of 03/21/2017.      Review of Systems  Constitutional: Negative.  Negative for activity change and appetite change.  HENT: Negative.   Respiratory: Negative.   Cardiovascular: Positive for leg swelling.  Gastrointestinal: Negative.   Genitourinary: Negative.   Musculoskeletal: Positive for arthralgias, back pain and myalgias.  Skin: Negative.   Psychiatric/Behavioral:  Negative.     Immunization History  Administered Date(s) Administered  . Influenza-Unspecified 10/10/2013   Pertinent  Health Maintenance Due  Topic Date Due  . INFLUENZA VACCINE  11/09/2017 (Originally 07/10/2017)  . DEXA SCAN  11/09/2017 (Originally 01/15/1986)  . PNA vac Low Risk Adult (1 of 2 - PCV13) 11/09/2017 (Originally 01/15/1986)   No flowsheet data found. Functional Status Survey:    Vitals:   03/21/17 1223  BP: (!) 145/71  Pulse: 66  Resp: 18  Temp: 97.9 F (36.6 C)  TempSrc: Oral   There is no height or weight on file to calculate BMI. Physical Exam  Constitutional: She is oriented to person, place, and time. She appears well-developed and well-nourished.  HENT:  Head: Normocephalic.  Mouth/Throat: Oropharynx is clear and moist.  Eyes: Pupils are  equal, round, and reactive to light.  Neck: Neck supple.  Cardiovascular: Normal rate and normal heart sounds.  An irregular rhythm present.  Pulmonary/Chest: Effort normal and breath sounds normal. No respiratory distress. She has no wheezes. She has no rales.  Abdominal: Soft. Bowel sounds are normal. She exhibits no distension. There is no tenderness. There is no rebound.  Musculoskeletal:  Patient has moderate Edema in both LE Her small wound on Right leg is already healing.  Neurological: She is alert and oriented to person, place, and time.  Has Aphasia but continues to do well.  Skin: Skin is warm and dry.  Psychiatric: She has a normal mood and affect. Her behavior is normal. Thought content normal.    Labs reviewed:  Recent Labs  12/13/16 0500 12/24/16 0800 01/24/17 0830  NA 139 137 139  K 3.9 4.2 4.1  CL 105 104 104  CO2 28 25 27   GLUCOSE 94 96 97  BUN 13 19 16   CREATININE 0.69 0.70 0.68  CALCIUM 8.9 8.9 9.1    Recent Labs  08/07/16 0609 12/06/16 0615 12/24/16 0800  AST 19 23 28   ALT 13* 16 16  ALKPHOS 77 99 101  BILITOT 0.8 1.2 0.4  PROT 5.7* 6.8 6.7  ALBUMIN 3.2* 3.8 3.7    Recent  Labs  12/06/16 0615  12/10/16 0710 12/24/16 0800 01/24/17 0830  WBC 9.7  --  7.4 5.7 7.4  NEUTROABS 6.0  --   --  3.0 4.3  HGB 14.2  < > 14.2 14.8 14.8  HCT 43.4  < > 43.0 45.3 45.0  MCV 87.1  --  87.8 87.6 87.7  PLT 228  --  210 239 213  < > = values in this interval not displayed. No results found for: TSH Lab Results  Component Value Date   HGBA1C 5.5 12/06/2016   Lab Results  Component Value Date   CHOL 96 12/07/2016   HDL 47 12/07/2016   LDLCALC 43 12/07/2016   TRIG 31 12/07/2016   CHOLHDL 2.0 12/07/2016    Significant Diagnostic Results in last 30 days:  No results found.  Assessment/Plan Chronic diastolic heart failure  With worsening Lower extremity edema and increased weight will increase her dose of Lasix to 40 mg alternate with 20 mg. Repeat BMP in few days. Continue Daily weights.   Chronic atrial fibrillation  Rate controlled on Cardizem Tolerating Eliquis   Acute CVA (cerebrovascular accident)  Continues to have improvement of her speech On Baby aspirin  Essential hypertension  Mildly high But continue same meds for now  Chronic Back pian and knee pain Doing well on ultram     Family/ staff Communication:   Labs/tests ordered:

## 2017-03-25 ENCOUNTER — Encounter (HOSPITAL_COMMUNITY)
Admission: RE | Admit: 2017-03-25 | Discharge: 2017-03-25 | Disposition: A | Discharge status: Home / Self Care | Payer: Medicare Other | Source: Skilled Nursing Facility | Attending: Pulmonary Disease | Admitting: Pulmonary Disease

## 2017-03-25 DIAGNOSIS — Z9981 Dependence on supplemental oxygen: Secondary | ICD-10-CM | POA: Diagnosis not present

## 2017-03-25 DIAGNOSIS — M6281 Muscle weakness (generalized): Secondary | ICD-10-CM | POA: Insufficient documentation

## 2017-03-25 DIAGNOSIS — E785 Hyperlipidemia, unspecified: Secondary | ICD-10-CM | POA: Diagnosis not present

## 2017-03-25 DIAGNOSIS — M25561 Pain in right knee: Secondary | ICD-10-CM | POA: Insufficient documentation

## 2017-03-25 LAB — BASIC METABOLIC PANEL
Anion gap: 8 (ref 5–15)
BUN: 20 mg/dL (ref 6–20)
CHLORIDE: 102 mmol/L (ref 101–111)
CO2: 28 mmol/L (ref 22–32)
CREATININE: 0.73 mg/dL (ref 0.44–1.00)
Calcium: 8.8 mg/dL — ABNORMAL LOW (ref 8.9–10.3)
GFR calc Af Amer: >60 mL/min (ref >60)
GFR calc non Af Amer: >60 mL/min (ref >60)
GLUCOSE: 101 mg/dL — AB (ref 65–99)
POTASSIUM: 3.6 mmol/L (ref 3.5–5.1)
SODIUM: 138 mmol/L (ref 135–145)

## 2017-04-02 DIAGNOSIS — Z7951 Long term (current) use of inhaled steroids: Secondary | ICD-10-CM | POA: Diagnosis not present

## 2017-04-02 DIAGNOSIS — Z961 Presence of intraocular lens: Secondary | ICD-10-CM | POA: Diagnosis not present

## 2017-04-02 DIAGNOSIS — Z7901 Long term (current) use of anticoagulants: Secondary | ICD-10-CM | POA: Diagnosis not present

## 2017-04-02 DIAGNOSIS — H04123 Dry eye syndrome of bilateral lacrimal glands: Secondary | ICD-10-CM | POA: Diagnosis not present

## 2017-04-16 DIAGNOSIS — M1990 Unspecified osteoarthritis, unspecified site: Secondary | ICD-10-CM | POA: Diagnosis not present

## 2017-04-16 DIAGNOSIS — R600 Localized edema: Secondary | ICD-10-CM | POA: Diagnosis not present

## 2017-04-16 DIAGNOSIS — B351 Tinea unguium: Secondary | ICD-10-CM | POA: Diagnosis not present

## 2017-04-16 DIAGNOSIS — I739 Peripheral vascular disease, unspecified: Secondary | ICD-10-CM | POA: Diagnosis not present

## 2017-04-29 DIAGNOSIS — M25511 Pain in right shoulder: Secondary | ICD-10-CM | POA: Diagnosis not present

## 2017-05-07 ENCOUNTER — Encounter: Payer: Self-pay | Admitting: Internal Medicine

## 2017-05-07 ENCOUNTER — Non-Acute Institutional Stay (SKILLED_NURSING_FACILITY): Payer: Medicare Other | Admitting: Internal Medicine

## 2017-05-07 DIAGNOSIS — I639 Cerebral infarction, unspecified: Secondary | ICD-10-CM | POA: Diagnosis not present

## 2017-05-07 DIAGNOSIS — I5032 Chronic diastolic (congestive) heart failure: Secondary | ICD-10-CM | POA: Diagnosis not present

## 2017-05-07 DIAGNOSIS — I482 Chronic atrial fibrillation, unspecified: Secondary | ICD-10-CM

## 2017-05-07 DIAGNOSIS — J449 Chronic obstructive pulmonary disease, unspecified: Secondary | ICD-10-CM

## 2017-05-07 DIAGNOSIS — R4701 Aphasia: Secondary | ICD-10-CM | POA: Diagnosis not present

## 2017-05-07 DIAGNOSIS — I6523 Occlusion and stenosis of bilateral carotid arteries: Secondary | ICD-10-CM | POA: Diagnosis not present

## 2017-05-07 NOTE — Progress Notes (Signed)
Location:   [facility] Room Number: 156/W Place of Service:  SNF [phone]) Provider:  Gardiner Fanti, Rene Kocher, MD  Patient Care Team: Virgie Dad, MD as PCP - General (Internal Medicine) Rolm Baptise as Physician Assistant (Internal Medicine)  Extended Emergency Contact Information Primary Emergency Contact: Alishah, [place], [place] [address] [name] of [place] Phone: [phone] Relation: Son Secondary Emergency Contact: [name], [place] [address] [name] of [place] Phone: [phone] Mobile Phone: [phone] Relation: Son  Code Status:  DNR Goals of care: Advanced Directive information Advanced Directives 05/07/2017  Does Patient Have a Medical Advance Directive? Yes  Type of Advance Directive Out of facility DNR (pink MOST or yellow form)  Does patient want to make changes to medical advance directive? No - Patient declined  Would patient like information on creating a medical advance directive? No - Patient declined  Pre-existing out of facility DNR order (yellow form or pink MOST form) -     Chief Complaint  Patient presents with  . Medical Management of Chronic Issues    Routine Visit    HPI:  Pt is a 83 y.o. female seen today for medical management of chronic diseases.     Past Medical History:  Diagnosis Date  . Atrial fibrillation ([place])   . Breast nodule 11/15/2014  . Breast pain, left 11/15/2014  . Carotid artery disease ([place])   . Dizziness   . Dyspnea    Previous CPX suggesting possible restrictive physiology, respiratory muscle fatigue, diastolic dysfunction  . Essential hypertension, benign   . Hyperlipidemia   . Oxygen dependent    2 liter  . Rib pain on left side 11/15/2014  . Seasonal allergies   . Shingles 05/04/2015  . Toe fracture, left    left big toe   Past Surgical History:  Procedure Laterality Date  . COLONOSCOPY  02/22/2012   Procedure: COLONOSCOPY;   Surgeon: Rogene Houston, MD;  [facility];  Service: Endoscopy;  Laterality: N/A;  100  . KNEE SURGERY     bilateral    Allergies  Allergen Reactions  . Sulfur Swelling    Rash also per patient    Outpatient Encounter Prescriptions as of 05/07/2017  Medication Sig  . acetaminophen (TYLENOL) 500 MG tablet Take 1,000 mg by mouth 3 (three) times daily.  Jearl Klinefelter ELLIPTA 62.5-25 MCG/INH AEPB Inhale 1 Inhaler into the lungs daily.   Marland Kitchen apixaban (ELIQUIS) 2.5 MG TABS tablet Take 1 tablet (2.5 mg total) by mouth 2 (two) times daily.  Marland Kitchen aspirin 81 MG chewable tablet Chew 81 mg by mouth daily.  Marland Kitchen atorvastatin (LIPITOR) 80 MG tablet Take 1 tablet (80 mg total) by mouth daily at 6 PM.  . cycloSPORINE (RESTASIS) 0.05 % ophthalmic emulsion Place 1 drop into both eyes every 12 (twelve) hours.   . diclofenac sodium (VOLTAREN) 1 % GEL Apply 2 g topically 2 (two) times daily.  Marland Kitchen diltiazem (CARDIZEM CD) 180 MG 24 hr capsule TAKE ONE CAPSULE BY MOUTH ONCE DAILY.  . furosemide (LASIX) 20 MG tablet Take 20 mg by mouth every other day. Alternate  With 40 mg tablet  . furosemide (LASIX) 40 MG tablet Take 40 mg by mouth every other day. Alternate with 20 mg tablet  . Multiple Vitamin (MULITIVITAMIN WITH MINERALS) TABS Take 1 tablet by mouth every other day.   Marland Kitchen  omeprazole (PRILOSEC) 20 MG capsule Take 20 mg by mouth daily.    . OXYGEN Inhale 2 L into the lungs daily. Oxygen @@ 2 L/mn via qhs. Twice a day   . Polyethyl Glycol-Propyl Glycol (SYSTANE OP) Place 1 drop into both eyes 3 (three) times daily. For dry eyes  . polyethylene glycol (MIRALAX / GLYCOLAX) packet Take 17 g by mouth daily.  . potassium chloride (K-DUR,KLOR-CON) 10 MEQ tablet Take 20 mEq by mouth daily.  Marland Kitchen PROAIR HFA 108 (90 BASE) MCG/ACT inhaler 2 puffs 3 (three) times daily as needed for wheezing (cough).   Marland Kitchen PROCTO-MED HC 2.5 % rectal cream Place 1 application rectally daily as needed for hemorrhoids.   . promethazine (PHENERGAN)  12.5 MG tablet Take 12.5 mg by mouth every 6 (six) hours as needed for nausea or vomiting.  . traMADol (ULTRAM) 50 MG tablet Give 50 mg by mouth once a morning. Take one tablet by mouth at bedtime 10pm and Take one tablet by mouth every 6 hours as needed for pain. Hold for sedation or respiratory distress  . [DISCONTINUED] aspirin 325 MG tablet Take 325 mg by mouth daily.  . [DISCONTINUED] furosemide (LASIX) 20 MG tablet TAKE 1 TABLET BY MOUTH DAILY.   No facility-administered encounter medications on file as of 05/07/2017.      Review of Systems  Immunization History  Administered Date(s) Administered  . Influenza-Unspecified 10/10/2013   Pertinent  Health Maintenance Due  Topic Date Due  . INFLUENZA VACCINE  11/09/2017 (Originally 07/10/2017)  . DEXA SCAN  11/09/2017 (Originally 01/15/1986)  . PNA vac Low Risk Adult (1 of 2 - PCV13) 11/09/2017 (Originally 01/15/1986)   No flowsheet data found. Functional Status Survey:    There were no vitals filed for this visit. There is no height or weight on file to calculate BMI. Physical Exam  Labs reviewed:  Recent Labs  12/24/16 0800 01/24/17 0830 03/25/17 1100  NA 137 139 138  K 4.2 4.1 3.6  CL 104 104 102  CO2 25 27 28   GLUCOSE 96 97 101*  BUN 19 16 20   CREATININE 0.70 0.68 0.73  CALCIUM 8.9 9.1 8.8*    Recent Labs  08/07/16 0609 12/06/16 0615 12/24/16 0800  AST 19 23 28   ALT 13* 16 16  ALKPHOS 77 99 101  BILITOT 0.8 1.2 0.4  PROT 5.7* 6.8 6.7  ALBUMIN 3.2* 3.8 3.7    Recent Labs  12/06/16 0615  12/10/16 0710 12/24/16 0800 01/24/17 0830  WBC 9.7  --  7.4 5.7 7.4  NEUTROABS 6.0  --   --  3.0 4.3  HGB 14.2  < > 14.2 14.8 14.8  HCT 43.4  < > 43.0 45.3 45.0  MCV 87.1  --  87.8 87.6 87.7  PLT 228  --  210 239 213  < > = values in this interval not displayed. No results found for: TSH Lab Results  Component Value Date   HGBA1C 5.5 12/06/2016   Lab Results  Component Value Date   CHOL 96 12/07/2016   HDL  47 12/07/2016   LDLCALC 43 12/07/2016   TRIG 31 12/07/2016   CHOLHDL 2.0 12/07/2016    Significant Diagnostic Results in last 30 days:  No results found.  Assessment/Plan There are no diagnoses linked to this encounter.   Family/ staff Communication:   Labs/tests ordered:       This encounter was created in error - please disregard.

## 2017-05-07 NOTE — Progress Notes (Signed)
Location:   [place] [place] Room Number: 156/W Place of Service:  SNF [phone]) Provider:  Gardiner Fanti, Rene Kocher, MD  Patient Care Team: Virgie Dad, MD as PCP - General (Internal Medicine) Rolm Baptise as Physician Assistant (Internal Medicine)  Extended Emergency Contact Information Primary Emergency Contact: Annalynne, [place], [place] [address] [name] of [place] Phone: [phone] Relation: Son Secondary Emergency Contact: [name], [place] [address] [name] of [place] Phone: [phone] Mobile Phone: [phone] Relation: Son  Code Status:  DNR Goals of care: Advanced Directive information Advanced Directives 05/07/2017  Does Patient Have a Medical Advance Directive? Yes  Type of Advance Directive Out of facility DNR (pink MOST or yellow form)  Does patient want to make changes to medical advance directive? No - Patient declined  Would patient like information on creating a medical advance directive? No - Patient declined  Pre-existing out of facility DNR order (yellow form or pink MOST form) -     Chief Complaint  Patient presents with  . Medical Management of Chronic Issues    Routine Visit   Medical management of chronic medical issues including history of CVA-atrial fibrillation-diastolic CHF-hyperlipidemia-hypertension-constipation-chronic back pain.   HPI:  Pt is a 83 y.o. female seen today for medical management of chronic diseases.  As noted above. Most recently acute issue was possible increased edema and weight gain and her Lasix was slightly increased weight appears to have moderated at around the 143 pounds-she is not complaining of any increased shortness of breath or increased edema. She does have history of diastolic CHF  She does have a history of CVA but has done well with supportive care her speech appears to be clear than originally when she came here she is bright and  alert a very pleasant lady still has some element of expressive aphasia. She is on aspirin as well as a statin she's also on Eliquis with a history CVA  Regards to atrial fibrillation she continues on Cardizem as well as Eliquis. This appears to be rate controlled.  She does have a history of chronic back pain does have tramadol ordered a she also receives Voltaren gel which she says helps quite a bit nd apparently this is helping to give her some relief  Apparently staff noticed this morning some apparent bruising on her left upper arm question history of trauma she is not complaining of any pain with this this will have to be monitored.  She also says she has a hemorrhoid and would like something to help for comfort     Past Medical History:  Diagnosis Date  . Atrial fibrillation ([place])   . Breast nodule 11/15/2014  . Breast pain, left 11/15/2014  . Carotid artery disease ([place])   . Dizziness   . Dyspnea    Previous CPX suggesting possible restrictive physiology, respiratory muscle fatigue, diastolic dysfunction  . Essential hypertension, benign   . Hyperlipidemia   . Oxygen dependent    2 liter  . Rib pain on left side 11/15/2014  . Seasonal allergies   . Shingles 05/04/2015  . Toe fracture, left    left big toe   Past Surgical History:  Procedure Laterality Date  . COLONOSCOPY  02/22/2012   Procedure: COLONOSCOPY;  Surgeon: Rogene Houston, MD;  [facility];  Service: Endoscopy;  Laterality: N/A;  100  .  KNEE SURGERY     bilateral    Allergies  Allergen Reactions  . Sulfur Swelling    Rash also per patient    Outpatient Encounter Prescriptions as of 05/07/2017  Medication Sig  . acetaminophen (TYLENOL) 500 MG tablet Take 1,000 mg by mouth 3 (three) times daily.  Jearl Klinefelter ELLIPTA 62.5-25 MCG/INH AEPB Inhale 1 Inhaler into the lungs daily.   Marland Kitchen apixaban (ELIQUIS) 2.5 MG TABS tablet Take 1 tablet (2.5 mg total) by mouth 2 (two) times daily.  Marland Kitchen aspirin 81 MG  chewable tablet Chew 81 mg by mouth daily.  Marland Kitchen atorvastatin (LIPITOR) 80 MG tablet Take 1 tablet (80 mg total) by mouth daily at 6 PM.  . cycloSPORINE (RESTASIS) 0.05 % ophthalmic emulsion Place 1 drop into both eyes every 12 (twelve) hours.   . diclofenac sodium (VOLTAREN) 1 % GEL Apply 2 g topically 2 (two) times daily.  Marland Kitchen diltiazem (CARDIZEM CD) 180 MG 24 hr capsule TAKE ONE CAPSULE BY MOUTH ONCE DAILY.  . furosemide (LASIX) 20 MG tablet Take 20 mg by mouth every other day. Alternate  With 40 mg tablet  . furosemide (LASIX) 40 MG tablet Take 40 mg by mouth every other day. Alternate with 20 mg tablet  . Multiple Vitamin (MULITIVITAMIN WITH MINERALS) TABS Take 1 tablet by mouth every other day.   Marland Kitchen omeprazole (PRILOSEC) 20 MG capsule Take 20 mg by mouth daily.    . OXYGEN Inhale 2 L into the lungs daily. Oxygen @@ 2 L/mn via qhs. Twice a day   . Polyethyl Glycol-Propyl Glycol (SYSTANE OP) Place 1 drop into both eyes 3 (three) times daily. For dry eyes  . polyethylene glycol (MIRALAX / GLYCOLAX) packet Take 17 g by mouth daily.  . potassium chloride (K-DUR,KLOR-CON) 10 MEQ tablet Take 20 mEq by mouth daily.  Marland Kitchen PROAIR HFA 108 (90 BASE) MCG/ACT inhaler 2 puffs 3 (three) times daily as needed for wheezing (cough).   Marland Kitchen PROCTO-MED HC 2.5 % rectal cream Place 1 application rectally daily as needed for hemorrhoids.   . promethazine (PHENERGAN) 12.5 MG tablet Take 12.5 mg by mouth every 6 (six) hours as needed for nausea or vomiting.  . traMADol (ULTRAM) 50 MG tablet Give 50 mg by mouth once a morning. Take one tablet by mouth at bedtime 10pm and Take one tablet by mouth every 6 hours as needed for pain. Hold for sedation or respiratory distress   No facility-administered encounter medications on file as of 05/07/2017.      Review of Systems General does not complain fever or chills--8 appears to be stabilized  Skin does have apparently fairly new bruise-on her left upper arm she does not  complaining of any pain or itching with this  Head ears eyes nose mouth and throat is positive for hearing loss is not complaining of sore throat or runny nose or visual changes from baseline.  Respiratory does not complain of shortness breath or cough does have a history of COPD.  Cardiac does not complaining of chest pain has some lower extremity edema which appears to be fairly chronic and stable at this point she has compression hose on  GI is not complaining of any nausea vomiting diarrhea constipation or abdominal discomfort.  Rectal does complain of hemorrhoid discomfort during bowel movements  GU is not complaining of dysuria.  Muscle skeletal does have a history of chronic pain more so her back--knee  but this appears to be well controlled  Neurologic does have  some element of expressive aphasia does not complain of headache dizziness syncope.-aphasia appears to be improving  Psych does not complain of overt anxiety or depression appears to be doing well with supportive care does have some history of possible depression past but this appears to be improved as she is not wanted medication for this--  Immunization History  Administered Date(s) Administered  . Influenza-Unspecified 10/10/2013   Pertinent  Health Maintenance Due  Topic Date Due  . INFLUENZA VACCINE  11/09/2017 (Originally 07/10/2017)  . DEXA SCAN  11/09/2017 (Originally 01/15/1986)  . PNA vac Low Risk Adult (1 of 2 - PCV13) 11/09/2017 (Originally 01/15/1986)   No flowsheet data found. Functional Status Survey:    Vitals:   05/07/17 1621  BP: 121/79  Pulse: 82  Resp: 20  Temp: 97.8 F (36.6 C)  TempSrc: Oral  SpO2: 98%  Weight: 143 lb 14.4 oz (65.3 kg)  Height: 5\' 4"  (1.626 m)   Body mass index is 24.7 kg/m. Physical Exam  In general this is a pleasant elderly resident in no distress sitting comfortably in her wheelchair.  Her skin is warm and dry.--She has what appears to be a bruise on  the anterior portion of her mid left arm it does not extend to the back of her arm it is nontender non-firm non-warm  Eyes pupils appear reactive light she has prescription lenses sclera and conjunctiva are clear-visual acuity appears grossly intact and at baseline.  Oropharynx clear mucous membranes moist.  Chest is clear to auscultation there is no labored breathing  Heart is irregular irregular rate and rhythm she has I one plus lower extremity edema -compression hose are applied bilaterally  Abdomen is soft nontender with positive bowel sounds.  Rectal-she does have an external hemorrhoid protruding I do not see any active bleeding there is tenderness to palpation of the area  Extremities moves all extremities 4 she is able to stand without assistance but is quite weak--.  Neurologic is grossly intact her speech is clear could not really appreciate lateralizing findings. Her expressive aphasia persists but appears to have gradually improved she can be quite conversant  Psych she appears grossly alert and oriented pleasant and appropriate continues instantly about me not working and will continue to    Labs reviewed:  Recent Labs  12/24/16 0800 01/24/17 0830 03/25/17 1100  NA 137 139 138  K 4.2 4.1 3.6  CL 104 104 102  CO2 25 27 28   GLUCOSE 96 97 101*  BUN 19 16 20   CREATININE 0.70 0.68 0.73  CALCIUM 8.9 9.1 8.8*    Recent Labs  08/07/16 0609 12/06/16 0615 12/24/16 0800  AST 19 23 28   ALT 13* 16 16  ALKPHOS 77 99 101  BILITOT 0.8 1.2 0.4  PROT 5.7* 6.8 6.7  ALBUMIN 3.2* 3.8 3.7    Recent Labs  12/06/16 0615  12/10/16 0710 12/24/16 0800 01/24/17 0830  WBC 9.7  --  7.4 5.7 7.4  NEUTROABS 6.0  --   --  3.0 4.3  HGB 14.2  < > 14.2 14.8 14.8  HCT 43.4  < > 43.0 45.3 45.0  MCV 87.1  --  87.8 87.6 87.7  PLT 228  --  210 239 213  < > = values in this interval not displayed. No results found for: TSH Lab Results  Component Value Date   HGBA1C  5.5 12/06/2016   Lab Results  Component Value Date   CHOL 96 12/07/2016   HDL 47 12/07/2016  LDLCALC 43 12/07/2016   TRIG 31 12/07/2016   CHOLHDL 2.0 12/07/2016    Significant Diagnostic Results in last 30 days:  No results found.  Assessment/Plan  :  History of CVA left MCA-she is on aspirin 81 mg a day as well as a statin LDL was 43 on lab done December 2018-still has some expressive aphasia but appears to be improving.  She is also on Eliquis secondary to history of A. fib.  #2 history of atrial fibrillation this appears rate controlled on Cardizem she is on Eliquis for anticoagulation.  # 3 history of diastolic CHF she continues on Lasix alternating doses of 40 and 20 mg a day-her weight appears to be stabilized as well as edema she is on potassium supplementation this appears stable we will update a metabolic panel keep an eye on her renal function and electrolytes.  4 history of ICA stenosis-at this point family does not wish surgery-aggressive follow-up-- clinically she appears to be stable continues on anticoagulation.  #5-history of back and knee pain this appears stable now with tramadol routinely twice a day as well as every 6 hours when necessary also receives Voltaren gel.  #6 history of constipation this appears stable on current medications including MiraLAX once a day.  #7 history of hemorrhoid discomfort I do see she has an order for proctoscop med daily as needed this will have to be encouraged will monitor for resolution here if pain continues consider other options.  #8 history of COPD this appears stable on pro-air as well as  Anoro Ellipta--this is not really been in issue during her stay here to my knowledge   #9 history of hypertension this appears stable on Cardizem recent blood pressures 121/79-119/64  #10 history of suspected left upper arm bruise-at this point will monitor she is not having any discomfort with this I do not note any deformity or  tenderness to palpation this will have to be watched however and will update lab work tomorrow to check on her platelet level   [id number]-of note greater than 35 minutes spent assessing patient-reviewing her chart review her labs discussing her status with nursing staff-and coordinating formulating a plan of care for numerous diagnoses-note greater than 50% of time spent coordinating plan of care

## 2017-05-08 ENCOUNTER — Encounter (HOSPITAL_COMMUNITY)
Admission: RE | Admit: 2017-05-08 | Discharge: 2017-05-08 | Disposition: A | Discharge status: Home / Self Care | Payer: Medicare Other | Source: Skilled Nursing Facility | Attending: Pulmonary Disease | Admitting: Pulmonary Disease

## 2017-05-08 ENCOUNTER — Non-Acute Institutional Stay (SKILLED_NURSING_FACILITY): Payer: Medicare Other | Admitting: Internal Medicine

## 2017-05-08 DIAGNOSIS — I482 Chronic atrial fibrillation, unspecified: Secondary | ICD-10-CM

## 2017-05-08 DIAGNOSIS — Z9981 Dependence on supplemental oxygen: Secondary | ICD-10-CM | POA: Diagnosis not present

## 2017-05-08 DIAGNOSIS — K625 Hemorrhage of anus and rectum: Secondary | ICD-10-CM

## 2017-05-08 DIAGNOSIS — I639 Cerebral infarction, unspecified: Secondary | ICD-10-CM | POA: Diagnosis not present

## 2017-05-08 DIAGNOSIS — M6281 Muscle weakness (generalized): Secondary | ICD-10-CM | POA: Insufficient documentation

## 2017-05-08 DIAGNOSIS — E785 Hyperlipidemia, unspecified: Secondary | ICD-10-CM | POA: Insufficient documentation

## 2017-05-08 DIAGNOSIS — M25561 Pain in right knee: Secondary | ICD-10-CM | POA: Diagnosis not present

## 2017-05-08 LAB — BASIC METABOLIC PANEL
Anion gap: 9 (ref 5–15)
BUN: 23 mg/dL — ABNORMAL HIGH (ref 6–20)
CO2: 26 mmol/L (ref 22–32)
Calcium: 8.8 mg/dL — ABNORMAL LOW (ref 8.9–10.3)
Chloride: 102 mmol/L (ref 101–111)
Creatinine, Ser: 0.72 mg/dL (ref 0.44–1.00)
GFR calc Af Amer: >60 mL/min (ref >60)
GFR calc non Af Amer: >60 mL/min (ref >60)
GLUCOSE: 96 mg/dL (ref 65–99)
Potassium: 4.5 mmol/L (ref 3.5–5.1)
Sodium: 137 mmol/L (ref 135–145)

## 2017-05-08 LAB — CBC WITH DIFFERENTIAL/PLATELET
Basophils Absolute: 0 10*3/uL (ref 0.0–0.1)
Basophils Relative: 0 %
Eosinophils Absolute: 0.2 10*3/uL (ref 0.0–0.7)
Eosinophils Relative: 2 %
HEMATOCRIT: 41 % (ref 36.0–46.0)
Hemoglobin: 13.5 g/dL (ref 12.0–15.0)
Lymphocytes Relative: 30 %
Lymphs Abs: 2.8 10*3/uL (ref 0.7–4.0)
MCH: 28.8 pg (ref 26.0–34.0)
MCHC: 32.9 g/dL (ref 30.0–36.0)
MCV: 87.6 fL (ref 78.0–100.0)
Monocytes Absolute: 0.9 10*3/uL (ref 0.1–1.0)
Monocytes Relative: 10 %
NEUTROS ABS: 5.4 10*3/uL (ref 1.7–7.7)
Neutrophils Relative %: 58 %
Platelets: 227 10*3/uL (ref 150–400)
RBC: 4.68 MIL/uL (ref 3.87–5.11)
RDW: 14.4 % (ref 11.5–15.5)
WBC: 9.3 10*3/uL (ref 4.0–10.5)

## 2017-05-08 NOTE — Progress Notes (Signed)
This is an acute visit.  Level care skilled.  Facility is [facility].  Chief complaint-acute visit secondary to rectal bleeding    History of present illness.  Patient is a pleasant 83 year old female seen today for follow-up of rectal bleeding.  She was actually seen yesterday for routine visit and did complain of having a hemorrhoid which is apparently fairly chronic.--Apparently at times has been bleeding with this according to nursing staff and patient.  Apparently earlier today she stated she had a bowel movement and had some rectal bleeding she says possibly a bit more than usual and I'm following up on this  Labs done today actually showed relative stability her hemoglobin is 13.5 baseline appears to be in the 14 range so there has been a slight drop here but not precipitous. Her platelets are within normal range at 227.  Metabolic panel shows relative stability with a creatinine of 0.72 and BUN of 23 is his baseline with what it was about a month ago.  She is on Eliquis 2.5 mg twice a day with history of-Afib  she is also on low-dose aspirin 81 mg daily a history of ICA stenosis as well as CVA.  I note she is also on a PPI Prilosec  Per chart review I see she did have a colonoscopy back in 2013 by Dr.Rehmanapparently this was done secondary to rectal bleeding   It did show pancolonic diverticulosis also a small cecal AV malformation.  It also showed a healed ulceration thought secondary to NSAID S.  She also had external hemorrhoid  Apparently she also had some rectal bleeding back in July 2017 and did see GI-at that point she guaiaced negative and they did do stool cards at home which apparently were negative 3--.  Hemoglobin apparently was stable at 12.6 when it was done in July  She is not complaining of any abdominal discomfort nausea or vomiting she is resting bed comfortably vital signs are stable her blood pressure somewhat elevated with a systolic  [id number] appears her blood pressure somewhat lower will at this point monitor she has been a bit excited   Past Medical History:  Diagnosis Date  . Atrial fibrillation ([place])   . Breast nodule 11/15/2014  . Breast pain, left 11/15/2014  . Carotid artery disease ([place])   . Dizziness   . Dyspnea    Previous CPX suggesting possible restrictive physiology, respiratory muscle fatigue, diastolic dysfunction  . Essential hypertension, benign   . Hyperlipidemia   . Oxygen dependent    2 liter  . Rib pain on left side 11/15/2014  . Seasonal allergies   . Shingles 05/04/2015  . Toe fracture, left    left big toe        Past Surgical History:  Procedure Laterality Date  . COLONOSCOPY  02/22/2012   Procedure: COLONOSCOPY;  Surgeon: Rogene Houston, MD;  Location: [facility];  Service: Endoscopy;  Laterality: N/A;  100  . KNEE SURGERY     bilateral         Allergies  Allergen Reactions  . Sulfur Swelling    Rash also per patient        Outpatient Encounter Prescriptions as of 05/07/2017  Medication Sig  . acetaminophen (TYLENOL) 500 MG tablet Take 1,000 mg by mouth 3 (three) times daily.  Jearl Klinefelter ELLIPTA 62.5-25 MCG/INH AEPB Inhale 1 Inhaler into the lungs daily.   Marland Kitchen apixaban (ELIQUIS) 2.5 MG TABS tablet Take 1 tablet (2.5 mg total) by  mouth 2 (two) times daily.  Marland Kitchen aspirin 81 MG chewable tablet Chew 81 mg by mouth daily.  Marland Kitchen atorvastatin (LIPITOR) 80 MG tablet Take 1 tablet (80 mg total) by mouth daily at 6 PM.  . cycloSPORINE (RESTASIS) 0.05 % ophthalmic emulsion Place 1 drop into both eyes every 12 (twelve) hours.   . diclofenac sodium (VOLTAREN) 1 % GEL Apply 2 g topically 2 (two) times daily.  Marland Kitchen diltiazem (CARDIZEM CD) 180 MG 24 hr capsule TAKE ONE CAPSULE BY MOUTH ONCE DAILY.  . furosemide (LASIX) 20 MG tablet Take 20 mg by mouth every other day. Alternate  With 40 mg tablet  . furosemide (LASIX) 40 MG tablet Take 40 mg by mouth every other day.  Alternate with 20 mg tablet  . Multiple Vitamin (MULITIVITAMIN WITH MINERALS) TABS Take 1 tablet by mouth every other day.   Marland Kitchen omeprazole (PRILOSEC) 20 MG capsule Take 20 mg by mouth daily.    . OXYGEN Inhale 2 L into the lungs daily. Oxygen @@ 2 L/mn via qhs. Twice a day   . Polyethyl Glycol-Propyl Glycol (SYSTANE OP) Place 1 drop into both eyes 3 (three) times daily. For dry eyes  . polyethylene glycol (MIRALAX / GLYCOLAX) packet Take 17 g by mouth daily.  . potassium chloride (K-DUR,KLOR-CON) 10 MEQ tablet Take 20 mEq by mouth daily.  Marland Kitchen PROAIR HFA 108 (90 BASE) MCG/ACT inhaler 2 puffs 3 (three) times daily as needed for wheezing (cough).   Marland Kitchen PROCTO-MED HC 2.5 % rectal cream Place 1 application rectally daily as needed for hemorrhoids.   . promethazine (PHENERGAN) 12.5 MG tablet Take 12.5 mg by mouth every 6 (six) hours as needed for nausea or vomiting.  . traMADol (ULTRAM) 50 MG tablet Give 50 mg by mouth once a morning. Take one tablet by mouth at bedtime 10pm and Take one tablet by mouth every 6 hours as needed for pain. Hold for sedation or respiratory distress   No facility-administered encounter medications on file as of 05/07/2017.      Review of Systems General does not complain fever or chills-  Skin does have apparently fairly new bruise-on her Left upper arm this does not appear to be grossly changed compared to yesterday  Head ears eyes nose mouth and throat is positive for hearing loss is not complaining of sore throat or runny nose or visual changes from baseline.  Respiratory does not complain of shortness breath or cough does have a history of COPD.  Cardiac does not complaining of chest pain has some lower extremity edema which appears to be fairly chronic and stable at this point she has compression hose on  GI is not complaining of any nausea vomiting diarrhea constipation or abdominal discomfort. Has apparently had some rectal bleeding earlier today does have  a history of hemorrhoids-apparently the bleeding occurred while having a bowel movement-extensiveness of the  bleeding however is somewhat sketchy  Rectal does complain of hemorrhoid discomfort during bowel movements  GU is not complaining of dysuria.  Muscle skeletal does have a history of chronic pain more so her back--knee  but this appears to be well controlled  Neurologic does have some element of expressive aphasia does not complain of headache dizziness syncope.-aphasia appears to be improving  Psych does not complain of overt anxiety or depression appears to be doing well with supportive care        Immunization History  Administered Date(s) Administered  . Influenza-Unspecified 10/10/2013  Pertinent  Health Maintenance Due  Topic Date Due  . INFLUENZA VACCINE  11/09/2017 (Originally 07/10/2017)  . DEXA SCAN  11/09/2017 (Originally 01/15/1986)  . PNA vac Low Risk Adult (1 of 2 - PCV13) 11/09/2017 (Originally 01/15/1986)   No flowsheet data found. Functional Status Survey:     Vitals:                               She is afebrile pulse 64 respirations of 17 and blood pressure taken manually 150/76 Physical Exam  In general this is a pleasant elderly resident in no distress lying comfortably in bed.  Her skin is warm and dry.--She has what appears to be a bruise on the anterior portion of her mid left arm it does not extend to the back of her arm it is nontender non-firm non-warm--if anything somewhat improved from yesterday's presentation  Eyes pupils appear reactive light she has prescription lenses sclera and conjunctiva are clear-visual acuity appears grossly intact and at baseline.  Oropharynx clear mucous membranes moist.  Chest is clear to auscultation there is no labored breathing  Heart is irregular irregular rate and rhythm she has Ione plus lower extremity edema -compression hose are applied bilaterally  Abdomen is soft  nontender with positive bowel sounds.  Rectal-she does have an external hemorrhoid protruding I do not see any active bleeding  I didn test for occult blood testing per digital exam and it did test positive in fact visibly had some dried blood  Extremities moves all extremities 4 with generalized frailty.  Neurologic is grossly intact her speech is clear could not really appreciate lateralizing findings. Her expressive aphasia persists but appears to have gradually improved   Psych she appears grossly alert and oriented pleasant and appropriate    Labs reviewed:  05/08/2017.  WBC 9.3 hemoglobin 13.5 platelets 227.  Sodium 137 potassium 4.5 BUN 23 creatinine 0.72  RecentLabs(withinlast365days)   Recent Labs  12/24/16 0800 01/24/17 0830 03/25/17 1100  NA 137 139 138  K 4.2 4.1 3.6  CL 104 104 102  CO2 25 27 28   GLUCOSE 96 97 101*  BUN 19 16 20   CREATININE 0.70 0.68 0.73  CALCIUM 8.9 9.1 8.8*      RecentLabs(withinlast365days)   Recent Labs  08/07/16 0609 12/06/16 0615 12/24/16 0800  AST 19 23 28   ALT 13* 16 16  ALKPHOS 77 99 101  BILITOT 0.8 1.2 0.4  PROT 5.7* 6.8 6.7  ALBUMIN 3.2* 3.8 3.7      RecentLabs(withinlast365days)   Recent Labs  12/06/16 0615  12/10/16 0710 12/24/16 0800 01/24/17 0830  WBC 9.7  --  7.4 5.7 7.4  NEUTROABS 6.0  --   --  3.0 4.3  HGB 14.2  < > 14.2 14.8 14.8  HCT 43.4  < > 43.0 45.3 45.0  MCV 87.1  --  87.8 87.6 87.7  PLT 228  --  210 239 213  < > = values in this interval not displayed.   RecentLabs  No results found for: TSH   RecentLabs       Lab Results  Component Value Date   HGBA1C 5.5 12/06/2016     RecentLabs    Assessment and plan.  #1 history of suspected rectal bleeding-as noted above she does have some history of this and has a history of hemorrhoid she does have a topical preparation prescribed for the hemorrhoid.  Unsure of bleeding today during  bowel  movement was related to bleeding hemorrhoid or possibly something else.  Her hemoglobin is reassuring at 13.5.  Clinically is not complaining of any abdominal pain or appears in any discomfort   She continues on low-dose aspirin with a history of ICA stenosis--CVA-continues on low-dose Eliquis 2.5 mg twice a day with her history of atrial fibrillation- I did discuss this with Dr. Lyndel Safe via phone at this point will monitor and update a CBC tomorrow-.  Also monitor closely vital signs every shift 4 hours for now.  If she has any significant rectal bleeding she will need to go to the ER  She already is on a proton pump inhibitor.    I did discuss her status with her son Clance Boll this evening who is in the room-he states she has had some history rectal bleeding the past but never really in essence amount to much what he can remember.  I did discuss the plan with him-and did state that she has any significant rectal bleeding she would probably need to go to the ER and he expressed understanding.  At this point again will monitor closely with vital signs and update lab work tomorrow Also will write an order to [facility]  stools 3.  [id number] note greater than 45 minutes spent assessing patient-reassessing patient-discussing her status with her son-as well as with nursing staff-reviewing her chart-consult notes-and coordinating plan of care-of note greater than 50% of time spent coordinating plan of care with input as noted above

## 2017-05-09 ENCOUNTER — Encounter: Payer: Self-pay | Admitting: Internal Medicine

## 2017-05-09 ENCOUNTER — Non-Acute Institutional Stay (SKILLED_NURSING_FACILITY): Payer: Medicare Other | Admitting: Internal Medicine

## 2017-05-09 ENCOUNTER — Encounter (HOSPITAL_COMMUNITY)
Admission: RE | Admit: 2017-05-09 | Discharge: 2017-05-09 | Disposition: A | Discharge status: Home / Self Care | Payer: Medicare Other | Source: Skilled Nursing Facility | Attending: Internal Medicine | Admitting: Internal Medicine

## 2017-05-09 DIAGNOSIS — I69320 Aphasia following cerebral infarction: Secondary | ICD-10-CM | POA: Diagnosis not present

## 2017-05-09 DIAGNOSIS — M1990 Unspecified osteoarthritis, unspecified site: Secondary | ICD-10-CM | POA: Diagnosis not present

## 2017-05-09 DIAGNOSIS — K625 Hemorrhage of anus and rectum: Secondary | ICD-10-CM

## 2017-05-09 DIAGNOSIS — E785 Hyperlipidemia, unspecified: Secondary | ICD-10-CM | POA: Insufficient documentation

## 2017-05-09 DIAGNOSIS — I63312 Cerebral infarction due to thrombosis of left middle cerebral artery: Secondary | ICD-10-CM | POA: Diagnosis not present

## 2017-05-09 DIAGNOSIS — Z5189 Encounter for other specified aftercare: Secondary | ICD-10-CM | POA: Insufficient documentation

## 2017-05-09 LAB — CBC WITH DIFFERENTIAL/PLATELET
BASOS PCT: 0 %
Basophils Absolute: 0 10*3/uL (ref 0.0–0.1)
Eosinophils Absolute: 0.1 10*3/uL (ref 0.0–0.7)
Eosinophils Relative: 1 %
HCT: 39.9 % (ref 36.0–46.0)
HEMOGLOBIN: 12.9 g/dL (ref 12.0–15.0)
Lymphocytes Relative: 24 %
Lymphs Abs: 2.3 10*3/uL (ref 0.7–4.0)
MCH: 28.4 pg (ref 26.0–34.0)
MCHC: 32.3 g/dL (ref 30.0–36.0)
MCV: 87.7 fL (ref 78.0–100.0)
MONOS PCT: 8 %
Monocytes Absolute: 0.7 10*3/uL (ref 0.1–1.0)
NEUTROS PCT: 67 %
Neutro Abs: 6.2 10*3/uL (ref 1.7–7.7)
Platelets: 230 10*3/uL (ref 150–400)
RBC: 4.55 MIL/uL (ref 3.87–5.11)
RDW: 14.4 % (ref 11.5–15.5)
WBC: 9.3 10*3/uL (ref 4.0–10.5)

## 2017-05-09 LAB — COMPREHENSIVE METABOLIC PANEL
ALBUMIN: 3.6 g/dL (ref 3.5–5.0)
ALK PHOS: 122 U/L (ref 38–126)
ALT: 16 U/L (ref 14–54)
ANION GAP: 9 (ref 5–15)
AST: 23 U/L (ref 15–41)
BILIRUBIN TOTAL: 0.8 mg/dL (ref 0.3–1.2)
BUN: 27 mg/dL — AB (ref 6–20)
CALCIUM: 8.9 mg/dL (ref 8.9–10.3)
CO2: 25 mmol/L (ref 22–32)
CREATININE: 0.78 mg/dL (ref 0.44–1.00)
Chloride: 103 mmol/L (ref 101–111)
GFR calc Af Amer: >60 mL/min (ref >60)
GFR calc non Af Amer: >60 mL/min (ref >60)
GLUCOSE: 95 mg/dL (ref 65–99)
Potassium: 4.2 mmol/L (ref 3.5–5.1)
SODIUM: 137 mmol/L (ref 135–145)
Total Protein: 6.7 g/dL (ref 6.5–8.1)

## 2017-05-09 LAB — OCCULT BLOOD X 1 CARD TO LAB, STOOL: FECAL OCCULT BLD: POSITIVE — AB

## 2017-05-09 NOTE — Progress Notes (Signed)
Location:   [facility] Room Number: 156/W Place of Service:  SNF [phone]) Provider:  Freddi Starr, MD  Patient Care Team: Virgie Dad, MD as PCP - General (Internal Medicine) Rolm Baptise as Physician Assistant (Internal Medicine)  Extended Emergency Contact Information Primary Emergency Contact: Adin Hector, [place] [address] [name] of [place] Phone: [phone] Relation: Son Secondary Emergency Contact: [name],  [address] [name] of [place] Phone: [phone] Mobile Phone: [phone] Relation: Son  Code Status:  DNR Goals of care: Advanced Directive information Advanced Directives 05/09/2017  Does Patient Have a Medical Advance Directive? Yes  Type of Advance Directive Out of facility DNR (pink MOST or yellow form)  Does patient want to make changes to medical advance directive? No - Patient declined  Would patient like information on creating a medical advance directive? No - Patient declined  Pre-existing out of facility DNR order (yellow form or pink MOST form) -     Chief complaint-acute visit follow-up rectal bleeding  HPI:  Pt is a 83 y.o. female seen today for an acute visit for  Follow-up rectal bleeding.  She was seen yesterday for apparent episode of rectal bleeding-this complicated with a history of hemorrhoids as well which apparently occasionally bleed.  Apparently she had another episode this morning and according nursing staff it was not a gross amount but significant enough to notice--  Per researched and yesterday she did have a colonoscopy back in 2013 secondary to rectal bleeding that showed pancolonic diverticulosis also a small cecal AV malformation as well as a healed ulceration thousand secondary to NSAIDs.  Apparently past summer of 2017 she also had an episode of rectal bleeding but subsequent stool testing was negative    hemoglobin  appear to be stable.  We did do update lab work today which shows relative stability of her hemoglobin at 12.9--previous hemoglobins have been 13.5 and in the 14 range.  She is bright alert up sitting in her chair has no complaints today denies any abdominal pain or increased weakness dizziness or syncope-says she feels fine.  Vital signs are stable.  She is on a proton pump inhibitor-she is also on Eliquis 2.5 mg twice a day with a history of atrial fibrillation.--She also has ICA stenosis as well as a CVA in continues on low-dose aspirin.          Past Medical History:  Diagnosis Date  . Atrial fibrillation ([place])   . Breast nodule 11/15/2014  . Breast pain, left 11/15/2014  . Carotid artery disease ([place])   . Dizziness   . Dyspnea    Previous CPX suggesting possible restrictive physiology, respiratory muscle fatigue, diastolic dysfunction  . Essential hypertension, benign   . Hyperlipidemia   . Oxygen dependent    2 liter  . Rib pain on left side 11/15/2014  . Seasonal allergies   . Shingles 05/04/2015  . Toe fracture, left    left big toe   Past Surgical History:  Procedure Laterality Date  . COLONOSCOPY  02/22/2012   Procedure: COLONOSCOPY;  Surgeon: Rogene Houston, MD;  [facility]: [facility];  Service: Endoscopy;  Laterality: N/A;  100  . KNEE SURGERY     bilateral    Allergies  Allergen Reactions  . Sulfur Swelling    Rash also per patient    Outpatient  Encounter Prescriptions as of 05/09/2017  Medication Sig  . acetaminophen (TYLENOL) 500 MG tablet Take 1,000 mg by mouth 3 (three) times daily.  Jearl Klinefelter ELLIPTA 62.5-25 MCG/INH AEPB Inhale 1 Inhaler into the lungs daily.   Marland Kitchen apixaban (ELIQUIS) 2.5 MG TABS tablet Take 1 tablet (2.5 mg total) by mouth 2 (two) times daily.  Marland Kitchen aspirin 81 MG chewable tablet Chew 81 mg by mouth daily.  Marland Kitchen atorvastatin (LIPITOR) 80 MG tablet Take 1 tablet (80 mg total) by mouth daily at 6 PM.  . cycloSPORINE (RESTASIS) 0.05 %  ophthalmic emulsion Place 1 drop into both eyes every 12 (twelve) hours.   . diclofenac sodium (VOLTAREN) 1 % GEL Apply 2 g topically 2 (two) times daily.  Marland Kitchen diltiazem (CARDIZEM CD) 180 MG 24 hr capsule TAKE ONE CAPSULE BY MOUTH ONCE DAILY.  . furosemide (LASIX) 20 MG tablet Take 20 mg by mouth every other day. Alternate  With 40 mg tablet  . furosemide (LASIX) 40 MG tablet Take 40 mg by mouth every other day. Alternate with 20 mg tablet  . Multiple Vitamin (MULITIVITAMIN WITH MINERALS) TABS Take 1 tablet by mouth every other day.   Marland Kitchen omeprazole (PRILOSEC) 20 MG capsule Take 20 mg by mouth daily.    . OXYGEN Inhale 2 L into the lungs daily. Oxygen @@ 2 L/mn via qhs. Twice a day   . Polyethyl Glycol-Propyl Glycol (SYSTANE OP) Place 1 drop into both eyes 3 (three) times daily. For dry eyes  . polyethylene glycol (MIRALAX / GLYCOLAX) packet Take 17 g by mouth daily.  . potassium chloride (K-DUR,KLOR-CON) 10 MEQ tablet Take 20 mEq by mouth daily.  Marland Kitchen PROAIR HFA 108 (90 BASE) MCG/ACT inhaler 2 puffs 3 (three) times daily as needed for wheezing (cough).   Marland Kitchen PROCTO-MED HC 2.5 % rectal cream Place 1 application rectally daily as needed for hemorrhoids.   . promethazine (PHENERGAN) 12.5 MG tablet Take 12.5 mg by mouth every 6 (six) hours as needed for nausea or vomiting.  . traMADol (ULTRAM) 50 MG tablet Give 50 mg by mouth once a morning. Take one tablet by mouth at bedtime 10pm and Take one tablet by mouth every 6 hours as needed for pain. Hold for sedation or respiratory distress   No facility-administered encounter medications on file as of 05/09/2017.     Review of Systems General does not complain fever or chills-  Skin doeshave apparently fairly new bruise-on her Left upper arm this does not appear to be grossly changed  Head ears eyes nose mouth and throat is positive for hearing loss is not complaining of sore throat or runny nose or visual changes from baseline.  Respiratory does not  complain of shortness breath or cough does have a history of COPD.  Cardiac does not complaining of chest pain has some lower extremity edema which appears to be fairly chronic and stable at this point she has compression hose on  GI is not complaining of any nausea vomiting diarrhea constipation or abdominal discomfort. Has apparently had some rectal bleeding earlier today during a BM  Rectal does complain of hemorrhoid discomfort during bowel movements--she is receiving topical treatment  GU is not complaining of dysuria.  Muscle skeletal does have a history of chronic pain more so her back--knee but this appears to be well controlled  Neurologic does have some element of expressive aphasia does not complain of headache dizziness syncope.-aphasiaappears to be improving  Psych does not complain of overt anxiety or  depression appears to be doing well with supportive care  Immunization History  Administered Date(s) Administered  . Influenza-Unspecified 10/10/2013   Pertinent  Health Maintenance Due  Topic Date Due  . INFLUENZA VACCINE  11/09/2017 (Originally 07/10/2017)  . DEXA SCAN  11/09/2017 (Originally 01/15/1986)  . PNA vac Low Risk Adult (1 of 2 - PCV13) 11/09/2017 (Originally 01/15/1986)   No flowsheet data found. Functional Status Survey:    Vitals:   05/09/17 1611  BP: 120/66  Pulse: 80  Resp: 18  Temp: 98.3 F (36.8 C)  TempSrc: Oral    Physical Exam In general this is a pleasant elderly resident in no distress sitting comfortably in her wheelchair  Her skin is warm and dry.--She has what appears to be a bruise on the anterior portion of her mid left arm it does not extend to the back of her arm it is nontender non-firm non-warm--appears to be slowly resolving   Eyes pupils appear reactive light she has prescription lenses sclera and conjunctiva are clear-visual acuity appears grossly intact and at baseline.  Oropharynx clear mucous membranes  moist.  Chest is clear to auscultation there is no labored breathing  Heart is irregular irregular rate and rhythm she has one plus lower extremity edema -compression hose are applied bilaterally  Abdomen is soft nontender with positive bowel sounds.     MS- moves all extremities 4 with generalized frailty.  Neurologic is grossly intact her speech is clear could not really appreciate lateralizing findings. Her expressive aphasia persists but appears to have gradually improved   Psych she appears grossly alert and oriented pleasant and appropriate low   Labs reviewed:  Recent Labs  03/25/17 1100 05/08/17 0730 05/09/17 0720  NA 138 137 137  K 3.6 4.5 4.2  CL 102 102 103  CO2 28 26 25   GLUCOSE 101* 96 95  BUN 20 23* 27*  CREATININE 0.73 0.72 0.78  CALCIUM 8.8* 8.8* 8.9    Recent Labs  12/06/16 0615 12/24/16 0800 05/09/17 0720  AST 23 28 23   ALT 16 16 16   ALKPHOS 99 101 122  BILITOT 1.2 0.4 0.8  PROT 6.8 6.7 6.7  ALBUMIN 3.8 3.7 3.6    Recent Labs  01/24/17 0830 05/08/17 0730 05/09/17 0720  WBC 7.4 9.3 9.3  NEUTROABS 4.3 5.4 6.2  HGB 14.8 13.5 12.9  HCT 45.0 41.0 39.9  MCV 87.7 87.6 87.7  PLT 213 227 230   No results found for: TSH Lab Results  Component Value Date   HGBA1C 5.5 12/06/2016   Lab Results  Component Value Date   CHOL 96 12/07/2016   HDL 47 12/07/2016   LDLCALC 43 12/07/2016   TRIG 31 12/07/2016   CHOLHDL 2.0 12/07/2016    Significant Diagnostic Results in last 30 days:  No results found.  Assessment/Plan  #1 history of suspected rectal bleeding-she did have another episode this morning although apparently this was not gross rectal bleeding again this may be more hemorrhoid related.  She is receiving treatment for that.  Hemoglobin appears to be relatively stable at 12.9 today it is a slight decrease but not precipitous by any manner-clinically she appears to be feeling well and is asymptomatic.  At this point will  await further labs tomorrow to see if there is a true trend  of the hemoglobin trending down-she continues on a proton pump inhibitor as well  Again will await lab results tomorrow for any possible follow-up   [id number]

## 2017-05-10 ENCOUNTER — Encounter (HOSPITAL_COMMUNITY)
Admission: RE | Admit: 2017-05-10 | Discharge: 2017-05-10 | Disposition: A | Discharge status: Home / Self Care | Payer: Medicare Other | Source: Skilled Nursing Facility | Attending: Internal Medicine | Admitting: Internal Medicine

## 2017-05-10 DIAGNOSIS — I10 Essential (primary) hypertension: Secondary | ICD-10-CM | POA: Diagnosis not present

## 2017-05-10 DIAGNOSIS — I69320 Aphasia following cerebral infarction: Secondary | ICD-10-CM | POA: Insufficient documentation

## 2017-05-10 DIAGNOSIS — Z5189 Encounter for other specified aftercare: Secondary | ICD-10-CM | POA: Insufficient documentation

## 2017-05-10 DIAGNOSIS — I63312 Cerebral infarction due to thrombosis of left middle cerebral artery: Secondary | ICD-10-CM | POA: Insufficient documentation

## 2017-05-10 DIAGNOSIS — M1990 Unspecified osteoarthritis, unspecified site: Secondary | ICD-10-CM | POA: Diagnosis not present

## 2017-05-10 LAB — CBC
HEMATOCRIT: 36.6 % (ref 36.0–46.0)
HEMOGLOBIN: 11.9 g/dL — AB (ref 12.0–15.0)
MCH: 28.5 pg (ref 26.0–34.0)
MCHC: 32.5 g/dL (ref 30.0–36.0)
MCV: 87.6 fL (ref 78.0–100.0)
Platelets: 199 10*3/uL (ref 150–400)
RBC: 4.18 MIL/uL (ref 3.87–5.11)
RDW: 14.4 % (ref 11.5–15.5)
WBC: 8.4 10*3/uL (ref 4.0–10.5)

## 2017-05-13 ENCOUNTER — Encounter (HOSPITAL_COMMUNITY)
Admission: RE | Admit: 2017-05-13 | Discharge: 2017-05-13 | Disposition: A | Discharge status: Home / Self Care | Payer: Medicare Other | Source: Skilled Nursing Facility | Attending: Internal Medicine | Admitting: Internal Medicine

## 2017-05-13 ENCOUNTER — Encounter: Payer: Self-pay | Admitting: Internal Medicine

## 2017-05-13 ENCOUNTER — Non-Acute Institutional Stay (SKILLED_NURSING_FACILITY): Payer: Medicare Other | Admitting: Internal Medicine

## 2017-05-13 DIAGNOSIS — Z5189 Encounter for other specified aftercare: Secondary | ICD-10-CM | POA: Insufficient documentation

## 2017-05-13 DIAGNOSIS — H3530 Unspecified macular degeneration: Secondary | ICD-10-CM | POA: Diagnosis not present

## 2017-05-13 DIAGNOSIS — M1990 Unspecified osteoarthritis, unspecified site: Secondary | ICD-10-CM | POA: Diagnosis not present

## 2017-05-13 DIAGNOSIS — K625 Hemorrhage of anus and rectum: Secondary | ICD-10-CM | POA: Diagnosis not present

## 2017-05-13 DIAGNOSIS — I482 Chronic atrial fibrillation, unspecified: Secondary | ICD-10-CM

## 2017-05-13 DIAGNOSIS — I639 Cerebral infarction, unspecified: Secondary | ICD-10-CM

## 2017-05-13 DIAGNOSIS — I63312 Cerebral infarction due to thrombosis of left middle cerebral artery: Secondary | ICD-10-CM | POA: Diagnosis not present

## 2017-05-13 DIAGNOSIS — H348112 Central retinal vein occlusion, right eye, stable: Secondary | ICD-10-CM

## 2017-05-13 DIAGNOSIS — H34811 Central retinal vein occlusion, right eye, with macular edema: Secondary | ICD-10-CM | POA: Diagnosis not present

## 2017-05-13 LAB — CBC WITH DIFFERENTIAL/PLATELET
Basophils Absolute: 0 10*3/uL (ref 0.0–0.1)
Basophils Relative: 0 %
Eosinophils Absolute: 0.2 10*3/uL (ref 0.0–0.7)
Eosinophils Relative: 2 %
HCT: 40.8 % (ref 36.0–46.0)
HEMOGLOBIN: 13.1 g/dL (ref 12.0–15.0)
LYMPHS ABS: 2.8 10*3/uL (ref 0.7–4.0)
LYMPHS PCT: 33 %
MCH: 28.6 pg (ref 26.0–34.0)
MCHC: 32.1 g/dL (ref 30.0–36.0)
MCV: 89.1 fL (ref 78.0–100.0)
MONOS PCT: 11 %
Monocytes Absolute: 0.9 10*3/uL (ref 0.1–1.0)
NEUTROS PCT: 54 %
Neutro Abs: 4.4 10*3/uL (ref 1.7–7.7)
Platelets: 232 10*3/uL (ref 150–400)
RBC: 4.58 MIL/uL (ref 3.87–5.11)
RDW: 14.8 % (ref 11.5–15.5)
WBC: 8.3 10*3/uL (ref 4.0–10.5)

## 2017-05-13 NOTE — Progress Notes (Signed)
Location:   [facility] Room Number: 156/W Place of Service:  SNF [phone]) Provider:  Kyung Rudd, Rene Kocher, MD  Patient Care Team: Virgie Dad, MD as PCP - General (Internal Medicine) Rolm Baptise as Physician Assistant (Internal Medicine)  Extended Emergency Contact Information Primary Emergency Contact: Adin Hector, [place] [address] [name] of [place] Phone: [phone] Relation: Son Secondary Emergency Contact: [name],  [address] [name] of [place] Phone: [phone] Mobile Phone: [phone] Relation: Son  Code Status:  DNR Goals of care: Advanced Directive information Advanced Directives 05/13/2017  Does Patient Have a Medical Advance Directive? Yes  Type of Advance Directive Out of facility DNR (pink MOST or yellow form)  Does patient want to make changes to medical advance directive? No - Patient declined  Would patient like information on creating a medical advance directive? No - Patient declined  Pre-existing out of facility DNR order (yellow form or pink MOST form) -     Chief Complaint  Patient presents with  . Acute Visit    Vision Loss    HPI:  Pt is a 83 y.o. female seen today for an acute visit for Sudden loss of vision from her right eye.  Patient has h/o Atrial fibrillation recently started on Eliquis, S/P Acute CVA involving left MCA ,ICA stenosis,Diastolic CHF, hyperlipidemia, And Hypertension And Arthritis with Chronic Knee and Back Pain.  Patient son came to me complaining that her mom says she cannot see from her right Eye. Per patient she has 2 episodes since yesterday that she cannot see from her right eye. Her vision did come back but it is blurry. She denies any headache. Denies any seeing any floaters.Denies having these episodes before.  Past Medical History:  Diagnosis Date  . Atrial fibrillation ([place])   . Breast nodule 11/15/2014  .  Breast pain, left 11/15/2014  . Carotid artery disease ([place])   . Dizziness   . Dyspnea    Previous CPX suggesting possible restrictive physiology, respiratory muscle fatigue, diastolic dysfunction  . Essential hypertension, benign   . Hyperlipidemia   . Oxygen dependent    2 liter  . Rib pain on left side 11/15/2014  . Seasonal allergies   . Shingles 05/04/2015  . Toe fracture, left    left big toe   Past Surgical History:  Procedure Laterality Date  . COLONOSCOPY  02/22/2012   Procedure: COLONOSCOPY;  Surgeon: Rogene Houston, MD;  [facility];  Service: Endoscopy;  Laterality: N/A;  100  . KNEE SURGERY     bilateral    Allergies  Allergen Reactions  . Sulfur Swelling    Rash also per patient    Outpatient Encounter Prescriptions as of 05/13/2017  Medication Sig  . acetaminophen (TYLENOL) 500 MG tablet Take 1,000 mg by mouth 3 (three) times daily.  Jearl Klinefelter ELLIPTA 62.5-25 MCG/INH AEPB Inhale 1 Inhaler into the lungs daily.   Marland Kitchen apixaban (ELIQUIS) 2.5 MG TABS tablet Take 1 tablet (2.5 mg total) by mouth 2 (two) times daily.  Marland Kitchen aspirin 81 MG chewable tablet Chew 81 mg by mouth daily.  Marland Kitchen atorvastatin (LIPITOR) 80 MG tablet Take 1 tablet (80 mg total) by mouth daily at 6 PM.  . cycloSPORINE (RESTASIS) 0.05 % ophthalmic emulsion Place 1 drop into both eyes every 12 (twelve) hours.   . diclofenac sodium (  VOLTAREN) 1 % GEL Apply 2 g topically 2 (two) times daily.  Marland Kitchen diltiazem (CARDIZEM CD) 180 MG 24 hr capsule TAKE ONE CAPSULE BY MOUTH ONCE DAILY.  . furosemide (LASIX) 20 MG tablet Take 20 mg by mouth every other day. Alternate  With 40 mg tablet  . furosemide (LASIX) 40 MG tablet Take 40 mg by mouth every other day. Alternate with 20 mg tablet  . Multiple Vitamin (MULITIVITAMIN WITH MINERALS) TABS Take 1 tablet by mouth every other day.   Marland Kitchen omeprazole (PRILOSEC) 20 MG capsule Take 20 mg by mouth daily.    . OXYGEN Inhale 2 L into the lungs daily. Oxygen @@ 2 L/mn via qhs.  Twice a day   . Polyethyl Glycol-Propyl Glycol (SYSTANE OP) Place 1 drop into both eyes 3 (three) times daily. For dry eyes  . polyethylene glycol (MIRALAX / GLYCOLAX) packet Take 17 g by mouth daily.  . potassium chloride (K-DUR,KLOR-CON) 10 MEQ tablet Take 20 mEq by mouth daily.  Marland Kitchen PROAIR HFA 108 (90 BASE) MCG/ACT inhaler 2 puffs 3 (three) times daily as needed for wheezing (cough).   Marland Kitchen PROCTO-MED HC 2.5 % rectal cream Place 1 application rectally daily as needed for hemorrhoids.   . promethazine (PHENERGAN) 12.5 MG tablet Take 12.5 mg by mouth every 6 (six) hours as needed for nausea or vomiting.  . traMADol (ULTRAM) 50 MG tablet Give 50 mg by mouth once a morning. Take one tablet by mouth at bedtime 10pm and Take one tablet by mouth every 6 hours as needed for pain. Hold for sedation or respiratory distress   No facility-administered encounter medications on file as of 05/13/2017.      Review of Systems  Review of Systems  Constitutional: Negative for activity change, appetite change, chills, diaphoresis, fatigue and fever.  HENT: Negative for mouth sores, postnasal drip, rhinorrhea, sinus pain and sore throat.   Respiratory: Negative for apnea, cough, chest tightness, shortness of breath and wheezing.   Cardiovascular: Negative for chest pain, palpitations and leg swelling.  Gastrointestinal: Negative for abdominal distention, abdominal pain, constipation, diarrhea, nausea and vomiting.  Genitourinary: Negative for dysuria and frequency.  Musculoskeletal: Negative for arthralgias, joint swelling and myalgias.  Skin: Negative for rash.  Neurological: Negative for dizziness, syncope, weakness, light-headedness and numbness.  Psychiatric/Behavioral: Negative for behavioral problems, confusion and sleep disturbance.     Immunization History  Administered Date(s) Administered  . Influenza-Unspecified 10/10/2013   Pertinent  Health Maintenance Due  Topic Date Due  . INFLUENZA VACCINE   11/09/2017 (Originally 07/10/2017)  . DEXA SCAN  11/09/2017 (Originally 01/15/1986)  . PNA vac Low Risk Adult (1 of 2 - PCV13) 11/09/2017 (Originally 01/15/1986)   No flowsheet data found. Functional Status Survey:    Vitals:   05/13/17 1502  BP: 134/74  Pulse: 78  Resp: 18  Temp: 97.9 F (36.6 C)  TempSrc: Oral   There is no height or weight on file to calculate BMI. Physical Exam  Constitutional: She appears well-developed and well-nourished.  HENT:  Head: Normocephalic.  Mouth/Throat: Oropharynx is clear and moist.  Eyes: Conjunctivae, EOM and lids are normal. Pupils are equal, round, and reactive to light.  Cardiovascular: Normal rate and normal heart sounds.   Pulmonary/Chest: Effort normal and breath sounds normal. She has no wheezes. She has no rales.  Abdominal: Soft. Bowel sounds are normal. She exhibits no distension. There is no tenderness. There is no rebound.  Musculoskeletal: She exhibits no edema.  Neurological: She is alert.  No Focal deficit  Skin: Skin is warm and dry.  Psychiatric: She has a normal mood and affect. Her behavior is normal. Thought content normal.    Labs reviewed:  Recent Labs  03/25/17 1100 05/08/17 0730 05/09/17 0720  NA 138 137 137  K 3.6 4.5 4.2  CL 102 102 103  CO2 28 26 25   GLUCOSE 101* 96 95  BUN 20 23* 27*  CREATININE 0.73 0.72 0.78  CALCIUM 8.8* 8.8* 8.9    Recent Labs  12/06/16 0615 12/24/16 0800 05/09/17 0720  AST 23 28 23   ALT 16 16 16   ALKPHOS 99 101 122  BILITOT 1.2 0.4 0.8  PROT 6.8 6.7 6.7  ALBUMIN 3.8 3.7 3.6    Recent Labs  05/08/17 0730 05/09/17 0720 05/10/17 0700 05/13/17 0715  WBC 9.3 9.3 8.4 8.3  NEUTROABS 5.4 6.2  --  4.4  HGB 13.5 12.9 11.9* 13.1  HCT 41.0 39.9 36.6 40.8  MCV 87.6 87.7 87.6 89.1  PLT 227 230 199 232   No results found for: TSH Lab Results  Component Value Date   HGBA1C 5.5 12/06/2016   Lab Results  Component Value Date   CHOL 96 12/07/2016   HDL 47 12/07/2016    LDLCALC 43 12/07/2016   TRIG 31 12/07/2016   CHOLHDL 2.0 12/07/2016    Significant Diagnostic Results in last 30 days:  No results found.  Assessment/Plan Acute Blurriness  of right Eye With her h/o ICA stenosis this can be TIA. She is already on Eliquis and aspirin. D/W the son. He does not want her to go to ED. He is going to call his ophthalmologist to evaluate her  Addendum They came from the Eye appointment and he thinks patient has Retinal vein Occlusion. Due  to her age no aggressive treatment right now. Would follow with Opthalmology in 1 month. They said she would never have normal correction in that eye.  Family/ staff Communication:   Labs/tests ordered:

## 2017-05-14 DIAGNOSIS — H348112 Central retinal vein occlusion, right eye, stable: Secondary | ICD-10-CM | POA: Insufficient documentation

## 2017-05-20 DIAGNOSIS — I63312 Cerebral infarction due to thrombosis of left middle cerebral artery: Secondary | ICD-10-CM | POA: Diagnosis not present

## 2017-05-20 DIAGNOSIS — I69320 Aphasia following cerebral infarction: Secondary | ICD-10-CM | POA: Diagnosis not present

## 2017-05-20 DIAGNOSIS — Z5189 Encounter for other specified aftercare: Secondary | ICD-10-CM | POA: Diagnosis not present

## 2017-05-20 DIAGNOSIS — K625 Hemorrhage of anus and rectum: Secondary | ICD-10-CM | POA: Diagnosis not present

## 2017-05-21 DIAGNOSIS — K625 Hemorrhage of anus and rectum: Secondary | ICD-10-CM | POA: Diagnosis not present

## 2017-05-21 DIAGNOSIS — I63312 Cerebral infarction due to thrombosis of left middle cerebral artery: Secondary | ICD-10-CM | POA: Diagnosis not present

## 2017-05-21 DIAGNOSIS — I69320 Aphasia following cerebral infarction: Secondary | ICD-10-CM | POA: Diagnosis not present

## 2017-05-21 DIAGNOSIS — Z5189 Encounter for other specified aftercare: Secondary | ICD-10-CM | POA: Diagnosis not present

## 2017-05-22 ENCOUNTER — Ambulatory Visit (INDEPENDENT_AMBULATORY_CARE_PROVIDER_SITE_OTHER): Payer: Medicare Other | Admitting: Internal Medicine

## 2017-05-22 ENCOUNTER — Encounter (INDEPENDENT_AMBULATORY_CARE_PROVIDER_SITE_OTHER): Payer: Self-pay | Admitting: Internal Medicine

## 2017-05-22 VITALS — BP 132/72 | HR 64 | Temp 97.9°F | Ht 66.0 in | Wt 150.0 lb

## 2017-05-22 DIAGNOSIS — I63312 Cerebral infarction due to thrombosis of left middle cerebral artery: Secondary | ICD-10-CM | POA: Diagnosis not present

## 2017-05-22 DIAGNOSIS — I69320 Aphasia following cerebral infarction: Secondary | ICD-10-CM | POA: Diagnosis not present

## 2017-05-22 DIAGNOSIS — Z5189 Encounter for other specified aftercare: Secondary | ICD-10-CM | POA: Diagnosis not present

## 2017-05-22 DIAGNOSIS — Z8719 Personal history of other diseases of the digestive system: Secondary | ICD-10-CM

## 2017-05-22 DIAGNOSIS — K645 Perianal venous thrombosis: Secondary | ICD-10-CM | POA: Diagnosis not present

## 2017-05-22 DIAGNOSIS — K625 Hemorrhage of anus and rectum: Secondary | ICD-10-CM | POA: Diagnosis not present

## 2017-05-22 NOTE — Patient Instructions (Signed)
Continue the Proctofoam. May consider referral to Dr. Oneida Alar for possible banding

## 2017-05-22 NOTE — Progress Notes (Signed)
Subjective:    Patient ID: Wendy Perkins, female    DOB: Apr 22, 1919, 83 y.o.   MRN: [id number]  HPI Here today with c/o rectal bleeding. Resident of [facility].  Here appetite is good. No weight loss. She is w/c bound. She has a BM x 1 a day.  Some BRRB Fecal occult blood test positive 05/08/2017  CVA in December and maintained on Eliquis. Hx of Afib  CBC    Component Value Date/Time   WBC 8.3 05/13/2017 0715   RBC 4.58 05/13/2017 0715   HGB 13.1 05/13/2017 0715   HCT 40.8 05/13/2017 0715   PLT 232 05/13/2017 0715   MCV 89.1 05/13/2017 0715   MCH 28.6 05/13/2017 0715   MCHC 32.1 05/13/2017 0715   RDW 14.8 05/13/2017 0715   LYMPHSABS 2.8 05/13/2017 0715   MONOABS 0.9 05/13/2017 0715   EOSABS 0.2 05/13/2017 0715   BASOSABS 0.0 05/13/2017 0715     02/12/2012 Colonoscopy:  Procedure:   Colonoscopy  Indications:  Patient is a 83 year old Caucasian female who experienced three day episode of bright red blood per rectum. She is undergoing diagnostic colonoscopy  Impression:  Examination performed to cecum. Pancolonic diverticulosis (she has multiple diverticula and sigmoid colon and few more involving the rest of the colon). 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 Scott at ileocecal valve secondary to healed ulceration(NSAID). Small external hemorrhoids. Review of Systems[ Past Medical History:  Diagnosis Date  . Atrial fibrillation ([place])   . Breast nodule 11/15/2014  . Breast pain, left 11/15/2014  . Carotid artery disease ([place])   . Dizziness   . Dyspnea    Previous CPX suggesting possible restrictive physiology, respiratory muscle fatigue, diastolic dysfunction  . Essential hypertension, benign   . Hyperlipidemia   . Oxygen dependent    2 liter  . Rib pain on left side 11/15/2014  . Seasonal allergies   . Shingles 05/04/2015  . Toe fracture, left    left big toe    Past Surgical History:  Procedure Laterality Date  . COLONOSCOPY  02/22/2012     Procedure: COLONOSCOPY;  Surgeon: Rogene Houston, MD;  [facility];  Service: Endoscopy;  Laterality: N/A;  100  . KNEE SURGERY     bilateral    Allergies  Allergen Reactions  . Sulfur Swelling    Rash also per patient    Current Outpatient Prescriptions on File Prior to Visit  Medication Sig Dispense Refill  . acetaminophen (TYLENOL) 500 MG tablet Take 1,000 mg by mouth 3 (three) times daily.    Jearl Klinefelter ELLIPTA 62.5-25 MCG/INH AEPB Inhale 1 Inhaler into the lungs daily.     Marland Kitchen apixaban (ELIQUIS) 2.5 MG TABS tablet Take 1 tablet (2.5 mg total) by mouth 2 (two) times daily. 60 tablet   . aspirin 81 MG chewable tablet Chew 81 mg by mouth daily.    Marland Kitchen atorvastatin (LIPITOR) 80 MG tablet Take 1 tablet (80 mg total) by mouth daily at 6 PM.    . cycloSPORINE (RESTASIS) 0.05 % ophthalmic emulsion Place 1 drop into both eyes every 12 (twelve) hours.     . diclofenac sodium (VOLTAREN) 1 % GEL Apply 2 g topically 2 (two) times daily.    Marland Kitchen diltiazem (CARDIZEM CD) 180 MG 24 hr capsule TAKE ONE CAPSULE BY MOUTH ONCE DAILY. 30 capsule 6  . furosemide (LASIX) 20 MG tablet Take 20 mg by mouth every other day. Alternate  With 40 mg tablet    .  furosemide (LASIX) 40 MG tablet Take 40 mg by mouth every other day. Alternate with 20 mg tablet    . Multiple Vitamin (MULITIVITAMIN WITH MINERALS) TABS Take 1 tablet by mouth every other day.     Marland Kitchen omeprazole (PRILOSEC) 20 MG capsule Take 20 mg by mouth daily.      . OXYGEN Inhale 2 L into the lungs daily. Oxygen @@ 2 L/mn via qhs. Twice a day     . Polyethyl Glycol-Propyl Glycol (SYSTANE OP) Place 1 drop into both eyes 3 (three) times daily. For dry eyes    . polyethylene glycol (MIRALAX / GLYCOLAX) packet Take 17 g by mouth daily.    . potassium chloride (K-DUR,KLOR-CON) 10 MEQ tablet Take 20 mEq by mouth daily.    Marland Kitchen PROAIR HFA 108 (90 BASE) MCG/ACT inhaler 2 puffs 3 (three) times daily as needed for wheezing (cough).     Marland Kitchen PROCTO-MED HC 2.5 %  rectal cream Place 1 application rectally daily as needed for hemorrhoids.     . promethazine (PHENERGAN) 12.5 MG tablet Take 12.5 mg by mouth every 6 (six) hours as needed for nausea or vomiting.    . traMADol (ULTRAM) 50 MG tablet Give 50 mg by mouth once a morning. Take one tablet by mouth at bedtime 10pm and Take one tablet by mouth every 6 hours as needed for pain. Hold for sedation or respiratory distress    . [DISCONTINUED] Albuterol (VENTOLIN IN) Inhale into the lungs.       No current facility-administered medications on file prior to visit.    No current outpatient prescriptions on file.   No current facility-administered medications for this visit.         Objective:   Physical Exam Blood pressure 132/72, pulse 64, temperature 97.9 F (36.6 C), height 5\' 6"  (1.676 m), weight 150 lb (68 kg). Alert and oriented. Skin warm and dry. Oral mucosa is moist.   . Sclera anicteric, conjunctivae is pink. Thyroid not enlarged. No cervical lymphadenopathy. Lungs clear. Heart regular rate and rhythm.  Abdomen is soft. Bowel sounds are positive. No hepatomegaly. No abdominal masses felt. No tenderness.  No edema to lower extremities.  Thrombosed external hemorrhoids noted.            Assessment & Plan:  Thrombosed hemorrhoid. Continue the Procto foam. Hemoglobin is stable.  May consider referral to Dr. Oneida Alar for banding.

## 2017-05-23 DIAGNOSIS — I69320 Aphasia following cerebral infarction: Secondary | ICD-10-CM | POA: Diagnosis not present

## 2017-05-23 DIAGNOSIS — K625 Hemorrhage of anus and rectum: Secondary | ICD-10-CM | POA: Diagnosis not present

## 2017-05-23 DIAGNOSIS — Z5189 Encounter for other specified aftercare: Secondary | ICD-10-CM | POA: Diagnosis not present

## 2017-05-23 DIAGNOSIS — I63312 Cerebral infarction due to thrombosis of left middle cerebral artery: Secondary | ICD-10-CM | POA: Diagnosis not present

## 2017-05-24 DIAGNOSIS — Z5189 Encounter for other specified aftercare: Secondary | ICD-10-CM | POA: Diagnosis not present

## 2017-05-24 DIAGNOSIS — I69320 Aphasia following cerebral infarction: Secondary | ICD-10-CM | POA: Diagnosis not present

## 2017-05-24 DIAGNOSIS — I63312 Cerebral infarction due to thrombosis of left middle cerebral artery: Secondary | ICD-10-CM | POA: Diagnosis not present

## 2017-05-24 DIAGNOSIS — K625 Hemorrhage of anus and rectum: Secondary | ICD-10-CM | POA: Diagnosis not present

## 2017-05-27 DIAGNOSIS — Z5189 Encounter for other specified aftercare: Secondary | ICD-10-CM | POA: Diagnosis not present

## 2017-05-27 DIAGNOSIS — I69320 Aphasia following cerebral infarction: Secondary | ICD-10-CM | POA: Diagnosis not present

## 2017-05-27 DIAGNOSIS — I63312 Cerebral infarction due to thrombosis of left middle cerebral artery: Secondary | ICD-10-CM | POA: Diagnosis not present

## 2017-05-27 DIAGNOSIS — K625 Hemorrhage of anus and rectum: Secondary | ICD-10-CM | POA: Diagnosis not present

## 2017-05-28 DIAGNOSIS — K625 Hemorrhage of anus and rectum: Secondary | ICD-10-CM | POA: Diagnosis not present

## 2017-05-28 DIAGNOSIS — Z5189 Encounter for other specified aftercare: Secondary | ICD-10-CM | POA: Diagnosis not present

## 2017-05-28 DIAGNOSIS — I69320 Aphasia following cerebral infarction: Secondary | ICD-10-CM | POA: Diagnosis not present

## 2017-05-28 DIAGNOSIS — I63312 Cerebral infarction due to thrombosis of left middle cerebral artery: Secondary | ICD-10-CM | POA: Diagnosis not present

## 2017-05-29 DIAGNOSIS — Z5189 Encounter for other specified aftercare: Secondary | ICD-10-CM | POA: Diagnosis not present

## 2017-05-29 DIAGNOSIS — I63312 Cerebral infarction due to thrombosis of left middle cerebral artery: Secondary | ICD-10-CM | POA: Diagnosis not present

## 2017-05-29 DIAGNOSIS — K625 Hemorrhage of anus and rectum: Secondary | ICD-10-CM | POA: Diagnosis not present

## 2017-05-29 DIAGNOSIS — I69320 Aphasia following cerebral infarction: Secondary | ICD-10-CM | POA: Diagnosis not present

## 2017-05-30 ENCOUNTER — Encounter: Payer: Self-pay | Admitting: Internal Medicine

## 2017-05-30 NOTE — Progress Notes (Signed)
Location:   [facility] Room Number: 156/W Place of Service:  SNF [phone]) Provider:  Kyung Rudd, Rene Kocher, MD  Patient Care Team: Virgie Dad, MD as PCP - General (Internal Medicine) Rolm Baptise as Physician Assistant (Internal Medicine)  Extended Emergency Contact Information Primary Emergency Contact: Adin Hector, [place] [address] [name] of [place] Phone: [phone] Relation: Son Secondary Emergency Contact: [name], [place] [address] [name] of [place] Phone: [phone] Mobile Phone: [phone] Relation: Son  Code Status:  DNR Goals of care: Advanced Directive information Advanced Directives 05/30/2017  Does Patient Have a Medical Advance Directive? Yes  Type of Advance Directive Out of facility DNR (pink MOST or yellow form)  Does patient want to make changes to medical advance directive? No - Patient declined  Would patient like information on creating a medical advance directive? No - Patient declined  Pre-existing out of facility DNR order (yellow form or pink MOST form) -     Chief Complaint  Patient presents with  . Medical Management of Chronic Issues    Routine Visit    HPI:  Pt is a 83 y.o. female seen today for medical management of chronic diseases.     Past Medical History:  Diagnosis Date  . Atrial fibrillation ([place])   . Breast nodule 11/15/2014  . Breast pain, left 11/15/2014  . Carotid artery disease ([place])   . Dizziness   . Dyspnea    Previous CPX suggesting possible restrictive physiology, respiratory muscle fatigue, diastolic dysfunction  . Essential hypertension, benign   . Hyperlipidemia   . Oxygen dependent    2 liter  . Rib pain on left side 11/15/2014  . Seasonal allergies   . Shingles 05/04/2015  . Toe fracture, left    left big toe   Past Surgical History:  Procedure Laterality Date  . COLONOSCOPY  02/22/2012   Procedure:  COLONOSCOPY;  Surgeon: Rogene Houston, MD;  [facility];  Service: Endoscopy;  Laterality: N/A;  100  . KNEE SURGERY     bilateral    Allergies  Allergen Reactions  . Sulfur Swelling    Rash also per patient    Outpatient Encounter Prescriptions as of 05/30/2017  Medication Sig  . acetaminophen (TYLENOL) 500 MG tablet Take 1,000 mg by mouth 3 (three) times daily.  Jearl Klinefelter ELLIPTA 62.5-25 MCG/INH AEPB Inhale 1 Inhaler into the lungs daily.   Marland Kitchen apixaban (ELIQUIS) 2.5 MG TABS tablet Take 1 tablet (2.5 mg total) by mouth 2 (two) times daily.  Marland Kitchen aspirin 81 MG chewable tablet Chew 81 mg by mouth daily.  Marland Kitchen atorvastatin (LIPITOR) 80 MG tablet Take 1 tablet (80 mg total) by mouth daily at 6 PM.  . cycloSPORINE (RESTASIS) 0.05 % ophthalmic emulsion Place 1 drop into both eyes every 12 (twelve) hours.   . diclofenac sodium (VOLTAREN) 1 % GEL Apply 2 g topically 2 (two) times daily.  Marland Kitchen diltiazem (CARDIZEM CD) 180 MG 24 hr capsule TAKE ONE CAPSULE BY MOUTH ONCE DAILY.  . furosemide (LASIX) 20 MG tablet Take 20 mg by mouth every other day. Alternate  With 40 mg tablet  . furosemide (LASIX) 40 MG tablet Take 40 mg by mouth every other day. Alternate with 20 mg tablet  . Multiple Vitamin (MULITIVITAMIN WITH MINERALS) TABS Take 1 tablet by mouth every other day.   Marland Kitchen  omeprazole (PRILOSEC) 20 MG capsule Take 20 mg by mouth daily.    . OXYGEN Inhale 2 L into the lungs daily. Oxygen @@ 2 L/mn via qhs. Twice a day   . Polyethyl Glycol-Propyl Glycol (SYSTANE OP) Place 1 drop into both eyes 3 (three) times daily. For dry eyes  . polyethylene glycol (MIRALAX / GLYCOLAX) packet Take 17 g by mouth daily.  . potassium chloride (K-DUR,KLOR-CON) 10 MEQ tablet Take 20 mEq by mouth daily.  Marland Kitchen PROAIR HFA 108 (90 BASE) MCG/ACT inhaler 2 puffs 3 (three) times daily as needed for wheezing (cough).   . promethazine (PHENERGAN) 12.5 MG tablet Take 12.5 mg by mouth every 6 (six) hours as needed for nausea or  vomiting.  . traMADol (ULTRAM) 50 MG tablet Give 50 mg by mouth once a morning. Take one tablet by mouth at bedtime 10pm and Take one tablet by mouth every 6 hours as needed for pain. Hold for sedation or respiratory distress  . [DISCONTINUED] Polyethyl Glycol-Propyl Glycol (SYSTANE) 0.4-0.3 % SOLN Apply to eye.  . [DISCONTINUED] PROCTO-MED HC 2.5 % rectal cream Place 1 application rectally daily as needed for hemorrhoids.    No facility-administered encounter medications on file as of 05/30/2017.      Review of Systems  Immunization History  Administered Date(s) Administered  . Influenza-Unspecified 10/10/2013   Pertinent  Health Maintenance Due  Topic Date Due  . INFLUENZA VACCINE  11/09/2017 (Originally 07/10/2017)  . DEXA SCAN  11/09/2017 (Originally 01/15/1986)  . PNA vac Low Risk Adult (1 of 2 - PCV13) 11/09/2017 (Originally 01/15/1986)   No flowsheet data found. Functional Status Survey:    Vitals:   05/30/17 1438  BP: (!) 155/66  Pulse: 66  Resp: 18  Temp: 97.8 F (36.6 C)  TempSrc: Oral   There is no height or weight on file to calculate BMI. Physical Exam  Labs reviewed:  Recent Labs  03/25/17 1100 05/08/17 0730 05/09/17 0720  NA 138 137 137  K 3.6 4.5 4.2  CL 102 102 103  CO2 28 26 25   GLUCOSE 101* 96 95  BUN 20 23* 27*  CREATININE 0.73 0.72 0.78  CALCIUM 8.8* 8.8* 8.9    Recent Labs  12/06/16 0615 12/24/16 0800 05/09/17 0720  AST 23 28 23   ALT 16 16 16   ALKPHOS 99 101 122  BILITOT 1.2 0.4 0.8  PROT 6.8 6.7 6.7  ALBUMIN 3.8 3.7 3.6    Recent Labs  05/08/17 0730 05/09/17 0720 05/10/17 0700 05/13/17 0715  WBC 9.3 9.3 8.4 8.3  NEUTROABS 5.4 6.2  --  4.4  HGB 13.5 12.9 11.9* 13.1  HCT 41.0 39.9 36.6 40.8  MCV 87.6 87.7 87.6 89.1  PLT 227 230 199 232   No results found for: TSH Lab Results  Component Value Date   HGBA1C 5.5 12/06/2016   Lab Results  Component Value Date   CHOL 96 12/07/2016   HDL 47 12/07/2016   LDLCALC 43  12/07/2016   TRIG 31 12/07/2016   CHOLHDL 2.0 12/07/2016    Significant Diagnostic Results in last 30 days:  No results found.  Assessment/Plan There are no diagnoses linked to this encounter.   Family/ staff Communication:   Labs/tests ordered:       This encounter was created in error - please disregard.

## 2017-06-03 ENCOUNTER — Non-Acute Institutional Stay (SKILLED_NURSING_FACILITY): Payer: Medicare Other | Admitting: Internal Medicine

## 2017-06-03 ENCOUNTER — Encounter: Payer: Self-pay | Admitting: Internal Medicine

## 2017-06-03 DIAGNOSIS — K625 Hemorrhage of anus and rectum: Secondary | ICD-10-CM | POA: Diagnosis not present

## 2017-06-03 DIAGNOSIS — K649 Unspecified hemorrhoids: Secondary | ICD-10-CM | POA: Diagnosis not present

## 2017-06-03 DIAGNOSIS — I63312 Cerebral infarction due to thrombosis of left middle cerebral artery: Secondary | ICD-10-CM | POA: Diagnosis not present

## 2017-06-03 DIAGNOSIS — I482 Chronic atrial fibrillation, unspecified: Secondary | ICD-10-CM

## 2017-06-03 DIAGNOSIS — I6523 Occlusion and stenosis of bilateral carotid arteries: Secondary | ICD-10-CM | POA: Diagnosis not present

## 2017-06-03 DIAGNOSIS — I10 Essential (primary) hypertension: Secondary | ICD-10-CM

## 2017-06-03 DIAGNOSIS — Z5189 Encounter for other specified aftercare: Secondary | ICD-10-CM | POA: Diagnosis not present

## 2017-06-03 DIAGNOSIS — I5032 Chronic diastolic (congestive) heart failure: Secondary | ICD-10-CM

## 2017-06-03 DIAGNOSIS — I69320 Aphasia following cerebral infarction: Secondary | ICD-10-CM | POA: Diagnosis not present

## 2017-06-03 DIAGNOSIS — I639 Cerebral infarction, unspecified: Secondary | ICD-10-CM

## 2017-06-03 NOTE — Progress Notes (Signed)
Location:   [place] Room Number: 156/W Place of Service:  SNF [phone]) Provider:  Kyung Rudd, Rene Kocher, MD  Patient Care Team: Virgie Dad, MD as PCP - General (Internal Medicine) Rolm Baptise as Physician Assistant (Internal Medicine)  Extended Emergency Contact Information Primary Emergency Contact: Adin Hector, [place] [address] [name] of [place] Phone: [phone] Relation: Son Secondary Emergency Contact: [name], [place] [address] [name] of [place] Phone: [phone] Mobile Phone: [phone] Relation: Son  Code Status:  DNR Goals of care: Advanced Directive information Advanced Directives 06/03/2017  Does Patient Have a Medical Advance Directive? Yes  Type of Advance Directive Out of facility DNR (pink MOST or yellow form)  Does patient want to make changes to medical advance directive? No - Patient declined  Would patient like information on creating a medical advance directive? No - Patient declined  Pre-existing out of facility DNR order (yellow form or pink MOST form) -     Chief Complaint  Patient presents with  . Medical Management of Chronic Issues    Routine Visit    HPI:  Pt is a 83 y.o. female seen today for medical management of chronic diseases.    Patient has h/o Atrial fibrillation on Eliquis, S/P Acute CVA involving left MCA ,ICA stenosis,Diastolic CHF, hyperlipidemia, And Hypertension And Arthritis with Chronic Knee and Back Pain.And recent right retinal vein occlussion  Patient has been doing well in facility. Since my last visit she did have some blurriness in her right eye vision and was seen by ophthalmologist with diagnosis of retinal vein Occlusion. She still continues to have blurriness in that eye. No aggressive treatment is needed and she will follow with them in 1 month. Her knee and back pain is controlled on Ultram. Her speech continues  to improve. Her weight is now stable at 142 lbs  since Lasix was increased and her swelling is better. She also not have any rectal bleeding due to her Hemorrhoids. Past Medical History:  Diagnosis Date  . Atrial fibrillation ([place])   . Breast nodule 11/15/2014  . Breast pain, left 11/15/2014  . Carotid artery disease ([place])   . Dizziness   . Dyspnea    Previous CPX suggesting possible restrictive physiology, respiratory muscle fatigue, diastolic dysfunction  . Essential hypertension, benign   . Hyperlipidemia   . Oxygen dependent    2 liter  . Rib pain on left side 11/15/2014  . Seasonal allergies   . Shingles 05/04/2015  . Toe fracture, left    left big toe   Past Surgical History:  Procedure Laterality Date  . COLONOSCOPY  02/22/2012   Procedure: COLONOSCOPY;  Surgeon: Rogene Houston, MD;  [facility];  Service: Endoscopy;  Laterality: N/A;  100  . KNEE SURGERY     bilateral    Allergies  Allergen Reactions  . Sulfur Swelling    Rash also per patient    Outpatient Encounter Prescriptions as of 06/03/2017  Medication Sig  . acetaminophen (TYLENOL) 500 MG tablet Take 1,000 mg by mouth 3 (three) times daily.  Jearl Klinefelter ELLIPTA 62.5-25 MCG/INH AEPB Inhale 1 Inhaler into the lungs daily.   Marland Kitchen apixaban (ELIQUIS) 2.5 MG TABS tablet Take 1 tablet (2.5 mg total) by mouth 2 (two) times daily.  Marland Kitchen aspirin 81 MG chewable tablet Chew 81 mg by mouth  daily.  . atorvastatin (LIPITOR) 80 MG tablet Take 1 tablet (80 mg total) by mouth daily at 6 PM.  . cycloSPORINE (RESTASIS) 0.05 % ophthalmic emulsion Place 1 drop into both eyes every 12 (twelve) hours.   . diclofenac sodium (VOLTAREN) 1 % GEL Apply 2 g topically 2 (two) times daily.  Marland Kitchen diltiazem (CARDIZEM CD) 180 MG 24 hr capsule TAKE ONE CAPSULE BY MOUTH ONCE DAILY.  . furosemide (LASIX) 20 MG tablet Take 20 mg by mouth every other day. Alternate  With 40 mg tablet  . furosemide (LASIX) 40 MG tablet Take 40 mg by mouth every other  day. Alternate with 20 mg tablet  . Multiple Vitamin (MULITIVITAMIN WITH MINERALS) TABS Take 1 tablet by mouth every other day.   Marland Kitchen omeprazole (PRILOSEC) 20 MG capsule Take 20 mg by mouth daily.    . OXYGEN Inhale 2 L into the lungs daily. Oxygen @@ 2 L/mn via qhs. Twice a day   . Polyethyl Glycol-Propyl Glycol (SYSTANE OP) Place 1 drop into both eyes 3 (three) times daily. For dry eyes  . polyethylene glycol (MIRALAX / GLYCOLAX) packet Take 17 g by mouth daily.  . potassium chloride (K-DUR,KLOR-CON) 10 MEQ tablet Take 20 mEq by mouth daily.  Marland Kitchen PROAIR HFA 108 (90 BASE) MCG/ACT inhaler 2 puffs 3 (three) times daily as needed for wheezing (cough).   . promethazine (PHENERGAN) 12.5 MG tablet Take 12.5 mg by mouth every 6 (six) hours as needed for nausea or vomiting.  . traMADol (ULTRAM) 50 MG tablet Give 50 mg by mouth once a morning. Take one tablet by mouth at bedtime 10pm and Take one tablet by mouth every 6 hours as needed for pain. Hold for sedation or respiratory distress   No facility-administered encounter medications on file as of 06/03/2017.      Review of Systems  Review of Systems  Constitutional: Negative for activity change, appetite change, chills, diaphoresis, fatigue and fever.  HENT: Negative for mouth sores, postnasal drip, rhinorrhea, sinus pain and sore throat.   Respiratory: Negative for apnea, cough, chest tightness, shortness of breath and wheezing.   Cardiovascular: Negative for chest pain, palpitations  Gastrointestinal: Negative for abdominal distention, abdominal pain, constipation, diarrhea, nausea and vomiting.  Genitourinary: Negative for dysuria and frequency.  Musculoskeletal: positive for arthralgias, joint swelling and myalgias.  Skin: Negative for rash.  Neurological: Negative for dizziness, syncope, weakness, light-headedness and numbness.  Psychiatric/Behavioral: Negative for behavioral problems, confusion and sleep disturbance.     Immunization History   Administered Date(s) Administered  . Influenza-Unspecified 10/10/2013   Pertinent  Health Maintenance Due  Topic Date Due  . INFLUENZA VACCINE  11/09/2017 (Originally 07/10/2017)  . DEXA SCAN  11/09/2017 (Originally 01/15/1986)  . PNA vac Low Risk Adult (1 of 2 - PCV13) 11/09/2017 (Originally 01/15/1986)   No flowsheet data found. Functional Status Survey:    Vitals:   06/03/17 1023  BP: 108/66  Pulse: 60  Resp: 18  Temp: 97.5 F (36.4 C)  TempSrc: Oral  Weight: 141 lb 14.4 oz (64.4 kg)  Height: 5\' 4"  (1.626 m)   Body mass index is 24.36 kg/m. Physical Exam  Constitutional: She appears well-developed and well-nourished.  HENT:  Head: Normocephalic.  Mouth/Throat: Oropharynx is clear and moist.  Eyes: Pupils are equal, round, and reactive to light.  Neck: Neck supple.  Cardiovascular: Normal rate.  An irregular rhythm present.  Murmur heard. Pulmonary/Chest: Effort normal and breath sounds normal. No respiratory distress. She has no wheezes.  She has no rales.  Abdominal: Soft. Bowel sounds are normal. She exhibits no distension. There is no tenderness. There is no rebound.  Musculoskeletal: She exhibits edema.  Neurological: She is alert.  Skin: Skin is warm and dry.  Psychiatric: She has a normal mood and affect. Her behavior is normal. Thought content normal.    Labs reviewed:  Recent Labs  03/25/17 1100 05/08/17 0730 05/09/17 0720  NA 138 137 137  K 3.6 4.5 4.2  CL 102 102 103  CO2 28 26 25   GLUCOSE 101* 96 95  BUN 20 23* 27*  CREATININE 0.73 0.72 0.78  CALCIUM 8.8* 8.8* 8.9    Recent Labs  12/06/16 0615 12/24/16 0800 05/09/17 0720  AST 23 28 23   ALT 16 16 16   ALKPHOS 99 101 122  BILITOT 1.2 0.4 0.8  PROT 6.8 6.7 6.7  ALBUMIN 3.8 3.7 3.6    Recent Labs  05/08/17 0730 05/09/17 0720 05/10/17 0700 05/13/17 0715  WBC 9.3 9.3 8.4 8.3  NEUTROABS 5.4 6.2  --  4.4  HGB 13.5 12.9 11.9* 13.1  HCT 41.0 39.9 36.6 40.8  MCV 87.6 87.7 87.6 89.1  PLT  227 230 199 232   No results found for: TSH Lab Results  Component Value Date   HGBA1C 5.5 12/06/2016   Lab Results  Component Value Date   CHOL 96 12/07/2016   HDL 47 12/07/2016   LDLCALC 43 12/07/2016   TRIG 31 12/07/2016   CHOLHDL 2.0 12/07/2016    Significant Diagnostic Results in last 30 days:  No results found.  Assessment/Plan Chronic atrial fibrillation  Rate is controlled on Diltiazem. Continue Eliquis  Chronic diastolic heart failure  On Lasix. Doing well. Renal function stable  Essential hypertension BP controlled. No Change  Internal carotid artery stenosis, bilateral Patient on aspirin and Eliquis. Son has decided to not be aggressive.  S/P Cerebrovascular accident with mild Dysphasia Doing well on Eliquis and Aspirin. No change required.  Hemorrhoids Patient has not had any more episodes of rectal bleed. Follows with GI  Hgb Stable. Back and Joint pain Controlled on Ultram. Family/ staff Communication:   Labs/tests ordered:  Repeat CBC and BMP   Total time spent in this patient care encounter was 25_ minutes; greater than 50% of the visit spent counseling patient and coordinating care for problems addressed at this encounter.

## 2017-06-04 ENCOUNTER — Encounter (HOSPITAL_COMMUNITY)
Admission: RE | Admit: 2017-06-04 | Discharge: 2017-06-04 | Disposition: A | Discharge status: Home / Self Care | Payer: Medicare Other | Source: Skilled Nursing Facility | Attending: Internal Medicine | Admitting: Internal Medicine

## 2017-06-04 DIAGNOSIS — I63312 Cerebral infarction due to thrombosis of left middle cerebral artery: Secondary | ICD-10-CM | POA: Diagnosis not present

## 2017-06-04 DIAGNOSIS — Z5189 Encounter for other specified aftercare: Secondary | ICD-10-CM | POA: Insufficient documentation

## 2017-06-04 DIAGNOSIS — M1990 Unspecified osteoarthritis, unspecified site: Secondary | ICD-10-CM | POA: Diagnosis not present

## 2017-06-04 DIAGNOSIS — I69320 Aphasia following cerebral infarction: Secondary | ICD-10-CM | POA: Diagnosis not present

## 2017-06-04 DIAGNOSIS — K625 Hemorrhage of anus and rectum: Secondary | ICD-10-CM | POA: Diagnosis not present

## 2017-06-04 LAB — LIPID PANEL
CHOL/HDL RATIO: 2 ratio
CHOLESTEROL: 98 mg/dL (ref 0–200)
HDL: 50 mg/dL (ref >40)
LDL Cholesterol: 40 mg/dL (ref 0–99)
Triglycerides: 40 mg/dL (ref <150)
VLDL: 8 mg/dL (ref 0–40)

## 2017-06-04 LAB — COMPREHENSIVE METABOLIC PANEL
ALBUMIN: 3.3 g/dL — AB (ref 3.5–5.0)
ALT: 13 U/L — ABNORMAL LOW (ref 14–54)
AST: 19 U/L (ref 15–41)
Alkaline Phosphatase: 105 U/L (ref 38–126)
Anion gap: 7 (ref 5–15)
BUN: 21 mg/dL — AB (ref 6–20)
CHLORIDE: 103 mmol/L (ref 101–111)
CO2: 27 mmol/L (ref 22–32)
Calcium: 8.6 mg/dL — ABNORMAL LOW (ref 8.9–10.3)
Creatinine, Ser: 0.71 mg/dL (ref 0.44–1.00)
GFR calc Af Amer: >60 mL/min (ref >60)
Glucose, Bld: 97 mg/dL (ref 65–99)
POTASSIUM: 4 mmol/L (ref 3.5–5.1)
Sodium: 137 mmol/L (ref 135–145)
Total Bilirubin: 0.8 mg/dL (ref 0.3–1.2)
Total Protein: 6 g/dL — ABNORMAL LOW (ref 6.5–8.1)

## 2017-06-04 LAB — CBC
HCT: 37.2 % (ref 36.0–46.0)
Hemoglobin: 12 g/dL (ref 12.0–15.0)
MCH: 28.4 pg (ref 26.0–34.0)
MCHC: 32.3 g/dL (ref 30.0–36.0)
MCV: 88.2 fL (ref 78.0–100.0)
PLATELETS: 214 10*3/uL (ref 150–400)
RBC: 4.22 MIL/uL (ref 3.87–5.11)
RDW: 14.7 % (ref 11.5–15.5)
WBC: 6.8 10*3/uL (ref 4.0–10.5)

## 2017-06-04 LAB — TSH: TSH: 1.1 u[IU]/mL (ref 0.350–4.500)

## 2017-06-05 DIAGNOSIS — I63312 Cerebral infarction due to thrombosis of left middle cerebral artery: Secondary | ICD-10-CM | POA: Diagnosis not present

## 2017-06-05 DIAGNOSIS — Z5189 Encounter for other specified aftercare: Secondary | ICD-10-CM | POA: Diagnosis not present

## 2017-06-05 DIAGNOSIS — K625 Hemorrhage of anus and rectum: Secondary | ICD-10-CM | POA: Diagnosis not present

## 2017-06-05 DIAGNOSIS — I69320 Aphasia following cerebral infarction: Secondary | ICD-10-CM | POA: Diagnosis not present

## 2017-06-06 DIAGNOSIS — I69320 Aphasia following cerebral infarction: Secondary | ICD-10-CM | POA: Diagnosis not present

## 2017-06-06 DIAGNOSIS — K625 Hemorrhage of anus and rectum: Secondary | ICD-10-CM | POA: Diagnosis not present

## 2017-06-06 DIAGNOSIS — Z5189 Encounter for other specified aftercare: Secondary | ICD-10-CM | POA: Diagnosis not present

## 2017-06-06 DIAGNOSIS — I63312 Cerebral infarction due to thrombosis of left middle cerebral artery: Secondary | ICD-10-CM | POA: Diagnosis not present

## 2017-06-07 ENCOUNTER — Non-Acute Institutional Stay (SKILLED_NURSING_FACILITY): Payer: Medicare Other | Admitting: Internal Medicine

## 2017-06-07 ENCOUNTER — Encounter: Payer: Self-pay | Admitting: Internal Medicine

## 2017-06-07 DIAGNOSIS — Z5189 Encounter for other specified aftercare: Secondary | ICD-10-CM | POA: Diagnosis not present

## 2017-06-07 DIAGNOSIS — I5032 Chronic diastolic (congestive) heart failure: Secondary | ICD-10-CM

## 2017-06-07 DIAGNOSIS — H6122 Impacted cerumen, left ear: Secondary | ICD-10-CM

## 2017-06-07 DIAGNOSIS — I69320 Aphasia following cerebral infarction: Secondary | ICD-10-CM | POA: Diagnosis not present

## 2017-06-07 DIAGNOSIS — K625 Hemorrhage of anus and rectum: Secondary | ICD-10-CM | POA: Diagnosis not present

## 2017-06-07 DIAGNOSIS — I63312 Cerebral infarction due to thrombosis of left middle cerebral artery: Secondary | ICD-10-CM | POA: Diagnosis not present

## 2017-06-07 NOTE — Progress Notes (Signed)
Location:   [place] Room Number: 156/W Place of Service:  SNF [phone]) Provider:  Freddi Starr, MD  Patient Care Team: Virgie Dad, MD as PCP - General (Internal Medicine) Rolm Baptise as Physician Assistant (Internal Medicine)  Extended Emergency Contact Information Primary Emergency Contact: Adin Hector, [place] [address] [name] of [place] Phone: [phone] Relation: Son Secondary Emergency Contact: [name], [place] [address] [name] of [place] Phone: [phone] Mobile Phone: [phone] Relation: Son  Code Status:  DNR Goals of care: Advanced Directive information Advanced Directives 06/07/2017  Does Patient Have a Medical Advance Directive? Yes  Type of Advance Directive Out of facility DNR (pink MOST or yellow form)  Does patient want to make changes to medical advance directive? No - Patient declined  Would patient like information on creating a medical advance directive? No - Patient declined  Pre-existing out of facility DNR order (yellow form or pink MOST form) -     Chief Complaint  Patient presents with  . Acute Visit    Wax in ears    HPI:  Pt is a 83 y.o. female seen today for an acute visit forfollow-up last bilirubin her left ear.  She was seen by audiologist earlier this week and a hearing aid was placed in her right ear -- audiologist recommended the  Debroxbecause of wax buildup in the  left ear  She does complain of some decreased hearing in the left ear with a history of heart hearing as well at baseline.   Patient has h/o Atrial fibrillation on Eliquis, S/P Acute CVA involving left MCA ,ICA stenosis,Diastolic CHF, hyperlipidemia, And Hypertension And Arthritis with Chronic Knee and Back Pain.And recent right retinal vein occlussion    Past Medical History:  Diagnosis Date  . Atrial fibrillation ([place])   . Breast nodule 11/15/2014  .  Breast pain, left 11/15/2014  . Carotid artery disease ([place])   . Dizziness   . Dyspnea    Previous CPX suggesting possible restrictive physiology, respiratory muscle fatigue, diastolic dysfunction  . Essential hypertension, benign   . Hyperlipidemia   . Oxygen dependent    2 liter  . Rib pain on left side 11/15/2014  . Seasonal allergies   . Shingles 05/04/2015  . Toe fracture, left    left big toe   Past Surgical History:  Procedure Laterality Date  . COLONOSCOPY  02/22/2012   Procedure: COLONOSCOPY;  Surgeon: Rogene Houston, MD;  Location[facility];  Service: Endoscopy;  Laterality: N/A;  100  . KNEE SURGERY     bilateral    Allergies  Allergen Reactions  . Sulfur Swelling    Rash also per patient    Outpatient Encounter Prescriptions as of 06/07/2017  Medication Sig  . acetaminophen (TYLENOL) 500 MG tablet Take 1,000 mg by mouth 3 (three) times daily.  Jearl Klinefelter ELLIPTA 62.5-25 MCG/INH AEPB Inhale 1 Inhaler into the lungs daily.   Marland Kitchen apixaban (ELIQUIS) 2.5 MG TABS tablet Take 1 tablet (2.5 mg total) by mouth 2 (two) times daily.  Marland Kitchen aspirin 81 MG chewable tablet Chew 81 mg by mouth daily.  Marland Kitchen atorvastatin (LIPITOR) 80 MG tablet Take 1 tablet (80 mg total) by mouth daily at 6 PM.  . cycloSPORINE (RESTASIS) 0.05 % ophthalmic emulsion Place 1 drop into both eyes every 12 (twelve) hours.   Marland Kitchen  diclofenac sodium (VOLTAREN) 1 % GEL Apply 2 g topically 2 (two) times daily.  Marland Kitchen diltiazem (CARDIZEM CD) 180 MG 24 hr capsule TAKE ONE CAPSULE BY MOUTH ONCE DAILY.  . furosemide (LASIX) 20 MG tablet Take 20 mg by mouth every other day. Alternate  With 40 mg tablet  . furosemide (LASIX) 40 MG tablet Take 40 mg by mouth every other day. Alternate with 20 mg tablet  . Multiple Vitamin (MULITIVITAMIN WITH MINERALS) TABS Take 1 tablet by mouth every other day.   Marland Kitchen omeprazole (PRILOSEC) 20 MG capsule Take 20 mg by mouth daily.    . OXYGEN Inhale 2 L into the lungs daily. Oxygen @@ 2 L/mn via qhs.  Twice a day   . Polyethyl Glycol-Propyl Glycol (SYSTANE OP) Place 1 drop into both eyes 3 (three) times daily. For dry eyes  . polyethylene glycol (MIRALAX / GLYCOLAX) packet Take 17 g by mouth daily.  . potassium chloride (K-DUR,KLOR-CON) 10 MEQ tablet Take 20 mEq by mouth daily.  Marland Kitchen PROAIR HFA 108 (90 BASE) MCG/ACT inhaler 2 puffs 3 (three) times daily as needed for wheezing (cough).   . promethazine (PHENERGAN) 12.5 MG tablet Take 12.5 mg by mouth every 6 (six) hours as needed for nausea or vomiting.  . traMADol (ULTRAM) 50 MG tablet Give 50 mg by mouth once a morning. Take one tablet by mouth at bedtime 10pm and Take one tablet by mouth every 6 hours as needed for pain. Hold for sedation or respiratory distress   No facility-administered encounter medications on file as of 06/07/2017.     Review of Systems   General does not complaining fever or chills.  Head ears eyes nose mouth and  Again does complain of some difficulty hearing as noted above wax has been noted in her left ear per audiologist.  Respiratory is not complaining of shortness breath or cough.  Cardiac denies chest pain as her baseline lower extremity hematoma.  Neurologic does not complain of numbness dizziness headache  Immunization History  Administered Date(s) Administered  . Influenza-Unspecified 10/10/2013   Pertinent  Health Maintenance Due  Topic Date Due  . INFLUENZA VACCINE  11/09/2017 (Originally 07/10/2017)  . DEXA SCAN  11/09/2017 (Originally 01/15/1986)  . PNA vac Low Risk Adult (1 of 2 - PCV13) 11/09/2017 (Originally 01/15/1986)   No flowsheet data found. Functional Status Survey:     Pulse is 62 respirations 18 blood pressure 108 or 60 last noted.   Physical Exam   In general this is a pleasant elderly resident in no distress sitting comfortably in her wheelchair  Her skin is warm and dry.--   Ears she has a hearing aid in her right ea  r left ear has a large amount of dry wax that  occludes the tympanic membrane I do not see any evidence of bleeding or exudate or drainage .  Oropharynx clear mucous membranes moist.  Chest is clear to auscultation there is no labored breathing  Heart is irregular irregular rate and rhythm she hasbaseline  lower extremity edema -compression hose are applied bilaterally  Abdomen is soft nontender with positive bowel sounds.     MS- moves all extremities 4 with generalized frailty.  Neurologic is grossly intact her speech is clear could not really appreciate lateralizing findings. Her expressive aphasia p Which appears to be somewhat improved over time  Psych she appears grossly alert and oriented pleasant and appropriate low    Labs reviewed:  Recent Labs  05/08/17 0730 05/09/17  0720 06/04/17 0717  NA 137 137 137  K 4.5 4.2 4.0  CL 102 103 103  CO2 26 25 27   GLUCOSE 96 95 97  BUN 23* 27* 21*  CREATININE 0.72 0.78 0.71  CALCIUM 8.8* 8.9 8.6*    Recent Labs  12/24/16 0800 05/09/17 0720 06/04/17 0717  AST 28 23 19   ALT 16 16 13*  ALKPHOS 101 122 105  BILITOT 0.4 0.8 0.8  PROT 6.7 6.7 6.0*  ALBUMIN 3.7 3.6 3.3*    Recent Labs  05/08/17 0730 05/09/17 0720 05/10/17 0700 05/13/17 0715 06/04/17 0717  WBC 9.3 9.3 8.4 8.3 6.8  NEUTROABS 5.4 6.2  --  4.4  --   HGB 13.5 12.9 11.9* 13.1 12.0  HCT 41.0 39.9 36.6 40.8 37.2  MCV 87.6 87.7 87.6 89.1 88.2  PLT 227 230 199 232 214   Lab Results  Component Value Date   TSH 1.100 06/04/2017   Lab Results  Component Value Date   HGBA1C 5.5 12/06/2016   Lab Results  Component Value Date   CHOL 98 06/04/2017   HDL 50 06/04/2017   LDLCALC 40 06/04/2017   TRIG 40 06/04/2017   CHOLHDL 2.0 06/04/2017    Significant Diagnostic Results in last 30 days:  No results found.  Assessment/Plan  #1-wax buildup lshe does have significant buildup in the left ear will order D Brox 10 drops twice a day for 3 days and flush with warm water on day 4 she may  need more than one course   At this point will monitor.  #6-FKCLEXN chronic diastolic CHF she continues on Lasix she appears to be doing well weight appears to be relatively stable in the mid   1 40s-does not complain of increased shortness of breath-edema appears to be at baseline.  Renal functionpears stableon lab done 06/26/2018with a creatinine of 0.1 BUN of 21  [id number]

## 2017-06-10 DIAGNOSIS — Z5189 Encounter for other specified aftercare: Secondary | ICD-10-CM | POA: Diagnosis not present

## 2017-06-10 DIAGNOSIS — K625 Hemorrhage of anus and rectum: Secondary | ICD-10-CM | POA: Diagnosis not present

## 2017-06-10 DIAGNOSIS — I63312 Cerebral infarction due to thrombosis of left middle cerebral artery: Secondary | ICD-10-CM | POA: Diagnosis not present

## 2017-06-10 DIAGNOSIS — I69320 Aphasia following cerebral infarction: Secondary | ICD-10-CM | POA: Diagnosis not present

## 2017-06-11 DIAGNOSIS — I69320 Aphasia following cerebral infarction: Secondary | ICD-10-CM | POA: Diagnosis not present

## 2017-06-11 DIAGNOSIS — K625 Hemorrhage of anus and rectum: Secondary | ICD-10-CM | POA: Diagnosis not present

## 2017-06-11 DIAGNOSIS — Z5189 Encounter for other specified aftercare: Secondary | ICD-10-CM | POA: Diagnosis not present

## 2017-06-11 DIAGNOSIS — I63312 Cerebral infarction due to thrombosis of left middle cerebral artery: Secondary | ICD-10-CM | POA: Diagnosis not present

## 2017-06-12 DIAGNOSIS — I63312 Cerebral infarction due to thrombosis of left middle cerebral artery: Secondary | ICD-10-CM | POA: Diagnosis not present

## 2017-06-12 DIAGNOSIS — Z5189 Encounter for other specified aftercare: Secondary | ICD-10-CM | POA: Diagnosis not present

## 2017-06-12 DIAGNOSIS — I69320 Aphasia following cerebral infarction: Secondary | ICD-10-CM | POA: Diagnosis not present

## 2017-06-12 DIAGNOSIS — K625 Hemorrhage of anus and rectum: Secondary | ICD-10-CM | POA: Diagnosis not present

## 2017-06-13 DIAGNOSIS — K625 Hemorrhage of anus and rectum: Secondary | ICD-10-CM | POA: Diagnosis not present

## 2017-06-13 DIAGNOSIS — I69320 Aphasia following cerebral infarction: Secondary | ICD-10-CM | POA: Diagnosis not present

## 2017-06-13 DIAGNOSIS — I63312 Cerebral infarction due to thrombosis of left middle cerebral artery: Secondary | ICD-10-CM | POA: Diagnosis not present

## 2017-06-13 DIAGNOSIS — Z5189 Encounter for other specified aftercare: Secondary | ICD-10-CM | POA: Diagnosis not present

## 2017-06-14 DIAGNOSIS — Z5189 Encounter for other specified aftercare: Secondary | ICD-10-CM | POA: Diagnosis not present

## 2017-06-14 DIAGNOSIS — K625 Hemorrhage of anus and rectum: Secondary | ICD-10-CM | POA: Diagnosis not present

## 2017-06-14 DIAGNOSIS — I69320 Aphasia following cerebral infarction: Secondary | ICD-10-CM | POA: Diagnosis not present

## 2017-06-14 DIAGNOSIS — I63312 Cerebral infarction due to thrombosis of left middle cerebral artery: Secondary | ICD-10-CM | POA: Diagnosis not present

## 2017-06-17 DIAGNOSIS — I69320 Aphasia following cerebral infarction: Secondary | ICD-10-CM | POA: Diagnosis not present

## 2017-06-17 DIAGNOSIS — I63312 Cerebral infarction due to thrombosis of left middle cerebral artery: Secondary | ICD-10-CM | POA: Diagnosis not present

## 2017-06-17 DIAGNOSIS — K625 Hemorrhage of anus and rectum: Secondary | ICD-10-CM | POA: Diagnosis not present

## 2017-06-17 DIAGNOSIS — Z5189 Encounter for other specified aftercare: Secondary | ICD-10-CM | POA: Diagnosis not present

## 2017-06-18 DIAGNOSIS — H34811 Central retinal vein occlusion, right eye, with macular edema: Secondary | ICD-10-CM | POA: Diagnosis not present

## 2017-06-18 DIAGNOSIS — I63312 Cerebral infarction due to thrombosis of left middle cerebral artery: Secondary | ICD-10-CM | POA: Diagnosis not present

## 2017-06-18 DIAGNOSIS — Z5189 Encounter for other specified aftercare: Secondary | ICD-10-CM | POA: Diagnosis not present

## 2017-06-18 DIAGNOSIS — K625 Hemorrhage of anus and rectum: Secondary | ICD-10-CM | POA: Diagnosis not present

## 2017-06-18 DIAGNOSIS — I69320 Aphasia following cerebral infarction: Secondary | ICD-10-CM | POA: Diagnosis not present

## 2017-06-19 ENCOUNTER — Non-Acute Institutional Stay (SKILLED_NURSING_FACILITY): Payer: Medicare Other | Admitting: Internal Medicine

## 2017-06-19 ENCOUNTER — Encounter: Payer: Self-pay | Admitting: Internal Medicine

## 2017-06-19 ENCOUNTER — Encounter (HOSPITAL_COMMUNITY)
Admission: RE | Admit: 2017-06-19 | Discharge: 2017-06-19 | Disposition: A | Discharge status: Home / Self Care | Payer: Medicare Other | Source: Skilled Nursing Facility | Attending: Pulmonary Disease | Admitting: Pulmonary Disease

## 2017-06-19 ENCOUNTER — Ambulatory Visit (HOSPITAL_COMMUNITY): Payer: Medicare Other | Attending: Internal Medicine

## 2017-06-19 DIAGNOSIS — R52 Pain, unspecified: Secondary | ICD-10-CM | POA: Diagnosis not present

## 2017-06-19 DIAGNOSIS — I69320 Aphasia following cerebral infarction: Secondary | ICD-10-CM | POA: Diagnosis not present

## 2017-06-19 DIAGNOSIS — R601 Generalized edema: Secondary | ICD-10-CM | POA: Diagnosis not present

## 2017-06-19 DIAGNOSIS — M25561 Pain in right knee: Secondary | ICD-10-CM | POA: Diagnosis not present

## 2017-06-19 DIAGNOSIS — M6281 Muscle weakness (generalized): Secondary | ICD-10-CM | POA: Insufficient documentation

## 2017-06-19 DIAGNOSIS — Z5189 Encounter for other specified aftercare: Secondary | ICD-10-CM | POA: Diagnosis not present

## 2017-06-19 DIAGNOSIS — E785 Hyperlipidemia, unspecified: Secondary | ICD-10-CM | POA: Diagnosis not present

## 2017-06-19 DIAGNOSIS — R609 Edema, unspecified: Secondary | ICD-10-CM

## 2017-06-19 DIAGNOSIS — Z9981 Dependence on supplemental oxygen: Secondary | ICD-10-CM | POA: Diagnosis not present

## 2017-06-19 DIAGNOSIS — I5032 Chronic diastolic (congestive) heart failure: Secondary | ICD-10-CM | POA: Diagnosis not present

## 2017-06-19 DIAGNOSIS — Z96651 Presence of right artificial knee joint: Secondary | ICD-10-CM | POA: Diagnosis not present

## 2017-06-19 DIAGNOSIS — D649 Anemia, unspecified: Secondary | ICD-10-CM

## 2017-06-19 DIAGNOSIS — Z471 Aftercare following joint replacement surgery: Secondary | ICD-10-CM | POA: Diagnosis not present

## 2017-06-19 DIAGNOSIS — I63312 Cerebral infarction due to thrombosis of left middle cerebral artery: Secondary | ICD-10-CM | POA: Diagnosis not present

## 2017-06-19 DIAGNOSIS — K625 Hemorrhage of anus and rectum: Secondary | ICD-10-CM | POA: Diagnosis not present

## 2017-06-19 DIAGNOSIS — I517 Cardiomegaly: Secondary | ICD-10-CM | POA: Diagnosis not present

## 2017-06-19 DIAGNOSIS — R0602 Shortness of breath: Secondary | ICD-10-CM | POA: Diagnosis not present

## 2017-06-19 LAB — BASIC METABOLIC PANEL
ANION GAP: 10 (ref 5–15)
BUN: 22 mg/dL — ABNORMAL HIGH (ref 6–20)
CHLORIDE: 103 mmol/L (ref 101–111)
CO2: 24 mmol/L (ref 22–32)
Calcium: 8.6 mg/dL — ABNORMAL LOW (ref 8.9–10.3)
Creatinine, Ser: 0.81 mg/dL (ref 0.44–1.00)
GFR calc non Af Amer: 59 mL/min — ABNORMAL LOW (ref >60)
Glucose, Bld: 98 mg/dL (ref 65–99)
Potassium: 4.3 mmol/L (ref 3.5–5.1)
Sodium: 137 mmol/L (ref 135–145)

## 2017-06-19 LAB — CBC WITH DIFFERENTIAL/PLATELET
BASOS PCT: 0 %
Basophils Absolute: 0 10*3/uL (ref 0.0–0.1)
Eosinophils Absolute: 0 10*3/uL (ref 0.0–0.7)
Eosinophils Relative: 0 %
HEMATOCRIT: 31.1 % — AB (ref 36.0–46.0)
HEMOGLOBIN: 10.2 g/dL — AB (ref 12.0–15.0)
LYMPHS ABS: 1 10*3/uL (ref 0.7–4.0)
LYMPHS PCT: 11 %
MCH: 29 pg (ref 26.0–34.0)
MCHC: 32.8 g/dL (ref 30.0–36.0)
MCV: 88.4 fL (ref 78.0–100.0)
MONOS PCT: 10 %
Monocytes Absolute: 0.9 10*3/uL (ref 0.1–1.0)
NEUTROS ABS: 7.7 10*3/uL (ref 1.7–7.7)
NEUTROS PCT: 79 %
Platelets: 248 10*3/uL (ref 150–400)
RBC: 3.52 MIL/uL — ABNORMAL LOW (ref 3.87–5.11)
RDW: 14.4 % (ref 11.5–15.5)
WBC: 9.6 10*3/uL (ref 4.0–10.5)

## 2017-06-19 LAB — BRAIN NATRIURETIC PEPTIDE: B Natriuretic Peptide: 311 pg/mL — ABNORMAL HIGH (ref 0.0–100.0)

## 2017-06-19 LAB — SEDIMENTATION RATE: SED RATE: 18 mm/h (ref 0–22)

## 2017-06-19 NOTE — Progress Notes (Signed)
Location:   [facility] Room Number: 156/W Place of Service:  SNF [phone]) Provider:  Freddi Starr, MD  Patient Care Team: Virgie Dad, MD as PCP - General (Internal Medicine) Rolm Baptise as Physician Assistant (Internal Medicine)  Extended Emergency Contact Information Primary Emergency Contact: Adin Hector, [place] [address] [name] of [place] Phone: [phone] Relation: Son Secondary Emergency Contact: [name], [place] [address] [name] of [place] Phone: [phone] Mobile Phone: [phone] Relation: Son  Code Status:  DNR Goals of care: Advanced Directive information Advanced Directives 06/19/2017  Does Patient Have a Medical Advance Directive? Yes  Type of Advance Directive Out of facility DNR (pink MOST or yellow form)  Does patient want to make changes to medical advance directive? No - Patient declined  Would patient like information on creating a medical advance directive? No - Patient declined  Pre-existing out of facility DNR order (yellow form or pink MOST form) -     Chief Complaint  Patient presents with  . Acute Visit     Right knee pain  Also possible increased edema lower extremities  HPI:  Pt is a 83 y.o. female seen today for an acute visit for follow-up of right knee pain-as well as possible increased lower extremity edema.  Patient does have a significant history of right knee discomfort previous knee replacement-has been evaluated by orthopedics thought not to be a candidate for revision secondary to her age and comorbidities.  She also has a history of diastolic CHF-and is on Lasix alternating doses of 40 mg 1 mg a day-her nurse today feels she may have somewhat mildly increased lower extremity edema especially of her feet.  She is not complaining of any increased cough or shortness of breath.   In addition she also has a history of Atrial  fibrillation on Eliquis, S/P Acute CVA involving left MCA ,ICA stenosis,Diastolic CHF, hyperlipidemia, And Hypertension  She does receive tramadol for her knee pain apparently does does help some she has been very doesn't use any stronger pain medication.  Per review of weights appear she may have had some mild weight gain she is now 145, most recent weight previous baseline has been it appears around the 142 although there has been some variability  She also has a history of hemorrhoids and does have a GI consult pending for possible banding   Past Medical History:  Diagnosis Date  . Atrial fibrillation ([place])   . Breast nodule 11/15/2014  . Breast pain, left 11/15/2014  . Carotid artery disease ([place])   . Dizziness   . Dyspnea    Previous CPX suggesting possible restrictive physiology, respiratory muscle fatigue, diastolic dysfunction  . Essential hypertension, benign   . Hyperlipidemia   . Oxygen dependent    2 liter  . Rib pain on left side 11/15/2014  . Seasonal allergies   . Shingles 05/04/2015  . Toe fracture, left    left big toe   Past Surgical History:  Procedure Laterality Date  . COLONOSCOPY  02/22/2012   Procedure: COLONOSCOPY;  Surgeon: Rogene Houston, MD;  Location: [facility];  Service: Endoscopy;  Laterality: N/A;  100  . KNEE SURGERY     bilateral    Allergies  Allergen Reactions  . Sulfur Swelling    Rash also per patient    Outpatient Encounter  Prescriptions as of 06/19/2017  Medication Sig  . acetaminophen (TYLENOL) 500 MG tablet Take 1,000 mg by mouth 3 (three) times daily.  Jearl Klinefelter ELLIPTA 62.5-25 MCG/INH AEPB Inhale 1 Inhaler into the lungs daily.   Marland Kitchen apixaban (ELIQUIS) 2.5 MG TABS tablet Take 1 tablet (2.5 mg total) by mouth 2 (two) times daily.  Marland Kitchen aspirin 81 MG chewable tablet Chew 81 mg by mouth daily.  Marland Kitchen atorvastatin (LIPITOR) 80 MG tablet Take 1 tablet (80 mg total) by mouth daily at 6 PM.  . cycloSPORINE (RESTASIS) 0.05 % ophthalmic emulsion  Place 1 drop into both eyes every 12 (twelve) hours.   . diclofenac sodium (VOLTAREN) 1 % GEL Apply 2 g topically 2 (two) times daily.  Marland Kitchen diltiazem (CARDIZEM CD) 180 MG 24 hr capsule TAKE ONE CAPSULE BY MOUTH ONCE DAILY.  . furosemide (LASIX) 20 MG tablet Take 20 mg by mouth every other day. Alternate  With 40 mg tablet  . furosemide (LASIX) 40 MG tablet Take 40 mg by mouth every other day. Alternate with 20 mg tablet  . hydrocortisone (ANUSOL-HC) 2.5 % rectal cream Place 1 application rectally 2 (two) times daily as needed for hemorrhoids or anal itching.  . Multiple Vitamin (MULITIVITAMIN WITH MINERALS) TABS Take 1 tablet by mouth every other day.   Marland Kitchen omeprazole (PRILOSEC) 20 MG capsule Take 20 mg by mouth daily.    . OXYGEN Inhale 2 L into the lungs daily. Oxygen @@ 2 L/mn via qhs. Twice a day   . Polyethyl Glycol-Propyl Glycol (SYSTANE OP) Place 1 drop into both eyes 3 (three) times daily. For dry eyes  . polyethylene glycol (MIRALAX / GLYCOLAX) packet Take 17 g by mouth daily.  . potassium chloride (K-DUR,KLOR-CON) 10 MEQ tablet Take 20 mEq by mouth daily.  Marland Kitchen PROAIR HFA 108 (90 BASE) MCG/ACT inhaler 2 puffs 3 (three) times daily as needed for wheezing (cough).   . promethazine (PHENERGAN) 12.5 MG tablet Take 12.5 mg by mouth every 6 (six) hours as needed for nausea or vomiting.  . traMADol (ULTRAM) 50 MG tablet Give 50 mg by mouth once a morning. Take one tablet by mouth at bedtime 10pm and Take one tablet by mouth every 6 hours as needed for pain. Hold for sedation or respiratory distress   No facility-administered encounter medications on file as of 06/19/2017.     Review of Systems General does not complain fever or chills-  Skin  Does not complain of any increased bruising or rashes  Head ears eyes nose mouth and throat is positive for hearing loss is not complaining of sore throat or runny nose or visual changes from baseline. Has had some blurriness of right eye with history of  retinal vein occlusion followed by ophthalmology  Respiratory does not complain of shortness breath or cough does have a history of COPD.  Cardiac does not complaining of chest pain has some lower extremity edema which may be mildly increased she has compression hose on  GI is not complaining of any nausea vomiting diarrhea constipation or abdominal discomfort.  Rectal d Does not complaining currently of rectal pain but has had hemorrhoid pain past and apparently this occasionally stillL flares up  GU is not complaining of dysuria.  Muscle skeletal does have a history of chronic pain more so her back--knee complained of more significant right knee pain today did not complain of any trauma  Neurologic does have some element of expressive aphasia does not complain of headache dizziness syncope.-aphasiaappears  to be improving  Psych does not complain of overt anxiety or depression appears to be doing well with supportive care      Immunization History    Immunization History  Administered Date(s) Administered  . Influenza-Unspecified 10/10/2013   Pertinent  Health Maintenance Due  Topic Date Due  . INFLUENZA VACCINE  11/09/2017 (Originally 07/10/2017)  . DEXA SCAN  11/09/2017 (Originally 01/15/1986)  . PNA vac Low Risk Adult (1 of 2 - PCV13) 11/09/2017 (Originally [birth year]/15/1986)   No flowsheet data found. Functional Status Survey:    Vitals:   06/19/17 1431  BP: 136/78  Pulse: 86  Resp: 20  Temp: 97.8 F (36.6 C)  TempSrc: Oral  Weight is 145 pounds  Physical Exam In general this is a pleasant elderly resident in no distress sitting comfortably in her wheelchair  Her skin is warm and dry.-  Eyes visual acuity appears grossly intact does have some history of blurriness of right eye as noted above   Oropharynx clear mucous membranes moist.  Chest is clear to auscultation there is no labored breathing  Heart is irregular irregular rate and rhythm she has on-2  plus lower extremity edema most noticeable on her feet today -compression hose are applied bilaterally  Abdomen is soft nontender with positive bowel sounds.     MS- moves all extremities 4 but has some increased pain it appears of her right knee area there is edema of her right knee area is slightly warm flesh-colored- discomfort with flexion and extension of the knee which she has limited mobility baseline.  Left knee has some mild edema which appears chronic.    Neurologic is grossly intact her speech is clear could not really appreciate lateralizing findings. Her expressive aphasia persists but appears to have gradually improved   Psych she appears grossly alert and oriented pleasant and appropriate he Labs reviewed:   Recent Labs  05/09/17 0720 06/04/17 0717 06/19/17 1230  NA 137 137 137  K 4.2 4.0 4.3  CL 103 103 103  CO2 25 27 24   GLUCOSE 95 97 98  BUN 27* 21* 22*  CREATININE 0.78 0.71 0.81  CALCIUM 8.9 8.6* 8.6*    Recent Labs  12/24/16 0800 05/09/17 0720 06/04/17 0717  AST 28 23 19   ALT 16 16 13*  ALKPHOS 101 122 105  BILITOT 0.4 0.8 0.8  PROT 6.7 6.7 6.0*  ALBUMIN 3.7 3.6 3.3*    Recent Labs  05/09/17 0720  05/13/17 0715 06/04/17 0717 06/19/17 1230  WBC 9.3  < > 8.3 6.8 9.6  NEUTROABS 6.2  --  4.4  --  7.7  HGB 12.9  < > 13.1 12.0 10.2*  HCT 39.9  < > 40.8 37.2 31.1*  MCV 87.7  < > 89.1 88.2 88.4  PLT 230  < > 232 214 248  < > = values in this interval not displayed. Lab Results  Component Value Date   TSH 1.100 06/04/2017   Lab Results  Component Value Date   HGBA1C 5.5 12/06/2016   Lab Results  Component Value Date   CHOL 98 06/04/2017   HDL 50 06/04/2017   LDLCALC 40 06/04/2017   TRIG 40 06/04/2017   CHOLHDL 2.0 06/04/2017    Significant Diagnostic Results in last 30 days:  No results found.  Assessment/Plan T #1-history of increased right knee pain-she does have an order for tramadol 50 mg twice a day and every 6  hours when necessary also want care and gel is being  encouraged- continues to not want anything stronger  We'll order x-ray of the area for reevaluation-again thought to be a good surgical candidate for any intervention status post previous knee replacement.  Also will obtain a sedimentation rate  Also will order a venous Doppler of the lower extremities although my suspicion of a DVT  is quite low she is on Eliquis-with a history of atrial fibrillation.  #2-history of increased lower extremity edema-she does have a history of diastolic CHF-cardiac echo showed an ejection fraction of 65% back in 2016.  She is alternating doses of Lasix 40--20 mg a day- has had a mild weight gain -- renal function appears to be stable will increase her Lasix to 40 mg every day--also will update a metabolic panel to ensure stability-also will obtain a BNP   Addendum-we have obtained results of the lab work which appears unremarkable except for hemoglobin down somewhat from baseline at 10.2 previous baseline been more in the higher 11's-13.  Metabolic panel showed stability with a creatinine of 0.81 BUN of 22 sodium 137 potassium 4.3.  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  This was discussed with Dr. Lyndel Safe via phone and will order an orthopedic follow-up as soon as possible-again she does not want change in her pain medications this time.  In regards to the hemoglobin somewhat lower than her recent baseline-I did guaiac her stool was positive she does have some history of guaiac-positive stool past.  She is on a proton pump inhibitor she also has a history of hemorrhoids bleed at times which could have contributed to the positive findings-she will need GI follow-up which is been ordered and also will update a CBC next week to ensure stability.  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 Lyndel Safe via phone as well   [id number] note greater than 50 minutes spent assessing patient-reassessing patient-reviewing her charts her labs radiology studies-and discussing her status extensively with nursing as well as with Dr. Lyndel Safe via phone-of note greater than 50% of time spent coordinating plan of care for numerous diagnoses

## 2017-06-20 ENCOUNTER — Other Ambulatory Visit (HOSPITAL_COMMUNITY)
Admission: RE | Admit: 2017-06-20 | Discharge: 2017-06-20 | Disposition: A | Discharge status: Home / Self Care | Payer: Medicare Other | Source: Skilled Nursing Facility | Attending: Internal Medicine | Admitting: Internal Medicine

## 2017-06-20 DIAGNOSIS — I63312 Cerebral infarction due to thrombosis of left middle cerebral artery: Secondary | ICD-10-CM | POA: Diagnosis not present

## 2017-06-20 DIAGNOSIS — K625 Hemorrhage of anus and rectum: Secondary | ICD-10-CM | POA: Diagnosis not present

## 2017-06-20 DIAGNOSIS — M1990 Unspecified osteoarthritis, unspecified site: Secondary | ICD-10-CM | POA: Diagnosis not present

## 2017-06-20 DIAGNOSIS — I69320 Aphasia following cerebral infarction: Secondary | ICD-10-CM | POA: Diagnosis not present

## 2017-06-20 DIAGNOSIS — Z5189 Encounter for other specified aftercare: Secondary | ICD-10-CM | POA: Insufficient documentation

## 2017-06-20 LAB — BASIC METABOLIC PANEL
Anion gap: 7 (ref 5–15)
BUN: 23 mg/dL — AB (ref 6–20)
CHLORIDE: 103 mmol/L (ref 101–111)
CO2: 27 mmol/L (ref 22–32)
CREATININE: 0.81 mg/dL (ref 0.44–1.00)
Calcium: 8.8 mg/dL — ABNORMAL LOW (ref 8.9–10.3)
GFR calc Af Amer: >60 mL/min (ref >60)
GFR calc non Af Amer: 59 mL/min — ABNORMAL LOW (ref >60)
Glucose, Bld: 97 mg/dL (ref 65–99)
POTASSIUM: 3.9 mmol/L (ref 3.5–5.1)
SODIUM: 137 mmol/L (ref 135–145)

## 2017-06-21 DIAGNOSIS — I69320 Aphasia following cerebral infarction: Secondary | ICD-10-CM | POA: Diagnosis not present

## 2017-06-21 DIAGNOSIS — K625 Hemorrhage of anus and rectum: Secondary | ICD-10-CM | POA: Diagnosis not present

## 2017-06-21 DIAGNOSIS — I63312 Cerebral infarction due to thrombosis of left middle cerebral artery: Secondary | ICD-10-CM | POA: Diagnosis not present

## 2017-06-21 DIAGNOSIS — Z5189 Encounter for other specified aftercare: Secondary | ICD-10-CM | POA: Diagnosis not present

## 2017-06-24 ENCOUNTER — Ambulatory Visit (HOSPITAL_COMMUNITY): Payer: Medicare Other

## 2017-06-24 ENCOUNTER — Ambulatory Visit (HOSPITAL_COMMUNITY)
Admit: 2017-06-24 | Discharge: 2017-06-24 | Disposition: A | Discharge status: Home / Self Care | Payer: Medicare Other | Source: Ambulatory Visit | Attending: Internal Medicine | Admitting: Internal Medicine

## 2017-06-24 DIAGNOSIS — M75102 Unspecified rotator cuff tear or rupture of left shoulder, not specified as traumatic: Secondary | ICD-10-CM | POA: Diagnosis not present

## 2017-06-24 DIAGNOSIS — K625 Hemorrhage of anus and rectum: Secondary | ICD-10-CM | POA: Diagnosis not present

## 2017-06-24 DIAGNOSIS — M8580 Other specified disorders of bone density and structure, unspecified site: Secondary | ICD-10-CM | POA: Insufficient documentation

## 2017-06-24 DIAGNOSIS — I69320 Aphasia following cerebral infarction: Secondary | ICD-10-CM | POA: Diagnosis not present

## 2017-06-24 DIAGNOSIS — M7989 Other specified soft tissue disorders: Secondary | ICD-10-CM | POA: Diagnosis not present

## 2017-06-24 DIAGNOSIS — M75101 Unspecified rotator cuff tear or rupture of right shoulder, not specified as traumatic: Secondary | ICD-10-CM | POA: Insufficient documentation

## 2017-06-24 DIAGNOSIS — R600 Localized edema: Secondary | ICD-10-CM | POA: Diagnosis not present

## 2017-06-24 DIAGNOSIS — Z5189 Encounter for other specified aftercare: Secondary | ICD-10-CM | POA: Diagnosis not present

## 2017-06-24 DIAGNOSIS — T84032A Mechanical loosening of internal right knee prosthetic joint, initial encounter: Secondary | ICD-10-CM | POA: Diagnosis not present

## 2017-06-24 DIAGNOSIS — I517 Cardiomegaly: Secondary | ICD-10-CM | POA: Diagnosis not present

## 2017-06-24 DIAGNOSIS — Y838 Other surgical procedures as the cause of abnormal reaction of the patient, or of later complication, without mention of misadventure at the time of the procedure: Secondary | ICD-10-CM | POA: Diagnosis not present

## 2017-06-24 DIAGNOSIS — R609 Edema, unspecified: Secondary | ICD-10-CM | POA: Diagnosis not present

## 2017-06-24 DIAGNOSIS — I63312 Cerebral infarction due to thrombosis of left middle cerebral artery: Secondary | ICD-10-CM | POA: Diagnosis not present

## 2017-06-24 LAB — CBC WITH DIFFERENTIAL/PLATELET
BASOS ABS: 0 10*3/uL (ref 0.0–0.1)
BASOS PCT: 0 %
Eosinophils Absolute: 0.2 10*3/uL (ref 0.0–0.7)
Eosinophils Relative: 3 %
HCT: 29.3 % — ABNORMAL LOW (ref 36.0–46.0)
Hemoglobin: 9.5 g/dL — ABNORMAL LOW (ref 12.0–15.0)
LYMPHS PCT: 27 %
Lymphs Abs: 2 10*3/uL (ref 0.7–4.0)
MCH: 28.6 pg (ref 26.0–34.0)
MCHC: 32.4 g/dL (ref 30.0–36.0)
MCV: 88.3 fL (ref 78.0–100.0)
Monocytes Absolute: 1 10*3/uL (ref 0.1–1.0)
Monocytes Relative: 14 %
NEUTROS PCT: 56 %
Neutro Abs: 4.1 10*3/uL (ref 1.7–7.7)
Platelets: 256 10*3/uL (ref 150–400)
RBC: 3.32 MIL/uL — AB (ref 3.87–5.11)
RDW: 13.9 % (ref 11.5–15.5)
WBC: 7.3 10*3/uL (ref 4.0–10.5)

## 2017-06-24 LAB — BASIC METABOLIC PANEL
ANION GAP: 6 (ref 5–15)
BUN: 24 mg/dL — ABNORMAL HIGH (ref 6–20)
CO2: 28 mmol/L (ref 22–32)
Calcium: 8.3 mg/dL — ABNORMAL LOW (ref 8.9–10.3)
Chloride: 104 mmol/L (ref 101–111)
Creatinine, Ser: 0.73 mg/dL (ref 0.44–1.00)
GFR calc non Af Amer: >60 mL/min (ref >60)
Glucose, Bld: 90 mg/dL (ref 65–99)
POTASSIUM: 4.1 mmol/L (ref 3.5–5.1)
Sodium: 138 mmol/L (ref 135–145)

## 2017-06-25 ENCOUNTER — Encounter: Payer: Self-pay | Admitting: Orthopedic Surgery

## 2017-06-25 ENCOUNTER — Ambulatory Visit (INDEPENDENT_AMBULATORY_CARE_PROVIDER_SITE_OTHER): Payer: Medicare Other | Admitting: Orthopedic Surgery

## 2017-06-25 ENCOUNTER — Telehealth: Payer: Self-pay | Admitting: Gastroenterology

## 2017-06-25 DIAGNOSIS — I69320 Aphasia following cerebral infarction: Secondary | ICD-10-CM | POA: Diagnosis not present

## 2017-06-25 DIAGNOSIS — M5431 Sciatica, right side: Secondary | ICD-10-CM | POA: Diagnosis not present

## 2017-06-25 DIAGNOSIS — Z5189 Encounter for other specified aftercare: Secondary | ICD-10-CM | POA: Diagnosis not present

## 2017-06-25 DIAGNOSIS — I63312 Cerebral infarction due to thrombosis of left middle cerebral artery: Secondary | ICD-10-CM | POA: Diagnosis not present

## 2017-06-25 DIAGNOSIS — K625 Hemorrhage of anus and rectum: Secondary | ICD-10-CM | POA: Diagnosis not present

## 2017-06-25 MED ORDER — GABAPENTIN 100 MG PO CAPS: 100.0000 mg | ORAL_CAPSULE | Freq: Three times a day (TID) | ORAL | 2 refills | Status: DC

## 2017-06-25 NOTE — Telephone Encounter (Signed)
Received request for consult for possible hemorrhoid banding from Deberah Castle, NP/Dr. Hildred Laser.    Reviewed records. 83 year old wheelchair-bound female on Eliquis for history of A. fib, stroke in December 2017. Evaluated in June 2018 by Deberah Castle, NP and noted to have thrombosed hemorrhoid, topical hemorrhoid treatment recommended. Last colonoscopy 2013 for rectal bleeding, small hemorrhoids below the dentate line, small AVM next to the appendiceal orifice without stigmata of bleeding, scar at the ileocecal valve indicative of healed ulcer likely secondary to NSAID therapy. Numerous diverticula seen.  Patient had normal hemoglobin of 12 g/dL on 06/04/2017. Hemoglobin down to 9.5 yesterday. Heme positive 05/09/2017 as well as 06/19/2017.  At this time it appears patient has had decline in her Hgb without overt GI bleeding but with heme positive stools. Would be concerned of occult GI bleeding from source other then hemorrhoidal. Likely needs additional GI evaluation from her primary gastroenterologist at this time as opposed to hemorrhoid banding. Hemorrhoid banding would be difficult in this patient given need for long-term Eliquis, requirement of holding blood thinners for 2-3 weeks after each banding session, which may not be ideal for this patient with history of A. fib and stroke.    TERRI, PATIENT MAY NEED ADDITIONAL WORK UP FROM YOU GUYS AS OUTLINED ABOVE.

## 2017-06-25 NOTE — Progress Notes (Signed)
Routine follow-up office visit  Chief Complaint  Patient presents with  . Follow-up    RECURRING RIGHT KNEE PAIN    This 83 year old female had right total knee done in [place] many years ago the knee failed she has a loose prosthesis. We saw her before and recommended revision except for the fact that she is 83 years old with multiple medical problems and recent history of stroke as well as being on the [facility] was  She presents with one-week history of severe right leg pain which initially was thought to be right knee pain but workup included x-ray and ultrasound were negative. The patient was in tears for 2 or 3 days despite being on Tylenol and tramadol. She does give a good history of posterior leg pain radiating down into her foot which has been severe intermittent comes today or night and is relieved by flexing her hip and knee.      Review of Systems  Constitutional: Positive for malaise/fatigue.  Gastrointestinal: Positive for blood in stool.  Musculoskeletal: Positive for back pain.  Neurological: Negative for tingling and sensory change.   No current outpatient prescriptions on file.  Current Meds  Medication Sig  . acetaminophen (TYLENOL) 500 MG tablet Take 1,000 mg by mouth 3 (three) times daily.  Jearl Klinefelter ELLIPTA 62.5-25 MCG/INH AEPB Inhale 1 Inhaler into the lungs daily.   Marland Kitchen apixaban (ELIQUIS) 2.5 MG TABS tablet Take 1 tablet (2.5 mg total) by mouth 2 (two) times daily.  Marland Kitchen aspirin 81 MG chewable tablet Chew 81 mg by mouth daily.  Marland Kitchen atorvastatin (LIPITOR) 80 MG tablet Take 1 tablet (80 mg total) by mouth daily at 6 PM.  . cycloSPORINE (RESTASIS) 0.05 % ophthalmic emulsion Place 1 drop into both eyes every 12 (twelve) hours.   . diclofenac sodium (VOLTAREN) 1 % GEL Apply 2 g topically 2 (two) times daily.  Marland Kitchen diltiazem (CARDIZEM CD) 180 MG 24 hr capsule TAKE ONE CAPSULE BY MOUTH ONCE DAILY.  . furosemide (LASIX) 20 MG tablet Take 20 mg by mouth every other day. Alternate   With 40 mg tablet  . furosemide (LASIX) 40 MG tablet Take 40 mg by mouth every other day. Alternate with 20 mg tablet  . hydrocortisone (ANUSOL-HC) 2.5 % rectal cream Place 1 application rectally 2 (two) times daily as needed for hemorrhoids or anal itching.  . Multiple Vitamin (MULITIVITAMIN WITH MINERALS) TABS Take 1 tablet by mouth every other day.   Marland Kitchen omeprazole (PRILOSEC) 20 MG capsule Take 20 mg by mouth daily.    . OXYGEN Inhale 2 L into the lungs daily. Oxygen @@ 2 L/mn via qhs. Twice a day   . Polyethyl Glycol-Propyl Glycol (SYSTANE OP) Place 1 drop into both eyes 3 (three) times daily. For dry eyes  . polyethylene glycol (MIRALAX / GLYCOLAX) packet Take 17 g by mouth daily.  . potassium chloride (K-DUR,KLOR-CON) 10 MEQ tablet Take 20 mEq by mouth daily.  Marland Kitchen PROAIR HFA 108 (90 BASE) MCG/ACT inhaler 2 puffs 3 (three) times daily as needed for wheezing (cough).   . promethazine (PHENERGAN) 12.5 MG tablet Take 12.5 mg by mouth every 6 (six) hours as needed for nausea or vomiting.  . traMADol (ULTRAM) 50 MG tablet Give 50 mg by mouth once a morning. Take one tablet by mouth at bedtime 10pm and Take one tablet by mouth every 6 hours as needed for pain. Hold for sedation or respiratory distress   Past Medical History:  Diagnosis Date  . Atrial  fibrillation ([place])   . Breast nodule 11/15/2014  . Breast pain, left 11/15/2014  . Carotid artery disease ([place])   . Dizziness   . Dyspnea    Previous CPX suggesting possible restrictive physiology, respiratory muscle fatigue, diastolic dysfunction  . Essential hypertension, benign   . Hyperlipidemia   . Oxygen dependent    2 liter  . Rib pain on left side 11/15/2014  . Seasonal allergies   . Shingles 05/04/2015  . Toe fracture, left    left big toe    The patient is brought in today in a wheelchair she looks to be well She is oriented to person place and time she has a speech impediment from stroke but with careful questioning inpatient she is  able to communicate very well her mood is pleasant her affect is flat  Her gait is remarkable for needing a full assistance in and out of the wheelchair  Her right knee has a midline incision it is swollen and is slightly warm to touch her knee sits in flexion and is easily ranged out to -10 extension it feels unstable in all planes her muscle tone is normal her skin is intact she has mild distal swelling cysts sensation seems normal  Tenderness is in the dorsum of the foot lateral portion of the leg and posterior part of the thigh towards the buttock on the right  Her left side shows a normal incision of total knee and 100 of knee flexion   XRAYS: LOOSE PROSTHESIS   Encounter Diagnosis  Name Primary?  . Sciatica of right side Yes    Recommend 100 mg a gabapentin 3 times a day

## 2017-06-26 ENCOUNTER — Telehealth (INDEPENDENT_AMBULATORY_CARE_PROVIDER_SITE_OTHER): Payer: Self-pay | Admitting: Internal Medicine

## 2017-06-26 DIAGNOSIS — I63312 Cerebral infarction due to thrombosis of left middle cerebral artery: Secondary | ICD-10-CM | POA: Diagnosis not present

## 2017-06-26 DIAGNOSIS — K625 Hemorrhage of anus and rectum: Secondary | ICD-10-CM

## 2017-06-26 DIAGNOSIS — I69320 Aphasia following cerebral infarction: Secondary | ICD-10-CM | POA: Diagnosis not present

## 2017-06-26 DIAGNOSIS — Z5189 Encounter for other specified aftercare: Secondary | ICD-10-CM | POA: Diagnosis not present

## 2017-06-26 NOTE — Telephone Encounter (Signed)
We need to repeat H&H in determine the next. Agree with holding off hemorrhoidal banding etc.

## 2017-06-26 NOTE — Telephone Encounter (Signed)
Noted. I did not know she had a drop in her hemoglobin.  I have asked Dr. Laural Golden to look at her chart. Thanks, Karna Christmas

## 2017-06-26 NOTE — Telephone Encounter (Signed)
Discussed with Terri.

## 2017-06-26 NOTE — Telephone Encounter (Signed)
Will get a CBC in 1 week. Will have [place] fax order to [facility]

## 2017-06-26 NOTE — Telephone Encounter (Signed)
Wll fax order for repeat CBC in 1 week.s

## 2017-06-26 NOTE — Telephone Encounter (Signed)
CBC ordered. [facility] will fax order to [facility]. Will get CBC in 1 week.

## 2017-06-27 ENCOUNTER — Ambulatory Visit (INDEPENDENT_AMBULATORY_CARE_PROVIDER_SITE_OTHER): Payer: Self-pay | Admitting: Internal Medicine

## 2017-06-28 DIAGNOSIS — I69320 Aphasia following cerebral infarction: Secondary | ICD-10-CM | POA: Diagnosis not present

## 2017-06-28 DIAGNOSIS — Z5189 Encounter for other specified aftercare: Secondary | ICD-10-CM | POA: Diagnosis not present

## 2017-06-28 DIAGNOSIS — I63312 Cerebral infarction due to thrombosis of left middle cerebral artery: Secondary | ICD-10-CM | POA: Diagnosis not present

## 2017-06-28 DIAGNOSIS — K625 Hemorrhage of anus and rectum: Secondary | ICD-10-CM | POA: Diagnosis not present

## 2017-07-01 ENCOUNTER — Encounter (HOSPITAL_COMMUNITY)
Admission: RE | Admit: 2017-07-01 | Discharge: 2017-07-01 | Disposition: A | Discharge status: Home / Self Care | Payer: Medicare Other | Source: Skilled Nursing Facility | Attending: Internal Medicine | Admitting: Internal Medicine

## 2017-07-01 ENCOUNTER — Encounter: Payer: Self-pay | Admitting: Internal Medicine

## 2017-07-01 DIAGNOSIS — I63312 Cerebral infarction due to thrombosis of left middle cerebral artery: Secondary | ICD-10-CM | POA: Diagnosis not present

## 2017-07-01 DIAGNOSIS — I69320 Aphasia following cerebral infarction: Secondary | ICD-10-CM | POA: Diagnosis not present

## 2017-07-01 DIAGNOSIS — Z5189 Encounter for other specified aftercare: Secondary | ICD-10-CM | POA: Diagnosis not present

## 2017-07-01 DIAGNOSIS — K625 Hemorrhage of anus and rectum: Secondary | ICD-10-CM | POA: Diagnosis not present

## 2017-07-01 LAB — CBC
HCT: 27.8 % — ABNORMAL LOW (ref 36.0–46.0)
Hemoglobin: 8.8 g/dL — ABNORMAL LOW (ref 12.0–15.0)
MCH: 27.8 pg (ref 26.0–34.0)
MCHC: 31.7 g/dL (ref 30.0–36.0)
MCV: 87.7 fL (ref 78.0–100.0)
PLATELETS: 265 10*3/uL (ref 150–400)
RBC: 3.17 MIL/uL — AB (ref 3.87–5.11)
RDW: 13.6 % (ref 11.5–15.5)
WBC: 7.2 10*3/uL (ref 4.0–10.5)

## 2017-07-01 NOTE — Telephone Encounter (Signed)
A CBC has been ordered. Son came by office. Ordered last week.

## 2017-07-01 NOTE — Telephone Encounter (Signed)
Routing to [place]  

## 2017-07-01 NOTE — Telephone Encounter (Signed)
Routing encounter to [place] office to call the patient

## 2017-07-01 NOTE — Telephone Encounter (Signed)
Patient son came by office concerned because he has not heard from neither here or the [facility] about what is going to be done about the patient. He went to the [facility] because they also called stating he was concerned about the status of the referral.

## 2017-07-01 NOTE — Progress Notes (Signed)
Location:   [facility] Room Number: 156/W Place of Service:  SNF [phone]) Provider:  Kyung Rudd, Rene Kocher, MD  Patient Care Team: Virgie Dad, MD as PCP - General (Internal Medicine) Rolm Baptise as Physician Assistant (Internal Medicine)  Extended Emergency Contact Information Primary Emergency Contact: Adin Hector, [place] [address] [name] of [place] Phone: [phone] Relation: Son Secondary Emergency Contact: [name], [place] [address] [name] of [place] Phone: [phone] Mobile Phone: [phone] Relation: Son  Code Status: DNR  Goals of care: Advanced Directive information Advanced Directives 07/01/2017  Does Patient Have a Medical Advance Directive? Yes  Type of Advance Directive Out of facility DNR (pink MOST or yellow form)  Does patient want to make changes to medical advance directive? No - Patient declined  Would patient like information on creating a medical advance directive? No - Patient declined  Pre-existing out of facility DNR order (yellow form or pink MOST form) -     Chief Complaint  Patient presents with  . Acute Visit    Anemia and Gi Bleed    HPI:  Pt is a 83 y.o. female seen today for an acute visit for    Past Medical History:  Diagnosis Date  . Atrial fibrillation ([place])   . Breast nodule 11/15/2014  . Breast pain, left 11/15/2014  . Carotid artery disease ([place])   . Dizziness   . Dyspnea    Previous CPX suggesting possible restrictive physiology, respiratory muscle fatigue, diastolic dysfunction  . Essential hypertension, benign   . Hyperlipidemia   . Oxygen dependent    2 liter  . Rib pain on left side 11/15/2014  . Seasonal allergies   . Shingles 05/04/2015  . Toe fracture, left    left big toe   Past Surgical History:  Procedure Laterality Date  . COLONOSCOPY  02/22/2012   Procedure: COLONOSCOPY;  Surgeon: Rogene Houston, MD;   Location: [facility];  Service: Endoscopy;  Laterality: N/A;  100  . KNEE SURGERY     bilateral    Allergies  Allergen Reactions  . Sulfur Swelling    Rash also per patient    Outpatient Encounter Prescriptions as of 07/01/2017  Medication Sig  . acetaminophen (TYLENOL) 500 MG tablet Take 1,000 mg by mouth 3 (three) times daily.  Jearl Klinefelter ELLIPTA 62.5-25 MCG/INH AEPB Inhale 1 Inhaler into the lungs daily.   Marland Kitchen apixaban (ELIQUIS) 2.5 MG TABS tablet Take 1 tablet (2.5 mg total) by mouth 2 (two) times daily.  Marland Kitchen aspirin 81 MG chewable tablet Chew 81 mg by mouth daily.  Marland Kitchen atorvastatin (LIPITOR) 80 MG tablet Take 1 tablet (80 mg total) by mouth daily at 6 PM.  . cycloSPORINE (RESTASIS) 0.05 % ophthalmic emulsion Place 1 drop into both eyes every 12 (twelve) hours.   . diclofenac sodium (VOLTAREN) 1 % GEL Apply 2 g topically 2 (two) times daily.  Marland Kitchen diltiazem (CARDIZEM CD) 180 MG 24 hr capsule TAKE ONE CAPSULE BY MOUTH ONCE DAILY.  . furosemide (LASIX) 40 MG tablet Take 40 mg by mouth daily.   Marland Kitchen gabapentin (NEURONTIN) 100 MG capsule Take 1 capsule (100 mg total) by mouth 3 (three) times daily.  . hydrocortisone (ANUSOL-HC) 2.5 % rectal cream Place 1 application rectally 2 (two) times daily as needed for hemorrhoids or anal itching.  . Multiple Vitamin (MULITIVITAMIN  WITH MINERALS) TABS Take 1 tablet by mouth every other day.   Marland Kitchen omeprazole (PRILOSEC) 20 MG capsule Take 20 mg by mouth daily.    . OXYGEN Inhale 2 L into the lungs daily. Oxygen @@ 2 L/mn via qhs. Twice a day   . Polyethyl Glycol-Propyl Glycol (SYSTANE OP) Place 1 drop into both eyes 3 (three) times daily. For dry eyes  . polyethylene glycol (MIRALAX / GLYCOLAX) packet Take 17 g by mouth daily.  . potassium chloride (K-DUR,KLOR-CON) 10 MEQ tablet Take 20 mEq by mouth daily.  Marland Kitchen PROAIR HFA 108 (90 BASE) MCG/ACT inhaler 2 puffs 3 (three) times daily as needed for wheezing (cough).   . promethazine (PHENERGAN) 12.5 MG tablet Take 12.5  mg by mouth every 6 (six) hours as needed for nausea or vomiting.  . traMADol (ULTRAM) 50 MG tablet Take 50 mg by mouth 2 (two) times daily.   . [DISCONTINUED] furosemide (LASIX) 20 MG tablet Take 20 mg by mouth every other day. Alternate  With 40 mg tablet   No facility-administered encounter medications on file as of 07/01/2017.      Review of Systems  Immunization History  Administered Date(s) Administered  . Influenza-Unspecified 10/10/2013   Pertinent  Health Maintenance Due  Topic Date Due  . INFLUENZA VACCINE  11/09/2017 (Originally 07/10/2017)  . DEXA SCAN  11/09/2017 (Originally 01/15/1986)  . PNA vac Low Risk Adult (1 of 2 - PCV13) 11/09/2017 (Originally [birth year]/15/1986)   No flowsheet data found. Functional Status Survey:    There were no vitals filed for this visit. There is no height or weight on file to calculate BMI. Physical Exam  Labs reviewed:  Recent Labs  06/19/17 1230 06/20/17 0730 06/24/17 0715  NA 137 137 138  K 4.3 3.9 4.1  CL 103 103 104  CO2 24 27 28   GLUCOSE 98 97 90  BUN 22* 23* 24*  CREATININE 0.81 0.81 0.73  CALCIUM 8.6* 8.8* 8.3*    Recent Labs  12/24/16 0800 05/09/17 0720 06/04/17 0717  AST 28 23 19   ALT 16 16 13*  ALKPHOS 101 122 105  BILITOT 0.4 0.8 0.8  PROT 6.7 6.7 6.0*  ALBUMIN 3.7 3.6 3.3*    Recent Labs  05/13/17 0715  06/19/17 1230 06/24/17 0715 07/01/17 0600  WBC 8.3  < > 9.6 7.3 7.2  NEUTROABS 4.4  --  7.7 4.1  --   HGB 13.1  < > 10.2* 9.5* 8.8*  HCT 40.8  < > 31.1* 29.3* 27.8*  MCV 89.1  < > 88.4 88.3 87.7  PLT 232  < > 248 256 265  < > = values in this interval not displayed. Lab Results  Component Value Date   TSH 1.100 06/04/2017   Lab Results  Component Value Date   HGBA1C 5.5 12/06/2016   Lab Results  Component Value Date   CHOL 98 06/04/2017   HDL 50 06/04/2017   LDLCALC 40 06/04/2017   TRIG 40 06/04/2017   CHOLHDL 2.0 06/04/2017    Significant Diagnostic Results in last 30 days:  Dg Chest 2  View  Result Date: 06/24/2017 CLINICAL DATA:  Peripheral edema. EXAM: CHEST  2 VIEW COMPARISON:  06/19/2017. FINDINGS: Stable enlarged cardiac silhouette. Clear lungs with normal vascularity. Marked bilateral shoulder degenerative changes. Superior migration of the humeral heads is again noted. Diffuse osteopenia. IMPRESSION: No acute abnormality. Stable cardiomegaly and marked bilateral shoulder degenerative changes with evidence of chronic bilateral rotator cuff tears. Electronically Signed   By:  Claudie Revering M.D.   On: 06/24/2017 19:29   Dg Chest 2 View  Result Date: 06/19/2017 CLINICAL DATA:  Shortness of Breath EXAM: CHEST  2 VIEW COMPARISON:  December 06, 2016 FINDINGS: There is scarring in the left base. There is no edema or consolidation. Heart is enlarged with pulmonary vascularity within normal limits. No adenopathy. There is aortic atherosclerosis. There is degenerative change in the thoracic spine. There is advanced arthropathy in both shoulders with superior migration of each humeral head. IMPRESSION: Scarring left base. No edema or consolidation. Stable cardiomegaly. There is aortic atherosclerosis. There is advanced arthropathy in each shoulder with superior migration of each humeral head, indicative of chronic rotator cuff tears. Aortic Atherosclerosis (ICD10-I70.0). Electronically Signed   By: Lowella Grip III M.D.   On: 06/19/2017 15:13   Dg Knee 1-2 Views Right  Result Date: 06/24/2017 CLINICAL DATA:  Right knee swelling. EXAM: RIGHT KNEE - 1-2 VIEW COMPARISON:  06/19/2017. FINDINGS: A right total knee prosthesis is again demonstrated. There is lucency and bone fragmentation surrounding all 3 prosthetic components with progressive posterior subluxation of the tibia relative to the femur. Multiple loose bodies are demonstrated, increased in number. There is a small effusion. IMPRESSION: 1. Total knee prosthesis with significant loosening of all 3 components again demonstrated. Again,  associated infection cannot be excluded. 2. Progressive posterior subluxation of the tibia relative to the femur. Electronically Signed   By: Claudie Revering M.D.   On: 06/24/2017 19:32   US Venous Img Lower Bilateral  Result Date: 06/24/2017 CLINICAL DATA:  Initial evaluation for lower extremity edema, chronic lower extremity pain for 1 year. EXAM: BILATERAL LOWER EXTREMITY VENOUS DOPPLER ULTRASOUND TECHNIQUE: Gray-scale sonography with graded compression, as well as color Doppler and duplex ultrasound were performed to evaluate the lower extremity deep venous systems from the level of the common femoral vein and including the common femoral, femoral, profunda femoral, popliteal and calf veins including the posterior tibial, peroneal and gastrocnemius veins when visible. The superficial great saphenous vein was also interrogated. Spectral Doppler was utilized to evaluate flow at rest and with distal augmentation maneuvers in the common femoral, femoral and popliteal veins. COMPARISON:  None. FINDINGS: RIGHT LOWER EXTREMITY Common Femoral Vein: No evidence of thrombus. Normal compressibility, respiratory phasicity and response to augmentation. Saphenofemoral Junction: No evidence of thrombus. Normal compressibility and flow on color Doppler imaging. Profunda Femoral Vein: No evidence of thrombus. Normal compressibility and flow on color Doppler imaging. Femoral Vein: No evidence of thrombus. Normal compressibility, respiratory phasicity and response to augmentation. Popliteal Vein: No evidence of thrombus. Normal compressibility, respiratory phasicity and response to augmentation. Calf Veins: No evidence of thrombus. Normal compressibility and flow on color Doppler imaging. Superficial Great Saphenous Vein: No evidence of thrombus. Normal compressibility and flow on color Doppler imaging. Venous Reflux:  None. Other Findings: Mild diffuse edema present within the subcutaneous soft tissues. LEFT LOWER EXTREMITY  Common Femoral Vein: No evidence of thrombus. Normal compressibility, respiratory phasicity and response to augmentation. Saphenofemoral Junction: No evidence of thrombus. Normal compressibility and flow on color Doppler imaging. Profunda Femoral Vein: No evidence of thrombus. Normal compressibility and flow on color Doppler imaging. Femoral Vein: No evidence of thrombus. Normal compressibility, respiratory phasicity and response to augmentation. Popliteal Vein: No evidence of thrombus. Normal compressibility, respiratory phasicity and response to augmentation. Calf Veins: No evidence of thrombus. Normal compressibility and flow on color Doppler imaging. Superficial Great Saphenous Vein: No evidence of thrombus. Normal compressibility and flow on color  Doppler imaging. Venous Reflux:  None. Other Findings: Mild diffuse edema present within the subcutaneous soft tissues. IMPRESSION: 1. No evidence of DVT within either lower extremity. 2. Mild diffuse edema within the subcutaneous soft tissues of both lower extremities. Electronically Signed   By: Jeannine Boga M.D.   On: 06/24/2017 16:13   Dg Knee Complete 4 Views Right  Result Date: 06/19/2017 CLINICAL DATA:  Chronic right scratch the right knee pain and swelling. History of prior knee arthroplasty. EXAM: RIGHT KNEE - COMPLETE 4+ VIEW COMPARISON:  Plain films and CT right knee 08/06/2016. FINDINGS: Extensive lucency is present about both the tibial and femoral components with fragmentation of bone worst about the femoral component. The femur is medially anteriorly subluxed off of the tibial component. The tibia is posteriorly subluxed relative to the femur. Extensive heterotopic calcification is seen on the lateral view. There is a joint effusion. IMPRESSION: Findings consistent with progressive loosening of the patient's right knee arthroplasty. Coexistent infection is possible. Negative for fracture. Electronically Signed   By: Inge Rise M.D.    On: 06/19/2017 15:38    Assessment/Plan There are no diagnoses linked to this encounter.   Family/ staff Communication:   Labs/tests ordered:     This encounter was created in error - please disregard.

## 2017-07-03 ENCOUNTER — Telehealth (INDEPENDENT_AMBULATORY_CARE_PROVIDER_SITE_OTHER): Payer: Self-pay | Admitting: Internal Medicine

## 2017-07-03 NOTE — Telephone Encounter (Signed)
I talked with Gwenlyn Saran RN Supervisor. Orders given for a CBC in 1 week. If any frank bleeding, patient will be sent to the ED.  Thank u,   Deberah Castle, NP-C

## 2017-07-04 ENCOUNTER — Non-Acute Institutional Stay (SKILLED_NURSING_FACILITY): Payer: Medicare Other | Admitting: Internal Medicine

## 2017-07-04 ENCOUNTER — Encounter: Payer: Self-pay | Admitting: Internal Medicine

## 2017-07-04 DIAGNOSIS — I482 Chronic atrial fibrillation, unspecified: Secondary | ICD-10-CM

## 2017-07-04 DIAGNOSIS — I5032 Chronic diastolic (congestive) heart failure: Secondary | ICD-10-CM

## 2017-07-04 DIAGNOSIS — I639 Cerebral infarction, unspecified: Secondary | ICD-10-CM

## 2017-07-04 DIAGNOSIS — M25561 Pain in right knee: Secondary | ICD-10-CM | POA: Diagnosis not present

## 2017-07-04 DIAGNOSIS — I10 Essential (primary) hypertension: Secondary | ICD-10-CM | POA: Diagnosis not present

## 2017-07-04 DIAGNOSIS — D649 Anemia, unspecified: Secondary | ICD-10-CM | POA: Diagnosis not present

## 2017-07-04 NOTE — Progress Notes (Signed)
Location:   [facility] Room Number: 156/W Place of Service:  SNF [phone]) Provider:  Gardiner Fanti, Rene Kocher, MD  Patient Care Team: Virgie Dad, MD as PCP - General (Internal Medicine) Rolm Baptise as Physician Assistant (Internal Medicine)  Extended Emergency Contact Information Primary Emergency Contact: Gabriella, [place], [place] [address] [name] of [place] Phone: [phone] Relation: Son Secondary Emergency Contact: [name], [place] [address] [name] of [place] Phone: [phone] Mobile Phone: [phone] Relation: Son  Code Status:  DNR Goals of care: Advanced Directive information Advanced Directives 07/04/2017  Does Patient Have a Medical Advance Directive? Yes  Type of Advance Directive Out of facility DNR (pink MOST or yellow form)  Does patient want to make changes to medical advance directive? No - Patient declined  Would patient like information on creating a medical advance directive? No - Patient declined  Pre-existing out of facility DNR order (yellow form or pink MOST form) -     Chief Complaint  Patient presents with  . Medical Management of Chronic Issues    Routine Visit  For medical management of chronic medical conditions including history of atrial fibrillation-CVA-diastolic CHF-hyperlipidemia-hypertension-chronic knee pain-sciatica-anemia-constipation-history of hemorrhoids + HPI:  Pt is a 83 y.o. female seen today for medical management of chronic diseases. As noted above.  Recently she's had 2 rather acute issues-she has complained of knee pain with a history of previous right knee replacement-not a candidate for revision because of comorbidities and advanced age.  Does complain of some increased knee pain we did a Doppler which was negative x-ray did not show any acute changes also.  Ortho Consult thought there may be sciatica contributing to this and she's been  started on Neurontin-continues on tramadol as well as oltaren gel and apparently appears to be more comfortable here   She also has a history of anemia with occult positive bleeding-this is been evaluated by GI and they are following her labs closely-hemoglobin has dropped from baseline of between 11 and 13 several weeks ago down to 8.8 on most recent lab-she is on a proton pump inhibitor-and has GI follow-up arranged.  She also has a history of hemorrhoids not to be a good candidate for banding because she is onEliquis with a history of atrial fibrillation and CVA-it is thought holding Eliquist is probably too great a risk considering the benefits of banding  In regards to atrial fibrillation she is onEliquist--as well as diltiazem for rate control this appears to be stable she also has a history of diastolic CHF is on Lasix 40 mg a day which was recently increased her weight appears to be stabilized she is also on potassium supplementation.  She also has a history of hypertension appears generally her systolics are stable in the 120s range often but I do is in the 150s today I took it manually and got 150/70-this does not appear to be consistent occasionally she will spiked a bit this does not appear to be persistent.  She also has a recent history of right retinal vein occlusion she has seen ophthalmology and there is follow-up apparently arranged-she says the blurriness in her eye actually has improved some she has prescription lenses.  Currently she is sitting in her wheelchair comfortably eating dinner appears to have a good appetite she does not really have complaints at this point   Past  Medical History:  Diagnosis Date  . Atrial fibrillation ([place])   . Breast nodule 11/15/2014  . Breast pain, left 11/15/2014  . Carotid artery disease ([place])   . Dizziness   . Dyspnea    Previous CPX suggesting possible restrictive physiology, respiratory muscle fatigue, diastolic dysfunction  . Essential  hypertension, benign   . Hyperlipidemia   . Oxygen dependent    2 liter  . Rib pain on left side 11/15/2014  . Seasonal allergies   . Shingles 05/04/2015  . Toe fracture, left    left big toe   Past Surgical History:  Procedure Laterality Date  . COLONOSCOPY  02/22/2012   Procedure: COLONOSCOPY;  Surgeon: Rogene Houston, MD;  Location: [facility];  Service: Endoscopy;  Laterality: N/A;  100  . KNEE SURGERY     bilateral    Allergies  Allergen Reactions  . Sulfur Swelling    Rash also per patient    Outpatient Encounter Prescriptions as of 07/04/2017  Medication Sig  . acetaminophen (TYLENOL) 500 MG tablet Take 1,000 mg by mouth 3 (three) times daily.  Jearl Klinefelter ELLIPTA 62.5-25 MCG/INH AEPB Inhale 1 Inhaler into the lungs daily.   Marland Kitchen apixaban (ELIQUIS) 2.5 MG TABS tablet Take 1 tablet (2.5 mg total) by mouth 2 (two) times daily.  Marland Kitchen aspirin 81 MG chewable tablet Chew 81 mg by mouth daily.  Marland Kitchen atorvastatin (LIPITOR) 80 MG tablet Take 1 tablet (80 mg total) by mouth daily at 6 PM.  . cycloSPORINE (RESTASIS) 0.05 % ophthalmic emulsion Place 1 drop into both eyes every 12 (twelve) hours.   . diclofenac sodium (VOLTAREN) 1 % GEL Apply 2 g topically 2 (two) times daily.  Marland Kitchen diltiazem (CARDIZEM CD) 180 MG 24 hr capsule TAKE ONE CAPSULE BY MOUTH ONCE DAILY.  . furosemide (LASIX) 40 MG tablet Take 40 mg by mouth daily.   Marland Kitchen gabapentin (NEURONTIN) 100 MG capsule Take 1 capsule (100 mg total) by mouth 3 (three) times daily.  . hydrocortisone (ANUSOL-HC) 2.5 % rectal cream Place 1 application rectally 2 (two) times daily as needed for hemorrhoids or anal itching.  . Multiple Vitamin (MULITIVITAMIN WITH MINERALS) TABS Take 1 tablet by mouth every other day.   Marland Kitchen omeprazole (PRILOSEC) 20 MG capsule Take 20 mg by mouth daily.    . OXYGEN Inhale 2 L into the lungs daily. Oxygen @@ 2 L/mn via qhs. Twice a day   . Polyethyl Glycol-Propyl Glycol (SYSTANE OP) Place 1 drop into both eyes 3 (three) times  daily. For dry eyes  . polyethylene glycol (MIRALAX / GLYCOLAX) packet Take 17 g by mouth daily.  . potassium chloride (K-DUR,KLOR-CON) 10 MEQ tablet Take 20 mEq by mouth daily.  Marland Kitchen PROAIR HFA 108 (90 BASE) MCG/ACT inhaler 2 puffs 3 (three) times daily as needed for wheezing (cough).   . promethazine (PHENERGAN) 12.5 MG tablet Take 12.5 mg by mouth every 6 (six) hours as needed for nausea or vomiting.  . traMADol (ULTRAM) 50 MG tablet Take 50 mg by mouth 2 (two) times daily.    No facility-administered encounter medications on file as of 07/04/2017.      Review of Systems   In general she does not complain of any fever or chills weight appears to be stabilized at around 144 pounds.  Skin is not complaining of any rashes or itching.  Head ears eyes nose mouth and throat does have hearing loss but does not complain of sore throat again visual changes of  her right eye which she says actually has improved somewhat.  Respiratory is not complaining of shortness breath or cough does have a history COPD.  Cardiac does not complaining of any chest pain edema appears to be somewhat baseline lower extremities.  GI does not complaining of any nausea vomiting diarrhea constipation or abdominal discomfort.  GU does not complain of dysuria.  Musculoskeletal has had a considerable history here especially the right leg area as noted above this appears to have improved as noted above. Does occasionally complain of some left posterior thorax discomfort with movement-   Neurologic does not complain of dizziness or headache continues with some element of expressive aphasia but this appears to have improved somewhat.  Psych she is not complaining of overt anxiety or depression appears to be in good spirits appears to be feeling better ever since her pain got under better control  Immunization History  Administered Date(s) Administered  . Influenza-Unspecified 10/10/2013   Pertinent  Health Maintenance  Due  Topic Date Due  . INFLUENZA VACCINE  11/09/2017 (Originally 07/10/2017)  . DEXA SCAN  11/09/2017 (Originally 01/15/1986)  . PNA vac Low Risk Adult (1 of 2 - PCV13) 11/09/2017 (Originally 01/15/1986)   No flowsheet data found. Functional Status Survey:    Vitals:   07/04/17 1500  BP: (!) 152/64  Pulse: 77  Resp: 18  Temp: 98.2 F (36.8 C)  TempSrc: Oral  Of note taken manually blood pressure was 150/70 appears baseline runs more in the 409-735 range systolically Weight is 329.9 pounds Physical Exam In general this is a pleasant elderly resident in no distress sitting comfortably in her wheelchair--eating dinner --appearsood appetite  Her skin is warm and dry.-  Eyes visual acuity appears grossly intact does have some history of blurriness of right eye as noted above--increase his alertness has improved is able to make of the fingers I'm holding up-she does have prescription lenses   Oropharynx clear mucous membranes moist.  Chest is clear to auscultation there is no labored breathing-- somewhat shallow air entry   Heart is irregular irregular rate and rhythm she has on-2 plus lower extremity which appears relatively baseline she has had her legs in the dependent appears for a while   Abdomen is soft nontender with positive bowel sounds.     MS-moves all extremities 4  Continues with some chronic right knee edema-there is less pain with movement of her lower extremities however -- appears to be significantly more comfortable-she moves her upper extremities at baseline-  Left posterior thorax area does not have any deformity or tenderness to palpation there is no erythema or elevation     Neurologic is grossly intact her speech is clear could not really appreciate lateralizing findings. Her expressive aphasia persists but appears to have improved-  Psych she appears grossly alert and oriented pleasant and appropriate  Labs reviewed:  Recent Labs   06/19/17 1230 06/20/17 0730 06/24/17 0715  NA 137 137 138  K 4.3 3.9 4.1  CL 103 103 104  CO2 24 27 28   GLUCOSE 98 97 90  BUN 22* 23* 24*  CREATININE 0.81 0.81 0.73  CALCIUM 8.6* 8.8* 8.3*    Recent Labs  12/24/16 0800 05/09/17 0720 06/04/17 0717  AST 28 23 19   ALT 16 16 13*  ALKPHOS 101 122 105  BILITOT 0.4 0.8 0.8  PROT 6.7 6.7 6.0*  ALBUMIN 3.7 3.6 3.3*    Recent Labs  05/13/17 0715  06/19/17 1230 06/24/17 0715 07/01/17 0600  WBC 8.3  < > 9.6 7.3 7.2  NEUTROABS 4.4  --  7.7 4.1  --   HGB 13.1  < > 10.2* 9.5* 8.8*  HCT 40.8  < > 31.1* 29.3* 27.8*  MCV 89.1  < > 88.4 88.3 87.7  PLT 232  < > 248 256 265  < > = values in this interval not displayed. Lab Results  Component Value Date   TSH 1.100 06/04/2017   Lab Results  Component Value Date   HGBA1C 5.5 12/06/2016   Lab Results  Component Value Date   CHOL 98 06/04/2017   HDL 50 06/04/2017   LDLCALC 40 06/04/2017   TRIG 40 06/04/2017   CHOLHDL 2.0 06/04/2017    Significant Diagnostic Results in last 30 days:  Dg Chest 2 View  Result Date: 06/24/2017 CLINICAL DATA:  Peripheral edema. EXAM: CHEST  2 VIEW COMPARISON:  06/19/2017. FINDINGS: Stable enlarged cardiac silhouette. Clear lungs with normal vascularity. Marked bilateral shoulder degenerative changes. Superior migration of the humeral heads is again noted. Diffuse osteopenia. IMPRESSION: No acute abnormality. Stable cardiomegaly and marked bilateral shoulder degenerative changes with evidence of chronic bilateral rotator cuff tears. Electronically Signed   By: Claudie Revering M.D.   On: 06/24/2017 19:29   Dg Chest 2 View  Result Date: 06/19/2017 CLINICAL DATA:  Shortness of Breath EXAM: CHEST  2 VIEW COMPARISON:  December 06, 2016 FINDINGS: There is scarring in the left base. There is no edema or consolidation. Heart is enlarged with pulmonary vascularity within normal limits. No adenopathy. There is aortic atherosclerosis. There is degenerative change  in the thoracic spine. There is advanced arthropathy in both shoulders with superior migration of each humeral head. IMPRESSION: Scarring left base. No edema or consolidation. Stable cardiomegaly. There is aortic atherosclerosis. There is advanced arthropathy in each shoulder with superior migration of each humeral head, indicative of chronic rotator cuff tears. Aortic Atherosclerosis (ICD10-I70.0). Electronically Signed   By: Lowella Grip III M.D.   On: 06/19/2017 15:13   Dg Knee 1-2 Views Right  Result Date: 06/24/2017 CLINICAL DATA:  Right knee swelling. EXAM: RIGHT KNEE - 1-2 VIEW COMPARISON:  06/19/2017. FINDINGS: A right total knee prosthesis is again demonstrated. There is lucency and bone fragmentation surrounding all 3 prosthetic components with progressive posterior subluxation of the tibia relative to the femur. Multiple loose bodies are demonstrated, increased in number. There is a small effusion. IMPRESSION: 1. Total knee prosthesis with significant loosening of all 3 components again demonstrated. Again, associated infection cannot be excluded. 2. Progressive posterior subluxation of the tibia relative to the femur. Electronically Signed   By: Claudie Revering M.D.   On: 06/24/2017 19:32   US Venous Img Lower Bilateral  Result Date: 06/24/2017 CLINICAL DATA:  Initial evaluation for lower extremity edema, chronic lower extremity pain for 1 year. EXAM: BILATERAL LOWER EXTREMITY VENOUS DOPPLER ULTRASOUND TECHNIQUE: Gray-scale sonography with graded compression, as well as color Doppler and duplex ultrasound were performed to evaluate the lower extremity deep venous systems from the level of the common femoral vein and including the common femoral, femoral, profunda femoral, popliteal and calf veins including the posterior tibial, peroneal and gastrocnemius veins when visible. The superficial great saphenous vein was also interrogated. Spectral Doppler was utilized to evaluate flow at rest and  with distal augmentation maneuvers in the common femoral, femoral and popliteal veins. COMPARISON:  None. FINDINGS: RIGHT LOWER EXTREMITY Common Femoral Vein: No evidence of thrombus. Normal compressibility, respiratory phasicity and response to augmentation.  Saphenofemoral Junction: No evidence of thrombus. Normal compressibility and flow on color Doppler imaging. Profunda Femoral Vein: No evidence of thrombus. Normal compressibility and flow on color Doppler imaging. Femoral Vein: No evidence of thrombus. Normal compressibility, respiratory phasicity and response to augmentation. Popliteal Vein: No evidence of thrombus. Normal compressibility, respiratory phasicity and response to augmentation. Calf Veins: No evidence of thrombus. Normal compressibility and flow on color Doppler imaging. Superficial Great Saphenous Vein: No evidence of thrombus. Normal compressibility and flow on color Doppler imaging. Venous Reflux:  None. Other Findings: Mild diffuse edema present within the subcutaneous soft tissues. LEFT LOWER EXTREMITY Common Femoral Vein: No evidence of thrombus. Normal compressibility, respiratory phasicity and response to augmentation. Saphenofemoral Junction: No evidence of thrombus. Normal compressibility and flow on color Doppler imaging. Profunda Femoral Vein: No evidence of thrombus. Normal compressibility and flow on color Doppler imaging. Femoral Vein: No evidence of thrombus. Normal compressibility, respiratory phasicity and response to augmentation. Popliteal Vein: No evidence of thrombus. Normal compressibility, respiratory phasicity and response to augmentation. Calf Veins: No evidence of thrombus. Normal compressibility and flow on color Doppler imaging. Superficial Great Saphenous Vein: No evidence of thrombus. Normal compressibility and flow on color Doppler imaging. Venous Reflux:  None. Other Findings: Mild diffuse edema present within the subcutaneous soft tissues. IMPRESSION: 1. No  evidence of DVT within either lower extremity. 2. Mild diffuse edema within the subcutaneous soft tissues of both lower extremities. Electronically Signed   By: Jeannine Boga M.D.   On: 06/24/2017 16:13   Dg Knee Complete 4 Views Right  Result Date: 06/19/2017 CLINICAL DATA:  Chronic right scratch the right knee pain and swelling. History of prior knee arthroplasty. EXAM: RIGHT KNEE - COMPLETE 4+ VIEW COMPARISON:  Plain films and CT right knee 08/06/2016. FINDINGS: Extensive lucency is present about both the tibial and femoral components with fragmentation of bone worst about the femoral component. The femur is medially anteriorly subluxed off of the tibial component. The tibia is posteriorly subluxed relative to the femur. Extensive heterotopic calcification is seen on the lateral view. There is a joint effusion. IMPRESSION: Findings consistent with progressive loosening of the patient's right knee arthroplasty. Coexistent infection is possible. Negative for fracture. Electronically Signed   By: Inge Rise M.D.   On: 06/19/2017 15:38    Assessment/Plan #1-history of-fibrillation this appears rate controlled on diltiazem she is on Eliquis for anticoagulation will monitor.  #2 history CVA continues again on  Eliiquis--she continues with some expressive aphasia which has improved over time.--She is also on a statin LDL was 40 on lab done in June 2018  #5-QDIYMEB of diastolic CHF-Lasix was recently increased to 40 mg a day because of concerns of increased weight the weight appears to have stabilized-most recently 143.9 pounds-she is on potassium supplementation will update a metabolic panel to ensure stability of renal function.  #4 history of anemia again this has been concerning issue recently with hemoglobin dropping down to 8.8-gastroenterology is aware and following this is well they have ordered an updated CBC for early next week we will try to obtain one tomorrow as well.  Clinically  she appears stable is not really complaining of any increased weakness syncope-she has had occult positive stool-this was discussed with Dr. Lyndel Safe and also will add iron-and we'll discontinue her aspirin to try to minimize bleeding risk-again this was discussed with Dr. Lyndel Safe.  \GI has a standing order to send her t to the ER for any frank bleeding  #5-history of chronic  knee pain as noted above this appears to be under better control with associated concern for sciatica-she has seen orthopedics is now on Neurontin as well as tramadol and Voltaren gel and appears to be doing well with this. At times she will complain of some left posterior thorax pain this apparently is intermittent and not acute per patient will monitor--at this point would suspect possibly a minor muscle strain  #6-history of hypertension she is on diltiazem as noted above there is some variable systolics will order blood pressure checks twice a day with a log for provider review next week.  #7-history of hyperlipidemia LDL was 40 on lab done in June-she is on atorvastatin.  #8 history of hemorrhoids this has been somewhat of a persistent issue again thought to be a good candidate for banding as discussed above-GI does not really want to take her off her Eliquis for an extended period of time.  She does have Anusol as needed she is not really complaining of any pain today.  #9 history of right retinal vein occlusion she is followed by ophthalmology again she thinks the blurriness in her right eye actually has improved.  #10-history of ICA stenosis sent has not opted for aggressive follow-up here for treatment-she is on  Eliquis--again aspirin is being discontinued secondary to concerns for bleeding risk with dropping hemoglobin.  #11 history constipation continues on MiraLAX this appears to be stable.  Again will update a CBC and metabolic panel tomorrow for updated values we'll discontinue aspirin-start iron 325 mg a day-and  also monitor blood pressures twice a day.  [id number] note greater than 45 minutes spent assessing patient-reviewing her chart-reviewing her labs-discussing her concerns at bedside-in coordinating and formulating a plan of care for numerous diagnoses-of note greater than 50% of time spent coordinating plan of care

## 2017-07-05 ENCOUNTER — Ambulatory Visit: Payer: Self-pay | Admitting: Orthopedic Surgery

## 2017-07-05 ENCOUNTER — Encounter (HOSPITAL_COMMUNITY)
Admission: RE | Admit: 2017-07-05 | Discharge: 2017-07-05 | Disposition: A | Discharge status: Home / Self Care | Payer: Medicare Other | Source: Skilled Nursing Facility | Attending: Internal Medicine | Admitting: Internal Medicine

## 2017-07-05 DIAGNOSIS — I63312 Cerebral infarction due to thrombosis of left middle cerebral artery: Secondary | ICD-10-CM | POA: Insufficient documentation

## 2017-07-05 DIAGNOSIS — K625 Hemorrhage of anus and rectum: Secondary | ICD-10-CM | POA: Insufficient documentation

## 2017-07-05 DIAGNOSIS — Z5189 Encounter for other specified aftercare: Secondary | ICD-10-CM | POA: Diagnosis not present

## 2017-07-05 DIAGNOSIS — I5032 Chronic diastolic (congestive) heart failure: Secondary | ICD-10-CM | POA: Diagnosis not present

## 2017-07-05 DIAGNOSIS — M1990 Unspecified osteoarthritis, unspecified site: Secondary | ICD-10-CM | POA: Diagnosis not present

## 2017-07-05 LAB — BASIC METABOLIC PANEL
Anion gap: 7 (ref 5–15)
BUN: 28 mg/dL — AB (ref 6–20)
CHLORIDE: 105 mmol/L (ref 101–111)
CO2: 28 mmol/L (ref 22–32)
Calcium: 8.5 mg/dL — ABNORMAL LOW (ref 8.9–10.3)
Creatinine, Ser: 1.01 mg/dL — ABNORMAL HIGH (ref 0.44–1.00)
GFR calc Af Amer: 53 mL/min — ABNORMAL LOW (ref >60)
GFR calc non Af Amer: 45 mL/min — ABNORMAL LOW (ref >60)
GLUCOSE: 93 mg/dL (ref 65–99)
POTASSIUM: 4.2 mmol/L (ref 3.5–5.1)
Sodium: 140 mmol/L (ref 135–145)

## 2017-07-05 LAB — CBC WITH DIFFERENTIAL/PLATELET
Basophils Absolute: 0 10*3/uL (ref 0.0–0.1)
Basophils Relative: 0 %
Eosinophils Absolute: 0.2 10*3/uL (ref 0.0–0.7)
Eosinophils Relative: 2 %
HCT: 27.4 % — ABNORMAL LOW (ref 36.0–46.0)
HEMOGLOBIN: 8.8 g/dL — AB (ref 12.0–15.0)
LYMPHS PCT: 21 %
Lymphs Abs: 1.8 10*3/uL (ref 0.7–4.0)
MCH: 28 pg (ref 26.0–34.0)
MCHC: 32.1 g/dL (ref 30.0–36.0)
MCV: 87.3 fL (ref 78.0–100.0)
Monocytes Absolute: 1 10*3/uL (ref 0.1–1.0)
Monocytes Relative: 11 %
NEUTROS PCT: 66 %
Neutro Abs: 5.6 10*3/uL (ref 1.7–7.7)
Platelets: 234 10*3/uL (ref 150–400)
RBC: 3.14 MIL/uL — AB (ref 3.87–5.11)
RDW: 13.5 % (ref 11.5–15.5)
WBC: 8.5 10*3/uL (ref 4.0–10.5)

## 2017-07-08 ENCOUNTER — Emergency Department (HOSPITAL_COMMUNITY): Payer: Medicare Other

## 2017-07-08 ENCOUNTER — Encounter: Payer: Self-pay | Admitting: Internal Medicine

## 2017-07-08 ENCOUNTER — Other Ambulatory Visit: Payer: Self-pay

## 2017-07-08 ENCOUNTER — Encounter (HOSPITAL_COMMUNITY)
Admission: RE | Admit: 2017-07-08 | Discharge: 2017-07-08 | Disposition: A | Discharge status: Home / Self Care | Payer: Medicare Other | Source: Skilled Nursing Facility | Attending: *Deleted | Admitting: *Deleted

## 2017-07-08 ENCOUNTER — Observation Stay (HOSPITAL_COMMUNITY)
Admission: EM | Admit: 2017-07-08 | Discharge: 2017-07-09 | Disposition: A | Discharge status: Home / Self Care | Payer: Medicare Other | Attending: Internal Medicine | Admitting: Internal Medicine

## 2017-07-08 ENCOUNTER — Encounter (HOSPITAL_COMMUNITY): Payer: Self-pay | Admitting: Emergency Medicine

## 2017-07-08 ENCOUNTER — Non-Acute Institutional Stay (SKILLED_NURSING_FACILITY): Payer: Medicare Other | Admitting: Internal Medicine

## 2017-07-08 DIAGNOSIS — I110 Hypertensive heart disease with heart failure: Principal | ICD-10-CM | POA: Insufficient documentation

## 2017-07-08 DIAGNOSIS — Z9981 Dependence on supplemental oxygen: Secondary | ICD-10-CM | POA: Insufficient documentation

## 2017-07-08 DIAGNOSIS — I5032 Chronic diastolic (congestive) heart failure: Secondary | ICD-10-CM | POA: Diagnosis not present

## 2017-07-08 DIAGNOSIS — I5033 Acute on chronic diastolic (congestive) heart failure: Secondary | ICD-10-CM | POA: Diagnosis not present

## 2017-07-08 DIAGNOSIS — I6523 Occlusion and stenosis of bilateral carotid arteries: Secondary | ICD-10-CM | POA: Diagnosis not present

## 2017-07-08 DIAGNOSIS — R109 Unspecified abdominal pain: Secondary | ICD-10-CM | POA: Diagnosis not present

## 2017-07-08 DIAGNOSIS — M6281 Muscle weakness (generalized): Secondary | ICD-10-CM | POA: Diagnosis not present

## 2017-07-08 DIAGNOSIS — J90 Pleural effusion, not elsewhere classified: Secondary | ICD-10-CM

## 2017-07-08 DIAGNOSIS — R188 Other ascites: Secondary | ICD-10-CM | POA: Diagnosis not present

## 2017-07-08 DIAGNOSIS — E785 Hyperlipidemia, unspecified: Secondary | ICD-10-CM | POA: Diagnosis not present

## 2017-07-08 DIAGNOSIS — R600 Localized edema: Secondary | ICD-10-CM | POA: Diagnosis present

## 2017-07-08 DIAGNOSIS — I517 Cardiomegaly: Secondary | ICD-10-CM | POA: Diagnosis not present

## 2017-07-08 DIAGNOSIS — I509 Heart failure, unspecified: Secondary | ICD-10-CM | POA: Diagnosis not present

## 2017-07-08 DIAGNOSIS — D72829 Elevated white blood cell count, unspecified: Secondary | ICD-10-CM

## 2017-07-08 DIAGNOSIS — R635 Abnormal weight gain: Secondary | ICD-10-CM | POA: Diagnosis not present

## 2017-07-08 DIAGNOSIS — I4891 Unspecified atrial fibrillation: Secondary | ICD-10-CM | POA: Diagnosis not present

## 2017-07-08 DIAGNOSIS — M25561 Pain in right knee: Secondary | ICD-10-CM | POA: Diagnosis not present

## 2017-07-08 DIAGNOSIS — J449 Chronic obstructive pulmonary disease, unspecified: Secondary | ICD-10-CM | POA: Diagnosis present

## 2017-07-08 DIAGNOSIS — H348192 Central retinal vein occlusion, unspecified eye, stable: Secondary | ICD-10-CM | POA: Diagnosis not present

## 2017-07-08 LAB — CBC
HCT: 31.9 % — ABNORMAL LOW (ref 36.0–46.0)
Hemoglobin: 10.2 g/dL — ABNORMAL LOW (ref 12.0–15.0)
MCH: 28.3 pg (ref 26.0–34.0)
MCHC: 32 g/dL (ref 30.0–36.0)
MCV: 88.4 fL (ref 78.0–100.0)
PLATELETS: 274 10*3/uL (ref 150–400)
RBC: 3.61 MIL/uL — AB (ref 3.87–5.11)
RDW: 13.8 % (ref 11.5–15.5)
WBC: 13.1 10*3/uL — ABNORMAL HIGH (ref 4.0–10.5)

## 2017-07-08 LAB — LACTIC ACID, PLASMA: Lactic Acid, Venous: 2.1 mmol/L (ref 0.5–1.9)

## 2017-07-08 LAB — COMPREHENSIVE METABOLIC PANEL
ALBUMIN: 3.6 g/dL (ref 3.5–5.0)
ALK PHOS: 134 U/L — AB (ref 38–126)
ALT: 25 U/L (ref 14–54)
ANION GAP: 11 (ref 5–15)
AST: 33 U/L (ref 15–41)
BILIRUBIN TOTAL: 0.8 mg/dL (ref 0.3–1.2)
BUN: 29 mg/dL — ABNORMAL HIGH (ref 6–20)
CALCIUM: 8.9 mg/dL (ref 8.9–10.3)
CO2: 23 mmol/L (ref 22–32)
Chloride: 104 mmol/L (ref 101–111)
Creatinine, Ser: 1.01 mg/dL — ABNORMAL HIGH (ref 0.44–1.00)
GFR, EST AFRICAN AMERICAN: 53 mL/min — AB (ref >60)
GFR, EST NON AFRICAN AMERICAN: 45 mL/min — AB (ref >60)
Glucose, Bld: 94 mg/dL (ref 65–99)
Potassium: 4.7 mmol/L (ref 3.5–5.1)
Sodium: 138 mmol/L (ref 135–145)
TOTAL PROTEIN: 6.8 g/dL (ref 6.5–8.1)

## 2017-07-08 LAB — CBC WITH DIFFERENTIAL/PLATELET
Basophils Absolute: 0 10*3/uL (ref 0.0–0.1)
Basophils Relative: 0 %
EOS ABS: 0 10*3/uL (ref 0.0–0.7)
EOS PCT: 0 %
HCT: 32.1 % — ABNORMAL LOW (ref 36.0–46.0)
Hemoglobin: 10.2 g/dL — ABNORMAL LOW (ref 12.0–15.0)
LYMPHS ABS: 1.6 10*3/uL (ref 0.7–4.0)
Lymphocytes Relative: 18 %
MCH: 27.6 pg (ref 26.0–34.0)
MCHC: 31.8 g/dL (ref 30.0–36.0)
MCV: 87 fL (ref 78.0–100.0)
Monocytes Absolute: 1.1 10*3/uL — ABNORMAL HIGH (ref 0.1–1.0)
Monocytes Relative: 12 %
Neutro Abs: 6.2 10*3/uL (ref 1.7–7.7)
Neutrophils Relative %: 70 %
PLATELETS: 273 10*3/uL (ref 150–400)
RBC: 3.69 MIL/uL — AB (ref 3.87–5.11)
RDW: 13.6 % (ref 11.5–15.5)
WBC: 8.9 10*3/uL (ref 4.0–10.5)

## 2017-07-08 LAB — URINALYSIS, ROUTINE W REFLEX MICROSCOPIC
BILIRUBIN URINE: NEGATIVE
Bacteria, UA: NONE SEEN
Glucose, UA: NEGATIVE mg/dL
Hgb urine dipstick: NEGATIVE
KETONES UR: NEGATIVE mg/dL
Nitrite: NEGATIVE
PROTEIN: NEGATIVE mg/dL
Specific Gravity, Urine: 1.01 (ref 1.005–1.030)
pH: 6 (ref 5.0–8.0)

## 2017-07-08 LAB — BRAIN NATRIURETIC PEPTIDE: B Natriuretic Peptide: 360 pg/mL — ABNORMAL HIGH (ref 0.0–100.0)

## 2017-07-08 LAB — DIFFERENTIAL
BASOS ABS: 0 10*3/uL (ref 0.0–0.1)
Basophils Relative: 0 %
Eosinophils Absolute: 0.1 10*3/uL (ref 0.0–0.7)
Eosinophils Relative: 1 %
LYMPHS PCT: 22 %
Lymphs Abs: 2.9 10*3/uL (ref 0.7–4.0)
MONO ABS: 1.6 10*3/uL — AB (ref 0.1–1.0)
Monocytes Relative: 12 %
NEUTROS ABS: 8.8 10*3/uL — AB (ref 1.7–7.7)
NEUTROS PCT: 65 %

## 2017-07-08 LAB — TROPONIN I

## 2017-07-08 LAB — LIPASE, BLOOD: LIPASE: 26 U/L (ref 11–51)

## 2017-07-08 MED ORDER — SODIUM CHLORIDE 0.9 % IV SOLN
INTRAVENOUS | Status: DC
  Administered 2017-07-08: 21:00:00 via INTRAVENOUS

## 2017-07-08 MED ORDER — IOPAMIDOL (ISOVUE-300) INJECTION 61%
100.0000 mL | Freq: Once | INTRAVENOUS | Status: AC | PRN
  Administered 2017-07-08: 75 mL via INTRAVENOUS

## 2017-07-08 MED ORDER — FUROSEMIDE 10 MG/ML IJ SOLN
40.0000 mg | Freq: Once | INTRAMUSCULAR | Status: AC
  Administered 2017-07-08: 40 mg via INTRAVENOUS
  Filled 2017-07-08: qty 4

## 2017-07-08 NOTE — H&P (Signed)
TRH H&P    Patient Demographics:    Wendy Perkins, is a 83 y.o. female  MRN: [id number]  DOB - 1919/04/23  Admit Date - 07/08/2017  Referring MD/NP/PA: Dr. Thurnell Garbe  Outpatient Primary MD for the patient is Virgie Dad, MD  Patient coming from: Skilled nursing facility  Chief Complaint  Patient presents with  . Abdominal Pain      HPI:    Wendy Perkins  is a 83 y.o. female, With history of atrial fibrillation, on anticoagulation with eliquis ,chronic diastole CHF, COPD, hypertension who was brought to the ED with abdominal pain for past several days. Patient has lower extremity swelling the dose of Lasix was increased 40 mg daily on 06/19/2017 due to weight gain. She denies nausea vomiting or diarrhea. No chest pain or shortness of breath. She denies fever, dysuria. Patient is chronically bedbound due to the right knee pain. And shoulder pain. Patient is not candidate for surgery due to CHF as the patient's family member  In the ED, CT scan of the abdomen and pelvis showed extensive chronic diverticulosis without diverticulitis, small bilateral pleural effusions. Patient's abdominal pain has resolved at this time. She does have history of constipation last bowel movement was this morning.  Patient was given Lasix 40 mg IV for  bilateral lower extremity edema    Review of systems:      All other systems reviewed and are negative.   With Past History of the following :    Past Medical History:  Diagnosis Date  . Atrial fibrillation ([place])   . Breast nodule 11/15/2014  . Breast pain, left 11/15/2014  . Carotid artery disease ([place])   . Dizziness   . Dyspnea    Previous CPX suggesting possible restrictive physiology, respiratory muscle fatigue, diastolic dysfunction  . Essential hypertension, benign   . Hyperlipidemia   . Oxygen dependent    2 liter  . Rib pain on left side 11/15/2014  .  Seasonal allergies   . Shingles 05/04/2015  . Toe fracture, left    left big toe      Past Surgical History:  Procedure Laterality Date  . COLONOSCOPY  02/22/2012   Procedure: COLONOSCOPY;  Surgeon: Rogene Houston, MD;  Location: [facility];  Service: Endoscopy;  Laterality: N/A;  100  . KNEE SURGERY     bilateral      Social History:      Social History  Substance Use Topics  . Smoking status: Never Smoker  . Smokeless tobacco: Never Used  . Alcohol use No       Family History :     Family History  Problem Relation Age of Onset  . Stroke Mother   . Pneumonia Father   . Heart disease Father   . Healthy Son   . Glaucoma Daughter   . Emphysema Daughter   . Healthy Daughter   . Other Daughter        had knee replacement  . Healthy Son   . Heart disease Maternal Grandmother  Home Medications:   Prior to Admission medications   Medication Sig Start Date End Date Taking? Authorizing Provider  acetaminophen (TYLENOL) 500 MG tablet Take 1,000 mg by mouth 3 (three) times daily.    [provider]  Celedonio Miyamoto 62.5-25 MCG/INH AEPB Inhale 1 Inhaler into the lungs daily.  10/12/14   [provider]  apixaban (ELIQUIS) 2.5 MG TABS tablet Take 1 tablet (2.5 mg total) by mouth 2 (two) times daily. 12/08/16   Sinda Du, MD  atorvastatin (LIPITOR) 80 MG tablet Take 1 tablet (80 mg total) by mouth daily at 6 PM. 10/17/15   Sinda Du, MD  cycloSPORINE (RESTASIS) 0.05 % ophthalmic emulsion Place 1 drop into both eyes every 12 (twelve) hours.     [provider]  diclofenac sodium (VOLTAREN) 1 % GEL Apply 2 g topically 2 (two) times daily. Apply to right shoulder and to left shoulder and left knee and right knee    [provider]  diltiazem (CARDIZEM CD) 180 MG 24 hr capsule TAKE ONE CAPSULE BY MOUTH ONCE DAILY. 05/10/15   Satira Sark, MD  ferrous sulfate (KP FERROUS SULFATE) 325 (65 FE) MG tablet Take 325 mg by mouth  daily with breakfast.    [provider]  furosemide (LASIX) 40 MG tablet Take 40 mg by mouth daily.     [provider]  gabapentin (NEURONTIN) 100 MG capsule Take 1 capsule (100 mg total) by mouth 3 (three) times daily. 06/25/17   Carole Civil, MD  hydrocortisone (ANUSOL-HC) 2.5 % rectal cream Place 1 application rectally 2 (two) times daily as needed for hemorrhoids or anal itching.    [provider]  Multiple Vitamin (MULITIVITAMIN WITH MINERALS) TABS Take 1 tablet by mouth every other day.     [provider]  omeprazole (PRILOSEC) 20 MG capsule Take 20 mg by mouth daily.      [provider]  OXYGEN Inhale 2 L into the lungs daily. Oxygen @@ 2 L/mn via qhs. Twice a day     [provider]  Polyethyl Glycol-Propyl Glycol (SYSTANE OP) Place 1 drop into both eyes 3 (three) times daily. For dry eyes    [provider]  polyethylene glycol (MIRALAX / GLYCOLAX) packet Take 17 g by mouth daily.    [provider]  potassium chloride (K-DUR,KLOR-CON) 10 MEQ tablet Take 20 mEq by mouth daily.    [provider]  PROAIR HFA 108 (90 BASE) MCG/ACT inhaler 2 puffs 3 (three) times daily as needed for wheezing (cough).  05/13/15   [provider]  promethazine (PHENERGAN) 12.5 MG tablet Take 12.5 mg by mouth every 6 (six) hours as needed for nausea or vomiting.    [provider]  traMADol (ULTRAM) 50 MG tablet Take 50 mg by mouth 2 (two) times daily.     [provider]     Allergies:     Allergies  Allergen Reactions  . Sulfur Swelling    Rash also per patient     Physical Exam:   Vitals  Blood pressure (!) 122/106, pulse 67, temperature 97.7 F (36.5 C), temperature source Oral, resp. rate 20, height 5\' 4"  (1.626 m), weight 64 kg (141 lb), SpO2 95 %.  1.  General: Appears in no acute distress  2. Psychiatric:  Intact judgement and  insight, awake alert, oriented x 3.  3.  Neurologic: No focal neurological deficits, all cranial nerves intact.Strength 5/5 all 4 extremities, sensation intact all  4 extremities, plantars down going.  4. Eyes :  anicteric sclerae, moist conjunctivae with no lid lag. PERRLA.  5. ENMT:  Oropharynx clear with moist mucous membranes and good dentition  6. Neck:  supple, no cervical lymphadenopathy appriciated, No thyromegaly  7. Respiratory : Normal respiratory effort, good air movement bilaterally,clear to  auscultation bilaterally  8. Cardiovascular : RRR, no gallops, rubs or murmurs, bilateral 1+ pitting edema  9. Gastrointestinal:  Positive bowel sounds, abdomen soft, non-tender to palpation,no hepatosplenomegaly, no rigidity or guarding       10. Skin:  No cyanosis, normal texture and turgor, no rash, lesions or ulcers      Data Review:    CBC  Recent Labs Lab 07/05/17 0702 07/08/17 0500 07/08/17 1641  WBC 8.5 13.1* 8.9  HGB 8.8* 10.2* 10.2*  HCT 27.4* 31.9* 32.1*  PLT 234 274 273  MCV 87.3 88.4 87.0  MCH 28.0 28.3 27.6  MCHC 32.1 32.0 31.8  RDW 13.5 13.8 13.6  LYMPHSABS 1.8 2.9 1.6  MONOABS 1.0 1.6* 1.1*  EOSABS 0.2 0.1 0.0  BASOSABS 0.0 0.0 0.0   ------------------------------------------------------------------------------------------------------------------  Chemistries   Recent Labs Lab 07/05/17 0702 07/08/17 1641  NA 140 138  K 4.2 4.7  CL 105 104  CO2 28 23  GLUCOSE 93 94  BUN 28* 29*  CREATININE 1.01* 1.01*  CALCIUM 8.5* 8.9  AST  --  33  ALT  --  25  ALKPHOS  --  134*  BILITOT  --  0.8   ------------------------------------------------------------------------------------------------------------------  ------------------------------------------------------------------------------------------------------------------ GFR: Estimated Creatinine Clearance: 28.1 mL/min (A) (by C-G formula based on SCr of 1.01 mg/dL (H)). Liver Function Tests:  Recent Labs Lab 07/08/17 1641    AST 33  ALT 25  ALKPHOS 134*  BILITOT 0.8  PROT 6.8  ALBUMIN 3.6    Recent Labs Lab 07/08/17 1641  LIPASE 26   No results for input(s): AMMONIA in the last 168 hours. Coagulation Profile: No results for input(s): INR, PROTIME in the last 168 hours. Cardiac Enzymes:  Recent Labs Lab 07/08/17 1641  TROPONINI <0.03    --------------------------------------------------------------------------------------------------------------- Urine analysis:    Component Value Date/Time   COLORURINE YELLOW 07/08/2017 1532   APPEARANCEUR CLEAR 07/08/2017 1532   LABSPEC 1.010 07/08/2017 1532   PHURINE 6.0 07/08/2017 1532   GLUCOSEU NEGATIVE 07/08/2017 1532   HGBUR NEGATIVE 07/08/2017 1532   BILIRUBINUR NEGATIVE 07/08/2017 1532   KETONESUR NEGATIVE 07/08/2017 1532   PROTEINUR NEGATIVE 07/08/2017 1532   UROBILINOGEN 1.0 10/13/2015 1351   NITRITE NEGATIVE 07/08/2017 1532   LEUKOCYTESUR TRACE (A) 07/08/2017 1532      Imaging Results:    Dg Chest 2 View  Result Date: 07/08/2017 CLINICAL DATA:  Abdominal pain EXAM: CHEST  2 VIEW COMPARISON:  06/24/2017 FINDINGS: Stable cardiac enlargement. There is evidence of associated mild interstitial edema/CHF. Probable tiny amount of bilateral posterior pleural fluid without significant pleural effusions. No focal airspace consolidation or pneumothorax. IMPRESSION: Cardiomegaly with associated mild interstitial edema/ CHF. Electronically Signed   By: Aletta Edouard M.D.   On: 07/08/2017 19:04   Ct Abdomen Pelvis W Contrast  Result Date: 07/08/2017 CLINICAL DATA:  Left lower abdominal pain x3 days, dysuria, leukocytosis EXAM: CT ABDOMEN AND PELVIS WITH CONTRAST TECHNIQUE: Multidetector CT imaging of the abdomen and pelvis was performed using the standard protocol following bolus administration of intravenous contrast. CONTRAST:  2mL ISOVUE-300 IOPAMIDOL (ISOVUE-300) INJECTION 61% COMPARISON:  None. FINDINGS: Lower chest: Small bilateral pleural  effusions. Mild bibasilar atelectasis. Hepatobiliary: Mildly  nodular hepatic contour. Calcified hepatic granulomata. Status post cholecystectomy. No intrahepatic or extrahepatic duct dilatation. Common duct measures 10 mm and smoothly tapers at the ampulla, likely postsurgical. Pancreas: Within normal limits.  No pancreatic ductal dilatation. Spleen: Normal in size. Adrenals/Urinary Tract: Adrenal glands within normal limits. Kidneys are within normal limits.  No hydronephrosis. Bladder is within normal limits. Stomach/Bowel: Stomach is within normal limits. Moderate duodenal diverticulum (series 2/ image 36). No evidence of bowel obstruction. Appendix not discretely visualized. Extensive left colonic diverticulosis, without evidence of diverticulitis. Vascular/Lymphatic: No evidence of abdominal aortic aneurysm. Atherosclerotic calcifications of the abdominal aorta and branch vessels. No suspicious abdominopelvic lymphadenopathy. Reproductive: Status post hysterectomy. No adnexal masses. Other: Small volume abdominopelvic ascites, centered in the porta hepatis, along the right pericolic gutter, and in the right pelvis. Musculoskeletal: Mild degenerative changes of the visualized thoracolumbar spine. IMPRESSION: Extensive colonic diverticulosis, without evidence of diverticulitis. No evidence of bowel obstruction. Small volume right abdominopelvic ascites, as above. Mildly nodular hepatic contour, raising the possibility of cirrhosis. Small bilateral pleural effusions. Additional ancillary findings as above. Electronically Signed   By: Julian Hy M.D.   On: 07/08/2017 18:59    My personal review of EKG: Rhythm - Atrial fibrillation   Assessment & Plan:    Active Problems:   Atrial fibrillation ([facility])   Chronic diastolic heart failure ([facility])   COPD (chronic obstructive pulmonary disease) ([place])   1. Bilateral lower extremity edema- patient given Lasix 40 mg IV in the ED. Restart Lasix 40 mg by mouth  daily from tomorrow morning. 2. Chronic diastole CHF-patient is not requiring oxygen, O2 sats 96% on room air. Chest x-ray shows very small bilateral pleural effusions. Contrast Lasix as above 3. COPD-stable 4. Atrial fibrillation- heart rate is controlled, continue anticoagulation with eliquis. Continue Cardizem 180 mg by mouth daily 5. Chronic pain syndrome-continue Tylenol, tramadol.   DVT Prophylaxis-   Eliquis  AM Labs Ordered, also please review Full Orders  Family Communication: Admission, patients condition and plan of care including tests being ordered have been discussed with the patient and her family member at bedside who indicate understanding and agree with the plan and Code Status.  Code Status:  DO NOT RESUSCITATE  Admission status: Observation    Time spent in minutes : 60 minutes   Oakes Mccready S M.D on 07/08/2017 at 10:37 PM  Between 7am to 7pm - Pager - [phone]. After 7pm go to [email or web address] - password [facility]  [facility] - Office  [phone]

## 2017-07-08 NOTE — ED Provider Notes (Signed)
[place] DEPT Provider Note   CSN: [id number] Arrival date & time: 07/08/17  1505     History   Chief Complaint Chief Complaint  Patient presents with  . Abdominal Pain    HPI Wendy Perkins is a 83 y.o. female.   Abdominal Pain     Pt was seen at 1520. Per NH report and pt, c/o gradual onset and worsening of persistent abd "pain" for the past several days. Has been associated with generalized weakness/fatigue. Pt's lasix was increased to 40mg  PO daily on 06/19/17, due to "weight gain," and increasing pedal edema with improvement until this past weekend when she again began to have "weight gain" ("at least 8 pounds") as well as increasing pedal edema.  Pt denies dysuria, no N/V/D, no back pain, no CP/SOB, no fevers, no focal motor weakness.   Past Medical History:  Diagnosis Date  . Atrial fibrillation ([place])   . Breast nodule 11/15/2014  . Breast pain, left 11/15/2014  . Carotid artery disease ([place])   . Dizziness   . Dyspnea    Previous CPX suggesting possible restrictive physiology, respiratory muscle fatigue, diastolic dysfunction  . Essential hypertension, benign   . Hyperlipidemia   . Oxygen dependent    2 liter  . Rib pain on left side 11/15/2014  . Seasonal allergies   . Shingles 05/04/2015  . Toe fracture, left    left big toe    Patient Active Problem List   Diagnosis Date Noted  . Hemorrhoids 06/03/2017  . Retinal vein occlusion of right eye 05/14/2017  . Internal carotid artery stenosis, bilateral 03/07/2017  . Back pain 03/07/2017  . Aphasia 12/06/2016  . Knee pain, acute 08/06/2016  . Fall 08/06/2016  . CVA (cerebral vascular accident) ([place]) 10/17/2015  . COPD (chronic obstructive pulmonary disease) ([place]) 10/17/2015  . UTI (urinary tract infection) 10/17/2015  . Altered mental state 10/13/2015  . Elevated troponin 10/13/2015  . Shingles 05/04/2015  . Rib pain on left side 11/15/2014  . Breast pain, left 11/15/2014  . Breast nodule 11/15/2014    . Orthostatic hypotension 09/30/2013  . Atrial fibrillation ([place]) 11/23/2011  . Chronic diastolic heart failure ([place]) 11/23/2011  . Essential hypertension, benign 07/13/2010  . DYSPNEA 11/15/2009    Past Surgical History:  Procedure Laterality Date  . COLONOSCOPY  02/22/2012   Procedure: COLONOSCOPY;  Surgeon: Rogene Houston, MD;  Location: [facility];  Service: Endoscopy;  Laterality: N/A;  100  . KNEE SURGERY     bilateral    OB History    Gravida Para Term Preterm AB Living   4 4       4    SAB TAB Ectopic Multiple Live Births                   Home Medications    Prior to Admission medications   Medication Sig Start Date End Date Taking? Authorizing Provider  acetaminophen (TYLENOL) 500 MG tablet Take 1,000 mg by mouth 3 (three) times daily.    [provider]  Celedonio Miyamoto 62.5-25 MCG/INH AEPB Inhale 1 Inhaler into the lungs daily.  10/12/14   [provider]  apixaban (ELIQUIS) 2.5 MG TABS tablet Take 1 tablet (2.5 mg total) by mouth 2 (two) times daily. 12/08/16   Sinda Du, MD  atorvastatin (LIPITOR) 80 MG tablet Take 1 tablet (80 mg total) by mouth daily at 6 PM. 10/17/15   Sinda Du, MD  cycloSPORINE (RESTASIS) 0.05 % ophthalmic emulsion Place  1 drop into both eyes every 12 (twelve) hours.     [provider]  diclofenac sodium (VOLTAREN) 1 % GEL Apply 2 g topically 2 (two) times daily. Apply to right shoulder and to left shoulder and left knee and right knee    [provider]  diltiazem (CARDIZEM CD) 180 MG 24 hr capsule TAKE ONE CAPSULE BY MOUTH ONCE DAILY. 05/10/15   Satira Sark, MD  ferrous sulfate (KP FERROUS SULFATE) 325 (65 FE) MG tablet Take 325 mg by mouth daily with breakfast.    [provider]  furosemide (LASIX) 40 MG tablet Take 40 mg by mouth daily.     [provider]  gabapentin (NEURONTIN) 100 MG capsule Take 1 capsule (100 mg total) by mouth 3 (three) times daily. 06/25/17    Carole Civil, MD  hydrocortisone (ANUSOL-HC) 2.5 % rectal cream Place 1 application rectally 2 (two) times daily as needed for hemorrhoids or anal itching.    [provider]  Multiple Vitamin (MULITIVITAMIN WITH MINERALS) TABS Take 1 tablet by mouth every other day.     [provider]  omeprazole (PRILOSEC) 20 MG capsule Take 20 mg by mouth daily.      [provider]  OXYGEN Inhale 2 L into the lungs daily. Oxygen @@ 2 L/mn via qhs. Twice a day     [provider]  Polyethyl Glycol-Propyl Glycol (SYSTANE OP) Place 1 drop into both eyes 3 (three) times daily. For dry eyes    [provider]  polyethylene glycol (MIRALAX / GLYCOLAX) packet Take 17 g by mouth daily.    [provider]  potassium chloride (K-DUR,KLOR-CON) 10 MEQ tablet Take 20 mEq by mouth daily.    [provider]  PROAIR HFA 108 (90 BASE) MCG/ACT inhaler 2 puffs 3 (three) times daily as needed for wheezing (cough).  05/13/15   [provider]  promethazine (PHENERGAN) 12.5 MG tablet Take 12.5 mg by mouth every 6 (six) hours as needed for nausea or vomiting.    [provider]  traMADol (ULTRAM) 50 MG tablet Take 50 mg by mouth 2 (two) times daily.     [provider]    Family History Family History  Problem Relation Age of Onset  . Stroke Mother   . Pneumonia Father   . Heart disease Father   . Healthy Son   . Glaucoma Daughter   . Emphysema Daughter   . Healthy Daughter   . Other Daughter        had knee replacement  . Healthy Son   . Heart disease Maternal Grandmother     Social History Social History  Substance Use Topics  . Smoking status: Never Smoker  . Smokeless tobacco: Never Used  . Alcohol use No     Allergies   Sulfur   Review of Systems Review of Systems  Gastrointestinal: Positive for abdominal pain.   ROS: Statement: All systems negative except as marked or noted in the HPI; Constitutional:  Negative for fever and chills. +generalized weakness/fatigue, weight gain. ; ; Eyes: Negative for eye pain, redness and discharge. ; ; ENMT: Negative for ear pain, hoarseness, nasal congestion, sinus pressure and sore throat. ; ; Cardiovascular: Negative for chest pain, palpitations, diaphoresis, dyspnea and +peripheral edema. ; ; Respiratory: Negative for cough, wheezing and stridor. ; ; Gastrointestinal: +abd pain. Negative for nausea, vomiting, diarrhea, blood in stool, hematemesis, jaundice and rectal bleeding. . ; ; Genitourinary: Negative for dysuria,  flank pain and hematuria. ; ; Musculoskeletal: Negative for back pain and neck pain. Negative for swelling and trauma.; ; Skin: Negative for pruritus, rash, abrasions, blisters, bruising and skin lesion.; ; Neuro: Negative for headache, lightheadedness and neck stiffness. Negative for altered level of consciousness, altered mental status, extremity weakness, paresthesias, involuntary movement, seizure and syncope.       Physical Exam Updated Vital Signs BP (!) 171/63 (BP Location: Left Arm)   Pulse 62   Temp 97.7 F (36.5 C) (Oral)   Ht 5\' 4"  (1.626 m)   Wt 64 kg (141 lb)   SpO2 99%   BMI 24.20 kg/m    16:23 Orthostatic Vital Signs TD  Orthostatic Lying   BP- Lying: 163/65  Pulse- Lying: 60      Orthostatic Sitting  BP- Sitting: 160/71  Pulse- Sitting: 69      Orthostatic Standing at 0 minutes  BP- Standing at 0 minutes: 175/65  Pulse- Standing at 0 minutes: 87    Physical Exam 1525: Physical examination:  Nursing notes reviewed; Vital signs and O2 SAT reviewed;  Constitutional: Well developed, Well nourished, Well hydrated, In no acute distress; Head:  Normocephalic, atraumatic; Eyes: EOMI, PERRL, No scleral icterus; ENMT: Mouth and pharynx normal, Mucous membranes moist; Neck: Supple, Full range of motion, No lymphadenopathy; Cardiovascular: Irregular rate and rhythm, No gallop; Respiratory: Breath sounds coarse & equal  bilaterally, No wheezes.  Speaking full sentences with ease, Normal respiratory effort/excursion; Chest: Nontender, Movement normal; Abdomen: Soft, +diffuse tenderness to palp. Nondistended, Normal bowel sounds; Genitourinary: No CVA tenderness; Extremities: Pulses normal, No tenderness, +2 pedal edema bilat. No calf asymmetry.; Neuro: AA&Ox3, vague historian. Major CN grossly intact.  Speech clear. Moves all extremities spontaneously on stretcher..; Skin: Color normal, Warm, Dry.; Psych:  Anxious.   ED Treatments / Results  Labs (all labs ordered are listed, but only abnormal results are displayed)   EKG  EKG Interpretation  Date/Time:  Monday July 08 2017 16:06:20 EDT Ventricular Rate:  59 PR Interval:    QRS Duration: 95 QT Interval:  467 QTC Calculation: 463 R Axis:   -3 Text Interpretation:  Atrial fibrillation Borderline low voltage, extremity leads When compared with ECG of 10/13/2015 No significant change was found Confirmed by [facility]  MD, Nunzio Cory [phone]) on 07/08/2017 4:19:16 PM       Radiology   Procedures Procedures (including critical care time)  Medications Ordered in ED Medications - No data to display   Initial Impression / Assessment and Plan / ED Course  I have reviewed the triage vital signs and the nursing notes.  Pertinent labs & imaging results that were available during my care of the patient were reviewed by me and considered in my medical decision making (see chart for details).  MDM Reviewed: previous chart, nursing note and vitals Reviewed previous: labs and ECG Interpretation: labs, ECG, x-ray and CT scan   Results for orders placed or performed during the hospital encounter of 07/08/17  Comprehensive metabolic panel  Result Value Ref Range   Sodium 138 135 - 145 mmol/L   Potassium 4.7 3.5 - 5.1 mmol/L   Chloride 104 101 - 111 mmol/L   CO2 23 22 - 32 mmol/L   Glucose, Bld 94 65 - 99 mg/dL   BUN 29 (H) 6 - 20 mg/dL   Creatinine, Ser 1.01  (H) 0.44 - 1.00 mg/dL   Calcium 8.9 8.9 - 10.3 mg/dL   Total Protein 6.8 6.5 - 8.1 g/dL   Albumin  3.6 3.5 - 5.0 g/dL   AST 33 15 - 41 U/L   ALT 25 14 - 54 U/L   Alkaline Phosphatase 134 (H) 38 - 126 U/L   Total Bilirubin 0.8 0.3 - 1.2 mg/dL   GFR calc non Af Amer 45 (L) >60 mL/min   GFR calc Af Amer 53 (L) >60 mL/min   Anion gap 11 5 - 15  Lipase, blood  Result Value Ref Range   Lipase 26 11 - 51 U/L  Lactic acid, plasma  Result Value Ref Range   Lactic Acid, Venous 2.1 (HH) 0.5 - 1.9 mmol/L  CBC with Differential  Result Value Ref Range   WBC 8.9 4.0 - 10.5 K/uL   RBC 3.69 (L) 3.87 - 5.11 MIL/uL   Hemoglobin 10.2 (L) 12.0 - 15.0 g/dL   HCT 32.1 (L) 36.0 - 46.0 %   MCV 87.0 78.0 - 100.0 fL   MCH 27.6 26.0 - 34.0 pg   MCHC 31.8 30.0 - 36.0 g/dL   RDW 13.6 11.5 - 15.5 %   Platelets 273 150 - 400 K/uL   Neutrophils Relative % 70 %   Neutro Abs 6.2 1.7 - 7.7 K/uL   Lymphocytes Relative 18 %   Lymphs Abs 1.6 0.7 - 4.0 K/uL   Monocytes Relative 12 %   Monocytes Absolute 1.1 (H) 0.1 - 1.0 K/uL   Eosinophils Relative 0 %   Eosinophils Absolute 0.0 0.0 - 0.7 K/uL   Basophils Relative 0 %   Basophils Absolute 0.0 0.0 - 0.1 K/uL  Urinalysis, Routine w reflex microscopic  Result Value Ref Range   Color, Urine YELLOW YELLOW   APPearance CLEAR CLEAR   Specific Gravity, Urine 1.010 1.005 - 1.030   pH 6.0 5.0 - 8.0   Glucose, UA NEGATIVE NEGATIVE mg/dL   Hgb urine dipstick NEGATIVE NEGATIVE   Bilirubin Urine NEGATIVE NEGATIVE   Ketones, ur NEGATIVE NEGATIVE mg/dL   Protein, ur NEGATIVE NEGATIVE mg/dL   Nitrite NEGATIVE NEGATIVE   Leukocytes, UA TRACE (A) NEGATIVE   RBC / HPF 0-5 0 - 5 RBC/hpf   WBC, UA 0-5 0 - 5 WBC/hpf   Bacteria, UA NONE SEEN NONE SEEN   Squamous Epithelial / LPF 0-5 (A) NONE SEEN  Brain natriuretic peptide  Result Value Ref Range   B Natriuretic Peptide 360.0 (H) 0.0 - 100.0 pg/mL  Troponin I  Result Value Ref Range   Troponin I <0.03 <0.03 ng/mL   Dg  Chest 2 View Result Date: 07/08/2017 CLINICAL DATA:  Abdominal pain EXAM: CHEST  2 VIEW COMPARISON:  06/24/2017 FINDINGS: Stable cardiac enlargement. There is evidence of associated mild interstitial edema/CHF. Probable tiny amount of bilateral posterior pleural fluid without significant pleural effusions. No focal airspace consolidation or pneumothorax. IMPRESSION: Cardiomegaly with associated mild interstitial edema/ CHF. Electronically Signed   By: Aletta Edouard M.D.   On: 07/08/2017 19:04   Ct Abdomen Pelvis W Contrast Result Date: 07/08/2017 CLINICAL DATA:  Left lower abdominal pain x3 days, dysuria, leukocytosis EXAM: CT ABDOMEN AND PELVIS WITH CONTRAST TECHNIQUE: Multidetector CT imaging of the abdomen and pelvis was performed using the standard protocol following bolus administration of intravenous contrast. CONTRAST:  40mL ISOVUE-300 IOPAMIDOL (ISOVUE-300) INJECTION 61% COMPARISON:  None. FINDINGS: Lower chest: Small bilateral pleural effusions. Mild bibasilar atelectasis. Hepatobiliary: Mildly nodular hepatic contour. Calcified hepatic granulomata. Status post cholecystectomy. No intrahepatic or extrahepatic duct dilatation. Common duct measures 10 mm and smoothly tapers at the ampulla, likely postsurgical. Pancreas: Within normal  limits.  No pancreatic ductal dilatation. Spleen: Normal in size. Adrenals/Urinary Tract: Adrenal glands within normal limits. Kidneys are within normal limits.  No hydronephrosis. Bladder is within normal limits. Stomach/Bowel: Stomach is within normal limits. Moderate duodenal diverticulum (series 2/ image 36). No evidence of bowel obstruction. Appendix not discretely visualized. Extensive left colonic diverticulosis, without evidence of diverticulitis. Vascular/Lymphatic: No evidence of abdominal aortic aneurysm. Atherosclerotic calcifications of the abdominal aorta and branch vessels. No suspicious abdominopelvic lymphadenopathy. Reproductive: Status post hysterectomy.  No adnexal masses. Other: Small volume abdominopelvic ascites, centered in the porta hepatis, along the right pericolic gutter, and in the right pelvis. Musculoskeletal: Mild degenerative changes of the visualized thoracolumbar spine. IMPRESSION: Extensive colonic diverticulosis, without evidence of diverticulitis. No evidence of bowel obstruction. Small volume right abdominopelvic ascites, as above. Mildly nodular hepatic contour, raising the possibility of cirrhosis. Small bilateral pleural effusions. Additional ancillary findings as above. Electronically Signed   By: Julian Hy M.D.   On: 07/08/2017 18:59    Results for Wendy Perkins, Wendy Perkins (MRN [id number]) as of 07/08/2017 19:31  Ref. Range 06/19/2017 12:30 06/20/2017 07:30 06/24/2017 07:15 07/05/2017 07:02 07/08/2017 16:41  BUN Latest Ref Range: 6 - 20 mg/dL 22 (H) 23 (H) 24 (H) 28 (H) 29 (H)  Creatinine Latest Ref Range: 0.44 - 1.00 mg/dL 0.81 0.81 0.73 1.01 (H) 1.01 (H)   Results for Wendy Perkins, Wendy Perkins (MRN [id number]) as of 07/08/2017 19:31  Ref. Range 12/06/2016 06:15 06/19/2017 12:30 07/08/2017 16:41  B Natriuretic Peptide Latest Ref Range: 0.0 - 100.0 pg/mL 215.0 (H) 311.0 (H) 360.0 (H)    2030:  BNP elevated with CHF on pleural effusions on XR/CT. Will dose IV lasix. Pt will need admission. T/C to [facility] Dr. Radford Pax, case discussed, including:  HPI, pertinent PM/SHx, VS/PE, dx testing, ED course and treatment:  Agrees pt will need admission to hospital, OK to stay at [facility].   2050:  Dx and testing d/w pt and family.  Questions answered.  Verb understanding, agreeable to admit.  T/C to [facility] Dr. Darrick Meigs, case discussed, including:  HPI, pertinent PM/SHx, VS/PE, dx testing, ED course and treatment:  Agreeable to admit.     Final Clinical Impressions(s) / ED Diagnoses   Final diagnoses:  None    New Prescriptions New Prescriptions   No medications on file      Francine Graven, DO 07/11/17 [id number]

## 2017-07-08 NOTE — Progress Notes (Signed)
Location:   [facility] Room Number: 156/W Place of Service:  SNF [phone]) Provider:  Freddi Starr, MD  Patient Care Team: Virgie Dad, MD as PCP - General (Internal Medicine) Rolm Baptise as Physician Assistant (Internal Medicine)  Extended Emergency Contact Information Primary Emergency Contact: Adin Hector, [place] [address] [name] of [place] Phone: [phone] Relation: Son Secondary Emergency Contact: [name], [place] [address] [name] of [place] Phone: [phone] Mobile Phone: [phone] Relation: Son  Code Status:  DNR Goals of care: Advanced Directive information Advanced Directives 07/08/2017  Does Patient Have a Medical Advance Directive? Yes  Type of Advance Directive Out of facility DNR (pink MOST or yellow form)  Does patient want to make changes to medical advance directive? No - Patient declined  Would patient like information on creating a medical advance directive? No - Patient declined  Pre-existing out of facility DNR order (yellow form or pink MOST form) -     Chief Complaint  Patient presents with  . Acute Visit    Abdominal pain and elevated WBC    HPI:  Pt is a 83 y.o. female seen today for an acute visit for Complaint right-sided abdominal pain also leukocytosis.  Patient is a long-term resident of facility with a history of atrial fibrillation as well as CVA and diastolic CHF as well as hypertension chronic knee pain with sciatica anemia and constipation and history of hemorrhoids.  Most recent acute issue is been some right leg pain thought to be sciatica she appears to be doing better with her current pain medications.  Also has a history of anemia with hemoglobin dropping this is followed by GI hemoglobin had been in the eights but actually shows improvement at 10.2 today.  However the lab did show an elevated white count of 13,200 there was  no differential.  Apparently she complained of some increased dizziness and weakness over the weekend-when I entered the room she was complaining of fairly significant right upper quadrant discomfort.  She states she has had her gallbladder and appendix removed   She also has a history of diastolic CHF and appears her weight stabilized after Lasix was recently increased however over the past few days it appears she has gained about 8 pounds of skills are accurate.  She does not complain of increased chest pain or shortness of breath but describes the abdominal pain as worsening.          Past Medical History:  Diagnosis Date  . Atrial fibrillation ([place])   . Breast nodule 11/15/2014  . Breast pain, left 11/15/2014  . Carotid artery disease ([place])   . Dizziness   . Dyspnea    Previous CPX suggesting possible restrictive physiology, respiratory muscle fatigue, diastolic dysfunction  . Essential hypertension, benign   . Hyperlipidemia   . Oxygen dependent    2 liter  . Rib pain on left side 11/15/2014  . Seasonal allergies   . Shingles 05/04/2015  . Toe fracture, left    left big toe   Past Surgical History:  Procedure Laterality Date  . COLONOSCOPY  02/22/2012   Procedure: COLONOSCOPY;  Surgeon: Rogene Houston, MD;  Location: [facility];  Service: Endoscopy;  Laterality: N/A;  100  . KNEE SURGERY     bilateral    Allergies  Allergen Reactions  .  Sulfur Swelling    Rash also per patient    Outpatient Encounter Prescriptions as of 07/08/2017  Medication Sig  . acetaminophen (TYLENOL) 500 MG tablet Take 1,000 mg by mouth 3 (three) times daily.  Jearl Klinefelter ELLIPTA 62.5-25 MCG/INH AEPB Inhale 1 Inhaler into the lungs daily.   Marland Kitchen apixaban (ELIQUIS) 2.5 MG TABS tablet Take 1 tablet (2.5 mg total) by mouth 2 (two) times daily.  Marland Kitchen atorvastatin (LIPITOR) 80 MG tablet Take 1 tablet (80 mg total) by mouth daily at 6 PM.  . cycloSPORINE (RESTASIS) 0.05 % ophthalmic emulsion Place 1  drop into both eyes every 12 (twelve) hours.   . diclofenac sodium (VOLTAREN) 1 % GEL Apply 2 g topically 2 (two) times daily. Apply to right shoulder and to left shoulder and left knee and right knee  . diltiazem (CARDIZEM CD) 180 MG 24 hr capsule TAKE ONE CAPSULE BY MOUTH ONCE DAILY.  . ferrous sulfate (KP FERROUS SULFATE) 325 (65 FE) MG tablet Take 325 mg by mouth daily with breakfast.  . furosemide (LASIX) 40 MG tablet Take 40 mg by mouth daily.   Marland Kitchen gabapentin (NEURONTIN) 100 MG capsule Take 1 capsule (100 mg total) by mouth 3 (three) times daily.  . hydrocortisone (ANUSOL-HC) 2.5 % rectal cream Place 1 application rectally 2 (two) times daily as needed for hemorrhoids or anal itching.  . Multiple Vitamin (MULITIVITAMIN WITH MINERALS) TABS Take 1 tablet by mouth every other day.   Marland Kitchen omeprazole (PRILOSEC) 20 MG capsule Take 20 mg by mouth daily.    . OXYGEN Inhale 2 L into the lungs daily. Oxygen @@ 2 L/mn via qhs. Twice a day   . Polyethyl Glycol-Propyl Glycol (SYSTANE OP) Place 1 drop into both eyes 3 (three) times daily. For dry eyes  . polyethylene glycol (MIRALAX / GLYCOLAX) packet Take 17 g by mouth daily.  . potassium chloride (K-DUR,KLOR-CON) 10 MEQ tablet Take 20 mEq by mouth daily.  Marland Kitchen PROAIR HFA 108 (90 BASE) MCG/ACT inhaler 2 puffs 3 (three) times daily as needed for wheezing (cough).   . promethazine (PHENERGAN) 12.5 MG tablet Take 12.5 mg by mouth every 6 (six) hours as needed for nausea or vomiting.  . traMADol (ULTRAM) 50 MG tablet Take 50 mg by mouth 2 (two) times daily.   . [DISCONTINUED] aspirin 81 MG chewable tablet Chew 81 mg by mouth daily.   No facility-administered encounter medications on file as of 07/08/2017.     Review of Systems   In general she is not complaining of fever or chills says she feels weak.  Skin does not complain of diaphoresis or itching.  Head ears eyes nose mouth and throat does not really complain of visual changes does have a history of  right retinal vein occlusion with some blurriness of her right eye followed by ophthalmology.  Respiratory does not complain of increased shortness of breath from baseline or increased cough.  Cardiac denies chest pain has lower extremity edema possibly increased from last week.  GU does not specifically complain of dysuria.  Musculoskeletal has had complaints of right knee and leg pain but apparently this has improved secondary to a change in her pain medicines including addition of Neurontin-also on tramadol and Voltaren gel  Neurologic has complained of dizziness but is not complaining of that today while lying down apparently this was more when she stood up over the weekend.  Psych-appears somewhat anxious she does have a history of this  At times  Immunization History  Administered Date(s) Administered  . Influenza-Unspecified 10/10/2013   Pertinent  Health Maintenance Due  Topic Date Due  . INFLUENZA VACCINE  11/09/2017 (Originally 07/10/2017)  . DEXA SCAN  11/09/2017 (Originally 01/15/1986)  . PNA vac Low Risk Adult (1 of 2 - PCV13) 11/09/2017 (Originally 01/15/1986)   No flowsheet data found. Functional Status Survey:    Vitals:   07/08/17 1404  BP: (!) 181/61  Pulse: (!) 53  Resp: 18  Temp: 97.9 F (36.6 C)  TempSrc: Oral  Previous manual reading blood pressure was 144/70-- reading above  was done after patient apparently has had some activity  Pulse readings were consistently in the 50s  Physical Exam   In general list a pleasant elderly female who appears somewhat anxious about her abdominal discomfort initially sitting in a wheelchair later assessed in bed  Her skin is warm and dry.  Eyes didn't does have some blurriness of her right thigh which is baseline visual acuity appears grossly intact.  Oropharynx is clear mucous membranes moist.  Chest has somewhat shallow air entry but could not really appreciate overt congestion coria for was somewhat poor.  Heart  is irregular irregular rate and rhythm I got a rate in the low 50s she has 2-3+ lower extremity edema which is possibly slightly increased from when I saw her early last week.  Her abdomen is soft appears to be tender more so on the right upper quadrant --possibly some rebound tenderness although again this was somewhat difficult to truly assess since patient is somewhat anxious.  Musculoskeletal is able to move all extremities appears at baseline with some chronic right knee edema the area continues to be swollen but is non-erythematous and does not appear to be acutely warm.  Neurologic is grossly at baseline.  Moves  her extremities at baseline still has some expressive aphasia  Psych she appears to be at baseline alert and oriented pleasant and appropriate although somewhat anxious  Labs reviewed:  Recent Labs  06/20/17 0730 06/24/17 0715 07/05/17 0702  NA 137 138 140  K 3.9 4.1 4.2  CL 103 104 105  CO2 27 28 28   GLUCOSE 97 90 93  BUN 23* 24* 28*  CREATININE 0.81 0.73 1.01*  CALCIUM 8.8* 8.3* 8.5*    Recent Labs  12/24/16 0800 05/09/17 0720 06/04/17 0717  AST 28 23 19   ALT 16 16 13*  ALKPHOS 101 122 105  BILITOT 0.4 0.8 0.8  PROT 6.7 6.7 6.0*  ALBUMIN 3.7 3.6 3.3*    Recent Labs  06/24/17 0715 07/01/17 0600 07/05/17 0702 07/08/17 0500  WBC 7.3 7.2 8.5 13.1*  NEUTROABS 4.1  --  5.6 8.8*  HGB 9.5* 8.8* 8.8* 10.2*  HCT 29.3* 27.8* 27.4* 31.9*  MCV 88.3 87.7 87.3 88.4  PLT 256 265 234 274   Lab Results  Component Value Date   TSH 1.100 06/04/2017   Lab Results  Component Value Date   HGBA1C 5.5 12/06/2016   Lab Results  Component Value Date   CHOL 98 06/04/2017   HDL 50 06/04/2017   LDLCALC 40 06/04/2017   TRIG 40 06/04/2017   CHOLHDL 2.0 06/04/2017    Significant Diagnostic Results in last 30 days:  Dg Chest 2 View  Result Date: 06/24/2017 CLINICAL DATA:  Peripheral edema. EXAM: CHEST  2 VIEW COMPARISON:  06/19/2017. FINDINGS: Stable  enlarged cardiac silhouette. Clear lungs with normal vascularity. Marked bilateral shoulder degenerative changes. Superior migration of the humeral heads is again noted. Diffuse osteopenia. IMPRESSION:  No acute abnormality. Stable cardiomegaly and marked bilateral shoulder degenerative changes with evidence of chronic bilateral rotator cuff tears. Electronically Signed   By: Claudie Revering M.D.   On: 06/24/2017 19:29   Dg Chest 2 View  Result Date: 06/19/2017 CLINICAL DATA:  Shortness of Breath EXAM: CHEST  2 VIEW COMPARISON:  December 06, 2016 FINDINGS: There is scarring in the left base. There is no edema or consolidation. Heart is enlarged with pulmonary vascularity within normal limits. No adenopathy. There is aortic atherosclerosis. There is degenerative change in the thoracic spine. There is advanced arthropathy in both shoulders with superior migration of each humeral head. IMPRESSION: Scarring left base. No edema or consolidation. Stable cardiomegaly. There is aortic atherosclerosis. There is advanced arthropathy in each shoulder with superior migration of each humeral head, indicative of chronic rotator cuff tears. Aortic Atherosclerosis (ICD10-I70.0). Electronically Signed   By: Lowella Grip III M.D.   On: 06/19/2017 15:13   Dg Knee 1-2 Views Right  Result Date: 06/24/2017 CLINICAL DATA:  Right knee swelling. EXAM: RIGHT KNEE - 1-2 VIEW COMPARISON:  06/19/2017. FINDINGS: A right total knee prosthesis is again demonstrated. There is lucency and bone fragmentation surrounding all 3 prosthetic components with progressive posterior subluxation of the tibia relative to the femur. Multiple loose bodies are demonstrated, increased in number. There is a small effusion. IMPRESSION: 1. Total knee prosthesis with significant loosening of all 3 components again demonstrated. Again, associated infection cannot be excluded. 2. Progressive posterior subluxation of the tibia relative to the femur.  Electronically Signed   By: Claudie Revering M.D.   On: 06/24/2017 19:32   US Venous Img Lower Bilateral  Result Date: 06/24/2017 CLINICAL DATA:  Initial evaluation for lower extremity edema, chronic lower extremity pain for 1 year. EXAM: BILATERAL LOWER EXTREMITY VENOUS DOPPLER ULTRASOUND TECHNIQUE: Gray-scale sonography with graded compression, as well as color Doppler and duplex ultrasound were performed to evaluate the lower extremity deep venous systems from the level of the common femoral vein and including the common femoral, femoral, profunda femoral, popliteal and calf veins including the posterior tibial, peroneal and gastrocnemius veins when visible. The superficial great saphenous vein was also interrogated. Spectral Doppler was utilized to evaluate flow at rest and with distal augmentation maneuvers in the common femoral, femoral and popliteal veins. COMPARISON:  None. FINDINGS: RIGHT LOWER EXTREMITY Common Femoral Vein: No evidence of thrombus. Normal compressibility, respiratory phasicity and response to augmentation. Saphenofemoral Junction: No evidence of thrombus. Normal compressibility and flow on color Doppler imaging. Profunda Femoral Vein: No evidence of thrombus. Normal compressibility and flow on color Doppler imaging. Femoral Vein: No evidence of thrombus. Normal compressibility, respiratory phasicity and response to augmentation. Popliteal Vein: No evidence of thrombus. Normal compressibility, respiratory phasicity and response to augmentation. Calf Veins: No evidence of thrombus. Normal compressibility and flow on color Doppler imaging. Superficial Great Saphenous Vein: No evidence of thrombus. Normal compressibility and flow on color Doppler imaging. Venous Reflux:  None. Other Findings: Mild diffuse edema present within the subcutaneous soft tissues. LEFT LOWER EXTREMITY Common Femoral Vein: No evidence of thrombus. Normal compressibility, respiratory phasicity and response to  augmentation. Saphenofemoral Junction: No evidence of thrombus. Normal compressibility and flow on color Doppler imaging. Profunda Femoral Vein: No evidence of thrombus. Normal compressibility and flow on color Doppler imaging. Femoral Vein: No evidence of thrombus. Normal compressibility, respiratory phasicity and response to augmentation. Popliteal Vein: No evidence of thrombus. Normal compressibility, respiratory phasicity and response to augmentation. Calf Veins: No evidence  of thrombus. Normal compressibility and flow on color Doppler imaging. Superficial Great Saphenous Vein: No evidence of thrombus. Normal compressibility and flow on color Doppler imaging. Venous Reflux:  None. Other Findings: Mild diffuse edema present within the subcutaneous soft tissues. IMPRESSION: 1. No evidence of DVT within either lower extremity. 2. Mild diffuse edema within the subcutaneous soft tissues of both lower extremities. Electronically Signed   By: Jeannine Boga M.D.   On: 06/24/2017 16:13   Dg Knee Complete 4 Views Right  Result Date: 06/19/2017 CLINICAL DATA:  Chronic right scratch the right knee pain and swelling. History of prior knee arthroplasty. EXAM: RIGHT KNEE - COMPLETE 4+ VIEW COMPARISON:  Plain films and CT right knee 08/06/2016. FINDINGS: Extensive lucency is present about both the tibial and femoral components with fragmentation of bone worst about the femoral component. The femur is medially anteriorly subluxed off of the tibial component. The tibia is posteriorly subluxed relative to the femur. Extensive heterotopic calcification is seen on the lateral view. There is a joint effusion. IMPRESSION: Findings consistent with progressive loosening of the patient's right knee arthroplasty. Coexistent infection is possible. Negative for fracture. Electronically Signed   By: Inge Rise M.D.   On: 06/19/2017 15:38    Assessment/Plan  1 history of right-sided abdominal pain with elevated white  count-patient describes this pain is fairly significant and worsening-she states she's had her appendix and gallbladder removed per review of radiology studies iandrecent notes could not really confirm this-secondary to the pain and elevated white count and patient saying the pain is worsening we will send her to the ER for evaluation.   #2 history of diastolic CHF wit weight gain-again 1 concerned possibly about a CHF exasperations here although she does not complain of really any increased shortness of breath appears she's had some significant weight gain especially over the weekend associated with dizziness -- will await ER evaluation  #3 history of anemia-again she is followed by GI as well does have history of occult positive stools hemoglobin actually shown improvement today--at 10.2-she was recently started on iron-she is also on a proton pump inhibitor.  #4 AFIB- she is on diltiazem she is on Eliquis for anticoagulation-heart rate is somewhat low today on auscultation I got in the low 50s-       [id number]-of note greater than 35 minutes spent assessing patient-reassessing patient-reviewing her chart-reviewing her labs-and recent  iimaging studies-and coordinating and formulating a plan of care-

## 2017-07-08 NOTE — ED Notes (Signed)
Patient was given dinner tray and water to drink.

## 2017-07-08 NOTE — ED Notes (Signed)
Date and time results received: 07/08/17 6:34 PM  (use smartphrase ".now" to insert current time)  Test: Lactic Acid Critical Value: 2.1  Name of Provider Notified: Thurnell Garbe  Orders Received? Or Actions Taken?: Orders Received - See Orders for details

## 2017-07-08 NOTE — ED Triage Notes (Signed)
Patient from [facility] center for evaluation of leukocytosis and left lower abdominal pain x3 days. She complains of pain while voiding. Denies nausea/V/D.

## 2017-07-09 ENCOUNTER — Inpatient Hospital Stay
Admission: RE | Admit: 2017-07-09 | Discharge: 2017-07-14 | Disposition: A | Discharge status: Nursing Home (Includes ICF) | Payer: Medicare Other | Source: Ambulatory Visit | Attending: Internal Medicine | Admitting: Internal Medicine

## 2017-07-09 ENCOUNTER — Encounter (HOSPITAL_COMMUNITY): Payer: Self-pay

## 2017-07-09 ENCOUNTER — Observation Stay (HOSPITAL_BASED_OUTPATIENT_CLINIC_OR_DEPARTMENT_OTHER): Payer: Medicare Other

## 2017-07-09 ENCOUNTER — Telehealth: Payer: Self-pay

## 2017-07-09 DIAGNOSIS — I509 Heart failure, unspecified: Secondary | ICD-10-CM | POA: Diagnosis not present

## 2017-07-09 DIAGNOSIS — I5033 Acute on chronic diastolic (congestive) heart failure: Secondary | ICD-10-CM

## 2017-07-09 DIAGNOSIS — I110 Hypertensive heart disease with heart failure: Secondary | ICD-10-CM | POA: Diagnosis not present

## 2017-07-09 DIAGNOSIS — R600 Localized edema: Secondary | ICD-10-CM

## 2017-07-09 DIAGNOSIS — I340 Nonrheumatic mitral (valve) insufficiency: Secondary | ICD-10-CM | POA: Diagnosis not present

## 2017-07-09 DIAGNOSIS — I5032 Chronic diastolic (congestive) heart failure: Secondary | ICD-10-CM

## 2017-07-09 DIAGNOSIS — I4891 Unspecified atrial fibrillation: Secondary | ICD-10-CM | POA: Diagnosis not present

## 2017-07-09 LAB — MRSA PCR SCREENING: MRSA by PCR: NEGATIVE

## 2017-07-09 LAB — COMPREHENSIVE METABOLIC PANEL
ALK PHOS: 119 U/L (ref 38–126)
ALT: 22 U/L (ref 14–54)
ANION GAP: 9 (ref 5–15)
AST: 25 U/L (ref 15–41)
Albumin: 3.2 g/dL — ABNORMAL LOW (ref 3.5–5.0)
BUN: 24 mg/dL — ABNORMAL HIGH (ref 6–20)
CALCIUM: 8.8 mg/dL — AB (ref 8.9–10.3)
CO2: 27 mmol/L (ref 22–32)
Chloride: 106 mmol/L (ref 101–111)
Creatinine, Ser: 0.8 mg/dL (ref 0.44–1.00)
GFR calc non Af Amer: >60 mL/min (ref >60)
Glucose, Bld: 96 mg/dL (ref 65–99)
POTASSIUM: 3.8 mmol/L (ref 3.5–5.1)
SODIUM: 142 mmol/L (ref 135–145)
Total Bilirubin: 0.7 mg/dL (ref 0.3–1.2)
Total Protein: 6.1 g/dL — ABNORMAL LOW (ref 6.5–8.1)

## 2017-07-09 LAB — CBC
HCT: 29 % — ABNORMAL LOW (ref 36.0–46.0)
HEMOGLOBIN: 9.3 g/dL — AB (ref 12.0–15.0)
MCH: 27.7 pg (ref 26.0–34.0)
MCHC: 32.1 g/dL (ref 30.0–36.0)
MCV: 86.3 fL (ref 78.0–100.0)
PLATELETS: 248 10*3/uL (ref 150–400)
RBC: 3.36 MIL/uL — ABNORMAL LOW (ref 3.87–5.11)
RDW: 13.6 % (ref 11.5–15.5)
WBC: 7.6 10*3/uL (ref 4.0–10.5)

## 2017-07-09 LAB — OCCULT BLOOD X 1 CARD TO LAB, STOOL: Fecal Occult Bld: NEGATIVE

## 2017-07-09 LAB — ECHOCARDIOGRAM COMPLETE
HEIGHTINCHES: 64 in
WEIGHTICAEL: 2246.93 [oz_av]

## 2017-07-09 MED ORDER — ONDANSETRON HCL 4 MG PO TABS: 4.0000 mg | ORAL_TABLET | Freq: Four times a day (QID) | ORAL | Status: DC | PRN

## 2017-07-09 MED ORDER — DICLOFENAC SODIUM 1 % TD GEL
2.0000 g | Freq: Two times a day (BID) | TRANSDERMAL | Status: DC
  Administered 2017-07-09: 11:00:00 2 g via TOPICAL
  Filled 2017-07-09: qty 100

## 2017-07-09 MED ORDER — ACETAMINOPHEN 500 MG PO TABS
1000.0000 mg | ORAL_TABLET | Freq: Three times a day (TID) | ORAL | Status: DC
  Administered 2017-07-09 (×2): 1000 mg via ORAL
  Filled 2017-07-09 (×2): qty 2

## 2017-07-09 MED ORDER — PROMETHAZINE HCL 12.5 MG PO TABS: 12.5000 mg | ORAL_TABLET | Freq: Four times a day (QID) | ORAL | Status: DC | PRN

## 2017-07-09 MED ORDER — FUROSEMIDE 20 MG PO TABS
20.0000 mg | ORAL_TABLET | Freq: Once | ORAL | Status: AC
  Administered 2017-07-09: 20 mg via ORAL
  Filled 2017-07-09: qty 1

## 2017-07-09 MED ORDER — FUROSEMIDE 40 MG PO TABS: 60.0000 mg | ORAL_TABLET | Freq: Every day | ORAL | Status: DC

## 2017-07-09 MED ORDER — SODIUM CHLORIDE 0.9 % IV SOLN
INTRAVENOUS | Status: DC
  Administered 2017-07-09: via INTRAVENOUS

## 2017-07-09 MED ORDER — ALBUTEROL SULFATE (2.5 MG/3ML) 0.083% IN NEBU: 3.0000 mL | INHALATION_SOLUTION | Freq: Four times a day (QID) | RESPIRATORY_TRACT | Status: DC | PRN

## 2017-07-09 MED ORDER — ACETAMINOPHEN 325 MG PO TABS: 650.0000 mg | ORAL_TABLET | Freq: Four times a day (QID) | ORAL | Status: DC | PRN

## 2017-07-09 MED ORDER — ATORVASTATIN CALCIUM 40 MG PO TABS: 80.0000 mg | ORAL_TABLET | Freq: Every day | ORAL | Status: DC

## 2017-07-09 MED ORDER — UMECLIDINIUM-VILANTEROL 62.5-25 MCG/INH IN AEPB
1.0000 | INHALATION_SPRAY | Freq: Every day | RESPIRATORY_TRACT | Status: DC
  Administered 2017-07-09: 1 via RESPIRATORY_TRACT
  Filled 2017-07-09: qty 14

## 2017-07-09 MED ORDER — FUROSEMIDE 40 MG PO TABS
40.0000 mg | ORAL_TABLET | Freq: Every day | ORAL | Status: DC
  Administered 2017-07-09: 40 mg via ORAL
  Filled 2017-07-09: qty 1

## 2017-07-09 MED ORDER — GABAPENTIN 100 MG PO CAPS
100.0000 mg | ORAL_CAPSULE | Freq: Three times a day (TID) | ORAL | Status: DC
  Administered 2017-07-09 (×3): 100 mg via ORAL
  Filled 2017-07-09 (×3): qty 1

## 2017-07-09 MED ORDER — POLYETHYL GLYCOL-PROPYL GLYCOL 0.4-0.3 % OP GEL
Freq: Three times a day (TID) | OPHTHALMIC | Status: DC
  Filled 2017-07-09: qty 10

## 2017-07-09 MED ORDER — POLYVINYL ALCOHOL 1.4 % OP SOLN
1.0000 [drp] | Freq: Three times a day (TID) | OPHTHALMIC | Status: DC
  Administered 2017-07-09 (×2): 1 [drp] via OPHTHALMIC
  Filled 2017-07-09: qty 15

## 2017-07-09 MED ORDER — APIXABAN 2.5 MG PO TABS
2.5000 mg | ORAL_TABLET | Freq: Two times a day (BID) | ORAL | Status: DC
  Administered 2017-07-09 (×2): 2.5 mg via ORAL
  Filled 2017-07-09 (×2): qty 1

## 2017-07-09 MED ORDER — PANTOPRAZOLE SODIUM 40 MG PO TBEC
40.0000 mg | DELAYED_RELEASE_TABLET | Freq: Every day | ORAL | Status: DC
  Administered 2017-07-09: 40 mg via ORAL
  Filled 2017-07-09: qty 1

## 2017-07-09 MED ORDER — FUROSEMIDE 20 MG PO TABS: 60.0000 mg | ORAL_TABLET | Freq: Every day | ORAL | 1 refills | Status: DC

## 2017-07-09 MED ORDER — ACETAMINOPHEN 650 MG RE SUPP: 650.0000 mg | Freq: Four times a day (QID) | RECTAL | Status: DC | PRN

## 2017-07-09 MED ORDER — ONDANSETRON HCL 4 MG/2ML IJ SOLN: 4.0000 mg | Freq: Four times a day (QID) | INTRAMUSCULAR | Status: DC | PRN

## 2017-07-09 MED ORDER — DILTIAZEM HCL ER COATED BEADS 180 MG PO CP24
180.0000 mg | ORAL_CAPSULE | Freq: Every day | ORAL | Status: DC
  Administered 2017-07-09: 180 mg via ORAL
  Filled 2017-07-09: qty 1

## 2017-07-09 MED ORDER — HYDROCORTISONE 2.5 % RE CREA
1.0000 "application " | TOPICAL_CREAM | Freq: Two times a day (BID) | RECTAL | Status: DC | PRN
  Filled 2017-07-09: qty 28.35

## 2017-07-09 MED ORDER — FERROUS SULFATE 325 (65 FE) MG PO TABS
325.0000 mg | ORAL_TABLET | Freq: Every day | ORAL | Status: DC
  Administered 2017-07-09: 325 mg via ORAL
  Filled 2017-07-09: qty 1

## 2017-07-09 MED ORDER — CYCLOSPORINE 0.05 % OP EMUL
1.0000 [drp] | Freq: Two times a day (BID) | OPHTHALMIC | Status: DC
  Administered 2017-07-09 (×2): 1 [drp] via OPHTHALMIC
  Filled 2017-07-09 (×2): qty 1

## 2017-07-09 MED ORDER — POTASSIUM CHLORIDE CRYS ER 20 MEQ PO TBCR
20.0000 meq | EXTENDED_RELEASE_TABLET | Freq: Every day | ORAL | Status: DC
  Administered 2017-07-09: 20 meq via ORAL
  Filled 2017-07-09: qty 1

## 2017-07-09 MED ORDER — TRAMADOL HCL 50 MG PO TABS
50.0000 mg | ORAL_TABLET | Freq: Two times a day (BID) | ORAL | Status: DC
  Administered 2017-07-09 (×2): 50 mg via ORAL
  Filled 2017-07-09 (×2): qty 1

## 2017-07-09 NOTE — Progress Notes (Addendum)
LCSW consulted as patient admitted from [facility]  Patient is a long term care resident, admitted under observation. Call placed to facility in effort for return and understand baseline.  Message left and awaiting call back from facility and MD plans.  Will follow up.    Plan: [facility] when medically stable.  Patient medically stable for discharge to return to SNF.  Patient will transport by [facility] staff.  Facility made aware and clinicals sent to facility for review. Patient and family aware of plan.  Lane Hacker, MSW Clinical Social Work: [employer] Coverage for :  [phone]

## 2017-07-09 NOTE — Discharge Summary (Signed)
Physician Discharge Summary  EDESSA JAKUBOWICZ YIF:[id number] DOB: Feb 24, 1919 DOA: 07/08/2017  PCP: Virgie Dad, MD  Admit date: 07/08/2017 Discharge date: 07/09/2017  Admitted From: SNF Disposition:  To SNF.  Recommendations for Outpatient Follow-up:  1. Follow up with PCP in 1-2 weeks 2. Please obtain BMP/CBC in one week 3. Appointment scheduled on August 16th with cardiology.   Home Health:No.  Equipment/Devices: None.  Discharge Condition: No abdominal pain.  Able to eat.  No SOB.  CODE STATUS: DNR.  Diet recommendation: Cardiac diet.   Brief/Interim Summary:  Patient was admitted for abdominal pain, and mildly volume overload by Dr Darrick Meigs on July 30th, 2018.  As per his H and P:  "  Deziyah Arvin  is a 83 y.o. female, With history of atrial fibrillation, on anticoagulation with eliquis ,chronic diastole CHF, COPD, hypertension who was brought to the ED with abdominal pain for past several days. Patient has lower extremity swelling the dose of Lasix was increased 40 mg daily on 06/19/2017 due to weight gain. She denies nausea vomiting or diarrhea. No chest pain or shortness of breath. She denies fever, dysuria. Patient is chronically bedbound due to the right knee pain. And shoulder pain. Patient is not candidate for surgery due to CHF as the patient's family member  In the ED, CT scan of the abdomen and pelvis showed extensive chronic diverticulosis without diverticulitis, small bilateral pleural effusions. Patient's abdominal pain has resolved at this time. She does have history of constipation last bowel movement was this morning.  Patient was given Lasix 40 mg IV for  bilateral lower extremity edema  HOSPITAL COURSE:  Patient was admitted into the hospital, and after the initial IV lasix, she diuresed 1.3 L without any problem.  She denied SOB or CP.  Her abdominal pain resolved, and she was able to eat.  She has no other symptoms.  She was seen by cardiology, and had an ECHO,  result pending.  Dr Harl Bowie recommended that she take a higher dose of Lasix, therefore, she was discharged on 60mg  per day.  She will continue all her home meds, including her Eliquis.  Appointment was made for August 16 to see her cardiologist, and follow up PCP next week as scheduled.  She remained a DNR during this admission as well.  Thank you for allowing me to participate in her care.  Good Day.   Discharge Diagnoses:  Active Problems:   Atrial fibrillation (HCC)   Chronic diastolic heart failure (HCC)   COPD (chronic obstructive pulmonary disease) (HCC)   Bilateral leg edema    Discharge Instructions  Discharge Instructions    Diet - low sodium heart healthy    Complete by:  As directed    Discharge instructions    Complete by:  As directed    You have an appointment on August 16 with the cardiologist.  Your Lasix dose has been increased to 60mg  per day.   Increase activity slowly    Complete by:  As directed      Allergies as of 07/09/2017      Reactions   Sulfur Swelling   Rash also per patient      Medication List    STOP taking these medications   diclofenac sodium 1 % Gel Commonly known as:  VOLTAREN     TAKE these medications   acetaminophen 500 MG tablet Commonly known as:  TYLENOL Take 1,000 mg by mouth 3 (three) times daily.   ANORO ELLIPTA 62.5-25  MCG/INH Aepb Generic drug:  umeclidinium-vilanterol Inhale 1 Inhaler into the lungs daily.   apixaban 2.5 MG Tabs tablet Commonly known as:  ELIQUIS Take 1 tablet (2.5 mg total) by mouth 2 (two) times daily.   aspirin EC 81 MG tablet Take 81 mg by mouth daily.   atorvastatin 80 MG tablet Commonly known as:  LIPITOR Take 1 tablet (80 mg total) by mouth daily at 6 PM.   carbamide peroxide 6.5 % OTIC solution Commonly known as:  DEBROX Place 10 drops into the left ear 2 (two) times daily.   cycloSPORINE 0.05 % ophthalmic emulsion Commonly known as:  RESTASIS Place 1 drop into both eyes every 12  (twelve) hours.   diltiazem 180 MG 24 hr capsule Commonly known as:  CARDIZEM CD TAKE ONE CAPSULE BY MOUTH ONCE DAILY.   furosemide 20 MG tablet Commonly known as:  LASIX Take 3 tablets (60 mg total) by mouth daily. What changed:  medication strength  how much to take   gabapentin 100 MG capsule Commonly known as:  NEURONTIN Take 1 capsule (100 mg total) by mouth 3 (three) times daily.   hydrocortisone 2.5 % rectal cream Commonly known as:  ANUSOL-HC Place 1 application rectally 2 (two) times daily as needed for hemorrhoids or anal itching.   KP FERROUS SULFATE 325 (65 FE) MG tablet Generic drug:  ferrous sulfate Take 325 mg by mouth daily with breakfast.   multivitamin with minerals Tabs tablet Take 1 tablet by mouth every other day.   omeprazole 20 MG capsule Commonly known as:  PRILOSEC Take 20 mg by mouth daily.   OXYGEN Inhale 2 L into the lungs daily. Oxygen @@ 2 L/mn via qhs. Twice a day   polyethylene glycol packet Commonly known as:  MIRALAX / GLYCOLAX Take 17 g by mouth daily.   potassium chloride 10 MEQ tablet Commonly known as:  K-DUR,KLOR-CON Take 20 mEq by mouth daily.   PROAIR HFA 108 (90 Base) MCG/ACT inhaler Generic drug:  albuterol 2 puffs 3 (three) times daily as needed for wheezing (cough).   promethazine 12.5 MG tablet Commonly known as:  PHENERGAN Take 12.5 mg by mouth every 6 (six) hours as needed for nausea or vomiting.   SYSTANE OP Place 1 drop into both eyes 3 (three) times daily. For dry eyes   traMADol 50 MG tablet Commonly known as:  ULTRAM Take 50 mg by mouth 2 (two) times daily.       Contact information for follow-up providers    Erlene Quan, PA-C Follow up on 07/25/2017.   Specialties:  Cardiology, Radiology Why:  [place] office. 1:30 pm  Contact information: [place] [address] [phone]            Contact information for after-discharge care    [facility] Follow up.   Specialty:  Skilled Nursing Facility Contact information: [address] [place] [address] [phone]                 Allergies  Allergen Reactions  . Sulfur Swelling    Rash also per patient    Consultations:  Cardiology.    Procedures/Studies: Dg Chest 2 View  Result Date: 07/08/2017 CLINICAL DATA:  Abdominal pain EXAM: CHEST  2 VIEW COMPARISON:  06/24/2017 FINDINGS: Stable cardiac enlargement. There is evidence of associated mild interstitial edema/CHF. Probable tiny amount of bilateral posterior pleural fluid without significant pleural effusions. No focal  airspace consolidation or pneumothorax. IMPRESSION: Cardiomegaly with associated mild interstitial edema/ CHF. Electronically Signed   By: Aletta Edouard M.D.   On: 07/08/2017 19:04   Dg Chest 2 View  Result Date: 06/24/2017 CLINICAL DATA:  Peripheral edema. EXAM: CHEST  2 VIEW COMPARISON:  06/19/2017. FINDINGS: Stable enlarged cardiac silhouette. Clear lungs with normal vascularity. Marked bilateral shoulder degenerative changes. Superior migration of the humeral heads is again noted. Diffuse osteopenia. IMPRESSION: No acute abnormality. Stable cardiomegaly and marked bilateral shoulder degenerative changes with evidence of chronic bilateral rotator cuff tears. Electronically Signed   By: Claudie Revering M.D.   On: 06/24/2017 19:29   Dg Chest 2 View  Result Date: 06/19/2017 CLINICAL DATA:  Shortness of Breath EXAM: CHEST  2 VIEW COMPARISON:  December 06, 2016 FINDINGS: There is scarring in the left base. There is no edema or consolidation. Heart is enlarged with pulmonary vascularity within normal limits. No adenopathy. There is aortic atherosclerosis. There is degenerative change in the thoracic spine. There is advanced arthropathy in both shoulders with superior migration of each humeral head. IMPRESSION: Scarring left base. No edema or consolidation. Stable  cardiomegaly. There is aortic atherosclerosis. There is advanced arthropathy in each shoulder with superior migration of each humeral head, indicative of chronic rotator cuff tears. Aortic Atherosclerosis (ICD10-I70.0). Electronically Signed   By: Lowella Grip III M.D.   On: 06/19/2017 15:13   Dg Knee 1-2 Views Right  Result Date: 06/24/2017 CLINICAL DATA:  Right knee swelling. EXAM: RIGHT KNEE - 1-2 VIEW COMPARISON:  06/19/2017. FINDINGS: A right total knee prosthesis is again demonstrated. There is lucency and bone fragmentation surrounding all 3 prosthetic components with progressive posterior subluxation of the tibia relative to the femur. Multiple loose bodies are demonstrated, increased in number. There is a small effusion. IMPRESSION: 1. Total knee prosthesis with significant loosening of all 3 components again demonstrated. Again, associated infection cannot be excluded. 2. Progressive posterior subluxation of the tibia relative to the femur. Electronically Signed   By: Claudie Revering M.D.   On: 06/24/2017 19:32   Ct Abdomen Pelvis W Contrast  Result Date: 07/08/2017 CLINICAL DATA:  Left lower abdominal pain x3 days, dysuria, leukocytosis EXAM: CT ABDOMEN AND PELVIS WITH CONTRAST TECHNIQUE: Multidetector CT imaging of the abdomen and pelvis was performed using the standard protocol following bolus administration of intravenous contrast. CONTRAST:  67mL ISOVUE-300 IOPAMIDOL (ISOVUE-300) INJECTION 61% COMPARISON:  None. FINDINGS: Lower chest: Small bilateral pleural effusions. Mild bibasilar atelectasis. Hepatobiliary: Mildly nodular hepatic contour. Calcified hepatic granulomata. Status post cholecystectomy. No intrahepatic or extrahepatic duct dilatation. Common duct measures 10 mm and smoothly tapers at the ampulla, likely postsurgical. Pancreas: Within normal limits.  No pancreatic ductal dilatation. Spleen: Normal in size. Adrenals/Urinary Tract: Adrenal glands within normal limits. Kidneys are  within normal limits.  No hydronephrosis. Bladder is within normal limits. Stomach/Bowel: Stomach is within normal limits. Moderate duodenal diverticulum (series 2/ image 36). No evidence of bowel obstruction. Appendix not discretely visualized. Extensive left colonic diverticulosis, without evidence of diverticulitis. Vascular/Lymphatic: No evidence of abdominal aortic aneurysm. Atherosclerotic calcifications of the abdominal aorta and branch vessels. No suspicious abdominopelvic lymphadenopathy. Reproductive: Status post hysterectomy. No adnexal masses. Other: Small volume abdominopelvic ascites, centered in the porta hepatis, along the right pericolic gutter, and in the right pelvis. Musculoskeletal: Mild degenerative changes of the visualized thoracolumbar spine. IMPRESSION: Extensive colonic diverticulosis, without evidence of diverticulitis. No evidence of bowel obstruction. Small volume right abdominopelvic ascites, as above. Mildly nodular hepatic contour, raising  the possibility of cirrhosis. Small bilateral pleural effusions. Additional ancillary findings as above. Electronically Signed   By: Julian Hy M.D.   On: 07/08/2017 18:59   US Venous Img Lower Bilateral  Result Date: 06/24/2017 CLINICAL DATA:  Initial evaluation for lower extremity edema, chronic lower extremity pain for 1 year. EXAM: BILATERAL LOWER EXTREMITY VENOUS DOPPLER ULTRASOUND TECHNIQUE: Gray-scale sonography with graded compression, as well as color Doppler and duplex ultrasound were performed to evaluate the lower extremity deep venous systems from the level of the common femoral vein and including the common femoral, femoral, profunda femoral, popliteal and calf veins including the posterior tibial, peroneal and gastrocnemius veins when visible. The superficial great saphenous vein was also interrogated. Spectral Doppler was utilized to evaluate flow at rest and with distal augmentation maneuvers in the common femoral,  femoral and popliteal veins. COMPARISON:  None. FINDINGS: RIGHT LOWER EXTREMITY Common Femoral Vein: No evidence of thrombus. Normal compressibility, respiratory phasicity and response to augmentation. Saphenofemoral Junction: No evidence of thrombus. Normal compressibility and flow on color Doppler imaging. Profunda Femoral Vein: No evidence of thrombus. Normal compressibility and flow on color Doppler imaging. Femoral Vein: No evidence of thrombus. Normal compressibility, respiratory phasicity and response to augmentation. Popliteal Vein: No evidence of thrombus. Normal compressibility, respiratory phasicity and response to augmentation. Calf Veins: No evidence of thrombus. Normal compressibility and flow on color Doppler imaging. Superficial Great Saphenous Vein: No evidence of thrombus. Normal compressibility and flow on color Doppler imaging. Venous Reflux:  None. Other Findings: Mild diffuse edema present within the subcutaneous soft tissues. LEFT LOWER EXTREMITY Common Femoral Vein: No evidence of thrombus. Normal compressibility, respiratory phasicity and response to augmentation. Saphenofemoral Junction: No evidence of thrombus. Normal compressibility and flow on color Doppler imaging. Profunda Femoral Vein: No evidence of thrombus. Normal compressibility and flow on color Doppler imaging. Femoral Vein: No evidence of thrombus. Normal compressibility, respiratory phasicity and response to augmentation. Popliteal Vein: No evidence of thrombus. Normal compressibility, respiratory phasicity and response to augmentation. Calf Veins: No evidence of thrombus. Normal compressibility and flow on color Doppler imaging. Superficial Great Saphenous Vein: No evidence of thrombus. Normal compressibility and flow on color Doppler imaging. Venous Reflux:  None. Other Findings: Mild diffuse edema present within the subcutaneous soft tissues. IMPRESSION: 1. No evidence of DVT within either lower extremity. 2. Mild diffuse  edema within the subcutaneous soft tissues of both lower extremities. Electronically Signed   By: Jeannine Boga M.D.   On: 06/24/2017 16:13   Dg Knee Complete 4 Views Right  Result Date: 06/19/2017 CLINICAL DATA:  Chronic right scratch the right knee pain and swelling. History of prior knee arthroplasty. EXAM: RIGHT KNEE - COMPLETE 4+ VIEW COMPARISON:  Plain films and CT right knee 08/06/2016. FINDINGS: Extensive lucency is present about both the tibial and femoral components with fragmentation of bone worst about the femoral component. The femur is medially anteriorly subluxed off of the tibial component. The tibia is posteriorly subluxed relative to the femur. Extensive heterotopic calcification is seen on the lateral view. There is a joint effusion. IMPRESSION: Findings consistent with progressive loosening of the patient's right knee arthroplasty. Coexistent infection is possible. Negative for fracture. Electronically Signed   By: Inge Rise M.D.   On: 06/19/2017 15:38       Subjective:  No complaints.   Discharge Exam: Vitals:   07/08/17 2330 07/09/17 0010  BP: (!) 139/52 (!) 123/95  Pulse: (!) 52 66  Resp: 20 18  Temp:  98.2 F (36.8 C)   Vitals:   07/08/17 2300 07/08/17 2330 07/09/17 0010 07/09/17 1016  BP: 134/87 (!) 139/52 (!) 123/95   Pulse: 82 (!) 52 66   Resp: (!) 26 20 18    Temp:   98.2 F (36.8 C)   TempSrc:   Oral   SpO2: 93% 99% 96% 98%  Weight:   63.7 kg (140 lb 6.9 oz)   Height:        General: Pt is alert, awake, not in acute distress Cardiovascular: RRR, S1/S2 +, no rubs, no gallops Respiratory: CTA bilaterally, no wheezing, no rhonchi Abdominal: Soft, NT, ND, bowel sounds + Extremities: no edema, no cyanosis    The results of significant diagnostics from this hospitalization (including imaging, microbiology, ancillary and laboratory) are listed below for reference.     Microbiology: Recent Results (from the past 240 hour(s))  MRSA PCR  Screening     Status: None   Collection Time: 07/09/17  6:04 AM  Result Value Ref Range Status   MRSA by PCR NEGATIVE NEGATIVE Final    Comment:        The GeneXpert MRSA Assay (FDA approved for NASAL specimens only), is one component of a comprehensive MRSA colonization surveillance program. It is not intended to diagnose MRSA infection nor to guide or monitor treatment for MRSA infections.      Labs: BNP (last 3 results)  Recent Labs  12/06/16 0615 06/19/17 1230 07/08/17 1641  BNP 215.0* 311.0* 277.8*   Basic Metabolic Panel:  Recent Labs Lab 07/05/17 0702 07/08/17 1641 07/09/17 0705  NA 140 138 142  K 4.2 4.7 3.8  CL 105 104 106  CO2 28 23 27   GLUCOSE 93 94 96  BUN 28* 29* 24*  CREATININE 1.01* 1.01* 0.80  CALCIUM 8.5* 8.9 8.8*   Liver Function Tests:  Recent Labs Lab 07/08/17 1641 07/09/17 0705  AST 33 25  ALT 25 22  ALKPHOS 134* 119  BILITOT 0.8 0.7  PROT 6.8 6.1*  ALBUMIN 3.6 3.2*    Recent Labs Lab 07/08/17 1641  LIPASE 26   No results for input(s): AMMONIA in the last 168 hours. CBC:  Recent Labs Lab 07/05/17 0702 07/08/17 0500 07/08/17 1641 07/09/17 0705  WBC 8.5 13.1* 8.9 7.6  NEUTROABS 5.6 8.8* 6.2  --   HGB 8.8* 10.2* 10.2* 9.3*  HCT 27.4* 31.9* 32.1* 29.0*  MCV 87.3 88.4 87.0 86.3  PLT 234 274 273 248   Cardiac Enzymes:  Recent Labs Lab 07/08/17 1641  TROPONINI <0.03   Urinalysis    Component Value Date/Time   COLORURINE YELLOW 07/08/2017 1532   APPEARANCEUR CLEAR 07/08/2017 1532   LABSPEC 1.010 07/08/2017 1532   PHURINE 6.0 07/08/2017 1532   GLUCOSEU NEGATIVE 07/08/2017 1532   HGBUR NEGATIVE 07/08/2017 1532   BILIRUBINUR NEGATIVE 07/08/2017 1532   KETONESUR NEGATIVE 07/08/2017 1532   PROTEINUR NEGATIVE 07/08/2017 1532   UROBILINOGEN 1.0 10/13/2015 1351   NITRITE NEGATIVE 07/08/2017 1532   LEUKOCYTESUR TRACE (A) 07/08/2017 1532    Recent Results (from the past 240 hour(s))  MRSA PCR Screening      Status: None   Collection Time: 07/09/17  6:04 AM  Result Value Ref Range Status   MRSA by PCR NEGATIVE NEGATIVE Final    Comment:        The GeneXpert MRSA Assay (FDA approved for NASAL specimens only), is one component of a comprehensive MRSA colonization surveillance program. It is not intended to diagnose MRSA infection nor  to guide or monitor treatment for MRSA infections.     Time coordinating discharge: Over 30 minutes SIGNED:  Orvan Falconer, MD FACP [facility] 07/09/2017, 12:50 PM   If 7PM-7AM, please contact night-coverage [email or web address] Password [email or web address]

## 2017-07-09 NOTE — Progress Notes (Signed)
*  PRELIMINARY RESULTS* Echocardiogram 2D Echocardiogram has been performed.  Leavy Cella 07/09/2017, 2:36 PM

## 2017-07-09 NOTE — Care Management Obs Status (Signed)
[place] NOTIFICATION   Patient Details  Name: Wendy Perkins MRN: [id number] Date of Birth: [birth year]   Medicare Observation Status Notification Given:  No (discharged with 24 hours)    Aily Tzeng, Chauncey Reading, RN 07/09/2017, 12:52 PM

## 2017-07-09 NOTE — Telephone Encounter (Signed)
Possible re-admission to facility. This is a patient you were seeing at [facility]. [facility] F/U is needed if patient was re-admitted to facility upon discharge. Hospital discharge from [place] on 07/09/17

## 2017-07-09 NOTE — Telephone Encounter (Signed)
Error

## 2017-07-09 NOTE — Progress Notes (Signed)
Attempted to call report x2 to [facility]. 1st attempt, on hold for over 8 minutes. 2nd attempt, no one answered after letting phone ring for over 3 minutes.

## 2017-07-09 NOTE — Progress Notes (Signed)
Wendy Perkins discharged [facility], Skilled nursing facility per MD order.  Discharge instructions reviewed and discussed with the patient, all questions and concerns answered. Copy of instructions and scripts sent with pt. Report Called to receiving nurse.  Allergies as of 07/09/2017      Reactions   Sulfur Swelling   Rash also per patient      Medication List    STOP taking these medications   diclofenac sodium 1 % Gel Commonly known as:  VOLTAREN     TAKE these medications   acetaminophen 500 MG tablet Commonly known as:  TYLENOL Take 1,000 mg by mouth 3 (three) times daily.   ANORO ELLIPTA 62.5-25 MCG/INH Aepb Generic drug:  umeclidinium-vilanterol Inhale 1 Inhaler into the lungs daily.   apixaban 2.5 MG Tabs tablet Commonly known as:  ELIQUIS Take 1 tablet (2.5 mg total) by mouth 2 (two) times daily.   aspirin EC 81 MG tablet Take 81 mg by mouth daily.   atorvastatin 80 MG tablet Commonly known as:  LIPITOR Take 1 tablet (80 mg total) by mouth daily at 6 PM.   carbamide peroxide 6.5 % OTIC solution Commonly known as:  DEBROX Place 10 drops into the left ear 2 (two) times daily.   cycloSPORINE 0.05 % ophthalmic emulsion Commonly known as:  RESTASIS Place 1 drop into both eyes every 12 (twelve) hours.   diltiazem 180 MG 24 hr capsule Commonly known as:  CARDIZEM CD TAKE ONE CAPSULE BY MOUTH ONCE DAILY.   furosemide 20 MG tablet Commonly known as:  LASIX Take 3 tablets (60 mg total) by mouth daily. What changed:  medication strength  how much to take   gabapentin 100 MG capsule Commonly known as:  NEURONTIN Take 1 capsule (100 mg total) by mouth 3 (three) times daily.   hydrocortisone 2.5 % rectal cream Commonly known as:  ANUSOL-HC Place 1 application rectally 2 (two) times daily as needed for hemorrhoids or anal itching.   KP FERROUS SULFATE 325 (65 FE) MG tablet Generic drug:  ferrous sulfate Take 325 mg by mouth daily with  breakfast.   multivitamin with minerals Tabs tablet Take 1 tablet by mouth every other day.   omeprazole 20 MG capsule Commonly known as:  PRILOSEC Take 20 mg by mouth daily.   OXYGEN Inhale 2 L into the lungs daily. Oxygen @@ 2 L/mn via qhs. Twice a day   polyethylene glycol packet Commonly known as:  MIRALAX / GLYCOLAX Take 17 g by mouth daily.   potassium chloride 10 MEQ tablet Commonly known as:  K-DUR,KLOR-CON Take 20 mEq by mouth daily.   PROAIR HFA 108 (90 Base) MCG/ACT inhaler Generic drug:  albuterol 2 puffs 3 (three) times daily as needed for wheezing (cough).   promethazine 12.5 MG tablet Commonly known as:  PHENERGAN Take 12.5 mg by mouth every 6 (six) hours as needed for nausea or vomiting.   SYSTANE OP Place 1 drop into both eyes 3 (three) times daily. For dry eyes   traMADol 50 MG tablet Commonly known as:  ULTRAM Take 50 mg by mouth 2 (two) times daily.       Patients skin is clean, dry and intact, no evidence of skin break down. IV site discontinued and catheter remains intact. Site without signs and symptoms of complications. Dressing and pressure applied.  Patient escorted to [facility] by NT in a wheelchair,  no distress noted upon discharge.  Ralene Muskrat Yarel Kilcrease 07/09/2017 5:35 PM

## 2017-07-09 NOTE — Consult Note (Signed)
Cardiology Consultation:   Patient ID: Wendy Perkins; [id number]; [birth year]   Admit date: 07/08/2017 Date of Consult: 07/09/2017  Primary Care Provider: Virgie Dad, MD Primary Cardiologist: Domenic Polite  Patient Profile:   Wendy Perkins is a 83 y.o. female with a hx of atrial fib on Eliquis, chronic NYHA Class II-III dyspnea, chronic diastolic CHF, and COPD, HTN, who is being seen today for the evaluation diastolic CHF of at the request of Dr. Truman Hayward, Hospitalist.   History of Present Illness:   Wendy Perkins was admitted from ER with complaints of abdominal pain, LEE, She has not been seen by Dr. Domenic Polite since 2015.  She resides at the [facility] and has medications adjusted there. She is a poor historian, history is provided by review of current and past medical records.   On arrival to emergency room, blood pressure 181/61, heart rate 53, she was afebrile. Pertinent labs creatinine 1.01, BUN 29. Hemoglobin 10.2, hematocrit 32.1. BNP 360.0. Chest x-ray revealed cardiomegaly with associated mild interstitial edema and CHF, EKG revealed atrial fibrillation/flutter heart rate of 59 bpm. . She was treated with IV Lasix 40 mg 1.  Believes her symptoms began last week sometime. No complaints of chest pain, has chronic dyspnea. Does not ambulate due to right knee replacement pain. She has diuresed 1.3 liters since admission. States she is feeling some better. Stomach pain is resolving.   Past Medical History:  Diagnosis Date  . Atrial fibrillation ([place])   . Breast nodule 11/15/2014  . Breast pain, left 11/15/2014  . Carotid artery disease ([place])   . Dizziness   . Dyspnea    Previous CPX suggesting possible restrictive physiology, respiratory muscle fatigue, diastolic dysfunction  . Essential hypertension, benign   . Hyperlipidemia   . Oxygen dependent    2 liter  . Rib pain on left side 11/15/2014  . Seasonal allergies   . Shingles 05/04/2015  . Toe fracture, left    left big toe     Past Surgical History:  Procedure Laterality Date  . COLONOSCOPY  02/22/2012   Procedure: COLONOSCOPY;  Surgeon: Rogene Houston, MD;  [facility];  Service: Endoscopy;  Laterality: N/A;  100  . KNEE SURGERY     bilateral     Inpatient Medications: Scheduled Meds: . acetaminophen  1,000 mg Oral TID  . apixaban  2.5 mg Oral BID  . atorvastatin  80 mg Oral q1800  . cycloSPORINE  1 drop Both Eyes Q12H  . diclofenac sodium  2 g Topical BID  . diltiazem  180 mg Oral Daily  . ferrous sulfate  325 mg Oral Q breakfast  . furosemide  40 mg Oral Daily  . gabapentin  100 mg Oral TID  . pantoprazole  40 mg Oral Daily  . polyvinyl alcohol  1 drop Both Eyes TID  . potassium chloride  20 mEq Oral Daily  . traMADol  50 mg Oral BID  . umeclidinium-vilanterol  1 puff Inhalation Daily   Continuous Infusions: . sodium chloride 10 mL/hr at 07/09/17 0009   PRN Meds: acetaminophen **OR** acetaminophen, albuterol, hydrocortisone, ondansetron **OR** ondansetron (ZOFRAN) IV, promethazine  Allergies:    Allergies  Allergen Reactions  . Sulfur Swelling    Rash also per patient    Social History:   Social History   Social History  . Marital status: Widowed    Spouse name: N/A  . Number of children: N/A  . Years of education: N/A   Occupational  History  . Not on file.   Social History Main Topics  . Smoking status: Never Smoker  . Smokeless tobacco: Never Used  . Alcohol use No  . Drug use: No  . Sexual activity: No   Other Topics Concern  . Not on file   Social History Narrative  . No narrative on file    Family History:   The patient's family history includes Emphysema in her daughter; Glaucoma in her daughter; Healthy in her daughter, son, and son; Heart disease in her father and maternal grandmother; Other in her daughter; Pneumonia in her father; Stroke in her mother.  ROS:  Please see the history of present illness.  ROS  All other ROS reviewed and  negative.     Physical Exam/Data:   Vitals:   07/08/17 2235 07/08/17 2300 07/08/17 2330 07/09/17 0010  BP: 102/82 134/87 (!) 139/52 (!) 123/95  Pulse: 73 82 (!) 52 66  Resp: 20 (!) 26 20 18   Temp:    98.2 F (36.8 C)  TempSrc:    Oral  SpO2: 98% 93% 99% 96%  Weight:    140 lb 6.9 oz (63.7 kg)  Height:        Intake/Output Summary (Last 24 hours) at 07/09/17 0854 Last data filed at 07/09/17 0552  Gross per 24 hour  Intake            57.17 ml  Output             1400 ml  Net         -1342.83 ml   Filed Weights   07/08/17 1509 07/09/17 0010  Weight: 141 lb (64 kg) 140 lb 6.9 oz (63.7 kg)   Body mass index is 24.11 kg/m.  General:  Well nourished, well developed, in no acute distress.Frail  HEENT: normal Lymph: no adenopathy Neck: no JVD Endocrine:  No thryomegaly Vascular: No carotid bruits; FA pulses 2+ bilaterally without bruits  Cardiac:  normal S1, S2; IRRR; no murmur Lungs:  Some crackles but no wheezes or rhonchi. Diminished in the bases. No coughing.  Abd: soft, nontender, no hepatomegaly  Ext: no edema Musculoskeletal:  No deformities, BUE and BLE strength normal and equal Skin: warm and dry Pale.  Neuro:  CNs 2-12 intact, no focal abnormalities noted Psych:  Normal affect   EKG:  The EKG was personally reviewed and demonstrates:  Course atrial fib, rate of 59 bpm.  Telemetry:  Telemetry was personally reviewed and demonstrates: Atrial fib with frequent PVC;s some in pairs.   Relevant CV Studies: Echocardiogram 10/16/2015 Left ventricle: The cavity size was normal. Systolic function was   normal. The estimated ejection fraction was in the range of 60%   to 65%. Mild focal basal septal hypertrophy. The study was not   technically sufficient to allow evaluation of LV diastolic   dysfunction due to atrial fibrillation. - Aortic valve: Mildly to moderately calcified annulus. Trileaflet;   mildly thickened leaflets. - Mitral valve: Moderately calcified  annulus. Mildly thickened   leaflets . There was mild regurgitation. - Left atrium: The atrium was severely dilated. - Right ventricle: The cavity size was moderately dilated. Systolic   function was mildly reduced. - Right atrium: The atrium was severely dilated. - Atrial septum: There was increased thickness of the septum,   consistent with lipomatous hypertrophy. - Tricuspid valve: Mildly thickened leaflets. There was moderate   regurgitation. - Pulmonary arteries: Systolic pressure was moderately to severely   increased. PA peak  pressure: 58 mm Hg (S). - Inferior vena cava: The vessel was dilated. The respirophasic   diameter changes were blunted (< 50%), consistent with elevated   central venous pressure.   Laboratory Data:  Chemistry Recent Labs Lab 07/05/17 0702 07/08/17 1641 07/09/17 0705  NA 140 138 142  K 4.2 4.7 3.8  CL 105 104 106  CO2 28 23 27   GLUCOSE 93 94 96  BUN 28* 29* 24*  CREATININE 1.01* 1.01* 0.80  CALCIUM 8.5* 8.9 8.8*  GFRNONAA 45* 45* >60  GFRAA 53* 53* >60  ANIONGAP 7 11 9      Recent Labs Lab 07/08/17 1641 07/09/17 0705  PROT 6.8 6.1*  ALBUMIN 3.6 3.2*  AST 33 25  ALT 25 22  ALKPHOS 134* 119  BILITOT 0.8 0.7   Hematology Recent Labs Lab 07/08/17 0500 07/08/17 1641 07/09/17 0705  WBC 13.1* 8.9 7.6  RBC 3.61* 3.69* 3.36*  HGB 10.2* 10.2* 9.3*  HCT 31.9* 32.1* 29.0*  MCV 88.4 87.0 86.3  MCH 28.3 27.6 27.7  MCHC 32.0 31.8 32.1  RDW 13.8 13.6 13.6  PLT 274 273 248   Cardiac Enzymes Recent Labs Lab 07/08/17 1641  TROPONINI <0.03   No results for input(s): TROPIPOC in the last 168 hours.  BNP Recent Labs Lab 07/08/17 1641  BNP 360.0*    DDimer No results for input(s): DDIMER in the last 168 hours.  Radiology/Studies:  Dg Chest 2 View  Result Date: 07/08/2017 CLINICAL DATA:  Abdominal pain EXAM: CHEST  2 VIEW COMPARISON:  06/24/2017 FINDINGS: Stable cardiac enlargement. There is evidence of associated mild  interstitial edema/CHF. Probable tiny amount of bilateral posterior pleural fluid without significant pleural effusions. No focal airspace consolidation or pneumothorax. IMPRESSION: Cardiomegaly with associated mild interstitial edema/ CHF. Electronically Signed   By: Aletta Edouard M.D.   On: 07/08/2017 19:04   Ct Abdomen Pelvis W Contrast  Result Date: 07/08/2017 CLINICAL DATA:  Left lower abdominal pain x3 days, dysuria, leukocytosis EXAM: CT ABDOMEN AND PELVIS WITH CONTRAST TECHNIQUE: Multidetector CT imaging of the abdomen and pelvis was performed using the standard protocol following bolus administration of intravenous contrast. CONTRAST:  68mL ISOVUE-300 IOPAMIDOL (ISOVUE-300) INJECTION 61% COMPARISON:  None. FINDINGS: Lower chest: Small bilateral pleural effusions. Mild bibasilar atelectasis. Hepatobiliary: Mildly nodular hepatic contour. Calcified hepatic granulomata. Status post cholecystectomy. No intrahepatic or extrahepatic duct dilatation. Common duct measures 10 mm and smoothly tapers at the ampulla, likely postsurgical. Pancreas: Within normal limits.  No pancreatic ductal dilatation. Spleen: Normal in size. Adrenals/Urinary Tract: Adrenal glands within normal limits. Kidneys are within normal limits.  No hydronephrosis. Bladder is within normal limits. Stomach/Bowel: Stomach is within normal limits. Moderate duodenal diverticulum (series 2/ image 36). No evidence of bowel obstruction. Appendix not discretely visualized. Extensive left colonic diverticulosis, without evidence of diverticulitis. Vascular/Lymphatic: No evidence of abdominal aortic aneurysm. Atherosclerotic calcifications of the abdominal aorta and branch vessels. No suspicious abdominopelvic lymphadenopathy. Reproductive: Status post hysterectomy. No adnexal masses. Other: Small volume abdominopelvic ascites, centered in the porta hepatis, along the right pericolic gutter, and in the right pelvis. Musculoskeletal: Mild  degenerative changes of the visualized thoracolumbar spine. IMPRESSION: Extensive colonic diverticulosis, without evidence of diverticulitis. No evidence of bowel obstruction. Small volume right abdominopelvic ascites, as above. Mildly nodular hepatic contour, raising the possibility of cirrhosis. Small bilateral pleural effusions. Additional ancillary findings as above. Electronically Signed   By: Julian Hy M.D.   On: 07/08/2017 18:59    Assessment and Plan:   1.  Acute on Chronic CHF:  She is responding to IV lasix. Now on po. CXR demonstrates CHF.  Will repeat echo, as last one was completed in 2016 and demonstrated normal LV fx. She may have had a change in EF with CHF noted in lungs.  Abdominal pain may be related to edema as well. She was negative for diverticulitis, but had diverticulosis. She did have some ascites. Creatinine 0.80. LFTs WNL.   2. Atrial fib: Heart rate is controlled on diltiazem. Remains on Eliquis 2.5 mg BID,  Denies bleeding, but noted anemia on admission. Hgb 9.3 this am.   3. Abdominal Pain: Resolved. With anemia, would hemoccult stool.   4. COPD: Breathing status has improved currently at rest.    Signed, Jory Sims, NP  07/09/2017 8:54 AM   Attending note Patient seen and discussed with DNP Purcell Nails, I agree with her documentation. History of afib on anticoagulation, HTN, DNR status, admitted with abdominal pain. She has also had recent LE edema and weight gain.   WBC 13.1, Hgb 10.2, Plt 274, K 4.7, Cr 1.01, lactic acid 2.1, BNP 360,  Trop neg x 1 CXR cardiomegaly, mild edema CT abdomen/pelvis: Extensive colonic diverticulosis, without evidence of diverticulitis. No evidence of bowel obstruction. EKG afib rate 60 10/2015 echo: LVEF 60-65%, cannot eval diastolic function, mild MR, mild RV dysfunction, severe LAE  Patient admitted with abdominal pain, workup per primary team. From cardiac standpoint evidence of volume overload on admission. She is  negative 1.3 liters since admission, she received lasix 40mg  IV x 1 yesterday and lasix 40mg  po this AM. Downtrend in Cr and BUN with diuresis consistent with venous congestion and CHF. Repeat echo is pending. Still with some volume overload, increase oral lasix to 60mg  daily. From notes she was increased to 40mg  daily on 7/11, does not appear to have controlled her volume   Zandra Abts MD

## 2017-07-10 ENCOUNTER — Encounter: Payer: Self-pay | Admitting: Internal Medicine

## 2017-07-10 ENCOUNTER — Encounter (HOSPITAL_COMMUNITY)
Admission: RE | Admit: 2017-07-10 | Discharge: 2017-07-10 | Disposition: A | Discharge status: Home / Self Care | Payer: Medicare Other | Source: Skilled Nursing Facility | Attending: Internal Medicine | Admitting: Internal Medicine

## 2017-07-10 ENCOUNTER — Non-Acute Institutional Stay (SKILLED_NURSING_FACILITY): Payer: Medicare Other | Admitting: Internal Medicine

## 2017-07-10 DIAGNOSIS — I63312 Cerebral infarction due to thrombosis of left middle cerebral artery: Secondary | ICD-10-CM | POA: Insufficient documentation

## 2017-07-10 DIAGNOSIS — M1990 Unspecified osteoarthritis, unspecified site: Secondary | ICD-10-CM | POA: Diagnosis not present

## 2017-07-10 DIAGNOSIS — I5032 Chronic diastolic (congestive) heart failure: Secondary | ICD-10-CM | POA: Diagnosis not present

## 2017-07-10 DIAGNOSIS — K625 Hemorrhage of anus and rectum: Secondary | ICD-10-CM | POA: Insufficient documentation

## 2017-07-10 DIAGNOSIS — M5414 Radiculopathy, thoracic region: Secondary | ICD-10-CM

## 2017-07-10 DIAGNOSIS — Z5189 Encounter for other specified aftercare: Secondary | ICD-10-CM | POA: Diagnosis not present

## 2017-07-10 DIAGNOSIS — I4891 Unspecified atrial fibrillation: Secondary | ICD-10-CM

## 2017-07-10 LAB — BASIC METABOLIC PANEL
Anion gap: 9 (ref 5–15)
BUN: 25 mg/dL — AB (ref 6–20)
CHLORIDE: 102 mmol/L (ref 101–111)
CO2: 25 mmol/L (ref 22–32)
Calcium: 8.5 mg/dL — ABNORMAL LOW (ref 8.9–10.3)
Creatinine, Ser: 0.92 mg/dL (ref 0.44–1.00)
GFR calc Af Amer: 59 mL/min — ABNORMAL LOW (ref >60)
GFR calc non Af Amer: 51 mL/min — ABNORMAL LOW (ref >60)
Glucose, Bld: 92 mg/dL (ref 65–99)
POTASSIUM: 3.9 mmol/L (ref 3.5–5.1)
Sodium: 136 mmol/L (ref 135–145)

## 2017-07-10 LAB — URINE CULTURE

## 2017-07-10 LAB — CBC
HEMATOCRIT: 30.5 % — AB (ref 36.0–46.0)
HEMOGLOBIN: 9.8 g/dL — AB (ref 12.0–15.0)
MCH: 27.7 pg (ref 26.0–34.0)
MCHC: 32.1 g/dL (ref 30.0–36.0)
MCV: 86.2 fL (ref 78.0–100.0)
Platelets: 240 10*3/uL (ref 150–400)
RBC: 3.54 MIL/uL — ABNORMAL LOW (ref 3.87–5.11)
RDW: 13.8 % (ref 11.5–15.5)
WBC: 5.8 10*3/uL (ref 4.0–10.5)

## 2017-07-10 NOTE — Assessment & Plan Note (Signed)
Lasix has been increased to 60 mg daily, follow-up with her cardiologist 07/25/17

## 2017-07-10 NOTE — Progress Notes (Signed)
Provider: Unice Cobble MD  Location:   [place] Room Number: 156/W Place of Service:  SNF [phone])  PCP: Virgie Dad, MD Patient Care Team: Virgie Dad, MD as PCP - General (Internal Medicine) Rolm Baptise as Physician Assistant (Internal Medicine)  Extended Emergency Contact Information Primary Emergency Contact: Adin Hector, [place] [address] [name] of [place] Phone: [phone] Relation: Son Secondary Emergency Contact: [name], [place] [address] [name] of [place] Phone: [phone] Mobile Phone: [phone] Relation: Son  Code Status: DNR Goals of Care: Advanced Directive information Advanced Directives 07/10/2017  Does Patient Have a Medical Advance Directive? Yes  Type of Advance Directive Out of facility DNR (pink MOST or yellow form)  Does patient want to make changes to medical advance directive? No - Patient declined  Would patient like information on creating a medical advance directive? No - Patient declined  Pre-existing out of facility DNR order (yellow form or pink MOST form) -   Chief Complaint  Patient presents with  . Readmit To SNF   HPI: Patient is a 83 y.o. female seen today for readmission to [facility] Following hospitalization 7/30-7/31/18 for abdominal pain and mild volume overload. The patient had experienced abdominal pain for several days, in the ED CT scan revealed extensive chronic diverticulosis without diverticulitis as well as small bilateral pleural effusions. The patient's abdominal pain actually had resolved by arrival time in ED. She received IV Lasix for bilateral lower extremity edema. Her dose of Lasix had been increased as of 7/11 due to weight gain. BNP was not significantly elevated at 360, troponin was less than 0.03. This was in the context of atrial fibrillation for which she is on Eliquis and chronic diastolic congestive heart failure. She also  has medical diagnoses of essential hypertension and COPD. The patient is W/C or bedbound due to right knee pain.  With the IV Lasix she diuresed 1.3 L without complication. She denied any cardiac or pulmonary symptoms. As stated the abdominal pain had resolved and she was able to eat. Dr. Harl Bowie increased her Lasix to 60 mg daily with follow-up with cardiology 8/16.   she has chronic normocytic, normochromic anemia which is essentially stable. The most recent CBC reveal hemoglobin 9.8/hematocrit 30.5.  Past Medical History:  Diagnosis Date  . Atrial fibrillation ([place])   . Breast nodule 11/15/2014  . Breast pain, left 11/15/2014  . Carotid artery disease ([place])   . Dizziness   . Dyspnea    Previous CPX suggesting possible restrictive physiology, respiratory muscle fatigue, diastolic dysfunction  . Essential hypertension, benign   . Hyperlipidemia   . Oxygen dependent    2 liter  . Rib pain on left side 11/15/2014  . Seasonal allergies   . Shingles 05/04/2015  . Toe fracture, left    left big toe   Past Surgical History:  Procedure Laterality Date  . COLONOSCOPY  02/22/2012   Procedure: COLONOSCOPY;  Surgeon: Rogene Houston, MD;  [facility];  Service: Endoscopy;  Laterality: N/A;  100  . KNEE SURGERY     bilateral    reports that she has never smoked. She has never used smokeless tobacco. She reports that she does not drink alcohol or use drugs. Social History   Social History  . Marital status: Widowed    Spouse name: N/A  . Number of  children: N/A  . Years of education: N/A   Occupational History  . Not on file.   Social History Main Topics  . Smoking status: Never Smoker  . Smokeless tobacco: Never Used  . Alcohol use No  . Drug use: No  . Sexual activity: No   Other Topics Concern  . Not on file   Social History Narrative  . No narrative on file   Functional Status Survey:Non ambulatory due to right knee pain; requires help for activities of daily  living    Family History  Problem Relation Age of Onset  . Stroke Mother   . Pneumonia Father   . Heart disease Father   . Healthy Son   . Glaucoma Daughter   . Emphysema Daughter   . Healthy Daughter   . Other Daughter        had knee replacement  . Healthy Son   . Heart disease Maternal Grandmother     Health Maintenance  Topic Date Due  . INFLUENZA VACCINE  11/09/2017 (Originally 07/10/2017)  . DEXA SCAN  11/09/2017 (Originally 01/15/1986)  . TETANUS/TDAP  11/09/2017 (Originally 01/16/1938)  . PNA vac Low Risk Adult (1 of 2 - PCV13) 11/09/2017 (Originally 01/15/1986)    Allergies  Allergen Reactions  . Sulfur Swelling    Rash also per patient    Outpatient Encounter Prescriptions as of 07/10/2017  Medication Sig  . acetaminophen (TYLENOL) 500 MG tablet Take 1,000 mg by mouth 3 (three) times daily.  Jearl Klinefelter ELLIPTA 62.5-25 MCG/INH AEPB Inhale 1 Inhaler into the lungs daily.   Marland Kitchen apixaban (ELIQUIS) 2.5 MG TABS tablet Take 1 tablet (2.5 mg total) by mouth 2 (two) times daily.  Marland Kitchen atorvastatin (LIPITOR) 80 MG tablet Take 1 tablet (80 mg total) by mouth daily at 6 PM.  . cycloSPORINE (RESTASIS) 0.05 % ophthalmic emulsion Place 1 drop into both eyes every 12 (twelve) hours.   Marland Kitchen diltiazem (CARDIZEM CD) 180 MG 24 hr capsule TAKE ONE CAPSULE BY MOUTH ONCE DAILY.  . ferrous sulfate (KP FERROUS SULFATE) 325 (65 FE) MG tablet Take 325 mg by mouth daily with breakfast.  . furosemide (LASIX) 20 MG tablet Take 3 tablets (60 mg total) by mouth daily.  Marland Kitchen gabapentin (NEURONTIN) 100 MG capsule Take 1 capsule (100 mg total) by mouth 3 (three) times daily.  . hydrocortisone (ANUSOL-HC) 2.5 % rectal cream Place 1 application rectally 2 (two) times daily as needed for hemorrhoids or anal itching.  . Multiple Vitamin (MULITIVITAMIN WITH MINERALS) TABS Take 1 tablet by mouth every other day.   Marland Kitchen omeprazole (PRILOSEC) 20 MG capsule Take 20 mg by mouth daily.    . OXYGEN Inhale 2 L into the lungs daily.  Oxygen @@ 2 L/mn via qhs. Twice a day   . Polyethyl Glycol-Propyl Glycol (SYSTANE OP) Place 1 drop into both eyes 3 (three) times daily. For dry eyes  . polyethylene glycol (MIRALAX / GLYCOLAX) packet Take 17 g by mouth daily.  . potassium chloride (K-DUR,KLOR-CON) 10 MEQ tablet Take 20 mEq by mouth daily.  Marland Kitchen PROAIR HFA 108 (90 BASE) MCG/ACT inhaler 2 puffs 3 (three) times daily as needed for wheezing (cough).   . promethazine (PHENERGAN) 12.5 MG tablet Take 12.5 mg by mouth every 6 (six) hours as needed for nausea or vomiting.  . traMADol (ULTRAM) 50 MG tablet Take 50 mg by mouth 2 (two) times daily.   . [DISCONTINUED] aspirin EC 81 MG tablet Take 81 mg by mouth daily.  . [  DISCONTINUED] carbamide peroxide (DEBROX) 6.5 % OTIC solution Place 10 drops into the left ear 2 (two) times daily.   No facility-administered encounter medications on file as of 07/10/2017.    Review of Systems : She states that the abdominal pain has resolved. Her edema she feels is essentially unchanged. She describes weakness since her hospitalization. She also describes occasional swimmyheadedness. She has intermittent blurred vision. Eyes are dry, she denies any extrinsic symptoms. She has intermittent pain which begins in the left back and comes around the left lateral abdomen. This occurs almost daily and can last minutes to an hour. There is no associated urinary or stool incontinence. This occurs mainly during the day. There is no radicular pain into the lower extremities.  Constitutional: No fever,significant weight change  Eyes: No redness, discharge, pain ENT/mouth: No nasal congestion,  purulent discharge, earache,change in hearing ,sore throat  Cardiovascular: No chest pain, palpitations,paroxysmal nocturnal dyspnea, claudication  Respiratory: No cough, sputum production,hemoptysis, DOE , significant snoring,apnea  Gastrointestinal: No heartburn,dysphagia,abdominal pain, nausea / vomiting,rectal bleeding,  melena,change in bowels Genitourinary: No dysuria,hematuria, pyuria,  incontinence, nocturia Musculoskeletal: No joint swelling Dermatologic: No rash, pruritus, change in appearance of skin Neurologic: No headache,syncope, seizures, numbness , tingling Psychiatric: No significant anxiety , depression, insomnia, anorexia Endocrine: No change in hair/skin/ nails, excessive thirst, excessive hunger, excessive urination  Hematologic/lymphatic: No significant bruising, lymphadenopathy,abnormal bleeding Allergy/immunology: No itchy/ watery eyes, significant sneezing, urticaria, angioedema  There were no vitals filed for this visit. There is no height or weight on file to calculate BMI. Physical Exam   Pertinent or positive findings: she has hearing aids but is still hard of hearing.  The lacrimal glands are prominent. The lower lids are baggy. She has bilateral ptosis. Heart rhythm is slightly irregular. The second heart sound is increased. She has 1/2+ pedal edema. Pedal pulses are decreased. There is dramatic fusiform change of the right knee which is markedly larger than the left. No effusion is palpable. She has mixed DIP/PIP joint changes with isolated lateral deviations of fingers.   General appearance:Adequately nourished; no acute distress , increased work of breathing is present.   Lymphatic: No lymphadenopathy about the head, neck, axilla . Eyes: No conjunctival inflammation or lid edema is present. There is no scleral icterus. Ears:  External ear exam shows no significant lesions or deformities.   Nose:  External nasal examination shows no deformity or inflammation. Nasal mucosa are pink and moist without lesions ,exudates Oral exam: lips and gums are healthy appearing.There is no oropharyngeal erythema or exudate . Neck:  No thyromegaly, masses, tenderness noted.    Heart:  No gallop, murmur, click, rub .  Lungs:Chest clear to auscultation without wheezes, rhonchi,rales ,  rubs. Abdomen:Bowel sounds are normal. Abdomen is soft and nontender with no organomegaly, hernias,masses. GU: deferred  Extremities:  No cyanosis, clubbing Neurologic exam : Balance,Rhomberg,finger to nose testing could not be completed due to clinical state Skin: Warm & dry w/o tenting. No significant lesions or rash.  Labs reviewed: Basic Metabolic Panel:  Recent Labs  07/08/17 1641 07/09/17 0705 07/10/17 0700  NA 138 142 136  K 4.7 3.8 3.9  CL 104 106 102  CO2 23 27 25   GLUCOSE 94 96 92  BUN 29* 24* 25*  CREATININE 1.01* 0.80 0.92  CALCIUM 8.9 8.8* 8.5*   Liver Function Tests:  Recent Labs  06/04/17 0717 07/08/17 1641 07/09/17 0705  AST 19 33 25  ALT 13* 25 22  ALKPHOS 105 134* 119  BILITOT 0.8 0.8 0.7  PROT 6.0* 6.8 6.1*  ALBUMIN 3.3* 3.6 3.2*    Recent Labs  07/08/17 1641  LIPASE 26   No results for input(s): AMMONIA in the last 8760 hours. CBC:  Recent Labs  07/05/17 0702 07/08/17 0500 07/08/17 1641 07/09/17 0705 07/10/17 0700  WBC 8.5 13.1* 8.9 7.6 5.8  NEUTROABS 5.6 8.8* 6.2  --   --   HGB 8.8* 10.2* 10.2* 9.3* 9.8*  HCT 27.4* 31.9* 32.1* 29.0* 30.5*  MCV 87.3 88.4 87.0 86.3 86.2  PLT 234 274 273 248 240   Cardiac Enzymes:  Recent Labs  07/08/17 1641  TROPONINI <0.03   BNP: Invalid input(s): POCBNP Lab Results  Component Value Date   HGBA1C 5.5 12/06/2016   Lab Results  Component Value Date   TSH 1.100 06/04/2017   No results found for: VITAMINB12 No results found for: FOLATE No results found for: IRON, TIBC, FERRITIN  Imaging and Procedures obtained prior to SNF admission: No results found.  Assessment/Plan: See summary under each active problem in the Problem List with associated updated therapeutic plan . Family/ staff Communication:   Labs/tests ordered:

## 2017-07-10 NOTE — Patient Instructions (Signed)
See assessment and plan under each diagnosis in the problem list and acutely for this visit 

## 2017-07-10 NOTE — Assessment & Plan Note (Signed)
Rate is controlled, no change in present regimen

## 2017-07-10 NOTE — Assessment & Plan Note (Addendum)
Increase dose of gabapentin to 200 mg every 12 hours

## 2017-07-11 ENCOUNTER — Encounter (HOSPITAL_COMMUNITY)
Admission: RE | Admit: 2017-07-11 | Discharge: 2017-07-11 | Disposition: A | Discharge status: Home / Self Care | Payer: Medicare Other | Source: Skilled Nursing Facility | Attending: Internal Medicine | Admitting: Internal Medicine

## 2017-07-11 DIAGNOSIS — I63312 Cerebral infarction due to thrombosis of left middle cerebral artery: Secondary | ICD-10-CM | POA: Diagnosis not present

## 2017-07-11 DIAGNOSIS — I5032 Chronic diastolic (congestive) heart failure: Secondary | ICD-10-CM | POA: Insufficient documentation

## 2017-07-11 DIAGNOSIS — K625 Hemorrhage of anus and rectum: Secondary | ICD-10-CM | POA: Insufficient documentation

## 2017-07-11 DIAGNOSIS — M1990 Unspecified osteoarthritis, unspecified site: Secondary | ICD-10-CM | POA: Insufficient documentation

## 2017-07-11 DIAGNOSIS — Z5189 Encounter for other specified aftercare: Secondary | ICD-10-CM | POA: Insufficient documentation

## 2017-07-11 LAB — BASIC METABOLIC PANEL
Anion gap: 15 (ref 5–15)
BUN: 31 mg/dL — AB (ref 6–20)
CHLORIDE: 101 mmol/L (ref 101–111)
CO2: 21 mmol/L — AB (ref 22–32)
CREATININE: 1.16 mg/dL — AB (ref 0.44–1.00)
Calcium: 8.8 mg/dL — ABNORMAL LOW (ref 8.9–10.3)
GFR calc Af Amer: 45 mL/min — ABNORMAL LOW (ref >60)
GFR calc non Af Amer: 38 mL/min — ABNORMAL LOW (ref >60)
Glucose, Bld: 115 mg/dL — ABNORMAL HIGH (ref 65–99)
POTASSIUM: 4.1 mmol/L (ref 3.5–5.1)
Sodium: 137 mmol/L (ref 135–145)

## 2017-07-11 LAB — CBC WITH DIFFERENTIAL/PLATELET
BASOS ABS: 0 10*3/uL (ref 0.0–0.1)
Basophils Relative: 0 %
EOS PCT: 2 %
Eosinophils Absolute: 0.2 10*3/uL (ref 0.0–0.7)
HCT: 33.3 % — ABNORMAL LOW (ref 36.0–46.0)
Hemoglobin: 10.2 g/dL — ABNORMAL LOW (ref 12.0–15.0)
LYMPHS PCT: 25 %
Lymphs Abs: 2.7 10*3/uL (ref 0.7–4.0)
MCH: 27.1 pg (ref 26.0–34.0)
MCHC: 30.6 g/dL (ref 30.0–36.0)
MCV: 88.6 fL (ref 78.0–100.0)
MONO ABS: 1.2 10*3/uL — AB (ref 0.1–1.0)
MONOS PCT: 12 %
Neutro Abs: 6.4 10*3/uL (ref 1.7–7.7)
Neutrophils Relative %: 61 %
PLATELETS: 280 10*3/uL (ref 150–400)
RBC: 3.76 MIL/uL — ABNORMAL LOW (ref 3.87–5.11)
RDW: 14.1 % (ref 11.5–15.5)
WBC: 10.5 10*3/uL (ref 4.0–10.5)

## 2017-07-11 LAB — URINALYSIS, ROUTINE W REFLEX MICROSCOPIC
BILIRUBIN URINE: NEGATIVE
Glucose, UA: NEGATIVE mg/dL
Hgb urine dipstick: NEGATIVE
Ketones, ur: NEGATIVE mg/dL
Leukocytes, UA: NEGATIVE
NITRITE: NEGATIVE
PH: 5 (ref 5.0–8.0)
Protein, ur: NEGATIVE mg/dL
SPECIFIC GRAVITY, URINE: 1.019 (ref 1.005–1.030)

## 2017-07-12 ENCOUNTER — Encounter: Payer: Self-pay | Admitting: Internal Medicine

## 2017-07-13 LAB — URINE CULTURE: Culture: NO GROWTH

## 2017-07-14 ENCOUNTER — Encounter (HOSPITAL_COMMUNITY): Payer: Self-pay

## 2017-07-14 ENCOUNTER — Inpatient Hospital Stay: Admit: 2017-07-14 | Payer: Self-pay | Admitting: Interventional Cardiology

## 2017-07-14 ENCOUNTER — Inpatient Hospital Stay (HOSPITAL_COMMUNITY)
Admission: EM | Admit: 2017-07-14 | Discharge: 2017-07-19 | DRG: 242 | Disposition: A | Discharge status: Skilled Nursing Facility | Payer: Medicare Other | Attending: Internal Medicine | Admitting: Internal Medicine

## 2017-07-14 ENCOUNTER — Emergency Department (HOSPITAL_COMMUNITY): Payer: Medicare Other

## 2017-07-14 DIAGNOSIS — Z7901 Long term (current) use of anticoagulants: Secondary | ICD-10-CM | POA: Diagnosis not present

## 2017-07-14 DIAGNOSIS — I493 Ventricular premature depolarization: Secondary | ICD-10-CM

## 2017-07-14 DIAGNOSIS — N179 Acute kidney failure, unspecified: Secondary | ICD-10-CM

## 2017-07-14 DIAGNOSIS — M542 Cervicalgia: Secondary | ICD-10-CM | POA: Diagnosis not present

## 2017-07-14 DIAGNOSIS — E785 Hyperlipidemia, unspecified: Secondary | ICD-10-CM | POA: Diagnosis present

## 2017-07-14 DIAGNOSIS — Z823 Family history of stroke: Secondary | ICD-10-CM | POA: Diagnosis not present

## 2017-07-14 DIAGNOSIS — D649 Anemia, unspecified: Secondary | ICD-10-CM | POA: Diagnosis not present

## 2017-07-14 DIAGNOSIS — T502X5A Adverse effect of carbonic-anhydrase inhibitors, benzothiadiazides and other diuretics, initial encounter: Secondary | ICD-10-CM | POA: Diagnosis not present

## 2017-07-14 DIAGNOSIS — G8929 Other chronic pain: Secondary | ICD-10-CM | POA: Diagnosis present

## 2017-07-14 DIAGNOSIS — I495 Sick sinus syndrome: Secondary | ICD-10-CM | POA: Diagnosis present

## 2017-07-14 DIAGNOSIS — Z882 Allergy status to sulfonamides status: Secondary | ICD-10-CM

## 2017-07-14 DIAGNOSIS — I442 Atrioventricular block, complete: Principal | ICD-10-CM | POA: Diagnosis present

## 2017-07-14 DIAGNOSIS — Z959 Presence of cardiac and vascular implant and graft, unspecified: Secondary | ICD-10-CM

## 2017-07-14 DIAGNOSIS — N183 Chronic kidney disease, stage 3 (moderate): Secondary | ICD-10-CM | POA: Diagnosis not present

## 2017-07-14 DIAGNOSIS — Z7401 Bed confinement status: Secondary | ICD-10-CM | POA: Diagnosis not present

## 2017-07-14 DIAGNOSIS — Z993 Dependence on wheelchair: Secondary | ICD-10-CM | POA: Diagnosis not present

## 2017-07-14 DIAGNOSIS — I5033 Acute on chronic diastolic (congestive) heart failure: Secondary | ICD-10-CM | POA: Diagnosis not present

## 2017-07-14 DIAGNOSIS — I482 Chronic atrial fibrillation: Secondary | ICD-10-CM | POA: Diagnosis present

## 2017-07-14 DIAGNOSIS — Z66 Do not resuscitate: Secondary | ICD-10-CM | POA: Diagnosis not present

## 2017-07-14 DIAGNOSIS — I10 Essential (primary) hypertension: Secondary | ICD-10-CM

## 2017-07-14 DIAGNOSIS — R001 Bradycardia, unspecified: Secondary | ICD-10-CM | POA: Diagnosis not present

## 2017-07-14 DIAGNOSIS — I130 Hypertensive heart and chronic kidney disease with heart failure and stage 1 through stage 4 chronic kidney disease, or unspecified chronic kidney disease: Secondary | ICD-10-CM | POA: Diagnosis not present

## 2017-07-14 DIAGNOSIS — Z8249 Family history of ischemic heart disease and other diseases of the circulatory system: Secondary | ICD-10-CM | POA: Diagnosis not present

## 2017-07-14 DIAGNOSIS — I700 Atherosclerosis of aorta: Secondary | ICD-10-CM | POA: Diagnosis not present

## 2017-07-14 LAB — COMPREHENSIVE METABOLIC PANEL
ALT: 30 U/L (ref 14–54)
ANION GAP: 11 (ref 5–15)
AST: 36 U/L (ref 15–41)
Albumin: 3.2 g/dL — ABNORMAL LOW (ref 3.5–5.0)
Alkaline Phosphatase: 125 U/L (ref 38–126)
BUN: 54 mg/dL — ABNORMAL HIGH (ref 6–20)
CALCIUM: 8.7 mg/dL — AB (ref 8.9–10.3)
CHLORIDE: 103 mmol/L (ref 101–111)
CO2: 20 mmol/L — AB (ref 22–32)
CREATININE: 1.72 mg/dL — AB (ref 0.44–1.00)
GFR, EST AFRICAN AMERICAN: 28 mL/min — AB (ref >60)
GFR, EST NON AFRICAN AMERICAN: 24 mL/min — AB (ref >60)
Glucose, Bld: 126 mg/dL — ABNORMAL HIGH (ref 65–99)
Potassium: 5.2 mmol/L — ABNORMAL HIGH (ref 3.5–5.1)
SODIUM: 134 mmol/L — AB (ref 135–145)
Total Bilirubin: 0.6 mg/dL (ref 0.3–1.2)
Total Protein: 5.9 g/dL — ABNORMAL LOW (ref 6.5–8.1)

## 2017-07-14 LAB — I-STAT CHEM 8, ED
BUN: 49 mg/dL — ABNORMAL HIGH (ref 6–20)
CALCIUM ION: 1.06 mmol/L — AB (ref 1.15–1.40)
CREATININE: 1.9 mg/dL — AB (ref 0.44–1.00)
Chloride: 100 mmol/L — ABNORMAL LOW (ref 101–111)
GLUCOSE: 122 mg/dL — AB (ref 65–99)
HCT: 32 % — ABNORMAL LOW (ref 36.0–46.0)
HEMOGLOBIN: 10.9 g/dL — AB (ref 12.0–15.0)
POTASSIUM: 5.1 mmol/L (ref 3.5–5.1)
Sodium: 132 mmol/L — ABNORMAL LOW (ref 135–145)
TCO2: 18 mmol/L (ref 0–100)

## 2017-07-14 LAB — CBC WITH DIFFERENTIAL/PLATELET
BASOS PCT: 0 %
Basophils Absolute: 0 10*3/uL (ref 0.0–0.1)
EOS ABS: 0.1 10*3/uL (ref 0.0–0.7)
Eosinophils Relative: 0 %
HEMATOCRIT: 31.8 % — AB (ref 36.0–46.0)
HEMOGLOBIN: 10.1 g/dL — AB (ref 12.0–15.0)
LYMPHS ABS: 1.4 10*3/uL (ref 0.7–4.0)
Lymphocytes Relative: 12 %
MCH: 28.1 pg (ref 26.0–34.0)
MCHC: 31.8 g/dL (ref 30.0–36.0)
MCV: 88.3 fL (ref 78.0–100.0)
Monocytes Absolute: 1.3 10*3/uL — ABNORMAL HIGH (ref 0.1–1.0)
Monocytes Relative: 11 %
NEUTROS ABS: 8.6 10*3/uL — AB (ref 1.7–7.7)
NEUTROS PCT: 77 %
Platelets: 258 10*3/uL (ref 150–400)
RBC: 3.6 MIL/uL — AB (ref 3.87–5.11)
RDW: 14.9 % (ref 11.5–15.5)
WBC: 11.3 10*3/uL — AB (ref 4.0–10.5)

## 2017-07-14 LAB — TROPONIN I: TROPONIN I: 0.06 ng/mL — AB (ref <0.03)

## 2017-07-14 LAB — BRAIN NATRIURETIC PEPTIDE: B NATRIURETIC PEPTIDE 5: 542 pg/mL — AB (ref 0.0–100.0)

## 2017-07-14 LAB — MRSA PCR SCREENING: MRSA BY PCR: NEGATIVE

## 2017-07-14 MED ORDER — ACETAMINOPHEN 325 MG PO TABS: 650.0000 mg | ORAL_TABLET | Freq: Four times a day (QID) | ORAL | Status: DC | PRN

## 2017-07-14 MED ORDER — POLYVINYL ALCOHOL 1.4 % OP SOLN
Freq: Three times a day (TID) | OPHTHALMIC | Status: DC
  Administered 2017-07-15 (×2): via OPHTHALMIC
  Administered 2017-07-15: 1 [drp] via OPHTHALMIC
  Administered 2017-07-16: 10:00:00 via OPHTHALMIC
  Administered 2017-07-16: 1 [drp] via OPHTHALMIC
  Administered 2017-07-17: 10:00:00 via OPHTHALMIC
  Filled 2017-07-14 (×2): qty 15

## 2017-07-14 MED ORDER — PANTOPRAZOLE SODIUM 40 MG PO TBEC
40.0000 mg | DELAYED_RELEASE_TABLET | Freq: Every day | ORAL | Status: DC
  Administered 2017-07-15 – 2017-07-17 (×3): 40 mg via ORAL
  Filled 2017-07-14 (×3): qty 1

## 2017-07-14 MED ORDER — ATORVASTATIN CALCIUM 80 MG PO TABS
80.0000 mg | ORAL_TABLET | Freq: Every day | ORAL | Status: DC
  Administered 2017-07-15 – 2017-07-17 (×3): 80 mg via ORAL
  Filled 2017-07-14 (×3): qty 1

## 2017-07-14 MED ORDER — TRAMADOL HCL 50 MG PO TABS
50.0000 mg | ORAL_TABLET | Freq: Two times a day (BID) | ORAL | Status: DC | PRN
  Administered 2017-07-17: 50 mg via ORAL
  Filled 2017-07-14: qty 1

## 2017-07-14 MED ORDER — GABAPENTIN 100 MG PO CAPS: 100.0000 mg | ORAL_CAPSULE | Freq: Three times a day (TID) | ORAL | Status: DC

## 2017-07-14 MED ORDER — GABAPENTIN 100 MG PO CAPS
200.0000 mg | ORAL_CAPSULE | Freq: Two times a day (BID) | ORAL | Status: DC
  Administered 2017-07-15 – 2017-07-17 (×4): 200 mg via ORAL
  Filled 2017-07-14 (×5): qty 2

## 2017-07-14 MED ORDER — POLYETHYL GLYCOL-PROPYL GLYCOL 0.4-0.3 % OP GEL: Freq: Three times a day (TID) | OPHTHALMIC | Status: DC

## 2017-07-14 MED ORDER — POLYETHYLENE GLYCOL 3350 17 G PO PACK
17.0000 g | PACK | Freq: Every day | ORAL | Status: DC
  Administered 2017-07-16: 17 g via ORAL
  Filled 2017-07-14 (×2): qty 1

## 2017-07-14 MED ORDER — ADULT MULTIVITAMIN W/MINERALS CH
1.0000 | ORAL_TABLET | ORAL | Status: DC
  Administered 2017-07-16: 1 via ORAL
  Filled 2017-07-14 (×2): qty 1

## 2017-07-14 MED ORDER — FERROUS SULFATE 325 (65 FE) MG PO TABS
325.0000 mg | ORAL_TABLET | Freq: Every day | ORAL | Status: DC
  Administered 2017-07-15 – 2017-07-17 (×3): 325 mg via ORAL
  Filled 2017-07-14 (×3): qty 1

## 2017-07-14 MED ORDER — UMECLIDINIUM-VILANTEROL 62.5-25 MCG/INH IN AEPB: 1.0000 | INHALATION_SPRAY | Freq: Every day | RESPIRATORY_TRACT | Status: DC

## 2017-07-14 MED ORDER — FUROSEMIDE 10 MG/ML IJ SOLN
40.0000 mg | Freq: Once | INTRAMUSCULAR | Status: AC
  Administered 2017-07-14: 40 mg via INTRAVENOUS
  Filled 2017-07-14: qty 4

## 2017-07-14 MED ORDER — SODIUM CHLORIDE 0.9% FLUSH
3.0000 mL | Freq: Two times a day (BID) | INTRAVENOUS | Status: DC
  Administered 2017-07-15 – 2017-07-17 (×3): 3 mL via INTRAVENOUS

## 2017-07-14 MED ORDER — UMECLIDINIUM-VILANTEROL 62.5-25 MCG/INH IN AEPB
1.0000 | INHALATION_SPRAY | Freq: Every day | RESPIRATORY_TRACT | Status: DC
  Administered 2017-07-15 – 2017-07-17 (×3): 1 via RESPIRATORY_TRACT
  Filled 2017-07-14: qty 14

## 2017-07-14 MED ORDER — APIXABAN 2.5 MG PO TABS
2.5000 mg | ORAL_TABLET | Freq: Two times a day (BID) | ORAL | Status: DC
  Administered 2017-07-14 – 2017-07-16 (×4): 2.5 mg via ORAL
  Filled 2017-07-14 (×4): qty 1

## 2017-07-14 MED ORDER — CYCLOSPORINE 0.05 % OP EMUL
1.0000 [drp] | Freq: Two times a day (BID) | OPHTHALMIC | Status: DC
  Administered 2017-07-15 – 2017-07-17 (×5): 1 [drp] via OPHTHALMIC
  Filled 2017-07-14 (×7): qty 1

## 2017-07-14 MED ORDER — ALBUTEROL SULFATE (2.5 MG/3ML) 0.083% IN NEBU: 2.5000 mg | INHALATION_SOLUTION | Freq: Four times a day (QID) | RESPIRATORY_TRACT | Status: DC | PRN

## 2017-07-14 NOTE — ED Notes (Signed)
Report to Ulysees Barns

## 2017-07-14 NOTE — ED Notes (Signed)
CRITICAL VALUE ALERT  Critical Value: troponin 0.06  Date & Time Notied:  07/14/17  1618  Provider Notified: Wallie Char  Orders Received/Actions taken: none

## 2017-07-14 NOTE — H&P (Signed)
CARDIOLOGY HISTORY & PHYSICAL   Referring Physician: Dr. Fredia Sorrow, [facility] Primary Physician: Dr. Raelene Bott [facility] Primary Cardiologist: Dr. Domenic Polite (past)/Dr. Harl Bowie (recent IP consult) Reason for Admission: bradycardia   HPI: [name] is a 83 yo woman with acute on chronic diastolic heart failure, recent admission for volume overload, atrial fibrillation on anticoagulation, HTN, who was transferred from [place] for bradycardia. The patient is a poor historian; additional history collected from her two sons at bedside.  Patient was recently admitted and discharged on 07/09/17 for acute on chronic diastolic heart failure. She was discharged to the [facility]. Given her volume overload, her oral lasix was increased from 40 mg daily to 60 mg daily. She is predominantly bed/wheelchair bound and ambulates only short distances from bed to wheelchair. Today, when she stood up at the end of the bed felt lightheaded. Currently she states she feels well overall. Breathing slightly shorter than usual, feels somewhat distended in her abdomen. Notes chronic swelling in her feet. Sleeps elevated at baseline. No PND.  Her sons state that they were told by the SNF that her weight was up 8 lbs in 2 days. They endorse that she is predominantly bedbound but mentally sharp.    In the ER, vitals notable for HR in the 30s. BP hypertensive. Labs show K 5.2, BUN 54, Cr 1.72 (recently 1.16, usually ~1), LFTs normal. CBC notable for WBC 11/3 (was 10.5 3 days ago). BNP 542 (was 360). TnI 0.06 (has been as high as 0.15 in last year).   Review of Systems:     Cardiac Review of Systems: {Y] = yes [ ]  = no  Chest Pain [ N   ]  Resting SOB Jazmín.Cullens   ] Exertional SOB  [ Y ]  Orthopnea [ Y ]   Pedal Edema Jazmín.Cullens   ]    Palpitations [ N ] Syncope  Aqua.Slicker  ]   Presyncope [Y   ]  General Review of Systems: [Y] = yes [  ]=no Constitional: recent weight change [  ]; anorexia [  ]; fatigue [  ]; nausea [  ];  night sweats [  ]; fever [  ]; or chills [  ];                                                                     Eyes : blurred vision [  ]; diplopia [   ]; vision changes [  ];  Amaurosis fugax[  ]; Resp: cough [  ];  wheezing[  ];  hemoptysis[  ];  PND [  ];  GI:  gallstones[  ], vomiting[  ];  dysphagia[  ]; melena[  ];  hematochezia [  ]; heartburn[  ];   GU: kidney stones [  ]; hematuria[  ];   dysuria [  ];  nocturia[  ]; incontinence [  ];             Skin: rash, swelling[  ];, hair loss[  ];  peripheral edema[ Y ];  or itching[  ]; Musculosketetal: myalgias[  ];  joint swelling[  ];  joint erythema[  ];  joint pain[ Y ];  back pain[  ];  Heme/Lymph: bruising[  ];  bleeding[  ];  anemia[  ];  Neuro: TIA[  ];  headaches[  ];  stroke[  ];  vertigo[  ];  seizures[  ];   paresthesias[  ];  difficulty walking[ Y ];  Psych:depression[  ]; anxiety[  ];  Endocrine: diabetes[  ];  thyroid dysfunction[  ];  Other:  Past Medical History:  Diagnosis Date  . Atrial fibrillation ([place])   . Breast nodule 11/15/2014  . Breast pain, left 11/15/2014  . Carotid artery disease ([place])   . Dizziness   . Dyspnea    Previous CPX suggesting possible restrictive physiology, respiratory muscle fatigue, diastolic dysfunction  . Essential hypertension, benign   . Hyperlipidemia   . Oxygen dependent    2 liter  . Rib pain on left side 11/15/2014  . Seasonal allergies   . Shingles 05/04/2015  . Toe fracture, left    left big toe    Medications Prior to Admission  Medication Sig Dispense Refill  . acetaminophen (TYLENOL) 500 MG tablet Take 1,000 mg by mouth 3 (three) times daily.    Jearl Klinefelter ELLIPTA 62.5-25 MCG/INH AEPB Inhale 1 Inhaler into the lungs daily.     Marland Kitchen apixaban (ELIQUIS) 2.5 MG TABS tablet Take 1 tablet (2.5 mg total) by mouth 2 (two) times daily. 60 tablet   . atorvastatin (LIPITOR) 80 MG tablet Take 1 tablet (80 mg total) by mouth daily at 6 PM.    . cycloSPORINE (RESTASIS) 0.05 % ophthalmic  emulsion Place 1 drop into both eyes every 12 (twelve) hours.     Marland Kitchen diltiazem (CARDIZEM CD) 180 MG 24 hr capsule TAKE ONE CAPSULE BY MOUTH ONCE DAILY. 30 capsule 6  . ferrous sulfate (KP FERROUS SULFATE) 325 (65 FE) MG tablet Take 325 mg by mouth daily with breakfast.    . furosemide (LASIX) 20 MG tablet Take 3 tablets (60 mg total) by mouth daily. 30 tablet 1  . gabapentin (NEURONTIN) 100 MG capsule Take 1 capsule (100 mg total) by mouth 3 (three) times daily. 90 capsule 2  . hydrocortisone (ANUSOL-HC) 2.5 % rectal cream Place 1 application rectally 2 (two) times daily as needed for hemorrhoids or anal itching.    . Multiple Vitamin (MULITIVITAMIN WITH MINERALS) TABS Take 1 tablet by mouth every other day.     Marland Kitchen omeprazole (PRILOSEC) 20 MG capsule Take 20 mg by mouth daily.      . OXYGEN Inhale 2 L into the lungs daily. Oxygen @@ 2 L/mn via qhs. Twice a day     . Polyethyl Glycol-Propyl Glycol (SYSTANE OP) Place 1 drop into both eyes 3 (three) times daily. For dry eyes    . polyethylene glycol (MIRALAX / GLYCOLAX) packet Take 17 g by mouth daily.    . potassium chloride (K-DUR,KLOR-CON) 10 MEQ tablet Take 20 mEq by mouth daily.    Marland Kitchen PROAIR HFA 108 (90 BASE) MCG/ACT inhaler 2 puffs 3 (three) times daily as needed for wheezing (cough).     . promethazine (PHENERGAN) 12.5 MG tablet Take 12.5 mg by mouth every 6 (six) hours as needed for nausea or vomiting.    . traMADol (ULTRAM) 50 MG tablet Take 50 mg by mouth 2 (two) times daily.        Marland Kitchen apixaban  2.5 mg Oral BID  . [START ON 07/15/2017] atorvastatin  80 mg Oral q1800  . cycloSPORINE  1 drop Both Eyes Q12H  . [START ON 07/15/2017] ferrous sulfate  325  mg Oral Q breakfast  . furosemide  40 mg Intravenous Once  . [START ON 07/15/2017] gabapentin  200 mg Oral BID  . multivitamin with minerals  1 tablet Oral QODAY  . pantoprazole  40 mg Oral Daily  . polyethylene glycol  17 g Oral Daily  . polyvinyl alcohol   Both Eyes TID  . sodium chloride flush   3 mL Intravenous Q12H  . [START ON 07/15/2017] umeclidinium-vilanterol  1 puff Inhalation Daily    Infusions:   Allergies  Allergen Reactions  . Sulfur Swelling    Rash also per patient    Social History   Social History  . Marital status: Widowed    Spouse name: N/A  . Number of children: N/A  . Years of education: N/A   Occupational History  . Not on file.   Social History Main Topics  . Smoking status: Never Smoker  . Smokeless tobacco: Never Used  . Alcohol use No  . Drug use: No  . Sexual activity: No   Other Topics Concern  . Not on file   Social History Narrative  . No narrative on file    Family History  Problem Relation Age of Onset  . Stroke Mother   . Pneumonia Father   . Heart disease Father   . Healthy Son   . Glaucoma Daughter   . Emphysema Daughter   . Healthy Daughter   . Other Daughter        had knee replacement  . Healthy Son   . Heart disease Maternal Grandmother     PHYSICAL EXAM: Vitals:   07/14/17 2045 07/14/17 2100  BP: (!) 151/50   Pulse: (!) 39 (!) 42  Resp:  19  Temp: 98 F (36.7 C)     No intake or output data in the 24 hours ending 07/14/17 2200  General:  Frail, elderly woman in NAD HEENT: normal Neck: supple. JVD to earlobe at 60 degrees. Carotids 2+ bilat. No lymphadenopathy or thryomegaly appreciated. Cor: PMI nondisplaced. Regular rate & rhythm, bradycardic in the 30s. No rubs, gallops or murmurs. Lungs: coarse with bibasilar rales Abdomen: soft, nontender, mildly distended. No bruits or masses. Good bowel sounds. Extremities: no cyanosis, clubbing, rash. 1+ edema bilaterally Neuro: alert & oriented x 3, cranial nerves grossly intact.  ECG: atrial fibrillation with ventricular escape rhythm  Results for orders placed or performed during the hospital encounter of 07/14/17 (from the past 24 hour(s))  Comprehensive metabolic panel     Status: Abnormal   Collection Time: 07/14/17  5:19 PM  Result Value Ref  Range   Sodium 134 (L) 135 - 145 mmol/L   Potassium 5.2 (H) 3.5 - 5.1 mmol/L   Chloride 103 101 - 111 mmol/L   CO2 20 (L) 22 - 32 mmol/L   Glucose, Bld 126 (H) 65 - 99 mg/dL   BUN 54 (H) 6 - 20 mg/dL   Creatinine, Ser 1.72 (H) 0.44 - 1.00 mg/dL   Calcium 8.7 (L) 8.9 - 10.3 mg/dL   Total Protein 5.9 (L) 6.5 - 8.1 g/dL   Albumin 3.2 (L) 3.5 - 5.0 g/dL   AST 36 15 - 41 U/L   ALT 30 14 - 54 U/L   Alkaline Phosphatase 125 38 - 126 U/L   Total Bilirubin 0.6 0.3 - 1.2 mg/dL   GFR calc non Af Amer 24 (L) >60 mL/min   GFR calc Af Amer 28 (L) >60 mL/min   Anion gap 11 5 -  15  CBC with Differential     Status: Abnormal   Collection Time: 07/14/17  5:19 PM  Result Value Ref Range   WBC 11.3 (H) 4.0 - 10.5 K/uL   RBC 3.60 (L) 3.87 - 5.11 MIL/uL   Hemoglobin 10.1 (L) 12.0 - 15.0 g/dL   HCT 31.8 (L) 36.0 - 46.0 %   MCV 88.3 78.0 - 100.0 fL   MCH 28.1 26.0 - 34.0 pg   MCHC 31.8 30.0 - 36.0 g/dL   RDW 14.9 11.5 - 15.5 %   Platelets 258 150 - 400 K/uL   Neutrophils Relative % 77 %   Neutro Abs 8.6 (H) 1.7 - 7.7 K/uL   Lymphocytes Relative 12 %   Lymphs Abs 1.4 0.7 - 4.0 K/uL   Monocytes Relative 11 %   Monocytes Absolute 1.3 (H) 0.1 - 1.0 K/uL   Eosinophils Relative 0 %   Eosinophils Absolute 0.1 0.0 - 0.7 K/uL   Basophils Relative 0 %   Basophils Absolute 0.0 0.0 - 0.1 K/uL  Brain natriuretic peptide     Status: Abnormal   Collection Time: 07/14/17  5:36 PM  Result Value Ref Range   B Natriuretic Peptide 542.0 (H) 0.0 - 100.0 pg/mL  Troponin I     Status: Abnormal   Collection Time: 07/14/17  5:36 PM  Result Value Ref Range   Troponin I 0.06 (HH) <0.03 ng/mL  I-stat Chem 8, ED     Status: Abnormal   Collection Time: 07/14/17  5:42 PM  Result Value Ref Range   Sodium 132 (L) 135 - 145 mmol/L   Potassium 5.1 3.5 - 5.1 mmol/L   Chloride 100 (L) 101 - 111 mmol/L   BUN 49 (H) 6 - 20 mg/dL   Creatinine, Ser 1.90 (H) 0.44 - 1.00 mg/dL   Glucose, Bld 122 (H) 65 - 99 mg/dL   Calcium,  Ion 1.06 (L) 1.15 - 1.40 mmol/L   TCO2 18 0 - 100 mmol/L   Hemoglobin 10.9 (L) 12.0 - 15.0 g/dL   HCT 32.0 (L) 36.0 - 46.0 %   Dg Chest Port 1 View  Result Date: 07/14/2017 CLINICAL DATA:  Bradycardia, pt has external pacer pad applied, HR 30 EXAM: PORTABLE CHEST 1 VIEW COMPARISON:  Chest x-rays dated 07/08/2017 in 11/2027 2017. FINDINGS: Stable cardiomegaly. Overall cardiomediastinal silhouette is stable. Atherosclerotic changes noted at the aortic arch. Pacer pad overlying the left heart border. Central pulmonary vascular congestion and mild bilateral interstitial edema. No pleural effusion or pneumothorax seen. Prominent degenerative changes again noted at both shoulders. No acute or suspicious osseous finding. IMPRESSION: 1. Cardiomegaly with central pulmonary vascular congestion and mild bilateral interstitial edema, similar to previous exams, likely chronic mild CHF. 2. No evidence of pneumonia or overt alveolar pulmonary edema. 3. Aortic atherosclerosis. Electronically Signed   By: Franki Cabot M.D.   On: 07/14/2017 17:55    ASSESSMENT/PLAN: [name] is a 83 yo woman with acute on chronic diastolic heart failure, recent admission for volume overload, atrial fibrillation on anticoagulation, HTN, who was transferred from [place] for bradycardia.   Her ECG is consistent with her known atrial fibrillation. However, her ventricular rhythm is an escape rhythm, given the wide QRS morphology and regular interval. She is currently mentating, has a hypertensive blood pressure, and is in no distress. She is on diltiazem 180 mg daily as an outpatient. Nodal blockade, afib, and likely intrinsic conduction disease are mechanisms for her bradycardia.  Had an extensive discussion  at bedside with the patient and her two sons regarding her wishes. They want everything done now, including but not limited to temporary external pacing if required, temporary internal pacer if required, permanent pacemaker  if required. However, if she were to lose a pulse, they would not want an attempt at resuscitation. We discussed the fine line of this if she were to lose her junctional rhythm; they understand the complexity.  Bradycardia: atrial fibrillation with ventricular escape rhythm -hold diltiazem, will need washout.  -external pacing pads in place -if she were to become symptomatic or hypotensive, would start dopamine and externally pace. Would give calcium. Temp wire if external pacing required -not sure whether she would be a candidate for a permanent pacemaker if required, though both the patient and her sons would be interested if indicated  Atrial fibrillation -with no imminent plans for a procedure and a CHADSVASC of at least 5, will continue anticoagulation for now. Already on reduced dose apixaban.  HTN -allow permissive HTN for perfusion for now. Keep SBP <180  HLD -continue atorvastatin  SOB, acute on chronic diastolic heart failure: appears volume up on exam. Weights here are up from her prior. Renal function worse; may be congestion. Did improve renal function with diuretics during last admission -dose 40 IV lasix tonight  Chronic joint pain: was on scheduled tramadol and recently had change to her to 200 mg BID gabapentin. Sons concerned this was worsening drowsiness. Changed tramadol to PRN, monitor her pain and alertness.  Anemia: no active bleeding. Continue iron supplements.  Code status: DNR  Buford Dresser, MD, PhD, overnight cardiology provider

## 2017-07-14 NOTE — ED Notes (Signed)
Pt off unit at this time via [facility] 

## 2017-07-14 NOTE — ED Provider Notes (Signed)
[facility] DEPT Provider Note   CSN: [id number] Arrival date & time: 07/14/17  1651     History   Chief Complaint Chief Complaint  Patient presents with  . Shortness of Breath  . Bradycardia    HPI Wendy Perkins is a 83 y.o. female.  Patient from  [facility]. Patient not feeling well the past 2 days. Patient with admission on July 30 for abdominal pain but also had the fluid overload and exacerbation of congestive heart failure. Patient was diuresed discharge back to [facility]. Patient's Lasix doses were increased significantly. Patient also has a history of chronic atrial fib and she is anticoagulated. Patient is a DO NOT RESUSCITATE however mentally she is very functional. She's not able to walk on her own. At the nursing facility they tried to stand her up and she almost passed out. Laying down patient has no specific complaints.      Past Medical History:  Diagnosis Date  . Atrial fibrillation ([place])   . Breast nodule 11/15/2014  . Breast pain, left 11/15/2014  . Carotid artery disease ([place])   . Dizziness   . Dyspnea    Previous CPX suggesting possible restrictive physiology, respiratory muscle fatigue, diastolic dysfunction  . Essential hypertension, benign   . Hyperlipidemia   . Oxygen dependent    2 liter  . Rib pain on left side 11/15/2014  . Seasonal allergies   . Shingles 05/04/2015  . Toe fracture, left    left big toe    Patient Active Problem List   Diagnosis Date Noted  . Radicular pain of thoracic region 07/10/2017  . Bilateral leg edema 07/08/2017  . Hemorrhoids 06/03/2017  . Retinal vein occlusion of right eye 05/14/2017  . Internal carotid artery stenosis, bilateral 03/07/2017  . Back pain 03/07/2017  . Aphasia 12/06/2016  . Knee pain, acute 08/06/2016  . Fall 08/06/2016  . CVA (cerebral vascular accident) ([place]) 10/17/2015  . COPD (chronic obstructive pulmonary disease) ([place]) 10/17/2015  . UTI (urinary tract infection) 10/17/2015  .  Altered mental state 10/13/2015  . Elevated troponin 10/13/2015  . Shingles 05/04/2015  . Rib pain on left side 11/15/2014  . Breast pain, left 11/15/2014  . Breast nodule 11/15/2014  . Orthostatic hypotension 09/30/2013  . Atrial fibrillation ([place]) 11/23/2011  . Chronic diastolic heart failure ([place]) 11/23/2011  . Essential hypertension, benign 07/13/2010  . DYSPNEA 11/15/2009    Past Surgical History:  Procedure Laterality Date  . COLONOSCOPY  02/22/2012   Procedure: COLONOSCOPY;  Surgeon: Rogene Houston, MD;  Location: [facility];  Service: Endoscopy;  Laterality: N/A;  100  . KNEE SURGERY     bilateral    OB History    Gravida Para Term Preterm AB Living   4 4       4    SAB TAB Ectopic Multiple Live Births                   Home Medications    Prior to Admission medications   Medication Sig Start Date End Date Taking? Authorizing Provider  acetaminophen (TYLENOL) 500 MG tablet Take 1,000 mg by mouth 3 (three) times daily.    [provider]  Celedonio Miyamoto 62.5-25 MCG/INH AEPB Inhale 1 Inhaler into the lungs daily.  10/12/14   [provider]  apixaban (ELIQUIS) 2.5 MG TABS tablet Take 1 tablet (2.5 mg total) by mouth 2 (two) times daily. 12/08/16   Sinda Du, MD  atorvastatin (LIPITOR) 80 MG  tablet Take 1 tablet (80 mg total) by mouth daily at 6 PM. 10/17/15   Sinda Du, MD  cycloSPORINE (RESTASIS) 0.05 % ophthalmic emulsion Place 1 drop into both eyes every 12 (twelve) hours.     [provider]  diltiazem (CARDIZEM CD) 180 MG 24 hr capsule TAKE ONE CAPSULE BY MOUTH ONCE DAILY. 05/10/15   Satira Sark, MD  ferrous sulfate (KP FERROUS SULFATE) 325 (65 FE) MG tablet Take 325 mg by mouth daily with breakfast.    [provider]  furosemide (LASIX) 20 MG tablet Take 3 tablets (60 mg total) by mouth daily. 07/10/17   Orvan Falconer, MD  gabapentin (NEURONTIN) 100 MG capsule Take 1 capsule (100 mg total) by mouth 3 (three) times  daily. 06/25/17   Carole Civil, MD  hydrocortisone (ANUSOL-HC) 2.5 % rectal cream Place 1 application rectally 2 (two) times daily as needed for hemorrhoids or anal itching.    [provider]  Multiple Vitamin (MULITIVITAMIN WITH MINERALS) TABS Take 1 tablet by mouth every other day.     [provider]  omeprazole (PRILOSEC) 20 MG capsule Take 20 mg by mouth daily.      [provider]  OXYGEN Inhale 2 L into the lungs daily. Oxygen @@ 2 L/mn via qhs. Twice a day     [provider]  Polyethyl Glycol-Propyl Glycol (SYSTANE OP) Place 1 drop into both eyes 3 (three) times daily. For dry eyes    [provider]  polyethylene glycol (MIRALAX / GLYCOLAX) packet Take 17 g by mouth daily.    [provider]  potassium chloride (K-DUR,KLOR-CON) 10 MEQ tablet Take 20 mEq by mouth daily.    [provider]  PROAIR HFA 108 (90 BASE) MCG/ACT inhaler 2 puffs 3 (three) times daily as needed for wheezing (cough).  05/13/15   [provider]  promethazine (PHENERGAN) 12.5 MG tablet Take 12.5 mg by mouth every 6 (six) hours as needed for nausea or vomiting.    [provider]  traMADol (ULTRAM) 50 MG tablet Take 50 mg by mouth 2 (two) times daily.     [provider]    Family History Family History  Problem Relation Age of Onset  . Stroke Mother   . Pneumonia Father   . Heart disease Father   . Healthy Son   . Glaucoma Daughter   . Emphysema Daughter   . Healthy Daughter   . Other Daughter        had knee replacement  . Healthy Son   . Heart disease Maternal Grandmother     Social History Social History  Substance Use Topics  . Smoking status: Never Smoker  . Smokeless tobacco: Never Used  . Alcohol use No     Allergies   Sulfur   Review of Systems Review of Systems  Constitutional: Positive for fatigue.  HENT: Negative for congestion.   Eyes: Negative for redness.  Respiratory: Negative for  shortness of breath.   Cardiovascular: Negative for chest pain.  Gastrointestinal: Negative for abdominal pain.  Genitourinary: Negative for dysuria.  Skin: Negative for rash.  Neurological: Positive for weakness. Negative for syncope and headaches.  Hematological: Bruises/bleeds easily.  Psychiatric/Behavioral: Negative for confusion.     Physical Exam Updated Vital Signs BP (!) 153/82   Pulse (!) 33   Temp 97.6 F (36.4 C) (Oral)   Resp 16   Ht 1.626 m (5\' 4" )   Wt 63.5 kg (140 lb)  SpO2 100%   BMI 24.03 kg/m   Physical Exam  Constitutional: She is oriented to person, place, and time. She appears well-developed and well-nourished. No distress.  HENT:  Head: Normocephalic and atraumatic.  Mucous membranes slightly dry  Eyes: Pupils are equal, round, and reactive to light.  Neck: Neck supple.  Cardiovascular:  Marked bradycardia  Pulmonary/Chest: Effort normal and breath sounds normal. No respiratory distress. She has no rales.  Abdominal: Soft. Bowel sounds are normal. She exhibits no distension.  Musculoskeletal: Normal range of motion. She exhibits edema.  Neurological: She is alert and oriented to person, place, and time. She displays normal reflexes. No cranial nerve deficit or sensory deficit. She exhibits normal muscle tone.  Skin: Skin is warm.  Nursing note and vitals reviewed.    ED Treatments / Results  Labs (all labs ordered are listed, but only abnormal results are displayed) Labs Reviewed  COMPREHENSIVE METABOLIC PANEL - Abnormal; Notable for the following:       Result Value   Sodium 134 (*)    Potassium 5.2 (*)    CO2 20 (*)    Glucose, Bld 126 (*)    BUN 54 (*)    Creatinine, Ser 1.72 (*)    Calcium 8.7 (*)    Total Protein 5.9 (*)    Albumin 3.2 (*)    GFR calc non Af Amer 24 (*)    GFR calc Af Amer 28 (*)    All other components within normal limits  CBC WITH DIFFERENTIAL/PLATELET - Abnormal; Notable for the following:    WBC 11.3 (*)      RBC 3.60 (*)    Hemoglobin 10.1 (*)    HCT 31.8 (*)    Neutro Abs 8.6 (*)    Monocytes Absolute 1.3 (*)    All other components within normal limits  BRAIN NATRIURETIC PEPTIDE - Abnormal; Notable for the following:    B Natriuretic Peptide 542.0 (*)    All other components within normal limits  TROPONIN I - Abnormal; Notable for the following:    Troponin I 0.06 (*)    All other components within normal limits  I-STAT CHEM 8, ED - Abnormal; Notable for the following:    Sodium 132 (*)    Chloride 100 (*)    BUN 49 (*)    Creatinine, Ser 1.90 (*)    Glucose, Bld 122 (*)    Calcium, Ion 1.06 (*)    Hemoglobin 10.9 (*)    HCT 32.0 (*)    All other components within normal limits    EKG  EKG Interpretation  Date/Time:  Sunday July 14 2017 17:03:12 EDT Ventricular Rate:  33 PR Interval:    QRS Duration: 132 QT Interval:  605 QTC Calculation: 449 R Axis:   -88 Text Interpretation:  Junctional rhythm Nonspecific IVCD with LAD Anterolateral infarct, old Complete (3-degree) AV block Confirmed by Fredia Sorrow [phone]) on 07/14/2017 6:45:14 PM       Radiology Dg Chest Port 1 View  Result Date: 07/14/2017 CLINICAL DATA:  Bradycardia, pt has external pacer pad applied, HR 30 EXAM: PORTABLE CHEST 1 VIEW COMPARISON:  Chest x-rays dated 07/08/2017 in 11/2027 2017. FINDINGS: Stable cardiomegaly. Overall cardiomediastinal silhouette is stable. Atherosclerotic changes noted at the aortic arch. Pacer pad overlying the left heart border. Central pulmonary vascular congestion and mild bilateral interstitial edema. No pleural effusion or pneumothorax seen. Prominent degenerative changes again noted at both shoulders. No acute or suspicious osseous finding. IMPRESSION: 1. Cardiomegaly  with central pulmonary vascular congestion and mild bilateral interstitial edema, similar to previous exams, likely chronic mild CHF. 2. No evidence of pneumonia or overt alveolar pulmonary edema. 3. Aortic  atherosclerosis. Electronically Signed   By: Franki Cabot M.D.   On: 07/14/2017 17:55    Procedures Procedures (including critical care time)  CRITICAL CARE Performed by: Fredia Sorrow Total critical care time: 30 minutes Critical care time was exclusive of separately billable procedures and treating other patients. Critical care was necessary to treat or prevent imminent or life-threatening deterioration. Critical care was time spent personally by me on the following activities: development of treatment plan with patient and/or surrogate as well as nursing, discussions with consultants, evaluation of patient's response to treatment, examination of patient, obtaining history from patient or surrogate, ordering and performing treatments and interventions, ordering and review of laboratory studies, ordering and review of radiographic studies, pulse oximetry and re-evaluation of patient's condition.   Medications Ordered in ED Medications - No data to display   Initial Impression / Assessment and Plan / ED Course  I have reviewed the triage vital signs and the nursing notes.  Pertinent labs & imaging results that were available during my care of the patient were reviewed by me and considered in my medical decision making (see chart for details).     Patient brought over from [place] facility for not feeling well. When they tried to stand her up she almost passed out. Patient with a recent admission at the end of July for volume overload and congestive heart failure. Patient's been receiving large doses of Lasix of late. Patient has a history of atrial fibrillation and is anticoagulated.  Upon arrival here was noted the patient had a significantly slow heart rate with a heart rate being in the low 30s. An first glance suggestive of perhaps complete heart block. But not able to really distinguish P waves at all. Patient's previous EKG had a heart rate of 59 when she was admitted  before with atrial fib. So it could be that this is just a much slower heart rate of the same rhythm as her previous EKG.  Patient is not ambulatory on her own but mentally she is very intact. Patient is a DO NOT RESUSCITATE but patient is willing for pacemaker whether permanent or temporary as needed. Clinically was suspicious that perhaps her renal function had become elevated secondary to the high doses of Lasix and that perhaps her diltiazem level had gotten high and that this is a forced slow heart rate due to the calcium channel blocker.   Patient tolerated the slow heart rate here with laying down blood pressures have been very stable. Systolic pressures of than 150. External pacer pads were placed. Patient monitored.  Considered the atropine considered the calcium challenge the patient seemed to be tolerating the marked sinus bradycardia without any significant problems laying down. Completely asymptomatic currently  Discussed with cardiology at [facility] and they are in agreement with transferring her there in case she needs a temporary pacemaker. Patient will be transferred by [facility] she originally was going to step down within the called back and stated that she would go to ICU.  Patient overall stable other than the slow heart rate. Blood pressures are fine and she is mentating fine she follows commands. Patient asymptomatic laying in the bed.  Rest of workup showed no significant evidence of the congestive heart failure. Troponin was slightly elevated. And her BUN and creatinine was essentially doubled from  her recent admission.   Final Clinical Impressions(s) / ED Diagnoses   Final diagnoses:  Complete heart block [facility])    New Prescriptions New Prescriptions   No medications on file     Fredia Sorrow, MD 07/14/17 2015

## 2017-07-14 NOTE — ED Notes (Signed)
EDP at bedside  

## 2017-07-14 NOTE — ED Notes (Signed)
Wendy Hutchinson, RN reports [facility] staff says pt has gained 8lb of fluid in 24 hours and reports pt's gabapentin was doubled recently.

## 2017-07-14 NOTE — ED Triage Notes (Signed)
PAtient here from the [facility] for shortness of breath and left flank pain. Was d/c here for sample complaint Tuesday night.

## 2017-07-14 NOTE — ED Notes (Signed)
Pacer pads placed on pt.

## 2017-07-14 NOTE — ED Notes (Signed)
Report to [facility], RN-MC

## 2017-07-15 DIAGNOSIS — R001 Bradycardia, unspecified: Secondary | ICD-10-CM | POA: Diagnosis not present

## 2017-07-15 DIAGNOSIS — I442 Atrioventricular block, complete: Principal | ICD-10-CM

## 2017-07-15 DIAGNOSIS — I5033 Acute on chronic diastolic (congestive) heart failure: Secondary | ICD-10-CM | POA: Diagnosis not present

## 2017-07-15 LAB — BASIC METABOLIC PANEL
Anion gap: 12 (ref 5–15)
BUN: 53 mg/dL — AB (ref 6–20)
CALCIUM: 8.7 mg/dL — AB (ref 8.9–10.3)
CHLORIDE: 103 mmol/L (ref 101–111)
CO2: 19 mmol/L — ABNORMAL LOW (ref 22–32)
CREATININE: 1.52 mg/dL — AB (ref 0.44–1.00)
GFR calc Af Amer: 32 mL/min — ABNORMAL LOW (ref >60)
GFR, EST NON AFRICAN AMERICAN: 28 mL/min — AB (ref >60)
Glucose, Bld: 94 mg/dL (ref 65–99)
Potassium: 5.5 mmol/L — ABNORMAL HIGH (ref 3.5–5.1)
SODIUM: 134 mmol/L — AB (ref 135–145)

## 2017-07-15 LAB — CBC
HCT: 32.2 % — ABNORMAL LOW (ref 36.0–46.0)
Hemoglobin: 10.2 g/dL — ABNORMAL LOW (ref 12.0–15.0)
MCH: 27.1 pg (ref 26.0–34.0)
MCHC: 31.7 g/dL (ref 30.0–36.0)
MCV: 85.6 fL (ref 78.0–100.0)
PLATELETS: 236 10*3/uL (ref 150–400)
RBC: 3.76 MIL/uL — ABNORMAL LOW (ref 3.87–5.11)
RDW: 15.1 % (ref 11.5–15.5)
WBC: 11.9 10*3/uL — AB (ref 4.0–10.5)

## 2017-07-15 LAB — MAGNESIUM: MAGNESIUM: 2.3 mg/dL (ref 1.7–2.4)

## 2017-07-15 LAB — GLUCOSE, CAPILLARY: GLUCOSE-CAPILLARY: 96 mg/dL (ref 65–99)

## 2017-07-15 LAB — TSH: TSH: 1.446 u[IU]/mL (ref 0.350–4.500)

## 2017-07-15 LAB — PROTIME-INR
INR: 1.62
PROTHROMBIN TIME: 19.4 s — AB (ref 11.4–15.2)

## 2017-07-15 LAB — APTT: APTT: 39 s — AB (ref 24–36)

## 2017-07-15 MED ORDER — DICLOFENAC SODIUM 1 % TD GEL
2.0000 g | Freq: Two times a day (BID) | TRANSDERMAL | Status: DC | PRN
  Administered 2017-07-15 – 2017-07-17 (×2): 2 g via TOPICAL
  Filled 2017-07-15: qty 100

## 2017-07-15 MED ORDER — FUROSEMIDE 10 MG/ML IJ SOLN
40.0000 mg | Freq: Every day | INTRAMUSCULAR | Status: DC
  Administered 2017-07-15 – 2017-07-17 (×3): 40 mg via INTRAVENOUS
  Filled 2017-07-15 (×3): qty 4

## 2017-07-15 MED ORDER — ACETAMINOPHEN 325 MG PO TABS
650.0000 mg | ORAL_TABLET | Freq: Four times a day (QID) | ORAL | Status: DC | PRN
  Administered 2017-07-16: 650 mg via ORAL
  Filled 2017-07-15: qty 2

## 2017-07-15 NOTE — Progress Notes (Signed)
Paged Cardiology on call regarding patients HR dropping into the 20s with stable BP running 120-150s/50s. Patient has no complaints. Also brought to her attention that patient has only put out 200cc since lasix dose. Bladder scan showed 687cc in bladder. Patient is going to try to urinate again on the bed pan, but if there is no success MD will place an order for a foley. Will continue to monitor patient.  Levon Hedger, RN

## 2017-07-15 NOTE — Progress Notes (Signed)
Progress Note  Patient Name: Wendy Perkins Date of Encounter: 07/15/2017  Primary Cardiologist: Dr Domenic Polite  Subjective   Complains of dyspnea; no chest pain or dizziness  Inpatient Medications    Scheduled Meds: . apixaban  2.5 mg Oral BID  . atorvastatin  80 mg Oral q1800  . cycloSPORINE  1 drop Both Eyes Q12H  . ferrous sulfate  325 mg Oral Q breakfast  . gabapentin  200 mg Oral BID  . multivitamin with minerals  1 tablet Oral QODAY  . pantoprazole  40 mg Oral Daily  . polyethylene glycol  17 g Oral Daily  . polyvinyl alcohol   Both Eyes TID  . sodium chloride flush  3 mL Intravenous Q12H  . umeclidinium-vilanterol  1 puff Inhalation Daily   Continuous Infusions:  PRN Meds: acetaminophen, albuterol, traMADol   Vital Signs    Vitals:   07/15/17 0500 07/15/17 0530 07/15/17 0600 07/15/17 0630  BP: (!) 117/103 (!) 143/88 (!) 153/50 (!) 148/46  Pulse: (!) 30 (!) 31 61 68  Resp: 19 20 15 19   Temp:      TempSrc:      SpO2: 100% 99% 96% 100%  Weight:      Height:        Intake/Output Summary (Last 24 hours) at 07/15/17 0730 Last data filed at 07/15/17 0600  Gross per 24 hour  Intake              240 ml  Output             1000 ml  Net             -760 ml   Filed Weights   07/14/17 1653 07/14/17 2045  Weight: 63.5 kg (140 lb) 68.9 kg (151 lb 14.4 oz)    Telemetry    Atrial fibrillation with intermittent ventricular escape; bradycardia - Personally Reviewed  ECG    Atrial fibrillation with CHB and ventricular escape - Personally Reviewed  Physical Exam   GEN: Elderly, frail; No acute distress.   Neck: Supple Cardiac: irregular and bradycardic Respiratory: Clear to auscultation bilaterally. GI: Soft, nontender, non-distended  MS: Trace edema; No deformity. Neuro:  Nonfocal    Labs    Chemistry Recent Labs Lab 07/08/17 1641 07/09/17 0705 07/10/17 0700 07/11/17 0615 07/14/17 1719 07/14/17 1742  NA 138 142 136 137 134* 132*  K 4.7 3.8  3.9 4.1 5.2* 5.1  CL 104 106 102 101 103 100*  CO2 23 27 25  21* 20*  --   GLUCOSE 94 96 92 115* 126* 122*  BUN 29* 24* 25* 31* 54* 49*  CREATININE 1.01* 0.80 0.92 1.16* 1.72* 1.90*  CALCIUM 8.9 8.8* 8.5* 8.8* 8.7*  --   PROT 6.8 6.1*  --   --  5.9*  --   ALBUMIN 3.6 3.2*  --   --  3.2*  --   AST 33 25  --   --  36  --   ALT 25 22  --   --  30  --   ALKPHOS 134* 119  --   --  125  --   BILITOT 0.8 0.7  --   --  0.6  --   GFRNONAA 45* >60 51* 38* 24*  --   GFRAA 53* >60 59* 45* 28*  --   ANIONGAP 11 9 9 15 11   --      Hematology Recent Labs Lab 07/10/17 0700 07/11/17 0615 07/14/17 1719 07/14/17 1742  WBC 5.8 10.5 11.3*  --   RBC 3.54* 3.76* 3.60*  --   HGB 9.8* 10.2* 10.1* 10.9*  HCT 30.5* 33.3* 31.8* 32.0*  MCV 86.2 88.6 88.3  --   MCH 27.7 27.1 28.1  --   MCHC 32.1 30.6 31.8  --   RDW 13.8 14.1 14.9  --   PLT 240 280 258  --     Cardiac Enzymes Recent Labs Lab 07/08/17 1641 07/14/17 1736  TROPONINI <0.03 0.06*    BNP Recent Labs Lab 07/08/17 1641 07/14/17 1736  BNP 360.0* 542.0*       Radiology    Dg Chest Port 1 View  Result Date: 07/14/2017 CLINICAL DATA:  Bradycardia, pt has external pacer pad applied, HR 30 EXAM: PORTABLE CHEST 1 VIEW COMPARISON:  Chest x-rays dated 07/08/2017 in 11/2027 2017. FINDINGS: Stable cardiomegaly. Overall cardiomediastinal silhouette is stable. Atherosclerotic changes noted at the aortic arch. Pacer pad overlying the left heart border. Central pulmonary vascular congestion and mild bilateral interstitial edema. No pleural effusion or pneumothorax seen. Prominent degenerative changes again noted at both shoulders. No acute or suspicious osseous finding. IMPRESSION: 1. Cardiomegaly with central pulmonary vascular congestion and mild bilateral interstitial edema, similar to previous exams, likely chronic mild CHF. 2. No evidence of pneumonia or overt alveolar pulmonary edema. 3. Aortic atherosclerosis. Electronically Signed   By:  Franki Cabot M.D.   On: 07/14/2017 17:55    Patient Profile     83 y.o. female with past medical history of permanent atrial fibrillation, diastolic congestive heart failure admitted with complete heart block. Patient on Cardizem at home. Most recent echocardiogram 07/09/2017 showed normal LV systolic function, mitral annular calcification with mild to moderate mitral stenosis and mild mitral regurgitation, severe biatrial enlargement, mild right ventricular enlargement and moderate pulmonary hypertension.  Assessment & Plan    1 complete heart block/bradycardia-patient admitted with complete heart block and heart rate in the 30s. Blood pressure is normal at present. We will continue to hold Cardizem. Her heart rate is in the 50s and 60s this morning. Hopefully we can avoid pacemaker as she would be a poor candidate if necessary. Family is not available this morning but notes state they would like everything done except she is a no CODE BLUE.  2 permanent atrial fibrillation-as outlined above hold Cardizem. CHADSvasc 5. Continue apixaban.   3 acute on chronic diastolic congestive heart failure-patient mildly volume overloaded. Some of this may be related to decreased cardiac output secondary to bradycardia. Continue to hold Cardizem. Will give Lasix 40 mg IV today. Follow renal function.  4 acute on chronic stage III kidney disease-renal function is worse compared to most recent admission. Likely secondary to diuresis. However she remains mildly volume overloaded. Lasix 40 mg today with repeat laboratories tomorrow morning.  5 hypertension-Cardizem held because of bradycardia. Follow blood pressure and add medications as needed. Avoid AV nodal blocking agents.  6 hyperlipidemia-continue statin.  Signed, Kirk Ruths, MD  07/15/2017, 7:30 AM

## 2017-07-15 NOTE — Care Management Note (Signed)
Case Management Note Marvetta Gibbons RN, BSN Unit 4E-Case Manager- [place] coverage [phone]  Patient Details  Name: Wendy Perkins MRN: [id number] Date of Birth: February 10, 1919  Subjective/Objective:   Pt admitted with complete HB/bradycardia                 Action/Plan: PTA pt was at [place] consulted - CM to follow  Expected Discharge Date:                  Expected Discharge Plan:  [place]  In-House Referral:  Clinical Social Work  Discharge planning Services  CM Consult  Post Acute Care Choice:    Choice offered to:     DME Arranged:    DME Agency:     HH Arranged:    [place] Agency:     Status of Service:  In process, will continue to follow  If discussed at Long Length of Stay Meetings, dates discussed:    Discharge Disposition:   Additional Comments:  Dawayne Patricia, RN 07/15/2017, 10:46 AM

## 2017-07-15 NOTE — Progress Notes (Signed)
Patients HR 3rd degree HB in 30's, bp stable at this time, patient alert and oriented though sleepy. Family at bedside requesting to speak to MD. MD Stanford Breed made aware. Will continue to monitor.

## 2017-07-15 NOTE — Progress Notes (Signed)
Patient complaining of being uncomfortable from having the urge to urinate, but not being able to. I bladder scanned the patient and she had 672cc in bladder. Order received to place foley catheter and get a UA for culture. Will carry out these orders and continue to monitor patient.  Levon Hedger, RN

## 2017-07-15 NOTE — Progress Notes (Addendum)
Pt's HR remains in junctional rhythm mid 30's, BP stable at this time.  Pt complaining of knee pain, family states patient uses Voltaren cream and this alleviates the pain. Pa Barrett updated and notified. Voltaren cream verified with pharmacy that it does not have any systemic absorption. Will continue to monitor.

## 2017-07-16 DIAGNOSIS — I482 Chronic atrial fibrillation: Secondary | ICD-10-CM | POA: Diagnosis not present

## 2017-07-16 DIAGNOSIS — I442 Atrioventricular block, complete: Secondary | ICD-10-CM | POA: Diagnosis not present

## 2017-07-16 LAB — BASIC METABOLIC PANEL
Anion gap: 11 (ref 5–15)
BUN: 47 mg/dL — AB (ref 6–20)
CHLORIDE: 102 mmol/L (ref 101–111)
CO2: 20 mmol/L — AB (ref 22–32)
CREATININE: 1.35 mg/dL — AB (ref 0.44–1.00)
Calcium: 8.4 mg/dL — ABNORMAL LOW (ref 8.9–10.3)
GFR calc non Af Amer: 32 mL/min — ABNORMAL LOW (ref >60)
GFR, EST AFRICAN AMERICAN: 37 mL/min — AB (ref >60)
Glucose, Bld: 107 mg/dL — ABNORMAL HIGH (ref 65–99)
Potassium: 4.7 mmol/L (ref 3.5–5.1)
Sodium: 133 mmol/L — ABNORMAL LOW (ref 135–145)

## 2017-07-16 LAB — URINE CULTURE

## 2017-07-16 LAB — GLUCOSE, CAPILLARY: Glucose-Capillary: 93 mg/dL (ref 65–99)

## 2017-07-16 MED ORDER — SODIUM CHLORIDE 0.9 % IV SOLN
INTRAVENOUS | Status: DC
  Administered 2017-07-17 (×2): via INTRAVENOUS

## 2017-07-16 MED ORDER — CHLORHEXIDINE GLUCONATE 4 % EX LIQD
60.0000 mL | Freq: Once | CUTANEOUS | Status: AC
  Administered 2017-07-16: 4 via TOPICAL
  Filled 2017-07-16: qty 15

## 2017-07-16 MED ORDER — CHLORHEXIDINE GLUCONATE 4 % EX LIQD
60.0000 mL | Freq: Once | CUTANEOUS | Status: AC
  Administered 2017-07-17: 4 via TOPICAL
  Filled 2017-07-16: qty 45

## 2017-07-16 MED ORDER — SODIUM CHLORIDE 0.9 % IR SOLN
80.0000 mg | Status: DC
  Administered 2017-07-17: 80 mg
  Filled 2017-07-16: qty 2

## 2017-07-16 MED ORDER — CEFAZOLIN SODIUM-DEXTROSE 2-4 GM/100ML-% IV SOLN
2.0000 g | INTRAVENOUS | Status: AC
  Administered 2017-07-17: 2 g via INTRAVENOUS
  Filled 2017-07-16: qty 100

## 2017-07-16 NOTE — Progress Notes (Signed)
Pt now in afib, HR in 70's-80's. BP 144/54. Great urine output. Sieler, NP paged, to make aware. Will continue to monitor closely.   Lucius Conn, RN

## 2017-07-16 NOTE — Consult Note (Signed)
ELECTROPHYSIOLOGY CONSULT NOTE    Patient ID: Wendy Perkins MRN: [id number], DOB/AGE: 1919-09-02 83 y.o.  Admit date: 07/14/2017 Date of Consult: 07/16/2017  Primary Physician: Virgie Dad, MD Primary Cardiologist: Domenic Polite Electrophysiologist: Mahum Betten (new this admission)  Patient Profile: Wendy Perkins is a 83 y.o. female with a history of permanent atrial fibrillation, diastolic congestive heart failure admitted with complete heart block. Patient on Cardizem at home. Most recent echocardiogram 07/09/2017 showed normal LV systolic function, mitral annular calcification with mild to moderate mitral stenosis and mild mitral regurgitation, severe biatrial enlargement, mild right ventricular enlargement and moderate pulmonary hypertension. who is being seen today for the evaluation of symptomatic bradycardia at the request of [place].  HPI:  The patient lives at [facility] and is relatively sedentary.  For the past week, her family has noted worsening shortness of breath as well as worsening fatigue. She also complains of intermittent dizziness and her son states that she had a pre-syncopal spell while trying to stand prior to admission. They brought her to the hospital for further evaluation where she was found to be markedly bradycardic. Her Cardizem was discontinued and her bradycardia persists with heart rates mainly in the 30's and 40's even during waking hours. She does not complain of chest pain. She also has chronic orthopedic complaints.   Echo 06/2017 demonstrated EF 60-65%, mild LVH, no RWMA, mild to moderate MS, LA severely dilated   Past Medical History:  Diagnosis Date  . Atrial fibrillation ([place])   . Breast nodule 11/15/2014  . Breast pain, left 11/15/2014  . Carotid artery disease ([place])   . Dizziness   . Dyspnea    Previous CPX suggesting possible restrictive physiology, respiratory muscle fatigue, diastolic dysfunction  . Essential hypertension, benign   .  Hyperlipidemia   . Oxygen dependent    2 liter  . Rib pain on left side 11/15/2014  . Seasonal allergies   . Shingles 05/04/2015  . Toe fracture, left    left big toe     Surgical History:  Past Surgical History:  Procedure Laterality Date  . COLONOSCOPY  02/22/2012   Procedure: COLONOSCOPY;  Surgeon: Rogene Houston, MD;  Location: [facility];  Service: Endoscopy;  Laterality: N/A;  100  . KNEE SURGERY     bilateral     Prescriptions Prior to Admission  Medication Sig Dispense Refill Last Dose  . acetaminophen (TYLENOL) 500 MG tablet Take 1,000 mg by mouth 3 (three) times daily. Max of 3 grams acetaminophen per 24 hrs from all sources.   07/14/2017 at 1400  . ANORO ELLIPTA 62.5-25 MCG/INH AEPB Inhale 1 Inhaler into the lungs daily.    07/14/2017 at 0900  . apixaban (ELIQUIS) 2.5 MG TABS tablet Take 1 tablet (2.5 mg total) by mouth 2 (two) times daily. 60 tablet  07/14/2017 at 0900  . atorvastatin (LIPITOR) 80 MG tablet Take 1 tablet (80 mg total) by mouth daily at 6 PM.   Past Week at Unknown time  . cycloSPORINE (RESTASIS) 0.05 % ophthalmic emulsion Place 1 drop into both eyes every 12 (twelve) hours.    07/14/2017 at 0900  . diltiazem (CARDIZEM CD) 180 MG 24 hr capsule TAKE ONE CAPSULE BY MOUTH ONCE DAILY. (Patient taking differently: TAKE ONE CAPSULE (180mg ) BY MOUTH ONCE DAILY.) 30 capsule 6 07/14/2017 at 0900  . ferrous sulfate (KP FERROUS SULFATE) 325 (65 FE) MG tablet Take 325 mg by mouth daily with breakfast.   07/14/2017 at 0900  .  furosemide (LASIX) 20 MG tablet Take 3 tablets (60 mg total) by mouth daily. 30 tablet 1 07/14/2017 at 0900  . gabapentin (NEURONTIN) 100 MG capsule Take 1 capsule (100 mg total) by mouth 3 (three) times daily. (Patient taking differently: Take 200 mg by mouth 2 (two) times daily. ) 90 capsule 2 07/14/2017 at 0900  . Multiple Vitamin (MULITIVITAMIN WITH MINERALS) TABS Take 1 tablet by mouth every other day.    07/13/2017 at 0900  . omeprazole (PRILOSEC) 20 MG  capsule Take 20 mg by mouth daily.     07/14/2017 at 0600  . OXYGEN Inhale 2 L into the lungs 2 (two) times daily. 3:15pm to 11:15pm 11:15pm to 07:15am   07/14/2017 at Unknown time  . Polyethyl Glycol-Propyl Glycol (SYSTANE OP) Place 1 drop into both eyes 3 (three) times daily. For dry eyes   07/14/2017 at 1400  . polyethylene glycol (MIRALAX / GLYCOLAX) packet Take 17 g by mouth daily. Hold for diarrhea   07/14/2017 at 0900  . potassium chloride (K-DUR,KLOR-CON) 10 MEQ tablet Take 20 mEq by mouth daily.   07/14/2017 at 0900  . PROAIR HFA 108 (90 BASE) MCG/ACT inhaler Inhale 2 puffs into the lungs 3 (three) times daily as needed for wheezing (cough).    07/13/2017 at 1010  . promethazine (PHENERGAN) 12.5 MG tablet Take 12.5 mg by mouth every 6 (six) hours as needed for nausea or vomiting.   06/01/2017 at 1714  . traMADol (ULTRAM) 50 MG tablet Take 50 mg by mouth 2 (two) times daily.    07/14/2017 at 0600    Inpatient Medications: . apixaban  2.5 mg Oral BID  . atorvastatin  80 mg Oral q1800  . cycloSPORINE  1 drop Both Eyes Q12H  . ferrous sulfate  325 mg Oral Q breakfast  . furosemide  40 mg Intravenous Daily  . gabapentin  200 mg Oral BID  . multivitamin with minerals  1 tablet Oral QODAY  . pantoprazole  40 mg Oral Daily  . polyethylene glycol  17 g Oral Daily  . polyvinyl alcohol   Both Eyes TID  . sodium chloride flush  3 mL Intravenous Q12H  . umeclidinium-vilanterol  1 puff Inhalation Daily    Allergies:  Allergies  Allergen Reactions  . Sulfur Swelling and Rash    Social History   Social History  . Marital status: Widowed    Spouse name: N/A  . Number of children: N/A  . Years of education: N/A   Occupational History  . Not on file.   Social History Main Topics  . Smoking status: Never Smoker  . Smokeless tobacco: Never Used  . Alcohol use No  . Drug use: No  . Sexual activity: No   Other Topics Concern  . Not on file   Social History Narrative  . No narrative on file      Family History  Problem Relation Age of Onset  . Stroke Mother   . Pneumonia Father   . Heart disease Father   . Healthy Son   . Glaucoma Daughter   . Emphysema Daughter   . Healthy Daughter   . Other Daughter        had knee replacement  . Healthy Son   . Heart disease Maternal Grandmother      Review of Systems: All other systems reviewed and are otherwise negative except as noted above.  Physical Exam: Vitals:   07/16/17 0700 07/16/17 0800 07/16/17 0817 07/16/17 0830  BP: Marland Kitchen)  119/44 (!) 163/101  (!) 173/47  Pulse: (!) 34 70  (!) 36  Resp: 17 16  (!) 22  Temp: 97.8 F (36.6 C)     TempSrc: Oral     SpO2: 100% 100% 100% 100%  Weight:      Height:        GEN- The patient is elderly and frail appearing, alert and oriented x 3 today.   HEENT: normocephalic, atraumatic; sclera clear, conjunctiva pink; hearing intact; oropharynx clear; neck supple Lungs- Clear to ausculation bilaterally, normal work of breathing.  No wheezes, rales, rhonchi Heart- Irregular bradycardic rate and rhythm  GI- soft, non-tender, non-distended, bowel sounds present Extremities- no clubbing, cyanosis, or edema  MS- no significant deformity or atrophy Skin- warm and dry, no rash or lesion Psych- euthymic mood, full affect Neuro- strength and sensation are intact  Labs:  Lab Results  Component Value Date   WBC 11.9 (H) 07/15/2017   HGB 10.2 (L) 07/15/2017   HCT 32.2 (L) 07/15/2017   MCV 85.6 07/15/2017   PLT 236 07/15/2017    Recent Labs Lab 07/14/17 1719  07/16/17 0145  NA 134*  < > 133*  K 5.2*  < > 4.7  CL 103  < > 102  CO2 20*  < > 20*  BUN 54*  < > 47*  CREATININE 1.72*  < > 1.35*  CALCIUM 8.7*  < > 8.4*  PROT 5.9*  --   --   BILITOT 0.6  --   --   ALKPHOS 125  --   --   ALT 30  --   --   AST 36  --   --   GLUCOSE 126*  < > 107*  < > = values in this interval not displayed.    Radiology/Studies: Dg Chest 2 View Result Date: 07/08/2017 CLINICAL DATA:  Abdominal pain  EXAM: CHEST  2 VIEW COMPARISON:  06/24/2017 FINDINGS: Stable cardiac enlargement. There is evidence of associated mild interstitial edema/CHF. Probable tiny amount of bilateral posterior pleural fluid without significant pleural effusions. No focal airspace consolidation or pneumothorax. IMPRESSION: Cardiomegaly with associated mild interstitial edema/ CHF. Electronically Signed   By: Aletta Edouard M.D.   On: 07/08/2017 19:04    JJO:ACZYSA fibrillation with underlying complete heart block, junctional escape 33 bpm (personally reviewed)  TELEMETRY: atrial fibrillation, ventricular rates predominately 30's-40's (personally reviewed)  Assessment/Plan: 1.  Symptomatic bradycardia The patient has symptomatic bradycardia in the setting of permanent atrial fibrillation.  Her AVN blocking agents have been held without improvement. At this point, consideration of PPM implant would be reasonable. Because of advanced age, she is at increased risk for any procedure. I have discussed this fully with the family. They would like to proceed. We have tentatively scheduled for tomorrow with Dr Caryl Comes. Hold Eliquis starting tonight  2.  Permanent atrial fibrillation Continue Eliquis long term for CHADS2VASC of 4 CBC stable  3.  HTN Avoid AVN blocking agents until PPM in place Could use Hydralazine if needed  Dr Rayann Heman to see later today    Signed, Chanetta Marshall 07/16/2017 10:21 AM   I have seen, examined the patient, and reviewed the above assessment and plan.  On exam, pleasant and elderly.  Bradycardic irregular rhythm.  Changes to above are made where necessary.  She has symptomatic bradycardia in the setting of permanent afib.  No reversible causes are found.   I would therefore recommend pacemaker implantation at this time.  Risks, benefits, alternatives to pacemaker  implantation were discussed in detail with the patient and her family today. The patient and family understand that the risks include but  are not limited to bleeding, infection, pneumothorax, perforation, tamponade, vascular damage, renal failure, MI, stroke, death,  and lead dislodgement and wishes to proceed. We will hold eliquis and proceed with PPM tomorrow.   Co Sign: Thompson Grayer, MD 07/16/2017 11:36 AM

## 2017-07-16 NOTE — Progress Notes (Signed)
Paged Dr Radford Pax with cardiology regarding patients HR ranging from 30-100s in atrial fib, BP 170-190/70s, and patient crying out in pain from neck radiating down in the middle of her back. Orders received to try tylenol and if that doesn't work then we will reassess. Will continue to monitor patient.   Levon Hedger, RN

## 2017-07-16 NOTE — Progress Notes (Signed)
Progress Note  Patient Name: Wendy Perkins Date of Encounter: 07/16/2017  Primary Cardiologist: Dr Domenic Polite  Subjective   Complains of dyspnea unchanged; no chest pain; more alert today.  Inpatient Medications    Scheduled Meds: . apixaban  2.5 mg Oral BID  . atorvastatin  80 mg Oral q1800  . cycloSPORINE  1 drop Both Eyes Q12H  . ferrous sulfate  325 mg Oral Q breakfast  . furosemide  40 mg Intravenous Daily  . gabapentin  200 mg Oral BID  . multivitamin with minerals  1 tablet Oral QODAY  . pantoprazole  40 mg Oral Daily  . polyethylene glycol  17 g Oral Daily  . polyvinyl alcohol   Both Eyes TID  . sodium chloride flush  3 mL Intravenous Q12H  . umeclidinium-vilanterol  1 puff Inhalation Daily   Continuous Infusions:  PRN Meds: acetaminophen, albuterol, diclofenac sodium, traMADol   Vital Signs    Vitals:   07/16/17 0530 07/16/17 0600 07/16/17 0630 07/16/17 0700  BP: (!) 139/39 (!) 137/44 (!) 132/50 (!) 119/44  Pulse: (!) 32 (!) 45 (!) 33 (!) 34  Resp: 19 16 18 17   Temp:    97.8 F (36.6 C)  TempSrc:    Oral  SpO2: 99% 100% 100% 100%  Weight:      Height:        Intake/Output Summary (Last 24 hours) at 07/16/17 0750 Last data filed at 07/16/17 0600  Gross per 24 hour  Intake              240 ml  Output             2040 ml  Net            -1800 ml   Filed Weights   07/14/17 1653 07/14/17 2045 07/16/17 0330  Weight: 63.5 kg (140 lb) 68.9 kg (151 lb 14.4 oz) 68.1 kg (150 lb 2.1 oz)    Telemetry    Atrial fibrillation with intermittent ventricular escape; HR 30s with rare episodes in 20s - Personally Reviewed  Physical Exam   GEN: Elderly, frail; NAD Neck: No JVD Cardiac: Irregular; bradycardic Respiratory: CTA GI: Soft, nontender, non-distended, no masses MS: No edema Neuro:  Grossly intact   Labs    Chemistry  Recent Labs Lab 07/14/17 1719 07/14/17 1742 07/15/17 0805 07/16/17 0145  NA 134* 132* 134* 133*  K 5.2* 5.1 5.5* 4.7    CL 103 100* 103 102  CO2 20*  --  19* 20*  GLUCOSE 126* 122* 94 107*  BUN 54* 49* 53* 47*  CREATININE 1.72* 1.90* 1.52* 1.35*  CALCIUM 8.7*  --  8.7* 8.4*  PROT 5.9*  --   --   --   ALBUMIN 3.2*  --   --   --   AST 36  --   --   --   ALT 30  --   --   --   ALKPHOS 125  --   --   --   BILITOT 0.6  --   --   --   GFRNONAA 24*  --  28* 32*  GFRAA 28*  --  32* 37*  ANIONGAP 11  --  12 11     Hematology  Recent Labs Lab 07/11/17 0615 07/14/17 1719 07/14/17 1742 07/15/17 0805  WBC 10.5 11.3*  --  11.9*  RBC 3.76* 3.60*  --  3.76*  HGB 10.2* 10.1* 10.9* 10.2*  HCT 33.3* 31.8* 32.0* 32.2*  MCV 88.6 88.3  --  85.6  MCH 27.1 28.1  --  27.1  MCHC 30.6 31.8  --  31.7  RDW 14.1 14.9  --  15.1  PLT 280 258  --  236    Cardiac Enzymes  Recent Labs Lab 07/14/17 1736  TROPONINI 0.06*    BNP  Recent Labs Lab 07/14/17 1736  BNP 542.0*       Radiology    Dg Chest Port 1 View  Result Date: 07/14/2017 CLINICAL DATA:  Bradycardia, pt has external pacer pad applied, HR 30 EXAM: PORTABLE CHEST 1 VIEW COMPARISON:  Chest x-rays dated 07/08/2017 in 11/2027 2017. FINDINGS: Stable cardiomegaly. Overall cardiomediastinal silhouette is stable. Atherosclerotic changes noted at the aortic arch. Pacer pad overlying the left heart border. Central pulmonary vascular congestion and mild bilateral interstitial edema. No pleural effusion or pneumothorax seen. Prominent degenerative changes again noted at both shoulders. No acute or suspicious osseous finding. IMPRESSION: 1. Cardiomegaly with central pulmonary vascular congestion and mild bilateral interstitial edema, similar to previous exams, likely chronic mild CHF. 2. No evidence of pneumonia or overt alveolar pulmonary edema. 3. Aortic atherosclerosis. Electronically Signed   By: Franki Cabot M.D.   On: 07/14/2017 17:55    Patient Profile     83 y.o. female with past medical history of permanent atrial fibrillation, diastolic congestive  heart failure admitted with complete heart block. Patient on Cardizem at home. Most recent echocardiogram 07/09/2017 showed normal LV systolic function, mitral annular calcification with mild to moderate mitral stenosis and mild mitral regurgitation, severe biatrial enlargement, mild right ventricular enlargement and moderate pulmonary hypertension.  Assessment & Plan    1 complete heart block/bradycardia-patient admitted with complete heart block and heart rate in the 30s. Cardizem has been held for the past 48 hours and she remains bradycardic with occasional ventricular escape beats. Long discussion with patient and family today. She would like to consider pacemaker and family in agreement. I will ask electrophysiology to review. She is a no CODE BLUE.  2 permanent atrial fibrillation-as outlined above; Cardizem DCed. CHADSvasc 5. Continue apixaban.   3 acute on chronic diastolic congestive heart failure-patient remains mildly volume overloaded. Some of this may be related to decreased cardiac output secondary to bradycardia. Will treat with lasix 40 mg IV daily. Follow renal function.  4 acute on chronic stage III kidney disease-continue lasix as outlined above and follow renal function.   5 hypertension-Cardizem held because of bradycardia. Follow blood pressure and add medications as needed (would likely use hydralazine). Avoid AV nodal blocking agents.  6 hyperlipidemia-continue statin.  Signed, Kirk Ruths, MD  07/16/2017, 7:50 AM

## 2017-07-17 ENCOUNTER — Inpatient Hospital Stay (HOSPITAL_COMMUNITY): Payer: Medicare Other

## 2017-07-17 ENCOUNTER — Encounter (HOSPITAL_COMMUNITY)
Admission: EM | Disposition: A | Discharge status: Skilled Nursing Facility | Payer: Self-pay | Source: Home / Self Care | Attending: Cardiovascular Disease

## 2017-07-17 DIAGNOSIS — I482 Chronic atrial fibrillation: Secondary | ICD-10-CM | POA: Diagnosis not present

## 2017-07-17 DIAGNOSIS — I10 Essential (primary) hypertension: Secondary | ICD-10-CM | POA: Diagnosis not present

## 2017-07-17 DIAGNOSIS — I442 Atrioventricular block, complete: Secondary | ICD-10-CM | POA: Diagnosis not present

## 2017-07-17 DIAGNOSIS — I495 Sick sinus syndrome: Secondary | ICD-10-CM | POA: Diagnosis present

## 2017-07-17 HISTORY — PX: PACEMAKER IMPLANT: EP1218

## 2017-07-17 LAB — BASIC METABOLIC PANEL
ANION GAP: 10 (ref 5–15)
BUN: 29 mg/dL — ABNORMAL HIGH (ref 6–20)
CALCIUM: 8.6 mg/dL — AB (ref 8.9–10.3)
CO2: 22 mmol/L (ref 22–32)
Chloride: 106 mmol/L (ref 101–111)
Creatinine, Ser: 0.87 mg/dL (ref 0.44–1.00)
GFR, EST NON AFRICAN AMERICAN: 54 mL/min — AB (ref >60)
GLUCOSE: 109 mg/dL — AB (ref 65–99)
POTASSIUM: 3.8 mmol/L (ref 3.5–5.1)
Sodium: 138 mmol/L (ref 135–145)

## 2017-07-17 LAB — GLUCOSE, CAPILLARY: GLUCOSE-CAPILLARY: 111 mg/dL — AB (ref 65–99)

## 2017-07-17 LAB — SURGICAL PCR SCREEN
MRSA, PCR: NEGATIVE
STAPHYLOCOCCUS AUREUS: NEGATIVE

## 2017-07-17 SURGERY — PACEMAKER IMPLANT

## 2017-07-17 MED ORDER — ACETAMINOPHEN 325 MG PO TABS
325.0000 mg | ORAL_TABLET | ORAL | Status: DC | PRN
  Administered 2017-07-17 – 2017-07-19 (×5): 650 mg via ORAL
  Filled 2017-07-17 (×5): qty 2

## 2017-07-17 MED ORDER — LIDOCAINE HCL (PF) 1 % IJ SOLN
INTRAMUSCULAR | Status: DC | PRN
  Administered 2017-07-17: 45 mL via INTRADERMAL

## 2017-07-17 MED ORDER — IOPAMIDOL (ISOVUE-300) INJECTION 61%
INTRAVENOUS | Status: AC
  Filled 2017-07-17: qty 75

## 2017-07-17 MED ORDER — IOPAMIDOL (ISOVUE-370) INJECTION 76%
INTRAVENOUS | Status: AC
  Filled 2017-07-17: qty 50

## 2017-07-17 MED ORDER — SODIUM CHLORIDE 0.9 % IV SOLN: INTRAVENOUS | Status: AC

## 2017-07-17 MED ORDER — ORAL CARE MOUTH RINSE
15.0000 mL | Freq: Two times a day (BID) | OROMUCOSAL | Status: DC
  Administered 2017-07-17: 15 mL via OROMUCOSAL

## 2017-07-17 MED ORDER — CEFAZOLIN SODIUM-DEXTROSE 1-4 GM/50ML-% IV SOLN
1.0000 g | Freq: Four times a day (QID) | INTRAVENOUS | Status: AC
  Administered 2017-07-17 – 2017-07-18 (×3): 1 g via INTRAVENOUS
  Filled 2017-07-17 (×3): qty 50

## 2017-07-17 MED ORDER — HEPARIN (PORCINE) IN NACL 2-0.9 UNIT/ML-% IJ SOLN
INTRAMUSCULAR | Status: AC | PRN
  Administered 2017-07-17: 500 mL

## 2017-07-17 MED ORDER — HEPARIN (PORCINE) IN NACL 2-0.9 UNIT/ML-% IJ SOLN
INTRAMUSCULAR | Status: AC
  Filled 2017-07-17: qty 500

## 2017-07-17 MED ORDER — ONDANSETRON HCL 4 MG/2ML IJ SOLN: 4.0000 mg | Freq: Four times a day (QID) | INTRAMUSCULAR | Status: DC | PRN

## 2017-07-17 MED ORDER — SODIUM CHLORIDE 0.9 % IR SOLN
Status: AC
  Filled 2017-07-17: qty 2

## 2017-07-17 MED ORDER — SODIUM CHLORIDE 0.9 % IV SOLN
INTRAVENOUS | Status: DC
  Administered 2017-07-17: 10:00:00 via INTRAVENOUS

## 2017-07-17 MED ORDER — CEFAZOLIN SODIUM-DEXTROSE 2-4 GM/100ML-% IV SOLN
INTRAVENOUS | Status: AC
  Filled 2017-07-17: qty 100

## 2017-07-17 MED ORDER — LIDOCAINE HCL (PF) 1 % IJ SOLN
INTRAMUSCULAR | Status: AC
  Filled 2017-07-17: qty 30

## 2017-07-17 SURGICAL SUPPLY — 8 items
CABLE SURGICAL S-101-97-12 (CABLE) ×3 IMPLANT
HEMOSTAT SURGICEL 2X4 FIBR (HEMOSTASIS) ×3 IMPLANT
KIT MICROINTRODUCER STIFF 5F (SHEATH) ×3 IMPLANT
LEAD ISOFLEX OPT 1948-52CM (Lead) ×3 IMPLANT
PACEMAKER ASSURITY SR-SF (Pacemaker) ×3 IMPLANT
PAD DEFIB LIFELINK (PAD) ×3 IMPLANT
SHEATH CLASSIC 7F (SHEATH) ×3 IMPLANT
TRAY PACEMAKER INSERTION (PACKS) ×3 IMPLANT

## 2017-07-17 NOTE — Discharge Summary (Signed)
ELECTROPHYSIOLOGY PROCEDURE DISCHARGE SUMMARY    Patient ID: Wendy Perkins,  MRN: [id number], DOB/AGE: 1919/08/04 83 y.o.  Admit date: 07/14/2017 Discharge date: 07/19/2017  Primary Care Physician: Virgie Dad, MD  Primary Cardiologist: Dr. Domenic Polite Electrophysiologist: Dr. Caryl Comes  Primary Discharge Diagnosis:  1. CHB 2. Neck pain  Secondary Discharge Diagnosis:  1. Permanent Afib     CHA2DS2Vasc is 5, on Eliquis 2. Chronic CHF (diastolic) 3. HTN  Allergies  Allergen Reactions  . Sulfur Swelling and Rash     Procedures This Admission:  1.  Implantation of a SJM single chamber PPM on 07/17/17 by Dr Caryl Comes.  See implant record for details 2.  CXR on 07/18/17 demonstrated no pneumothorax status post device implantation.   Brief HPI: Wendy Perkins is a 83 y.o. female was admitted to [facility] with c/o SOB, dizziness, and a near syncopal event, found to be bradycardic in [place] Hospital Course:  The patient PMHx includes permanent atrial fibrillation, diastolic congestive heart failure, HTN.  She was admitted to ICU her diltiazem held with improvement initially of her HR.  Though ultimately again had bradycardia into the 30;s, and yesterday with improved resting rates 70's remained with pauses intermittently and was decided to g ahead and pursue PPM. She developed L sided neck pain over night that was exacerbated with movement, CT was negative or any acute findings, she had no trauma and felt to be muscular. She underwent implantation of a PPM with details as outlined above on 07/17/17.  She was monitored on telemetry overnight which demonstrated AF with V pacing.  Left chest was without hematoma or ecchymosis.  The device was interrogated and found to be functioning normally.  CXR was obtained and demonstrated no pneumothorax status post device implantation.  Wound care, arm mobility, and restrictions were reviewed with the patient.  The patient was examined by Dr. Caryl Comes and considered  stable for discharge to SNF.   The patient did require I/O cath during admission.  SNF has been made aware. She will need follow up with PCP to evaluate going forward. Ok to do I/O cath as [facility] at [facility] until she can be seen by primary care.   Physical Exam: Vitals:   07/18/17 2014 07/18/17 2341 07/19/17 0402 07/19/17 0757  BP: (!) 129/49 (!) 133/52 (!) 111/42 (!) 118/46  Pulse: 74 83 76 62  Resp: 18 18 18 18   Temp: 99.4 F (37.4 C) 99.2 F (37.3 C) 98.9 F (37.2 C) 97.9 F (36.6 C)  TempSrc: Oral Oral Oral Oral  SpO2: 97% 94% 92% 93%  Weight:   140 lb 11.2 oz (63.8 kg)   Height:        Labs:   Lab Results  Component Value Date   WBC 11.9 (H) 07/15/2017   HGB 10.2 (L) 07/15/2017   HCT 32.2 (L) 07/15/2017   MCV 85.6 07/15/2017   PLT 236 07/15/2017    Recent Labs Lab 07/14/17 1719  07/17/17 0210  NA 134*  < > 138  K 5.2*  < > 3.8  CL 103  < > 106  CO2 20*  < > 22  BUN 54*  < > 29*  CREATININE 1.72*  < > 0.87  CALCIUM 8.7*  < > 8.6*  PROT 5.9*  --   --   BILITOT 0.6  --   --   ALKPHOS 125  --   --   ALT 30  --   --  AST 36  --   --   GLUCOSE 126*  < > 109*  < > = values in this interval not displayed.  Discharge Medications:  Allergies as of 07/19/2017      Reactions   Sulfur Swelling, Rash      Medication List    TAKE these medications   acetaminophen 500 MG tablet Commonly known as:  TYLENOL Take 1,000 mg by mouth 3 (three) times daily. Max of 3 grams acetaminophen per 24 hrs from all sources.   ANORO ELLIPTA 62.5-25 MCG/INH Aepb Generic drug:  umeclidinium-vilanterol Inhale 1 Inhaler into the lungs daily.   apixaban 2.5 MG Tabs tablet Commonly known as:  ELIQUIS Take 1 tablet (2.5 mg total) by mouth 2 (two) times daily. Resume 07/20/17 What changed:  additional instructions   atorvastatin 80 MG tablet Commonly known as:  LIPITOR Take 1 tablet (80 mg total) by mouth daily at 6 PM.   cycloSPORINE 0.05 % ophthalmic emulsion Commonly known as:   RESTASIS Place 1 drop into both eyes every 12 (twelve) hours.   diltiazem 180 MG 24 hr capsule Commonly known as:  CARDIZEM CD TAKE ONE CAPSULE BY MOUTH ONCE DAILY. What changed:  See the new instructions.   furosemide 20 MG tablet Commonly known as:  LASIX Take 3 tablets (60 mg total) by mouth daily.   gabapentin 100 MG capsule Commonly known as:  NEURONTIN Take 1 capsule (100 mg total) by mouth 3 (three) times daily. What changed:  how much to take  when to take this   KP FERROUS SULFATE 325 (65 FE) MG tablet Generic drug:  ferrous sulfate Take 325 mg by mouth daily with breakfast.   multivitamin with minerals Tabs tablet Take 1 tablet by mouth every other day.   omeprazole 20 MG capsule Commonly known as:  PRILOSEC Take 20 mg by mouth daily.   OXYGEN Inhale 2 L into the lungs 2 (two) times daily. 3:15pm to 11:15pm 11:15pm to 07:15am   polyethylene glycol packet Commonly known as:  MIRALAX / GLYCOLAX Take 17 g by mouth daily. Hold for diarrhea   potassium chloride 10 MEQ tablet Commonly known as:  K-DUR,KLOR-CON Take 20 mEq by mouth daily.   PROAIR HFA 108 (90 Base) MCG/ACT inhaler Generic drug:  albuterol Inhale 2 puffs into the lungs 3 (three) times daily as needed for wheezing (cough).   promethazine 12.5 MG tablet Commonly known as:  PHENERGAN Take 12.5 mg by mouth every 6 (six) hours as needed for nausea or vomiting.   SYSTANE OP Place 1 drop into both eyes 3 (three) times daily. For dry eyes   traMADol 50 MG tablet Commonly known as:  ULTRAM Take 50 mg by mouth 2 (two) times daily.       Disposition: Her SNF Discharge Instructions    Diet - low sodium heart healthy    Complete by:  As directed    Increase activity slowly    Complete by:  As directed      Follow-up Information    [place] [facility] up.   Specialty:  Cardiology Contact information: [address], [address]  [place]       Deboraha Sprang, MD Follow up.   Specialty:  Cardiology Contact information: [address] [address] [phone]           Duration of Discharge Encounter: Greater than 30 minutes including physician time.  Signed,  Chanetta Marshall, NP 07/19/2017 10:30 AM

## 2017-07-17 NOTE — Progress Notes (Signed)
Patient transported to CT via bed with Beverely Low NT. Patient on cardiac monitoring. Vital signs stable throughout on room air. Patient tolerated procedure well and was then transported back to [place]. Will continue to monitor patient.   Levon Hedger, RN

## 2017-07-17 NOTE — Progress Notes (Signed)
Paged Dr Radford Pax regarding patient crying out in pain about neck/upper back. Dr Radford Pax to put in orders. Will continue to monitor patient.  Levon Hedger, RN

## 2017-07-17 NOTE — Progress Notes (Addendum)
Progress Note  Patient Name: Wendy Perkins Date of Encounter: 07/17/2017  Primary Cardiologist: Dr. Domenic Polite  Subjective   No CP or SOB, has neck pain that is spasm/ralated worsened with movement pt/daughter suspect unusual bed/pillow as etiology  Inpatient Medications    Scheduled Meds: . atorvastatin  80 mg Oral q1800  . cycloSPORINE  1 drop Both Eyes Q12H  . ferrous sulfate  325 mg Oral Q breakfast  . furosemide  40 mg Intravenous Daily  . gabapentin  200 mg Oral BID  . gentamicin irrigation  80 mg Irrigation To Cath  . iopamidol      . mouth rinse  15 mL Mouth Rinse BID  . multivitamin with minerals  1 tablet Oral QODAY  . pantoprazole  40 mg Oral Daily  . polyethylene glycol  17 g Oral Daily  . polyvinyl alcohol   Both Eyes TID  . sodium chloride flush  3 mL Intravenous Q12H  . umeclidinium-vilanterol  1 puff Inhalation Daily   Continuous Infusions: . sodium chloride 50 mL/hr at 07/17/17 1000  . sodium chloride 50 mL/hr at 07/17/17 1000  .  ceFAZolin (ANCEF) IV     PRN Meds: acetaminophen, albuterol, diclofenac sodium, traMADol   Vital Signs    Vitals:   07/17/17 0800 07/17/17 0850 07/17/17 0900 07/17/17 1000  BP: (!) 162/82  (!) 173/60 (!) 133/118  Pulse: (!) 48  (!) 103 85  Resp: 17  14 (!) 29  Temp:      TempSrc:      SpO2: 92% 99% 98% 99%  Weight:      Height:        Intake/Output Summary (Last 24 hours) at 07/17/17 1048 Last data filed at 07/17/17 1000  Gross per 24 hour  Intake              680 ml  Output             6500 ml  Net            -5820 ml   Filed Weights   07/14/17 2045 07/16/17 0330 07/17/17 0500  Weight: 151 lb 14.4 oz (68.9 kg) 150 lb 2.1 oz (68.1 kg) 136 lb 7.4 oz (61.9 kg)    Telemetry    AFib, 70's, ocC 4-5  second pauses - Personally Reviewed ALSO w widecomplex escape rhythm consistent with CHB and ventricular source   ECG    No new EKGs - Personally Reviewed  Physical Exam   GEN: No acute distress, though  having intermittent "spasms" of neck pain   Neck: No JVD Cardiac: IRRR, no murmurs, rubs, or gallops.  Respiratory: Clear to auscultation bilaterally. GI: Soft, nontender MS: No edema; No deformity, thin body habitus, age appropriate atrophy Neuro:  Nonfocal  Psych: Normal affect   Labs    Chemistry Recent Labs Lab 07/14/17 1719  07/15/17 0805 07/16/17 0145 07/17/17 0210  NA 134*  < > 134* 133* 138  K 5.2*  < > 5.5* 4.7 3.8  CL 103  < > 103 102 106  CO2 20*  --  19* 20* 22  GLUCOSE 126*  < > 94 107* 109*  BUN 54*  < > 53* 47* 29*  CREATININE 1.72*  < > 1.52* 1.35* 0.87  CALCIUM 8.7*  --  8.7* 8.4* 8.6*  PROT 5.9*  --   --   --   --   ALBUMIN 3.2*  --   --   --   --  AST 36  --   --   --   --   ALT 30  --   --   --   --   ALKPHOS 125  --   --   --   --   BILITOT 0.6  --   --   --   --   GFRNONAA 24*  --  28* 32* 54*  GFRAA 28*  --  32* 37* >60  ANIONGAP 11  --  12 11 10   < > = values in this interval not displayed.   Hematology Recent Labs Lab 07/11/17 0615 07/14/17 1719 07/14/17 1742 07/15/17 0805  WBC 10.5 11.3*  --  11.9*  RBC 3.76* 3.60*  --  3.76*  HGB 10.2* 10.1* 10.9* 10.2*  HCT 33.3* 31.8* 32.0* 32.2*  MCV 88.6 88.3  --  85.6  MCH 27.1 28.1  --  27.1  MCHC 30.6 31.8  --  31.7  RDW 14.1 14.9  --  15.1  PLT 280 258  --  236    Cardiac Enzymes Recent Labs Lab 07/14/17 1736  TROPONINI 0.06*   No results for input(s): TROPIPOC in the last 168 hours.   BNP Recent Labs Lab 07/14/17 1736  BNP 542.0*     DDimer No results for input(s): DDIMER in the last 168 hours.   Radiology    Ct Soft Tissue Neck Wo Contrast Result Date: 07/17/2017 CLINICAL DATA:  LEFT posterior neck pain beginning last night. History of carotid artery disease. EXAM: CT NECK WITHOUT CONTRAST TECHNIQUE: Multidetector CT imaging of the neck was performed following the standard protocol without intravenous contrast. COMPARISON:  CT angiogram of the head and neck December 06, 2016 FINDINGS: PHARYNX AND LARYNX: Normal.  Widely patent airway. SALIVARY GLANDS: Normal. THYROID: Normal. LYMPH NODES: No lymphadenopathy by CT size criteria though, sensitivity decreased without contrast. VASCULAR: Moderate calcific atherosclerosis carotid bifurcations. LIMITED INTRACRANIAL: Normal for age. VISUALIZED ORBITS: Normal. MASTOIDS AND VISUALIZED PARANASAL SINUSES: Well-aerated. SKELETON: Nonacute. C4-5, C5-6 auto interbody arthrodesis. Unchanged cyst 8 mm calcified pannus posterior to the odontoid process consistent with CPPD. Stable moderate C3-4 central disc protrusion. Multilevel severe neural foraminal narrowing. UPPER CHEST: Lung apices are clear. No superior mediastinal lymphadenopathy. OTHER: None. IMPRESSION: 1. No acute process in the neck. No specific findings to explain new LEFT neck pain. 2. Degenerative change of the cervical spine resulting in multilevel severe neural foraminal narrowing. Electronically Signed   By: Elon Alas M.D.   On: 07/17/2017 03:31    Cardiac Studies   07/09/17: TTE Study Conclusions - Left ventricle: The cavity size was normal. Wall thickness was   increased in a pattern of mild LVH. Systolic function was normal.   The estimated ejection fraction was in the range of 60% to 65%.   Wall motion was normal; there were no regional wall motion   abnormalities. - Aortic valve: Mildly calcified annulus. Trileaflet; mildly   thickened leaflets. - Mitral valve: Moderately calcified annulus. Moderately thickened   leaflets . The findings are consistent with mild to moderate   stenosis. There was mild regurgitation. Pressure half-time: 150   ms. Mean gradient (D): 4 mm Hg. Valve area by pressure half-time:   1.47 cm^2. - Left atrium: The atrium was severely dilated. - Right ventricle: The cavity size was mildly dilated. - Right atrium: The atrium was severely dilated. - Pulmonary arteries: Systolic pressure was moderately increased.   PA peak  pressure: 47 mm Hg (S). - Technically adequate study.  Patient Profile     83 y.o. female history of permanent atrial fibrillation, diastolic congestive heart failure admitted with complete heart block, rates 30s-40's home cardizem held with improvement of HR in general though continued to have pauses 3-4 seconds, is planned for PPM today with Dr. Caryl Comes  Assessment & Plan    1.  Symptomatic bradycardia The patient has symptomatic bradycardia in the setting of permanent atrial fibrillation.   Her AVN blocking agents have been held with improvement in HR overall, though pauses intermittently 3-4 seconds Planned for PPM today, risks/benefits were discussed yesterday by Dr. Rayann Heman, they have no follow up questions today and would like to proceed Eliquis held yesterday  2.  Permanent atrial fibrillation Plans to resume Eliquis long term for CHADS2VASC of 4 post pacer Timing pending wound  3.  HTN Avoid AVN blocking agents until PPM in place Could use Hydralazine if needed  4. Neck pain     No acute abnormal findings on CT     No reports of trauma     No obvious physical abnormalities, skin changes, worse with movement     Suspect muscular     Agree with Voltaren, will try warm pad   Signed, Baldwin Jamaica, PA-C  07/17/2017, 10:48 AM    Have discussed with Dr Greggory Brandy given the increase in the resting rate,  There ahave still been long pauses with inter CHB so will proceed with single chamber pacing

## 2017-07-17 NOTE — Plan of Care (Signed)
Problem: Nutrition: Goal: Adequate nutrition will be maintained Outcome: Progressing Progressing diet as tolerated. Now on clear liquids.

## 2017-07-18 ENCOUNTER — Ambulatory Visit (HOSPITAL_COMMUNITY): Payer: Medicare Other

## 2017-07-18 DIAGNOSIS — Z66 Do not resuscitate: Secondary | ICD-10-CM | POA: Diagnosis not present

## 2017-07-18 DIAGNOSIS — K219 Gastro-esophageal reflux disease without esophagitis: Secondary | ICD-10-CM | POA: Diagnosis not present

## 2017-07-18 DIAGNOSIS — M1990 Unspecified osteoarthritis, unspecified site: Secondary | ICD-10-CM | POA: Diagnosis not present

## 2017-07-18 DIAGNOSIS — Z7901 Long term (current) use of anticoagulants: Secondary | ICD-10-CM | POA: Diagnosis not present

## 2017-07-18 DIAGNOSIS — J449 Chronic obstructive pulmonary disease, unspecified: Secondary | ICD-10-CM | POA: Diagnosis not present

## 2017-07-18 DIAGNOSIS — D649 Anemia, unspecified: Secondary | ICD-10-CM | POA: Diagnosis present

## 2017-07-18 DIAGNOSIS — I951 Orthostatic hypotension: Secondary | ICD-10-CM | POA: Diagnosis not present

## 2017-07-18 DIAGNOSIS — I69320 Aphasia following cerebral infarction: Secondary | ICD-10-CM | POA: Diagnosis not present

## 2017-07-18 DIAGNOSIS — R278 Other lack of coordination: Secondary | ICD-10-CM | POA: Diagnosis not present

## 2017-07-18 DIAGNOSIS — Z8249 Family history of ischemic heart disease and other diseases of the circulatory system: Secondary | ICD-10-CM | POA: Diagnosis not present

## 2017-07-18 DIAGNOSIS — N179 Acute kidney failure, unspecified: Secondary | ICD-10-CM | POA: Diagnosis not present

## 2017-07-18 DIAGNOSIS — I5033 Acute on chronic diastolic (congestive) heart failure: Secondary | ICD-10-CM | POA: Diagnosis not present

## 2017-07-18 DIAGNOSIS — Z823 Family history of stroke: Secondary | ICD-10-CM | POA: Diagnosis not present

## 2017-07-18 DIAGNOSIS — Z9981 Dependence on supplemental oxygen: Secondary | ICD-10-CM | POA: Diagnosis not present

## 2017-07-18 DIAGNOSIS — Z993 Dependence on wheelchair: Secondary | ICD-10-CM | POA: Diagnosis not present

## 2017-07-18 DIAGNOSIS — I2510 Atherosclerotic heart disease of native coronary artery without angina pectoris: Secondary | ICD-10-CM | POA: Diagnosis not present

## 2017-07-18 DIAGNOSIS — I482 Chronic atrial fibrillation: Secondary | ICD-10-CM | POA: Diagnosis not present

## 2017-07-18 DIAGNOSIS — I499 Cardiac arrhythmia, unspecified: Secondary | ICD-10-CM | POA: Diagnosis not present

## 2017-07-18 DIAGNOSIS — Z5189 Encounter for other specified aftercare: Secondary | ICD-10-CM | POA: Diagnosis not present

## 2017-07-18 DIAGNOSIS — Z882 Allergy status to sulfonamides status: Secondary | ICD-10-CM | POA: Diagnosis not present

## 2017-07-18 DIAGNOSIS — J302 Other seasonal allergic rhinitis: Secondary | ICD-10-CM | POA: Diagnosis not present

## 2017-07-18 DIAGNOSIS — Z950 Presence of cardiac pacemaker: Secondary | ICD-10-CM | POA: Diagnosis not present

## 2017-07-18 DIAGNOSIS — R1312 Dysphagia, oropharyngeal phase: Secondary | ICD-10-CM | POA: Diagnosis not present

## 2017-07-18 DIAGNOSIS — E785 Hyperlipidemia, unspecified: Secondary | ICD-10-CM | POA: Diagnosis not present

## 2017-07-18 DIAGNOSIS — I130 Hypertensive heart and chronic kidney disease with heart failure and stage 1 through stage 4 chronic kidney disease, or unspecified chronic kidney disease: Secondary | ICD-10-CM | POA: Diagnosis not present

## 2017-07-18 DIAGNOSIS — I5032 Chronic diastolic (congestive) heart failure: Secondary | ICD-10-CM | POA: Diagnosis not present

## 2017-07-18 DIAGNOSIS — Z9181 History of falling: Secondary | ICD-10-CM | POA: Diagnosis not present

## 2017-07-18 DIAGNOSIS — M6281 Muscle weakness (generalized): Secondary | ICD-10-CM | POA: Diagnosis not present

## 2017-07-18 DIAGNOSIS — I442 Atrioventricular block, complete: Secondary | ICD-10-CM | POA: Diagnosis not present

## 2017-07-18 DIAGNOSIS — I10 Essential (primary) hypertension: Secondary | ICD-10-CM | POA: Diagnosis not present

## 2017-07-18 DIAGNOSIS — Z7401 Bed confinement status: Secondary | ICD-10-CM | POA: Diagnosis not present

## 2017-07-18 DIAGNOSIS — N183 Chronic kidney disease, stage 3 (moderate): Secondary | ICD-10-CM | POA: Diagnosis present

## 2017-07-18 DIAGNOSIS — I63312 Cerebral infarction due to thrombosis of left middle cerebral artery: Secondary | ICD-10-CM | POA: Diagnosis not present

## 2017-07-18 DIAGNOSIS — G8929 Other chronic pain: Secondary | ICD-10-CM | POA: Diagnosis present

## 2017-07-18 DIAGNOSIS — T502X5A Adverse effect of carbonic-anhydrase inhibitors, benzothiadiazides and other diuretics, initial encounter: Secondary | ICD-10-CM | POA: Diagnosis present

## 2017-07-18 DIAGNOSIS — M542 Cervicalgia: Secondary | ICD-10-CM | POA: Diagnosis present

## 2017-07-18 DIAGNOSIS — M25561 Pain in right knee: Secondary | ICD-10-CM | POA: Diagnosis not present

## 2017-07-18 MED ORDER — GABAPENTIN 100 MG PO CAPS
200.0000 mg | ORAL_CAPSULE | Freq: Two times a day (BID) | ORAL | Status: DC
  Administered 2017-07-18 – 2017-07-19 (×4): 200 mg via ORAL
  Filled 2017-07-18 (×4): qty 2

## 2017-07-18 MED ORDER — POLYVINYL ALCOHOL 1.4 % OP SOLN
Freq: Three times a day (TID) | OPHTHALMIC | Status: DC
  Administered 2017-07-18 – 2017-07-19 (×2): via OPHTHALMIC
  Filled 2017-07-18: qty 15

## 2017-07-18 MED ORDER — CYCLOSPORINE 0.05 % OP EMUL
1.0000 [drp] | Freq: Two times a day (BID) | OPHTHALMIC | Status: DC
  Administered 2017-07-18 – 2017-07-19 (×2): 1 [drp] via OPHTHALMIC
  Filled 2017-07-18 (×3): qty 1

## 2017-07-18 MED ORDER — DICLOFENAC SODIUM 1 % TD GEL
2.0000 g | Freq: Two times a day (BID) | TRANSDERMAL | Status: DC | PRN
  Administered 2017-07-19: 2 g via TOPICAL
  Filled 2017-07-18 (×2): qty 100

## 2017-07-18 MED ORDER — DILTIAZEM HCL ER COATED BEADS 180 MG PO CP24
180.0000 mg | ORAL_CAPSULE | Freq: Every day | ORAL | Status: DC
  Administered 2017-07-18 – 2017-07-19 (×2): 180 mg via ORAL
  Filled 2017-07-18 (×2): qty 1

## 2017-07-18 NOTE — Progress Notes (Signed)
Report given to [place] at this time.  Pt has no s/s of any acute distress.  Daughter at bedside and aware.

## 2017-07-18 NOTE — Progress Notes (Signed)
Notified dietary pt did not get a breakfast tray

## 2017-07-18 NOTE — Discharge Instructions (Signed)
° ° °  Supplemental Discharge Instructions for  Pacemaker/Defibrillator Patients  Activity No heavy lifting or vigorous activity with your left/right arm for 6 to 8 weeks.  Do not raise your left/right arm above your head for one week.  Gradually raise your affected arm as drawn below.              07/21/17                    07/22/17                    07/23/17                  07/24/17 __  NO DRIVING the patient does not drive  WOUND CARE - Keep the wound area clean and dry.  Do not get this area wet, no showers for24 hours; you may shower on  07/24/17  . - The tape/steri-strips on your wound will fall off; do not pull them off.  No bandage is needed on the site.  DO  NOT apply any creams, oils, or ointments to the wound area. - If you notice any drainage or discharge from the wound, any swelling or bruising at the site, or you develop a fever > 101? F after you are discharged home, call the office at once.  Special Instructions - You are still able to use cellular telephones; use the ear opposite the side where you have your pacemaker/defibrillator.  Avoid carrying your cellular phone near your device. - When traveling through airports, show security personnel your identification card to avoid being screened in the metal detectors.  Ask the security personnel to use the hand wand. - Avoid arc welding equipment, MRI testing (magnetic resonance imaging), TENS units (transcutaneous nerve stimulators).  Call the office for questions about other devices. - Avoid electrical appliances that are in poor condition or are not properly grounded. - Microwave ovens are safe to be near or to operate.

## 2017-07-18 NOTE — Progress Notes (Signed)
Cards fellow text-paged d/t pt's ongoing neck/shoulder pain not resolved with PRN tylenol. Pt had voltaren ointment and scheduled gabapentin during day, but when transfer orders were released they were d/c'd.   Awaiting response.

## 2017-07-18 NOTE — Progress Notes (Signed)
Progress Note  Patient Name: Wendy Perkins Date of Encounter: 07/18/2017  Primary Cardiologist: Dr. Domenic Polite  Subjective   Resting comfortably, easily woken.   She denies complaints, no active neck pain this morning, minimal discomfort at pacer site, no CP or SOB.  Inpatient Medications    Scheduled Meds: . gabapentin  200 mg Oral BID   Continuous Infusions:  PRN Meds: acetaminophen, diclofenac sodium, ondansetron (ZOFRAN) IV   Vital Signs    Vitals:   07/18/17 0800 07/18/17 0858 07/18/17 0900 07/18/17 1000  BP: 123/76 123/76 (!) 150/102   Pulse: 86 66 82 95  Resp: (!) 25 (!) 21 14 17   Temp:  98.7 F (37.1 C)    TempSrc:  Oral    SpO2: 97% 100% 100% 100%  Weight:      Height:        Intake/Output Summary (Last 24 hours) at 07/18/17 1031 Last data filed at 07/18/17 1000  Gross per 24 hour  Intake              730 ml  Output             3270 ml  Net            -2540 ml   Filed Weights   07/14/17 2045 07/16/17 0330 07/17/17 0500  Weight: 151 lb 14.4 oz (68.9 kg) 150 lb 2.1 oz (68.1 kg) 136 lb 7.4 oz (61.9 kg)    Telemetry    AFib, 70's, personally reviewed  ECG    AFib, 80bpm - Personally Reviewed  Physical Exam   GEN: No acute distress   Neck: No JVD Cardiac: IRRR, no murmurs, rubs, or gallops.  Respiratory: Clear to auscultation bilaterally. GI: Soft, nontender MS: No edema; No deformity, thin body habitus, age appropriate atrophy Neuro:  Nonfocal  Psych: Normal affect   PPM site is dry, no hematoma, ecchymosis  Labs    Chemistry Recent Labs Lab 07/14/17 1719  07/15/17 0805 07/16/17 0145 07/17/17 0210  NA 134*  < > 134* 133* 138  K 5.2*  < > 5.5* 4.7 3.8  CL 103  < > 103 102 106  CO2 20*  --  19* 20* 22  GLUCOSE 126*  < > 94 107* 109*  BUN 54*  < > 53* 47* 29*  CREATININE 1.72*  < > 1.52* 1.35* 0.87  CALCIUM 8.7*  --  8.7* 8.4* 8.6*  PROT 5.9*  --   --   --   --   ALBUMIN 3.2*  --   --   --   --   AST 36  --   --   --   --     ALT 30  --   --   --   --   ALKPHOS 125  --   --   --   --   BILITOT 0.6  --   --   --   --   GFRNONAA 24*  --  28* 32* 54*  GFRAA 28*  --  32* 37* >60  ANIONGAP 11  --  12 11 10   < > = values in this interval not displayed.   Hematology  Recent Labs Lab 07/14/17 1719 07/14/17 1742 07/15/17 0805  WBC 11.3*  --  11.9*  RBC 3.60*  --  3.76*  HGB 10.1* 10.9* 10.2*  HCT 31.8* 32.0* 32.2*  MCV 88.3  --  85.6  MCH 28.1  --  27.1  MCHC 31.8  --  31.7  RDW 14.9  --  15.1  PLT 258  --  236    Cardiac Enzymes  Recent Labs Lab 07/14/17 1736  TROPONINI 0.06*   No results for input(s): TROPIPOC in the last 168 hours.   BNP  Recent Labs Lab 07/14/17 1736  BNP 542.0*     DDimer No results for input(s): DDIMER in the last 168 hours.   Radiology    07/18/17: CXR IMPRESSION: Pacemaker with lead tip attached to the middle cardiac vein. Evidence of a degree of congestive heart failure. There is aortic atherosclerosis. No appreciable airspace consolidation.  Ct Soft Tissue Neck Wo Contrast Result Date: 07/17/2017 CLINICAL DATA:  LEFT posterior neck pain beginning last night. History of carotid artery disease. EXAM: CT NECK WITHOUT CONTRAST TECHNIQUE: Multidetector CT imaging of the neck was performed following the standard protocol without intravenous contrast. COMPARISON:  CT angiogram of the head and neck December 06, 2016 FINDINGS: PHARYNX AND LARYNX: Normal.  Widely patent airway. SALIVARY GLANDS: Normal. THYROID: Normal. LYMPH NODES: No lymphadenopathy by CT size criteria though, sensitivity decreased without contrast. VASCULAR: Moderate calcific atherosclerosis carotid bifurcations. LIMITED INTRACRANIAL: Normal for age. VISUALIZED ORBITS: Normal. MASTOIDS AND VISUALIZED PARANASAL SINUSES: Well-aerated. SKELETON: Nonacute. C4-5, C5-6 auto interbody arthrodesis. Unchanged cyst 8 mm calcified pannus posterior to the odontoid process consistent with CPPD. Stable moderate C3-4 central  disc protrusion. Multilevel severe neural foraminal narrowing. UPPER CHEST: Lung apices are clear. No superior mediastinal lymphadenopathy. OTHER: None. IMPRESSION: 1. No acute process in the neck. No specific findings to explain new LEFT neck pain. 2. Degenerative change of the cervical spine resulting in multilevel severe neural foraminal narrowing. Electronically Signed   By: Elon Alas M.D.   On: 07/17/2017 03:31    Cardiac Studies   07/09/17: TTE Study Conclusions - Left ventricle: The cavity size was normal. Wall thickness was   increased in a pattern of mild LVH. Systolic function was normal.   The estimated ejection fraction was in the range of 60% to 65%.   Wall motion was normal; there were no regional wall motion   abnormalities. - Aortic valve: Mildly calcified annulus. Trileaflet; mildly   thickened leaflets. - Mitral valve: Moderately calcified annulus. Moderately thickened   leaflets . The findings are consistent with mild to moderate   stenosis. There was mild regurgitation. Pressure half-time: 150   ms. Mean gradient (D): 4 mm Hg. Valve area by pressure half-time:   1.47 cm^2. - Left atrium: The atrium was severely dilated. - Right ventricle: The cavity size was mildly dilated. - Right atrium: The atrium was severely dilated. - Pulmonary arteries: Systolic pressure was moderately increased.   PA peak pressure: 47 mm Hg (S). - Technically adequate study.  Patient Profile     83 y.o. female history of permanent atrial fibrillation, diastolic congestive heart failure admitted with complete heart block, rates 30s-40's home cardizem held with improvement of HR in general though continued to have pauses 3-4 seconds, is s/p PPM yesterday with Dr. Caryl Comes  Assessment & Plan    1.  Symptomatic bradycardia Now s/p PPM implant Site is stable device check this morning noted stable measurements and intact function CXR this morning is without ptx, ?CHF  The patient is seen  by Dr. Caryl Comes Will transfer to telemetry, ask PT to see her.  At baseline daughter reported she is non-ambulatory, but able to transfer in/out of bed, will ask them to get started with her   Anticipate discharge tomorrow  The  patient has been OOB to chair, back in bed eating, and feeling well, she denies any SOB, exam does not suggest overt fluid OL  2.  Permanent atrial fibrillation Plans to resume Eliquis long term for CHADS2VASC of 4 post pacer +/- 72 hours  3.  HTN      Resume her home diltiazem  4. Neck pain     Much better today   Signed, Baldwin Jamaica, PA-C  07/18/2017, 10:31 AM     Seen and examined  Will ambulate today and get PT to help evaluate prior to discharge  Will resume apixoban in aobut 72 hrs to decrease reisk for pocket bleeding

## 2017-07-19 ENCOUNTER — Encounter (HOSPITAL_COMMUNITY): Payer: Self-pay | Admitting: Internal Medicine

## 2017-07-19 ENCOUNTER — Inpatient Hospital Stay
Admission: RE | Admit: 2017-07-19 | Discharge: 2017-09-20 | Disposition: A | Discharge status: Nursing Home (Includes ICF) | Payer: Medicare Other | Source: Ambulatory Visit | Attending: Internal Medicine | Admitting: Internal Medicine

## 2017-07-19 DIAGNOSIS — I69320 Aphasia following cerebral infarction: Secondary | ICD-10-CM | POA: Diagnosis not present

## 2017-07-19 DIAGNOSIS — R278 Other lack of coordination: Secondary | ICD-10-CM | POA: Diagnosis not present

## 2017-07-19 DIAGNOSIS — J449 Chronic obstructive pulmonary disease, unspecified: Secondary | ICD-10-CM | POA: Diagnosis not present

## 2017-07-19 DIAGNOSIS — I10 Essential (primary) hypertension: Secondary | ICD-10-CM | POA: Diagnosis not present

## 2017-07-19 DIAGNOSIS — K219 Gastro-esophageal reflux disease without esophagitis: Secondary | ICD-10-CM | POA: Diagnosis not present

## 2017-07-19 DIAGNOSIS — R1312 Dysphagia, oropharyngeal phase: Secondary | ICD-10-CM | POA: Diagnosis not present

## 2017-07-19 DIAGNOSIS — H34811 Central retinal vein occlusion, right eye, with macular edema: Secondary | ICD-10-CM | POA: Diagnosis not present

## 2017-07-19 DIAGNOSIS — N390 Urinary tract infection, site not specified: Secondary | ICD-10-CM | POA: Diagnosis not present

## 2017-07-19 DIAGNOSIS — Z950 Presence of cardiac pacemaker: Secondary | ICD-10-CM | POA: Diagnosis not present

## 2017-07-19 DIAGNOSIS — K625 Hemorrhage of anus and rectum: Secondary | ICD-10-CM | POA: Diagnosis not present

## 2017-07-19 DIAGNOSIS — G4700 Insomnia, unspecified: Secondary | ICD-10-CM | POA: Diagnosis not present

## 2017-07-19 DIAGNOSIS — M1990 Unspecified osteoarthritis, unspecified site: Secondary | ICD-10-CM | POA: Diagnosis not present

## 2017-07-19 DIAGNOSIS — I442 Atrioventricular block, complete: Secondary | ICD-10-CM | POA: Diagnosis not present

## 2017-07-19 DIAGNOSIS — I2510 Atherosclerotic heart disease of native coronary artery without angina pectoris: Secondary | ICD-10-CM | POA: Diagnosis not present

## 2017-07-19 DIAGNOSIS — Z9981 Dependence on supplemental oxygen: Secondary | ICD-10-CM | POA: Diagnosis not present

## 2017-07-19 DIAGNOSIS — I5033 Acute on chronic diastolic (congestive) heart failure: Secondary | ICD-10-CM | POA: Diagnosis not present

## 2017-07-19 DIAGNOSIS — E785 Hyperlipidemia, unspecified: Secondary | ICD-10-CM | POA: Diagnosis not present

## 2017-07-19 DIAGNOSIS — I63312 Cerebral infarction due to thrombosis of left middle cerebral artery: Secondary | ICD-10-CM | POA: Diagnosis not present

## 2017-07-19 DIAGNOSIS — I951 Orthostatic hypotension: Secondary | ICD-10-CM | POA: Diagnosis not present

## 2017-07-19 DIAGNOSIS — M6281 Muscle weakness (generalized): Secondary | ICD-10-CM | POA: Diagnosis not present

## 2017-07-19 DIAGNOSIS — I482 Chronic atrial fibrillation: Secondary | ICD-10-CM | POA: Diagnosis not present

## 2017-07-19 DIAGNOSIS — I480 Paroxysmal atrial fibrillation: Secondary | ICD-10-CM | POA: Diagnosis not present

## 2017-07-19 DIAGNOSIS — J302 Other seasonal allergic rhinitis: Secondary | ICD-10-CM | POA: Diagnosis not present

## 2017-07-19 DIAGNOSIS — Z9181 History of falling: Secondary | ICD-10-CM | POA: Diagnosis not present

## 2017-07-19 DIAGNOSIS — I639 Cerebral infarction, unspecified: Secondary | ICD-10-CM | POA: Diagnosis not present

## 2017-07-19 DIAGNOSIS — Z5189 Encounter for other specified aftercare: Secondary | ICD-10-CM | POA: Diagnosis not present

## 2017-07-19 DIAGNOSIS — I5032 Chronic diastolic (congestive) heart failure: Secondary | ICD-10-CM | POA: Diagnosis not present

## 2017-07-19 DIAGNOSIS — M545 Low back pain: Secondary | ICD-10-CM | POA: Diagnosis not present

## 2017-07-19 DIAGNOSIS — Z7901 Long term (current) use of anticoagulants: Secondary | ICD-10-CM | POA: Diagnosis not present

## 2017-07-19 DIAGNOSIS — M25561 Pain in right knee: Secondary | ICD-10-CM | POA: Diagnosis not present

## 2017-07-19 MED ORDER — APIXABAN 2.5 MG PO TABS: 2.5000 mg | ORAL_TABLET | Freq: Two times a day (BID) | ORAL | Status: AC

## 2017-07-19 NOTE — Progress Notes (Signed)
Attempted to call report x2

## 2017-07-19 NOTE — NC FL2 (Signed)
[place] LEVEL OF CARE SCREENING TOOL     IDENTIFICATION  Patient Name: Wendy Perkins Birthdate: 1919/08/12 Sex: female Admission Date (Current Location): 07/14/2017  [facility] and [place] Number:  [employer] and Address:  [facility], [place] [address], [place], [place] [address]      Provider Number: [id number]  Attending Physician Name and Address:  Deboraha Sprang, MD  Relative Name and Phone Number:  Clance Boll, son, [phone]    Current Level of Care: Hospital Recommended Level of Care: [place] Prior Approval Number:    Date Approved/Denied:   PASRR Number: [id number]  Discharge Plan: SNF    Current Diagnoses: Patient Active Problem List   Diagnosis Date Noted  . Sinus node dysfunction ([place]) 07/17/2017  . Complete heart block ([place]) 07/14/2017  . Bradycardia with 31-40 beats per minute 07/14/2017  . AKI (acute kidney injury) ([place])   . Ventricular escape rhythm   . Acute on chronic diastolic (congestive) heart failure ([place])   . Radicular pain of thoracic region 07/10/2017  . Bilateral leg edema 07/08/2017  . Hemorrhoids 06/03/2017  . Retinal vein occlusion of right eye 05/14/2017  . Internal carotid artery stenosis, bilateral 03/07/2017  . Back pain 03/07/2017  . Aphasia 12/06/2016  . Knee pain, acute 08/06/2016  . Fall 08/06/2016  . CVA (cerebral vascular accident) ([place]) 10/17/2015  . COPD (chronic obstructive pulmonary disease) ([place]) 10/17/2015  . UTI (urinary tract infection) 10/17/2015  . Altered mental state 10/13/2015  . Elevated troponin 10/13/2015  . Shingles 05/04/2015  . Rib pain on left side 11/15/2014  . Breast pain, left 11/15/2014  . Breast nodule 11/15/2014  . Orthostatic hypotension 09/30/2013  . Atrial fibrillation ([place]) 11/23/2011  . Chronic diastolic heart failure ([place]) 11/23/2011  . Essential hypertension 07/13/2010  . DYSPNEA 11/15/2009    Orientation RESPIRATION  BLADDER Height & Weight     Self, Time, Situation, Place  Normal Continent Weight: 63.8 kg (140 lb 11.2 oz) Height:  5\' 2"  (157.5 cm)  BEHAVIORAL SYMPTOMS/MOOD NEUROLOGICAL BOWEL NUTRITION STATUS      Continent Diet (Please see DC Summary)  AMBULATORY STATUS COMMUNICATION OF NEEDS Skin   Limited Assist Verbally Surgical wounds (Closed incision on chest)                       Personal Care Assistance Level of Assistance  Bathing, Feeding, Dressing Bathing Assistance: Maximum assistance Feeding assistance: Independent Dressing Assistance: Limited assistance     Functional Limitations Info  Sight, Hearing Sight Info: Impaired Hearing Info: Impaired      SPECIAL CARE FACTORS FREQUENCY  PT (By licensed PT)     PT Frequency: 5x/week              Contractures      Additional Factors Info  Code Status, Allergies Code Status Info: Full Allergies Info: Sulfur           Current Medications (07/19/2017):  This is the current hospital active medication list Current Facility-Administered Medications  Medication Dose Route Frequency Provider Last Rate Last Dose  . acetaminophen (TYLENOL) tablet 325-650 mg  325-650 mg Oral Q4H PRN Deboraha Sprang, MD   650 mg at 07/19/17 0604  . cycloSPORINE (RESTASIS) 0.05 % ophthalmic emulsion 1 drop  1 drop Both Eyes Q12H Chakravartti, Jaidip, MD   1 drop at 07/18/17 2339  . diclofenac sodium (VOLTAREN) 1 % transdermal gel 2 g  2 g  Topical BID PRN Buford Dresser, MD   2 g at 07/19/17 0010  . diltiazem (CARDIZEM CD) 24 hr capsule 180 mg  180 mg Oral Daily Baldwin Jamaica, PA-C   180 mg at 07/19/17 4332  . gabapentin (NEURONTIN) capsule 200 mg  200 mg Oral BID Buford Dresser, MD   200 mg at 07/19/17 0948  . ondansetron (ZOFRAN) injection 4 mg  4 mg Intravenous Q6H PRN Deboraha Sprang, MD      . polyvinyl alcohol (LIQUIFILM TEARS) 1.4 % ophthalmic solution   Both Eyes TID Doylene Canning, MD         Discharge  Medications: Please see discharge summary for a list of discharge medications.  Relevant Imaging Results:  Relevant Lab Results:   Additional Information SSN: [id number]  Benard Halsted, LCSWA

## 2017-07-19 NOTE — Progress Notes (Signed)
Progress Note  Patient Name: Wendy Perkins Date of Encounter: 07/19/2017  Primary Cardiologist: Dr. Domenic Polite  Subjective   Without neck or chest pain this am No sob  Inpatient Medications    Scheduled Meds: . cycloSPORINE  1 drop Both Eyes Q12H  . diltiazem  180 mg Oral Daily  . gabapentin  200 mg Oral BID  . polyvinyl alcohol   Both Eyes TID   Continuous Infusions:  PRN Meds: acetaminophen, diclofenac sodium, ondansetron (ZOFRAN) IV   Vital Signs    Vitals:   07/18/17 1300 07/18/17 2014 07/18/17 2341 07/19/17 0402  BP: (!) 124/51 (!) 129/49 (!) 133/52 (!) 111/42  Pulse: 71 74 83 76  Resp:  18 18 18   Temp:  99.4 F (37.4 C) 99.2 F (37.3 C) 98.9 F (37.2 C)  TempSrc:  Oral Oral Oral  SpO2: 96% 97% 94% 92%  Weight: 140 lb 5 oz (63.6 kg)   140 lb 11.2 oz (63.8 kg)  Height: 5\' 2"  (1.575 m)       Intake/Output Summary (Last 24 hours) at 07/19/17 0724 Last data filed at 07/19/17 0600  Gross per 24 hour  Intake              770 ml  Output              635 ml  Net              135 ml   Filed Weights   07/17/17 0500 07/18/17 1300 07/19/17 0402  Weight: 136 lb 7.4 oz (61.9 kg) 140 lb 5 oz (63.6 kg) 140 lb 11.2 oz (63.8 kg)    Telemetry    Personally reviewed  afib  ECG     Physical Exam   Well developed and nourished in no acute distress HENT normal Neck supple   Carotids brisk and full without bruits Clear Pocket without hematoma, swelling or tenderness  Regular rate and rhythm, no murmurs or gallops Abd-soft with active BS without hepatomegaly No Clubbing cyanosis edema Skin-warm and dry A & Oriented  Grossly normal sensory and motor function   PPM site is dry, no hematoma, ecchymosis  Labs    Chemistry Recent Labs Lab 07/14/17 1719  07/15/17 0805 07/16/17 0145 07/17/17 0210  NA 134*  < > 134* 133* 138  K 5.2*  < > 5.5* 4.7 3.8  CL 103  < > 103 102 106  CO2 20*  --  19* 20* 22  GLUCOSE 126*  < > 94 107* 109*  BUN 54*  < > 53*  47* 29*  CREATININE 1.72*  < > 1.52* 1.35* 0.87  CALCIUM 8.7*  --  8.7* 8.4* 8.6*  PROT 5.9*  --   --   --   --   ALBUMIN 3.2*  --   --   --   --   AST 36  --   --   --   --   ALT 30  --   --   --   --   ALKPHOS 125  --   --   --   --   BILITOT 0.6  --   --   --   --   GFRNONAA 24*  --  28* 32* 54*  GFRAA 28*  --  32* 37* >60  ANIONGAP 11  --  12 11 10   < > = values in this interval not displayed.   Hematology  Recent Labs Lab 07/14/17 1719 07/14/17 1742  07/15/17 0805  WBC 11.3*  --  11.9*  RBC 3.60*  --  3.76*  HGB 10.1* 10.9* 10.2*  HCT 31.8* 32.0* 32.2*  MCV 88.3  --  85.6  MCH 28.1  --  27.1  MCHC 31.8  --  31.7  RDW 14.9  --  15.1  PLT 258  --  236    Cardiac Enzymes  Recent Labs Lab 07/14/17 1736  TROPONINI 0.06*   No results for input(s): TROPIPOC in the last 168 hours.   BNP  Recent Labs Lab 07/14/17 1736  BNP 542.0*     DDimer No results for input(s): DDIMER in the last 168 hours.   Radiology    07/18/17: CXR IMPRESSION: Pacemaker with lead tip attached to the middle cardiac vein. Evidence of a degree of congestive heart failure. There is aortic atherosclerosis. No appreciable airspace consolidation.  Ct Soft Tissue Neck Wo Contrast Result Date: 07/17/2017 CLINICAL DATA:  LEFT posterior neck pain beginning last night. History of carotid artery disease. EXAM: CT NECK WITHOUT CONTRAST TECHNIQUE: Multidetector CT imaging of the neck was performed following the standard protocol without intravenous contrast. COMPARISON:  CT angiogram of the head and neck December 06, 2016 FINDINGS: PHARYNX AND LARYNX: Normal.  Widely patent airway. SALIVARY GLANDS: Normal. THYROID: Normal. LYMPH NODES: No lymphadenopathy by CT size criteria though, sensitivity decreased without contrast. VASCULAR: Moderate calcific atherosclerosis carotid bifurcations. LIMITED INTRACRANIAL: Normal for age. VISUALIZED ORBITS: Normal. MASTOIDS AND VISUALIZED PARANASAL SINUSES: Well-aerated.  SKELETON: Nonacute. C4-5, C5-6 auto interbody arthrodesis. Unchanged cyst 8 mm calcified pannus posterior to the odontoid process consistent with CPPD. Stable moderate C3-4 central disc protrusion. Multilevel severe neural foraminal narrowing. UPPER CHEST: Lung apices are clear. No superior mediastinal lymphadenopathy. OTHER: None. IMPRESSION: 1. No acute process in the neck. No specific findings to explain new LEFT neck pain. 2. Degenerative change of the cervical spine resulting in multilevel severe neural foraminal narrowing. Electronically Signed   By: Elon Alas M.D.   On: 07/17/2017 03:31    Cardiac Studies   07/09/17: TTE Study Conclusions - Left ventricle: The cavity size was normal. Wall thickness was   increased in a pattern of mild LVH. Systolic function was normal.   The estimated ejection fraction was in the range of 60% to 65%.   Wall motion was normal; there were no regional wall motion   abnormalities. - Aortic valve: Mildly calcified annulus. Trileaflet; mildly   thickened leaflets. - Mitral valve: Moderately calcified annulus. Moderately thickened   leaflets . The findings are consistent with mild to moderate   stenosis. There was mild regurgitation. Pressure half-time: 150   ms. Mean gradient (D): 4 mm Hg. Valve area by pressure half-time:   1.47 cm^2. - Left atrium: The atrium was severely dilated. - Right ventricle: The cavity size was mildly dilated. - Right atrium: The atrium was severely dilated. - Pulmonary arteries: Systolic pressure was moderately increased.   PA peak pressure: 47 mm Hg (S). - Technically adequate study.  Patient Profile     83 y.o. female history of permanent atrial fibrillation, diastolic congestive heart failure admitted with complete heart block, rates 30s-40's home cardizem held with improvement of HR in general though continued to have pauses 3-4 seconds, is s/p PPM yesterday with Dr. Caryl Comes  Assessment & Plan    1.  Symptomatic  bradycardia Now s/p PPM implant Site is stable device check this morning noted stable measurements and intact function CXR this morning is without ptx, ?CHF  L  2.  Permanent atrial fibrillation Plans to resume Eliquis long term for CHADS2VASC of 4 post pacer +/- 72 hours  3.  HTN    4. Neck pain     Signed, Virl Axe, MD  07/19/2017, 7:24 AM

## 2017-07-19 NOTE — Plan of Care (Signed)
Problem: Pain Managment: Goal: General experience of comfort will improve Outcome: Progressing Continue to monitor pain using 0-10 pain scale.  Problem: Physical Regulation: Goal: Ability to maintain clinical measurements within normal limits will improve Outcome: Progressing Pt using purewick catheter.  Educated pt on benefits of use- to prevent skin breakdown due to incontinence  Problem: Skin Integrity: Goal: Risk for impaired skin integrity will decrease Outcome: Progressing Pt continues use of Purewick catheter.   Explained benefit of use by preventing skin breakdown due to incontinence

## 2017-07-19 NOTE — Clinical Social Work Note (Signed)
Clinical Social Worker facilitated patient discharge including contacting patient family and facility to confirm patient discharge plans.  Clinical information faxed to facility and family agreeable with plan.  CSW arranged ambulance transport via [facility] to [facility].  RN to call report prior to discharge.  Clinical Social Worker will sign off for now as social work intervention is no longer needed. Please consult us again if new need arises.  Jesse Anavi Branscum, LCSW [phone] 

## 2017-07-19 NOTE — Care Management Important Message (Signed)
Important Message  Patient Details  Name: Wendy Perkins MRN: [id number] Date of Birth: 1919/04/20   Medicare Important Message Given:  Yes    [name] Montine Circle 07/19/2017, 12:49 PM

## 2017-07-19 NOTE — Progress Notes (Signed)
Pt to discharge to SNF. Discharge instructions reviewed with patient and family. Attempted to call report to [facility]. Will reattempt. [facility] at bedside.

## 2017-07-19 NOTE — Evaluation (Signed)
Physical Therapy Evaluation and Discharge Patient Details Name: Wendy Perkins MRN: [id number] DOB: Apr 12, 1919 Today's Date: 07/19/2017   History of Present Illness  Pt is a 83 y/o female admitted from [facility] secondary to bradycardia. Pt is now s/p PPM on 8/8. PMH including but not limited to CHF, a-fib, HTN and HLD.  Clinical Impression  Pt presented supine in bed with HOB elevated, awake and willing to participate in therapy session. Pt's daughter present throughout session as well. Prior to admission, pt reported that she uses a w/c for mobility (mobilizing herself with her LEs) and could perform transfers independently. Pt stated that she required assistance from staff at her SNF for ADLs. Pt currently performing bed mobility with min A and transfers with min guard. Pt appears to be at her functional baseline. No further acute PT needs identified at this time. PT recommending pt continue with PT services at [facility].    Follow Up Recommendations SNF [facility])    Equipment Recommendations  None recommended by PT    Recommendations for Other Services       Precautions / Restrictions Precautions Precautions: Fall;ICD/Pacemaker Restrictions Weight Bearing Restrictions: No      Mobility  Bed Mobility Overal bed mobility: Needs Assistance Bed Mobility: Supine to Sit;Sit to Supine     Supine to sit: Min guard Sit to supine: Min assist   General bed mobility comments: increased time and effort, use of bed rails, cueing for sequencing, min A to return bilateral LEs on bed  Transfers Overall transfer level: Needs assistance Equipment used: None;Rolling walker (2 wheeled) Transfers: Sit to/from [facility] Sit to Stand: Min guard Stand pivot transfers: Min guard       General transfer comment: increased time, sit<>stand from [facility] x1 with RW, stand-pivot transfers performed without AD  Ambulation/Gait                Stairs             Wheelchair Mobility    Modified Rankin (Stroke Patients Only)       Balance Overall balance assessment: Needs assistance Sitting-balance support: Feet supported Sitting balance-Leahy Scale: Good     Standing balance support: During functional activity;Bilateral upper extremity supported Standing balance-Leahy Scale: Poor Standing balance comment: pt reliant on UE supports for balance                             Pertinent Vitals/Pain Pain Assessment: No/denies pain    Home Living Family/patient expects to be discharged to:: Skilled nursing facility                 Additional Comments: [facility]    Prior Function Level of Independence: Needs assistance   Gait / Transfers Assistance Needed: pt reported that she uses a w/c for mobility and is able to perform transfers independently  ADL's / Homemaking Assistance Needed: SNF staff assist pt with bathing and dressing        Hand Dominance   Dominant Hand: Right    Extremity/Trunk Assessment   Upper Extremity Assessment Upper Extremity Assessment: Overall WFL for tasks assessed    Lower Extremity Assessment Lower Extremity Assessment: Generalized weakness    Cervical / Trunk Assessment Cervical / Trunk Assessment: Kyphotic  Communication   Communication: HOH  Cognition Arousal/Alertness: Awake/alert Behavior During Therapy: WFL for tasks assessed/performed Overall Cognitive Status: Within Functional Limits for tasks assessed  General Comments      Exercises     Assessment/Plan    PT Assessment All further PT needs can be met in the next venue of care  PT Problem List Decreased balance;Decreased mobility;Decreased coordination;Decreased knowledge of precautions;Cardiopulmonary status limiting activity       PT Treatment Interventions      PT Goals (Current goals can be found in the Care Plan section)  Acute Rehab PT  Goals Patient Stated Goal: return to [facility]    Frequency     Barriers to discharge        Co-evaluation               AM-PAC PT "6 Clicks" Daily Activity  Outcome Measure Difficulty turning over in bed (including adjusting bedclothes, sheets and blankets)?: A Little Difficulty moving from lying on back to sitting on the side of the bed? : Total Difficulty sitting down on and standing up from a chair with arms (e.g., wheelchair, bedside commode, etc,.)?: Total Help needed moving to and from a bed to chair (including a wheelchair)?: A Little Help needed walking in hospital room?: A Little Help needed climbing 3-5 steps with a railing? : Total 6 Click Score: 12    End of Session Equipment Utilized During Treatment: Gait belt Activity Tolerance: Patient tolerated treatment well Patient left: in bed;with call bell/phone within reach;with family/visitor present Nurse Communication: Mobility status PT Visit Diagnosis: Other abnormalities of gait and mobility (R26.89)    Time: 0888-3584 PT Time Calculation (min) (ACUTE ONLY): 24 min   Charges:   PT Evaluation $PT Eval Moderate Complexity: 1 Mod PT Treatments $Therapeutic Activity: 8-22 mins   PT G Codes:        [place], PT, DPT [place] 07/19/2017, 9:15 AM

## 2017-07-20 ENCOUNTER — Encounter (HOSPITAL_COMMUNITY)
Admission: RE | Admit: 2017-07-20 | Discharge: 2017-07-20 | Disposition: A | Discharge status: Home / Self Care | Payer: Medicare Other

## 2017-07-20 DIAGNOSIS — Z5189 Encounter for other specified aftercare: Secondary | ICD-10-CM | POA: Diagnosis not present

## 2017-07-20 DIAGNOSIS — K625 Hemorrhage of anus and rectum: Secondary | ICD-10-CM | POA: Diagnosis not present

## 2017-07-20 DIAGNOSIS — M1990 Unspecified osteoarthritis, unspecified site: Secondary | ICD-10-CM | POA: Diagnosis not present

## 2017-07-20 DIAGNOSIS — I63312 Cerebral infarction due to thrombosis of left middle cerebral artery: Secondary | ICD-10-CM | POA: Diagnosis not present

## 2017-07-20 LAB — BASIC METABOLIC PANEL
ANION GAP: 9 (ref 5–15)
BUN: 36 mg/dL — ABNORMAL HIGH (ref 6–20)
CO2: 25 mmol/L (ref 22–32)
Calcium: 8.3 mg/dL — ABNORMAL LOW (ref 8.9–10.3)
Chloride: 99 mmol/L — ABNORMAL LOW (ref 101–111)
Creatinine, Ser: 0.93 mg/dL (ref 0.44–1.00)
GFR calc Af Amer: 58 mL/min — ABNORMAL LOW (ref >60)
GFR, EST NON AFRICAN AMERICAN: 50 mL/min — AB (ref >60)
Glucose, Bld: 97 mg/dL (ref 65–99)
POTASSIUM: 3.5 mmol/L (ref 3.5–5.1)
SODIUM: 133 mmol/L — AB (ref 135–145)

## 2017-07-20 LAB — CBC WITH DIFFERENTIAL/PLATELET
BASOS ABS: 0 10*3/uL (ref 0.0–0.1)
Basophils Relative: 0 %
EOS ABS: 0.1 10*3/uL (ref 0.0–0.7)
EOS PCT: 2 %
HCT: 30.9 % — ABNORMAL LOW (ref 36.0–46.0)
Hemoglobin: 10.1 g/dL — ABNORMAL LOW (ref 12.0–15.0)
LYMPHS PCT: 19 %
Lymphs Abs: 1.2 10*3/uL (ref 0.7–4.0)
MCH: 27.8 pg (ref 26.0–34.0)
MCHC: 32.7 g/dL (ref 30.0–36.0)
MCV: 85.1 fL (ref 78.0–100.0)
Monocytes Absolute: 0.9 10*3/uL (ref 0.1–1.0)
Monocytes Relative: 14 %
Neutro Abs: 4.2 10*3/uL (ref 1.7–7.7)
Neutrophils Relative %: 65 %
PLATELETS: 220 10*3/uL (ref 150–400)
RBC: 3.63 MIL/uL — AB (ref 3.87–5.11)
RDW: 15.1 % (ref 11.5–15.5)
WBC: 6.5 10*3/uL (ref 4.0–10.5)

## 2017-07-22 ENCOUNTER — Telehealth: Payer: Self-pay

## 2017-07-22 ENCOUNTER — Non-Acute Institutional Stay (SKILLED_NURSING_FACILITY): Payer: Medicare Other | Admitting: Internal Medicine

## 2017-07-22 ENCOUNTER — Encounter (HOSPITAL_COMMUNITY)
Admission: RE | Admit: 2017-07-22 | Discharge: 2017-07-22 | Disposition: A | Discharge status: Home / Self Care | Payer: Medicare Other | Source: Skilled Nursing Facility | Attending: *Deleted | Admitting: *Deleted

## 2017-07-22 ENCOUNTER — Encounter: Payer: Self-pay | Admitting: Internal Medicine

## 2017-07-22 DIAGNOSIS — G4700 Insomnia, unspecified: Secondary | ICD-10-CM

## 2017-07-22 DIAGNOSIS — M1990 Unspecified osteoarthritis, unspecified site: Secondary | ICD-10-CM | POA: Diagnosis not present

## 2017-07-22 DIAGNOSIS — K625 Hemorrhage of anus and rectum: Secondary | ICD-10-CM | POA: Diagnosis not present

## 2017-07-22 DIAGNOSIS — I5032 Chronic diastolic (congestive) heart failure: Secondary | ICD-10-CM

## 2017-07-22 DIAGNOSIS — Z5189 Encounter for other specified aftercare: Secondary | ICD-10-CM | POA: Diagnosis not present

## 2017-07-22 DIAGNOSIS — I63312 Cerebral infarction due to thrombosis of left middle cerebral artery: Secondary | ICD-10-CM | POA: Diagnosis not present

## 2017-07-22 DIAGNOSIS — I442 Atrioventricular block, complete: Secondary | ICD-10-CM | POA: Diagnosis not present

## 2017-07-22 DIAGNOSIS — I480 Paroxysmal atrial fibrillation: Secondary | ICD-10-CM

## 2017-07-22 LAB — BASIC METABOLIC PANEL
Anion gap: 7 (ref 5–15)
BUN: 25 mg/dL — ABNORMAL HIGH (ref 6–20)
CO2: 25 mmol/L (ref 22–32)
Calcium: 8.4 mg/dL — ABNORMAL LOW (ref 8.9–10.3)
Chloride: 103 mmol/L (ref 101–111)
Creatinine, Ser: 0.86 mg/dL (ref 0.44–1.00)
GFR calc Af Amer: >60 mL/min (ref >60)
GFR calc non Af Amer: 55 mL/min — ABNORMAL LOW (ref >60)
GLUCOSE: 90 mg/dL (ref 65–99)
Potassium: 4.4 mmol/L (ref 3.5–5.1)
Sodium: 135 mmol/L (ref 135–145)

## 2017-07-22 LAB — CBC WITH DIFFERENTIAL/PLATELET
Basophils Absolute: 0 10*3/uL (ref 0.0–0.1)
Basophils Relative: 0 %
Eosinophils Absolute: 0.2 10*3/uL (ref 0.0–0.7)
Eosinophils Relative: 3 %
HEMATOCRIT: 32.1 % — AB (ref 36.0–46.0)
Hemoglobin: 10.1 g/dL — ABNORMAL LOW (ref 12.0–15.0)
LYMPHS ABS: 1.7 10*3/uL (ref 0.7–4.0)
LYMPHS PCT: 25 %
MCH: 27.2 pg (ref 26.0–34.0)
MCHC: 31.5 g/dL (ref 30.0–36.0)
MCV: 86.5 fL (ref 78.0–100.0)
MONO ABS: 1 10*3/uL (ref 0.1–1.0)
MONOS PCT: 15 %
NEUTROS ABS: 3.8 10*3/uL (ref 1.7–7.7)
Neutrophils Relative %: 57 %
PLATELETS: 275 10*3/uL (ref 150–400)
RBC: 3.71 MIL/uL — ABNORMAL LOW (ref 3.87–5.11)
RDW: 15.1 % (ref 11.5–15.5)
WBC: 6.7 10*3/uL (ref 4.0–10.5)

## 2017-07-22 NOTE — Telephone Encounter (Signed)
This is a patient of [place], who was admitted to [facility] after hospitalization. [facility] F/U is needed. Hospital discharge from [facility] on 07/19/2017.

## 2017-07-22 NOTE — Progress Notes (Signed)
Provider: Veleta Miners  Location:   [facility] Room Number: 156/W Place of Service:  SNF (31)  PCP: Virgie Dad, MD Patient Care Team: Virgie Dad, MD as PCP - General (Internal Medicine) Rolm Baptise as Physician Assistant (Internal Medicine)  Extended Emergency Contact Information Primary Emergency Contact: Adin Hector, [place] [address] [name] of [place] Phone: [phone] Relation: Son Secondary Emergency Contact: [name], [place] [address] [name] of [address] Phone: [phone] Mobile Phone: [phone] Relation: Son  Code Status: DNR Goals of Care: Advanced Directive information Advanced Directives 07/22/2017  Does Patient Have a Medical Advance Directive? Yes  Type of Advance Directive Out of facility DNR (pink MOST or yellow form)  Does patient want to make changes to medical advance directive? No - Patient declined  Copy of [place] in Chart? -  Would patient like information on creating a medical advance directive? No - Patient declined  Pre-existing out of facility DNR order (yellow form or pink MOST form) -      Chief Complaint  Patient presents with  . Readmit To SNF    HPI: Patient is a 83 y.o. female seen today for Readmission to SNF for therapy and Long term Care Patient has h/o Atrial fibrillation on Eliquis, S/P Acute CVA involving left MCA ,ICA stenosis,Diastolic CHF, hyperlipidemia, And Hypertension And Arthritis with Chronic Knee and Back Pain.And recent right retinal vein occlusion.  Patient was initially admitted for CHF exacerbation and send back to facility. In the facility she again became very volume overloaded and dizzy. She was found to have Bradycardia in the ER. First her Diltiazem was held but then it was decided to proceed with Pacemaker.Patient had  Implantation of a SJM single chamber PPM on 07/17/17 by Dr Caryl Comes   patient had  some neck pain after insertion but CT scan was negative. She did require I And Out cath for few days in the hospital.  Patient is doing well in facility. She does not c/o SOB or PND. No Chest pain. No abdominal pain. She is working with therapy and actually walked with the walker.Only C/o insomnia. Both son and her daughter are in the room.  Past Medical History:  Diagnosis Date  . Atrial fibrillation ([place])   . Breast nodule 11/15/2014  . Breast pain, left 11/15/2014  . Carotid artery disease ([place])   . Dizziness   . Dyspnea    Previous CPX suggesting possible restrictive physiology, respiratory muscle fatigue, diastolic dysfunction  . Essential hypertension, benign   . Hyperlipidemia   . Oxygen dependent    2 liter  . Rib pain on left side 11/15/2014  . Seasonal allergies   . Shingles 05/04/2015  . Toe fracture, left    left big toe   Past Surgical History:  Procedure Laterality Date  . COLONOSCOPY  02/22/2012   Procedure: COLONOSCOPY;  Surgeon: Rogene Houston, MD;  [facility];  Service: Endoscopy;  Laterality: N/A;  100  . KNEE SURGERY     bilateral  . PACEMAKER IMPLANT N/A 07/17/2017   Procedure: Pacemaker Implant;  Surgeon: Deboraha Sprang, MD;  Location: [facility];  Service: Cardiovascular;  Laterality: N/A;    reports that she has never smoked. She has never used smokeless tobacco. She reports that she does not drink alcohol or use drugs. Social History  Social History  . Marital status: Widowed    Spouse name: N/A  . Number of children: N/A  . Years of education: N/A   Occupational History  . Not on file.   Social History Main Topics  . Smoking status: Never Smoker  . Smokeless tobacco: Never Used  . Alcohol use No  . Drug use: No  . Sexual activity: No   Other Topics Concern  . Not on file   Social History Narrative  . No narrative on file    Functional Status Survey:    Family History  Problem Relation Age of Onset  . Stroke  Mother   . Pneumonia Father   . Heart disease Father   . Healthy Son   . Glaucoma Daughter   . Emphysema Daughter   . Healthy Daughter   . Other Daughter        had knee replacement  . Healthy Son   . Heart disease Maternal Grandmother     Health Maintenance  Topic Date Due  . INFLUENZA VACCINE  11/09/2017 (Originally 07/10/2017)  . DEXA SCAN  11/09/2017 (Originally 01/15/1986)  . TETANUS/TDAP  11/09/2017 (Originally 01/16/1938)  . PNA vac Low Risk Adult (1 of 2 - PCV13) 11/09/2017 (Originally 01/15/1986)    Allergies  Allergen Reactions  . Sulfur Swelling and Rash    Outpatient Encounter Prescriptions as of 07/22/2017  Medication Sig  . acetaminophen (TYLENOL) 500 MG tablet Take 1,000 mg by mouth 3 (three) times daily. Max of 3 grams acetaminophen per 24 hrs from all sources.  Jearl Klinefelter ELLIPTA 62.5-25 MCG/INH AEPB Inhale 1 Inhaler into the lungs daily.   Marland Kitchen apixaban (ELIQUIS) 2.5 MG TABS tablet Take 1 tablet (2.5 mg total) by mouth 2 (two) times daily. Resume 07/20/17  . atorvastatin (LIPITOR) 80 MG tablet Take 1 tablet (80 mg total) by mouth daily at 6 PM.  . cycloSPORINE (RESTASIS) 0.05 % ophthalmic emulsion Place 1 drop into both eyes every 12 (twelve) hours.   Marland Kitchen diltiazem (CARDIZEM CD) 180 MG 24 hr capsule TAKE ONE CAPSULE BY MOUTH ONCE DAILY.  . ferrous sulfate (KP FERROUS SULFATE) 325 (65 FE) MG tablet Take 325 mg by mouth daily with breakfast.  . furosemide (LASIX) 20 MG tablet Take 3 tablets (60 mg total) by mouth daily.  Marland Kitchen gabapentin (NEURONTIN) 100 MG capsule Take 1 capsule (100 mg total) by mouth 3 (three) times daily.  . Multiple Vitamin (MULITIVITAMIN WITH MINERALS) TABS Take 1 tablet by mouth every other day.   Marland Kitchen omeprazole (PRILOSEC) 20 MG capsule Take 20 mg by mouth daily.    . OXYGEN Inhale 2 L into the lungs 2 (two) times daily. 3:15pm to 11:15pm 11:15pm to 07:15am  . Polyethyl Glycol-Propyl Glycol (SYSTANE OP) Place 1 drop into both eyes 3 (three) times daily. For dry  eyes  . polyethylene glycol (MIRALAX / GLYCOLAX) packet Take 17 g by mouth daily. Hold for diarrhea  . potassium chloride (K-DUR,KLOR-CON) 10 MEQ tablet Take 20 mEq by mouth daily.  Marland Kitchen PROAIR HFA 108 (90 BASE) MCG/ACT inhaler Inhale 2 puffs into the lungs 3 (three) times daily as needed for wheezing (cough).   . promethazine (PHENERGAN) 12.5 MG tablet Take 12.5 mg by mouth every 6 (six) hours as needed for nausea or vomiting.  . traMADol (ULTRAM) 50 MG tablet Take 50 mg by mouth 2 (two) times daily.    No facility-administered encounter medications on file as of 07/22/2017.      Review of Systems  Review of Systems  Constitutional: Negative for activity change, appetite change, chills, diaphoresis, fatigue and fever.  HENT: Negative for mouth sores, postnasal drip, rhinorrhea, sinus pain and sore throat.   Respiratory: Negative for apnea, cough, chest tightness, shortness of breath and wheezing.   Cardiovascular: Negative for chest pain, palpitations and Positive for  leg swelling.  Gastrointestinal: Negative for abdominal distention, abdominal pain, constipation, diarrhea, nausea and vomiting.  Genitourinary: Negative for dysuria and frequency.  Musculoskeletal: Negative for arthralgias, joint swelling and myalgias.  Skin: Negative for rash.  Neurological: Negative for dizziness, syncope, weakness, light-headedness and numbness.  Psychiatric/Behavioral: Negative for behavioral problems, confusion Positive for Insomnia   Vitals:   07/22/17 1122  BP: (!) 179/72 repeat manually 150/90  Pulse: 62  Resp: 20  Temp: 97.8 F (36.6 C)  TempSrc: Oral   There is no height or weight on file to calculate BMI. Physical Exam  Constitutional: She is oriented to person, place, and time. She appears well-developed and well-nourished.  HENT:  Head: Normocephalic.  Mouth/Throat: Oropharynx is clear and moist.  Eyes: Pupils are equal, round, and reactive to light.  Neck: Neck supple.    Cardiovascular: Normal rate and normal heart sounds.  An irregular rhythm present.  No murmur heard. Pulmonary/Chest: Effort normal and breath sounds normal. No respiratory distress. She has no wheezes. She has no rales.  Abdominal: Soft. Bowel sounds are normal. She exhibits no distension. There is no tenderness. There is no rebound.  Musculoskeletal: She exhibits edema.  Neurological: She is alert and oriented to person, place, and time.  No focal deficit  Skin: Skin is warm and dry.  Psychiatric: She has a normal mood and affect. Her behavior is normal. Judgment and thought content normal.    Labs reviewed: Basic Metabolic Panel:  Recent Labs  07/15/17 0805  07/17/17 0210 07/20/17 0800 07/22/17 0700  NA 134*  < > 138 133* 135  K 5.5*  < > 3.8 3.5 4.4  CL 103  < > 106 99* 103  CO2 19*  < > 22 25 25   GLUCOSE 94  < > 109* 97 90  BUN 53*  < > 29* 36* 25*  CREATININE 1.52*  < > 0.87 0.93 0.86  CALCIUM 8.7*  < > 8.6* 8.3* 8.4*  MG 2.3  --   --   --   --   < > = values in this interval not displayed. Liver Function Tests:  Recent Labs  07/08/17 1641 07/09/17 0705 07/14/17 1719  AST 33 25 36  ALT 25 22 30   ALKPHOS 134* 119 125  BILITOT 0.8 0.7 0.6  PROT 6.8 6.1* 5.9*  ALBUMIN 3.6 3.2* 3.2*    Recent Labs  07/08/17 1641  LIPASE 26   No results for input(s): AMMONIA in the last 8760 hours. CBC:  Recent Labs  07/14/17 1719  07/15/17 0805 07/20/17 0800 07/22/17 0700  WBC 11.3*  --  11.9* 6.5 6.7  NEUTROABS 8.6*  --   --  4.2 3.8  HGB 10.1*  < > 10.2* 10.1* 10.1*  HCT 31.8*  < > 32.2* 30.9* 32.1*  MCV 88.3  --  85.6 85.1 86.5  PLT 258  --  236 220 275  < > = values in this interval not displayed. Cardiac Enzymes:  Recent Labs  07/08/17 1641 07/14/17 1736  TROPONINI <0.03 0.06*   BNP: Invalid input(s): POCBNP Lab Results  Component Value Date   HGBA1C 5.5 12/06/2016   Lab Results  Component  Value Date   TSH 1.446 07/15/2017   No results found  for: VITAMINB12 No results found for: FOLATE No results found for: IRON, TIBC, FERRITIN  Imaging and Procedures obtained prior to SNF admission: No results found.  Assessment/Plan S/P Pacemaker insertion Patient has done well in facility. She is stable Is follow up with Cardiology   Chronic atrial fibrillation  Rate is controlled on Diltiazem. Continue Eliquis  Chronic diastolic heart failure  On increased dose of  Lasix. Doing well. Renal function stable Continue to monitor weights  Essential hypertension BP controlled. No Change  Internal carotid artery stenosis, bilateral Patient on  Eliquis. Son has decided to not be aggressive.  S/P Cerebrovascular accident with mild Dysphasia Doing well on Eliquis . Aspirin was held due to her GI bleed. No change required. She is on high dose of Statins  Hemorrhoids Patient has not had any more episodes of rectal bleed.or abdominal Pain Her Hgb is stable on Iron. GI had decided not to do any banding procedure due to her age and Eliquis.  Insomnia She has agreed to try Melatonin as needed. Back and Joint pain Controlled on Ultram. She is also now on Neurontin.   Family/ staff Communication: D/W the Son and Daughter in the room  Labs/tests ordered: BMP and CBC  Total time spent in this patient care encounter was _45 minutes; greater than 50% of the visit spent counseling patient, reviewing records , Labs and coordinating care for problems addressed at this encounter.

## 2017-07-24 ENCOUNTER — Other Ambulatory Visit: Payer: Self-pay

## 2017-07-24 MED ORDER — TRAMADOL HCL 50 MG PO TABS
50.0000 mg | ORAL_TABLET | Freq: Two times a day (BID) | ORAL | 0 refills | Status: DC
Start: 2017-07-24 — End: 2017-11-13

## 2017-07-24 NOTE — Telephone Encounter (Signed)
RX Fax for [facility]@ [phone]  

## 2017-07-25 ENCOUNTER — Encounter: Payer: Self-pay | Admitting: Cardiology

## 2017-07-25 ENCOUNTER — Other Ambulatory Visit (HOSPITAL_COMMUNITY)
Admission: RE | Admit: 2017-07-25 | Discharge: 2017-07-25 | Disposition: A | Discharge status: Home / Self Care | Payer: Medicare Other | Source: Skilled Nursing Facility | Attending: Internal Medicine | Admitting: Internal Medicine

## 2017-07-25 ENCOUNTER — Ambulatory Visit (INDEPENDENT_AMBULATORY_CARE_PROVIDER_SITE_OTHER): Payer: Medicare Other | Admitting: Cardiology

## 2017-07-25 DIAGNOSIS — I480 Paroxysmal atrial fibrillation: Secondary | ICD-10-CM | POA: Diagnosis not present

## 2017-07-25 DIAGNOSIS — I5033 Acute on chronic diastolic (congestive) heart failure: Secondary | ICD-10-CM | POA: Diagnosis not present

## 2017-07-25 DIAGNOSIS — I482 Chronic atrial fibrillation: Secondary | ICD-10-CM | POA: Insufficient documentation

## 2017-07-25 DIAGNOSIS — Z7901 Long term (current) use of anticoagulants: Secondary | ICD-10-CM | POA: Diagnosis not present

## 2017-07-25 DIAGNOSIS — I442 Atrioventricular block, complete: Secondary | ICD-10-CM | POA: Diagnosis not present

## 2017-07-25 LAB — CBC WITH DIFFERENTIAL/PLATELET
BASOS PCT: 0 %
Basophils Absolute: 0 10*3/uL (ref 0.0–0.1)
EOS ABS: 0.2 10*3/uL (ref 0.0–0.7)
EOS PCT: 4 %
HCT: 37.1 % (ref 36.0–46.0)
Hemoglobin: 11.4 g/dL — ABNORMAL LOW (ref 12.0–15.0)
LYMPHS ABS: 1.7 10*3/uL (ref 0.7–4.0)
Lymphocytes Relative: 30 %
MCH: 26.9 pg (ref 26.0–34.0)
MCHC: 30.7 g/dL (ref 30.0–36.0)
MCV: 87.5 fL (ref 78.0–100.0)
MONO ABS: 0.7 10*3/uL (ref 0.1–1.0)
MONOS PCT: 13 %
NEUTROS PCT: 53 %
Neutro Abs: 3.1 10*3/uL (ref 1.7–7.7)
PLATELETS: 349 10*3/uL (ref 150–400)
RBC: 4.24 MIL/uL (ref 3.87–5.11)
RDW: 15.2 % (ref 11.5–15.5)
WBC: 5.7 10*3/uL (ref 4.0–10.5)

## 2017-07-25 LAB — BASIC METABOLIC PANEL
Anion gap: 10 (ref 5–15)
BUN: 21 mg/dL — AB (ref 6–20)
CALCIUM: 8.9 mg/dL (ref 8.9–10.3)
CHLORIDE: 99 mmol/L — AB (ref 101–111)
CO2: 26 mmol/L (ref 22–32)
CREATININE: 0.83 mg/dL (ref 0.44–1.00)
GFR calc non Af Amer: 58 mL/min — ABNORMAL LOW (ref >60)
Glucose, Bld: 106 mg/dL — ABNORMAL HIGH (ref 65–99)
Potassium: 4.4 mmol/L (ref 3.5–5.1)
SODIUM: 135 mmol/L (ref 135–145)

## 2017-07-25 NOTE — Assessment & Plan Note (Signed)
Admitted 07/08/17 with D CHF and AF with RVR. She has been stable since discharge after he pacemaker implant

## 2017-07-25 NOTE — Assessment & Plan Note (Signed)
S/P St Jude PTVDP 07/17/17

## 2017-07-25 NOTE — Assessment & Plan Note (Signed)
Still in AF today on exam-rate controlled

## 2017-07-25 NOTE — Patient Instructions (Signed)
Medication Instructions:  Your physician recommends that you continue on your current medications as directed. Please refer to the Current Medication list given to you today.   Labwork: none  Testing/Procedures: none  Follow-Up: Your physician recommends that you schedule a follow-up appointment in: 3 months for a pace maker check  (Dr. Jolyn Nap)    Any Other Special Instructions Will Be Listed Below (If Applicable).     If you need a refill on your cardiac medications before your next appointment, please call your pharmacy.

## 2017-07-25 NOTE — Progress Notes (Signed)
07/25/2017 Wendy Perkins   1919-12-10  [id number]  Primary Physician Virgie Dad, MD Primary Cardiologist: Dr McDowell/ Dr Caryl Comes  HPI:  [place] 83 y/o female seen by Dr Domenic Polite in the past for diastolic CHF and permanent atrilal fibrilation. She has been on Eliquis. LOV was 2015. She presented 07/08/17 with CHF and discharge 07/09/17. She was re admitted 07/14/17 with CHB and slow rates. She had a St Jude pacemaker implanted 07/17/17 by Dr Caryl Comes. She was discharged back to the [facility] SNF and is seeing me in the office for follow up. Her son accompanied her. She has done well since discharge, no unusual dyspnea.    Current Outpatient Prescriptions  Medication Sig Dispense Refill  . traMADol (ULTRAM) 50 MG tablet Take 1 tablet (50 mg total) by mouth 2 (two) times daily. 60 tablet 0   No current facility-administered medications for this visit.     Allergies  Allergen Reactions  . Sulfur Swelling and Rash    Past Medical History:  Diagnosis Date  . Atrial fibrillation ([place])   . Breast nodule 11/15/2014  . Breast pain, left 11/15/2014  . Carotid artery disease ([place])   . Dizziness   . Dyspnea    Previous CPX suggesting possible restrictive physiology, respiratory muscle fatigue, diastolic dysfunction  . Essential hypertension, benign   . Hyperlipidemia   . Oxygen dependent    2 liter  . Rib pain on left side 11/15/2014  . Seasonal allergies   . Shingles 05/04/2015  . Toe fracture, left    left big toe    Social History   Social History  . Marital status: Widowed    Spouse name: N/A  . Number of children: N/A  . Years of education: N/A   Occupational History  . Not on file.   Social History Main Topics  . Smoking status: Never Smoker  . Smokeless tobacco: Never Used  . Alcohol use No  . Drug use: No  . Sexual activity: No   Other Topics Concern  . Not on file   Social History Narrative  . No narrative on file     Family History  Problem Relation Age  of Onset  . Stroke Mother   . Pneumonia Father   . Heart disease Father   . Healthy Son   . Glaucoma Daughter   . Emphysema Daughter   . Healthy Daughter   . Other Daughter        had knee replacement  . Healthy Son   . Heart disease Maternal Grandmother      Review of Systems: General: negative for chills, fever, night sweats or weight changes.  Cardiovascular: negative for chest pain, dyspnea on exertion, edema, orthopnea, palpitations, paroxysmal nocturnal dyspnea or shortness of breath Dermatological: negative for rash Respiratory: negative for cough or wheezing Urologic: negative for hematuria Abdominal: negative for nausea, vomiting, diarrhea, bright red blood per rectum, melena, or hematemesis Neurologic: negative for visual changes, syncope, or dizziness All other systems reviewed and are otherwise negative except as noted above.    Blood pressure 138/68, pulse 71, height 5\' 4"  (1.626 m), weight 140 lb (63.5 kg), SpO2 96 %.  General appearance: alert, cooperative, appears stated age and no distress Neck: no JVD Lungs: clear to auscultation bilaterally Heart: irregularly irregular rhythm Extremities: trace LE edema Skin: Skin color, texture, turgor normal. No rashes or lesions Neurologic: Grossly normal, Lt facial droop from prior CVA Pacer site; Pacer suture line intact. She does  has some soft swelling around the pacer site but so significant ecchymosis or sign of infectio  Echo 07/09/17- Study Conclusions  - Left ventricle: The cavity size was normal. Wall thickness was   increased in a pattern of mild LVH. Systolic function was normal.   The estimated ejection fraction was in the range of 60% to 65%.   Wall motion was normal; there were no regional wall motion   abnormalities. - Aortic valve: Mildly calcified annulus. Trileaflet; mildly   thickened leaflets. - Mitral valve: Moderately calcified annulus. Moderately thickened   leaflets . The findings are  consistent with mild to moderate   stenosis. There was mild regurgitation. Pressure half-time: 150   ms. Mean gradient (D): 4 mm Hg. Valve area by pressure half-time:   1.47 cm^2. - Left atrium: The atrium was severely dilated. - Right ventricle: The cavity size was mildly dilated. - Right atrium: The atrium was severely dilated. - Pulmonary arteries: Systolic pressure was moderately increased.   PA peak pressure: 47 mm Hg (S). - Technically adequate study.   ASSESSMENT AND PLAN:   Atrial fibrillation ([place]) Still in AF today on exam-rate controlled  Complete heart block ([place]) S/P St Jude PTVDP 07/17/17  Acute on chronic diastolic (congestive) heart failure ([place]) Admitted 07/08/17 with D CHF and AF with RVR. She has been stable since discharge after he pacemaker implant  Chronic anticoagulation Eliquis 2.5 mg BID   PLAN  I spoke with the nursing staff at [facility], watch for infection or drainage from pacemaker site otherwise no change. Formal pacer interrogation in 3 months with Dr Caryl Comes.  I'm not sure when she was started on Lipitor 80 mg, it doesn't look like she was on it in 2015,  but she probably does not need this from our standpoint- will defer to PCP.   Kerin Ransom PA-C 07/25/2017 2:05 PM

## 2017-07-25 NOTE — Assessment & Plan Note (Signed)
Eliquis 2.5mg BID.  

## 2017-07-26 DIAGNOSIS — G4700 Insomnia, unspecified: Secondary | ICD-10-CM | POA: Insufficient documentation

## 2017-07-30 DIAGNOSIS — H34811 Central retinal vein occlusion, right eye, with macular edema: Secondary | ICD-10-CM | POA: Diagnosis not present

## 2017-08-05 ENCOUNTER — Ambulatory Visit (INDEPENDENT_AMBULATORY_CARE_PROVIDER_SITE_OTHER): Payer: Medicare Other | Admitting: *Deleted

## 2017-08-05 DIAGNOSIS — I480 Paroxysmal atrial fibrillation: Secondary | ICD-10-CM | POA: Diagnosis not present

## 2017-08-05 LAB — CUP PACEART INCLINIC DEVICE CHECK
Date Time Interrogation Session: 20180827105104
Implantable Pulse Generator Implant Date: 20180808
Lead Channel Pacing Threshold Amplitude: 0.75 V
Lead Channel Pacing Threshold Amplitude: 0.75 V
Lead Channel Pacing Threshold Pulse Width: 0.5 ms
Lead Channel Sensing Intrinsic Amplitude: 5.9 mV
Lead Channel Setting Pacing Pulse Width: 0.5 ms
MDC IDC LEAD IMPLANT DT: 20180808
MDC IDC LEAD LOCATION: 753860
MDC IDC MSMT BATTERY REMAINING LONGEVITY: 108 mo
MDC IDC MSMT BATTERY VOLTAGE: 2.98 V
MDC IDC MSMT LEADCHNL RV IMPEDANCE VALUE: 662.5 Ohm
MDC IDC MSMT LEADCHNL RV PACING THRESHOLD PULSEWIDTH: 0.5 ms
MDC IDC SET LEADCHNL RV PACING AMPLITUDE: 3.5 V
MDC IDC SET LEADCHNL RV SENSING SENSITIVITY: 2 mV
MDC IDC STAT BRADY RV PERCENT PACED: 47 %
Pulse Gen Model: 1272
Pulse Gen Serial Number: 7957820

## 2017-08-05 NOTE — Progress Notes (Signed)
Wound check appointment. Dermabond removed. Wound without redness or edema. Incision edges approximated, wound well healed. Normal device function. Threshold, sensing, and impedance consistent with implant measurements. Device programmed at 3.5V for extra safety margin until 3 month visit. Histogram distribution appropriate for patient and level of activity. No high ventricular rates noted. Patient educated about wound care, arm mobility, lifting restrictions. ROV in 11/13 with SK

## 2017-08-16 ENCOUNTER — Encounter (HOSPITAL_COMMUNITY)
Admission: AD | Admit: 2017-08-16 | Discharge: 2017-08-16 | Disposition: A | Discharge status: Home / Self Care | Payer: Medicare Other | Source: Skilled Nursing Facility | Attending: Pulmonary Disease | Admitting: Pulmonary Disease

## 2017-08-16 DIAGNOSIS — M25561 Pain in right knee: Secondary | ICD-10-CM | POA: Insufficient documentation

## 2017-08-16 DIAGNOSIS — Z9981 Dependence on supplemental oxygen: Secondary | ICD-10-CM | POA: Insufficient documentation

## 2017-08-16 DIAGNOSIS — E785 Hyperlipidemia, unspecified: Secondary | ICD-10-CM | POA: Insufficient documentation

## 2017-08-16 DIAGNOSIS — M6281 Muscle weakness (generalized): Secondary | ICD-10-CM | POA: Insufficient documentation

## 2017-08-16 LAB — URINALYSIS, ROUTINE W REFLEX MICROSCOPIC
Bilirubin Urine: NEGATIVE
GLUCOSE, UA: NEGATIVE mg/dL
Ketones, ur: NEGATIVE mg/dL
NITRITE: NEGATIVE
PROTEIN: NEGATIVE mg/dL
SPECIFIC GRAVITY, URINE: 1.011 (ref 1.005–1.030)
pH: 5 (ref 5.0–8.0)

## 2017-08-19 ENCOUNTER — Encounter: Payer: Self-pay | Admitting: Internal Medicine

## 2017-08-19 ENCOUNTER — Non-Acute Institutional Stay (SKILLED_NURSING_FACILITY): Payer: Medicare Other | Admitting: Internal Medicine

## 2017-08-19 DIAGNOSIS — I5032 Chronic diastolic (congestive) heart failure: Secondary | ICD-10-CM

## 2017-08-19 DIAGNOSIS — N390 Urinary tract infection, site not specified: Secondary | ICD-10-CM | POA: Diagnosis not present

## 2017-08-19 DIAGNOSIS — I480 Paroxysmal atrial fibrillation: Secondary | ICD-10-CM

## 2017-08-19 DIAGNOSIS — M545 Low back pain: Secondary | ICD-10-CM

## 2017-08-19 DIAGNOSIS — G8929 Other chronic pain: Secondary | ICD-10-CM

## 2017-08-19 DIAGNOSIS — I639 Cerebral infarction, unspecified: Secondary | ICD-10-CM

## 2017-08-19 LAB — URINE CULTURE: Culture: >=100000 — AB

## 2017-08-19 NOTE — Progress Notes (Signed)
Location:   [facility] Room Number: 156/W Place of Service:  SNF [phone]) Provider:  Gardiner Fanti, Rene Kocher, MD  Patient Care Team: Virgie Dad, MD as PCP - General (Internal Medicine) Rolm Baptise as Physician Assistant (Internal Medicine)  Extended Emergency Contact Information Primary Emergency Contact: Berdella, [place], [place] [address] [name] of [place] Phone: [phone] Relation: Son Secondary Emergency Contact: [name], [place] [address] [name] of [place] Phone: [phone] Mobile Phone: [phone] Relation: Son  Code Status:  DNR Goals of care: Advanced Directive information Advanced Directives 08/19/2017  Does Patient Have a Medical Advance Directive? Yes  Type of Advance Directive Out of facility DNR (pink MOST or yellow form)  Does patient want to make changes to medical advance directive? No - Patient declined  Copy of [place] in Chart? -  Would patient like information on creating a medical advance directive? No - Patient declined  Pre-existing out of facility DNR order (yellow form or pink MOST form) -     Chief Complaint  Patient presents with  . Medical Management of Chronic Issues    Routine Visit  . Acute Visit    UTI  Medical management of chronic medical conditions including atrial fibrillation-history of heart block with pacemaker-diastolic CHF-history CVA-hypertension-chronic knee and back pain-  HPI:  Pt is a 83 y.o. female seen today for medical management of chronic diseases. As noted above.  She was recently hospitalized for CHF and she was diuresed aggressively and this appears to have stabilized-she is on Lasix dose was increased.  She also socially was found to have heart block and received a pacemaker and appears to be tolerating this well.  She did see cardiology back on August 16 and thought to be stable doing well in regards  to atrial fibrillation in her pacemaker placement as well as a diastolic CHF.  She did complain apparently of some dysuria over the weekend and I urine culture was obtained which has grown out greater than thousand colonies of Escherichia coli.--She was started on Cipro empirically but sensitivity show resistance to this Today actually she says the dysuria is improved.  She denies any fever or chills she does have chronic knee and back pain but this appears to be stabilized with the tramadol as well as Neurontin.  In regards to atrial fibrillation she is on Eliquis for anticoagulation and diltiazem for rate control this appears to be effective.  She does have a history of CVA with some expressive aphasia she again is on  Eliquis as well as a statin per recent cardiology note there was some thought possibly the statin can be discontinued.  She also has a history of anemia in continues on iron suspect there also is an element of chronic disease here hemoglobin has been stable in the 11 range.  She also has a history of hemorrhoids but thought not to be a good candidate for banding secondary to her being on anticoagulation and advanced age she is not complaining of hemorrhoid pain today.  She also has a history of COPD this appears stable on pro-air as well as Anuro Ellipta--  she also is on oxygen at night        Past Medical History:  Diagnosis Date  . Atrial fibrillation ([place])   . Breast nodule 11/15/2014  . Breast pain, left 11/15/2014  .  Carotid artery disease ([place])   . Dizziness   . Dyspnea    Previous CPX suggesting possible restrictive physiology, respiratory muscle fatigue, diastolic dysfunction  . Essential hypertension, benign   . Hyperlipidemia   . Oxygen dependent    2 liter  . Rib pain on left side 11/15/2014  . Seasonal allergies   . Shingles 05/04/2015  . Toe fracture, left    left big toe   Past Surgical History:  Procedure Laterality Date  . COLONOSCOPY   02/22/2012   Procedure: COLONOSCOPY;  Surgeon: Rogene Houston, MD;  Location: [facility];  Service: Endoscopy;  Laterality: N/A;  100  . KNEE SURGERY     bilateral  . PACEMAKER IMPLANT N/A 07/17/2017   Procedure: Pacemaker Implant;  Surgeon: Deboraha Sprang, MD;  Location: [facility];  Service: Cardiovascular;  Laterality: N/A;    Allergies  Allergen Reactions  . Sulfur Swelling and Rash    Outpatient Encounter Prescriptions as of 08/19/2017  Medication Sig  . acetaminophen (TYLENOL) 500 MG tablet Take 1,000 mg by mouth 3 (three) times daily. Max of 3 grams acetaminophen per 24 hrs from all sources.  Jearl Klinefelter ELLIPTA 62.5-25 MCG/INH AEPB Inhale 1 Inhaler into the lungs daily.   Marland Kitchen apixaban (ELIQUIS) 2.5 MG TABS tablet Take 1 tablet (2.5 mg total) by mouth 2 (two) times daily. Resume 07/20/17  . atorvastatin (LIPITOR) 80 MG tablet Take 1 tablet (80 mg total) by mouth daily at 6 PM.  . ciprofloxacin (CIPRO) 250 MG tablet Take 250 mg by mouth 2 (two) times daily. Give for 7 days  . cycloSPORINE (RESTASIS) 0.05 % ophthalmic emulsion Place 1 drop into both eyes every 12 (twelve) hours.   Marland Kitchen diltiazem (CARDIZEM CD) 180 MG 24 hr capsule TAKE ONE CAPSULE BY MOUTH ONCE DAILY.  . ferrous sulfate (KP FERROUS SULFATE) 325 (65 FE) MG tablet Take 325 mg by mouth daily with breakfast.  . furosemide (LASIX) 20 MG tablet Take 3 tablets (60 mg total) by mouth daily.  Marland Kitchen gabapentin (NEURONTIN) 100 MG capsule Take 1 capsule (100 mg total) by mouth 3 (three) times daily.  . Melatonin 5 MG TABS Take 5 mg by mouth at bedtime as needed.  . Menthol, Topical Analgesic, (BIOFREEZE) 4 % GEL Apply to sight of pain to knees twice a day  . Multiple Vitamin (MULITIVITAMIN WITH MINERALS) TABS Take 1 tablet by mouth every other day.   Marland Kitchen omeprazole (PRILOSEC) 20 MG capsule Take 20 mg by mouth daily.    . OXYGEN Inhale 2 L into the lungs 2 (two) times daily. 3:15pm to 11:15pm 11:15pm to 07:15am  . Polyethyl  Glycol-Propyl Glycol (SYSTANE OP) Place 1 drop into both eyes 3 (three) times daily. For dry eyes  . polyethylene glycol (MIRALAX / GLYCOLAX) packet Take 17 g by mouth daily. Hold for diarrhea  . potassium chloride (K-DUR,KLOR-CON) 10 MEQ tablet Take 20 mEq by mouth daily.  Marland Kitchen PROAIR HFA 108 (90 BASE) MCG/ACT inhaler Inhale 2 puffs into the lungs 3 (three) times daily as needed for wheezing (cough).   . Probiotic Product (RISA-BID PROBIOTIC) TABS Take 1 tablet by mouth twice a day  . promethazine (PHENERGAN) 12.5 MG tablet Take 12.5 mg by mouth every 6 (six) hours as needed for nausea or vomiting.  . traMADol (ULTRAM) 50 MG tablet Take 1 tablet (50 mg total) by mouth 2 (two) times daily.   No facility-administered encounter medications on file as of 08/19/2017.  Review of Systems   Gen. she is not complaining of any fever or chills appears weight is relatively stable possibly gained a couple pounds here over the past few weeks but she is eating better.  Skin does not complain of rashes or itching.  Head ears eyes nose mouth and throat does not complain of visual changes has prescription lenses does have a history of retinal artery occlusion and has been followed by ophthalmology.  Respiratory does not complain shortness of breath or cough.  Cardiac denies chest pain is status post pacemaker denies palpitations has what appears to be stable possibly somewhat improved lower extremity edema.  GI does not complain of any abdominal discomfort nausea vomiting diarrhea constipation.  GU says her dysuria actually is improved today.  Musculoskeletal has a history of chronic knee and back pain but actually is not really complaining of pain today.  Neurologic does not complain of dizziness headache or numbness.  Psych appears to be in good spirits does not complain of anxiety or overt depression.  She does have a history of insomnia on melatonin.      Immunization History    Administered Date(s) Administered  . Influenza-Unspecified 10/10/2013   Pertinent  Health Maintenance Due  Topic Date Due  . INFLUENZA VACCINE  11/09/2017 (Originally 07/10/2017)  . DEXA SCAN  11/09/2017 (Originally 01/15/1986)  . PNA vac Low Risk Adult (1 of 2 - PCV13) 11/09/2017 (Originally [birth year]/1986)   No flowsheet data found. Functional Status Survey:    Vitals:   08/19/17 1403  BP: 127/84  Pulse: 76  Resp: 20  Temp: 98.3 F (36.8 C)  TempSrc: Oral  SpO2: 97%  Weight: 142 lb 3.2 oz (64.5 kg)  Height: 5\' 4"  (1.626 m)   Body mass index is 24.41 kg/m. Physical Exam  in general this is a pleasant elderly female in no distress sitting comfortably in her wheelchair she is bright and alert.  Her skin is warm and dry.  Eyes sclera and conjunctiva are clear pupils appear reactive to light visual acuity appears grossly intact she has prescription lenses.  Oropharynx is clear mucous membranes moist.  Heart is regular rate and rhythm with occasional irregular beat she has I would say one plus lower extremity edema.  Chest is clear to auscultation there is no labored breathing.  Abdomen is soft nontender with positive bowel sounds.  GU cannot really appreciate significant suprapubic tenderness  Musculoskeletal is able to move all extremities 4 could not really appreciate true lateralizing findings she does have some expressive aphasia but is able to communicate quite effectively-.  Neurologic as noted above cranial nerves appear grossly intact she does have some expressive aphasia but is able to communicate could not really appreciate true lateralizing findings  Psych he is alert and oriented pleasant and appropriate appears to be feeling well Labs reviewed:  Recent Labs  07/15/17 0805  07/20/17 0800 07/22/17 0700 07/25/17 0800  NA 134*  < > 133* 135 135  K 5.5*  < > 3.5 4.4 4.4  CL 103  < > 99* 103 99*  CO2 19*  < > 25 25 26   GLUCOSE 94  < > 97 90 106*  BUN 53*  <  > 36* 25* 21*  CREATININE 1.52*  < > 0.93 0.86 0.83  CALCIUM 8.7*  < > 8.3* 8.4* 8.9  MG 2.3  --   --   --   --   < > = values in this interval not displayed.  Recent Labs  07/08/17 1641 07/09/17 0705 07/14/17 1719  AST 33 25 36  ALT 25 22 30   ALKPHOS 134* 119 125  BILITOT 0.8 0.7 0.6  PROT 6.8 6.1* 5.9*  ALBUMIN 3.6 3.2* 3.2*    Recent Labs  07/20/17 0800 07/22/17 0700 07/25/17 0800  WBC 6.5 6.7 5.7  NEUTROABS 4.2 3.8 3.1  HGB 10.1* 10.1* 11.4*  HCT 30.9* 32.1* 37.1  MCV 85.1 86.5 87.5  PLT 220 275 349   Lab Results  Component Value Date   TSH 1.446 07/15/2017   Lab Results  Component Value Date   HGBA1C 5.5 12/06/2016   Lab Results  Component Value Date   CHOL 98 06/04/2017   HDL 50 06/04/2017   LDLCALC 40 06/04/2017   TRIG 40 06/04/2017   CHOLHDL 2.0 06/04/2017    Significant Diagnostic Results in last 30 days:  No results found.  Assessment/Plan  #1 UTI-she appears to be relatively asymptomatic now-I did discuss this with Dr. Lyndel Safe and will start her on Augmentin 500 mg twice a day for 5 days and monitor also  orderprobiotic twice a day for 7 days.  #2 atrial fibrillation this appears rate controlled on diltiazem she is on Eliquis for anticoagulation.  #3 history of heart block status post pacemaker she is followed closely by cardiology and per chart review pacemaker check is pending-she appears to be doing well in this regards.  #4 history of chronic knee and back pain and this has been a significant issue in the past but she appears comfortable now she is receiving the tramadol twice a day and also receives Neurontin 3 times a day low-dose 100 mg will monitor.  #5 history of diastolic CHF she continues on Lasix 60 mg day with potassium 20 mEq a day will update a metabolic panel to keep an eye on her renal function and electrolytes her weight has been relatively stable appears to have gained a couple pounds over the past few weeks but I suspect this  may be more appetite related edema looks baseline clinically she appears to be stable.  #6-history of internal carotid artery stenosis she is again on Eliquis--  #7 history of hemorrhoid discomfort this has been a significant issue in the past but appears to have stabilized recently she has seen GI recommendation was for conservative follow-up at this point secondary due to her advanced age -- did not think at this point she was an aggressive candidate for bandin g     8- history of CVA with ICA stenosis again she is on anticoagulation with Eliquis  as well as a statin cardiology discretion whether statin at this point is necessary with her advanced age.  Anemia-she is on iron hemoglobin appears to be relatively stable and actually somewhat improved at 11.4-will update this.  #10-COPD this appears stable she is on oxygen at night continues on Provera and Anuro Ellipta  #11 retinal artery occlusion she appears to be stable in this regards and has been seen by ophthalmology.    Of note secondary to being on aggressive diuresis will update a BMP-also will update again a CBC with her history of anemia.  Noted did discuss his status of the statin with Dr. Lyndel Safe at this point we'll continue with since she does have a history of CVA    [id number]--of note greater than 45 minutes spent assessing patient-reviewing her chart-reviewing her labs-discussing her status with nursing staff  and Dr. Corrinne Eagle coordinating and formulating a plan of care  for numerous diagnoses-of note greater than 50% of time spent coordinating plan of care

## 2017-08-20 ENCOUNTER — Encounter (HOSPITAL_COMMUNITY)
Admission: RE | Admit: 2017-08-20 | Discharge: 2017-08-20 | Disposition: A | Discharge status: Home / Self Care | Payer: Medicare Other | Source: Skilled Nursing Facility | Attending: Internal Medicine | Admitting: Internal Medicine

## 2017-08-20 DIAGNOSIS — I63312 Cerebral infarction due to thrombosis of left middle cerebral artery: Secondary | ICD-10-CM | POA: Insufficient documentation

## 2017-08-20 DIAGNOSIS — Z950 Presence of cardiac pacemaker: Secondary | ICD-10-CM | POA: Insufficient documentation

## 2017-08-20 DIAGNOSIS — K625 Hemorrhage of anus and rectum: Secondary | ICD-10-CM | POA: Insufficient documentation

## 2017-08-20 DIAGNOSIS — I442 Atrioventricular block, complete: Secondary | ICD-10-CM | POA: Insufficient documentation

## 2017-08-20 LAB — CBC WITH DIFFERENTIAL/PLATELET
BASOS ABS: 0 10*3/uL (ref 0.0–0.1)
BASOS PCT: 0 %
EOS ABS: 0.2 10*3/uL (ref 0.0–0.7)
Eosinophils Relative: 3 %
HCT: 40.5 % (ref 36.0–46.0)
HEMOGLOBIN: 12.9 g/dL (ref 12.0–15.0)
LYMPHS ABS: 2 10*3/uL (ref 0.7–4.0)
Lymphocytes Relative: 27 %
MCH: 27.3 pg (ref 26.0–34.0)
MCHC: 31.9 g/dL (ref 30.0–36.0)
MCV: 85.8 fL (ref 78.0–100.0)
Monocytes Absolute: 0.9 10*3/uL (ref 0.1–1.0)
Monocytes Relative: 13 %
NEUTROS PCT: 57 %
Neutro Abs: 4.2 10*3/uL (ref 1.7–7.7)
Platelets: 226 10*3/uL (ref 150–400)
RBC: 4.72 MIL/uL (ref 3.87–5.11)
RDW: 15.8 % — ABNORMAL HIGH (ref 11.5–15.5)
WBC: 7.3 10*3/uL (ref 4.0–10.5)

## 2017-08-20 LAB — BASIC METABOLIC PANEL
Anion gap: 10 (ref 5–15)
BUN: 25 mg/dL — ABNORMAL HIGH (ref 6–20)
CHLORIDE: 103 mmol/L (ref 101–111)
CO2: 25 mmol/L (ref 22–32)
Calcium: 9.1 mg/dL (ref 8.9–10.3)
Creatinine, Ser: 0.77 mg/dL (ref 0.44–1.00)
Glucose, Bld: 101 mg/dL — ABNORMAL HIGH (ref 65–99)
POTASSIUM: 4 mmol/L (ref 3.5–5.1)
SODIUM: 138 mmol/L (ref 135–145)

## 2017-09-02 ENCOUNTER — Non-Acute Institutional Stay (SKILLED_NURSING_FACILITY): Payer: Medicare Other

## 2017-09-02 DIAGNOSIS — Z0000 Encounter for general adult medical examination without abnormal findings: Secondary | ICD-10-CM

## 2017-09-02 NOTE — Patient Instructions (Signed)
Wendy Perkins , Thank you for taking time to come for your Medicare Wellness Visit. I appreciate your ongoing commitment to your health goals. Please review the following plan we discussed and let me know if I can assist you in the future.   Screening recommendations/referrals: Colonoscopy excluded, pt over age 81 Mammogram excluded, pt over age 76 Bone Density due Recommended yearly ophthalmology/optometry visit for glaucoma screening and checkup Recommended yearly dental visit for hygiene and checkup  Vaccinations: Influenza vaccine due Pneumococcal vaccine up to date. PNA 13 due 09/20/16 Tdap vaccine due Shingles vaccine not in records    Advanced directives: DNR in chart, copies of health care power of attorney and living will are needed   Conditions/risks identified: none  Next appointment: Dr. Lyndel Safe makes rounds   Preventive Care 65 Years and Older, Female Preventive care refers to lifestyle choices and visits with your health care provider that can promote health and wellness. What does preventive care include?  A yearly physical exam. This is also called an annual well check.  Dental exams once or twice a year.  Routine eye exams. Ask your health care provider how often you should have your eyes checked.  Personal lifestyle choices, including:  Daily care of your teeth and gums.  Regular physical activity.  Eating a healthy diet.  Avoiding tobacco and drug use.  Limiting alcohol use.  Practicing safe sex.  Taking low-dose aspirin every day.  Taking vitamin and mineral supplements as recommended by your health care provider. What happens during an annual well check? The services and screenings done by your health care provider during your annual well check will depend on your age, overall health, lifestyle risk factors, and family history of disease. Counseling  Your health care provider may ask you questions about your:  Alcohol use.  Tobacco use.  Drug  use.  Emotional well-being.  Home and relationship well-being.  Sexual activity.  Eating habits.  History of falls.  Memory and ability to understand (cognition).  Work and work Statistician.  Reproductive health. Screening  You may have the following tests or measurements:  Height, weight, and BMI.  Blood pressure.  Lipid and cholesterol levels. These may be checked every 5 years, or more frequently if you are over 18 years old.  Skin check.  Lung cancer screening. You may have this screening every year starting at age 32 if you have a 30-pack-year history of smoking and currently smoke or have quit within the past 15 years.  Fecal occult blood test (FOBT) of the stool. You may have this test every year starting at age 46.  Flexible sigmoidoscopy or colonoscopy. You may have a sigmoidoscopy every 5 years or a colonoscopy every 10 years starting at age 16.  Hepatitis C blood test.  Hepatitis B blood test.  Sexually transmitted disease (STD) testing.  Diabetes screening. This is done by checking your blood sugar (glucose) after you have not eaten for a while (fasting). You may have this done every 1-3 years.  Bone density scan. This is done to screen for osteoporosis. You may have this done starting at age 48.  Mammogram. This may be done every 1-2 years. Talk to your health care provider about how often you should have regular mammograms. Talk with your health care provider about your test results, treatment options, and if necessary, the need for more tests. Vaccines  Your health care provider may recommend certain vaccines, such as:  Influenza vaccine. This is recommended every year.  Tetanus, diphtheria, and acellular pertussis (Tdap, Td) vaccine. You may need a Td booster every 10 years.  Zoster vaccine. You may need this after age 44.  Pneumococcal 13-valent conjugate (PCV13) vaccine. One dose is recommended after age 55.  Pneumococcal polysaccharide  (PPSV23) vaccine. One dose is recommended after age 5. Talk to your health care provider about which screenings and vaccines you need and how often you need them. This information is not intended to replace advice given to you by your health care provider. Make sure you discuss any questions you have with your health care provider. Document Released: 12/23/2015 Document Revised: 08/15/2016 Document Reviewed: 09/27/2015 Elsevier Interactive Patient Education  2017 [place] Prevention in the Home Falls can cause injuries. They can happen to people of all ages. There are many things you can do to make your home safe and to help prevent falls. What can I do on the outside of my home?  Regularly fix the edges of walkways and driveways and fix any cracks.  Remove anything that might make you trip as you walk through a door, such as a raised step or threshold.  Trim any bushes or trees on the path to your home.  Use bright outdoor lighting.  Clear any walking paths of anything that might make someone trip, such as rocks or tools.  Regularly check to see if handrails are loose or broken. Make sure that both sides of any steps have handrails.  Any raised decks and porches should have guardrails on the edges.  Have any leaves, snow, or ice cleared regularly.  Use sand or salt on walking paths during winter.  Clean up any spills in your garage right away. This includes oil or grease spills. What can I do in the bathroom?  Use night lights.  Install grab bars by the toilet and in the tub and shower. Do not use towel bars as grab bars.  Use non-skid mats or decals in the tub or shower.  If you need to sit down in the shower, use a plastic, non-slip stool.  Keep the floor dry. Clean up any water that spills on the floor as soon as it happens.  Remove soap buildup in the tub or shower regularly.  Attach bath mats securely with double-sided non-slip rug tape.  Do not have  throw rugs and other things on the floor that can make you trip. What can I do in the bedroom?  Use night lights.  Make sure that you have a light by your bed that is easy to reach.  Do not use any sheets or blankets that are too big for your bed. They should not hang down onto the floor.  Have a firm chair that has side arms. You can use this for support while you get dressed.  Do not have throw rugs and other things on the floor that can make you trip. What can I do in the kitchen?  Clean up any spills right away.  Avoid walking on wet floors.  Keep items that you use a lot in easy-to-reach places.  If you need to reach something above you, use a strong step stool that has a grab bar.  Keep electrical cords out of the way.  Do not use floor polish or wax that makes floors slippery. If you must use wax, use non-skid floor wax.  Do not have throw rugs and other things on the floor that can make you trip. What can I do  with my stairs?  Do not leave any items on the stairs.  Make sure that there are handrails on both sides of the stairs and use them. Fix handrails that are broken or loose. Make sure that handrails are as long as the stairways.  Check any carpeting to make sure that it is firmly attached to the stairs. Fix any carpet that is loose or worn.  Avoid having throw rugs at the top or bottom of the stairs. If you do have throw rugs, attach them to the floor with carpet tape.  Make sure that you have a light switch at the top of the stairs and the bottom of the stairs. If you do not have them, ask someone to add them for you. What else can I do to help prevent falls?  Wear shoes that:  Do not have high heels.  Have rubber bottoms.  Are comfortable and fit you well.  Are closed at the toe. Do not wear sandals.  If you use a stepladder:  Make sure that it is fully opened. Do not climb a closed stepladder.  Make sure that both sides of the stepladder are  locked into place.  Ask someone to hold it for you, if possible.  Clearly mark and make sure that you can see:  Any grab bars or handrails.  First and last steps.  Where the edge of each step is.  Use tools that help you move around (mobility aids) if they are needed. These include:  Canes.  Walkers.  Scooters.  Crutches.  Turn on the lights when you go into a dark area. Replace any light bulbs as soon as they burn out.  Set up your furniture so you have a clear path. Avoid moving your furniture around.  If any of your floors are uneven, fix them.  If there are any pets around you, be aware of where they are.  Review your medicines with your doctor. Some medicines can make you feel dizzy. This can increase your chance of falling. Ask your doctor what other things that you can do to help prevent falls. This information is not intended to replace advice given to you by your health care provider. Make sure you discuss any questions you have with your health care provider. Document Released: 09/22/2009 Document Revised: 05/03/2016 Document Reviewed: 12/31/2014 Elsevier Interactive Patient Education  2017 [employer].

## 2017-09-02 NOTE — Progress Notes (Signed)
Subjective:   Wendy Perkins is a 83 y.o. female who presents for an Initial Medicare Annual Wellness Visit at [facility]        Objective:    Today's Vitals   09/02/17 1519  BP: (!) 160/64  Pulse: 73  Temp: 97.9 F (36.6 C)  TempSrc: Oral  SpO2: 98%  Weight: 142 lb (64.4 kg)  Height: 5\' 4"  (1.626 m)   Body mass index is 24.37 kg/m.   Current Medications (verified) Outpatient Encounter Prescriptions as of 09/02/2017  Medication Sig  . acetaminophen (TYLENOL) 500 MG tablet Take 1,000 mg by mouth 3 (three) times daily. Max of 3 grams acetaminophen per 24 hrs from all sources.  Wendy Perkins ELLIPTA 62.5-25 MCG/INH AEPB Inhale 1 Inhaler into the lungs daily.   Marland Kitchen apixaban (ELIQUIS) 2.5 MG TABS tablet Take 1 tablet (2.5 mg total) by mouth 2 (two) times daily. Resume 07/20/17  . atorvastatin (LIPITOR) 80 MG tablet Take 1 tablet (80 mg total) by mouth daily at 6 PM.  . cycloSPORINE (RESTASIS) 0.05 % ophthalmic emulsion Place 1 drop into both eyes every 12 (twelve) hours.   . diclofenac sodium (VOLTAREN) 1 % GEL Apply 1 application topically 4 (four) times daily as needed.  . diltiazem (CARDIZEM CD) 180 MG 24 hr capsule TAKE ONE CAPSULE BY MOUTH ONCE DAILY.  . ferrous sulfate (KP FERROUS SULFATE) 325 (65 FE) MG tablet Take 325 mg by mouth daily with breakfast.  . furosemide (LASIX) 20 MG tablet Take 3 tablets (60 mg total) by mouth daily.  Marland Kitchen gabapentin (NEURONTIN) 100 MG capsule Take 1 capsule (100 mg total) by mouth 3 (three) times daily.  . Melatonin 5 MG TABS Take 5 mg by mouth at bedtime as needed.  . Multiple Vitamin (MULITIVITAMIN WITH MINERALS) TABS Take 1 tablet by mouth every other day.   Marland Kitchen omeprazole (PRILOSEC) 20 MG capsule Take 20 mg by mouth daily.    . OXYGEN Inhale 2 L into the lungs 2 (two) times daily. 3:15pm to 11:15pm 11:15pm to 07:15am  . Polyethyl Glycol-Propyl Glycol (SYSTANE OP) Place 1 drop into both eyes 3 (three) times daily. For dry eyes   . polyethylene glycol (MIRALAX / GLYCOLAX) packet Take 17 g by mouth daily. Hold for diarrhea  . potassium chloride (K-DUR,KLOR-CON) 10 MEQ tablet Take 20 mEq by mouth daily.  Marland Kitchen PROAIR HFA 108 (90 BASE) MCG/ACT inhaler Inhale 2 puffs into the lungs 3 (three) times daily as needed for wheezing (cough).   . promethazine (PHENERGAN) 12.5 MG tablet Take 12.5 mg by mouth every 6 (six) hours as needed for nausea or vomiting.  . traMADol (ULTRAM) 50 MG tablet Take 1 tablet (50 mg total) by mouth 2 (two) times daily.  . [DISCONTINUED] ciprofloxacin (CIPRO) 250 MG tablet Take 250 mg by mouth 2 (two) times daily. Give for 7 days  . [DISCONTINUED] Menthol, Topical Analgesic, (BIOFREEZE) 4 % GEL Apply to sight of pain to knees twice a day  . [DISCONTINUED] Probiotic Product (RISA-BID PROBIOTIC) TABS Take 1 tablet by mouth twice a day   No facility-administered encounter medications on file as of 09/02/2017.     Allergies (verified) Sulfur   History: Past Medical History:  Diagnosis Date  . Atrial fibrillation ([place])   . Breast nodule 11/15/2014  . Breast pain, left 11/15/2014  . Carotid artery disease ([place])   . Dizziness   . Dyspnea    Previous CPX suggesting possible restrictive physiology, respiratory muscle  fatigue, diastolic dysfunction  . Essential hypertension, benign   . Hyperlipidemia   . Oxygen dependent    2 liter  . Rib pain on left side 11/15/2014  . Seasonal allergies   . Shingles 05/04/2015  . Toe fracture, left    left big toe   Past Surgical History:  Procedure Laterality Date  . COLONOSCOPY  02/22/2012   Procedure: COLONOSCOPY;  Surgeon: Rogene Houston, MD;  [facility];  Service: Endoscopy;  Laterality: N/A;  100  . KNEE SURGERY     bilateral  . PACEMAKER IMPLANT N/A 07/17/2017   Procedure: Pacemaker Implant;  Surgeon: Deboraha Sprang, MD;  Location: [facility];  Service: Cardiovascular;  Laterality: N/A;   Family History  Problem Relation Age of Onset   . Stroke Mother   . Pneumonia Father   . Heart disease Father   . Healthy Son   . Glaucoma Daughter   . Emphysema Daughter   . Healthy Daughter   . Other Daughter        had knee replacement  . Healthy Son   . Heart disease Maternal Grandmother    Social History   Occupational History  . Not on file.   Social History Main Topics  . Smoking status: Never Smoker  . Smokeless tobacco: Never Used  . Alcohol use No  . Drug use: No  . Sexual activity: No    Tobacco Counseling Counseling given: Not Answered   Activities of Daily Living In your present state of health, do you have any difficulty performing the following activities: 09/02/2017 07/18/2017  Hearing? Tempie Donning  Vision? Y N  Difficulty concentrating or making decisions? N N  Walking or climbing stairs? Y Y  Dressing or bathing? Y Y  Doing errands, shopping? Tempie Donning  Preparing Food and eating ? Y -  Using the Toilet? Y -  In the past six months, have you accidently leaked urine? N -  Do you have problems with loss of bowel control? N -  Managing your Medications? Y -  Managing your Finances? Y -  Housekeeping or managing your Housekeeping? Y -  Some recent data might be hidden    Immunizations and Health Maintenance Immunization History  Administered Date(s) Administered  . Influenza-Unspecified 10/10/2013   There are no preventive care reminders to display for this patient.  Patient Care Team: Virgie Dad, MD as PCP - General (Internal Medicine) Wille Celeste, PA-C as Physician Assistant (Internal Medicine)  Indicate any recent Medical Services you may have received from other than [facility] providers in the past year (date may be approximate).     Assessment:   This is a routine wellness examination for Wendy Perkins.   Hearing/Vision screen No exam data present  Dietary issues and exercise activities discussed: Current Exercise Habits: The patient does not participate in regular exercise at present;Home  exercise routine, Type of exercise: stretching;strength training/weights  Goals    None     Depression Screen PHQ 2/9 Scores 09/02/2017  PHQ - 2 Score 0    Fall Risk Fall Risk  09/02/2017  Falls in the past year? Yes  Number falls in past yr: 1  Injury with Fall? No    Cognitive Function:     6CIT Screen 09/02/2017  What Year? 4 points  What month? 3 points  What time? 0 points  Count back from 20 0 points  Months in reverse 4 points  Repeat phrase 2 points  Total Score 13    Screening Tests Health Maintenance  Topic Date Due  . INFLUENZA VACCINE  11/09/2017 (Originally 07/10/2017)  . DEXA SCAN  11/09/2017 (Originally [birth year]/1986)  . TETANUS/TDAP  11/09/2017 (Originally 01/16/1938)  . PNA vac Low Risk Adult (1 of 2 - PCV13) 11/09/2017 (Originally [birth year]/1986)      Plan:    I have personally reviewed and addressed the Medicare Annual Wellness questionnaire and have noted the following in the patient's chart:  A. Medical and social history B. Use of alcohol, tobacco or illicit drugs  C. Current medications and supplements D. Functional ability and status E.  Nutritional status F.  Physical activity G. Advance directives H. List of other physicians I.  Hospitalizations, surgeries, and ER visits in previous 12 months J.  [place] to include hearing, vision, cognitive, depression L. Referrals and appointments - none  In addition, I have reviewed and discussed with patient certain preventive protocols, quality metrics, and best practice recommendations. A written personalized care plan for preventive services as well as general preventive health recommendations were provided to patient.  See attached scanned questionnaire for additional information.   Signed,   Rich Reining, RN Nurse Health Advisor   Quick Notes   Health Maintenance: DEXA, TDAP, flu vaccine due     Abnormal Screen: 6 CIT-13     Patient Concerns: None     Nurse Concerns:  None

## 2017-09-03 DIAGNOSIS — M810 Age-related osteoporosis without current pathological fracture: Secondary | ICD-10-CM | POA: Diagnosis not present

## 2017-09-10 IMAGING — CT CT NECK W/O CM
3 of 5 series · 9 of 27 positions shown, 11 images · IV contrast (APPLIED)
Comparison: CT angiogram of the head and neck December 06, 2016

CLINICAL DATA: LEFT posterior neck pain beginning last night.
History of carotid artery disease.

EXAM:
CT NECK WITHOUT CONTRAST
TECHNIQUE: Multidetector CT imaging of the neck was performed following the
standard protocol without intravenous contrast.

[Series 3: axial neck · axial · 0.39mm/px · z∈[-272,-216]mm · 2 of 86 slices shown, 3 images]
[im 29/86  soft-tissue]
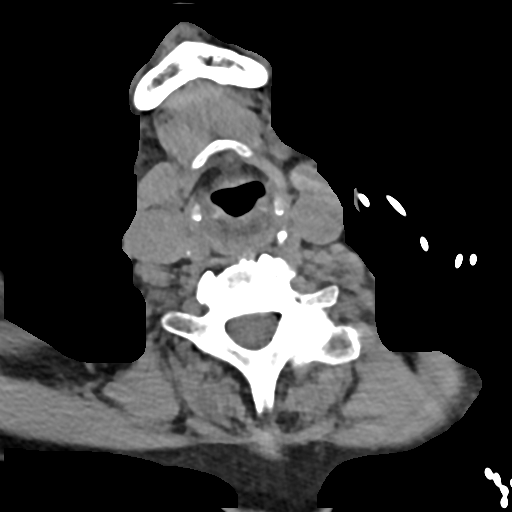
[im 29/86  bone]
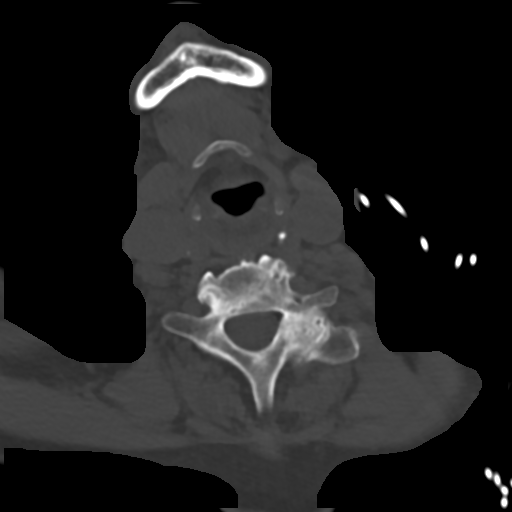
[im 57/86  bone]
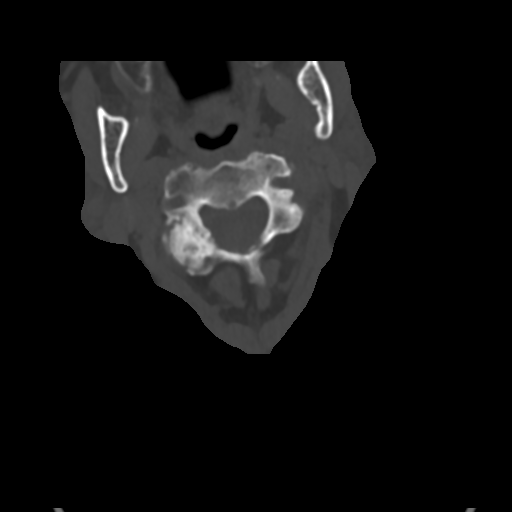

[Series 6: sag neck · sagittal · 0.35mm/px · 5 of 74 slices shown, 6 images]
[im 25/74  bone]
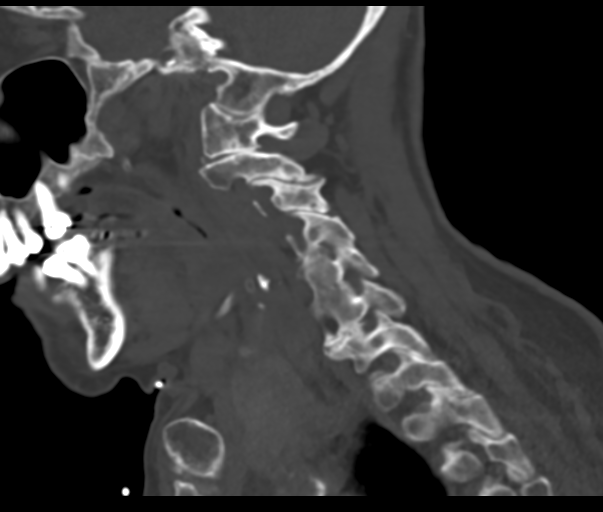
[im 31/74  bone]
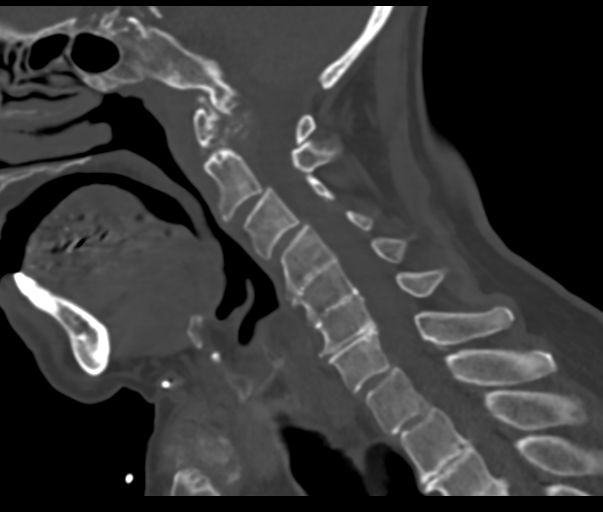
[im 37/74  soft-tissue]
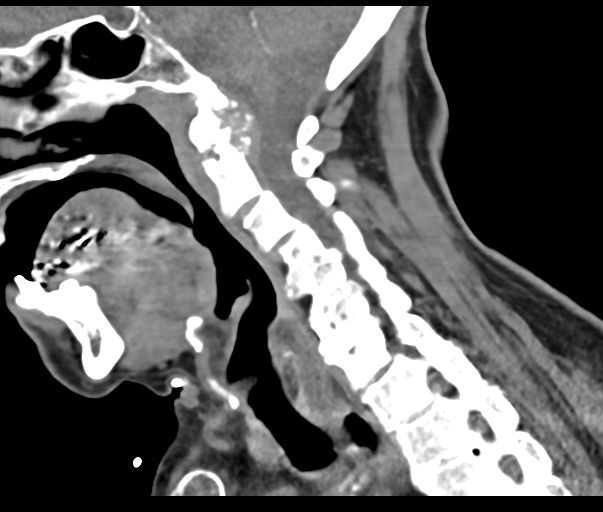
[im 37/74  bone]
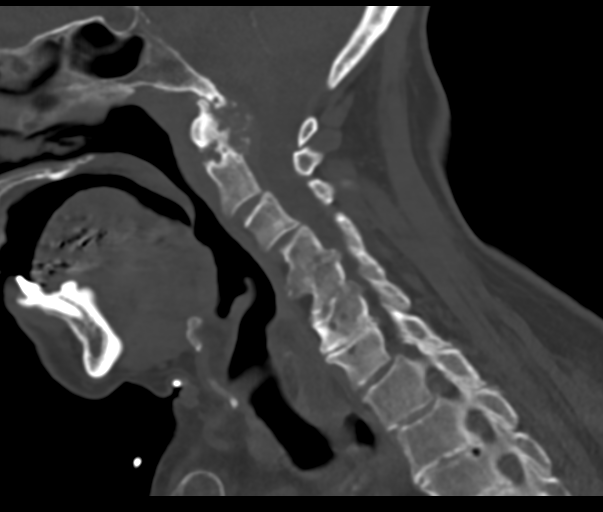
[im 43/74  bone]
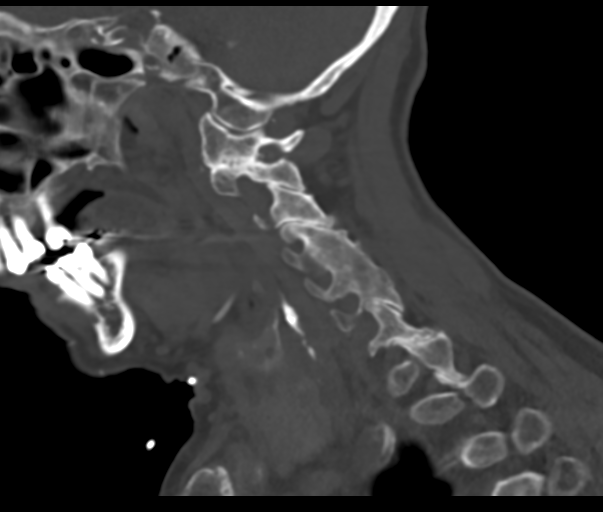
[im 49/74  bone]
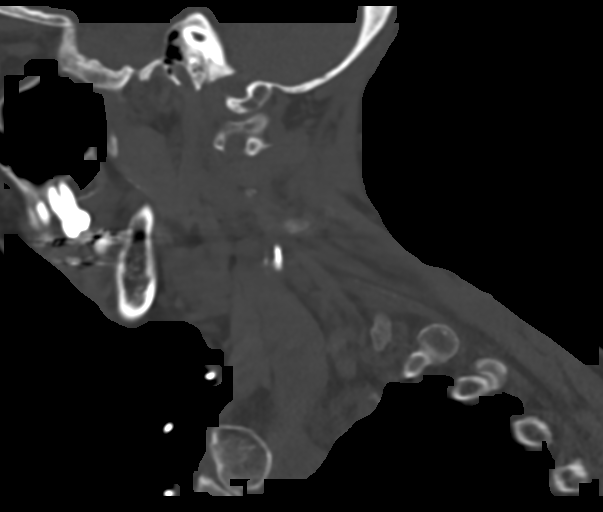

[Series 8: ax oropharynx · axial · 0.39mm/px · z∈[-331,-276]mm · 2 of 100 slices shown]
[im 34/100  bone]
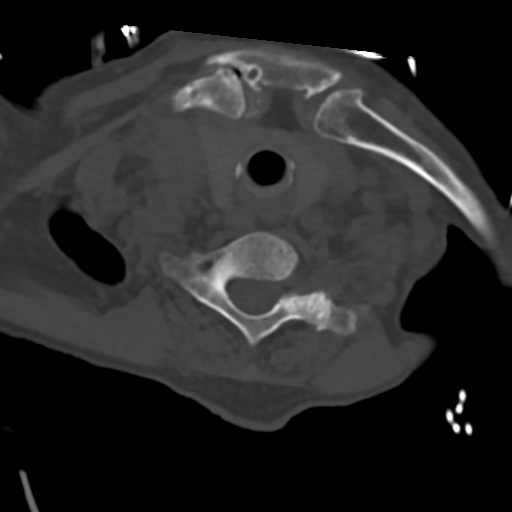
[im 67/100  bone]
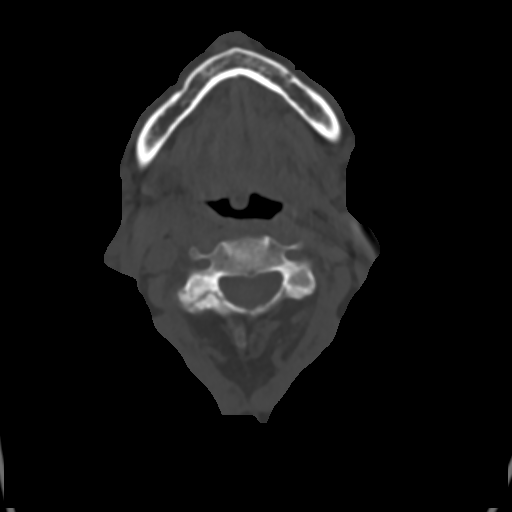

[9 of 27 positions shown; findings below may reference images not displayed]

FINDINGS: PHARYNX AND LARYNX: Normal.  Widely patent airway.

SALIVARY GLANDS: Normal.

THYROID: Normal.

LYMPH NODES: No lymphadenopathy by CT size criteria though,
sensitivity decreased without contrast.

VASCULAR: Moderate calcific atherosclerosis carotid bifurcations.

LIMITED INTRACRANIAL: Normal for age.

VISUALIZED ORBITS: Normal.

MASTOIDS AND VISUALIZED PARANASAL SINUSES: Well-aerated.

SKELETON: Nonacute. C4-5, C5-6 auto interbody arthrodesis. Unchanged
cyst 8 mm calcified pannus posterior to the odontoid process
consistent with CPPD. Stable moderate C3-4 central disc protrusion.
Multilevel severe neural foraminal narrowing.

UPPER CHEST: Lung apices are clear. No superior mediastinal
lymphadenopathy.

OTHER: None.
IMPRESSION: 1. No acute process in the neck. No specific findings to explain new
LEFT neck pain.
2. Degenerative change of the cervical spine resulting in multilevel
severe neural foraminal narrowing.

## 2017-09-11 IMAGING — DX DG CHEST 2V
2 series · 2 of 2 positions shown · non-contrast
Comparison: July 14, 2017

CLINICAL DATA: Cardiac arrhythmia with pacemaker placement.

EXAM:
CHEST  2 VIEW

[chest lat]
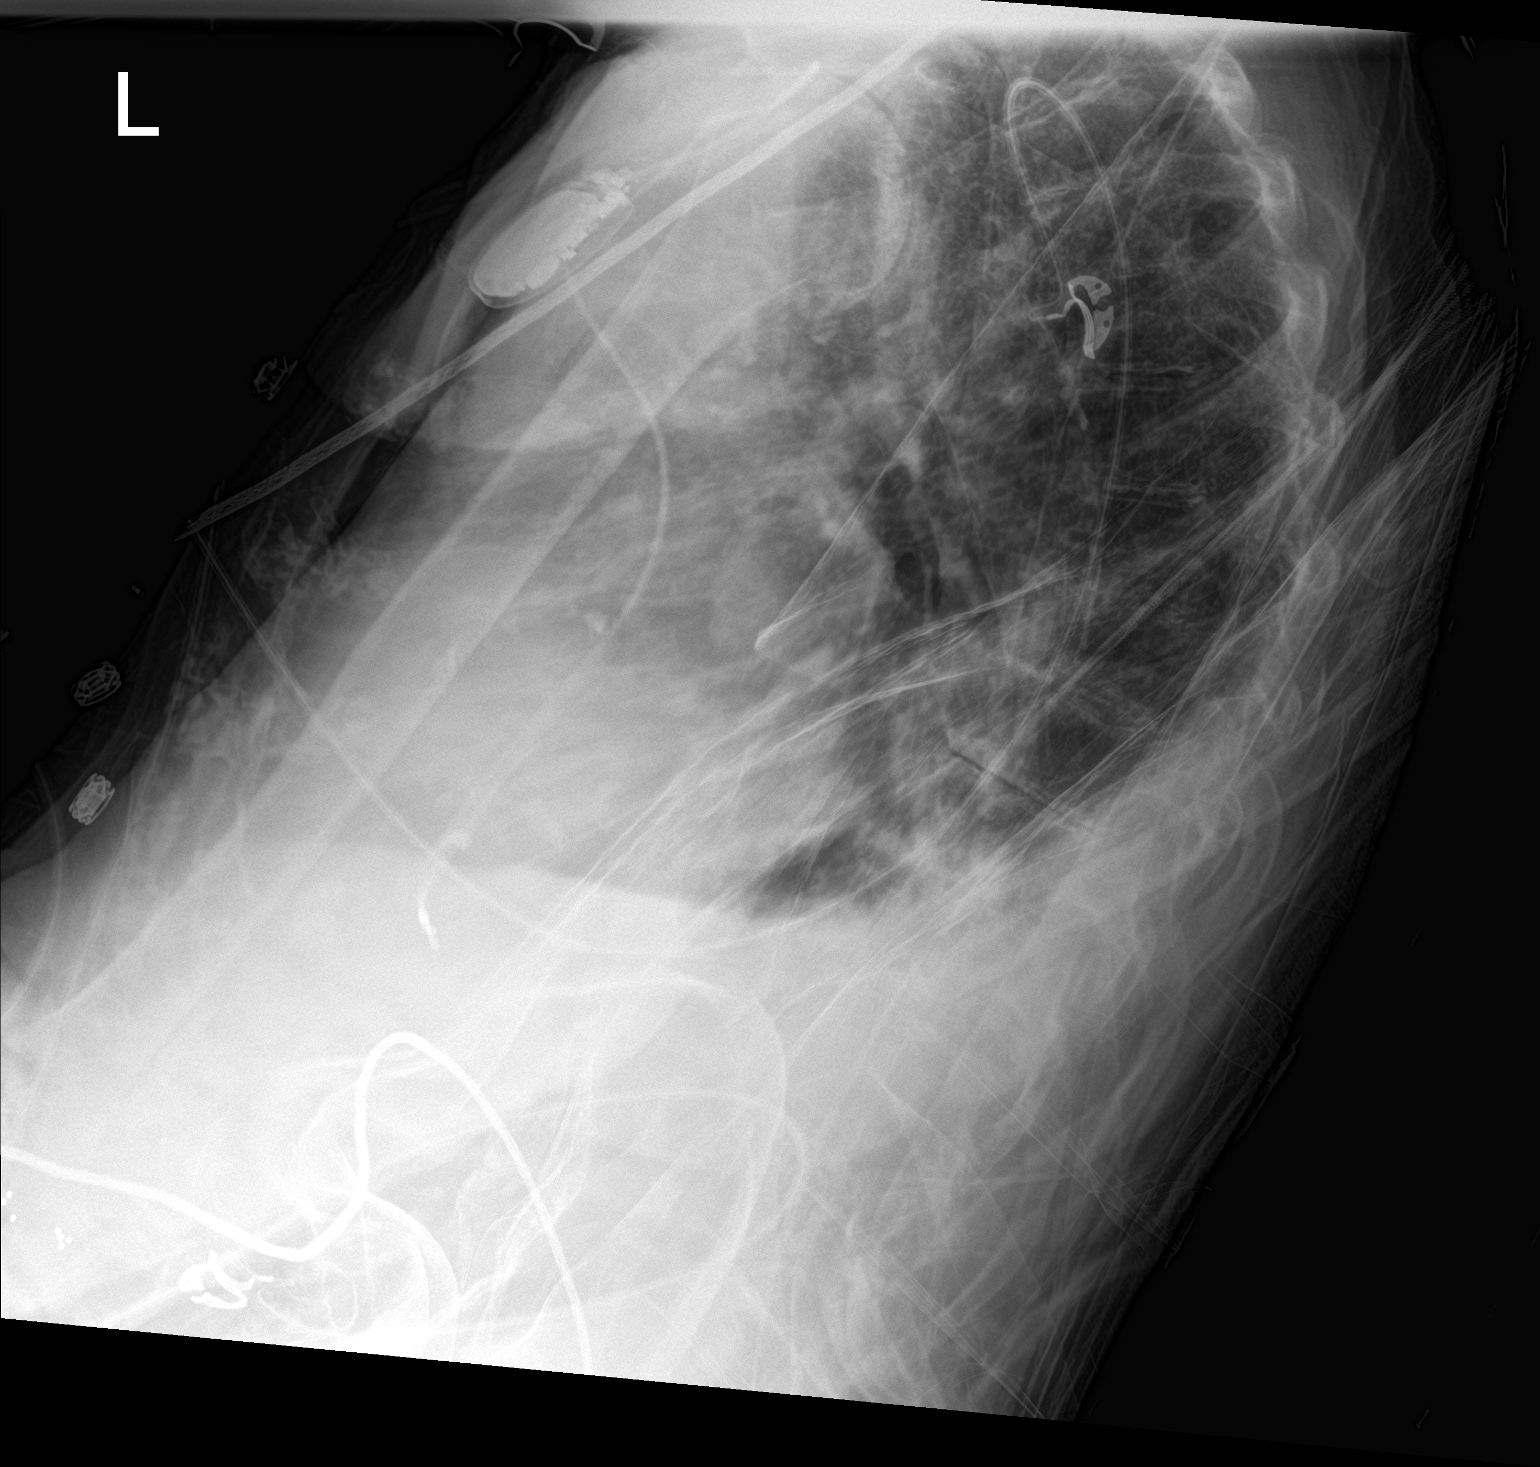

[chest ap]
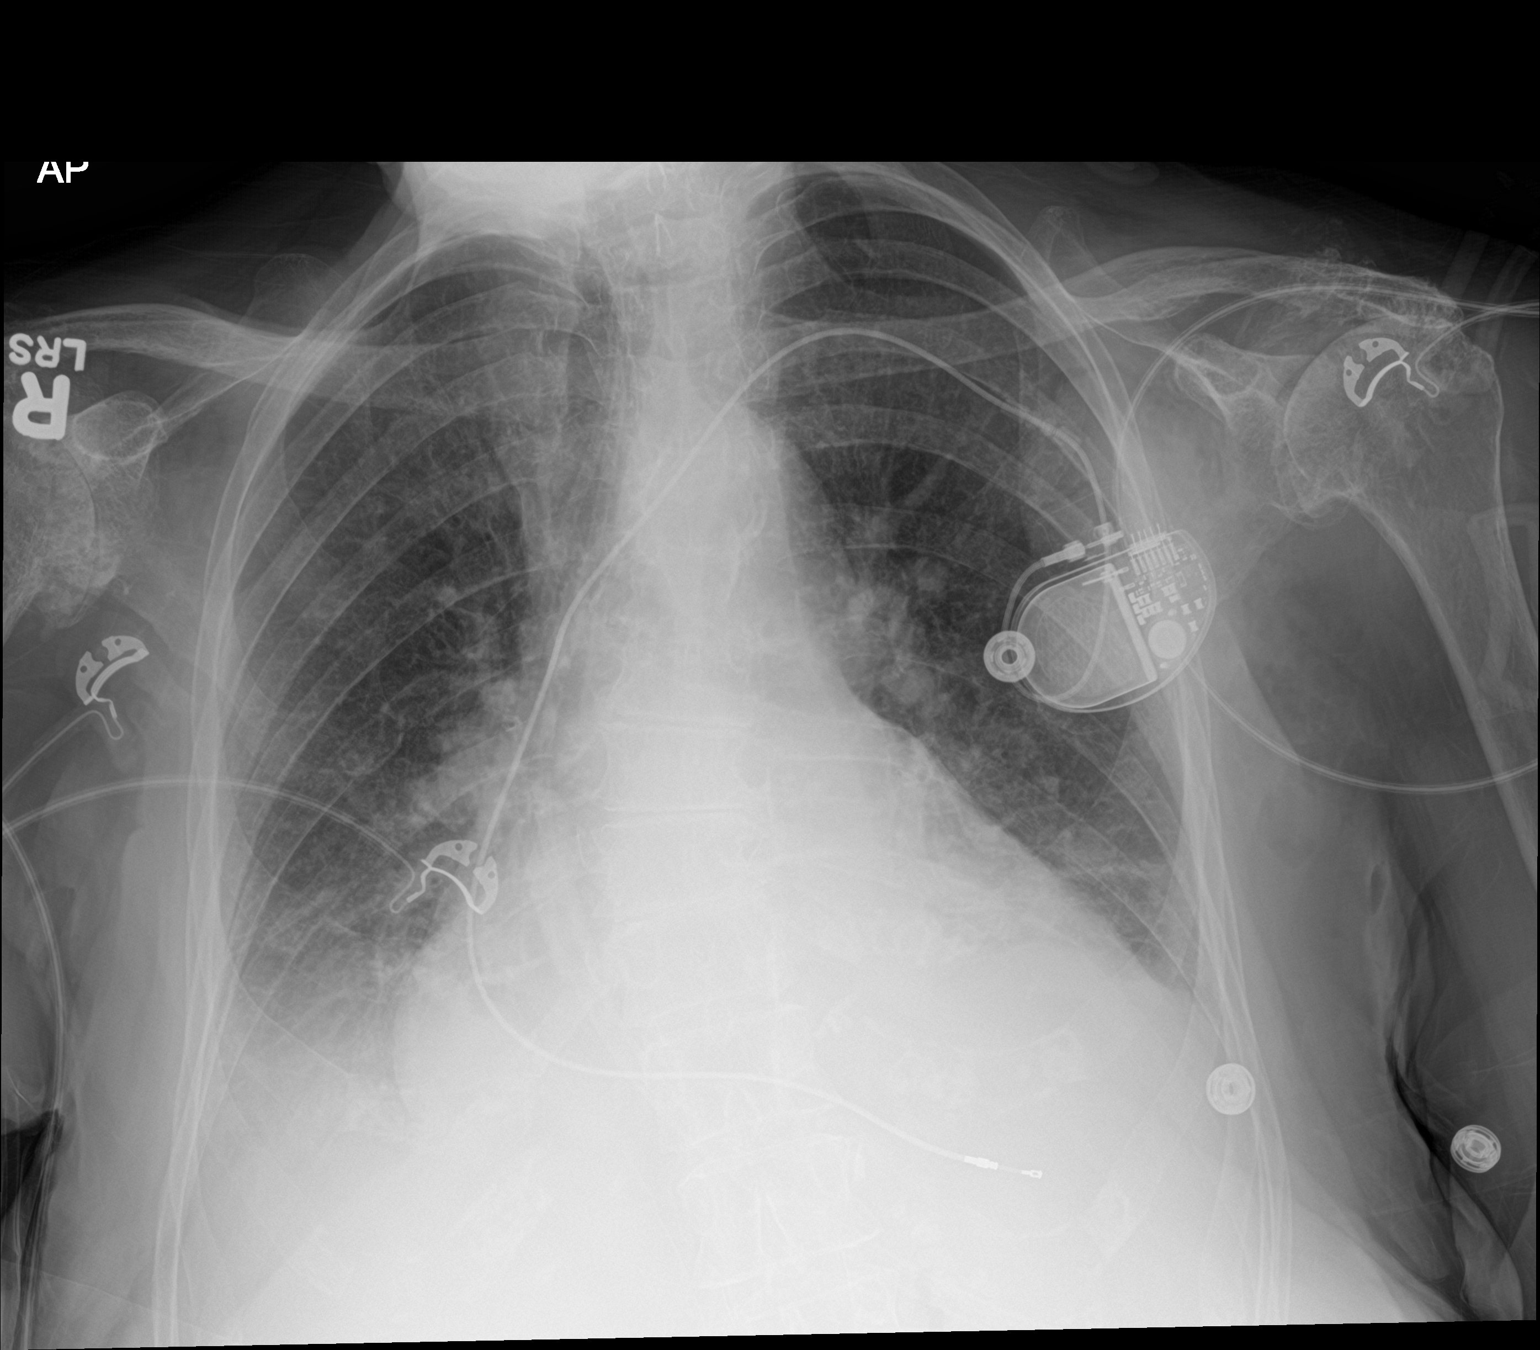

[2 of 2 positions shown; findings below may reference images not displayed]

FINDINGS: There is now a pacemaker present with lead tip in the middle cardiac
vein. No evident pneumothorax. There are pleural effusions
bilaterally with bibasilar atelectasis. There is trace interstitial
edema. There is cardiomegaly with pulmonary venous hypertension.
There is aortic atherosclerosis. No evident adenopathy. There is
extensive arthropathy in the thoracic spine and both shoulders.
There is thoracolumbar levoscoliosis.
IMPRESSION: Pacemaker with lead tip attached to the middle cardiac vein.
Evidence of a degree of congestive heart failure. There is aortic
atherosclerosis. No appreciable airspace consolidation.

Aortic Atherosclerosis ([id number]-XDV.V).

## 2017-09-20 ENCOUNTER — Emergency Department (HOSPITAL_COMMUNITY): Payer: Medicare Other

## 2017-09-20 ENCOUNTER — Non-Acute Institutional Stay (SKILLED_NURSING_FACILITY): Payer: Medicare Other | Admitting: Internal Medicine

## 2017-09-20 ENCOUNTER — Emergency Department (HOSPITAL_COMMUNITY)
Admission: EM | Admit: 2017-09-20 | Discharge: 2017-09-21 | Disposition: A | Discharge status: Home / Self Care | Payer: Medicare Other | Attending: Emergency Medicine | Admitting: Emergency Medicine

## 2017-09-20 ENCOUNTER — Encounter: Payer: Self-pay | Admitting: Internal Medicine

## 2017-09-20 ENCOUNTER — Encounter (HOSPITAL_COMMUNITY): Payer: Self-pay | Admitting: Emergency Medicine

## 2017-09-20 DIAGNOSIS — E785 Hyperlipidemia, unspecified: Secondary | ICD-10-CM | POA: Diagnosis not present

## 2017-09-20 DIAGNOSIS — Z950 Presence of cardiac pacemaker: Secondary | ICD-10-CM | POA: Diagnosis not present

## 2017-09-20 DIAGNOSIS — I480 Paroxysmal atrial fibrillation: Secondary | ICD-10-CM | POA: Diagnosis not present

## 2017-09-20 DIAGNOSIS — Z7901 Long term (current) use of anticoagulants: Secondary | ICD-10-CM | POA: Insufficient documentation

## 2017-09-20 DIAGNOSIS — I2510 Atherosclerotic heart disease of native coronary artery without angina pectoris: Secondary | ICD-10-CM | POA: Diagnosis not present

## 2017-09-20 DIAGNOSIS — I110 Hypertensive heart disease with heart failure: Secondary | ICD-10-CM | POA: Insufficient documentation

## 2017-09-20 DIAGNOSIS — R1030 Lower abdominal pain, unspecified: Secondary | ICD-10-CM | POA: Diagnosis not present

## 2017-09-20 DIAGNOSIS — I5033 Acute on chronic diastolic (congestive) heart failure: Secondary | ICD-10-CM | POA: Diagnosis not present

## 2017-09-20 DIAGNOSIS — I4891 Unspecified atrial fibrillation: Secondary | ICD-10-CM | POA: Insufficient documentation

## 2017-09-20 DIAGNOSIS — Z8673 Personal history of transient ischemic attack (TIA), and cerebral infarction without residual deficits: Secondary | ICD-10-CM | POA: Insufficient documentation

## 2017-09-20 DIAGNOSIS — R1084 Generalized abdominal pain: Secondary | ICD-10-CM

## 2017-09-20 DIAGNOSIS — I509 Heart failure, unspecified: Secondary | ICD-10-CM | POA: Diagnosis not present

## 2017-09-20 DIAGNOSIS — Z79899 Other long term (current) drug therapy: Secondary | ICD-10-CM | POA: Insufficient documentation

## 2017-09-20 DIAGNOSIS — R109 Unspecified abdominal pain: Secondary | ICD-10-CM | POA: Diagnosis not present

## 2017-09-20 DIAGNOSIS — R1032 Left lower quadrant pain: Secondary | ICD-10-CM | POA: Diagnosis not present

## 2017-09-20 LAB — COMPREHENSIVE METABOLIC PANEL
ALBUMIN: 4.1 g/dL (ref 3.5–5.0)
ALT: 19 U/L (ref 14–54)
ANION GAP: 13 (ref 5–15)
AST: 26 U/L (ref 15–41)
Alkaline Phosphatase: 139 U/L — ABNORMAL HIGH (ref 38–126)
BUN: 20 mg/dL (ref 6–20)
CO2: 27 mmol/L (ref 22–32)
Calcium: 9.3 mg/dL (ref 8.9–10.3)
Chloride: 100 mmol/L — ABNORMAL LOW (ref 101–111)
Creatinine, Ser: 0.93 mg/dL (ref 0.44–1.00)
GFR calc Af Amer: 58 mL/min — ABNORMAL LOW (ref >60)
GFR calc non Af Amer: 50 mL/min — ABNORMAL LOW (ref >60)
GLUCOSE: 137 mg/dL — AB (ref 65–99)
POTASSIUM: 4.6 mmol/L (ref 3.5–5.1)
SODIUM: 140 mmol/L (ref 135–145)
Total Bilirubin: 1 mg/dL (ref 0.3–1.2)
Total Protein: 7.3 g/dL (ref 6.5–8.1)

## 2017-09-20 LAB — URINALYSIS, ROUTINE W REFLEX MICROSCOPIC
BILIRUBIN URINE: NEGATIVE
Glucose, UA: NEGATIVE mg/dL
HGB URINE DIPSTICK: NEGATIVE
Ketones, ur: NEGATIVE mg/dL
Leukocytes, UA: NEGATIVE
Nitrite: NEGATIVE
PH: 5 (ref 5.0–8.0)
Protein, ur: NEGATIVE mg/dL
SPECIFIC GRAVITY, URINE: 1.009 (ref 1.005–1.030)

## 2017-09-20 LAB — CBC
HEMATOCRIT: 42 % (ref 36.0–46.0)
HEMOGLOBIN: 13.1 g/dL (ref 12.0–15.0)
MCH: 26.9 pg (ref 26.0–34.0)
MCHC: 31.2 g/dL (ref 30.0–36.0)
MCV: 86.2 fL (ref 78.0–100.0)
Platelets: 249 10*3/uL (ref 150–400)
RBC: 4.87 MIL/uL (ref 3.87–5.11)
RDW: 17.3 % — ABNORMAL HIGH (ref 11.5–15.5)
WBC: 7.8 10*3/uL (ref 4.0–10.5)

## 2017-09-20 LAB — LIPASE, BLOOD: Lipase: 27 U/L (ref 11–51)

## 2017-09-20 MED ORDER — IOPAMIDOL (ISOVUE-300) INJECTION 61%
100.0000 mL | Freq: Once | INTRAVENOUS | Status: AC | PRN
  Administered 2017-09-20: 100 mL via INTRAVENOUS

## 2017-09-20 MED ORDER — FENTANYL CITRATE (PF) 100 MCG/2ML IJ SOLN
50.0000 ug | Freq: Once | INTRAMUSCULAR | Status: AC
  Administered 2017-09-20: 50 ug via INTRAVENOUS
  Filled 2017-09-20: qty 2

## 2017-09-20 MED ORDER — SODIUM CHLORIDE 0.9 % IV BOLUS (SEPSIS)
500.0000 mL | Freq: Once | INTRAVENOUS | Status: AC
  Administered 2017-09-20: 500 mL via INTRAVENOUS

## 2017-09-20 NOTE — ED Triage Notes (Signed)
Pt c/o lower abd pain radiating to back. denies n/v/d. Denies gu sx.

## 2017-09-20 NOTE — ED Provider Notes (Signed)
[facility] DEPT Provider Note   CSN: [id number] Arrival date & time: 09/20/17  1810     History   Chief Complaint Chief Complaint  Patient presents with  . Abdominal Pain    HPI Wendy Perkins is a 83 y.o. female.  HPI Patient presents with one day of abdominal pain. As the pain is worse in the lower abdomen that radiates through to her back. No nausea or vomiting. Had normal bowel movement this morning. No blood in stool. No fever or chills. Seen by her primary physician and referred to the emergency department for evaluation. Past Medical History:  Diagnosis Date  . Atrial fibrillation ([employer])   . Breast nodule 11/15/2014  . Breast pain, left 11/15/2014  . Carotid artery disease ([place])   . Dizziness   . Dyspnea    Previous CPX suggesting possible restrictive physiology, respiratory muscle fatigue, diastolic dysfunction  . Essential hypertension, benign   . Hyperlipidemia   . Oxygen dependent    2 liter  . Rib pain on left side 11/15/2014  . Seasonal allergies   . Shingles 05/04/2015  . Toe fracture, left    left big toe    Patient Active Problem List   Diagnosis Date Noted  . Insomnia 07/26/2017  . Chronic anticoagulation 07/25/2017  . Sinus node dysfunction ([place]) 07/17/2017  . Complete heart block ([place]) 07/14/2017  . Bradycardia with 31-40 beats per minute 07/14/2017  . AKI (acute kidney injury) ([place])   . Ventricular escape rhythm   . Acute on chronic diastolic (congestive) heart failure ([place])   . Radicular pain of thoracic region 07/10/2017  . Bilateral leg edema 07/08/2017  . Hemorrhoids 06/03/2017  . Retinal vein occlusion of right eye 05/14/2017  . Internal carotid artery stenosis, bilateral 03/07/2017  . Back pain 03/07/2017  . Aphasia 12/06/2016  . Knee pain, acute 08/06/2016  . Fall 08/06/2016  . CVA (cerebral vascular accident) ([place]) 10/17/2015  . COPD (chronic obstructive pulmonary disease) ([place]) 10/17/2015  . UTI (urinary tract infection)  10/17/2015  . Altered mental state 10/13/2015  . Elevated troponin 10/13/2015  . Shingles 05/04/2015  . Rib pain on left side 11/15/2014  . Breast pain, left 11/15/2014  . Breast nodule 11/15/2014  . Orthostatic hypotension 09/30/2013  . Atrial fibrillation ([place]) 11/23/2011  . Chronic diastolic heart failure ([place]) 11/23/2011  . Essential hypertension 07/13/2010  . DYSPNEA 11/15/2009    Past Surgical History:  Procedure Laterality Date  . COLONOSCOPY  02/22/2012   Procedure: COLONOSCOPY;  Surgeon: Rogene Houston, MD;  [facility];  Service: Endoscopy;  Laterality: N/A;  100  . KNEE SURGERY     bilateral  . PACEMAKER IMPLANT N/A 07/17/2017   Procedure: Pacemaker Implant;  Surgeon: Deboraha Sprang, MD;  Location: [facility];  Service: Cardiovascular;  Laterality: N/A;    OB History    Gravida Para Term Preterm AB Living   4 4       4    SAB TAB Ectopic Multiple Live Births                   Home Medications    Prior to Admission medications   Medication Sig Start Date End Date Taking? Authorizing Provider  acetaminophen (TYLENOL) 500 MG tablet Take 1,000 mg by mouth 3 (three) times daily. Max of 3 grams acetaminophen per 24 hrs from all sources.   Yes [provider]  Celedonio Miyamoto 62.5-25 MCG/INH AEPB Inhale 1 Inhaler into the  lungs daily.  10/12/14  Yes [provider]  apixaban (ELIQUIS) 2.5 MG TABS tablet Take 1 tablet (2.5 mg total) by mouth 2 (two) times daily. Resume 07/20/17 07/19/17  Yes Seiler, Amber K, NP  atorvastatin (LIPITOR) 80 MG tablet Take 1 tablet (80 mg total) by mouth daily at 6 PM. 10/17/15  Yes Sinda Du, MD  cycloSPORINE (RESTASIS) 0.05 % ophthalmic emulsion Place 1 drop into both eyes every 12 (twelve) hours.    Yes [provider]  diclofenac sodium (VOLTAREN) 1 % GEL Apply 1 application topically 4 (four) times daily as needed.   Yes [provider]  diltiazem (CARDIZEM CD) 180 MG 24 hr capsule  TAKE ONE CAPSULE BY MOUTH ONCE DAILY. 05/10/15  Yes Satira Sark, MD  ferrous sulfate (KP FERROUS SULFATE) 325 (65 FE) MG tablet Take 325 mg by mouth daily with breakfast.   Yes [provider]  furosemide (LASIX) 20 MG tablet Take 3 tablets (60 mg total) by mouth daily. 07/10/17  Yes Orvan Falconer, MD  gabapentin (NEURONTIN) 100 MG capsule Take 1 capsule (100 mg total) by mouth 3 (three) times daily. 06/25/17  Yes Carole Civil, MD  hydrocortisone (ANUSOL-HC) 2.5 % rectal cream Place 1 application rectally 2 (two) times daily as needed for hemorrhoids or anal itching.   Yes [provider]  Melatonin 5 MG TABS Take 5 mg by mouth at bedtime as needed.   Yes [provider]  Multiple Vitamin (MULITIVITAMIN WITH MINERALS) TABS Take 1 tablet by mouth every other day.    Yes [provider]  omeprazole (PRILOSEC) 20 MG capsule Take 20 mg by mouth daily.     Yes [provider]  OXYGEN Inhale 2 L into the lungs 2 (two) times daily. 3:15pm to 11:15pm 11:15pm to 07:15am   Yes [provider]  Polyethyl Glycol-Propyl Glycol (SYSTANE OP) Place 1 drop into both eyes 3 (three) times daily. For dry eyes   Yes [provider]  polyethylene glycol (MIRALAX / GLYCOLAX) packet Take 17 g by mouth daily. Hold for diarrhea   Yes [provider]  potassium chloride (K-DUR,KLOR-CON) 10 MEQ tablet Take 20 mEq by mouth daily.   Yes [provider]  PROAIR HFA 108 (90 BASE) MCG/ACT inhaler Inhale 2 puffs into the lungs 3 (three) times daily as needed for wheezing (cough).  05/13/15  Yes [provider]  promethazine (PHENERGAN) 12.5 MG tablet Take 12.5 mg by mouth every 6 (six) hours as needed for nausea or vomiting.   Yes [provider]  traMADol (ULTRAM) 50 MG tablet Take 1 tablet (50 mg total) by mouth 2 (two) times daily. 07/24/17  Yes Wille Celeste, PA-C    Family History Family History  Problem Relation Age of  Onset  . Stroke Mother   . Pneumonia Father   . Heart disease Father   . Healthy Son   . Glaucoma Daughter   . Emphysema Daughter   . Healthy Daughter   . Other Daughter        had knee replacement  . Healthy Son   . Heart disease Maternal Grandmother     Social History Social History  Substance Use Topics  . Smoking status: Never Smoker  . Smokeless tobacco: Never Used  . Alcohol use No     Allergies   Sulfur   Review of Systems Review of Systems  Constitutional: Negative for chills and fever.  Respiratory: Negative for cough and shortness of  breath.   Cardiovascular: Negative for chest pain.  Gastrointestinal: Positive for abdominal pain. Negative for constipation, diarrhea, nausea and vomiting.  Genitourinary: Negative for difficulty urinating, dysuria, flank pain, frequency and hematuria.  Musculoskeletal: Positive for back pain. Negative for myalgias, neck pain and neck stiffness.  Skin: Negative for rash and wound.  Neurological: Negative for dizziness, weakness, light-headedness, numbness and headaches.  All other systems reviewed and are negative.    Physical Exam Updated Vital Signs BP (!) 182/76   Pulse 62   Temp 98.3 F (36.8 C)   Resp 16   Ht 5\' 4"  (1.626 m)   Wt 64.4 kg (142 lb)   SpO2 98%   BMI 24.37 kg/m   Physical Exam  Constitutional: She is oriented to person, place, and time. She appears well-developed and well-nourished. No distress.  HENT:  Head: Normocephalic and atraumatic.  Mouth/Throat: Oropharynx is clear and moist. No oropharyngeal exudate.  Eyes: Pupils are equal, round, and reactive to light. EOM are normal.  Neck: Normal range of motion. Neck supple.  Cardiovascular: Normal rate and regular rhythm.  Exam reveals no gallop and no friction rub.   No murmur heard. Pulmonary/Chest: Effort normal and breath sounds normal. No respiratory distress. She has no wheezes. She has no rales. She exhibits no tenderness.  Abdominal: Soft.  Bowel sounds are normal. There is tenderness. There is no rebound and no guarding.  Diffuse abdominal tenderness appears to be most pronounced in the left lower quadrant. Questionable rebound tenderness. No guarding.  Musculoskeletal: Normal range of motion. She exhibits no edema or tenderness.  No CVA tenderness. No midline thoracic or lumbar tenderness.   Neurological: She is alert and oriented to person, place, and time.  Moving all extremities without deficit. Sensation intact.  Skin: Skin is warm and dry. Capillary refill takes less than 2 seconds. No rash noted. No erythema.  Psychiatric: She has a normal mood and affect. Her behavior is normal.  Nursing note and vitals reviewed.    ED Treatments / Results  Labs (all labs ordered are listed, but only abnormal results are displayed) Labs Reviewed  COMPREHENSIVE METABOLIC PANEL - Abnormal; Notable for the following:       Result Value   Chloride 100 (*)    Glucose, Bld 137 (*)    Alkaline Phosphatase 139 (*)    GFR calc non Af Amer 50 (*)    GFR calc Af Amer 58 (*)    All other components within normal limits  CBC - Abnormal; Notable for the following:    RDW 17.3 (*)    All other components within normal limits  URINALYSIS, ROUTINE W REFLEX MICROSCOPIC - Abnormal; Notable for the following:    Color, Urine STRAW (*)    All other components within normal limits  LIPASE, BLOOD    EKG  EKG Interpretation  Date/Time:  Friday September 20 2017 18:27:54 EDT Ventricular Rate:  62 PR Interval:    QRS Duration: 152 QT Interval:  486 QTC Calculation: 493 R Axis:   -80 Text Interpretation:  Ventricular-paced rhythm with occasional Premature ventricular complexes Abnormal ECG Confirmed by Julianne Rice [phone]) on 09/20/2017 8:56:08 PM       Radiology Ct Abdomen Pelvis W Contrast  Result Date: 09/20/2017 CLINICAL DATA:  Sharp constant lower bilateral abdominal pain radiating to the back. EXAM: CT ABDOMEN AND PELVIS WITH  CONTRAST TECHNIQUE: Multidetector CT imaging of the abdomen and pelvis was performed using the standard protocol following bolus administration of intravenous  contrast. CONTRAST:  149mL ISOVUE-300 IOPAMIDOL (ISOVUE-300) INJECTION 61% COMPARISON:  07/08/2017 FINDINGS: Lower chest: Enlarged heart. Mitral valve calcifications. Calcific atherosclerotic disease of the aorta. Hepatobiliary: Mildly nodular hepatic contour. Calcified hepatic granuloma type. Postcholecystectomy. Common bile duct is prominent measuring 12 mm. Pancreas: Enlargement of the main pancreatic duct which measures 4.5 mm. Spleen: Normal in size without focal abnormality. Adrenals/Urinary Tract: Adrenal glands are unremarkable. Kidneys are normal, without renal calculi, focal lesion, or hydronephrosis. Bladder is unremarkable. Stomach/Bowel: Stomach is within normal limits. Again seen is a duodenal diverticulum. No evidence of small-bowel obstruction. Extensive colonic diverticulosis without evidence of diverticulitis. Vascular/Lymphatic: Aortic atherosclerosis. Extensive calcified and noncalcified plaque of the aorta and branching vessels, however the named abdominal aortic branches are patent. No enlarged abdominal or pelvic lymph nodes. Reproductive: Status post hysterectomy. No adnexal masses. Other: No abdominal wall hernia or abnormality. No abdominopelvic ascites. 14 mm subcutaneous nodule along the right lateral abdominal wall, image 33/86. Musculoskeletal: Mild levoconvex scoliosis of the lumbosacral spine. Multilevel osteoarthritic changes. Minimal anterolisthesis of L4 on L5 and L5 on S1. Advanced posterior facet arthropathy in the lower lumbosacral spine. IMPRESSION: Post cholecystectomy. Mild enlargement of the extrahepatic common bile duct. Mild enlargement of the main pancreatic duct. Given these findings, obstruction at the level of the ampulla cannot be excluded. Advanced calcific atherosclerotic disease of the aorta and its branches,  however the named branches of the abdominal aorta are patent. Extensive colonic diverticulosis without evidence of diverticulitis. Persistent nodular contour of the liver. Electronically Signed   By: Fidela Salisbury M.D.   On: 09/20/2017 22:51    Procedures Procedures (including critical care time)  Medications Ordered in ED Medications  sodium chloride 0.9 % bolus 500 mL (500 mLs Intravenous New Bag/Given 09/20/17 2142)  fentaNYL (SUBLIMAZE) injection 50 mcg (50 mcg Intravenous Given 09/20/17 2142)  iopamidol (ISOVUE-300) 61 % injection 100 mL (100 mLs Intravenous Contrast Given 09/20/17 2213)     Initial Impression / Assessment and Plan / ED Course  I have reviewed the triage vital signs and the nursing notes.  Pertinent labs & imaging results that were available during my care of the patient were reviewed by me and considered in my medical decision making (see chart for details).  Clinical Course as of Sep 20 2325  Fri Sep 20, 2017  2301 Alkaline Phosphatase: (!) 139 [DY]    Clinical Course User Index [DY] Julianne Rice, MD    Abdominal pain is improved. Mild dilation of pancreatic duct and common bile duct on CT. Discussed findings with both patient and patient's son. Will need to follow-up with gastroenterology for possible further testing. Return precautions given.  Final Clinical Impressions(s) / ED Diagnoses   Final diagnoses:  Generalized abdominal pain    New Prescriptions New Prescriptions   No medications on file     Julianne Rice, MD 09/20/17 2326

## 2017-09-20 NOTE — ED Notes (Signed)
Patient transported to CT 

## 2017-09-20 NOTE — Progress Notes (Signed)
Location:   [facility] Room Number: 156/W Place of Service:  SNF [phone]) Provider:  Freddi Starr, MD  Patient Care Team: Virgie Dad, MD as PCP - General (Internal Medicine) Rolm Baptise as Physician Assistant (Internal Medicine)  Extended Emergency Contact Information Primary Emergency Contact: Adin Hector, [place] [address] [name] of [place] Phone: [phone] Relation: Son Secondary Emergency Contact: [name], [place] [address] [name] of [place] Phone: [phone] Mobile Phone: [phone] Relation: Son  Code Status:  DNR Goals of care: Advanced Directive information Advanced Directives 09/20/2017  Does Patient Have a Medical Advance Directive? Yes  Type of Advance Directive Out of facility DNR (pink MOST or yellow form)  Does patient want to make changes to medical advance directive? No - Patient declined  Copy of [place] in Chart? No - copy requested  Would patient like information on creating a medical advance directive? No - Patient declined  Pre-existing out of facility DNR order (yellow form or pink MOST form) -     Chief Complaint  Patient presents with  . Acute Visit    Stomach Ache    HPI:  Pt is a 83 y.o. female seen today for an acute visit for Complaints of stomach pain.  She has a complicated history including history of atrial fibrillation with heart block status post pacemaker-diastolic CHF-history CVA-hypertension-and chronic knee and back pain.  Recently was hospitalized for CHF and was diuresed also found to have a heart block and received a pacemaker appears to be doing well with this.  She also has a history CVA with some expressive aphasia-as well as anemia on iron and hemoglobin has been stable recently most recently 12.9 September.  She also has a history of hemorrhoids  Thought . Not be a good candidate for banding  secondary to her being on anticoagulation and advanced age.  Regards to stomach pain she states that she's had this for several days but has gotten worse this afternoon.  This appears to be more generalized although at times she says it is more in the right upper quadrant.  She is not complaining of any vomiting or nausea at this time.  She did have a CT scan of her abdomen in July 2018 which showed extensive colonic diverticulosis without evidence of diverticulitis also mildly nodular hepatic contour questionable cirrhosis.  Possibly small element of ascites.  CT scan also showed  status post cholecystectomy with no intrahepatic or extrahepatic duct dilation  Appendix was not discretely visualized  History is somewhat vague here-- initially she complained of fairly acute abdominal pain but then described this more as mild soreness but then later described it as fairly acute pain again--- on last evaluation she complained more soreness rather than acute pain but said the pain was radiating to her back  Her blood pressure is elevated with a systolic at 160.     Past Medical History:  Diagnosis Date  . Atrial fibrillation ([place])   . Breast nodule 11/15/2014  . Breast pain, left 11/15/2014  . Carotid artery disease ([place])   . Dizziness   . Dyspnea    Previous CPX suggesting possible restrictive physiology, respiratory muscle fatigue, diastolic dysfunction  . Essential hypertension, benign   . Hyperlipidemia   . Oxygen dependent    2 liter  . Rib pain on left  side 11/15/2014  . Seasonal allergies   . Shingles 05/04/2015  . Toe fracture, left    left big toe   Past Surgical History:  Procedure Laterality Date  . COLONOSCOPY  02/22/2012   Procedure: COLONOSCOPY;  Surgeon: Rogene Houston, MD;  Location: [facility];  Service: Endoscopy;  Laterality: N/A;  100  . KNEE SURGERY     bilateral  . PACEMAKER IMPLANT N/A 07/17/2017   Procedure: Pacemaker Implant;  Surgeon: Deboraha Sprang, MD;  Location: [facility];  Service: Cardiovascular;  Laterality: N/A;    Allergies  Allergen Reactions  . Sulfur Swelling and Rash    Outpatient Encounter Prescriptions as of 09/20/2017  Medication Sig  . acetaminophen (TYLENOL) 500 MG tablet Take 1,000 mg by mouth 3 (three) times daily. Max of 3 grams acetaminophen per 24 hrs from all sources.  Jearl Klinefelter ELLIPTA 62.5-25 MCG/INH AEPB Inhale 1 Inhaler into the lungs daily.   Marland Kitchen apixaban (ELIQUIS) 2.5 MG TABS tablet Take 1 tablet (2.5 mg total) by mouth 2 (two) times daily. Resume 07/20/17  . atorvastatin (LIPITOR) 80 MG tablet Take 1 tablet (80 mg total) by mouth daily at 6 PM.  . cycloSPORINE (RESTASIS) 0.05 % ophthalmic emulsion Place 1 drop into both eyes every 12 (twelve) hours.   . diclofenac sodium (VOLTAREN) 1 % GEL Apply 1 application topically 4 (four) times daily as needed.  . diltiazem (CARDIZEM CD) 180 MG 24 hr capsule TAKE ONE CAPSULE BY MOUTH ONCE DAILY.  . ferrous sulfate (KP FERROUS SULFATE) 325 (65 FE) MG tablet Take 325 mg by mouth daily with breakfast.  . furosemide (LASIX) 20 MG tablet Take 3 tablets (60 mg total) by mouth daily.  Marland Kitchen gabapentin (NEURONTIN) 100 MG capsule Take 1 capsule (100 mg total) by mouth 3 (three) times daily.  . hydrocortisone (ANUSOL-HC) 2.5 % rectal cream Place 1 application rectally 2 (two) times daily as needed for hemorrhoids or anal itching.  . Melatonin 5 MG TABS Take 5 mg by mouth at bedtime as needed.  . Multiple Vitamin (MULITIVITAMIN WITH MINERALS) TABS Take 1 tablet by mouth every other day.   Marland Kitchen omeprazole (PRILOSEC) 20 MG capsule Take 20 mg by mouth daily.    . OXYGEN Inhale 2 L into the lungs 2 (two) times daily. 3:15pm to 11:15pm 11:15pm to 07:15am  . Polyethyl Glycol-Propyl Glycol (SYSTANE OP) Place 1 drop into both eyes 3 (three) times daily. For dry eyes  . polyethylene glycol (MIRALAX / GLYCOLAX) packet Take 17 g by mouth daily. Hold for diarrhea  . potassium chloride  (K-DUR,KLOR-CON) 10 MEQ tablet Take 20 mEq by mouth daily.  Marland Kitchen PROAIR HFA 108 (90 BASE) MCG/ACT inhaler Inhale 2 puffs into the lungs 3 (three) times daily as needed for wheezing (cough).   . promethazine (PHENERGAN) 12.5 MG tablet Take 12.5 mg by mouth every 6 (six) hours as needed for nausea or vomiting.  . traMADol (ULTRAM) 50 MG tablet Take 1 tablet (50 mg total) by mouth 2 (two) times daily.   No facility-administered encounter medications on file as of 09/20/2017.     Review of Systems   In general she does not complaining of any fever or chills but says her stomach hurts.  Skin does not complain of diaphoresis or itching.  Ears eyes nose mouth and throat does not really complain of visual changes she has prescription lenses does have a history of retinal artery occlusion followed lab for mild G.  Respiratory  is not really complaining of shortness of breath or cough at this time.  Cardiac does not complain of chest pain she says the pain is more lower abdominal area does have somewhat chronic lower extremity edema.  GI is complaining of somewhat diffuse abdominal pain at times appears to be more in the right upper quadrant per her report she does not report any nausea or vomiting since she had a good bowel movement earlier today.-On last interrogation she did state had  pain  radiating to her back  GU does not complaining of dysuria.  Musculoskeletal does have a history of chronic knee and back pain but is not really complaining of that currently.  Neurologic does not complain of dizziness headache or syncope. Does have some expressive aphasia  Psych appears to be a bit anxious but once her son was in the room appeared to be a bit calmer-she was pleasant and appropriate  Immunization History  Administered Date(s) Administered  . Influenza-Unspecified 10/10/2013   Pertinent  Health Maintenance Due  Topic Date Due  . INFLUENZA VACCINE  11/09/2017 (Originally 07/10/2017)  . DEXA  SCAN  11/09/2017 (Originally 01/15/1986)  . PNA vac Low Risk Adult (1 of 2 - PCV13) 11/09/2017 (Originally 01/15/1986)   Fall Risk  09/02/2017  Falls in the past year? Yes  Number falls in past yr: 1  Injury with Fall? No   Functional Status Survey:   Temperature is 97.6 pulse 60 respirations 20 blood pressure taken manually 190/80.  In general this is a pleasant elderly female. Mildly anxious at first complaining of some abdominal discomfort appeared to be calmer when I later reevaluated her.  Her skin is warm and dry.  Eyes pupils appear reactive light sclerae and conjunctivae are clear she has prescription lenses.  Her oropharynx clear mucous membranes moist.  Heart is largely regular rate and rhythm with an occasional irregular beat she has always say baseline lower extremity edema 1+.  Chest is clear to auscultation there is no labored breathing.  Her abdomen is soft with positive bowel sounds has somewhat diffuse tenderness possibly a bit more in the right upper quadrant although this is somewhat difficult to localize.  I could not really appreciate any guarding.  Muscle skeletal does move all extremities 4 with significant arthritic changes of her knees bilaterally ambulates in a wheelchair.  Neurologic cranial nerves appear grossly intact continues with expressive aphasia but can communicate quite well.  Psych she is alert and oriented pleasant and appropriate.     Physical Exam As noted above  Labs reviewed:  Recent Labs  07/15/17 0805  07/22/17 0700 07/25/17 0800 08/20/17 0706  NA 134*  < > 135 135 138  K 5.5*  < > 4.4 4.4 4.0  CL 103  < > 103 99* 103  CO2 19*  < > 25 26 25   GLUCOSE 94  < > 90 106* 101*  BUN 53*  < > 25* 21* 25*  CREATININE 1.52*  < > 0.86 0.83 0.77  CALCIUM 8.7*  < > 8.4* 8.9 9.1  MG 2.3  --   --   --   --   < > = values in this interval not displayed.  Recent Labs  07/08/17 1641 07/09/17 0705 07/14/17 1719  AST 33 25 36  ALT 25  22 30   ALKPHOS 134* 119 125  BILITOT 0.8 0.7 0.6  PROT 6.8 6.1* 5.9*  ALBUMIN 3.6 3.2* 3.2*    Recent Labs  07/22/17 0700 07/25/17  0800 08/20/17 0706  WBC 6.7 5.7 7.3  NEUTROABS 3.8 3.1 4.2  HGB 10.1* 11.4* 12.9  HCT 32.1* 37.1 40.5  MCV 86.5 87.5 85.8  PLT 275 349 226   Lab Results  Component Value Date   TSH 1.446 07/15/2017   Lab Results  Component Value Date   HGBA1C 5.5 12/06/2016   Lab Results  Component Value Date   CHOL 98 06/04/2017   HDL 50 06/04/2017   LDLCALC 40 06/04/2017   TRIG 40 06/04/2017   CHOLHDL 2.0 06/04/2017    Significant Diagnostic Results in last 30 days:  No results found.  Assessment/Plan  #1 abdominal pain unknown etiology the history here is fairly sketchy first complain of somewhat acute abdominal pain  Then later described as soreness-I did discuss options including obtaining labs here the facility and an abdominal x-ray-her son is at bedside as well-there was concerne however about the timeliness of getting the x-ray-will send her to the ER for evaluation of the abdominal discomfort since family would like an expedient workup-clinically at this point she does not appear to be in any distress-however her blood pressure is elevated from baseline which may indicate certain level pain.  Again will await ER evaluation.--Of note she is on diltiazem for her atrial fibrillation which appears rate controlled continues on  Eliquis for anticoagulation  Regards to CHF it appears per chart review her weight has been stable at around the 140 pounds she is on Lasix  With  potassium supplementation.      [id number]

## 2017-09-21 ENCOUNTER — Inpatient Hospital Stay
Admission: RE | Admit: 2017-09-21 | Discharge: 2021-12-20 | Disposition: A | Discharge status: Nursing Home (Includes ICF) | Payer: Medicare Other | Source: Ambulatory Visit | Attending: Internal Medicine | Admitting: Internal Medicine

## 2017-09-21 DIAGNOSIS — M47812 Spondylosis without myelopathy or radiculopathy, cervical region: Secondary | ICD-10-CM

## 2017-09-21 DIAGNOSIS — M542 Cervicalgia: Principal | ICD-10-CM

## 2017-09-23 ENCOUNTER — Non-Acute Institutional Stay (SKILLED_NURSING_FACILITY): Payer: Medicare Other | Admitting: Internal Medicine

## 2017-09-23 ENCOUNTER — Encounter: Payer: Self-pay | Admitting: Internal Medicine

## 2017-09-23 DIAGNOSIS — I5032 Chronic diastolic (congestive) heart failure: Secondary | ICD-10-CM

## 2017-09-23 DIAGNOSIS — R1084 Generalized abdominal pain: Secondary | ICD-10-CM | POA: Diagnosis not present

## 2017-09-23 DIAGNOSIS — I480 Paroxysmal atrial fibrillation: Secondary | ICD-10-CM | POA: Diagnosis not present

## 2017-09-23 DIAGNOSIS — I442 Atrioventricular block, complete: Secondary | ICD-10-CM

## 2017-09-23 NOTE — Progress Notes (Addendum)
Location:   [facility] Room Number: 156/W Place of Service:  SNF [phone]) Provider:  Kyung Rudd, Rene Kocher, MD  Patient Care Team: Virgie Dad, MD as PCP - General (Internal Medicine) Rolm Baptise as Physician Assistant (Internal Medicine)  Extended Emergency Contact Information Primary Emergency Contact: Adin Hector, [place] [address] [name] of [place] Phone: [phone] Relation: Son Secondary Emergency Contact: [name], [place] [address] [name] of [place] Phone: [phone] Mobile Phone: [phone] Relation: Son  Code Status:  DNR Goals of care: Advanced Directive information Advanced Directives 09/23/2017  Does Patient Have a Medical Advance Directive? Yes  Type of Advance Directive Out of facility DNR (pink MOST or yellow form)  Does patient want to make changes to medical advance directive? No - Patient declined  Copy of [place] in Chart? No - copy requested  Would patient like information on creating a medical advance directive? No - Patient declined  Pre-existing out of facility DNR order (yellow form or pink MOST form) -     Chief Complaint  Patient presents with  . Acute Visit    F/U from ED visit for Abdominal Pain    HPI:  Pt is a 83 y.o. female seen today for an acute visit for Follow up From ED for Abdominal Pain. Patient has h/o Atrial fibrillation on Eliquis, S/P Acute CVA involving left MCA ,ICA stenosis,Diastolic CHF, hyperlipidemia, And Hypertension And Arthritis with Chronic Knee and Back Pain.right retinal vein occlusion. Heart Block.S/p Single Chamber Pacemaker.  Patient was send to the ED on 10/12 For Acute onset of Abdominal Pain. Her CT scan in the ED showed Mild enlargement of the extrahepatic common bile duct. Mild enlargement of the main pancreatic duct.  Her Alk  Phos was Mildly Elevated also to 139. It was d/w the Patient and  her son to follow that as outpatient. Patient says her Pain is resolved now. She denies any Constipation , Nausea or vomiting. Eating well. No Constipation. No fever.    Past Medical History:  Diagnosis Date  . Atrial fibrillation ([place])   . Breast nodule 11/15/2014  . Breast pain, left 11/15/2014  . Carotid artery disease ([place])   . Dizziness   . Dyspnea    Previous CPX suggesting possible restrictive physiology, respiratory muscle fatigue, diastolic dysfunction  . Essential hypertension, benign   . Hyperlipidemia   . Oxygen dependent    2 liter  . Rib pain on left side 11/15/2014  . Seasonal allergies   . Shingles 05/04/2015  . Toe fracture, left    left big toe   Past Surgical History:  Procedure Laterality Date  . COLONOSCOPY  02/22/2012   Procedure: COLONOSCOPY;  Surgeon: Rogene Houston, MD;  [facility];  Service: Endoscopy;  Laterality: N/A;  100  . KNEE SURGERY     bilateral  . PACEMAKER IMPLANT N/A 07/17/2017   Procedure: Pacemaker Implant;  Surgeon: Deboraha Sprang, MD;  Location: [facility];  Service: Cardiovascular;  Laterality: N/A;    Allergies  Allergen Reactions  . Sulfur Swelling and Rash    Allergies as of 09/23/2017      Reactions   Sulfur Swelling, Rash      Medication List       Accurate as of 09/23/17  4:29 PM. Always use your most recent med  list.          acetaminophen 500 MG tablet Commonly known as:  TYLENOL Take 1,000 mg by mouth 3 (three) times daily. Max of 3 grams acetaminophen per 24 hrs from all sources.   ANORO ELLIPTA 62.5-25 MCG/INH Aepb Generic drug:  umeclidinium-vilanterol Inhale 1 Inhaler into the lungs daily.   apixaban 2.5 MG Tabs tablet Commonly known as:  ELIQUIS Take 1 tablet (2.5 mg total) by mouth 2 (two) times daily. Resume 07/20/17   atorvastatin 80 MG tablet Commonly known as:  LIPITOR Take 1 tablet (80 mg total) by mouth daily at 6 PM.   cycloSPORINE 0.05 % ophthalmic emulsion Commonly  known as:  RESTASIS Place 1 drop into both eyes every 12 (twelve) hours.   diclofenac sodium 1 % Gel Commonly known as:  VOLTAREN Apply 1 application topically 4 (four) times daily as needed.   diltiazem 180 MG 24 hr capsule Commonly known as:  CARDIZEM CD TAKE ONE CAPSULE BY MOUTH ONCE DAILY.   furosemide 20 MG tablet Commonly known as:  LASIX Take 3 tablets (60 mg total) by mouth daily.   gabapentin 100 MG capsule Commonly known as:  NEURONTIN Take 1 capsule (100 mg total) by mouth 3 (three) times daily.   hydrocortisone 2.5 % rectal cream Commonly known as:  ANUSOL-HC Place 1 application rectally 2 (two) times daily as needed for hemorrhoids or anal itching.   KP FERROUS SULFATE 325 (65 FE) MG tablet Generic drug:  ferrous sulfate Take 325 mg by mouth daily with breakfast.   Melatonin 5 MG Tabs Take 5 mg by mouth at bedtime as needed.   multivitamin with minerals Tabs tablet Take 1 tablet by mouth every other day.   omeprazole 20 MG capsule Commonly known as:  PRILOSEC Take 20 mg by mouth daily.   OXYGEN Inhale 2 L into the lungs 2 (two) times daily. 3:15pm to 11:15pm 11:15pm to 07:15am   polyethylene glycol packet Commonly known as:  MIRALAX / GLYCOLAX Take 17 g by mouth daily. Hold for diarrhea   potassium chloride 10 MEQ tablet Commonly known as:  K-DUR,KLOR-CON Take 20 mEq by mouth daily.   PROAIR HFA 108 (90 Base) MCG/ACT inhaler Generic drug:  albuterol Inhale 2 puffs into the lungs 3 (three) times daily as needed for wheezing (cough).   promethazine 12.5 MG tablet Commonly known as:  PHENERGAN Take 12.5 mg by mouth every 6 (six) hours as needed for nausea or vomiting.   SYSTANE OP Place 1 drop into both eyes 3 (three) times daily. For dry eyes   traMADol 50 MG tablet Commonly known as:  ULTRAM Take 1 tablet (50 mg total) by mouth 2 (two) times daily.       Review of Systems  Review of Systems  Constitutional: Negative for activity change,  appetite change, chills, diaphoresis, fatigue and fever.  HENT: Negative for mouth sores, postnasal drip, rhinorrhea, sinus pain and sore throat.   Respiratory: Negative for apnea, cough, chest tightness, shortness of breath and wheezing.   Cardiovascular: Negative for chest pain, palpitations and leg swelling.  Gastrointestinal: Negative for abdominal distention, abdominal pain, constipation, diarrhea, nausea and vomiting.  Genitourinary: Negative for dysuria and frequency.  Musculoskeletal: Negative for arthralgias, joint swelling and myalgias.  Skin: Negative for rash.  Neurological: Negative for dizziness, syncope, weakness, light-headedness and numbness.  Psychiatric/Behavioral: Negative for behavioral problems, confusion and sleep disturbance.     Immunization History  Administered Date(s) Administered  . Influenza-Unspecified 10/10/2013     Pertinent  Health Maintenance Due  Topic Date Due  . INFLUENZA VACCINE  11/09/2017 (Originally 07/10/2017)  . DEXA SCAN  11/09/2017 (Originally 01/15/1986)  . PNA vac Low Risk Adult (1 of 2 - PCV13) 11/09/2017 (Originally 01/15/1986)   Fall Risk  09/02/2017  Falls in the past year? Yes  Number falls in past yr: 1  Injury with Fall? No   Functional Status Survey:    Vitals:   09/23/17 1358  BP: 110/76  Pulse: 76  Resp: 18  Temp: 98 F (36.7 C)  TempSrc: Oral  SpO2: 98%  Weight: 140 lb (63.5 kg)   Body mass index is 24.03 kg/m. Physical Exam  Constitutional: She is oriented to person, place, and time. She appears well-developed and well-nourished.  HENT:  Mouth/Throat: Oropharynx is clear and moist.  Eyes: Pupils are equal, round, and reactive to light.  Neck: Neck supple.  Cardiovascular: Normal rate, regular rhythm and normal heart sounds.   No murmur heard. Pulmonary/Chest: Effort normal and breath sounds normal. No respiratory distress. She has no wheezes. She has no rales.  Abdominal: Soft. Bowel sounds are normal. She  exhibits no distension. There is no tenderness. There is no rebound.  Musculoskeletal:  Has moderate edema Bilateral  Neurological: She is alert and oriented to person, place, and time.  Skin: Skin is warm and dry.  Psychiatric: She has a normal mood and affect. Her behavior is normal. Thought content normal.    Labs reviewed:  Recent Labs  07/15/17 0805  07/25/17 0800 08/20/17 0706 09/20/17 1930  NA 134*  < > 135 138 140  K 5.5*  < > 4.4 4.0 4.6  CL 103  < > 99* 103 100*  CO2 19*  < > 26 25 27  GLUCOSE 94  < > 106* 101* 137*  BUN 53*  < > 21* 25* 20  CREATININE 1.52*  < > 0.83 0.77 0.93  CALCIUM 8.7*  < > 8.9 9.1 9.3  MG 2.3  --   --   --   --   < > = values in this interval not displayed.  Recent Labs  07/09/17 0705 07/14/17 1719 09/20/17 1930  AST 25 36 26  ALT 22 30 19  ALKPHOS 119 125 139*  BILITOT 0.7 0.6 1.0  PROT 6.1* 5.9* 7.3  ALBUMIN 3.2* 3.2* 4.1    Recent Labs  07/22/17 0700 07/25/17 0800 08/20/17 0706 09/20/17 1930  WBC 6.7 5.7 7.3 7.8  NEUTROABS 3.8 3.1 4.2  --   HGB 10.1* 11.4* 12.9 13.1  HCT 32.1* 37.1 40.5 42.0  MCV 86.5 87.5 85.8 86.2  PLT 275 349 226 249   Lab Results  Component Value Date   TSH 1.446 07/15/2017   Lab Results  Component Value Date   HGBA1C 5.5 12/06/2016   Lab Results  Component Value Date   CHOL 98 06/04/2017   HDL 50 06/04/2017   LDLCALC 40 06/04/2017   TRIG 40 06/04/2017   CHOLHDL 2.0 06/04/2017    Significant Diagnostic Results in last 30 days:  Ct Abdomen Pelvis W Contrast  Result Date: 09/20/2017 CLINICAL DATA:  Sharp constant lower bilateral abdominal pain radiating to the back. EXAM: CT ABDOMEN AND PELVIS WITH CONTRAST TECHNIQUE: Multidetector CT imaging of the abdomen and pelvis was performed using the standard protocol following bolus administration of intravenous contrast. CONTRAST:  100mL ISOVUE-300 IOPAMIDOL (ISOVUE-300) INJECTION 61% COMPARISON:  07/08/2017 FINDINGS: Lower chest: Enlarged  heart. Mitral valve calcifications. Calcific atherosclerotic disease of the   aorta. Hepatobiliary: Mildly nodular hepatic contour. Calcified hepatic granuloma type. Postcholecystectomy. Common bile duct is prominent measuring 12 mm. Pancreas: Enlargement of the main pancreatic duct which measures 4.5 mm. Spleen: Normal in size without focal abnormality. Adrenals/Urinary Tract: Adrenal glands are unremarkable. Kidneys are normal, without renal calculi, focal lesion, or hydronephrosis. Bladder is unremarkable. Stomach/Bowel: Stomach is within normal limits. Again seen is a duodenal diverticulum. No evidence of small-bowel obstruction. Extensive colonic diverticulosis without evidence of diverticulitis. Vascular/Lymphatic: Aortic atherosclerosis. Extensive calcified and noncalcified plaque of the aorta and branching vessels, however the named abdominal aortic branches are patent. No enlarged abdominal or pelvic lymph nodes. Reproductive: Status post hysterectomy. No adnexal masses. Other: No abdominal wall hernia or abnormality. No abdominopelvic ascites. 14 mm subcutaneous nodule along the right lateral abdominal wall, image 33/86. Musculoskeletal: Mild levoconvex scoliosis of the lumbosacral spine. Multilevel osteoarthritic changes. Minimal anterolisthesis of L4 on L5 and L5 on S1. Advanced posterior facet arthropathy in the lower lumbosacral spine. IMPRESSION: Post cholecystectomy. Mild enlargement of the extrahepatic common bile duct. Mild enlargement of the main pancreatic duct. Given these findings, obstruction at the level of the ampulla cannot be excluded. Advanced calcific atherosclerotic disease of the aorta and its branches, however the named branches of the abdominal aorta are patent. Extensive colonic diverticulosis without evidence of diverticulitis. Persistent nodular contour of the liver. Electronically Signed   By: Fidela Salisbury M.D.   On: 09/20/2017 22:51    Assessment/Plan  Abdominal Pain  With Mildly Enlargement of  CBD and Pancreatic Duct. Patient is asymptomatic now. Will d/w the son if he wants to take her to [facility] for Follow up. Will repeat CMP to follow Alk phos. Don't think patient is candidate for any Aggressive intervention. But will d/w son. S/P Pacemaker  Patient stable and has followed with cardiology. Chronic atrial fibrillation  Rate is controlled On Diltiazem and Eliquis  Chronic diastolic heart failure  On increased dose of  Lasix. Doing well. Renal function stable She has Edema in her legs but her weight is stable. D/W nurse to keep it elevated.  Essential hypertension BP controlled. No Change  Internal carotid artery stenosis, bilateral Patient Has decided to just continue Eliquis and No Aggressive treatment  S/P Cerebrovascular accident with mild Dysphasia Doing well on Eliquis .   Hemorrhoids Patient has not had any more episodes of rectal bleed.  GI had decided not to do any banding procedure due to her age and Eliquis. Will discontinue Iron  Insomnia She has agreed to try Melatonin as needed. Back and Joint pain Controlled on Ultram.  Family/ staff Communication: Addendum On 10/07/2017 D/W Son. He initially had refused for anymore work up but he came back today and said his family wants her to see Dr Laural Golden for further Evaluation of her Dilated ducts. Will send her for referral. Labs/tests ordered:  CMP Total time spent in this patient care encounter was _45 minutes; greater than 50% of the visit spent counseling patient, reviewing records , Labs and coordinating care for problems addressed at this encounter.

## 2017-09-24 ENCOUNTER — Other Ambulatory Visit (HOSPITAL_COMMUNITY)
Admission: RE | Admit: 2017-09-24 | Discharge: 2017-09-24 | Disposition: A | Discharge status: Home / Self Care | Payer: Medicare Other | Source: Skilled Nursing Facility | Attending: Pediatrics | Admitting: Pediatrics

## 2017-09-24 ENCOUNTER — Other Ambulatory Visit: Payer: Self-pay

## 2017-09-24 DIAGNOSIS — I442 Atrioventricular block, complete: Secondary | ICD-10-CM | POA: Diagnosis not present

## 2017-09-24 DIAGNOSIS — I63312 Cerebral infarction due to thrombosis of left middle cerebral artery: Secondary | ICD-10-CM | POA: Diagnosis not present

## 2017-09-24 DIAGNOSIS — K625 Hemorrhage of anus and rectum: Secondary | ICD-10-CM | POA: Diagnosis not present

## 2017-09-24 DIAGNOSIS — Z950 Presence of cardiac pacemaker: Secondary | ICD-10-CM | POA: Diagnosis not present

## 2017-09-24 LAB — COMPREHENSIVE METABOLIC PANEL
ALT: 15 U/L (ref 14–54)
ANION GAP: 12 (ref 5–15)
AST: 22 U/L (ref 15–41)
Albumin: 3.7 g/dL (ref 3.5–5.0)
Alkaline Phosphatase: 134 U/L — ABNORMAL HIGH (ref 38–126)
BUN: 25 mg/dL — AB (ref 6–20)
CALCIUM: 8.8 mg/dL — AB (ref 8.9–10.3)
CHLORIDE: 101 mmol/L (ref 101–111)
CO2: 24 mmol/L (ref 22–32)
CREATININE: 1.01 mg/dL — AB (ref 0.44–1.00)
GFR calc non Af Amer: 45 mL/min — ABNORMAL LOW (ref >60)
GFR, EST AFRICAN AMERICAN: 53 mL/min — AB (ref >60)
GLUCOSE: 97 mg/dL (ref 65–99)
Potassium: 4.6 mmol/L (ref 3.5–5.1)
SODIUM: 137 mmol/L (ref 135–145)
Total Bilirubin: 0.8 mg/dL (ref 0.3–1.2)
Total Protein: 6.7 g/dL (ref 6.5–8.1)

## 2017-09-24 NOTE — Patient Outreach (Signed)
[facility]) Care Management  09/24/2017  NISHTHA RAIDER 10-13-1919 [id number]   Medication Adherence call to Mrs. Dutch Quint the reason for this call is because Mrs. Rishel is showing past due under [facility] Ins.on her Atorvastatin 80 mg patient is at a nursing home at this time [facility] is providing her medication .  [facility] Management Direct Dial [phone]  Fax [phone] [email or web address]

## 2017-10-08 DIAGNOSIS — E785 Hyperlipidemia, unspecified: Secondary | ICD-10-CM | POA: Insufficient documentation

## 2017-10-08 DIAGNOSIS — M1990 Unspecified osteoarthritis, unspecified site: Secondary | ICD-10-CM | POA: Insufficient documentation

## 2017-10-09 DIAGNOSIS — B351 Tinea unguium: Secondary | ICD-10-CM | POA: Diagnosis not present

## 2017-10-09 DIAGNOSIS — I739 Peripheral vascular disease, unspecified: Secondary | ICD-10-CM | POA: Diagnosis not present

## 2017-10-14 ENCOUNTER — Encounter: Payer: Self-pay | Admitting: Internal Medicine

## 2017-10-14 ENCOUNTER — Non-Acute Institutional Stay (SKILLED_NURSING_FACILITY): Payer: Medicare Other | Admitting: Internal Medicine

## 2017-10-14 DIAGNOSIS — I10 Essential (primary) hypertension: Secondary | ICD-10-CM | POA: Diagnosis not present

## 2017-10-14 DIAGNOSIS — I5032 Chronic diastolic (congestive) heart failure: Secondary | ICD-10-CM | POA: Diagnosis not present

## 2017-10-14 DIAGNOSIS — J449 Chronic obstructive pulmonary disease, unspecified: Secondary | ICD-10-CM

## 2017-10-14 DIAGNOSIS — K8689 Other specified diseases of pancreas: Secondary | ICD-10-CM | POA: Diagnosis not present

## 2017-10-14 DIAGNOSIS — I442 Atrioventricular block, complete: Secondary | ICD-10-CM

## 2017-10-14 DIAGNOSIS — I639 Cerebral infarction, unspecified: Secondary | ICD-10-CM | POA: Diagnosis not present

## 2017-10-14 DIAGNOSIS — I480 Paroxysmal atrial fibrillation: Secondary | ICD-10-CM

## 2017-10-14 NOTE — Progress Notes (Signed)
Location:   [facility] Room Number: 156/W Place of Service:  SNF [phone]) Provider:  Kyung Rudd, Rene Kocher, MD  Patient Care Team: Virgie Dad, MD as PCP - General (Internal Medicine) Rolm Baptise as Physician Assistant (Internal Medicine)  Extended Emergency Contact Information Primary Emergency Contact: Adin Hector, [place] [address] [name] of [place] Phone: [phone] Relation: Son Secondary Emergency Contact: [name], [place] [address] [name] of [place] Phone: [phone] Mobile Phone: [phone] Relation: Son  Code Status:  DNR Goals of care: Advanced Directive information Advanced Directives 09/23/2017  Does Patient Have a Medical Advance Directive? Yes  Type of Advance Directive Out of facility DNR (pink MOST or yellow form)  Does patient want to make changes to medical advance directive? No - Patient declined  Copy of [place] in Chart? No - copy requested  Would patient like information on creating a medical advance directive? No - Patient declined  Pre-existing out of facility DNR order (yellow form or pink MOST form) -     Chief Complaint  Patient presents with  . Medical Management of Chronic Issues    Routine Visit    HPI:  Pt is a 83 y.o. female seen today for medical management of chronic diseases.   Patient has h/o Atrial fibrillation on Eliquis, S/P Acute CVA involving left MCA ,ICA stenosis,Diastolic CHF, hyperlipidemia, Hypertension , Arthritis with Chronic Knee and Back Pain.right retinal vein occlusion. Heart Block.S/p Single Chamber Pacemaker. Patient had Abdominal CT scan on 09/20/17 in the ED  for abdominal Pain and it showed Mild enlargement of the extrahepatic common bile duct. Mild enlargement of the main pancreatic duct. Given these findings, obstruction at the level of the ampulla cannot be Excluded. It was d/w the Son and  he wants her to follow with Dr Laural Golden. Patient continues to have occasional pain in her right side reflecting back to her Back. Denies any Nausea or vomiting. Her appetite stays good. Her weight is stable at 138 lbs.No new Nursing issues.  Past Medical History:  Diagnosis Date  . Atrial fibrillation ([place])   . Breast nodule 11/15/2014  . Breast pain, left 11/15/2014  . Carotid artery disease ([place])   . Dizziness   . Dyspnea    Previous CPX suggesting possible restrictive physiology, respiratory muscle fatigue, diastolic dysfunction  . Essential hypertension, benign   . Hyperlipidemia   . Oxygen dependent    2 liter  . Rib pain on left side 11/15/2014  . Seasonal allergies   . Shingles 05/04/2015  . Toe fracture, left    left big toe   Past Surgical History:  Procedure Laterality Date  . KNEE SURGERY     bilateral    Allergies  Allergen Reactions  . Sulfur Swelling and Rash    Outpatient Encounter Medications as of 10/14/2017  Medication Sig  . acetaminophen (TYLENOL) 500 MG tablet Take 1,000 mg by mouth 3 (three) times daily. Max of 3 grams acetaminophen per 24 hrs from all sources.  Jearl Klinefelter ELLIPTA 62.5-25 MCG/INH AEPB Inhale 1 Inhaler into the lungs daily.   Marland Kitchen apixaban (ELIQUIS) 2.5 MG TABS tablet Take 1 tablet (2.5 mg total) by mouth 2 (two) times daily. Resume 07/20/17  . atorvastatin (LIPITOR) 80 MG tablet Take 1 tablet (80 mg total) by mouth daily at 6 PM.  . cycloSPORINE (RESTASIS)  0.05 % ophthalmic emulsion Place 1 drop into both eyes every 12 (twelve) hours.   . diclofenac sodium (VOLTAREN) 1 % GEL Apply 1 application topically 4 (four) times daily as needed.  . diltiazem (CARDIZEM CD) 180 MG 24 hr capsule TAKE ONE CAPSULE BY MOUTH ONCE DAILY.  . furosemide (LASIX) 20 MG tablet Take 3 tablets (60 mg total) by mouth daily.  Marland Kitchen gabapentin (NEURONTIN) 100 MG capsule Take 1 capsule (100 mg total) by mouth 3 (three) times daily.  . hydrocortisone (ANUSOL-HC) 2.5 % rectal cream  Place 1 application rectally 2 (two) times daily as needed for hemorrhoids or anal itching.  . Melatonin 5 MG TABS Take 5 mg by mouth at bedtime as needed.  . Multiple Vitamin (MULITIVITAMIN WITH MINERALS) TABS Take 1 tablet by mouth every other day.   Marland Kitchen omeprazole (PRILOSEC) 20 MG capsule Take 20 mg by mouth daily.    . OXYGEN Inhale 2 L into the lungs 2 (two) times daily. 3:15pm to 11:15pm 11:15pm to 07:15am  . Polyethyl Glycol-Propyl Glycol (SYSTANE OP) Place 1 drop into both eyes 3 (three) times daily. For dry eyes  . polyethylene glycol (MIRALAX / GLYCOLAX) packet Take 17 g by mouth daily. Hold for diarrhea  . potassium chloride (K-DUR,KLOR-CON) 10 MEQ tablet Take 20 mEq by mouth daily.  Marland Kitchen PROAIR HFA 108 (90 BASE) MCG/ACT inhaler Inhale 2 puffs into the lungs 3 (three) times daily as needed for wheezing (cough).   . promethazine (PHENERGAN) 12.5 MG tablet Take 12.5 mg by mouth every 6 (six) hours as needed for nausea or vomiting.  . traMADol (ULTRAM) 50 MG tablet Take 1 tablet (50 mg total) by mouth 2 (two) times daily.  . [DISCONTINUED] ferrous sulfate (KP FERROUS SULFATE) 325 (65 FE) MG tablet Take 325 mg by mouth daily with breakfast.   No facility-administered encounter medications on file as of 10/14/2017.      Review of Systems  Review of Systems  Constitutional: Negative for activity change, appetite change, chills, diaphoresis, fatigue and fever.  HENT: Negative for mouth sores, postnasal drip, rhinorrhea, sinus pain and sore throat.   Respiratory: Negative for apnea, cough, chest tightness, shortness of breath and wheezing.   Cardiovascular: Negative for chest pain, palpitations and leg swelling.  Gastrointestinal: Negative for abdominal distention, abdominal pain, constipation, diarrhea, nausea and vomiting.  Genitourinary: Negative for dysuria and frequency.  Musculoskeletal: positive for  arthralgias, joint swelling and myalgias.  Skin: Negative for rash.  Neurological:  Negative for dizziness, syncope, weakness, light-headedness and numbness.  Psychiatric/Behavioral: Negative for behavioral problems, confusion and sleep disturbance.     Immunization History  Administered Date(s) Administered  . Influenza-Unspecified 10/10/2013   Pertinent  Health Maintenance Due  Topic Date Due  . INFLUENZA VACCINE  11/09/2017 (Originally 07/10/2017)  . DEXA SCAN  11/09/2017 (Originally 01/15/1986)  . PNA vac Low Risk Adult (1 of 2 - PCV13) 11/09/2017 (Originally 01/15/1986)   Fall Risk  09/02/2017  Falls in the past year? Yes  Number falls in past yr: 1  Injury with Fall? No   Functional Status Survey:    Vitals:   10/14/17 0932  BP: 129/80  Pulse: 76  Resp: (!) 21  Temp: 97.8 F (36.6 C)  TempSrc: Oral  SpO2: 99%  Weight: 138 lb 8 oz (62.8 kg)  Height: 5\' 4"  (1.626 m)   Body mass index is 23.77 kg/m. Physical Exam  Constitutional: She is oriented to person, place, and time. She appears well-developed and well-nourished.  HENT:  Head: Normocephalic.  Mouth/Throat: Oropharynx is clear and moist.  Eyes: Pupils are equal, round, and reactive to light.  Neck: Neck supple.  Cardiovascular: Normal rate and normal heart sounds.  Pulmonary/Chest: Effort normal and breath sounds normal. No respiratory distress. She has no wheezes. She has no rales.  Abdominal: Soft. Bowel sounds are normal. She exhibits no distension. There is no tenderness. There is no rebound.  Musculoskeletal:  Trace edema Bilateral  Neurological: She is alert and oriented to person, place, and time.  Skin: Skin is warm and dry.  Psychiatric: She has a normal mood and affect. Her behavior is normal. Thought content normal.    Labs reviewed: Recent Labs    07/15/17 0805  08/20/17 0706 09/20/17 1930 09/24/17 0700  NA 134*   < > 138 140 137  K 5.5*   < > 4.0 4.6 4.6  CL 103   < > 103 100* 101  CO2 19*   < > 25 27 24   GLUCOSE 94   < > 101* 137* 97  BUN 53*   < > 25* 20 25*    CREATININE 1.52*   < > 0.77 0.93 1.01*  CALCIUM 8.7*   < > 9.1 9.3 8.8*  MG 2.3  --   --   --   --    < > = values in this interval not displayed.   Recent Labs    07/14/17 1719 09/20/17 1930 09/24/17 0700  AST 36 26 22  ALT 30 19 15   ALKPHOS 125 139* 134*  BILITOT 0.6 1.0 0.8  PROT 5.9* 7.3 6.7  ALBUMIN 3.2* 4.1 3.7   Recent Labs    07/22/17 0700 07/25/17 0800 08/20/17 0706 09/20/17 1930  WBC 6.7 5.7 7.3 7.8  NEUTROABS 3.8 3.1 4.2  --   HGB 10.1* 11.4* 12.9 13.1  HCT 32.1* 37.1 40.5 42.0  MCV 86.5 87.5 85.8 86.2  PLT 275 349 226 249   Lab Results  Component Value Date   TSH 1.446 07/15/2017   Lab Results  Component Value Date   HGBA1C 5.5 12/06/2016   Lab Results  Component Value Date   CHOL 98 06/04/2017   HDL 50 06/04/2017   LDLCALC 40 06/04/2017   TRIG 40 06/04/2017   CHOLHDL 2.0 06/04/2017    Significant Diagnostic Results in last 30 days:  Ct Abdomen Pelvis W Contrast  Result Date: 09/20/2017 CLINICAL DATA:  Sharp constant lower bilateral abdominal pain radiating to the back. EXAM: CT ABDOMEN AND PELVIS WITH CONTRAST TECHNIQUE: Multidetector CT imaging of the abdomen and pelvis was performed using the standard protocol following bolus administration of intravenous contrast. CONTRAST:  155mL ISOVUE-300 IOPAMIDOL (ISOVUE-300) INJECTION 61% COMPARISON:  07/08/2017 FINDINGS: Lower chest: Enlarged heart. Mitral valve calcifications. Calcific atherosclerotic disease of the aorta. Hepatobiliary: Mildly nodular hepatic contour. Calcified hepatic granuloma type. Postcholecystectomy. Common bile duct is prominent measuring 12 mm. Pancreas: Enlargement of the main pancreatic duct which measures 4.5 mm. Spleen: Normal in size without focal abnormality. Adrenals/Urinary Tract: Adrenal glands are unremarkable. Kidneys are normal, without renal calculi, focal lesion, or hydronephrosis. Bladder is unremarkable. Stomach/Bowel: Stomach is within normal limits. Again seen is a  duodenal diverticulum. No evidence of small-bowel obstruction. Extensive colonic diverticulosis without evidence of diverticulitis. Vascular/Lymphatic: Aortic atherosclerosis. Extensive calcified and noncalcified plaque of the aorta and branching vessels, however the named abdominal aortic branches are patent. No enlarged abdominal or pelvic lymph nodes. Reproductive: Status post hysterectomy. No adnexal masses. Other: No abdominal wall  hernia or abnormality. No abdominopelvic ascites. 14 mm subcutaneous nodule along the right lateral abdominal wall, image 33/86. Musculoskeletal: Mild levoconvex scoliosis of the lumbosacral spine. Multilevel osteoarthritic changes. Minimal anterolisthesis of L4 on L5 and L5 on S1. Advanced posterior facet arthropathy in the lower lumbosacral spine. IMPRESSION: Post cholecystectomy. Mild enlargement of the extrahepatic common bile duct. Mild enlargement of the main pancreatic duct. Given these findings, obstruction at the level of the ampulla cannot be excluded. Advanced calcific atherosclerotic disease of the aorta and its branches, however the named branches of the abdominal aorta are patent. Extensive colonic diverticulosis without evidence of diverticulitis. Persistent nodular contour of the liver. Electronically Signed   By: Fidela Salisbury M.D.   On: 09/20/2017 22:51    Assessment/Plan  Abdominal Pain With Mildly Enlargement of  CBD and Pancreatic Duct  Patient continues to have some abdominal pain. Not sure related to above. Will Increase her Protonix to 40 mg Follow up with Gastro.per family wishes.   Chronic diastolic heart failure  Weight stable. On Lasix  Paroxysmal atrial fibrillation  Rate control on Cardizem and low dose of Eliquis  Complete heart block s/p Pacemaker Follows with Cardiology. Stable  Essential hypertension BP Controlled  Cerebrovascular accident (CVA) with Aphasia On Eliquis BP controlled On Statin LDL less then  100   COPD Stable on Inhalers.  Internal carotid artery stenosis, bilateral Patient Has decided to just continue Eliquis and No Aggressive treatment  Hemorrhoids Patient has not had any more episodes of rectal bleed.  GI had decided not to do any banding procedure due to her age and Eliquis. Will discontinue Iron  Back and Joint pain Controlled on Ultram. Family/ staff Communication:   Labs/tests ordered:

## 2017-10-16 ENCOUNTER — Ambulatory Visit (INDEPENDENT_AMBULATORY_CARE_PROVIDER_SITE_OTHER): Payer: Medicare Other | Admitting: Internal Medicine

## 2017-10-16 ENCOUNTER — Encounter (INDEPENDENT_AMBULATORY_CARE_PROVIDER_SITE_OTHER): Payer: Self-pay | Admitting: *Deleted

## 2017-10-16 ENCOUNTER — Encounter (INDEPENDENT_AMBULATORY_CARE_PROVIDER_SITE_OTHER): Payer: Self-pay | Admitting: Internal Medicine

## 2017-10-16 VITALS — BP 173/80 | HR 56 | Temp 98.1°F | Ht 66.0 in

## 2017-10-16 DIAGNOSIS — K8689 Other specified diseases of pancreas: Secondary | ICD-10-CM

## 2017-10-16 NOTE — Progress Notes (Signed)
Subjective:    Patient ID: Wendy Perkins, female    DOB: Apr 25, 1919, 83 y.o.   MRN: [id number] Patient is unable to stand to have weight obtained.d HPI  Referred by Dr. Lyndel Safe for dilated pancreatic duct. Underwent a CT scan abdomen/pelvis with C M in October for constant lower bilateral abdominal pain radiating into back.  Mildly nodular hepatic contour. Post cholecystectomy. CBD at 61mm. Pancrease: Enlargement of the pancreatic duct 4.34mm She was last seen by me in June of this year. Resident of [facility]. She is unable to walk. Hx of CVA. Patient has a pacemaker.  She tells me she is doing okay.  She c/o left flank pain and pain to her back.  It hurts to move. Her appetite is okay. No weight loss. Her BMs are normal.   Son is present in room.   Hx of atrial fib and maintained on Eliquis.              Component Value Date/Time   WBC 8.3 05/13/2017 0715   RBC 4.58 05/13/2017 0715   HGB 13.1 05/13/2017 0715   HCT 40.8 05/13/2017 0715   PLT 232 05/13/2017 0715   MCV 89.1 05/13/2017 0715   MCH 28.6 05/13/2017 0715   MCHC 32.1 05/13/2017 0715   RDW 14.8 05/13/2017 0715   LYMPHSABS 2.8 05/13/2017 0715   MONOABS 0.9 05/13/2017 0715   EOSABS 0.2 05/13/2017 0715   BASOSABS 0.0 05/13/2017 0715       02/12/2012 Colonoscopy:  Procedure: Colonoscopy  Indications:Patient is a 83 year old Caucasian female who experienced three day episode of bright red blood per rectum. She is undergoing diagnostic colonoscopy  Impression:  Examination performed to cecum. Pancolonic diverticulosis (she has multiple diverticula and sigmoid colon and few more involving the rest of the colon). 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 Scott at ileocecal valve secondary to healed ulceration(NSAID). Small external hemorrhoids.     Review of Systems Past Medical History:  Diagnosis Date  . Atrial fibrillation ([place])   . Breast nodule 11/15/2014  . Breast pain,  left 11/15/2014  . Carotid artery disease ([place])   . Dizziness   . Dyspnea    Previous CPX suggesting possible restrictive physiology, respiratory muscle fatigue, diastolic dysfunction  . Essential hypertension, benign   . Hyperlipidemia   . Oxygen dependent    2 liter  . Rib pain on left side 11/15/2014  . Seasonal allergies   . Shingles 05/04/2015  . Toe fracture, left    left big toe    Past Surgical History:  Procedure Laterality Date  . KNEE SURGERY     bilateral    Allergies  Allergen Reactions  . Sulfur Swelling and Rash    Current Outpatient Medications on File Prior to Visit  Medication Sig Dispense Refill  . acetaminophen (TYLENOL) 500 MG tablet Take 1,000 mg by mouth 3 (three) times daily. Max of 3 grams acetaminophen per 24 hrs from all sources.    Jearl Klinefelter ELLIPTA 62.5-25 MCG/INH AEPB Inhale 1 Inhaler into the lungs daily.     Marland Kitchen apixaban (ELIQUIS) 2.5 MG TABS tablet Take 1 tablet (2.5 mg total) by mouth 2 (two) times daily. Resume 07/20/17 60 tablet   . atorvastatin (LIPITOR) 80 MG tablet Take 1 tablet (80 mg total) by mouth daily at 6 PM.    . cycloSPORINE (RESTASIS) 0.05 % ophthalmic emulsion Place 1 drop into both eyes every 12 (twelve) hours.     . diclofenac sodium (VOLTAREN)  1 % GEL Apply 1 application topically 4 (four) times daily as needed.    . diltiazem (CARDIZEM CD) 180 MG 24 hr capsule TAKE ONE CAPSULE BY MOUTH ONCE DAILY. 30 capsule 6  . ferrous sulfate 325 (65 FE) MG tablet Take 325 mg daily with breakfast by mouth.    . furosemide (LASIX) 20 MG tablet Take 3 tablets (60 mg total) by mouth daily. 30 tablet 1  . gabapentin (NEURONTIN) 100 MG capsule Take 1 capsule (100 mg total) by mouth 3 (three) times daily. 90 capsule 2  . Melatonin 5 MG TABS Take 5 mg by mouth at bedtime as needed.    . Multiple Vitamin (MULITIVITAMIN WITH MINERALS) TABS Take 1 tablet by mouth every other day.     Marland Kitchen omeprazole (PRILOSEC) 20 MG capsule Take 20 mg by mouth daily.        Marland Kitchen omeprazole (PRILOSEC) 20 MG capsule Take 20 mg daily by mouth.    . OXYGEN Inhale 2 L into the lungs 2 (two) times daily. 3:15pm to 11:15pm 11:15pm to 07:15am    . Polyethyl Glycol-Propyl Glycol (SYSTANE OP) Place 1 drop into both eyes 3 (three) times daily. For dry eyes    . potassium chloride (K-DUR,KLOR-CON) 10 MEQ tablet Take 20 mEq by mouth daily.    Marland Kitchen PROAIR HFA 108 (90 BASE) MCG/ACT inhaler Inhale 2 puffs into the lungs 3 (three) times daily as needed for wheezing (cough).     . traMADol (ULTRAM) 50 MG tablet Take 1 tablet (50 mg total) by mouth 2 (two) times daily. 60 tablet 0  . promethazine (PHENERGAN) 12.5 MG tablet Take 12.5 mg by mouth every 6 (six) hours as needed for nausea or vomiting.    . [DISCONTINUED] Albuterol (VENTOLIN IN) Inhale into the lungs.       No current facility-administered medications on file prior to visit.         Objective:   Physical Exam Blood pressure (!) 173/80, pulse (!) 56, temperature 98.1 F (36.7 C), height 5\' 6"  (1.676 m). Alert and oriented. Skin warm and dry. Oral mucosa is moist.   . Sclera anicteric, conjunctivae is pink. Thyroid not enlarged. No cervical lymphadenopathy. Lungs clear. Heart regular rate and rhythm.  Abdomen is soft. Bowel sounds are positive. No hepatomegaly. No abdominal masses felt. Left flank tenderness and left lower back pain.  No edema to lower extremities.  . (Examined from wheelchair)         Assessment & Plan:  Dilated pancreatic duct. Will get amylase, lipase, hepatic and US abdomen. Further recommendations to follow.

## 2017-10-16 NOTE — Patient Instructions (Addendum)
Hepatic, amylase,lipase, US abdomen

## 2017-10-17 LAB — HEPATIC FUNCTION PANEL
AG Ratio: 1.4 (calc) (ref 1.0–2.5)
ALT: 15 U/L (ref 6–29)
AST: 20 U/L (ref 10–35)
Albumin: 3.9 g/dL (ref 3.6–5.1)
Alkaline phosphatase (APISO): 136 U/L — ABNORMAL HIGH (ref 33–130)
BILIRUBIN DIRECT: 0.2 mg/dL (ref 0.0–0.2)
BILIRUBIN INDIRECT: 0.4 mg/dL (ref 0.2–1.2)
GLOBULIN: 2.7 g/dL (ref 1.9–3.7)
TOTAL PROTEIN: 6.6 g/dL (ref 6.1–8.1)
Total Bilirubin: 0.6 mg/dL (ref 0.2–1.2)

## 2017-10-17 LAB — AMYLASE: AMYLASE: 40 U/L (ref 21–101)

## 2017-10-17 LAB — LIPASE: LIPASE: 8 U/L (ref 7–60)

## 2017-10-21 ENCOUNTER — Encounter (HOSPITAL_COMMUNITY)
Admission: RE | Admit: 2017-10-21 | Discharge: 2017-10-21 | Disposition: A | Discharge status: Home / Self Care | Payer: Medicare Other | Source: Skilled Nursing Facility | Attending: Pulmonary Disease | Admitting: Pulmonary Disease

## 2017-10-21 DIAGNOSIS — E785 Hyperlipidemia, unspecified: Secondary | ICD-10-CM | POA: Insufficient documentation

## 2017-10-21 DIAGNOSIS — M25561 Pain in right knee: Secondary | ICD-10-CM | POA: Insufficient documentation

## 2017-10-21 DIAGNOSIS — M6281 Muscle weakness (generalized): Secondary | ICD-10-CM | POA: Diagnosis not present

## 2017-10-21 DIAGNOSIS — Z9981 Dependence on supplemental oxygen: Secondary | ICD-10-CM | POA: Diagnosis not present

## 2017-10-21 LAB — COMPREHENSIVE METABOLIC PANEL
ALT: 13 U/L — AB (ref 14–54)
AST: 18 U/L (ref 15–41)
Albumin: 3.3 g/dL — ABNORMAL LOW (ref 3.5–5.0)
Alkaline Phosphatase: 115 U/L (ref 38–126)
Anion gap: 7 (ref 5–15)
BUN: 25 mg/dL — AB (ref 6–20)
CHLORIDE: 105 mmol/L (ref 101–111)
CO2: 25 mmol/L (ref 22–32)
CREATININE: 0.84 mg/dL (ref 0.44–1.00)
Calcium: 8.6 mg/dL — ABNORMAL LOW (ref 8.9–10.3)
GFR calc Af Amer: >60 mL/min (ref >60)
GFR calc non Af Amer: 57 mL/min — ABNORMAL LOW (ref >60)
Glucose, Bld: 89 mg/dL (ref 65–99)
Potassium: 4.1 mmol/L (ref 3.5–5.1)
SODIUM: 137 mmol/L (ref 135–145)
Total Bilirubin: 0.6 mg/dL (ref 0.3–1.2)
Total Protein: 6.1 g/dL — ABNORMAL LOW (ref 6.5–8.1)

## 2017-10-21 LAB — BILIRUBIN, DIRECT: Bilirubin, Direct: 0.2 mg/dL (ref 0.1–0.5)

## 2017-10-22 ENCOUNTER — Ambulatory Visit (INDEPENDENT_AMBULATORY_CARE_PROVIDER_SITE_OTHER): Payer: Medicare Other | Admitting: Internal Medicine

## 2017-10-22 ENCOUNTER — Encounter: Payer: Self-pay | Admitting: Internal Medicine

## 2017-10-22 DIAGNOSIS — I482 Chronic atrial fibrillation, unspecified: Secondary | ICD-10-CM

## 2017-10-22 LAB — CUP PACEART INCLINIC DEVICE CHECK
Battery Remaining Longevity: 122 mo
Date Time Interrogation Session: 20181113170448
Implantable Lead Implant Date: 20180808
Implantable Lead Location: 753860
Implantable Lead Model: 1948
Lead Channel Pacing Threshold Amplitude: 0.5 V
Lead Channel Pacing Threshold Pulse Width: 0.5 ms
Lead Channel Sensing Intrinsic Amplitude: 3.8 mV
Lead Channel Setting Sensing Sensitivity: 0.5 mV
MDC IDC MSMT BATTERY VOLTAGE: 3.01 V
MDC IDC MSMT LEADCHNL RV IMPEDANCE VALUE: 662.5 Ohm
MDC IDC MSMT LEADCHNL RV PACING THRESHOLD AMPLITUDE: 0.5 V
MDC IDC MSMT LEADCHNL RV PACING THRESHOLD PULSEWIDTH: 0.5 ms
MDC IDC PG IMPLANT DT: 20180808
MDC IDC PG SERIAL: 7957820
MDC IDC SET LEADCHNL RV PACING AMPLITUDE: 2.5 V
MDC IDC SET LEADCHNL RV PACING PULSEWIDTH: 0.5 ms
MDC IDC STAT BRADY RV PERCENT PACED: 80 %
Pulse Gen Model: 1272

## 2017-10-22 NOTE — Patient Instructions (Addendum)
Medication Instructions: Your physician recommends that you continue on your current medications as directed. Please refer to the Current Medication list given to you today.  Labwork: None Ordered  Procedures/Testing: None Ordered  Follow-Up: Your physician wants you to follow-up in: 9 MONTHS with Dr. Cristopher Peru at [facility]. You will receive a reminder letter in the mail two months in advance. If you don't receive a letter, please call our office to schedule the follow-up appointment.  Remote monitoring is used to monitor your Pacemaker from home. This monitoring reduces the number of office visits required to check your device to one time per year. It allows [place] to keep an eye on the functioning of your device to ensure it is working properly. You are scheduled for a device check from home on 01/21/18. You may send your transmission at any time that day. If you have a wireless device, the transmission will be sent automatically. After your physician reviews your transmission, you will receive a postcard with your next transmission date.   If you need a refill on your cardiac medications before your next appointment, please call your pharmacy.

## 2017-10-22 NOTE — Progress Notes (Signed)
Patient Care Team: Virgie Dad, MD as PCP - General (Internal Medicine) Wille Celeste, PA-C as Physician Assistant (Internal Medicine)   HPI  Wendy Perkins is a 83 y.o. female Seen in followup for pacer implanted for symptomatic bradycardia in the context of permanent atrial fibrillation.  She is also on Eliquis.  She takes Cardizem for rate control.  Is nonambulatory because of knee issues and balance issues probably related to her stroke with   Date Cr Hgb  11/18 0.84 13.1          Past Medical History:  Diagnosis Date  . Atrial fibrillation ([place])   . Bradycardia   . Breast nodule 11/15/2014  . Breast pain, left 11/15/2014  . Carotid artery disease ([place])   . Dizziness   . Dyspnea    Previous CPX suggesting possible restrictive physiology, respiratory muscle fatigue, diastolic dysfunction  . Essential hypertension, benign   . Hyperlipidemia   . Oxygen dependent    2 liter  . Pacemaker [place]   . Rib pain on left side 11/15/2014  . Seasonal allergies   . Shingles 05/04/2015  . Stroke ([place])   . Toe fracture, left    left big toe    Past Surgical History:  Procedure Laterality Date  . KNEE SURGERY     bilateral    No current outpatient medications on file.   No current facility-administered medications for this visit.     Allergies  Allergen Reactions  . Sulfur Swelling and Rash   Allergies as of 10/22/2017      Reactions   Sulfur Swelling, Rash      Medication List    Notice   This visit is during an admission. Changes to the med list made in this visit will be reflected in the After Visit Summary of the admission.    Current Outpatient Medications on File Prior to Visit  Medication Sig Dispense Refill  . acetaminophen (TYLENOL) 500 MG tablet Take 1,000 mg by mouth 3 (three) times daily. Max of 3 grams acetaminophen per 24 hrs from all sources.    Jearl Klinefelter ELLIPTA 62.5-25 MCG/INH AEPB Inhale 1 Inhaler into the lungs daily.     Marland Kitchen  apixaban (ELIQUIS) 2.5 MG TABS tablet Take 1 tablet (2.5 mg total) by mouth 2 (two) times daily. Resume 07/20/17 60 tablet   . atorvastatin (LIPITOR) 80 MG tablet Take 1 tablet (80 mg total) by mouth daily at 6 PM.    . cycloSPORINE (RESTASIS) 0.05 % ophthalmic emulsion Place 1 drop into both eyes every 12 (twelve) hours.     . diclofenac sodium (VOLTAREN) 1 % GEL Apply 1 application topically 4 (four) times daily as needed.    . diltiazem (CARDIZEM CD) 180 MG 24 hr capsule TAKE ONE CAPSULE BY MOUTH ONCE DAILY. 30 capsule 6  . ferrous sulfate 325 (65 FE) MG tablet Take 325 mg daily with breakfast by mouth.    . furosemide (LASIX) 20 MG tablet Take 3 tablets (60 mg total) by mouth daily. 30 tablet 1  . gabapentin (NEURONTIN) 100 MG capsule Take 1 capsule (100 mg total) by mouth 3 (three) times daily. 90 capsule 2  . Melatonin 5 MG TABS Take 5 mg by mouth at bedtime as needed.    . Multiple Vitamin (MULITIVITAMIN WITH MINERALS) TABS Take 1 tablet by mouth every other day.     Marland Kitchen omeprazole (PRILOSEC) 20 MG capsule Take 20 mg by mouth daily.      Marland Kitchen  OXYGEN Inhale 2 L into the lungs 2 (two) times daily. 3:15pm to 11:15pm 11:15pm to 07:15am    . Polyethyl Glycol-Propyl Glycol (SYSTANE OP) Place 1 drop into both eyes 3 (three) times daily. For dry eyes    . potassium chloride (K-DUR,KLOR-CON) 10 MEQ tablet Take 20 mEq by mouth daily.    Marland Kitchen PROAIR HFA 108 (90 BASE) MCG/ACT inhaler Inhale 2 puffs into the lungs 3 (three) times daily as needed for wheezing (cough).     . promethazine (PHENERGAN) 12.5 MG tablet Take 12.5 mg by mouth every 6 (six) hours as needed for nausea or vomiting.    . traMADol (ULTRAM) 50 MG tablet Take 1 tablet (50 mg total) by mouth 2 (two) times daily. 60 tablet 0  . [DISCONTINUED] Albuterol (VENTOLIN IN) Inhale into the lungs.       No current facility-administered medications on file prior to visit.       Review of Systems negative except from HPI and PMH  Physical Exam BP  (!) 164/60   Pulse 62   Resp 16   SpO2 96%  Well developed and nourished in no acute distress HENT normal Neck supple with JVP-flat Clear Device pocket well healed; without hematoma or erythema.  There is no tethering  Regular rate and rhythm, no murmurs or gallops Abd-soft with active BS No Clubbing cyanosis mild edema Skin-warm and dry A & Oriented  Grossly normal sensory and motor function  ECG demonstrates atrial fibrillation with underlying ventricular pacing at 60  Assessment and  Plan  Atrial fibrillation-permanent  Stroke  Hypertension  Bradycardia  Pacemaker-[employer]    Patient's device function is normal.  She is pacing 80% of the time.  Blood pressure is modestly elevated.  I will defer management to the physician at the [facility]  She is on statin therapy presumably for her stroke.  We will continue this.  On Anticoagulation;  No bleeding issues       Current medicines are reviewed at length with the patient today .  The patient does not  have concerns regarding medicines.

## 2017-10-23 ENCOUNTER — Ambulatory Visit (HOSPITAL_COMMUNITY)
Admission: RE | Admit: 2017-10-23 | Discharge: 2017-10-23 | Disposition: A | Discharge status: Home / Self Care | Payer: Medicare Other | Source: Ambulatory Visit | Attending: Internal Medicine | Admitting: Internal Medicine

## 2017-10-23 DIAGNOSIS — K869 Disease of pancreas, unspecified: Secondary | ICD-10-CM | POA: Diagnosis not present

## 2017-10-23 DIAGNOSIS — K8689 Other specified diseases of pancreas: Secondary | ICD-10-CM | POA: Diagnosis not present

## 2017-10-24 DIAGNOSIS — H34811 Central retinal vein occlusion, right eye, with macular edema: Secondary | ICD-10-CM | POA: Diagnosis not present

## 2017-10-28 ENCOUNTER — Other Ambulatory Visit (INDEPENDENT_AMBULATORY_CARE_PROVIDER_SITE_OTHER): Payer: Self-pay | Admitting: *Deleted

## 2017-10-28 DIAGNOSIS — K8689 Other specified diseases of pancreas: Secondary | ICD-10-CM

## 2017-11-06 ENCOUNTER — Telehealth (INDEPENDENT_AMBULATORY_CARE_PROVIDER_SITE_OTHER): Payer: Self-pay | Admitting: Internal Medicine

## 2017-11-06 NOTE — Telephone Encounter (Signed)
No answer. Will call tomorrow. Message left on phone.

## 2017-11-06 NOTE — Telephone Encounter (Signed)
Patient's son Wendy Perkins presented to the office and stated his mom had a CT done about 2 weeks or more ago and they have not heard anything regarding the results.  He would like a call back.  [phone] John's cell phone

## 2017-11-07 NOTE — Telephone Encounter (Signed)
No answer. Message left. 

## 2017-11-11 NOTE — Telephone Encounter (Signed)
Son presented to office last week, and I spoke with him

## 2017-11-13 ENCOUNTER — Other Ambulatory Visit: Payer: Self-pay

## 2017-11-13 MED ORDER — TRAMADOL HCL 50 MG PO TABS: 50.0000 mg | ORAL_TABLET | Freq: Two times a day (BID) | ORAL | 0 refills | Status: DC

## 2017-11-13 NOTE — Telephone Encounter (Signed)
RX Fax for [facility]@ [phone]  

## 2017-12-05 ENCOUNTER — Encounter: Payer: Self-pay | Admitting: Internal Medicine

## 2017-12-05 ENCOUNTER — Non-Acute Institutional Stay (SKILLED_NURSING_FACILITY): Payer: Medicare Other | Admitting: Internal Medicine

## 2017-12-05 DIAGNOSIS — I482 Chronic atrial fibrillation, unspecified: Secondary | ICD-10-CM

## 2017-12-05 DIAGNOSIS — I639 Cerebral infarction, unspecified: Secondary | ICD-10-CM

## 2017-12-05 DIAGNOSIS — G8929 Other chronic pain: Secondary | ICD-10-CM | POA: Diagnosis not present

## 2017-12-05 DIAGNOSIS — J449 Chronic obstructive pulmonary disease, unspecified: Secondary | ICD-10-CM | POA: Diagnosis not present

## 2017-12-05 DIAGNOSIS — M545 Low back pain: Secondary | ICD-10-CM

## 2017-12-05 DIAGNOSIS — I5033 Acute on chronic diastolic (congestive) heart failure: Secondary | ICD-10-CM | POA: Diagnosis not present

## 2017-12-05 NOTE — Progress Notes (Signed)
Location:   [facility] Room Number: 156/W Place of Service:  SNF [phone]) Provider:  Freddi Starr, MD  Patient Care Team: Virgie Dad, MD as PCP - General (Internal Medicine) Rolm Baptise as Physician Assistant (Internal Medicine)  Extended Emergency Contact Information Primary Emergency Contact: Adin Hector, [place] [address] [name] of [place] Phone: [phone] Relation: Son Secondary Emergency Contact: [name], [place] [address] [name] of [place] Phone: [phone] Mobile Phone: [phone] Relation: Son  Code Status:  DNR Goals of care: Advanced Directive information Advanced Directives 09/23/2017  Does Patient Have a Medical Advance Directive? Yes  Type of Advance Directive Out of facility DNR (pink MOST or yellow form)  Does patient want to make changes to medical advance directive? No - Patient declined  Copy of [place] in Chart? No - copy requested  Would patient like information on creating a medical advance directive? No - Patient declined  Pre-existing out of facility DNR order (yellow form or pink MOST form) -     Chief Complaint  Patient presents with  . Medical Management of Chronic Issues    Routine Visit, Due Dexa Scan  . Acute Visit    Weight Gain  Medical management of chronic diseases including history of A. fib-diastolic CHF-history of heart block status post pacemaker- history of dilated pancreatic duct- chronic pain-history of CVA-COPD-.    HPI:  Pt is a 83 y.o. female seen today for medical management of chronic diseases.  As noted above as well as possible weight gain.  She has had an extended period of stability-nursing staff has noted possibly some weight gain of approximately 10 pounds over the past month----however appears there is been some variability and appears back in August her weight was actually in the low 140s  and baseline has been hovering between the higher 130s lower 140s over the past last half of the year. She does have a history of diastolic CHF is on Lasix 60 mg a day and not complaining of any shortness of breath or chest pain per nursing appears to be stable and has chronic edema but this does not appear to be grossly changed from baseline nursing staff feels she is actually doing quite well in this regard.  She does have a history of complete heart block and did receive a pacemaker secondary to this she has been followed by cardiology and thought to be doing well in this regards.  She also has complained of some chronic abdominal pain which has improved somewhat recently CT scan of the abdomen showed a dilated pancreatic duct-and she was seen by GI who ordered a renal ultrasound which showed that this was stable and possibly not clinically significant-they did order labs which were not concerning including a bilirubin level.  She continues to complain at times of some abdominal discomfort but says this has improved she is on Protonix.  She also has a history of chronic back and joint pain including knee pain which is at this point appears controlled on the tramadol as well as Neurontin.  In regards to CVA she continues with some expressive aphasia but is doing well with supportive measures she is on a statin as well as Eliquis LDL most recently was less than 100.  But she also has a history of bilateral internal carotid artery stenosis and  continues again on.  Currently she has no acute complaints she appears to be doing well resting comfortably in bed   Past Medical History:  Diagnosis Date  . Atrial fibrillation ([place])   . Bradycardia   . Breast nodule 11/15/2014  . Breast pain, left 11/15/2014  . Carotid artery disease ([place])   . Dizziness   . Dyspnea    Previous CPX suggesting possible restrictive physiology, respiratory muscle fatigue, diastolic dysfunction  . Essential hypertension,  benign   . Hyperlipidemia   . Oxygen dependent    2 liter  . Pacemaker [place]   . Rib pain on left side 11/15/2014  . Seasonal allergies   . Shingles 05/04/2015  . Stroke ([place])   . Toe fracture, left    left big toe   Past Surgical History:  Procedure Laterality Date  . COLONOSCOPY  02/22/2012   Procedure: COLONOSCOPY;  Surgeon: Rogene Houston, MD;  [facility];  Service: Endoscopy;  Laterality: N/A;  100  . KNEE SURGERY     bilateral  . PACEMAKER IMPLANT N/A 07/17/2017   Procedure: Pacemaker Implant;  Surgeon: Deboraha Sprang, MD;  Location: [facility];  Service: Cardiovascular;  Laterality: N/A;    Allergies  Allergen Reactions  . Sulfur Swelling and Rash    Outpatient Encounter Medications as of 12/05/2017  Medication Sig  . acetaminophen (TYLENOL) 500 MG tablet Take 1,000 mg by mouth 3 (three) times daily. Max of 3 grams acetaminophen per 24 hrs from all sources.  Jearl Klinefelter ELLIPTA 62.5-25 MCG/INH AEPB Inhale 1 Inhaler into the lungs daily.   Marland Kitchen apixaban (ELIQUIS) 2.5 MG TABS tablet Take 1 tablet (2.5 mg total) by mouth 2 (two) times daily. Resume 07/20/17  . atorvastatin (LIPITOR) 80 MG tablet Take 1 tablet (80 mg total) by mouth daily at 6 PM.  . cycloSPORINE (RESTASIS) 0.05 % ophthalmic emulsion Place 1 drop into both eyes every 12 (twelve) hours.   . diclofenac sodium (VOLTAREN) 1 % GEL Apply 1 application topically 4 (four) times daily as needed.  . diltiazem (CARDIZEM CD) 180 MG 24 hr capsule TAKE ONE CAPSULE BY MOUTH ONCE DAILY.  . furosemide (LASIX) 20 MG tablet Take 3 tablets (60 mg total) by mouth daily.  Marland Kitchen gabapentin (NEURONTIN) 100 MG capsule Take 1 capsule (100 mg total) by mouth 3 (three) times daily.  . hydrocortisone (ANUSOL-HC) 2.5 % rectal cream Place 1 application rectally 2 (two) times daily as needed for hemorrhoids or anal itching.  . Melatonin 5 MG TABS Take 5 mg by mouth at bedtime as needed.  . Multiple Vitamin (MULITIVITAMIN WITH  MINERALS) TABS Take 1 tablet by mouth every other day.   . OXYGEN Inhale 2 L into the lungs 2 (two) times daily. 3:15pm to 11:15pm 11:15pm to 07:15am  . pantoprazole (PROTONIX) 40 MG tablet Take 40 mg by mouth daily.  Vladimir Faster Glycol-Propyl Glycol (SYSTANE OP) Place 1 drop into both eyes 3 (three) times daily. For dry eyes  . polyethylene glycol (MIRALAX / GLYCOLAX) packet Take 17 g by mouth daily.  . potassium chloride (K-DUR,KLOR-CON) 10 MEQ tablet Take 20 mEq by mouth daily.  Marland Kitchen PROAIR HFA 108 (90 BASE) MCG/ACT inhaler Inhale 2 puffs into the lungs 3 (three) times daily as needed for wheezing (cough).   . promethazine (PHENERGAN) 12.5 MG tablet Take 12.5 mg by mouth every 6 (six) hours as needed for nausea or vomiting.  . traMADol (ULTRAM) 50 MG tablet Take 1  tablet (50 mg total) by mouth 2 (two) times daily.  . [DISCONTINUED] ferrous sulfate 325 (65 FE) MG tablet Take 325 mg daily with breakfast by mouth.  . [DISCONTINUED] omeprazole (PRILOSEC) 20 MG capsule Take 20 mg by mouth daily.     No facility-administered encounter medications on file as of 12/05/2017.      Review of Systems   In general she is not complaining any fever chills says she feels well.   rashes diaphoresis skin is not complaining of any rashes or diaphoresis.  Head ears eyes nose mouth and throat is not complaining of visual changes she has prescription lenses does not complain of sore throat.  Respiratory is not complaining of any cough or shortness of breath nursing staff says she is doing very well in this regards.  Cardiac does not complain of chest pain has some chronic lower extremity edema that nursing staff feels is stable   GI at times complains of some intermittent mild abdominal pain but says it significantly better than it used to be does not complain of nausea or vomiting diarrhea or persistent constipation.  GU is not complaining of dysuria.  Musculoskeletal does have diffuse joint complaints  especially of her knees at times but this appears to have moderated on the Ultram and Neurontin.  Neurologic does not complain of dizziness headache or numbness currently.  Does not complain of syncope.  Psych does not complain of feeling anxious or depressed at this time at times does have some anxiety but this appears to be well controlled as well.   In general this is a pleasant   Immunization History  Administered Date(s) Administered  . Influenza-Unspecified 10/10/2013, 09/11/2017  . Pneumococcal Conjugate-13 09/13/2017  . Tdap 09/16/2017  . Zoster 12/05/2017   Pertinent  Health Maintenance Due  Topic Date Due  . DEXA SCAN  01/05/2018 (Originally [birth year]/1986)  . PNA vac Low Risk Adult (2 of 2 - PPSV23) 09/13/2018  . INFLUENZA VACCINE  Completed   Fall Risk  09/02/2017  Falls in the past year? Yes  Number falls in past yr: 1  Injury with Fall? No   Functional Status Survey:    Vitals:   12/05/17 1039  BP: 135/86  Pulse: 76  Resp: 18  Temp: 98 F (36.7 C)  TempSrc: Oral  SpO2: 97%  Weight: 146 lb 3.2 oz (66.3 kg)   Body mass index is 23.6 kg/m. Physical Exam   In general this is a fairly well-developed elderly female in no distress lying comfortably in bed she appears younger than stated age.  Her skin is warm and dry.  Eyes visual acuity appears grossly intact she has prescription lenses.  Oropharynx is clear mucous membranes moist.  Chest is clear to auscultation there is no labored breathing.  Heart is regular irregular rate and rhythm without murmur gallop or rub she has moderate lower extremity edema per nursing staff this is stable.  Abdomen is soft does not appear to be acutely tender some mild tenderness palpation I suspect result of the invasive maneuver-bowel sounds are active.  Musculoskeletal Limited exam since she is in bed but is able to move all extremities x4 she does ambulate in a wheelchair.    neurologic cranial nerves appear grossly  intact continues with some expressive aphasia.  Psych she appears largely alert and oriented has expressive aphasia but is able to communicate effectively.    Labs reviewed: Recent Labs    07/15/17 0805  09/20/17 1930 09/24/17 0700 10/21/17  0300  NA 134*   < > 140 137 137  K 5.5*   < > 4.6 4.6 4.1  CL 103   < > 100* 101 105  CO2 19*   < > 27 24 25   GLUCOSE 94   < > 137* 97 89  BUN 53*   < > 20 25* 25*  CREATININE 1.52*   < > 0.93 1.01* 0.84  CALCIUM 8.7*   < > 9.3 8.8* 8.6*  MG 2.3  --   --   --   --    < > = values in this interval not displayed.   Recent Labs    09/20/17 1930 09/24/17 0700 10/16/17 1608 10/21/17 0300  AST 26 22 20 18   ALT 19 15 15  13*  ALKPHOS 139* 134*  --  115  BILITOT 1.0 0.8 0.6 0.6  PROT 7.3 6.7 6.6 6.1*  ALBUMIN 4.1 3.7  --  3.3*   Recent Labs    07/22/17 0700 07/25/17 0800 08/20/17 0706 09/20/17 1930  WBC 6.7 5.7 7.3 7.8  NEUTROABS 3.8 3.1 4.2  --   HGB 10.1* 11.4* 12.9 13.1  HCT 32.1* 37.1 40.5 42.0  MCV 86.5 87.5 85.8 86.2  PLT 275 349 226 249   Lab Results  Component Value Date   TSH 1.446 07/15/2017   Lab Results  Component Value Date   HGBA1C 5.5 12/06/2016   Lab Results  Component Value Date   CHOL 98 06/04/2017   HDL 50 06/04/2017   LDLCALC 40 06/04/2017   TRIG 40 06/04/2017   CHOLHDL 2.0 06/04/2017    Significant Diagnostic Results in last 30 days:  No results found.  Assessment/Plan  History of weight gain-she appears to have some variability apparently she is eating well and since August her weights have varied mainly from the higher 130s it appears to the mid 140s-most recently 146-this is fairly stable the last couple weeks but compared to last month's weight is up about 10 pounds.  Clinically she appears to be stable nursing staff feels she is doing very well- will order a BNP tomorrow I note in the hospital in August her BNP was slightly over 500  Also will start a short course of Lasix in the  afternoon 20 mg in addition to the 60 she gets in the morning for 3 days-and monitor- she still has some edema --and weight may be trending up slowly--will await results of BNP as well.  Also will update a BMP to assess renal function which may affect how we dose of diuretic.  2.-History of A. fib this is followed by cardiology appears stable on Eliquis she is on diltiazem for rate control.  3.  History of heart block she is status post pacemaker and has tolerated this well and cardiology checks on this regularly.  4.  History of dilated pancreatic duct as noted above this is been worked up by GI and it appears they think this is fairly clinically stable per recent ultrasound labs were not concerning she does continue on Protonix abdominal pain appears to be improved.  5.  History of back and joint pain including knee pain this point appears stabilized on the tramadol and Neurontin.  6.  History of CVA with continues with expressive aphasia but is doing well she is on Eliquis for anticoagulation.--She is also on a statin LDL was 40 on lab done in June 2018. Will also obtain a lipid panel  #7-history COPD this appears stable on  current inhalers includingon  Breo Ellipta--  #8 history of bilateral and internal carotid artery stenosis she again is on Eliquis.  9.  History of hemorrhoids this was a significant issue in the past she is on Anusol and this appears to be helping banding apparently was thought not to be an option since she is on anticoagulation nonetheless clinically this appears to have moderated.  Again will order a BMP as well as a BNP tomorrow fasting lipid panel and will update a CBC with differential as well.  Also will give a short course of p.m. Lasix 20 mg a day for 3 days secondary to  weight gain trend- will await results of BN  P as well   [id number]-of note greater than 40 minutes spent assessing patient-reviewed her chart-reviewed her labs- discussing her status with  nursing staff- and coordinating and formulating a plan of care for numerous diagnoses-of note   greater than 50% of time spent coordinating plan of care

## 2017-12-06 ENCOUNTER — Encounter (HOSPITAL_COMMUNITY)
Admission: RE | Admit: 2017-12-06 | Discharge: 2017-12-06 | Disposition: A | Discharge status: Home / Self Care | Payer: Medicare Other | Source: Skilled Nursing Facility | Attending: Internal Medicine | Admitting: Internal Medicine

## 2017-12-06 ENCOUNTER — Other Ambulatory Visit: Payer: Self-pay

## 2017-12-06 DIAGNOSIS — I442 Atrioventricular block, complete: Secondary | ICD-10-CM | POA: Insufficient documentation

## 2017-12-06 DIAGNOSIS — I5032 Chronic diastolic (congestive) heart failure: Secondary | ICD-10-CM | POA: Insufficient documentation

## 2017-12-06 DIAGNOSIS — Z950 Presence of cardiac pacemaker: Secondary | ICD-10-CM | POA: Diagnosis not present

## 2017-12-06 DIAGNOSIS — K625 Hemorrhage of anus and rectum: Secondary | ICD-10-CM | POA: Insufficient documentation

## 2017-12-06 DIAGNOSIS — I63312 Cerebral infarction due to thrombosis of left middle cerebral artery: Secondary | ICD-10-CM | POA: Diagnosis not present

## 2017-12-06 LAB — CBC WITH DIFFERENTIAL/PLATELET
Basophils Absolute: 0 10*3/uL (ref 0.0–0.1)
Basophils Relative: 0 %
EOS PCT: 2 %
Eosinophils Absolute: 0.2 10*3/uL (ref 0.0–0.7)
HCT: 40.1 % (ref 36.0–46.0)
HEMOGLOBIN: 12.2 g/dL (ref 12.0–15.0)
LYMPHS ABS: 1.8 10*3/uL (ref 0.7–4.0)
LYMPHS PCT: 20 %
MCH: 27.1 pg (ref 26.0–34.0)
MCHC: 30.4 g/dL (ref 30.0–36.0)
MCV: 88.9 fL (ref 78.0–100.0)
Monocytes Absolute: 1 10*3/uL (ref 0.1–1.0)
Monocytes Relative: 11 %
Neutro Abs: 5.7 10*3/uL (ref 1.7–7.7)
Neutrophils Relative %: 67 %
PLATELETS: 205 10*3/uL (ref 150–400)
RBC: 4.51 MIL/uL (ref 3.87–5.11)
RDW: 15.1 % (ref 11.5–15.5)
WBC: 8.7 10*3/uL (ref 4.0–10.5)

## 2017-12-06 LAB — BASIC METABOLIC PANEL
Anion gap: 8 (ref 5–15)
BUN: 27 mg/dL — AB (ref 6–20)
CHLORIDE: 105 mmol/L (ref 101–111)
CO2: 27 mmol/L (ref 22–32)
Calcium: 8.7 mg/dL — ABNORMAL LOW (ref 8.9–10.3)
Creatinine, Ser: 1.02 mg/dL — ABNORMAL HIGH (ref 0.44–1.00)
GFR calc Af Amer: 52 mL/min — ABNORMAL LOW (ref >60)
GFR calc non Af Amer: 45 mL/min — ABNORMAL LOW (ref >60)
GLUCOSE: 91 mg/dL (ref 65–99)
POTASSIUM: 4 mmol/L (ref 3.5–5.1)
Sodium: 140 mmol/L (ref 135–145)

## 2017-12-06 LAB — LIPID PANEL
CHOL/HDL RATIO: 2 ratio
Cholesterol: 107 mg/dL (ref 0–200)
HDL: 53 mg/dL (ref >40)
LDL CALC: 49 mg/dL (ref 0–99)
Triglycerides: 27 mg/dL (ref <150)
VLDL: 5 mg/dL (ref 0–40)

## 2017-12-06 LAB — BRAIN NATRIURETIC PEPTIDE: B Natriuretic Peptide: 397 pg/mL — ABNORMAL HIGH (ref 0.0–100.0)

## 2017-12-06 MED ORDER — TRAMADOL HCL 50 MG PO TABS: 50.0000 mg | ORAL_TABLET | Freq: Two times a day (BID) | ORAL | 0 refills | Status: DC

## 2017-12-06 NOTE — Telephone Encounter (Signed)
RX Fax for [facility]@ [phone]  

## 2017-12-07 ENCOUNTER — Encounter (HOSPITAL_COMMUNITY)
Admission: RE | Admit: 2017-12-07 | Discharge: 2017-12-07 | Disposition: A | Discharge status: Home / Self Care | Payer: Medicare Other | Source: Skilled Nursing Facility | Attending: *Deleted | Admitting: *Deleted

## 2017-12-07 DIAGNOSIS — I5032 Chronic diastolic (congestive) heart failure: Secondary | ICD-10-CM | POA: Diagnosis not present

## 2017-12-07 DIAGNOSIS — K625 Hemorrhage of anus and rectum: Secondary | ICD-10-CM | POA: Diagnosis not present

## 2017-12-07 DIAGNOSIS — I442 Atrioventricular block, complete: Secondary | ICD-10-CM | POA: Diagnosis not present

## 2017-12-07 DIAGNOSIS — Z950 Presence of cardiac pacemaker: Secondary | ICD-10-CM | POA: Diagnosis not present

## 2017-12-07 DIAGNOSIS — I63312 Cerebral infarction due to thrombosis of left middle cerebral artery: Secondary | ICD-10-CM | POA: Diagnosis not present

## 2017-12-07 LAB — CBC WITH DIFFERENTIAL/PLATELET
BASOS ABS: 0 10*3/uL (ref 0.0–0.1)
BASOS PCT: 0 %
Eosinophils Absolute: 0.2 10*3/uL (ref 0.0–0.7)
Eosinophils Relative: 3 %
HEMATOCRIT: 38.6 % (ref 36.0–46.0)
HEMOGLOBIN: 12 g/dL (ref 12.0–15.0)
Lymphocytes Relative: 27 %
Lymphs Abs: 1.6 10*3/uL (ref 0.7–4.0)
MCH: 27.6 pg (ref 26.0–34.0)
MCHC: 31.1 g/dL (ref 30.0–36.0)
MCV: 88.7 fL (ref 78.0–100.0)
Monocytes Absolute: 0.9 10*3/uL (ref 0.1–1.0)
Monocytes Relative: 15 %
NEUTROS ABS: 3.1 10*3/uL (ref 1.7–7.7)
NEUTROS PCT: 55 %
Platelets: 180 10*3/uL (ref 150–400)
RBC: 4.35 MIL/uL (ref 3.87–5.11)
RDW: 15 % (ref 11.5–15.5)
WBC: 5.7 10*3/uL (ref 4.0–10.5)

## 2017-12-07 LAB — BASIC METABOLIC PANEL
ANION GAP: 13 (ref 5–15)
BUN: 24 mg/dL — ABNORMAL HIGH (ref 6–20)
CALCIUM: 8.7 mg/dL — AB (ref 8.9–10.3)
CO2: 23 mmol/L (ref 22–32)
Chloride: 103 mmol/L (ref 101–111)
Creatinine, Ser: 0.92 mg/dL (ref 0.44–1.00)
GFR, EST AFRICAN AMERICAN: 59 mL/min — AB (ref >60)
GFR, EST NON AFRICAN AMERICAN: 51 mL/min — AB (ref >60)
Glucose, Bld: 89 mg/dL (ref 65–99)
Potassium: 4 mmol/L (ref 3.5–5.1)
Sodium: 139 mmol/L (ref 135–145)

## 2017-12-08 LAB — LIPID PANEL
Cholesterol: 113 mg/dL (ref 0–200)
HDL: 56 mg/dL (ref >40)
LDL Cholesterol: 48 mg/dL (ref 0–99)
Total CHOL/HDL Ratio: 2 RATIO
Triglycerides: 47 mg/dL (ref <150)
VLDL: 9 mg/dL (ref 0–40)

## 2017-12-08 LAB — BRAIN NATRIURETIC PEPTIDE: B NATRIURETIC PEPTIDE 5: 286 pg/mL — AB (ref 0.0–100.0)

## 2017-12-09 ENCOUNTER — Other Ambulatory Visit: Payer: Self-pay

## 2017-12-09 MED ORDER — TRAMADOL HCL 50 MG PO TABS: 50.0000 mg | ORAL_TABLET | Freq: Two times a day (BID) | ORAL | 0 refills | Status: DC

## 2017-12-09 NOTE — Telephone Encounter (Signed)
RX Fax for [facility]@ [phone]  

## 2017-12-12 ENCOUNTER — Encounter (HOSPITAL_COMMUNITY)
Admission: RE | Admit: 2017-12-12 | Discharge: 2017-12-12 | Disposition: A | Discharge status: Home / Self Care | Payer: Medicare Other | Source: Skilled Nursing Facility | Attending: Internal Medicine | Admitting: Internal Medicine

## 2017-12-12 DIAGNOSIS — I5032 Chronic diastolic (congestive) heart failure: Secondary | ICD-10-CM | POA: Insufficient documentation

## 2017-12-12 LAB — BASIC METABOLIC PANEL
Anion gap: 14 (ref 5–15)
BUN: 22 mg/dL — ABNORMAL HIGH (ref 6–20)
CALCIUM: 9 mg/dL (ref 8.9–10.3)
CO2: 25 mmol/L (ref 22–32)
Chloride: 103 mmol/L (ref 101–111)
Creatinine, Ser: 0.85 mg/dL (ref 0.44–1.00)
GFR calc Af Amer: >60 mL/min (ref >60)
GFR, EST NON AFRICAN AMERICAN: 56 mL/min — AB (ref >60)
Glucose, Bld: 112 mg/dL — ABNORMAL HIGH (ref 65–99)
Potassium: 3.5 mmol/L (ref 3.5–5.1)
Sodium: 142 mmol/L (ref 135–145)

## 2017-12-16 ENCOUNTER — Encounter (HOSPITAL_COMMUNITY)
Admission: RE | Admit: 2017-12-16 | Discharge: 2017-12-16 | Disposition: A | Discharge status: Home / Self Care | Payer: Medicare Other | Source: Skilled Nursing Facility | Attending: *Deleted | Admitting: *Deleted

## 2017-12-16 DIAGNOSIS — M6281 Muscle weakness (generalized): Secondary | ICD-10-CM | POA: Diagnosis not present

## 2017-12-16 DIAGNOSIS — M25512 Pain in left shoulder: Secondary | ICD-10-CM | POA: Diagnosis not present

## 2017-12-16 DIAGNOSIS — I63312 Cerebral infarction due to thrombosis of left middle cerebral artery: Secondary | ICD-10-CM | POA: Diagnosis not present

## 2017-12-16 DIAGNOSIS — I5032 Chronic diastolic (congestive) heart failure: Secondary | ICD-10-CM | POA: Diagnosis not present

## 2017-12-16 DIAGNOSIS — M1990 Unspecified osteoarthritis, unspecified site: Secondary | ICD-10-CM | POA: Diagnosis not present

## 2017-12-16 DIAGNOSIS — R279 Unspecified lack of coordination: Secondary | ICD-10-CM | POA: Diagnosis not present

## 2017-12-16 LAB — BASIC METABOLIC PANEL
Anion gap: 9 (ref 5–15)
BUN: 22 mg/dL — AB (ref 6–20)
CO2: 29 mmol/L (ref 22–32)
CREATININE: 0.95 mg/dL (ref 0.44–1.00)
Calcium: 9 mg/dL (ref 8.9–10.3)
Chloride: 103 mmol/L (ref 101–111)
GFR calc Af Amer: 57 mL/min — ABNORMAL LOW (ref >60)
GFR, EST NON AFRICAN AMERICAN: 49 mL/min — AB (ref >60)
GLUCOSE: 92 mg/dL (ref 65–99)
Potassium: 3.5 mmol/L (ref 3.5–5.1)
SODIUM: 141 mmol/L (ref 135–145)

## 2017-12-17 DIAGNOSIS — M1990 Unspecified osteoarthritis, unspecified site: Secondary | ICD-10-CM | POA: Diagnosis not present

## 2017-12-17 DIAGNOSIS — M6281 Muscle weakness (generalized): Secondary | ICD-10-CM | POA: Diagnosis not present

## 2017-12-17 DIAGNOSIS — R279 Unspecified lack of coordination: Secondary | ICD-10-CM | POA: Diagnosis not present

## 2017-12-17 DIAGNOSIS — I63312 Cerebral infarction due to thrombosis of left middle cerebral artery: Secondary | ICD-10-CM | POA: Diagnosis not present

## 2017-12-17 DIAGNOSIS — M25512 Pain in left shoulder: Secondary | ICD-10-CM | POA: Diagnosis not present

## 2017-12-18 DIAGNOSIS — M25512 Pain in left shoulder: Secondary | ICD-10-CM | POA: Diagnosis not present

## 2017-12-18 DIAGNOSIS — M6281 Muscle weakness (generalized): Secondary | ICD-10-CM | POA: Diagnosis not present

## 2017-12-18 DIAGNOSIS — R279 Unspecified lack of coordination: Secondary | ICD-10-CM | POA: Diagnosis not present

## 2017-12-18 DIAGNOSIS — M1990 Unspecified osteoarthritis, unspecified site: Secondary | ICD-10-CM | POA: Diagnosis not present

## 2017-12-18 DIAGNOSIS — I63312 Cerebral infarction due to thrombosis of left middle cerebral artery: Secondary | ICD-10-CM | POA: Diagnosis not present

## 2017-12-19 DIAGNOSIS — R279 Unspecified lack of coordination: Secondary | ICD-10-CM | POA: Diagnosis not present

## 2017-12-19 DIAGNOSIS — B351 Tinea unguium: Secondary | ICD-10-CM | POA: Diagnosis not present

## 2017-12-19 DIAGNOSIS — I63312 Cerebral infarction due to thrombosis of left middle cerebral artery: Secondary | ICD-10-CM | POA: Diagnosis not present

## 2017-12-19 DIAGNOSIS — M1990 Unspecified osteoarthritis, unspecified site: Secondary | ICD-10-CM | POA: Diagnosis not present

## 2017-12-19 DIAGNOSIS — M25512 Pain in left shoulder: Secondary | ICD-10-CM | POA: Diagnosis not present

## 2017-12-19 DIAGNOSIS — M6281 Muscle weakness (generalized): Secondary | ICD-10-CM | POA: Diagnosis not present

## 2017-12-19 DIAGNOSIS — I739 Peripheral vascular disease, unspecified: Secondary | ICD-10-CM | POA: Diagnosis not present

## 2017-12-20 DIAGNOSIS — M1990 Unspecified osteoarthritis, unspecified site: Secondary | ICD-10-CM | POA: Diagnosis not present

## 2017-12-20 DIAGNOSIS — R279 Unspecified lack of coordination: Secondary | ICD-10-CM | POA: Diagnosis not present

## 2017-12-20 DIAGNOSIS — M25512 Pain in left shoulder: Secondary | ICD-10-CM | POA: Diagnosis not present

## 2017-12-20 DIAGNOSIS — I63312 Cerebral infarction due to thrombosis of left middle cerebral artery: Secondary | ICD-10-CM | POA: Diagnosis not present

## 2017-12-20 DIAGNOSIS — M6281 Muscle weakness (generalized): Secondary | ICD-10-CM | POA: Diagnosis not present

## 2017-12-24 ENCOUNTER — Encounter: Payer: Self-pay | Admitting: Internal Medicine

## 2017-12-24 ENCOUNTER — Non-Acute Institutional Stay (SKILLED_NURSING_FACILITY): Payer: Medicare Other | Admitting: Internal Medicine

## 2017-12-24 DIAGNOSIS — I482 Chronic atrial fibrillation, unspecified: Secondary | ICD-10-CM

## 2017-12-24 DIAGNOSIS — R279 Unspecified lack of coordination: Secondary | ICD-10-CM | POA: Diagnosis not present

## 2017-12-24 DIAGNOSIS — I5032 Chronic diastolic (congestive) heart failure: Secondary | ICD-10-CM | POA: Diagnosis not present

## 2017-12-24 DIAGNOSIS — M1990 Unspecified osteoarthritis, unspecified site: Secondary | ICD-10-CM | POA: Diagnosis not present

## 2017-12-24 DIAGNOSIS — I10 Essential (primary) hypertension: Secondary | ICD-10-CM

## 2017-12-24 DIAGNOSIS — I63312 Cerebral infarction due to thrombosis of left middle cerebral artery: Secondary | ICD-10-CM | POA: Diagnosis not present

## 2017-12-24 DIAGNOSIS — M6281 Muscle weakness (generalized): Secondary | ICD-10-CM | POA: Diagnosis not present

## 2017-12-24 DIAGNOSIS — I442 Atrioventricular block, complete: Secondary | ICD-10-CM

## 2017-12-24 DIAGNOSIS — M25512 Pain in left shoulder: Secondary | ICD-10-CM | POA: Diagnosis not present

## 2017-12-24 NOTE — Progress Notes (Signed)
Location:   [facility] Room Number: 156/W Place of Service:  SNF [phone]) Provider:  Clydene Fake, MD  Patient Care Team: Virgie Dad, MD as PCP - General (Internal Medicine) Rolm Baptise as Physician Assistant (Internal Medicine)  Extended Emergency Contact Information Primary Emergency Contact: Adin Hector, [place] [address] [name] of [place] Phone: [phone] Relation: Son Secondary Emergency Contact: [name], [place] [address] [name] of [place] Phone: [phone] Mobile Phone: [phone] Relation: Son  Code Status:  DNR Goals of care: Advanced Directive information Advanced Directives 12/24/2017  Does Patient Have a Medical Advance Directive? Yes  Type of Advance Directive Out of facility DNR (pink MOST or yellow form)  Does patient want to make changes to medical advance directive? No - Patient declined  Copy of [place] in Chart? No - copy requested  Would patient like information on creating a medical advance directive? No - Patient declined  Pre-existing out of facility DNR order (yellow form or pink MOST form) -     Chief Complaint  Patient presents with  . Acute Visit    Patients c/o Weight Gain    HPI:  Pt is a 84 y.o. female seen today for an acute visit for for Weight Gain and Elevated BP. Patient has h/o Atrial fibrillation on Eliquis, S/P Pacemaker for sick sinus, S/P Acute CVA involving left MCA , ICA stenosis,Diastolic CHF,Dilated Pancreatic Duct, Hemorrhoids, hyperlipidemia, And Hypertension And Arthritis with Chronic Knee and Back Pain.right retinal vein occlusion.  Patient was seen today as she has gained a few pounds in the past few weeks from 134-136 today.  The nurses also thought that she had increased swelling in her legs.  Her blood pressure has also been consistently high in the facility. Patient did not have any acute  complaints today.  She denied any chest pain ,shortness of breath or  Cough. She does have a chronic pain in her shoulder and low back.   Past Medical History:  Diagnosis Date  . Atrial fibrillation ([place])   . Bradycardia   . Breast nodule 11/15/2014  . Breast pain, left 11/15/2014  . Carotid artery disease ([place])   . Dizziness   . Dyspnea    Previous CPX suggesting possible restrictive physiology, respiratory muscle fatigue, diastolic dysfunction  . Essential hypertension, benign   . Hyperlipidemia   . Oxygen dependent    2 liter  . Pacemaker [place]   . Rib pain on left side 11/15/2014  . Seasonal allergies   . Shingles 05/04/2015  . Stroke ([place])   . Toe fracture, left    left big toe   Past Surgical History:  Procedure Laterality Date  . COLONOSCOPY  02/22/2012   Procedure: COLONOSCOPY;  Surgeon: Rogene Houston, MD;  [facility];  Service: Endoscopy;  Laterality: N/A;  100  . KNEE SURGERY     bilateral  . PACEMAKER IMPLANT N/A 07/17/2017   Procedure: Pacemaker Implant;  Surgeon: Deboraha Sprang, MD;  Location: [facility];  Service: Cardiovascular;  Laterality: N/A;    Allergies  Allergen Reactions  . Sulfur Swelling and Rash    Outpatient Encounter Medications as of 12/24/2017  Medication Sig  . acetaminophen (TYLENOL) 500 MG tablet Take 1,000 mg by mouth 3 (three) times daily. Max of 3 grams acetaminophen  per 24 hrs from all sources.  Jearl Klinefelter ELLIPTA 62.5-25 MCG/INH AEPB Inhale 1 Inhaler into the lungs daily.   Marland Kitchen apixaban (ELIQUIS) 2.5 MG TABS tablet Take 1 tablet (2.5 mg total) by mouth 2 (two) times daily. Resume 07/20/17  . atorvastatin (LIPITOR) 80 MG tablet Take 1 tablet (80 mg total) by mouth daily at 6 PM.  . cycloSPORINE (RESTASIS) 0.05 % ophthalmic emulsion Place 1 drop into both eyes every 12 (twelve) hours.   . diclofenac sodium (VOLTAREN) 1 % GEL Apply 1 application topically 4 (four) times daily as needed.  . diltiazem (CARDIZEM CD) 180 MG  24 hr capsule TAKE ONE CAPSULE BY MOUTH ONCE DAILY.  . furosemide (LASIX) 20 MG tablet Take 3 tablets (60 mg total) by mouth daily.  Marland Kitchen gabapentin (NEURONTIN) 100 MG capsule Take 1 capsule (100 mg total) by mouth 3 (three) times daily.  . hydrocortisone (ANUSOL-HC) 2.5 % rectal cream Place 1 application rectally 2 (two) times daily as needed for hemorrhoids or anal itching.  . Melatonin 5 MG TABS Take 5 mg by mouth at bedtime as needed.  . Multiple Vitamin (MULITIVITAMIN WITH MINERALS) TABS Take 1 tablet by mouth every other day.   . OXYGEN Inhale 2 L into the lungs 2 (two) times daily. 3:15pm to 11:15pm 11:15pm to 07:15am  . pantoprazole (PROTONIX) 40 MG tablet Take 40 mg by mouth daily.  Vladimir Faster Glycol-Propyl Glycol (SYSTANE OP) Place 1 drop into both eyes 3 (three) times daily. For dry eyes  . polyethylene glycol (MIRALAX / GLYCOLAX) packet Take 17 g by mouth daily.  . potassium chloride (K-DUR,KLOR-CON) 10 MEQ tablet Take 20 mEq by mouth daily.  Marland Kitchen PROAIR HFA 108 (90 BASE) MCG/ACT inhaler Inhale 2 puffs into the lungs 3 (three) times daily as needed for wheezing (cough).   . traMADol (ULTRAM) 50 MG tablet Take 1 tablet (50 mg total) by mouth 2 (two) times daily.   No facility-administered encounter medications on file as of 12/24/2017.      Review of Systems  Review of Systems  Constitutional: Negative for activity change, appetite change, chills, diaphoresis, fatigue and fever.  HENT: Negative for mouth sores, postnasal drip, rhinorrhea, sinus pain and sore throat.   Respiratory: Negative for apnea, cough, chest tightness, shortness of breath and wheezing.   Cardiovascular: Negative for chest pain, palpitations  Gastrointestinal: Negative for abdominal distention, abdominal pain, constipation, diarrhea, nausea and vomiting.  Genitourinary: Negative for dysuria and frequency.  Musculoskeletal: positive for arthralgias,  Skin: Negative for rash.  Neurological: Negative for dizziness,  syncope, weakness, light-headedness and numbness.  Psychiatric/Behavioral: Negative for behavioral problems, confusion     Immunization History  Administered Date(s) Administered  . Influenza-Unspecified 10/10/2013, 09/11/2017  . Pneumococcal Conjugate-13 09/13/2017  . Tdap 09/16/2017  . Zoster 12/05/2017   Pertinent  Health Maintenance Due  Topic Date Due  . DEXA SCAN  01/05/2018 (Originally 01/15/1986)  . PNA vac Low Risk Adult (2 of 2 - PPSV23) 09/13/2018  . INFLUENZA VACCINE  Completed   Fall Risk  09/02/2017  Falls in the past year? Yes  Number falls in past yr: 1  Injury with Fall? No   Functional Status Survey:    Vitals:   12/24/17 1308  BP: (!) 182/76 Repeat manually was 170/80  Pulse: 62  Resp: 18  Temp: (!) 97.4 F (36.3 C)  TempSrc: Oral  SpO2: 94%  Weight: 136 lb 11.2 oz (62 kg)  Height: _0  (1.676 m)   Body mass  index is 22.06 kg/m. Physical Exam  Constitutional: She appears well-developed and well-nourished.  HENT:  Head: Normocephalic.  Mouth/Throat: Oropharynx is clear and moist.  Eyes: Pupils are equal, round, and reactive to light.  Neck: Neck supple.  Cardiovascular: Normal rate and normal heart sounds. An irregular rhythm present.  Pulmonary/Chest: Effort normal and breath sounds normal. No respiratory distress. She has no wheezes. She has no rales.  Abdominal: Soft. Bowel sounds are normal. She exhibits no distension. There is no tenderness. There is no rebound.  Musculoskeletal:  Moderate Edema Bilateral  Neurological: She is alert.  No Focal deficit Does have expressive Aphasia  Skin: Skin is warm and dry.  Psychiatric: Her speech is delayed. She is slowed. Cognition and memory are impaired. She exhibits a depressed mood.    Labs reviewed: Recent Labs    07/15/17 0805  12/07/17 0930 12/12/17 0800 12/16/17 0630  NA 134*   < > 139 142 141  K 5.5*   < > 4.0 3.5 3.5  CL 103   < > 103 103 103  CO2 19*   < > _0 GLUCOSE 94    < > 89 112* 92  BUN 53*   < > 24* 22* 22*  CREATININE 1.52*   < > 0.92 0.85 0.95  CALCIUM 8.7*   < > 8.7* 9.0 9.0  MG 2.3  --   --   --   --    < > = values in this interval not displayed.   Recent Labs    09/20/17 1930 09/24/17 0700 10/16/17 1608 10/21/17 0300  AST _1 ALT _2 13*  ALKPHOS 139* 134*  --  115  BILITOT 1.0 0.8 0.6 0.6  PROT 7.3 6.7 6.6 6.1*  ALBUMIN 4.1 3.7  --  3.3*   Recent Labs    08/20/17 0706 09/20/17 1930 12/06/17 0714 12/07/17 0930  WBC 7.3 7.8 8.7 5.7  NEUTROABS 4.2  --  5.7 3.1  HGB 12.9 13.1 12.2 12.0  HCT 40.5 42.0 40.1 38.6  MCV 85.8 86.2 88.9 88.7  PLT 226 249 205 180   Lab Results  Component Value Date   TSH 1.446 07/15/2017   Lab Results  Component Value Date   HGBA1C 5.5 12/06/2016   Lab Results  Component Value Date   CHOL 113 12/07/2017   HDL 56 12/07/2017   LDLCALC 48 12/07/2017   TRIG 47 12/07/2017   CHOLHDL 2.0 12/07/2017    Significant Diagnostic Results in last 30 days:  No results found.  Assessment/Plan Essential hypertension Patient's blood pressure has been elevated recently.  With her history of CVA will start her on low-dose of Norvasc 2.5 mg daily Continue to follow blood pressure closely.  Chronic diastolic heart failure  With increase in weight and elevated blood pressure Will increase her Lasix to 40 mg twice daily Repeat BMP in 1 week Continue to monitor weights  S/P Pacemaker insertion Patient has done well in facility. She is stable Is follow up with Cardiology   Chronic atrial fibrillation  Rate is controlled on Diltiazem. Continue Eliquis  Internal carotid artery stenosis, bilateral Patient on  Eliquis. Son has decided to not be aggressive.  S/P Cerebrovascular accident with mild Dysphasia Doing well on Eliquis . Aspirin was held due to her GI bleed. No change required. She is on high dose of Statins Dilated pancreatic duct with elevated alk phos Patient follows  with GI Ultrasound was negative.  At this time no aggressive workup needed Hemorrhoids Patient has not had any more episodes of rectal bleed.or abdominal Pain Her Hgb is stable . GI had decided not to do any banding procedure due to her age and Eliquis.  Insomnia Melatonin as needed. Back and Joint pain Controlled on Ultram. She is also on Neurontin.     Family/ staff Communication:   Labs/tests ordered:   Total time spent in this patient care encounter was _25 minutes; greater than 50% of the visit spent counseling patient, reviewing records , Labs and coordinating care for problems addressed at this encounter.

## 2017-12-24 NOTE — Progress Notes (Signed)
Location:   [facility] Room Number: 156/W Place of Service:  SNF [phone]) Provider:  Freddi Starr, MD  Patient Care Team: Virgie Dad, MD as PCP - General (Internal Medicine) Rolm Baptise as Physician Assistant (Internal Medicine)  Extended Emergency Contact Information Primary Emergency Contact: Adin Hector, [place] [address] [name] of [place] Phone: [phone] Relation: Son Secondary Emergency Contact: [name], [place] [address] [name] of [place] Phone: [phone] Mobile Phone: [phone] Relation: Son  Code Status:  DNR Goals of care: Advanced Directive information Advanced Directives 12/24/2017  Does Patient Have a Medical Advance Directive? Yes  Type of Advance Directive Out of facility DNR (pink MOST or yellow form)  Does patient want to make changes to medical advance directive? No - Patient declined  Copy of [place] in Chart? No - copy requested  Would patient like information on creating a medical advance directive? No - Patient declined  Pre-existing out of facility DNR order (yellow form or pink MOST form) -     Chief Complaint  Patient presents with  . Acute Visit    Patients c/o Weight Gain    HPI:  Pt is a 84 y.o. female seen today for an acute visit for    Past Medical History:  Diagnosis Date  . Atrial fibrillation ([place])   . Bradycardia   . Breast nodule 11/15/2014  . Breast pain, left 11/15/2014  . Carotid artery disease ([place])   . Dizziness   . Dyspnea    Previous CPX suggesting possible restrictive physiology, respiratory muscle fatigue, diastolic dysfunction  . Essential hypertension, benign   . Hyperlipidemia   . Oxygen dependent    2 liter  . Pacemaker [place]   . Rib pain on left side 11/15/2014  . Seasonal allergies   . Shingles 05/04/2015  . Stroke ([place])   . Toe fracture, left    left big toe   Past  Surgical History:  Procedure Laterality Date  . COLONOSCOPY  02/22/2012   Procedure: COLONOSCOPY;  Surgeon: Rogene Houston, MD;  [facility];  Service: Endoscopy;  Laterality: N/A;  100  . KNEE SURGERY     bilateral  . PACEMAKER IMPLANT N/A 07/17/2017   Procedure: Pacemaker Implant;  Surgeon: Deboraha Sprang, MD;  Location: [facility];  Service: Cardiovascular;  Laterality: N/A;    Allergies  Allergen Reactions  . Sulfur Swelling and Rash    Outpatient Encounter Medications as of 12/24/2017  Medication Sig  . acetaminophen (TYLENOL) 500 MG tablet Take 1,000 mg by mouth 3 (three) times daily. Max of 3 grams acetaminophen per 24 hrs from all sources.  Jearl Klinefelter ELLIPTA 62.5-25 MCG/INH AEPB Inhale 1 Inhaler into the lungs daily.   Marland Kitchen apixaban (ELIQUIS) 2.5 MG TABS tablet Take 1 tablet (2.5 mg total) by mouth 2 (two) times daily. Resume 07/20/17  . atorvastatin (LIPITOR) 80 MG tablet Take 1 tablet (80 mg total) by mouth daily at 6 PM.  . cycloSPORINE (RESTASIS) 0.05 % ophthalmic emulsion Place 1 drop into both eyes every 12 (twelve) hours.   . diclofenac sodium (VOLTAREN) 1 % GEL Apply 1 application topically 4 (four) times daily as needed.  . diltiazem (CARDIZEM CD) 180 MG 24 hr capsule TAKE ONE CAPSULE BY MOUTH ONCE DAILY.  . furosemide (LASIX) 20 MG  tablet Take 3 tablets (60 mg total) by mouth daily.  Marland Kitchen gabapentin (NEURONTIN) 100 MG capsule Take 1 capsule (100 mg total) by mouth 3 (three) times daily.  . hydrocortisone (ANUSOL-HC) 2.5 % rectal cream Place 1 application rectally 2 (two) times daily as needed for hemorrhoids or anal itching.  . Melatonin 5 MG TABS Take 5 mg by mouth at bedtime as needed.  . Multiple Vitamin (MULITIVITAMIN WITH MINERALS) TABS Take 1 tablet by mouth every other day.   . OXYGEN Inhale 2 L into the lungs 2 (two) times daily. 3:15pm to 11:15pm 11:15pm to 07:15am  . pantoprazole (PROTONIX) 40 MG tablet Take 40 mg by mouth daily.  Vladimir Faster  Glycol-Propyl Glycol (SYSTANE OP) Place 1 drop into both eyes 3 (three) times daily. For dry eyes  . polyethylene glycol (MIRALAX / GLYCOLAX) packet Take 17 g by mouth daily.  . potassium chloride (K-DUR,KLOR-CON) 10 MEQ tablet Take 20 mEq by mouth daily.  Marland Kitchen PROAIR HFA 108 (90 BASE) MCG/ACT inhaler Inhale 2 puffs into the lungs 3 (three) times daily as needed for wheezing (cough).   . traMADol (ULTRAM) 50 MG tablet Take 1 tablet (50 mg total) by mouth 2 (two) times daily.  . [DISCONTINUED] promethazine (PHENERGAN) 12.5 MG tablet Take 12.5 mg by mouth every 6 (six) hours as needed for nausea or vomiting.   No facility-administered encounter medications on file as of 12/24/2017.     Review of Systems  Immunization History  Administered Date(s) Administered  . Influenza-Unspecified 10/10/2013, 09/11/2017  . Pneumococcal Conjugate-13 09/13/2017  . Tdap 09/16/2017  . Zoster 12/05/2017   Pertinent  Health Maintenance Due  Topic Date Due  . DEXA SCAN  01/05/2018 (Originally 01/15/1986)  . PNA vac Low Risk Adult (2 of 2 - PPSV23) 09/13/2018  . INFLUENZA VACCINE  Completed   Fall Risk  09/02/2017  Falls in the past year? Yes  Number falls in past yr: 1  Injury with Fall? No   Functional Status Survey:    Vitals:   12/24/17 1210  BP: (!) 182/76  Pulse: 62  Resp: 18  Temp: (!) 97.4 F (36.3 C)  TempSrc: Oral  SpO2: 94%  Weight: 136 lb 11.2 oz (62 kg)  Height: 5\' 6"  (1.676 m)   Body mass index is 22.06 kg/m. Physical Exam  Labs reviewed: Recent Labs    07/15/17 0805  12/07/17 0930 12/12/17 0800 12/16/17 0630  NA 134*   < > 139 142 141  K 5.5*   < > 4.0 3.5 3.5  CL 103   < > 103 103 103  CO2 19*   < > 23 25 29   GLUCOSE 94   < > 89 112* 92  BUN 53*   < > 24* 22* 22*  CREATININE 1.52*   < > 0.92 0.85 0.95  CALCIUM 8.7*   < > 8.7* 9.0 9.0  MG 2.3  --   --   --   --    < > = values in this interval not displayed.   Recent Labs    09/20/17 1930 09/24/17 0700  10/16/17 1608 10/21/17 0300  AST 26 22 20 18   ALT 19 15 15  13*  ALKPHOS 139* 134*  --  115  BILITOT 1.0 0.8 0.6 0.6  PROT 7.3 6.7 6.6 6.1*  ALBUMIN 4.1 3.7  --  3.3*   Recent Labs    08/20/17 0706 09/20/17 1930 12/06/17 0714 12/07/17 0930  WBC 7.3 7.8 8.7 5.7  NEUTROABS  4.2  --  5.7 3.1  HGB 12.9 13.1 12.2 12.0  HCT 40.5 42.0 40.1 38.6  MCV 85.8 86.2 88.9 88.7  PLT 226 249 205 180   Lab Results  Component Value Date   TSH 1.446 07/15/2017   Lab Results  Component Value Date   HGBA1C 5.5 12/06/2016   Lab Results  Component Value Date   CHOL 113 12/07/2017   HDL 56 12/07/2017   LDLCALC 48 12/07/2017   TRIG 47 12/07/2017   CHOLHDL 2.0 12/07/2017    Significant Diagnostic Results in last 30 days:  No results found.  Assessment/Plan There are no diagnoses linked to this encounter.      Oralia Manis, [place] [phone]  This encounter was created in error - please disregard.

## 2017-12-25 DIAGNOSIS — M25512 Pain in left shoulder: Secondary | ICD-10-CM | POA: Diagnosis not present

## 2017-12-25 DIAGNOSIS — I63312 Cerebral infarction due to thrombosis of left middle cerebral artery: Secondary | ICD-10-CM | POA: Diagnosis not present

## 2017-12-25 DIAGNOSIS — R279 Unspecified lack of coordination: Secondary | ICD-10-CM | POA: Diagnosis not present

## 2017-12-25 DIAGNOSIS — M6281 Muscle weakness (generalized): Secondary | ICD-10-CM | POA: Diagnosis not present

## 2017-12-25 DIAGNOSIS — M1990 Unspecified osteoarthritis, unspecified site: Secondary | ICD-10-CM | POA: Diagnosis not present

## 2017-12-26 DIAGNOSIS — I63312 Cerebral infarction due to thrombosis of left middle cerebral artery: Secondary | ICD-10-CM | POA: Diagnosis not present

## 2017-12-26 DIAGNOSIS — M1990 Unspecified osteoarthritis, unspecified site: Secondary | ICD-10-CM | POA: Diagnosis not present

## 2017-12-26 DIAGNOSIS — M25512 Pain in left shoulder: Secondary | ICD-10-CM | POA: Diagnosis not present

## 2017-12-26 DIAGNOSIS — R279 Unspecified lack of coordination: Secondary | ICD-10-CM | POA: Diagnosis not present

## 2017-12-26 DIAGNOSIS — M6281 Muscle weakness (generalized): Secondary | ICD-10-CM | POA: Diagnosis not present

## 2017-12-27 DIAGNOSIS — M6281 Muscle weakness (generalized): Secondary | ICD-10-CM | POA: Diagnosis not present

## 2017-12-27 DIAGNOSIS — M1990 Unspecified osteoarthritis, unspecified site: Secondary | ICD-10-CM | POA: Diagnosis not present

## 2017-12-27 DIAGNOSIS — I63312 Cerebral infarction due to thrombosis of left middle cerebral artery: Secondary | ICD-10-CM | POA: Diagnosis not present

## 2017-12-27 DIAGNOSIS — M25512 Pain in left shoulder: Secondary | ICD-10-CM | POA: Diagnosis not present

## 2017-12-27 DIAGNOSIS — R279 Unspecified lack of coordination: Secondary | ICD-10-CM | POA: Diagnosis not present

## 2017-12-28 ENCOUNTER — Encounter (HOSPITAL_COMMUNITY)
Admission: RE | Admit: 2017-12-28 | Discharge: 2017-12-28 | Disposition: A | Discharge status: Home / Self Care | Payer: Medicare Other | Source: Skilled Nursing Facility | Attending: Internal Medicine | Admitting: Internal Medicine

## 2017-12-28 DIAGNOSIS — M25512 Pain in left shoulder: Secondary | ICD-10-CM | POA: Diagnosis not present

## 2017-12-28 DIAGNOSIS — R279 Unspecified lack of coordination: Secondary | ICD-10-CM | POA: Diagnosis not present

## 2017-12-28 DIAGNOSIS — I5032 Chronic diastolic (congestive) heart failure: Secondary | ICD-10-CM | POA: Diagnosis not present

## 2017-12-28 DIAGNOSIS — I63312 Cerebral infarction due to thrombosis of left middle cerebral artery: Secondary | ICD-10-CM | POA: Diagnosis not present

## 2017-12-28 DIAGNOSIS — M6281 Muscle weakness (generalized): Secondary | ICD-10-CM | POA: Diagnosis not present

## 2017-12-28 DIAGNOSIS — M1990 Unspecified osteoarthritis, unspecified site: Secondary | ICD-10-CM | POA: Diagnosis not present

## 2017-12-28 LAB — HEPATIC FUNCTION PANEL
ALBUMIN: 3.7 g/dL (ref 3.5–5.0)
ALK PHOS: 132 U/L — AB (ref 38–126)
ALT: 16 U/L (ref 14–54)
AST: 27 U/L (ref 15–41)
BILIRUBIN INDIRECT: 0.8 mg/dL (ref 0.3–0.9)
Bilirubin, Direct: 0.2 mg/dL (ref 0.1–0.5)
TOTAL PROTEIN: 7.1 g/dL (ref 6.5–8.1)
Total Bilirubin: 1 mg/dL (ref 0.3–1.2)

## 2017-12-30 DIAGNOSIS — M25512 Pain in left shoulder: Secondary | ICD-10-CM | POA: Diagnosis not present

## 2017-12-30 DIAGNOSIS — R279 Unspecified lack of coordination: Secondary | ICD-10-CM | POA: Diagnosis not present

## 2017-12-30 DIAGNOSIS — M1990 Unspecified osteoarthritis, unspecified site: Secondary | ICD-10-CM | POA: Diagnosis not present

## 2017-12-30 DIAGNOSIS — I63312 Cerebral infarction due to thrombosis of left middle cerebral artery: Secondary | ICD-10-CM | POA: Diagnosis not present

## 2017-12-30 DIAGNOSIS — M6281 Muscle weakness (generalized): Secondary | ICD-10-CM | POA: Diagnosis not present

## 2017-12-31 ENCOUNTER — Encounter (HOSPITAL_COMMUNITY)
Admission: RE | Admit: 2017-12-31 | Discharge: 2017-12-31 | Disposition: A | Discharge status: Home / Self Care | Payer: Medicare Other | Source: Skilled Nursing Facility | Attending: Internal Medicine | Admitting: Internal Medicine

## 2017-12-31 DIAGNOSIS — K8689 Other specified diseases of pancreas: Secondary | ICD-10-CM | POA: Diagnosis not present

## 2017-12-31 DIAGNOSIS — I63312 Cerebral infarction due to thrombosis of left middle cerebral artery: Secondary | ICD-10-CM | POA: Diagnosis not present

## 2017-12-31 DIAGNOSIS — E785 Hyperlipidemia, unspecified: Secondary | ICD-10-CM | POA: Insufficient documentation

## 2017-12-31 DIAGNOSIS — R279 Unspecified lack of coordination: Secondary | ICD-10-CM | POA: Diagnosis not present

## 2017-12-31 DIAGNOSIS — M6281 Muscle weakness (generalized): Secondary | ICD-10-CM | POA: Diagnosis not present

## 2017-12-31 DIAGNOSIS — M1990 Unspecified osteoarthritis, unspecified site: Secondary | ICD-10-CM | POA: Diagnosis not present

## 2017-12-31 DIAGNOSIS — M25512 Pain in left shoulder: Secondary | ICD-10-CM | POA: Diagnosis not present

## 2017-12-31 LAB — BASIC METABOLIC PANEL
Anion gap: 11 (ref 5–15)
BUN: 27 mg/dL — ABNORMAL HIGH (ref 6–20)
CALCIUM: 8.7 mg/dL — AB (ref 8.9–10.3)
CO2: 25 mmol/L (ref 22–32)
Chloride: 104 mmol/L (ref 101–111)
Creatinine, Ser: 0.86 mg/dL (ref 0.44–1.00)
GFR, EST NON AFRICAN AMERICAN: 55 mL/min — AB (ref >60)
Glucose, Bld: 93 mg/dL (ref 65–99)
POTASSIUM: 3.7 mmol/L (ref 3.5–5.1)
Sodium: 140 mmol/L (ref 135–145)

## 2018-01-01 ENCOUNTER — Encounter: Payer: Self-pay | Admitting: Internal Medicine

## 2018-01-01 ENCOUNTER — Non-Acute Institutional Stay (SKILLED_NURSING_FACILITY): Payer: Medicare Other | Admitting: Internal Medicine

## 2018-01-01 DIAGNOSIS — K649 Unspecified hemorrhoids: Secondary | ICD-10-CM

## 2018-01-01 DIAGNOSIS — I5033 Acute on chronic diastolic (congestive) heart failure: Secondary | ICD-10-CM

## 2018-01-01 DIAGNOSIS — I10 Essential (primary) hypertension: Secondary | ICD-10-CM

## 2018-01-01 DIAGNOSIS — G8929 Other chronic pain: Secondary | ICD-10-CM

## 2018-01-01 DIAGNOSIS — I639 Cerebral infarction, unspecified: Secondary | ICD-10-CM

## 2018-01-01 DIAGNOSIS — I482 Chronic atrial fibrillation, unspecified: Secondary | ICD-10-CM

## 2018-01-01 DIAGNOSIS — M1990 Unspecified osteoarthritis, unspecified site: Secondary | ICD-10-CM | POA: Diagnosis not present

## 2018-01-01 DIAGNOSIS — I63312 Cerebral infarction due to thrombosis of left middle cerebral artery: Secondary | ICD-10-CM | POA: Diagnosis not present

## 2018-01-01 DIAGNOSIS — R279 Unspecified lack of coordination: Secondary | ICD-10-CM | POA: Diagnosis not present

## 2018-01-01 DIAGNOSIS — M6281 Muscle weakness (generalized): Secondary | ICD-10-CM | POA: Diagnosis not present

## 2018-01-01 DIAGNOSIS — M25569 Pain in unspecified knee: Secondary | ICD-10-CM

## 2018-01-01 DIAGNOSIS — M25512 Pain in left shoulder: Secondary | ICD-10-CM | POA: Diagnosis not present

## 2018-01-01 NOTE — Progress Notes (Signed)
Location:   [facility] Room Number: 156/W Place of Service:  SNF [phone]) Provider:  Gardiner Fanti, Rene Kocher, MD  Patient Care Team: Virgie Dad, MD as PCP - General (Internal Medicine) Rolm Baptise as Physician Assistant (Internal Medicine)  Extended Emergency Contact Information Primary Emergency Contact: Ashritha, [place], [place] [address] [name] of [place] Phone: [phone] Relation: Son Secondary Emergency Contact: [name], [place] [address] [name] of [place] Phone: [phone] Mobile Phone: [phone] Relation: Son  Code Status:  DNR Goals of care: Advanced Directive information Advanced Directives 01/01/2018  Does Patient Have a Medical Advance Directive? Yes  Type of Advance Directive Out of facility DNR (pink MOST or yellow form)  Does patient want to make changes to medical advance directive? No - Patient declined  Copy of [place] in Chart? No - copy requested  Would patient like information on creating a medical advance directive? No - Patient declined  Pre-existing out of facility DNR order (yellow form or pink MOST form) -     Chief Complaint  Patient presents with  . Medical Management of Chronic Issues    Routine Visit   For medical management of chronic medical issues including diastolic CHF-hypertension-atrial fibrillation-history of CVA with-history of pacemaker- history of hemorrhoids- and chronic joint knee pain  HPI:  Pt is a 84 y.o. female seen today for medical management of chronic diseases.  As noted above-she appears to be stable most recent acute issue has been some weight gain which appears to have stabilized since her Lasix has been increased-her renal function electrolytes appear to have tolerated the increased dose of Lasix.  She does have a history of diastolic CHF.  She recently was started on multiple Norvasc secondary to concerns  of elevated blood pressure blood pressure today actually was somewhat elevated at 1 2/70 will check these daily to keep a log to see if this is consistent-again the Norvasc was just recently started would like to avoid hypotension as well.  Regards atrial fibrillation this appears rate controlled on diltiazem she is on Eliquis for anticoagulation.  She does have a history of CVA with some expressive aphasia-she has been well with supportive care- she is on a statin LDL was 48 on lab done in December 2018.  She is not on aspirin secondary to concerns for GI bleed.  At one point she did have some bleeding thought to be hemorrhoid related she did have pain at x2 but this has largely stabilized not felt to be a good candidate for banding because she is on Eliquis and has advanced age.  Regards to joint pain and chronic knee pain this appears to have stabilized recently on the tramadol he is also on Neurontin.  She also has a history of a dilated pancreatic duct with elevated alkaline phosphatase recent labs showed minimal elevation of the alkaline phosphatase-she does not appear to be symptomatic this has been followed by GI.  She also has a history of bilateral carotid artery stenosis is on Eliquis and son has opted for no aggressive treatment.  She also has a history of pacemaker which is followed by cardiology-she does have a previous history of complete heart block.  Currently she has no acute complaints again other than somewhat elevated r blood pressure vital signs appear to be stable- blood pressure does appear to have  improved since her Norvasc was started   Past Medical History:  Diagnosis Date  . Atrial fibrillation ([place])   . Bradycardia   . Breast nodule 11/15/2014  . Breast pain, left 11/15/2014  . Carotid artery disease ([place])   . Dizziness   . Dyspnea    Previous CPX suggesting possible restrictive physiology, respiratory muscle fatigue, diastolic dysfunction  . Essential  hypertension, benign   . Hyperlipidemia   . Oxygen dependent    2 liter  . Pacemaker [place]   . Rib pain on left side 11/15/2014  . Seasonal allergies   . Shingles 05/04/2015  . Stroke ([place])   . Toe fracture, left    left big toe   Past Surgical History:  Procedure Laterality Date  . COLONOSCOPY  02/22/2012   Procedure: COLONOSCOPY;  Surgeon: Rogene Houston, MD;  [facility];  Service: Endoscopy;  Laterality: N/A;  100  . KNEE SURGERY     bilateral  . PACEMAKER IMPLANT N/A 07/17/2017   Procedure: Pacemaker Implant;  Surgeon: Deboraha Sprang, MD;  Location: [facility];  Service: Cardiovascular;  Laterality: N/A;    Allergies  Allergen Reactions  . Sulfur Swelling and Rash    Outpatient Encounter Medications as of 01/01/2018  Medication Sig  . acetaminophen (TYLENOL) 500 MG tablet Take 1,000 mg by mouth 3 (three) times daily. Max of 3 grams acetaminophen per 24 hrs from all sources.  Marland Kitchen amLODipine (NORVASC) 2.5 MG tablet Take 2.5 mg by mouth daily.  Jearl Klinefelter ELLIPTA 62.5-25 MCG/INH AEPB Inhale 1 Inhaler into the lungs daily.   Marland Kitchen apixaban (ELIQUIS) 2.5 MG TABS tablet Take 1 tablet (2.5 mg total) by mouth 2 (two) times daily. Resume 07/20/17  . atorvastatin (LIPITOR) 80 MG tablet Take 1 tablet (80 mg total) by mouth daily at 6 PM.  . cycloSPORINE (RESTASIS) 0.05 % ophthalmic emulsion Place 1 drop into both eyes every 12 (twelve) hours.   . diclofenac sodium (VOLTAREN) 1 % GEL Apply 1 application topically 4 (four) times daily as needed.  . diltiazem (CARDIZEM CD) 180 MG 24 hr capsule TAKE ONE CAPSULE BY MOUTH ONCE DAILY.  . furosemide (LASIX) 40 MG tablet Take 40 mg by mouth 2 (two) times daily.  Marland Kitchen gabapentin (NEURONTIN) 100 MG capsule Take 1 capsule (100 mg total) by mouth 3 (three) times daily.  . hydrocortisone (ANUSOL-HC) 2.5 % rectal cream Place 1 application rectally 2 (two) times daily as needed for hemorrhoids or anal itching.  . Melatonin 5 MG TABS Take 5 mg  by mouth at bedtime as needed.  . Multiple Vitamin (MULITIVITAMIN WITH MINERALS) TABS Take 1 tablet by mouth every other day.   . OXYGEN Inhale 2 L into the lungs 2 (two) times daily. 3:15pm to 11:15pm 11:15pm to 07:15am  . pantoprazole (PROTONIX) 40 MG tablet Take 40 mg by mouth daily.  Vladimir Faster Glycol-Propyl Glycol (SYSTANE OP) Place 1 drop into both eyes 3 (three) times daily. For dry eyes  . polyethylene glycol (MIRALAX / GLYCOLAX) packet Take 17 g by mouth daily.  . potassium chloride (K-DUR,KLOR-CON) 10 MEQ tablet Take 20 mEq by mouth daily.  Marland Kitchen PROAIR HFA 108 (90 BASE) MCG/ACT inhaler Inhale 2 puffs into the lungs 3 (three) times daily as needed for wheezing (cough).   . traMADol (ULTRAM) 50 MG tablet Take 1 tablet (50 mg total) by mouth 2 (two) times daily.  . [DISCONTINUED] furosemide (LASIX) 20 MG tablet Take 3 tablets (60 mg  total) by mouth daily. (Patient taking differently: Take 40 mg by mouth 2 (two) times daily. )   No facility-administered encounter medications on file as of 01/01/2018.      Review of Systems  General she is not complaining of any fever chills or weight appears to have stabilized at around 135 pounds.  Skin is not complaining of rashes or itching.  Head ears eyes nose mouth and throat does not complain of visual changes from baseline or sore throat.  Respiratory is not complaining of shortness of breath or cough.  Cardiac denies chest pain continues with chronic lower extremity edema this appears relatively baseline.  GI is not complaining of abdominal discomfort at this time nausea vomiting diarrhea or constipation.  GU does not complain of dysuria.  Musculoskeletal at times does complain of some joint knee pain but this appears to be fairly significantly alleviated with the tramadol and Neurontin.  Neurologic does not complain of dizziness headache does have some expressive aphasia with history of CVA.  Psych does not complain of overt anxiety or  depression.     Immunization History  Administered Date(s) Administered  . Influenza-Unspecified 10/10/2013, 09/11/2017  . Pneumococcal Conjugate-13 09/13/2017  . Tdap 09/16/2017  . Zoster 12/05/2017   Pertinent  Health Maintenance Due  Topic Date Due  . DEXA SCAN  01/05/2018 (Originally 01[birth year]/1986)  . PNA vac Low Risk Adult (2 of 2 - PPSV23) 09/13/2018  . INFLUENZA VACCINE  Completed   Fall Risk  09/02/2017  Falls in the past year? Yes  Number falls in past yr: 1  Injury with Fall? No   Functional Status Survey:    Vitals:   01/01/18 1403  BP: 138/74  Pulse: 69  Resp: 18  Temp: 97.6 F (36.4 C)  TempSrc: Oral  SpO2: 98%  Weight: 139 lb 12.8 oz (63.4 kg)  Height: 5' 4"  (1.626 m)  Again manual blood pressure 162/70  Updated weight is 135 pounds Body mass index is 24 kg/m. Physical Exam   In general this is a somewhat frail elderly female in no distress sitting comfortably in her wheelchair.  Her skin is warm and dry.  Eyes visual acuity appears grossly intact she has prescription lenses.  Oropharynx is clear mucous membranes moist.  Chest is clear to auscultation there is no labored breathing.  Heart is regular irregular rate and rhythm without murmur gallop or rub she has mild lower extremity edema nursing staff feels this has somewhat improved.  Abdomen is soft nontender with positive bowel sounds.  Musculoskeletal is able to move all extremities x4 with limited range of motion of her knees bilaterally she does as of her knees.  Neurologic she does have expressive aphasia with somewhat delayed speech-she is alert and moves her extremities at baseline with lower extremity weakness baseline.  Psych she is grossly alert and oriented pleasant and appropriate      Labs reviewed: Recent Labs    07/15/17 0805  12/12/17 0800 12/16/17 0630 12/31/17 0630  NA 134*   < > 142 141 140  K 5.5*   < > 3.5 3.5 3.7  CL 103   < > 103 103 104  CO2 19*   < > 25  29 25   GLUCOSE 94   < > 112* 92 93  BUN 53*   < > 22* 22* 27*  CREATININE 1.52*   < > 0.85 0.95 0.86  CALCIUM 8.7*   < > 9.0 9.0 8.7*  MG 2.3  --   --   --   --    < > =  values in this interval not displayed.   Recent Labs    09/24/17 0700 10/16/17 1608 10/21/17 0300 12/28/17 1040  AST 22 20 18 27   ALT 15 15 13* 16  ALKPHOS 134*  --  115 132*  BILITOT 0.8 0.6 0.6 1.0  PROT 6.7 6.6 6.1* 7.1  ALBUMIN 3.7  --  3.3* 3.7   Recent Labs    08/20/17 0706 09/20/17 1930 12/06/17 0714 12/07/17 0930  WBC 7.3 7.8 8.7 5.7  NEUTROABS 4.2  --  5.7 3.1  HGB 12.9 13.1 12.2 12.0  HCT 40.5 42.0 40.1 38.6  MCV 85.8 86.2 88.9 88.7  PLT 226 249 205 180   Lab Results  Component Value Date   TSH 1.446 07/15/2017   Lab Results  Component Value Date   HGBA1C 5.5 12/06/2016   Lab Results  Component Value Date   CHOL 113 12/07/2017   HDL 56 12/07/2017   LDLCALC 48 12/07/2017   TRIG 47 12/07/2017   CHOLHDL 2.0 12/07/2017    Significant Diagnostic Results in last 30 days:  No results found.  Assessment/Plan  #1 diastolic CHF her Lasix has been increased and renal function appears to be stable but this will need to be updated next week-weight has stabilized clinically appears to be stable edema appears to be stabilized does not complain of shortness of breath or increased cough from baseline.  With #2 hypertension still some variable blood pressures here I got a manual in the 160s and another reading was in the 130s at this point will monitor with blood pressures daily-she is on low-dose Norvasc.  3.  History of atrial fibrillation this appears rate controlled on diltiazem she is on Eliquis for anticoagulation.  4.  History of complete heart block she is status post pacemaker and appears to have tolerated this well she is followed by cardiology.  5.  History of CVA with expressive aphasia this appears to be relatively stable she is on Eliquis she also is on a statin LDL was 48 on lab  done in December 2018.  She is no longer on aspirin secondary to concerns of GI bleed risk.  6.  History of hemorrhoid with bleeding this is been stable now for some time she has not felt to be a good candidate for banding secondary to advanced age and being on Eliquis.  7.  History of chronic joint knee pain again this appears to be relatively controlled with the tramadol and Neurontin.  8.  History of dilated pancreatic duct with elevated alk phosphatase-recent alkaline phosphatase was minimally elevated she does not appear to be symptomatic per GI this is thought to be stable at this point will monitor.  9.  History of bilateral carotid artery stenosis again she is on Eliquis her son does not really desire any aggressive workup for this.  10.  History of COPD this is been stable now for some time she is on inhalers   [phone]

## 2018-01-02 DIAGNOSIS — M6281 Muscle weakness (generalized): Secondary | ICD-10-CM | POA: Diagnosis not present

## 2018-01-02 DIAGNOSIS — I63312 Cerebral infarction due to thrombosis of left middle cerebral artery: Secondary | ICD-10-CM | POA: Diagnosis not present

## 2018-01-02 DIAGNOSIS — R279 Unspecified lack of coordination: Secondary | ICD-10-CM | POA: Diagnosis not present

## 2018-01-02 DIAGNOSIS — M1990 Unspecified osteoarthritis, unspecified site: Secondary | ICD-10-CM | POA: Diagnosis not present

## 2018-01-02 DIAGNOSIS — M25512 Pain in left shoulder: Secondary | ICD-10-CM | POA: Diagnosis not present

## 2018-01-03 DIAGNOSIS — I63312 Cerebral infarction due to thrombosis of left middle cerebral artery: Secondary | ICD-10-CM | POA: Diagnosis not present

## 2018-01-03 DIAGNOSIS — M25512 Pain in left shoulder: Secondary | ICD-10-CM | POA: Diagnosis not present

## 2018-01-03 DIAGNOSIS — R279 Unspecified lack of coordination: Secondary | ICD-10-CM | POA: Diagnosis not present

## 2018-01-03 DIAGNOSIS — M1990 Unspecified osteoarthritis, unspecified site: Secondary | ICD-10-CM | POA: Diagnosis not present

## 2018-01-03 DIAGNOSIS — M6281 Muscle weakness (generalized): Secondary | ICD-10-CM | POA: Diagnosis not present

## 2018-01-05 ENCOUNTER — Encounter: Payer: Self-pay | Admitting: Internal Medicine

## 2018-01-06 ENCOUNTER — Encounter (HOSPITAL_COMMUNITY)
Admission: RE | Admit: 2018-01-06 | Discharge: 2018-01-06 | Disposition: A | Discharge status: Home / Self Care | Payer: Medicare Other | Source: Skilled Nursing Facility | Attending: *Deleted | Admitting: *Deleted

## 2018-01-06 DIAGNOSIS — I63312 Cerebral infarction due to thrombosis of left middle cerebral artery: Secondary | ICD-10-CM | POA: Diagnosis not present

## 2018-01-06 DIAGNOSIS — I5032 Chronic diastolic (congestive) heart failure: Secondary | ICD-10-CM | POA: Diagnosis not present

## 2018-01-06 DIAGNOSIS — M6281 Muscle weakness (generalized): Secondary | ICD-10-CM | POA: Diagnosis not present

## 2018-01-06 DIAGNOSIS — R279 Unspecified lack of coordination: Secondary | ICD-10-CM | POA: Diagnosis not present

## 2018-01-06 DIAGNOSIS — M1990 Unspecified osteoarthritis, unspecified site: Secondary | ICD-10-CM | POA: Diagnosis not present

## 2018-01-06 DIAGNOSIS — M25512 Pain in left shoulder: Secondary | ICD-10-CM | POA: Diagnosis not present

## 2018-01-06 LAB — BASIC METABOLIC PANEL
Anion gap: 10 (ref 5–15)
BUN: 25 mg/dL — AB (ref 6–20)
CHLORIDE: 103 mmol/L (ref 101–111)
CO2: 27 mmol/L (ref 22–32)
CREATININE: 0.91 mg/dL (ref 0.44–1.00)
Calcium: 8.7 mg/dL — ABNORMAL LOW (ref 8.9–10.3)
GFR calc Af Amer: 60 mL/min — ABNORMAL LOW (ref >60)
GFR, EST NON AFRICAN AMERICAN: 52 mL/min — AB (ref >60)
GLUCOSE: 85 mg/dL (ref 65–99)
Potassium: 4.1 mmol/L (ref 3.5–5.1)
Sodium: 140 mmol/L (ref 135–145)

## 2018-01-07 DIAGNOSIS — M6281 Muscle weakness (generalized): Secondary | ICD-10-CM | POA: Diagnosis not present

## 2018-01-07 DIAGNOSIS — M1990 Unspecified osteoarthritis, unspecified site: Secondary | ICD-10-CM | POA: Diagnosis not present

## 2018-01-07 DIAGNOSIS — M25512 Pain in left shoulder: Secondary | ICD-10-CM | POA: Diagnosis not present

## 2018-01-07 DIAGNOSIS — R279 Unspecified lack of coordination: Secondary | ICD-10-CM | POA: Diagnosis not present

## 2018-01-07 DIAGNOSIS — I63312 Cerebral infarction due to thrombosis of left middle cerebral artery: Secondary | ICD-10-CM | POA: Diagnosis not present

## 2018-01-08 ENCOUNTER — Encounter: Payer: Self-pay | Admitting: Internal Medicine

## 2018-01-08 ENCOUNTER — Non-Acute Institutional Stay (SKILLED_NURSING_FACILITY): Payer: Medicare Other | Admitting: Internal Medicine

## 2018-01-08 DIAGNOSIS — M25512 Pain in left shoulder: Secondary | ICD-10-CM

## 2018-01-08 DIAGNOSIS — R279 Unspecified lack of coordination: Secondary | ICD-10-CM | POA: Diagnosis not present

## 2018-01-08 DIAGNOSIS — M6281 Muscle weakness (generalized): Secondary | ICD-10-CM | POA: Diagnosis not present

## 2018-01-08 DIAGNOSIS — I5033 Acute on chronic diastolic (congestive) heart failure: Secondary | ICD-10-CM

## 2018-01-08 DIAGNOSIS — I63312 Cerebral infarction due to thrombosis of left middle cerebral artery: Secondary | ICD-10-CM | POA: Diagnosis not present

## 2018-01-08 DIAGNOSIS — M1990 Unspecified osteoarthritis, unspecified site: Secondary | ICD-10-CM | POA: Diagnosis not present

## 2018-01-08 NOTE — Progress Notes (Signed)
Location:   [facility] Room Number: 156/W Place of Service:  SNF [phone]) Provider:  Freddi Starr, MD  Patient Care Team: Virgie Dad, MD as PCP - General (Internal Medicine) Rolm Baptise as Physician Assistant (Internal Medicine)  Extended Emergency Contact Information Primary Emergency Contact: Adin Hector, [place] [address] [name] of [place] Phone: [phone] Relation: Son Secondary Emergency Contact: [name], [place] [address] [name] of [place] Phone: [phone] Mobile Phone: [phone] Relation: Son  Code Status:  DNR Goals of care: Advanced Directive information Advanced Directives 01/08/2018  Does Patient Have a Medical Advance Directive? Yes  Type of Advance Directive Out of facility DNR (pink MOST or yellow form)  Does patient want to make changes to medical advance directive? No - Patient declined  Copy of [place] in Chart? No - copy requested  Would patient like information on creating a medical advance directive? No - Patient declined  Pre-existing out of facility DNR order (yellow form or pink MOST form) -    Chief complaint acute visit secondary to left shoulder pain   HPI:  Pt is a 84 y.o. female seen today for an acute visit fo complains of left shoulder pain since last night.  Patient is a long-term resident of facility and has a history of diastolic CHF as well as hypertension atrial fibrillation history CVA as well as history of pacemaker-hemorrhoids and chronic joint and knee pain.  The patient states that last night she was pulling a blanket over her with her left arm when she felt acute discomfort.  In the left shoulder area  She says the discomfort persists to some extent-she does receive Tylenol and tramadol intermittently for pain she tells me this does help some but according to nursing staff she still having some pain  complaints to staff of this.  Currently she has a Tylenol order for thousand milligrams 3 times a day at 9 AM 2 PM and 9 PM  - And has tramadol-twice a day at 6 AM and 6 PM.  Apparently stronger pain medications narcotics were not tolerated well  Vital signs appear to be stable she is sitting comfortably in her wheelchair continues to be bright alert does have some expressive aphasia is a result of her CVA.     Past Medical History:  Diagnosis Date  . Atrial fibrillation ([place])   . Bradycardia   . Breast nodule 11/15/2014  . Breast pain, left 11/15/2014  . Carotid artery disease ([place])   . Dizziness   . Dyspnea    Previous CPX suggesting possible restrictive physiology, respiratory muscle fatigue, diastolic dysfunction  . Essential hypertension, benign   . Hyperlipidemia   . Oxygen dependent    2 liter  . Pacemaker [place]   . Rib pain on left side 11/15/2014  . Seasonal allergies   . Shingles 05/04/2015  . Stroke ([place])   . Toe fracture, left    left big toe   Past Surgical History:  Procedure Laterality Date  . COLONOSCOPY  02/22/2012   Procedure: COLONOSCOPY;  Surgeon: Rogene Houston, MD;  Location: [facility];  Service: Endoscopy;  Laterality: N/A;  100  . KNEE SURGERY     bilateral  . PACEMAKER IMPLANT N/A 07/17/2017   Procedure: Pacemaker Implant;  Surgeon: Deboraha Sprang, MD;  Location: [facility]  INVASIVE CV LAB;  Service: Cardiovascular;  Laterality: N/A;    Allergies  Allergen Reactions  . Sulfur Swelling and Rash    Outpatient Encounter Medications as of 01/08/2018  Medication Sig  . acetaminophen (TYLENOL) 500 MG tablet Take 1,000 mg by mouth 3 (three) times daily. Max of 3 grams acetaminophen per 24 hrs from all sources.  Marland Kitchen amLODipine (NORVASC) 2.5 MG tablet Take 2.5 mg by mouth daily.  Jearl Klinefelter ELLIPTA 62.5-25 MCG/INH AEPB Inhale 1 Inhaler into the lungs daily.   Marland Kitchen apixaban (ELIQUIS) 2.5 MG TABS tablet Take 1 tablet (2.5 mg total) by mouth 2 (two) times daily.  Resume 07/20/17  . atorvastatin (LIPITOR) 80 MG tablet Take 1 tablet (80 mg total) by mouth daily at 6 PM.  . cycloSPORINE (RESTASIS) 0.05 % ophthalmic emulsion Place 1 drop into both eyes every 12 (twelve) hours.   . diclofenac sodium (VOLTAREN) 1 % GEL Apply 1 application topically 4 (four) times daily as needed.  . diltiazem (CARDIZEM CD) 180 MG 24 hr capsule TAKE ONE CAPSULE BY MOUTH ONCE DAILY.  . furosemide (LASIX) 40 MG tablet Take 40 mg by mouth 2 (two) times daily.  Marland Kitchen gabapentin (NEURONTIN) 100 MG capsule Take 1 capsule (100 mg total) by mouth 3 (three) times daily.  . hydrocortisone (ANUSOL-HC) 2.5 % rectal cream Place 1 application rectally 2 (two) times daily as needed for hemorrhoids or anal itching.  . Melatonin 5 MG TABS Take 5 mg by mouth at bedtime as needed.  . Multiple Vitamin (MULITIVITAMIN WITH MINERALS) TABS Take 1 tablet by mouth every other day.   . OXYGEN Inhale 2 L into the lungs 2 (two) times daily. 3:15pm to 11:15pm 11:15pm to 07:15am  . pantoprazole (PROTONIX) 40 MG tablet Take 40 mg by mouth daily.  Vladimir Faster Glycol-Propyl Glycol (SYSTANE OP) Place 1 drop into both eyes 3 (three) times daily. For dry eyes  . polyethylene glycol (MIRALAX / GLYCOLAX) packet Take 17 g by mouth daily.  . potassium chloride (K-DUR,KLOR-CON) 10 MEQ tablet Take 20 mEq by mouth daily.  Marland Kitchen PROAIR HFA 108 (90 BASE) MCG/ACT inhaler Inhale 2 puffs into the lungs 3 (three) times daily as needed for wheezing (cough).   . traMADol (ULTRAM) 50 MG tablet Take 1 tablet (50 mg total) by mouth 2 (two) times daily.  . [DISCONTINUED] Albuterol (VENTOLIN IN) Inhale into the lungs.     No facility-administered encounter medications on file as of 01/08/2018.     Review of Systems   In general she is not complaining of any fever chills says her left shoulder does hurt.  Skin does not complain of rashes itching or bruising beyond baseline.  Head ears eyes nose mouth and throat no complaints of visual  changes from baseline or sore throat.  Respiratory is not complaining of difficulty breathing or cough.  Cardiac does not complain of chest pain lower extremity edema appears to be relatively stabilized.  GI does not complain of abdominal discomfort nausea vomiting diarrhea constipation.  Musculoskeletal is not complaining of left shoulder pain as noted above does not really complain of numbness is not really complain of other joint pain currently.  Neurologic not complaining of dizziness headache or numbness at this time she is on Neurontin for suspected neuropathy pain.  Psych does not complain of overt anxiety or depression appears to be comfortable but is concerned about her left shoulder discomfort  Immunization History  Administered Date(s) Administered  . Influenza-Unspecified 10/10/2013, 09/11/2017  . Pneumococcal  Conjugate-13 09/13/2017  . Tdap 09/16/2017  . Zoster 12/05/2017   Pertinent  Health Maintenance Due  Topic Date Due  . DEXA SCAN  01/15/1986  . PNA vac Low Risk Adult (2 of 2 - PPSV23) 09/13/2018  . INFLUENZA VACCINE  Completed   Fall Risk  09/02/2017  Falls in the past year? Yes  Number falls in past yr: 1  Injury with Fall? No   Functional Status Survey:     She is afebrile pulse of 66 respirations of 17 blood pressure taken manually 140/62. Weight is 143.6 pounds Physical Exam   In general this is a somewhat frail elderly female in no distress sitting comfortably in her chair.  Her skin is warm and dry.  Eyes she has prescription lenses visual acuity appears grossly intact.  Oropharynx is clear mucous membranes moist.  Chest is clear to auscultation with shallow air entry there is no labored breathing.  The edema lower extremity appears to be relatively baseline with previous exam--  Abdomen is soft nontender with positive bowel sounds.  Musculoskeletal does move all extremities x4 however left upper extremity is limited she is able to shrug her  shoulder but has some discomfort here is able to flex and extend her left arm grip strength appears to be intact but does have some difficulty raising her left arm-there is some tenderness to palpation over her shoulder area--- cannot appreciate any overt deformity.  Neurologic is grossly intact she does have expressive aphasia with delayed speech but appears able to move her extremities at baseline with baseline lower extremity weakness  Psych she is grossly alert and oriented pleasant and appropriate    Labs reviewed: Recent Labs    07/15/17 0805  12/16/17 0630 12/31/17 0630 01/06/18 0800  NA 134*   < > 141 140 140  K 5.5*   < > 3.5 3.7 4.1  CL 103   < > 103 104 103  CO2 19*   < > 29 25 27   GLUCOSE 94   < > 92 93 85  BUN 53*   < > 22* 27* 25*  CREATININE 1.52*   < > 0.95 0.86 0.91  CALCIUM 8.7*   < > 9.0 8.7* 8.7*  MG 2.3  --   --   --   --    < > = values in this interval not displayed.   Recent Labs    09/24/17 0700 10/16/17 1608 10/21/17 0300 12/28/17 1040  AST 22 20 18 27   ALT 15 15 13* 16  ALKPHOS 134*  --  115 132*  BILITOT 0.8 0.6 0.6 1.0  PROT 6.7 6.6 6.1* 7.1  ALBUMIN 3.7  --  3.3* 3.7   Recent Labs    08/20/17 0706 09/20/17 1930 12/06/17 0714 12/07/17 0930  WBC 7.3 7.8 8.7 5.7  NEUTROABS 4.2  --  5.7 3.1  HGB 12.9 13.1 12.2 12.0  HCT 40.5 42.0 40.1 38.6  MCV 85.8 86.2 88.9 88.7  PLT 226 249 205 180   Lab Results  Component Value Date   TSH 1.446 07/15/2017   Lab Results  Component Value Date   HGBA1C 5.5 12/06/2016   Lab Results  Component Value Date   CHOL 113 12/07/2017   HDL 56 12/07/2017   LDLCALC 48 12/07/2017   TRIG 47 12/07/2017   CHOLHDL 2.0 12/07/2017    Significant Diagnostic Results in last 30 days:  No results found.  Assessment/Plan  #1 left shoulder discomfort-will order a x-ray of  her shoulder clavicle and humerus--- For pain management will essentially invert her tramadol and Tylenol dosing so she gets her tramadol  3 times a day and Tylenol twice a day-again she does not tolerate stronger narcotics very well but hopefully slight titration of the tramadol will give her some relief.  This will need to be monitored.  2.  History of diastolic CHF her Lasix was recently increased to 40 mg twice a day we have had some variable weights weight did decrease down to 135 pounds appears on January 22 however weight yesterday was 143.6-I suspect this could be scale variation and not being weighed at the same time of the day-edema appears to be relatively baseline with what I saw previously-will order a reweight tomorrow to get a more accurate looki--- clinically she appears stable although fragile in this regards.    Her renal function appears to have tolerated the increased dose of Lasix which is encouraging again will await reweight.  Also will await x-ray results and monitoring of her pain and clinical status.  [id number]

## 2018-01-09 DIAGNOSIS — M1990 Unspecified osteoarthritis, unspecified site: Secondary | ICD-10-CM | POA: Diagnosis not present

## 2018-01-09 DIAGNOSIS — R279 Unspecified lack of coordination: Secondary | ICD-10-CM | POA: Diagnosis not present

## 2018-01-09 DIAGNOSIS — M25512 Pain in left shoulder: Secondary | ICD-10-CM | POA: Diagnosis not present

## 2018-01-09 DIAGNOSIS — I63312 Cerebral infarction due to thrombosis of left middle cerebral artery: Secondary | ICD-10-CM | POA: Diagnosis not present

## 2018-01-09 DIAGNOSIS — M6281 Muscle weakness (generalized): Secondary | ICD-10-CM | POA: Diagnosis not present

## 2018-01-10 ENCOUNTER — Other Ambulatory Visit: Payer: Self-pay

## 2018-01-10 DIAGNOSIS — M6281 Muscle weakness (generalized): Secondary | ICD-10-CM | POA: Diagnosis not present

## 2018-01-10 DIAGNOSIS — R279 Unspecified lack of coordination: Secondary | ICD-10-CM | POA: Diagnosis not present

## 2018-01-10 DIAGNOSIS — M1990 Unspecified osteoarthritis, unspecified site: Secondary | ICD-10-CM | POA: Diagnosis not present

## 2018-01-10 DIAGNOSIS — M25512 Pain in left shoulder: Secondary | ICD-10-CM | POA: Diagnosis not present

## 2018-01-10 DIAGNOSIS — I63312 Cerebral infarction due to thrombosis of left middle cerebral artery: Secondary | ICD-10-CM | POA: Diagnosis not present

## 2018-01-10 MED ORDER — TRAMADOL HCL 50 MG PO TABS: 50.0000 mg | ORAL_TABLET | Freq: Two times a day (BID) | ORAL | 0 refills | Status: DC

## 2018-01-10 NOTE — Telephone Encounter (Signed)
RX Fax for [facility]@ [phone]  

## 2018-01-13 DIAGNOSIS — R279 Unspecified lack of coordination: Secondary | ICD-10-CM | POA: Diagnosis not present

## 2018-01-13 DIAGNOSIS — M6281 Muscle weakness (generalized): Secondary | ICD-10-CM | POA: Diagnosis not present

## 2018-01-13 DIAGNOSIS — M25512 Pain in left shoulder: Secondary | ICD-10-CM | POA: Diagnosis not present

## 2018-01-13 DIAGNOSIS — M1990 Unspecified osteoarthritis, unspecified site: Secondary | ICD-10-CM | POA: Diagnosis not present

## 2018-01-13 DIAGNOSIS — I63312 Cerebral infarction due to thrombosis of left middle cerebral artery: Secondary | ICD-10-CM | POA: Diagnosis not present

## 2018-01-15 DIAGNOSIS — M25512 Pain in left shoulder: Secondary | ICD-10-CM | POA: Diagnosis not present

## 2018-01-15 DIAGNOSIS — M1990 Unspecified osteoarthritis, unspecified site: Secondary | ICD-10-CM | POA: Diagnosis not present

## 2018-01-15 DIAGNOSIS — M6281 Muscle weakness (generalized): Secondary | ICD-10-CM | POA: Diagnosis not present

## 2018-01-15 DIAGNOSIS — I63312 Cerebral infarction due to thrombosis of left middle cerebral artery: Secondary | ICD-10-CM | POA: Diagnosis not present

## 2018-01-15 DIAGNOSIS — R279 Unspecified lack of coordination: Secondary | ICD-10-CM | POA: Diagnosis not present

## 2018-01-16 DIAGNOSIS — I63312 Cerebral infarction due to thrombosis of left middle cerebral artery: Secondary | ICD-10-CM | POA: Diagnosis not present

## 2018-01-16 DIAGNOSIS — M6281 Muscle weakness (generalized): Secondary | ICD-10-CM | POA: Diagnosis not present

## 2018-01-16 DIAGNOSIS — R279 Unspecified lack of coordination: Secondary | ICD-10-CM | POA: Diagnosis not present

## 2018-01-16 DIAGNOSIS — M1990 Unspecified osteoarthritis, unspecified site: Secondary | ICD-10-CM | POA: Diagnosis not present

## 2018-01-16 DIAGNOSIS — M25512 Pain in left shoulder: Secondary | ICD-10-CM | POA: Diagnosis not present

## 2018-01-20 DIAGNOSIS — R279 Unspecified lack of coordination: Secondary | ICD-10-CM | POA: Diagnosis not present

## 2018-01-20 DIAGNOSIS — M1990 Unspecified osteoarthritis, unspecified site: Secondary | ICD-10-CM | POA: Diagnosis not present

## 2018-01-20 DIAGNOSIS — M25512 Pain in left shoulder: Secondary | ICD-10-CM | POA: Diagnosis not present

## 2018-01-20 DIAGNOSIS — I63312 Cerebral infarction due to thrombosis of left middle cerebral artery: Secondary | ICD-10-CM | POA: Diagnosis not present

## 2018-01-20 DIAGNOSIS — M6281 Muscle weakness (generalized): Secondary | ICD-10-CM | POA: Diagnosis not present

## 2018-01-21 ENCOUNTER — Ambulatory Visit (INDEPENDENT_AMBULATORY_CARE_PROVIDER_SITE_OTHER): Payer: Medicare Other | Admitting: *Deleted

## 2018-01-21 DIAGNOSIS — I442 Atrioventricular block, complete: Secondary | ICD-10-CM | POA: Diagnosis not present

## 2018-01-21 NOTE — Progress Notes (Signed)
Remote pacemaker transmission.   

## 2018-01-22 ENCOUNTER — Encounter: Payer: Self-pay | Admitting: Cardiology

## 2018-01-22 DIAGNOSIS — I63312 Cerebral infarction due to thrombosis of left middle cerebral artery: Secondary | ICD-10-CM | POA: Diagnosis not present

## 2018-01-22 DIAGNOSIS — R279 Unspecified lack of coordination: Secondary | ICD-10-CM | POA: Diagnosis not present

## 2018-01-22 DIAGNOSIS — M6281 Muscle weakness (generalized): Secondary | ICD-10-CM | POA: Diagnosis not present

## 2018-01-22 DIAGNOSIS — M1990 Unspecified osteoarthritis, unspecified site: Secondary | ICD-10-CM | POA: Diagnosis not present

## 2018-01-22 DIAGNOSIS — M25512 Pain in left shoulder: Secondary | ICD-10-CM | POA: Diagnosis not present

## 2018-01-23 DIAGNOSIS — M1990 Unspecified osteoarthritis, unspecified site: Secondary | ICD-10-CM | POA: Diagnosis not present

## 2018-01-23 DIAGNOSIS — I63312 Cerebral infarction due to thrombosis of left middle cerebral artery: Secondary | ICD-10-CM | POA: Diagnosis not present

## 2018-01-23 DIAGNOSIS — R279 Unspecified lack of coordination: Secondary | ICD-10-CM | POA: Diagnosis not present

## 2018-01-23 DIAGNOSIS — M6281 Muscle weakness (generalized): Secondary | ICD-10-CM | POA: Diagnosis not present

## 2018-01-23 DIAGNOSIS — M25512 Pain in left shoulder: Secondary | ICD-10-CM | POA: Diagnosis not present

## 2018-01-27 DIAGNOSIS — M25512 Pain in left shoulder: Secondary | ICD-10-CM | POA: Diagnosis not present

## 2018-01-27 DIAGNOSIS — M6281 Muscle weakness (generalized): Secondary | ICD-10-CM | POA: Diagnosis not present

## 2018-01-27 DIAGNOSIS — I63312 Cerebral infarction due to thrombosis of left middle cerebral artery: Secondary | ICD-10-CM | POA: Diagnosis not present

## 2018-01-27 DIAGNOSIS — M1990 Unspecified osteoarthritis, unspecified site: Secondary | ICD-10-CM | POA: Diagnosis not present

## 2018-01-27 DIAGNOSIS — R279 Unspecified lack of coordination: Secondary | ICD-10-CM | POA: Diagnosis not present

## 2018-01-29 DIAGNOSIS — M25512 Pain in left shoulder: Secondary | ICD-10-CM | POA: Diagnosis not present

## 2018-01-29 DIAGNOSIS — I63312 Cerebral infarction due to thrombosis of left middle cerebral artery: Secondary | ICD-10-CM | POA: Diagnosis not present

## 2018-01-29 DIAGNOSIS — R279 Unspecified lack of coordination: Secondary | ICD-10-CM | POA: Diagnosis not present

## 2018-01-29 DIAGNOSIS — M1990 Unspecified osteoarthritis, unspecified site: Secondary | ICD-10-CM | POA: Diagnosis not present

## 2018-01-29 DIAGNOSIS — M6281 Muscle weakness (generalized): Secondary | ICD-10-CM | POA: Diagnosis not present

## 2018-01-30 DIAGNOSIS — M25512 Pain in left shoulder: Secondary | ICD-10-CM | POA: Diagnosis not present

## 2018-01-30 DIAGNOSIS — R279 Unspecified lack of coordination: Secondary | ICD-10-CM | POA: Diagnosis not present

## 2018-01-30 DIAGNOSIS — M1990 Unspecified osteoarthritis, unspecified site: Secondary | ICD-10-CM | POA: Diagnosis not present

## 2018-01-30 DIAGNOSIS — M6281 Muscle weakness (generalized): Secondary | ICD-10-CM | POA: Diagnosis not present

## 2018-01-30 DIAGNOSIS — I63312 Cerebral infarction due to thrombosis of left middle cerebral artery: Secondary | ICD-10-CM | POA: Diagnosis not present

## 2018-01-31 LAB — CUP PACEART REMOTE DEVICE CHECK
Battery Remaining Percentage: 95.5 %
Battery Voltage: 3.01 V
Brady Statistic RV Percent Paced: 99 %
Implantable Pulse Generator Implant Date: 20180808
Lead Channel Impedance Value: 600 Ohm
Lead Channel Pacing Threshold Pulse Width: 0.5 ms
Lead Channel Setting Pacing Amplitude: 2.5 V
Lead Channel Setting Pacing Pulse Width: 0.5 ms
MDC IDC LEAD IMPLANT DT: 20180808
MDC IDC LEAD LOCATION: 753860
MDC IDC MSMT BATTERY REMAINING LONGEVITY: 119 mo
MDC IDC MSMT LEADCHNL RV PACING THRESHOLD AMPLITUDE: 0.5 V
MDC IDC MSMT LEADCHNL RV SENSING INTR AMPL: 2.8 mV
MDC IDC PG SERIAL: 7957820
MDC IDC SESS DTM: 20190212070032
MDC IDC SET LEADCHNL RV SENSING SENSITIVITY: 0.5 mV
Pulse Gen Model: 1272

## 2018-02-03 DIAGNOSIS — M25512 Pain in left shoulder: Secondary | ICD-10-CM | POA: Diagnosis not present

## 2018-02-03 DIAGNOSIS — R279 Unspecified lack of coordination: Secondary | ICD-10-CM | POA: Diagnosis not present

## 2018-02-03 DIAGNOSIS — I63312 Cerebral infarction due to thrombosis of left middle cerebral artery: Secondary | ICD-10-CM | POA: Diagnosis not present

## 2018-02-03 DIAGNOSIS — M6281 Muscle weakness (generalized): Secondary | ICD-10-CM | POA: Diagnosis not present

## 2018-02-03 DIAGNOSIS — M1990 Unspecified osteoarthritis, unspecified site: Secondary | ICD-10-CM | POA: Diagnosis not present

## 2018-02-05 ENCOUNTER — Encounter: Payer: Self-pay | Admitting: Internal Medicine

## 2018-02-05 ENCOUNTER — Non-Acute Institutional Stay (SKILLED_NURSING_FACILITY): Payer: Medicare Other | Admitting: Internal Medicine

## 2018-02-05 DIAGNOSIS — J449 Chronic obstructive pulmonary disease, unspecified: Secondary | ICD-10-CM

## 2018-02-05 DIAGNOSIS — I10 Essential (primary) hypertension: Secondary | ICD-10-CM | POA: Diagnosis not present

## 2018-02-05 DIAGNOSIS — I5032 Chronic diastolic (congestive) heart failure: Secondary | ICD-10-CM

## 2018-02-05 DIAGNOSIS — I639 Cerebral infarction, unspecified: Secondary | ICD-10-CM

## 2018-02-05 DIAGNOSIS — R600 Localized edema: Secondary | ICD-10-CM | POA: Diagnosis not present

## 2018-02-05 DIAGNOSIS — I482 Chronic atrial fibrillation, unspecified: Secondary | ICD-10-CM

## 2018-02-05 NOTE — Progress Notes (Signed)
Location:   [facility] Room Number: 156/W Place of Service:  SNF [phone]) Provider:  Freddi Starr, MD  Patient Care Team: Virgie Dad, MD as PCP - General (Internal Medicine) Rolm Baptise as Physician Assistant (Internal Medicine)  Extended Emergency Contact Information Primary Emergency Contact: Adin Hector, [place] [address] [name] of [place] Phone: [phone] Relation: Son Secondary Emergency Contact: [name], [place] [address] [name] of [place] Phone: [phone] Mobile Phone: [phone] Relation: Son  Code Status:  DNR Goals of care: Advanced Directive information Advanced Directives 02/05/2018  Does Patient Have a Medical Advance Directive? Yes  Type of Advance Directive Out of facility DNR (pink MOST or yellow form)  Does patient want to make changes to medical advance directive? No - Patient declined  Copy of [place] in Chart? No - copy requested  Would patient like information on creating a medical advance directive? No - Patient declined  Pre-existing out of facility DNR order (yellow form or pink MOST form) -     Chief Complaint  Patient presents with  . Medical Management of Chronic Issues    Routine Visit  For medical management of chronic medical conditions  including diastolic CHF-atrial fibrillation-hypertension-history of pacemaker with complete heart block-hypertension-chronic joint pain   HPI:  Pt is a 84 y.o. female seen today for medical management of chronic diseases.  As noted above.  She appears to be stable-at one point she had some weight gain her Lasix was increased and weight appears to have stabilized at around 140 pounds.  Edema appears to be relatively baseline.  We have also monitor her renal function which appears to be stable but will update this.  I saw her most recently for complaints of left shoulder pain  x-ray really did not show any acute process we did invert her tramadol and Tylenol dosing hoping this will help and apparently per nursing is helps some but she also receives as needed Voltaren and receives topical treatment from physical therapy  Regards atrial fibrillation-this appears rate controlled on diltiazem she is also on Eliquis for anticoagulation.  This appears to be rate controlled.  She does have a history of complete heart block and does have a pacemaker which is monitored by cardiology.  She does not appear to be symptomatic in this regards.  Regards to history of CVA she still has some expressive aphasia but does well with supportive care-she is on a statin and LDL was 48 on the lab done in December 2018.  She is also got a history of hypertension she is on low-dose Norvasc in addition to the [place] this appears to be relatively stable blood pressures 120/63 today at this point will monitor.   C she also has a listed history of a dilated pancreatic duct and has had alcohol-related phosphatase elevation in the past but this appears to have stabilized last alkaline phosphatase was 132 back in mid January which is minimally elevated she does not appear to be symptomatic.  She has been followed by GI.  Regards to bilateral carotid artery stenosis she is on Eliquis and family has opted for no aggressive treatment which is reasonable considering her comorbidities and advanced age.  Currently she has no acute complaints she still has discomfort at times of her left shoulder as noted above.  Past Medical History:  Diagnosis Date  . Atrial fibrillation ([place])   . Bradycardia   . Breast nodule 11/15/2014  . Breast pain, left 11/15/2014  . Carotid artery disease ([place])   . Dizziness   . Dyspnea    Previous CPX suggesting possible restrictive physiology, respiratory muscle fatigue, diastolic dysfunction  . Essential hypertension, benign   . Hyperlipidemia   . Oxygen  dependent    2 liter  . Pacemaker [place]   . Rib pain on left side 11/15/2014  . Seasonal allergies   . Shingles 05/04/2015  . Stroke ([place])   . Toe fracture, left    left big toe   Past Surgical History:  Procedure Laterality Date  . COLONOSCOPY  02/22/2012   Procedure: COLONOSCOPY;  Surgeon: Rogene Houston, MD;  Location[facility];  Service: Endoscopy;  Laterality: N/A;  100  . KNEE SURGERY     bilateral  . PACEMAKER IMPLANT N/A 07/17/2017   Procedure: Pacemaker Implant;  Surgeon: Deboraha Sprang, MD;  Location: [facility];  Service: Cardiovascular;  Laterality: N/A;    Allergies  Allergen Reactions  . Sulfur Swelling and Rash    Outpatient Encounter Medications as of 02/05/2018  Medication Sig  . acetaminophen (TYLENOL) 500 MG tablet Take 1,000 mg by mouth 2 (two) times daily. Max of 3 grams acetaminophen per 24 hrs from all sources.  Marland Kitchen amLODipine (NORVASC) 2.5 MG tablet Take 2.5 mg by mouth daily.  Jearl Klinefelter ELLIPTA 62.5-25 MCG/INH AEPB Inhale 1 Inhaler into the lungs daily.   Marland Kitchen apixaban (ELIQUIS) 2.5 MG TABS tablet Take 1 tablet (2.5 mg total) by mouth 2 (two) times daily. Resume 07/20/17  . atorvastatin (LIPITOR) 80 MG tablet Take 1 tablet (80 mg total) by mouth daily at 6 PM.  . cycloSPORINE (RESTASIS) 0.05 % ophthalmic emulsion Place 1 drop into both eyes every 12 (twelve) hours.   . diclofenac sodium (VOLTAREN) 1 % GEL Apply 1 application topically 4 (four) times daily as needed.  . diltiazem (CARDIZEM CD) 180 MG 24 hr capsule TAKE ONE CAPSULE BY MOUTH ONCE DAILY.  . furosemide (LASIX) 40 MG tablet Take 40 mg by mouth 2 (two) times daily.  Marland Kitchen gabapentin (NEURONTIN) 100 MG capsule Take 1 capsule (100 mg total) by mouth 3 (three) times daily.  . hydrocortisone (ANUSOL-HC) 2.5 % rectal cream Place 1 application rectally 2 (two) times daily as needed for hemorrhoids or anal itching.  . Melatonin 5 MG TABS Take 5 mg by mouth at bedtime as needed.  . Multiple Vitamin  (MULITIVITAMIN WITH MINERALS) TABS Take 1 tablet by mouth every other day.   . OXYGEN Inhale 2 L into the lungs 2 (two) times daily. 3:15pm to 11:15pm 11:15pm to 07:15am  . pantoprazole (PROTONIX) 40 MG tablet Take 40 mg by mouth daily.  Vladimir Faster Glycol-Propyl Glycol (SYSTANE OP) Place 1 drop into both eyes 3 (three) times daily. For dry eyes  . polyethylene glycol (MIRALAX / GLYCOLAX) packet Take 17 g by mouth daily.  . potassium chloride (K-DUR,KLOR-CON) 10 MEQ tablet Take 20 mEq by mouth daily.  Marland Kitchen PROAIR HFA 108 (90 BASE) MCG/ACT inhaler Inhale 2 puffs into the lungs 3 (three) times daily as needed for wheezing (cough).   . traMADol (ULTRAM) 50 MG tablet Take 50 mg by mouth 3 (three) times daily.  . [DISCONTINUED] Albuterol (VENTOLIN IN) Inhale into the lungs.    . [DISCONTINUED] traMADol (ULTRAM) 50 MG tablet Take 1 tablet (50  mg total) by mouth 2 (two) times daily. (Patient taking differently: Take 50 mg by mouth 3 (three) times daily. )   No facility-administered encounter medications on file as of 02/05/2018.      Review of Systems   In general she is not complaining of any fever chills or weight appears to have stabilized at about 140 pounds-  Skin she is not complaining of diaphoresis rashes or itching.  Head ears eyes nose mouth and throat does not complain of visual changes or sore throat she does have prescription lenses  Respiratory does not complain of shortness of breath or cough.  Cardiac denies chest pain has chronic lower extremity edema appears a bit more on the left versus the right.  GI is not complaining of abdominal discomfort nausea vomiting diarrhea or constipation she does have a history of hemorrhoids at times was bleeding but this has been stable for some time she is not thought to be a good candidate for any banding  GU does not complain of dysuria currently.  Musculoskeletal at times will complain of knee and shoulder pain as noted above apparently the  tramadol is helping alternating with the Tylenol and also topical treatment with Voltaren gel.  Neurologic does not complain of dizziness headache or numbness.--Continues with expressive aphasia status post CVA  And psych does not complain of overt anxiety or depression appears to be in good spirits   Immunization History  Administered Date(s) Administered  . Influenza-Unspecified 10/10/2013, 09/11/2017  . Pneumococcal Conjugate-13 09/13/2017  . Tdap 09/16/2017  . Zoster 12/05/2017   Pertinent  Health Maintenance Due  Topic Date Due  . DEXA SCAN  01/15/1986  . PNA vac Low Risk Adult (2 of 2 - PPSV23) 09/13/2018  . INFLUENZA VACCINE  Completed   Fall Risk  09/02/2017  Falls in the past year? Yes  Number falls in past yr: 1  Injury with Fall? No   Functional Status Survey:    Vitals:   02/05/18 1104  BP: 120/63  Pulse: 60  Resp: 16  Temp: (!) 97 F (36.1 C)  TempSrc: Oral  SpO2: 97%  Weight: 141 lb 12.8 oz (64.3 kg)  Height: 5\' 4"  (1.626 m)   Body mass index is 24.34 kg/m. Physical Exam   In general this is a pleasant elderly female in no distress sitting comfortably in her wheelchair.  Her skin is warm and dry.  Eyes she has prescription lenses visual acuity appears grossly intact sclera and conjunctive are clear.  Her oropharynx is clear mucous membranes moist.  Is chest is clear to auscultation there is no labored breathing.  Heart is regular irregular rate and rhythm without murmur gallop or rub she has baseline lower extremity edema appears a bit more on the left versus the right pedal pulses are intact.  Her abdomen is soft nontender with positive bowel sounds.  Musculoskeletal is able to move all her extremities x4 with limited range of motion of her knees which is not new she is actually able to lift her arms bilaterally I do not know really any deformity of her shoulders bilaterally.  Neurologic continues with expressive aphasia and delayed speech  which is not new she is alert moves her extremities at baseline could not really appreciate lateralizing findings.  Psych she appears to be alert and oriented pleasant and appropriate     Labs reviewed: Recent Labs    07/15/17 0805  12/16/17 0630 12/31/17 0630 01/06/18 0800  NA 134*   < >  141 140 140  K 5.5*   < > 3.5 3.7 4.1  CL 103   < > 103 104 103  CO2 19*   < > 29 25 27   GLUCOSE 94   < > 92 93 85  BUN 53*   < > 22* 27* 25*  CREATININE 1.52*   < > 0.95 0.86 0.91  CALCIUM 8.7*   < > 9.0 8.7* 8.7*  MG 2.3  --   --   --   --    < > = values in this interval not displayed.   Recent Labs    09/24/17 0700 10/16/17 1608 10/21/17 0300 12/28/17 1040  AST 22 20 18 27   ALT 15 15 13* 16  ALKPHOS 134*  --  115 132*  BILITOT 0.8 0.6 0.6 1.0  PROT 6.7 6.6 6.1* 7.1  ALBUMIN 3.7  --  3.3* 3.7   Recent Labs    08/20/17 0706 09/20/17 1930 12/06/17 0714 12/07/17 0930  WBC 7.3 7.8 8.7 5.7  NEUTROABS 4.2  --  5.7 3.1  HGB 12.9 13.1 12.2 12.0  HCT 40.5 42.0 40.1 38.6  MCV 85.8 86.2 88.9 88.7  PLT 226 249 205 180   Lab Results  Component Value Date   TSH 1.446 07/15/2017   Lab Results  Component Value Date   HGBA1C 5.5 12/06/2016   Lab Results  Component Value Date   CHOL 113 12/07/2017   HDL 56 12/07/2017   LDLCALC 48 12/07/2017   TRIG 47 12/07/2017   CHOLHDL 2.0 12/07/2017    Significant Diagnostic Results in last 30 days:  No results found.  Assessment/Plan  #1-: History of diastolic CHF-at one point she had weight gain but this appears moderated since Lasix was increased-edema appears relatively baseline appears to have some increased edema on the left versus the right.  At this point will update a metabolic panel to ensure stability of renal function electrolytes.  Also with regards to the left leg edema will order a venous Doppler I do see Dopplers were done in July of last year bilaterally which did not show any acute process.  She is again on Eliquis  as well low-dose with history of A. fib.  2.  History of atrial fibrillation she is rate controlled on diltiazem is on low-dose Eliquis for anticoagulation.  3.  History of hypertension has had some variable blood pressures in the past but this appears stable today at 120/63 she is on low-dose Norvasc as well as that diltiazem at this point will continue to monitor.  4.  History of complete heart block she is status post pacemaker this appears to be stable and this is followed by cardiology.  5.  History of CVA with expressive aphasia appears to be baseline on Eliquis she is also on a statin LDL was satisfactory at 48 on lab done in December 2018.  She is not on aspirin because of concerns of GI bleed in the past.  6.  History of hemorrhoid with bleeding again this is been stable for some time not thought to be a good candidate for bandingBecause of her age and being on Eliquis  #7 history of osteoarthritic pain diffuse-at this point this appears relatively well controlled on current dosing of tramadol as well as Tylenol she is also on Neurontin 3 times a day.  At this point will monitor again concerns about oversedation with increasing meds significantly.  She does receive the Voltaren gel as well as needed and  therapy helps with this.  8.  History of dilated pancreatic duct with elevated alkaline phosphatase in the past this appears to be a minimal elevation at this point with lab done in mid January she does not appear to be symptomatic-this has been assessed by GI and thought to be stable.  9.  History of bilateral carotid artery stenosis again continues on Eliquis family has not desired any aggressive workup of this.  10.  History of COPD she continues to be stable in this regard she is on inhalers including Anora Ellipta as well as Therapist, sports.  11.  History of questionable GERD-in the past she is complaining of some abdominal discomfort but this appears to have improved on  Protonix.  12.  History of insomnia this appears stable on melatonin.  Again will update a CBC and metabolic panel-also will order a venous Doppler of the left leg to rule out DVT again the previous Doppler was negative in July.  [id number]- of note greater than 40 minutes spent assessing patient-reviewing her chart and labs discussing her status with nursing staff and coordinating and formulating a plan of care for numerous diagnoses-of note greater than 50% of time spent coordinating plan of care

## 2018-02-06 ENCOUNTER — Encounter (HOSPITAL_COMMUNITY)
Admission: RE | Admit: 2018-02-06 | Discharge: 2018-02-06 | Disposition: A | Discharge status: Home / Self Care | Payer: Medicare Other | Source: Skilled Nursing Facility | Attending: Internal Medicine | Admitting: Internal Medicine

## 2018-02-06 DIAGNOSIS — M6281 Muscle weakness (generalized): Secondary | ICD-10-CM | POA: Diagnosis not present

## 2018-02-06 DIAGNOSIS — I63312 Cerebral infarction due to thrombosis of left middle cerebral artery: Secondary | ICD-10-CM | POA: Diagnosis not present

## 2018-02-06 DIAGNOSIS — I442 Atrioventricular block, complete: Secondary | ICD-10-CM | POA: Insufficient documentation

## 2018-02-06 DIAGNOSIS — I5032 Chronic diastolic (congestive) heart failure: Secondary | ICD-10-CM | POA: Insufficient documentation

## 2018-02-06 DIAGNOSIS — R279 Unspecified lack of coordination: Secondary | ICD-10-CM | POA: Diagnosis not present

## 2018-02-06 DIAGNOSIS — M25512 Pain in left shoulder: Secondary | ICD-10-CM | POA: Insufficient documentation

## 2018-02-06 DIAGNOSIS — R600 Localized edema: Secondary | ICD-10-CM | POA: Diagnosis not present

## 2018-02-06 DIAGNOSIS — M1990 Unspecified osteoarthritis, unspecified site: Secondary | ICD-10-CM | POA: Diagnosis not present

## 2018-02-06 LAB — CBC WITH DIFFERENTIAL/PLATELET
BASOS ABS: 0 10*3/uL (ref 0.0–0.1)
BASOS PCT: 0 %
EOS ABS: 0.2 10*3/uL (ref 0.0–0.7)
Eosinophils Relative: 3 %
HEMATOCRIT: 43.4 % (ref 36.0–46.0)
HEMOGLOBIN: 13.6 g/dL (ref 12.0–15.0)
Lymphocytes Relative: 24 %
Lymphs Abs: 1.6 10*3/uL (ref 0.7–4.0)
MCH: 27.4 pg (ref 26.0–34.0)
MCHC: 31.3 g/dL (ref 30.0–36.0)
MCV: 87.3 fL (ref 78.0–100.0)
MONOS PCT: 16 %
Monocytes Absolute: 1 10*3/uL (ref 0.1–1.0)
Neutro Abs: 3.7 10*3/uL (ref 1.7–7.7)
Neutrophils Relative %: 57 %
Platelets: 219 10*3/uL (ref 150–400)
RBC: 4.97 MIL/uL (ref 3.87–5.11)
RDW: 14.9 % (ref 11.5–15.5)
WBC: 6.6 10*3/uL (ref 4.0–10.5)

## 2018-02-06 LAB — BASIC METABOLIC PANEL
ANION GAP: 12 (ref 5–15)
BUN: 30 mg/dL — ABNORMAL HIGH (ref 6–20)
CALCIUM: 9 mg/dL (ref 8.9–10.3)
CO2: 26 mmol/L (ref 22–32)
CREATININE: 1 mg/dL (ref 0.44–1.00)
Chloride: 101 mmol/L (ref 101–111)
GFR calc Af Amer: 53 mL/min — ABNORMAL LOW (ref >60)
GFR calc non Af Amer: 46 mL/min — ABNORMAL LOW (ref >60)
Glucose, Bld: 93 mg/dL (ref 65–99)
Potassium: 3.9 mmol/L (ref 3.5–5.1)
SODIUM: 139 mmol/L (ref 135–145)

## 2018-02-07 DIAGNOSIS — I63312 Cerebral infarction due to thrombosis of left middle cerebral artery: Secondary | ICD-10-CM | POA: Diagnosis not present

## 2018-02-07 DIAGNOSIS — M1990 Unspecified osteoarthritis, unspecified site: Secondary | ICD-10-CM | POA: Diagnosis not present

## 2018-02-07 DIAGNOSIS — R279 Unspecified lack of coordination: Secondary | ICD-10-CM | POA: Diagnosis not present

## 2018-02-07 DIAGNOSIS — M25512 Pain in left shoulder: Secondary | ICD-10-CM | POA: Diagnosis not present

## 2018-02-07 DIAGNOSIS — M6281 Muscle weakness (generalized): Secondary | ICD-10-CM | POA: Diagnosis not present

## 2018-02-10 DIAGNOSIS — M6281 Muscle weakness (generalized): Secondary | ICD-10-CM | POA: Diagnosis not present

## 2018-02-10 DIAGNOSIS — M25512 Pain in left shoulder: Secondary | ICD-10-CM | POA: Diagnosis not present

## 2018-02-10 DIAGNOSIS — I63312 Cerebral infarction due to thrombosis of left middle cerebral artery: Secondary | ICD-10-CM | POA: Diagnosis not present

## 2018-02-10 DIAGNOSIS — M1990 Unspecified osteoarthritis, unspecified site: Secondary | ICD-10-CM | POA: Diagnosis not present

## 2018-02-10 DIAGNOSIS — R279 Unspecified lack of coordination: Secondary | ICD-10-CM | POA: Diagnosis not present

## 2018-02-12 DIAGNOSIS — M6281 Muscle weakness (generalized): Secondary | ICD-10-CM | POA: Diagnosis not present

## 2018-02-12 DIAGNOSIS — R279 Unspecified lack of coordination: Secondary | ICD-10-CM | POA: Diagnosis not present

## 2018-02-12 DIAGNOSIS — I63312 Cerebral infarction due to thrombosis of left middle cerebral artery: Secondary | ICD-10-CM | POA: Diagnosis not present

## 2018-02-12 DIAGNOSIS — M25512 Pain in left shoulder: Secondary | ICD-10-CM | POA: Diagnosis not present

## 2018-02-12 DIAGNOSIS — M1990 Unspecified osteoarthritis, unspecified site: Secondary | ICD-10-CM | POA: Diagnosis not present

## 2018-02-14 ENCOUNTER — Other Ambulatory Visit: Payer: Self-pay

## 2018-02-14 DIAGNOSIS — M25512 Pain in left shoulder: Secondary | ICD-10-CM | POA: Diagnosis not present

## 2018-02-14 DIAGNOSIS — M1990 Unspecified osteoarthritis, unspecified site: Secondary | ICD-10-CM | POA: Diagnosis not present

## 2018-02-14 DIAGNOSIS — I63312 Cerebral infarction due to thrombosis of left middle cerebral artery: Secondary | ICD-10-CM | POA: Diagnosis not present

## 2018-02-14 DIAGNOSIS — M6281 Muscle weakness (generalized): Secondary | ICD-10-CM | POA: Diagnosis not present

## 2018-02-14 DIAGNOSIS — R279 Unspecified lack of coordination: Secondary | ICD-10-CM | POA: Diagnosis not present

## 2018-02-14 MED ORDER — TRAMADOL HCL 50 MG PO TABS: 50.0000 mg | ORAL_TABLET | Freq: Three times a day (TID) | ORAL | 0 refills | Status: DC

## 2018-02-14 NOTE — Telephone Encounter (Signed)
RX Fax for [facility]@ [phone]  

## 2018-02-17 DIAGNOSIS — M25512 Pain in left shoulder: Secondary | ICD-10-CM | POA: Diagnosis not present

## 2018-02-17 DIAGNOSIS — I63312 Cerebral infarction due to thrombosis of left middle cerebral artery: Secondary | ICD-10-CM | POA: Diagnosis not present

## 2018-02-17 DIAGNOSIS — M1990 Unspecified osteoarthritis, unspecified site: Secondary | ICD-10-CM | POA: Diagnosis not present

## 2018-02-17 DIAGNOSIS — R279 Unspecified lack of coordination: Secondary | ICD-10-CM | POA: Diagnosis not present

## 2018-02-17 DIAGNOSIS — M6281 Muscle weakness (generalized): Secondary | ICD-10-CM | POA: Diagnosis not present

## 2018-02-18 DIAGNOSIS — M6281 Muscle weakness (generalized): Secondary | ICD-10-CM | POA: Diagnosis not present

## 2018-02-18 DIAGNOSIS — M25512 Pain in left shoulder: Secondary | ICD-10-CM | POA: Diagnosis not present

## 2018-02-18 DIAGNOSIS — R279 Unspecified lack of coordination: Secondary | ICD-10-CM | POA: Diagnosis not present

## 2018-02-18 DIAGNOSIS — I63312 Cerebral infarction due to thrombosis of left middle cerebral artery: Secondary | ICD-10-CM | POA: Diagnosis not present

## 2018-02-18 DIAGNOSIS — M1990 Unspecified osteoarthritis, unspecified site: Secondary | ICD-10-CM | POA: Diagnosis not present

## 2018-02-19 DIAGNOSIS — M6281 Muscle weakness (generalized): Secondary | ICD-10-CM | POA: Diagnosis not present

## 2018-02-19 DIAGNOSIS — M25512 Pain in left shoulder: Secondary | ICD-10-CM | POA: Diagnosis not present

## 2018-02-19 DIAGNOSIS — R279 Unspecified lack of coordination: Secondary | ICD-10-CM | POA: Diagnosis not present

## 2018-02-19 DIAGNOSIS — M1990 Unspecified osteoarthritis, unspecified site: Secondary | ICD-10-CM | POA: Diagnosis not present

## 2018-02-19 DIAGNOSIS — I63312 Cerebral infarction due to thrombosis of left middle cerebral artery: Secondary | ICD-10-CM | POA: Diagnosis not present

## 2018-02-20 DIAGNOSIS — M1990 Unspecified osteoarthritis, unspecified site: Secondary | ICD-10-CM | POA: Diagnosis not present

## 2018-02-20 DIAGNOSIS — I63312 Cerebral infarction due to thrombosis of left middle cerebral artery: Secondary | ICD-10-CM | POA: Diagnosis not present

## 2018-02-20 DIAGNOSIS — M25512 Pain in left shoulder: Secondary | ICD-10-CM | POA: Diagnosis not present

## 2018-02-20 DIAGNOSIS — M6281 Muscle weakness (generalized): Secondary | ICD-10-CM | POA: Diagnosis not present

## 2018-02-20 DIAGNOSIS — R279 Unspecified lack of coordination: Secondary | ICD-10-CM | POA: Diagnosis not present

## 2018-02-21 DIAGNOSIS — M1990 Unspecified osteoarthritis, unspecified site: Secondary | ICD-10-CM | POA: Diagnosis not present

## 2018-02-21 DIAGNOSIS — M6281 Muscle weakness (generalized): Secondary | ICD-10-CM | POA: Diagnosis not present

## 2018-02-21 DIAGNOSIS — M25512 Pain in left shoulder: Secondary | ICD-10-CM | POA: Diagnosis not present

## 2018-02-21 DIAGNOSIS — I63312 Cerebral infarction due to thrombosis of left middle cerebral artery: Secondary | ICD-10-CM | POA: Diagnosis not present

## 2018-02-21 DIAGNOSIS — R279 Unspecified lack of coordination: Secondary | ICD-10-CM | POA: Diagnosis not present

## 2018-02-25 DIAGNOSIS — M25512 Pain in left shoulder: Secondary | ICD-10-CM | POA: Diagnosis not present

## 2018-02-25 DIAGNOSIS — M6281 Muscle weakness (generalized): Secondary | ICD-10-CM | POA: Diagnosis not present

## 2018-02-25 DIAGNOSIS — I63312 Cerebral infarction due to thrombosis of left middle cerebral artery: Secondary | ICD-10-CM | POA: Diagnosis not present

## 2018-02-25 DIAGNOSIS — M1990 Unspecified osteoarthritis, unspecified site: Secondary | ICD-10-CM | POA: Diagnosis not present

## 2018-02-25 DIAGNOSIS — R279 Unspecified lack of coordination: Secondary | ICD-10-CM | POA: Diagnosis not present

## 2018-03-12 ENCOUNTER — Other Ambulatory Visit: Payer: Self-pay

## 2018-03-12 MED ORDER — TRAMADOL HCL 50 MG PO TABS: 50.0000 mg | ORAL_TABLET | Freq: Three times a day (TID) | ORAL | 0 refills | Status: DC

## 2018-03-12 NOTE — Telephone Encounter (Signed)
RX Fax for [facility]@ [phone]  

## 2018-03-21 DIAGNOSIS — M546 Pain in thoracic spine: Secondary | ICD-10-CM | POA: Diagnosis not present

## 2018-03-21 DIAGNOSIS — M9902 Segmental and somatic dysfunction of thoracic region: Secondary | ICD-10-CM | POA: Diagnosis not present

## 2018-03-21 DIAGNOSIS — M542 Cervicalgia: Secondary | ICD-10-CM | POA: Diagnosis not present

## 2018-03-21 DIAGNOSIS — M9901 Segmental and somatic dysfunction of cervical region: Secondary | ICD-10-CM | POA: Diagnosis not present

## 2018-03-31 DIAGNOSIS — M9902 Segmental and somatic dysfunction of thoracic region: Secondary | ICD-10-CM | POA: Diagnosis not present

## 2018-03-31 DIAGNOSIS — M546 Pain in thoracic spine: Secondary | ICD-10-CM | POA: Diagnosis not present

## 2018-03-31 DIAGNOSIS — M542 Cervicalgia: Secondary | ICD-10-CM | POA: Diagnosis not present

## 2018-03-31 DIAGNOSIS — M9901 Segmental and somatic dysfunction of cervical region: Secondary | ICD-10-CM | POA: Diagnosis not present

## 2018-04-01 ENCOUNTER — Encounter: Payer: Self-pay | Admitting: Internal Medicine

## 2018-04-01 ENCOUNTER — Non-Acute Institutional Stay (SKILLED_NURSING_FACILITY): Payer: Medicare Other | Admitting: Internal Medicine

## 2018-04-01 DIAGNOSIS — Z950 Presence of cardiac pacemaker: Secondary | ICD-10-CM | POA: Insufficient documentation

## 2018-04-01 DIAGNOSIS — I639 Cerebral infarction, unspecified: Secondary | ICD-10-CM | POA: Diagnosis not present

## 2018-04-01 DIAGNOSIS — I482 Chronic atrial fibrillation, unspecified: Secondary | ICD-10-CM

## 2018-04-01 DIAGNOSIS — R600 Localized edema: Secondary | ICD-10-CM

## 2018-04-01 DIAGNOSIS — I5032 Chronic diastolic (congestive) heart failure: Secondary | ICD-10-CM

## 2018-04-01 DIAGNOSIS — H348112 Central retinal vein occlusion, right eye, stable: Secondary | ICD-10-CM

## 2018-04-01 DIAGNOSIS — I10 Essential (primary) hypertension: Secondary | ICD-10-CM

## 2018-04-01 NOTE — Progress Notes (Signed)
Location:   [facility] Room Number: 156/W Place of Service:  SNF [phone]) Provider:  Clydene Fake, MD  Patient Care Team: Virgie Dad, MD as PCP - General (Internal Medicine) Rolm Baptise as Physician Assistant (Internal Medicine)  Extended Emergency Contact Information Primary Emergency Contact: Adin Hector, [place] [address] [name] of [place] Phone: [phone] Relation: Son Secondary Emergency Contact: [name], [place] [address] [name] of [place] Phone: [phone] Mobile Phone: [phone] Relation: Son  Code Status:  DNR Goals of care: Advanced Directive information Advanced Directives 04/01/2018  Does Patient Have a Medical Advance Directive? Yes  Type of Advance Directive Out of facility DNR (pink MOST or yellow form)  Does patient want to make changes to medical advance directive? No - Patient declined  Copy of [place] in Chart? No - copy requested  Would patient like information on creating a medical advance directive? No - Patient declined  Pre-existing out of facility DNR order (yellow form or pink MOST form) -     Chief Complaint  Patient presents with  . Medical Management of Chronic Issues    Routine Visit    HPI:  Pt is a 84 y.o. female seen today for medical management of chronic diseases.   Patient has h/o Atrial fibrillation on Eliquis, S/P Pacemaker for sick sinus, S/P Acute CVA involving left MCA , ICA stenosis,Diastolic CHF,Dilated Pancreatic Duct, Hemorrhoids, hyperlipidemia, And Hypertension And Arthritis with Chronic Knee and Back Pain.right retinal vein occlusion. Patient was seen today for a routine visit.  She did not have any complaints of chest pain shortness of breath or cough. She continues to have lower extremity edema. Her appetite is good and her weight is not up to 145.8 lbs from 135 lbs in past few months. She  continues to have multiple joint pains which are controlled with Ultram and Voltaren gel. No new nursing issues  Past Medical History:  Diagnosis Date  . Atrial fibrillation ([place])   . Bradycardia   . Breast nodule 11/15/2014  . Breast pain, left 11/15/2014  . Carotid artery disease ([place])   . Dizziness   . Dyspnea    Previous CPX suggesting possible restrictive physiology, respiratory muscle fatigue, diastolic dysfunction  . Essential hypertension, benign   . Hyperlipidemia   . Oxygen dependent    2 liter  . Pacemaker [place]   . Rib pain on left side 11/15/2014  . Seasonal allergies   . Shingles 05/04/2015  . Stroke ([place])   . Toe fracture, left    left big toe   Past Surgical History:  Procedure Laterality Date  . COLONOSCOPY  02/22/2012   Procedure: COLONOSCOPY;  Surgeon: Rogene Houston, MD;  [facility];  Service: Endoscopy;  Laterality: N/A;  100  . KNEE SURGERY     bilateral  . PACEMAKER IMPLANT N/A 07/17/2017   Procedure: Pacemaker Implant;  Surgeon: Deboraha Sprang, MD;  Location: [facility];  Service: Cardiovascular;  Laterality: N/A;    Allergies  Allergen Reactions  . Sulfur Swelling and Rash    Outpatient Encounter Medications as of 04/01/2018  Medication Sig  . acetaminophen (TYLENOL) 500 MG tablet Take 1,000 mg by mouth 2 (two) times daily. Max of 3 grams acetaminophen per 24 hrs from all sources.  Marland Kitchen amLODipine (NORVASC) 2.5 MG  tablet Take 2.5 mg by mouth daily.  Jearl Klinefelter ELLIPTA 62.5-25 MCG/INH AEPB Inhale 1 Inhaler into the lungs daily.   Marland Kitchen apixaban (ELIQUIS) 2.5 MG TABS tablet Take 1 tablet (2.5 mg total) by mouth 2 (two) times daily. Resume 07/20/17  . atorvastatin (LIPITOR) 80 MG tablet Take 1 tablet (80 mg total) by mouth daily at 6 PM.  . cycloSPORINE (RESTASIS) 0.05 % ophthalmic emulsion Place 1 drop into both eyes every 12 (twelve) hours.   . diclofenac sodium (VOLTAREN) 1 % GEL Apply 1 application topically 4 (four) times daily as  needed. Add to body where needed  . diltiazem (CARDIZEM CD) 180 MG 24 hr capsule TAKE ONE CAPSULE BY MOUTH ONCE DAILY.  . furosemide (LASIX) 40 MG tablet Take 40 mg by mouth 2 (two) times daily.  Marland Kitchen gabapentin (NEURONTIN) 100 MG capsule Take 1 capsule (100 mg total) by mouth 3 (three) times daily.  . hydrocortisone (ANUSOL-HC) 2.5 % rectal cream Place 1 application rectally 2 (two) times daily as needed for hemorrhoids or anal itching.  . Melatonin 5 MG TABS Take 5 mg by mouth at bedtime as needed.  . Multiple Vitamin (MULITIVITAMIN WITH MINERALS) TABS Take 1 tablet by mouth every other day.   . OXYGEN Inhale 2 L into the lungs 2 (two) times daily. 3:15pm to 11:15pm 11:15pm to 07:15am  . pantoprazole (PROTONIX) 40 MG tablet Take 40 mg by mouth daily.  Vladimir Faster Glycol-Propyl Glycol (SYSTANE OP) Place 1 drop into both eyes 3 (three) times daily. For dry eyes  . polyethylene glycol (MIRALAX / GLYCOLAX) packet Take 17 g by mouth daily.  . potassium chloride (K-DUR,KLOR-CON) 10 MEQ tablet Take 20 mEq by mouth daily.  Marland Kitchen PROAIR HFA 108 (90 BASE) MCG/ACT inhaler Inhale 2 puffs into the lungs 3 (three) times daily as needed for wheezing (cough).   . traMADol (ULTRAM) 50 MG tablet Take 1 tablet (50 mg total) by mouth 3 (three) times daily.  . [DISCONTINUED] Albuterol (VENTOLIN IN) Inhale into the lungs.     No facility-administered encounter medications on file as of 04/01/2018.      Review of Systems  Review of Systems  Constitutional: Negative for activity change, appetite change, chills, diaphoresis, fatigue and fever.  HENT: Negative for mouth sores, postnasal drip, rhinorrhea, sinus pain and sore throat.   Respiratory: Negative for apnea, cough, chest tightness, shortness of breath and wheezing.   Cardiovascular: Negative for chest pain, palpitations and leg swelling.  Gastrointestinal: Negative for abdominal distention, abdominal pain, constipation, diarrhea, nausea and vomiting.    Genitourinary: Negative for dysuria and frequency.  Musculoskeletal: Positive  for arthralgias, joint swelling and myalgias.  Skin: Negative for rash.  Neurological: Negative for dizziness, syncope, weakness, light-headedness and numbness.  Psychiatric/Behavioral: Negative for behavioral problems, confusion and sleep disturbance.     Immunization History  Administered Date(s) Administered  . Influenza-Unspecified 10/10/2013, 09/11/2017  . Pneumococcal Conjugate-13 09/13/2017  . Tdap 09/16/2017  . Zoster 12/05/2017   Pertinent  Health Maintenance Due  Topic Date Due  . DEXA SCAN  05/01/2018 (Originally 01/15/1986)  . INFLUENZA VACCINE  07/10/2018  . PNA vac Low Risk Adult (2 of 2 - PPSV23) 09/13/2018   Fall Risk  09/02/2017  Falls in the past year? Yes  Number falls in past yr: 1  Injury with Fall? No   Functional Status Survey:    Vitals:   04/01/18 1047  BP: (!) 143/61  Pulse: 60  Resp: 18  Temp: (!) 97.5 F (  36.4 C)  TempSrc: Oral  SpO2: 98%  Weight: 145 lb 12.8 oz (66.1 kg)  Height: _0  (1.626 m)   Body mass index is 25.03 kg/m. Physical Exam  Constitutional: She appears well-developed and well-nourished.  HENT:  Head: Normocephalic.  Mouth/Throat: Oropharynx is clear and moist.  Eyes: Pupils are equal, round, and reactive to light.  Neck: Neck supple.  Cardiovascular: Normal rate and regular rhythm.  Murmur heard. Pulmonary/Chest: Effort normal and breath sounds normal. No stridor. No respiratory distress. She has no wheezes.  Abdominal: Soft. Bowel sounds are normal. She exhibits no distension. There is no tenderness. There is no guarding.  Musculoskeletal:  Bilateral moderate edema  Lymphadenopathy:    She has no cervical adenopathy.  Neurological: She is alert.  Skin: Skin is warm and dry.  Psychiatric: She has a normal mood and affect. Her behavior is normal. Thought content normal.    Labs reviewed: Recent Labs    07/15/17 0805   12/31/17 0630 01/06/18 0800 02/06/18 0630  NA 134*   < > 140 140 139  K 5.5*   < > 3.7 4.1 3.9  CL 103   < > 104 103 101  CO2 19*   < > _1 GLUCOSE 94   < > 93 85 93  BUN 53*   < > 27* 25* 30*  CREATININE 1.52*   < > 0.86 0.91 1.00  CALCIUM 8.7*   < > 8.7* 8.7* 9.0  MG 2.3  --   --   --   --    < > = values in this interval not displayed.   Recent Labs    09/24/17 0700 10/16/17 1608 10/21/17 0300 12/28/17 1040  AST _2 ALT 15 15 13* 16  ALKPHOS 134*  --  115 132*  BILITOT 0.8 0.6 0.6 1.0  PROT 6.7 6.6 6.1* 7.1  ALBUMIN 3.7  --  3.3* 3.7   Recent Labs    12/06/17 0714 12/07/17 0930 02/06/18 0630  WBC 8.7 5.7 6.6  NEUTROABS 5.7 3.1 3.7  HGB 12.2 12.0 13.6  HCT 40.1 38.6 43.4  MCV 88.9 88.7 87.3  PLT 205 180 219   Lab Results  Component Value Date   TSH 1.446 07/15/2017   Lab Results  Component Value Date   HGBA1C 5.5 12/06/2016   Lab Results  Component Value Date   CHOL 113 12/07/2017   HDL 56 12/07/2017   LDLCALC 48 12/07/2017   TRIG 47 12/07/2017   CHOLHDL 2.0 12/07/2017    Significant Diagnostic Results in last 30 days:  No results found.  Assessment/Plan Essential hypertension Blood pressure is better controlled on a low-dose of Norvasc She is also on Lasix  Chronic diastolic heart failure  On Lasix twice daily Renal function stable We will repeat BMP We will continue the same dose  S/P Pacemaker insertion Patient has done well in facility. She is stable  Follows with cardiology  Chronic atrial fibrillation  Rate is controlled on Diltiazem. Continue on lower dose of Eliquis as she has history of GI bleed  Internal carotid artery stenosis, bilateral Patient on Eliquis. Son has decided to not be aggressive.  S/P Cerebrovascular accident with mild Dysphasia Doing well on Eliquis. Aspirin was held due to her GI bleed. No change required. She is on high dose of Statins Dilated pancreatic duct with elevated alk  phos Patient follows with GI Ultrasound was negative.  At this time no aggressive workup  needed Hemorrhoids Patient has not had any more episodes of rectal bleed.or abdominal Pain Her Hgb has been stable . Repeat CBC  GI had decided not to do any banding procedure due to her age and Eliquis. Insomnia Melatonin as needed. Back and Joint pain Controlled on Ultram. She is also on Neurontin.   And on Voltaren gel     Family/ staff Communication:   Labs/tests ordered:  BMP and CBC  Total time spent in this patient care encounter was 25_ minutes; greater than 50% of the visit spent counseling patient, reviewing records , Labs and coordinating care for problems addressed at this encounter.

## 2018-04-02 ENCOUNTER — Encounter (HOSPITAL_COMMUNITY)
Admission: RE | Admit: 2018-04-02 | Discharge: 2018-04-02 | Disposition: A | Discharge status: Home / Self Care | Payer: Medicare Other | Source: Skilled Nursing Facility | Attending: Internal Medicine | Admitting: Internal Medicine

## 2018-04-02 DIAGNOSIS — I5032 Chronic diastolic (congestive) heart failure: Secondary | ICD-10-CM | POA: Insufficient documentation

## 2018-04-02 DIAGNOSIS — M25512 Pain in left shoulder: Secondary | ICD-10-CM | POA: Diagnosis not present

## 2018-04-02 DIAGNOSIS — I442 Atrioventricular block, complete: Secondary | ICD-10-CM | POA: Diagnosis not present

## 2018-04-02 DIAGNOSIS — R279 Unspecified lack of coordination: Secondary | ICD-10-CM | POA: Diagnosis not present

## 2018-04-02 DIAGNOSIS — I63312 Cerebral infarction due to thrombosis of left middle cerebral artery: Secondary | ICD-10-CM | POA: Diagnosis not present

## 2018-04-02 DIAGNOSIS — M6281 Muscle weakness (generalized): Secondary | ICD-10-CM | POA: Insufficient documentation

## 2018-04-02 LAB — HEPATIC FUNCTION PANEL
ALT: 17 U/L (ref 14–54)
AST: 25 U/L (ref 15–41)
Albumin: 3.8 g/dL (ref 3.5–5.0)
Alkaline Phosphatase: 144 U/L — ABNORMAL HIGH (ref 38–126)
Bilirubin, Direct: 0.2 mg/dL (ref 0.1–0.5)
Indirect Bilirubin: 1 mg/dL — ABNORMAL HIGH (ref 0.3–0.9)
TOTAL PROTEIN: 7.2 g/dL (ref 6.5–8.1)
Total Bilirubin: 1.2 mg/dL (ref 0.3–1.2)

## 2018-04-02 LAB — BASIC METABOLIC PANEL
Anion gap: 13 (ref 5–15)
BUN: 29 mg/dL — AB (ref 6–20)
CO2: 26 mmol/L (ref 22–32)
Calcium: 9.1 mg/dL (ref 8.9–10.3)
Chloride: 101 mmol/L (ref 101–111)
Creatinine, Ser: 1.15 mg/dL — ABNORMAL HIGH (ref 0.44–1.00)
GFR, EST AFRICAN AMERICAN: 45 mL/min — AB (ref >60)
GFR, EST NON AFRICAN AMERICAN: 39 mL/min — AB (ref >60)
Glucose, Bld: 125 mg/dL — ABNORMAL HIGH (ref 65–99)
POTASSIUM: 4 mmol/L (ref 3.5–5.1)
SODIUM: 140 mmol/L (ref 135–145)

## 2018-04-02 LAB — CBC WITH DIFFERENTIAL/PLATELET
BASOS ABS: 0 10*3/uL (ref 0.0–0.1)
Basophils Relative: 0 %
EOS ABS: 0.2 10*3/uL (ref 0.0–0.7)
EOS PCT: 2 %
HCT: 42.8 % (ref 36.0–46.0)
Hemoglobin: 13.4 g/dL (ref 12.0–15.0)
LYMPHS PCT: 21 %
Lymphs Abs: 1.8 10*3/uL (ref 0.7–4.0)
MCH: 27.9 pg (ref 26.0–34.0)
MCHC: 31.3 g/dL (ref 30.0–36.0)
MCV: 89.2 fL (ref 78.0–100.0)
Monocytes Absolute: 0.9 10*3/uL (ref 0.1–1.0)
Monocytes Relative: 10 %
Neutro Abs: 5.8 10*3/uL (ref 1.7–7.7)
Neutrophils Relative %: 67 %
PLATELETS: 208 10*3/uL (ref 150–400)
RBC: 4.8 MIL/uL (ref 3.87–5.11)
RDW: 14.3 % (ref 11.5–15.5)
WBC: 8.7 10*3/uL (ref 4.0–10.5)

## 2018-04-03 DIAGNOSIS — I739 Peripheral vascular disease, unspecified: Secondary | ICD-10-CM | POA: Diagnosis not present

## 2018-04-03 DIAGNOSIS — L603 Nail dystrophy: Secondary | ICD-10-CM | POA: Diagnosis not present

## 2018-04-04 DIAGNOSIS — M9902 Segmental and somatic dysfunction of thoracic region: Secondary | ICD-10-CM | POA: Diagnosis not present

## 2018-04-04 DIAGNOSIS — M546 Pain in thoracic spine: Secondary | ICD-10-CM | POA: Diagnosis not present

## 2018-04-04 DIAGNOSIS — M9901 Segmental and somatic dysfunction of cervical region: Secondary | ICD-10-CM | POA: Diagnosis not present

## 2018-04-04 DIAGNOSIS — M542 Cervicalgia: Secondary | ICD-10-CM | POA: Diagnosis not present

## 2018-04-15 DIAGNOSIS — M546 Pain in thoracic spine: Secondary | ICD-10-CM | POA: Diagnosis not present

## 2018-04-15 DIAGNOSIS — M542 Cervicalgia: Secondary | ICD-10-CM | POA: Diagnosis not present

## 2018-04-15 DIAGNOSIS — M9902 Segmental and somatic dysfunction of thoracic region: Secondary | ICD-10-CM | POA: Diagnosis not present

## 2018-04-15 DIAGNOSIS — M9901 Segmental and somatic dysfunction of cervical region: Secondary | ICD-10-CM | POA: Diagnosis not present

## 2018-04-22 ENCOUNTER — Encounter: Payer: Self-pay | Admitting: Internal Medicine

## 2018-04-22 ENCOUNTER — Ambulatory Visit (INDEPENDENT_AMBULATORY_CARE_PROVIDER_SITE_OTHER): Payer: Medicare Other | Admitting: *Deleted

## 2018-04-22 ENCOUNTER — Telehealth: Payer: Self-pay | Admitting: Cardiology

## 2018-04-22 DIAGNOSIS — I442 Atrioventricular block, complete: Secondary | ICD-10-CM

## 2018-04-22 NOTE — Telephone Encounter (Signed)
LMOVM reminding pt to send remote transmission.   

## 2018-04-22 NOTE — Progress Notes (Signed)
Location:    [facility] Room Number: 156/W Place of Service:  SNF [phone]) Provider:  Freddi Starr, MD  Patient Care Team: Virgie Dad, MD as PCP - General (Internal Medicine) Rolm Baptise as Physician Assistant (Internal Medicine)  Extended Emergency Contact Information Primary Emergency Contact: Adin Hector, [place] [address] [name] of [place] Phone: [phone] Relation: Son Secondary Emergency Contact: [name], [place] [address] [name] of [place] Phone: [phone] Mobile Phone: [phone] Relation: Son  Code Status:  DNR Goals of care: Advanced Directive information Advanced Directives 04/01/2018  Does Patient Have a Medical Advance Directive? Yes  Type of Advance Directive Out of facility DNR (pink MOST or yellow form)  Does patient want to make changes to medical advance directive? No - Patient declined  Copy of [place] in Chart? No - copy requested  Would patient like information on creating a medical advance directive? No - Patient declined  Pre-existing out of facility DNR order (yellow form or pink MOST form) -     Chief Complaint  Patient presents with  . Medical Management of Chronic Issues    Routine Visit    HPI:  Pt is a 84 y.o. female seen today for medical management of chronic diseases.     Past Medical History:  Diagnosis Date  . Atrial fibrillation ([place])   . Bradycardia   . Breast nodule 11/15/2014  . Breast pain, left 11/15/2014  . Carotid artery disease ([place])   . Dizziness   . Dyspnea    Previous CPX suggesting possible restrictive physiology, respiratory muscle fatigue, diastolic dysfunction  . Essential hypertension, benign   . Hyperlipidemia   . Oxygen dependent    2 liter  . Pacemaker [place]   . Rib pain on left side 11/15/2014  . Seasonal allergies   . Shingles 05/04/2015  . Stroke ([place])   . Toe fracture,  left    left big toe   Past Surgical History:  Procedure Laterality Date  . COLONOSCOPY  02/22/2012   Procedure: COLONOSCOPY;  Surgeon: Rogene Houston, MD;  [facility];  Service: Endoscopy;  Laterality: N/A;  100  . KNEE SURGERY     bilateral  . PACEMAKER IMPLANT N/A 07/17/2017   Procedure: Pacemaker Implant;  Surgeon: Deboraha Sprang, MD;  Location: [facility];  Service: Cardiovascular;  Laterality: N/A;    Allergies  Allergen Reactions  . Sulfur Swelling and Rash    Outpatient Encounter Medications as of 04/22/2018  Medication Sig  . acetaminophen (TYLENOL) 500 MG tablet Take 1,000 mg by mouth 2 (two) times daily. Max of 3 grams acetaminophen per 24 hrs from all sources.  Marland Kitchen amLODipine (NORVASC) 2.5 MG tablet Take 2.5 mg by mouth daily.  Jearl Klinefelter ELLIPTA 62.5-25 MCG/INH AEPB Inhale 1 Inhaler into the lungs daily.   Marland Kitchen apixaban (ELIQUIS) 2.5 MG TABS tablet Take 1 tablet (2.5 mg total) by mouth 2 (two) times daily. Resume 07/20/17  . atorvastatin (LIPITOR) 80 MG tablet Take 1 tablet (80 mg total) by mouth daily at 6 PM.  . cycloSPORINE (RESTASIS) 0.05 % ophthalmic emulsion Place 1 drop into both eyes every 12 (twelve) hours.   . diclofenac sodium (VOLTAREN) 1 % GEL Apply 1 application topically 4 (four) times daily as needed. Add to body where needed  .  diltiazem (CARDIZEM CD) 180 MG 24 hr capsule TAKE ONE CAPSULE BY MOUTH ONCE DAILY.  . furosemide (LASIX) 40 MG tablet Take 40 mg by mouth 2 (two) times daily.  Marland Kitchen gabapentin (NEURONTIN) 100 MG capsule Take 100 mg by mouth 2 (two) times daily.  . hydrocortisone (ANUSOL-HC) 2.5 % rectal cream Place 1 application rectally 2 (two) times daily as needed for hemorrhoids or anal itching.  . Melatonin 5 MG TABS Take 5 mg by mouth at bedtime as needed.  . Multiple Vitamin (MULITIVITAMIN WITH MINERALS) TABS Take 1 tablet by mouth every other day.   . OXYGEN Inhale 2 L into the lungs 2 (two) times daily. 3:15pm to 11:15pm 11:15pm  to 07:15am  . pantoprazole (PROTONIX) 40 MG tablet Take 40 mg by mouth daily.  Vladimir Faster Glycol-Propyl Glycol (SYSTANE OP) Place 1 drop into both eyes 3 (three) times daily. For dry eyes  . polyethylene glycol (MIRALAX / GLYCOLAX) packet Take 17 g by mouth daily.  . potassium chloride (K-DUR,KLOR-CON) 10 MEQ tablet Take 20 mEq by mouth daily.  Marland Kitchen PROAIR HFA 108 (90 BASE) MCG/ACT inhaler Inhale 2 puffs into the lungs 3 (three) times daily as needed for wheezing (cough).   . traMADol (ULTRAM) 50 MG tablet Take 1 tablet (50 mg total) by mouth 3 (three) times daily.  . [DISCONTINUED] Albuterol (VENTOLIN IN) Inhale into the lungs.    . [DISCONTINUED] gabapentin (NEURONTIN) 100 MG capsule Take 1 capsule (100 mg total) by mouth 3 (three) times daily. (Patient taking differently: Take 100 mg by mouth 2 (two) times daily. )   No facility-administered encounter medications on file as of 04/22/2018.      Review of Systems  Immunization History  Administered Date(s) Administered  . Influenza-Unspecified 10/10/2013, 09/11/2017  . Pneumococcal Conjugate-13 09/13/2017  . Tdap 09/16/2017  . Zoster 12/05/2017   Pertinent  Health Maintenance Due  Topic Date Due  . DEXA SCAN  05/01/2018 (Originally 01/15/1986)  . INFLUENZA VACCINE  07/10/2018  . PNA vac Low Risk Adult (2 of 2 - PPSV23) 09/13/2018   Fall Risk  09/02/2017  Falls in the past year? Yes  Number falls in past yr: 1  Injury with Fall? No   Functional Status Survey:    Vitals:   04/22/18 1339  BP: 133/62  Pulse: (!) 59  Resp: 18  Temp: 97.6 F (36.4 C)  TempSrc: Oral  SpO2: 95%  Weight: 144 lb 12.8 oz (65.7 kg)  Height: 5\' 4"  (1.626 m)   Body mass index is 24.85 kg/m. Physical Exam  Labs reviewed: Recent Labs    07/15/17 0805  01/06/18 0800 02/06/18 0630 04/02/18 0920  NA 134*   < > 140 139 140  K 5.5*   < > 4.1 3.9 4.0  CL 103   < > 103 101 101  CO2 19*   < > 27 26 26   GLUCOSE 94   < > 85 93 125*  BUN 53*   < >  25* 30* 29*  CREATININE 1.52*   < > 0.91 1.00 1.15*  CALCIUM 8.7*   < > 8.7* 9.0 9.1  MG 2.3  --   --   --   --    < > = values in this interval not displayed.   Recent Labs    10/21/17 0300 12/28/17 1040 04/02/18 0920  AST 18 27 25   ALT 13* 16 17  ALKPHOS 115 132* 144*  BILITOT 0.6 1.0 1.2  PROT 6.1* 7.1 7.2  ALBUMIN 3.3* 3.7 3.8   Recent Labs    12/07/17 0930 02/06/18 0630 04/02/18 0920  WBC 5.7 6.6 8.7  NEUTROABS 3.1 3.7 5.8  HGB 12.0 13.6 13.4  HCT 38.6 43.4 42.8  MCV 88.7 87.3 89.2  PLT 180 219 208   Lab Results  Component Value Date   TSH 1.446 07/15/2017   Lab Results  Component Value Date   HGBA1C 5.5 12/06/2016   Lab Results  Component Value Date   CHOL 113 12/07/2017   HDL 56 12/07/2017   LDLCALC 48 12/07/2017   TRIG 47 12/07/2017   CHOLHDL 2.0 12/07/2017    Significant Diagnostic Results in last 30 days:  No results found.  Assessment/Plan There are no diagnoses linked to this encounter.   Family/ staff Communication:   Labs/tests ordered:      This encounter was created in error - please disregard.

## 2018-04-23 NOTE — Progress Notes (Signed)
Remote pacemaker transmission.   

## 2018-04-24 ENCOUNTER — Encounter: Payer: Self-pay | Admitting: Internal Medicine

## 2018-04-24 ENCOUNTER — Encounter: Payer: Self-pay | Admitting: Cardiology

## 2018-04-24 ENCOUNTER — Non-Acute Institutional Stay (SKILLED_NURSING_FACILITY): Payer: Medicare Other | Admitting: Internal Medicine

## 2018-04-24 DIAGNOSIS — I482 Chronic atrial fibrillation, unspecified: Secondary | ICD-10-CM

## 2018-04-24 DIAGNOSIS — M545 Low back pain, unspecified: Secondary | ICD-10-CM

## 2018-04-24 DIAGNOSIS — I495 Sick sinus syndrome: Secondary | ICD-10-CM | POA: Diagnosis not present

## 2018-04-24 DIAGNOSIS — G8929 Other chronic pain: Secondary | ICD-10-CM | POA: Diagnosis not present

## 2018-04-24 DIAGNOSIS — I639 Cerebral infarction, unspecified: Secondary | ICD-10-CM | POA: Diagnosis not present

## 2018-04-24 DIAGNOSIS — I10 Essential (primary) hypertension: Secondary | ICD-10-CM | POA: Diagnosis not present

## 2018-04-24 DIAGNOSIS — I5033 Acute on chronic diastolic (congestive) heart failure: Secondary | ICD-10-CM

## 2018-04-24 NOTE — Progress Notes (Signed)
Location:   [place] Room Number: 156/W Place of Service:  SNF [phone]) Provider:  Freddi Starr, MD  Patient Care Team: Virgie Dad, MD as PCP - General (Internal Medicine) Rolm Baptise as Physician Assistant (Internal Medicine)   Extended Emergency Contact Information Primary Emergency Contact: Adin Hector, [place] [address] [name] of [place] Phone: [phone] Relation: Son Secondary Emergency Contact: [name], [place] [address] [name] of [place] Phone: [phone] Mobile Phone: [phone] Relation: Son  Code Status:  DNR Goals of care: Advanced Directive information Advanced Directives 04/24/2018  Does Patient Have a Medical Advance Directive? Yes  Type of Advance Directive Out of facility DNR (pink MOST or yellow form)  Does patient want to make changes to medical advance directive? No - Patient declined  Copy of [place] in Chart? No - copy requested  Would patient like information on creating a medical advance directive? No - Patient declined  Pre-existing out of facility DNR order (yellow form or pink MOST form) -     Chief Complaint  Patient presents with  . Medical Management of Chronic Issues    Patient being seen for Routine Visit  For medical management of chronic medical conditions including atrial fibrillation-history of pacemaker with sick sinus syndrome-CVA- carotid stenosis-diastolic CHF-hyperlipidemia as well as hypertension COPD and chronic arthritic pain  HPI:  Pt is a 84 y.o. female seen today for medical management of chronic diseases.  As noted above.  She appears to be enjoying a period of stability.  Weight appears relatively stable in the mid 140s she appears to have a pretty good appetite per chart review.  She does have a history of CHF and edema appears to be at baseline she is not complaining of shortness of breath  she is on Lasix with potassium supplementation.  She also has a history of atrial fibrillation she is on Eliquis-she is also on diltiazem for rate control.  He does have a history of sick sinus syndrome and does have a pacemaker which is checked regularly by cardiology.  Regards to CVA she continues with some expressive aphasia but is able to communicate quite well- she is on a statin LDL was 48 on lab done in [facility] is also on low-dose Eliquis aspirin was held because of history of GI bleed.  She is also on Eliquis with a history of ICA stenosis but son does not want aggressive interventions-  She also history of hypertension on low-dose Norvasc in addition to Lasix this appears stable blood pressure today was 124/56.  She does have a history of hemorrhoid discomfort not thought to be a good candidate for banding because she is on Eliquis-as well as her advanced age and comorbidities.  She also has a history of a dilated pancreatic duct with a mildly  elevated alkaline phosphatase at baseline she appears to be essentially asymptomatic per GI no aggressive work-up at this time.   Currently she has no acute complaints she does have a small cystlike area on the right side of her abdomen she says is not painful but has been there for some time    Past Medical History:  Diagnosis Date  . Atrial fibrillation ([place])   . Bradycardia   . Breast nodule 11/15/2014  . Breast pain, left 11/15/2014  . Carotid artery disease ([place])   .  Dizziness   . Dyspnea    Previous CPX suggesting possible restrictive physiology, respiratory muscle fatigue, diastolic dysfunction  . Essential hypertension, benign   . Hyperlipidemia   . Oxygen dependent    2 liter  . Pacemaker [place]   . Rib pain on left side 11/15/2014  . Seasonal allergies   . Shingles 05/04/2015  . Stroke ([place])   . Toe fracture, left    left big toe   Past Surgical History:  Procedure Laterality Date  . COLONOSCOPY  02/22/2012    Procedure: COLONOSCOPY;  Surgeon: Rogene Houston, MD;  [facility];  Service: Endoscopy;  Laterality: N/A;  100  . KNEE SURGERY     bilateral  . PACEMAKER IMPLANT N/A 07/17/2017   Procedure: Pacemaker Implant;  Surgeon: Deboraha Sprang, MD;  Location: [place] CV LAB;  Service: Cardiovascular;  Laterality: N/A;    Allergies  Allergen Reactions  . Sulfur Swelling and Rash    Outpatient Encounter Medications as of 04/24/2018  Medication Sig  . acetaminophen (TYLENOL) 500 MG tablet Take 1,000 mg by mouth 2 (two) times daily. Max of 3 grams acetaminophen per 24 hrs from all sources.  Marland Kitchen amLODipine (NORVASC) 2.5 MG tablet Take 2.5 mg by mouth daily.  Jearl Klinefelter ELLIPTA 62.5-25 MCG/INH AEPB Inhale 1 Inhaler into the lungs daily.   Marland Kitchen apixaban (ELIQUIS) 2.5 MG TABS tablet Take 1 tablet (2.5 mg total) by mouth 2 (two) times daily. Resume 07/20/17  . atorvastatin (LIPITOR) 80 MG tablet Take 1 tablet (80 mg total) by mouth daily at 6 PM.  . cycloSPORINE (RESTASIS) 0.05 % ophthalmic emulsion Place 1 drop into both eyes every 12 (twelve) hours.   . diclofenac sodium (VOLTAREN) 1 % GEL Apply 1 application topically 4 (four) times daily as needed. Add to body where needed  . diltiazem (CARDIZEM CD) 180 MG 24 hr capsule TAKE ONE CAPSULE BY MOUTH ONCE DAILY.  . furosemide (LASIX) 40 MG tablet Take 40 mg by mouth 2 (two) times daily.  Marland Kitchen gabapentin (NEURONTIN) 100 MG capsule Take 100 mg by mouth 2 (two) times daily.  . hydrocortisone (ANUSOL-HC) 2.5 % rectal cream Place 1 application rectally 2 (two) times daily as needed for hemorrhoids or anal itching.  . Melatonin 5 MG TABS Take 5 mg by mouth at bedtime as needed.  . Multiple Vitamin (MULITIVITAMIN WITH MINERALS) TABS Take 1 tablet by mouth every other day.   . OXYGEN Inhale 2 L into the lungs 2 (two) times daily. 3:15pm to 11:15pm 11:15pm to 07:15am  . pantoprazole (PROTONIX) 40 MG tablet Take 40 mg by mouth daily.  Vladimir Faster Glycol-Propyl  Glycol (SYSTANE OP) Place 1 drop into both eyes 3 (three) times daily. For dry eyes  . polyethylene glycol (MIRALAX / GLYCOLAX) packet Take 17 g by mouth daily.  . potassium chloride (K-DUR,KLOR-CON) 10 MEQ tablet Take 20 mEq by mouth daily.  Marland Kitchen PROAIR HFA 108 (90 BASE) MCG/ACT inhaler Inhale 2 puffs into the lungs 3 (three) times daily as needed for wheezing (cough).   . traMADol (ULTRAM) 50 MG tablet Take 1 tablet (50 mg total) by mouth 3 (three) times daily.  . [DISCONTINUED] Albuterol (VENTOLIN IN) Inhale into the lungs.     No facility-administered encounter medications on file as of 04/24/2018.      Review of Systems   General she is not complaining of any fever chills weight appears to be relatively stable in the mid 140s.  Skin she  does not complain of rashes or itching does appear to have a small flesh-colored cyst right side of abdomen.  She says this is not really tender  Eyes does have a history of retinal vein occlusion says her vision is at baseline she has prescription lenses.  Ears nose mouth and throat is not complaining of sore throat or nasal discharge.  Respiratory is not complaining of shortness of breath or cough.  Cardiac does not complain of chest pain has chronic lower extremity edema at baseline.  GI is not complaining of abdominal discomfort nausea vomiting diarrhea constipation.  Rectal does have a history of hemorrhoids is not really complaining of hemorrhoid pain today.  Musculoskeletal has diffuse arthritic discomfort but this appears stabilized does not really specifically complain of The knee or back pain today neurologic is not complaining of dizziness headache or syncope.   psych  Is not complaining feeling anxious or depressed appears to be in good spirits  Immunization History  Administered Date(s) Administered  . Influenza-Unspecified 10/10/2013, 09/11/2017  . Pneumococcal Conjugate-13 09/13/2017  . Tdap 09/16/2017  . Zoster 12/05/2017    Pertinent  Health Maintenance Due  Topic Date Due  . DEXA SCAN  05/01/2018 (Originally 01/15/1986)  . INFLUENZA VACCINE  07/10/2018  . PNA vac Low Risk Adult (2 of 2 - PPSV23) 09/13/2018   Fall Risk  09/02/2017  Falls in the past year? Yes  Number falls in past yr: 1  Injury with Fall? No   Functional Status Survey:    Vitals:   04/24/18 1249  BP: 133/62  Pulse: (!) 59  Resp: 18  Temp: 97.6 F (36.4 C)  TempSrc: Oral  SpO2: 97%  Weight: 147 lb 6.4 oz (66.9 kg)  Height: 5\' 4"  (1.626 m)  Of note manual blood pressure was 124/56-- pulse was 60 Physical Exam   In general this is a pleasant elderly female sitting comfortably in her wheelchair she appears to be in good spirits.  Her skin is warm and dry on the right side of her abdomen there is a small approximately 1 cm in diameter raised hardened area that appears to have a head on it this appears to be more of a cyst it is flesh-colored nontender nonerythematous.  Eyes she has prescription lenses visual acuity appears grossly intact sclera and conjunctive are clear.  Oropharynx clear mucous membranes moist.  Her chest is clear to auscultation there is no labored breathing.  Heart is largely regular with an occasional irregular beat and a slight systolic murmur she has baseline lower extremity edema bilaterally.  Her abdomen is soft nontender with positive bowel sounds.  Musculoskeletal moves all her extremities x4 at baseline she does have limited range of motion in her knees-largely ambulates in a wheelchair- arthritic changes prominent of her knees bilaterally.  Neurologic has expressive aphasia but does well despite having a somewhat delayed speech pattern this appears baseline with previous exam she is alert moves all her extremities at baseline   Psych she is largely alert and oriented pleasant and appropriate  Labs reviewed: Recent Labs    07/15/17 0805  01/06/18 0800 02/06/18 0630 04/02/18 0920  NA 134*   <  > 140 139 140  K 5.5*   < > 4.1 3.9 4.0  CL 103   < > 103 101 101  CO2 19*   < > 27 26 26   GLUCOSE 94   < > 85 93 125*  BUN 53*   < > 25* 30* 29*  CREATININE 1.52*   < > 0.91 1.00 1.15*  CALCIUM 8.7*   < > 8.7* 9.0 9.1  MG 2.3  --   --   --   --    < > = values in this interval not displayed.   Recent Labs    10/21/17 0300 12/28/17 1040 04/02/18 0920  AST 18 27 25   ALT 13* 16 17  ALKPHOS 115 132* 144*  BILITOT 0.6 1.0 1.2  PROT 6.1* 7.1 7.2  ALBUMIN 3.3* 3.7 3.8   Recent Labs    12/07/17 0930 02/06/18 0630 04/02/18 0920  WBC 5.7 6.6 8.7  NEUTROABS 3.1 3.7 5.8  HGB 12.0 13.6 13.4  HCT 38.6 43.4 42.8  MCV 88.7 87.3 89.2  PLT 180 219 208   Lab Results  Component Value Date   TSH 1.446 07/15/2017   Lab Results  Component Value Date   HGBA1C 5.5 12/06/2016   Lab Results  Component Value Date   CHOL 113 12/07/2017   HDL 56 12/07/2017   LDLCALC 48 12/07/2017   TRIG 47 12/07/2017   CHOLHDL 2.0 12/07/2017    Significant Diagnostic Results in last 30 days:  No results found.  Assessment/Plan  #1-history of atrial fibrillation continues on low-dose Eliquis as well as diltiazem-this appears rate controlled at this point will monitor.  2.  History of pacemaker this is followed by cardiology she does not have sick sinus syndrome but this appears to be stable and relatively asymptomatic.  3.  History of diastolic CHF this appears stable she has had some weight gain but I suspect this is more appetite related edema appears to be at baseline does not complain of increased shortness of breath or chest pain she is on Lasix 40 mg twice a day as well as potassium supplementation metabolic panel late last month was stable.  4.  History of CVA involving the left MCA-this appears stable she is on low-dose Eliquis aspirin been held because of a GI bleed Eliquis is low dose-is also on a statin LDL was 48 on December lab.  5.  History of dilated pancreatic.this appears  relatively asymptomatic and has been followed by GI with recommendation for no aggressive work-up currently-she does not really complain of issues with this.  6.  History of dysphasia diffuse arthritic pain more prominently of her back and knees she is not really complaining of pain today she does have orders for tramadol as well as Voltaren gel and is also on Neurontin.  7.  History of hypertension this appears stable on Norvasc as well as Lasix is also on diltiazem again this is for A. fib as well.  His  8.-  History of COPD and or Anuro Ellipta also has [employer] as needed this appears stable currently does not complain of increased shortness of breath no wheezing--.  9.  History of retinal vein occlusion-this appears stabilized she does not really complain of visual changes she does have prescription lenses.  10.  Insomnia she is on melatonin apparently has tolerated this well.  11.  Suspected cyst abdominal area--does not appear to be infected- consider dermatology consult   CPT- 99310-of note greater than 35 minutes spent assessing patient reviewing her chart labs discussing her status with nursing staff- coordinating and formulating a plan of care for numerous diagnoses- of note greater than 50% of time spent coordinating plan of care

## 2018-04-28 ENCOUNTER — Non-Acute Institutional Stay (SKILLED_NURSING_FACILITY): Payer: Medicare Other | Admitting: Internal Medicine

## 2018-04-28 DIAGNOSIS — I5032 Chronic diastolic (congestive) heart failure: Secondary | ICD-10-CM

## 2018-04-28 DIAGNOSIS — R600 Localized edema: Secondary | ICD-10-CM

## 2018-04-29 ENCOUNTER — Encounter (HOSPITAL_COMMUNITY)
Admission: RE | Admit: 2018-04-29 | Discharge: 2018-04-29 | Disposition: A | Discharge status: Home / Self Care | Payer: Medicare Other | Source: Skilled Nursing Facility | Attending: Internal Medicine | Admitting: Internal Medicine

## 2018-04-29 ENCOUNTER — Encounter: Payer: Self-pay | Admitting: Internal Medicine

## 2018-04-29 DIAGNOSIS — I442 Atrioventricular block, complete: Secondary | ICD-10-CM | POA: Diagnosis not present

## 2018-04-29 DIAGNOSIS — I63312 Cerebral infarction due to thrombosis of left middle cerebral artery: Secondary | ICD-10-CM | POA: Insufficient documentation

## 2018-04-29 DIAGNOSIS — M25512 Pain in left shoulder: Secondary | ICD-10-CM | POA: Diagnosis not present

## 2018-04-29 DIAGNOSIS — M6281 Muscle weakness (generalized): Secondary | ICD-10-CM | POA: Insufficient documentation

## 2018-04-29 DIAGNOSIS — E785 Hyperlipidemia, unspecified: Secondary | ICD-10-CM | POA: Diagnosis not present

## 2018-04-29 DIAGNOSIS — R279 Unspecified lack of coordination: Secondary | ICD-10-CM | POA: Diagnosis not present

## 2018-04-29 LAB — CUP PACEART REMOTE DEVICE CHECK
Battery Remaining Percentage: 95 %
Battery Voltage: 3.02 V
Implantable Pulse Generator Implant Date: 20180808
Lead Channel Impedance Value: 640 Ohm
Lead Channel Pacing Threshold Pulse Width: 0.5 ms
Lead Channel Setting Pacing Amplitude: 2.5 V
Lead Channel Setting Pacing Pulse Width: 0.5 ms
MDC IDC LEAD IMPLANT DT: 20180808
MDC IDC LEAD LOCATION: 753860
MDC IDC MSMT BATTERY REMAINING LONGEVITY: 119 mo
MDC IDC MSMT LEADCHNL RV PACING THRESHOLD AMPLITUDE: 0.5 V
MDC IDC MSMT LEADCHNL RV SENSING INTR AMPL: 4.6 mV
MDC IDC PG SERIAL: 7957820
MDC IDC SESS DTM: 20190514190115
MDC IDC SET LEADCHNL RV SENSING SENSITIVITY: 0.5 mV
MDC IDC STAT BRADY RV PERCENT PACED: 99 %
Pulse Gen Model: 1272

## 2018-04-29 LAB — COMPREHENSIVE METABOLIC PANEL
ALK PHOS: 135 U/L — AB (ref 38–126)
ALT: 18 U/L (ref 14–54)
ANION GAP: 11 (ref 5–15)
AST: 22 U/L (ref 15–41)
Albumin: 3.8 g/dL (ref 3.5–5.0)
BILIRUBIN TOTAL: 0.8 mg/dL (ref 0.3–1.2)
BUN: 27 mg/dL — ABNORMAL HIGH (ref 6–20)
CALCIUM: 8.8 mg/dL — AB (ref 8.9–10.3)
CO2: 28 mmol/L (ref 22–32)
CREATININE: 1.04 mg/dL — AB (ref 0.44–1.00)
Chloride: 101 mmol/L (ref 101–111)
GFR, EST AFRICAN AMERICAN: 50 mL/min — AB (ref >60)
GFR, EST NON AFRICAN AMERICAN: 44 mL/min — AB (ref >60)
Glucose, Bld: 93 mg/dL (ref 65–99)
Potassium: 4.2 mmol/L (ref 3.5–5.1)
Sodium: 140 mmol/L (ref 135–145)
TOTAL PROTEIN: 6.9 g/dL (ref 6.5–8.1)

## 2018-04-29 NOTE — Progress Notes (Signed)
Location:   [place] Room Number: 156/W Place of Service:  SNF [phone]) Provider:  Clydene Fake, MD  Patient Care Team: Virgie Dad, MD as PCP - General (Internal Medicine) Rolm Baptise as Physician Assistant (Internal Medicine)  Extended Emergency Contact Information Primary Emergency Contact: Adin Hector, [place] [address] [name] of [place] Phone: [phone] Relation: Son Secondary Emergency Contact: [name], [place] [address] [name] of [place] Phone: [phone] Mobile Phone: [phone] Relation: Son  Code Status:  DNR Goals of care: Advanced Directive information Advanced Directives 04/29/2018  Does Patient Have a Medical Advance Directive? Yes  Type of Advance Directive Out of facility DNR (pink MOST or yellow form)  Does patient want to make changes to medical advance directive? No - Patient declined  Copy of [place] in Chart? No - copy requested  Would patient like information on creating a medical advance directive? No - Patient declined  Pre-existing out of facility DNR order (yellow form or pink MOST form) -     Chief Complaint  Patient presents with  . Acute Visit    Patient is being seen for under eye swelling both eyes    HPI:  Pt is a 84 y.o. female seen today for an acute visit for c/o Swelling under her eye. Patient has h/o Atrial fibrillation on Eliquis, S/P Pacemaker for sick sinus,S/P Acute CVA involving left MCA , ICA stenosis,Diastolic CHF,Dilated Pancreatic Duct, Hemorrhoids,hyperlipidemia, And Hypertension And Arthritis with Chronic Knee and Back Pain.right retinal vein occlusion.  Patient wanted to see me today as she has noticed some increased swelling below her eyes. She denies any pain. No SOB Chest pain or worsening swelling anywhere else.Her weight is stable at 145 lbs She also continues to have multiple joint  pains which are controlled with Ultram and Voltaren gel  Past Medical History:  Diagnosis Date  . Atrial fibrillation ([place])   . Bradycardia   . Breast nodule 11/15/2014  . Breast pain, left 11/15/2014  . Carotid artery disease ([place])   . Dizziness   . Dyspnea    Previous CPX suggesting possible restrictive physiology, respiratory muscle fatigue, diastolic dysfunction  . Essential hypertension, benign   . Hyperlipidemia   . Oxygen dependent    2 liter  . Pacemaker [place]   . Rib pain on left side 11/15/2014  . Seasonal allergies   . Shingles 05/04/2015  . Stroke ([place])   . Toe fracture, left    left big toe   Past Surgical History:  Procedure Laterality Date  . COLONOSCOPY  02/22/2012   Procedure: COLONOSCOPY;  Surgeon: Rogene Houston, MD;  [facility];  Service: Endoscopy;  Laterality: N/A;  100  . KNEE SURGERY     bilateral  . PACEMAKER IMPLANT N/A 07/17/2017   Procedure: Pacemaker Implant;  Surgeon: Deboraha Sprang, MD;  Location: [place] CV LAB;  Service: Cardiovascular;  Laterality: N/A;    Allergies  Allergen Reactions  . Sulfur Swelling and Rash    Outpatient Encounter Medications as of 04/28/2018  Medication Sig  . acetaminophen (TYLENOL) 500 MG tablet Take 1,000 mg by mouth 2 (two) times daily. Max of 3 grams acetaminophen per 24 hrs from all sources.  Marland Kitchen amLODipine (NORVASC) 2.5 MG tablet Take 2.5 mg by mouth daily.  Jearl Klinefelter ELLIPTA 62.5-25  MCG/INH AEPB Inhale 1 Inhaler into the lungs daily.   Marland Kitchen apixaban (ELIQUIS) 2.5 MG TABS tablet Take 1 tablet (2.5 mg total) by mouth 2 (two) times daily. Resume 07/20/17  . atorvastatin (LIPITOR) 80 MG tablet Take 1 tablet (80 mg total) by mouth daily at 6 PM.  . cycloSPORINE (RESTASIS) 0.05 % ophthalmic emulsion Place 1 drop into both eyes every 12 (twelve) hours.   . diclofenac sodium (VOLTAREN) 1 % GEL Apply 1 application topically 4 (four) times daily as needed. Add to body where needed  . diltiazem (CARDIZEM CD)  180 MG 24 hr capsule TAKE ONE CAPSULE BY MOUTH ONCE DAILY.  . furosemide (LASIX) 40 MG tablet Take 40 mg by mouth 2 (two) times daily.  Marland Kitchen gabapentin (NEURONTIN) 100 MG capsule Take 100 mg by mouth 2 (two) times daily.  . hydrocortisone (ANUSOL-HC) 2.5 % rectal cream Place 1 application rectally 2 (two) times daily as needed for hemorrhoids or anal itching.  . Melatonin 5 MG TABS Take 5 mg by mouth at bedtime as needed.  . Multiple Vitamin (MULITIVITAMIN WITH MINERALS) TABS Take 1 tablet by mouth every other day.   . OXYGEN Inhale 2 L into the lungs 2 (two) times daily. 3:15pm to 11:15pm 11:15pm to 07:15am  . pantoprazole (PROTONIX) 40 MG tablet Take 40 mg by mouth daily.  Vladimir Faster Glycol-Propyl Glycol (SYSTANE OP) Place 1 drop into both eyes 3 (three) times daily. For dry eyes  . polyethylene glycol (MIRALAX / GLYCOLAX) packet Take 17 g by mouth daily.  . potassium chloride (K-DUR,KLOR-CON) 10 MEQ tablet Take 20 mEq by mouth daily.  Marland Kitchen PROAIR HFA 108 (90 BASE) MCG/ACT inhaler Inhale 2 puffs into the lungs 3 (three) times daily as needed for wheezing (cough).   . traMADol (ULTRAM) 50 MG tablet Take 1 tablet (50 mg total) by mouth 3 (three) times daily.   No facility-administered encounter medications on file as of 04/28/2018.      Review of Systems  Constitutional: Negative.   HENT: Negative.   Respiratory: Negative.   Cardiovascular: Negative.   Gastrointestinal: Negative.   Genitourinary: Negative.   Musculoskeletal: Positive for arthralgias, back pain and myalgias.    Immunization History  Administered Date(s) Administered  . Influenza-Unspecified 10/10/2013, 09/11/2017  . Pneumococcal Conjugate-13 09/13/2017  . Tdap 09/16/2017  . Zoster 12/05/2017   Pertinent  Health Maintenance Due  Topic Date Due  . DEXA SCAN  05/01/2018 (Originally 01/15/1986)  . INFLUENZA VACCINE  07/10/2018  . PNA vac Low Risk Adult (2 of 2 - PPSV23) 09/13/2018   Fall Risk  09/02/2017  Falls in the  past year? Yes  Number falls in past yr: 1  Injury with Fall? No   Functional Status Survey:    Vitals:   04/29/18 1035  BP: 140/67  Pulse: 69  Resp: 17  Temp: (!) 96.9 F (36.1 C)  TempSrc: Oral   There is no height or weight on file to calculate BMI. Physical Exam  Constitutional: She appears well-developed and well-nourished.  HENT:  Head: Normocephalic.  Mouth/Throat: Oropharynx is clear and moist.  Eyes: Right eye exhibits no discharge. Left eye exhibits no discharge.  Soft Tissue Swelling below both Eyes. No Tenderness or Redness.  Neck: Neck supple.  Cardiovascular: Normal rate and regular rhythm.  Pulmonary/Chest: Effort normal and breath sounds normal.  Abdominal: Soft. Bowel sounds are normal.  Musculoskeletal:  Moderate Edema Bilateral with Chronic Changes  Neurological: She is alert.  Skin: Skin is warm and  dry.    Labs reviewed: Recent Labs    07/15/17 0805  02/06/18 0630 04/02/18 0920 04/29/18 0711  NA 134*   < > 139 140 140  K 5.5*   < > 3.9 4.0 4.2  CL 103   < > 101 101 101  CO2 19*   < > 26 26 28   GLUCOSE 94   < > 93 125* 93  BUN 53*   < > 30* 29* 27*  CREATININE 1.52*   < > 1.00 1.15* 1.04*  CALCIUM 8.7*   < > 9.0 9.1 8.8*  MG 2.3  --   --   --   --    < > = values in this interval not displayed.   Recent Labs    12/28/17 1040 04/02/18 0920 04/29/18 0711  AST 27 25 22   ALT 16 17 18   ALKPHOS 132* 144* 135*  BILITOT 1.0 1.2 0.8  PROT 7.1 7.2 6.9  ALBUMIN 3.7 3.8 3.8   Recent Labs    12/07/17 0930 02/06/18 0630 04/02/18 0920  WBC 5.7 6.6 8.7  NEUTROABS 3.1 3.7 5.8  HGB 12.0 13.6 13.4  HCT 38.6 43.4 42.8  MCV 88.7 87.3 89.2  PLT 180 219 208   Lab Results  Component Value Date   TSH 1.446 07/15/2017   Lab Results  Component Value Date   HGBA1C 5.5 12/06/2016   Lab Results  Component Value Date   CHOL 113 12/07/2017   HDL 56 12/07/2017   LDLCALC 48 12/07/2017   TRIG 47 12/07/2017   CHOLHDL 2.0 12/07/2017     Significant Diagnostic Results in last 30 days:  No results found.  Assessment/Plan Bilateral Puffy Eyes Will Repeat CMP to follow Renal Afunction Patient was reassured.  H/o Chronic Diastolic Dysfunction Continue on Lasix BID Repeat BMP Weight is stable  Family/ staff Communication:   Labs/tests ordered:   Total time spent in this patient care encounter was 25_ minutes; greater than 50% of the visit spent counseling patient, reviewing records , Labs and coordinating care for problems addressed at this encounter.

## 2018-05-01 DIAGNOSIS — M542 Cervicalgia: Secondary | ICD-10-CM | POA: Diagnosis not present

## 2018-05-01 DIAGNOSIS — M9902 Segmental and somatic dysfunction of thoracic region: Secondary | ICD-10-CM | POA: Diagnosis not present

## 2018-05-01 DIAGNOSIS — M9901 Segmental and somatic dysfunction of cervical region: Secondary | ICD-10-CM | POA: Diagnosis not present

## 2018-05-01 DIAGNOSIS — M546 Pain in thoracic spine: Secondary | ICD-10-CM | POA: Diagnosis not present

## 2018-05-08 ENCOUNTER — Non-Acute Institutional Stay (SKILLED_NURSING_FACILITY): Payer: Medicare Other | Admitting: Internal Medicine

## 2018-05-08 ENCOUNTER — Encounter: Payer: Self-pay | Admitting: Internal Medicine

## 2018-05-08 DIAGNOSIS — K649 Unspecified hemorrhoids: Secondary | ICD-10-CM | POA: Diagnosis not present

## 2018-05-08 DIAGNOSIS — I482 Chronic atrial fibrillation, unspecified: Secondary | ICD-10-CM

## 2018-05-08 DIAGNOSIS — I10 Essential (primary) hypertension: Secondary | ICD-10-CM

## 2018-05-08 DIAGNOSIS — H34811 Central retinal vein occlusion, right eye, with macular edema: Secondary | ICD-10-CM | POA: Diagnosis not present

## 2018-05-08 DIAGNOSIS — I5032 Chronic diastolic (congestive) heart failure: Secondary | ICD-10-CM | POA: Diagnosis not present

## 2018-05-08 DIAGNOSIS — H02103 Unspecified ectropion of right eye, unspecified eyelid: Secondary | ICD-10-CM | POA: Diagnosis not present

## 2018-05-08 NOTE — Progress Notes (Signed)
Location:   [facility] Room Number: 156/W Place of Service:  SNF [phone]) Provider:  Freddi Starr, MD  Patient Care Team: Virgie Dad, MD as PCP - General (Internal Medicine) Rolm Baptise as Physician Assistant (Internal Medicine)  Extended Emergency Contact Information Primary Emergency Contact: Adin Hector, [place] [address] [name] of [place] Phone: [phone] Relation: Son Secondary Emergency Contact: [name], [place] [address] [name] of [place] Phone: [phone] Mobile Phone: [phone] Relation: Son  Code Status:  DNR Goals of care: Advanced Directive information Advanced Directives 05/08/2018  Does Patient Have a Medical Advance Directive? Yes  Type of Advance Directive Out of facility DNR (pink MOST or yellow form)  Does patient want to make changes to medical advance directive? No - Patient declined  Copy of [place] in Chart? No - copy requested  Would patient like information on creating a medical advance directive? No - Patient declined  Pre-existing out of facility DNR order (yellow form or pink MOST form) -     Chief Complaint  Patient presents with  . Acute Visit    Patient being seen for hemorrhoids    HPI:  Pt is a 84 y.o. female seen today for an acute visit for follow-up of hemorrhoids. Patient has a history of atrial fibrillation as well as general VA as well as ICA stenosis diastolic CHF dilated pancreatic duct as well as hemorrhoids hyperlipidemia and hypertension as well as arthritis with chronic knee and back pain  She also has a history of chronic hemorrhoids she has been seen previously and thought not to be a good candidate for surgery bleeding risk with her being on Eliquis.  She is receiving Anusol twice a day as needed   She has developed a fairly large external hemorrhoid is complaining occasionally of some  discomfort with this staff has asked me to take a look at it       Past Medical History:  Diagnosis Date  . Atrial fibrillation ([place])   . Bradycardia   . Breast nodule 11/15/2014  . Breast pain, left 11/15/2014  . Carotid artery disease ([place])   . Dizziness   . Dyspnea    Previous CPX suggesting possible restrictive physiology, respiratory muscle fatigue, diastolic dysfunction  . Essential hypertension, benign   . Hyperlipidemia   . Oxygen dependent    2 liter  . Pacemaker [place]   . Rib pain on left side 11/15/2014  . Seasonal allergies   . Shingles 05/04/2015  . Stroke ([place])   . Toe fracture, left    left big toe   Past Surgical History:  Procedure Laterality Date  . COLONOSCOPY  02/22/2012   Procedure: COLONOSCOPY;  Surgeon: Rogene [facility], MD;  [facility];  Service: Endoscopy;  Laterality: N/A;  100  . KNEE SURGERY     bilateral  . PACEMAKER IMPLANT N/A 07/17/2017   Procedure: Pacemaker Implant;  Surgeon: Deboraha Sprang, MD;  Location: [facility];  Service: Cardiovascular;  Laterality: N/A;    Allergies  Allergen Reactions  . Sulfur Swelling and Rash    Outpatient Encounter Medications as of 05/08/2018  Medication Sig  . acetaminophen (TYLENOL) 500 MG tablet Take 1,000 mg by mouth 2 (two) times daily. Max of 3 grams acetaminophen per 24 hrs from all sources.  Marland Kitchen  amLODipine (NORVASC) 2.5 MG tablet Take 2.5 mg by mouth daily.  Jearl Klinefelter ELLIPTA 62.5-25 MCG/INH AEPB Inhale 1 Inhaler into the lungs daily.   Marland Kitchen apixaban (ELIQUIS) 2.5 MG TABS tablet Take 1 tablet (2.5 mg total) by mouth 2 (two) times daily. Resume 07/20/17  . atorvastatin (LIPITOR) 80 MG tablet Take 1 tablet (80 mg total) by mouth daily at 6 PM.  . cycloSPORINE (RESTASIS) 0.05 % ophthalmic emulsion Place 1 drop into both eyes every 12 (twelve) hours.   . diclofenac sodium (VOLTAREN) 1 % GEL Apply 1 application topically 4 (four) times daily as needed. Add to body where needed  . diltiazem  (CARDIZEM CD) 180 MG 24 hr capsule TAKE ONE CAPSULE BY MOUTH ONCE DAILY.  . furosemide (LASIX) 40 MG tablet Take 40 mg by mouth 2 (two) times daily.  Marland Kitchen gabapentin (NEURONTIN) 100 MG capsule Take 100 mg by mouth 2 (two) times daily.  . hydrocortisone (ANUSOL-HC) 2.5 % rectal cream Place 1 application rectally 2 (two) times daily as needed for hemorrhoids or anal itching.  . Melatonin 5 MG TABS Take 5 mg by mouth at bedtime as needed.  . Multiple Vitamin (MULITIVITAMIN WITH MINERALS) TABS Take 1 tablet by mouth every other day.   . OXYGEN Inhale 2 L into the lungs 2 (two) times daily. 3:15pm to 11:15pm 11:15pm to 07:15am  . pantoprazole (PROTONIX) 40 MG tablet Take 40 mg by mouth daily.  Vladimir Faster Glycol-Propyl Glycol (SYSTANE OP) Place 1 drop into both eyes 3 (three) times daily. For dry eyes  . polyethylene glycol (MIRALAX / GLYCOLAX) packet Take 17 g by mouth daily.  . potassium chloride (K-DUR,KLOR-CON) 10 MEQ tablet Take 20 mEq by mouth daily.  Marland Kitchen PROAIR HFA 108 (90 BASE) MCG/ACT inhaler Inhale 2 puffs into the lungs 3 (three) times daily as needed for wheezing (cough).   . traMADol (ULTRAM) 50 MG tablet Take 1 tablet (50 mg total) by mouth 3 (three) times daily.  . [DISCONTINUED] Albuterol (VENTOLIN IN) Inhale into the lungs.     No facility-administered encounter medications on file as of 05/08/2018.     Review of Systems   General she is not complaining of any fever or chills weight appears to be relatively stable   head ears eyes nose mouth and throat is not complaining of dyspnea on baseline lenses does not complain of sore throat.  Skin is not complaining of rashes or itching   Respiratory is not complaining of any shortness of breath or cough.   Cardiac is not complaining of chest pain has baseline lower extremity edema   GI is not complaining of abdominal pain nausea vomiting diarrhea constipation.  GU is not complaining of dysuria.  Rectal sayccasionally she will  have some rectal discomfort secondary to a hemorrhoid but does not describe this as acute pain  Muscular skeletal is not complaining of joint pain currently does have somewhat chronic complaints of knee pain-tramadol appears to help  neuro logic is not complaining of dizziness Headache or numbness   Psych-does not complain of being overtly anxious or depressed   .      Immunization History  Administered Date(s) Administered  . Influenza-Unspecified 10/10/2013, 09/11/2017  . Pneumococcal Conjugate-13 09/13/2017  . Tdap 09/16/2017  . Zoster 12/05/2017   Pertinent  Health Maintenance Due  Topic Date Due  . DEXA SCAN  06/08/2018 (Originally 01/15/1986)  . INFLUENZA VACCINE  07/10/2018  . PNA vac Low Risk Adult (2 of 2 - PPSV23) 09/13/2018  Fall Risk  09/02/2017  Falls in the past year? Yes  Number falls in past yr: 1  Injury with Fall? No   Functional Status Survey:    He is afebrile pulse of 62 respirations 20--blood pressure taken manually 170/64  Physical Exam   In general this is a pleasant elderly female in no distress sitting comfortably in her wheelchair  Skin is warm and dry.  Oropharynx is clear mucous membranes moist  . Chest--Clear to auscultation with somewhat shallow air entry there is no labored breathing  Abdomen is soft nontender with positive bowel sounds  Rectal she does have a medium to large size external hemorrhoid there is no bleeding mild tenderness to palpation of the area.  Muscle skeletal moves all extremities at baseline ambulates in wheelchair she does have arthritic changes of her knees bilaterally  Neurologic continues with expressive aphasia--neurologically appears to be at baseline moves extremities at baseline  sych continues to be largely alert and oriented pleasant and appropriate   Labs reviewed: Recent Labs    07/15/17 0805  02/06/18 0630 04/02/18 0920 04/29/18 0711  NA 134*   < > 139 140 140  K 5.5*   < > 3.9 4.0 4.2   CL 103   < > 101 101 101  CO2 19*   < > 26 26 28   GLUCOSE 94   < > 93 125* 93  BUN 53*   < > 30* 29* 27*  CREATININE 1.52*   < > 1.00 1.15* 1.04*  CALCIUM 8.7*   < > 9.0 9.1 8.8*  MG 2.3  --   --   --   --    < > = values in this interval not displayed.   Recent Labs    12/28/17 1040 04/02/18 0920 04/29/18 0711  AST 27 25 22   ALT 16 17 18   ALKPHOS 132* 144* 135*  BILITOT 1.0 1.2 0.8  PROT 7.1 7.2 6.9  ALBUMIN 3.7 3.8 3.8   Recent Labs    12/07/17 0930 02/06/18 0630 04/02/18 0920  WBC 5.7 6.6 8.7  NEUTROABS 3.1 3.7 5.8  HGB 12.0 13.6 13.4  HCT 38.6 43.4 42.8  MCV 88.7 87.3 89.2  PLT 180 219 208   Lab Results  Component Value Date   TSH 1.446 07/15/2017   Lab Results  Component Value Date   HGBA1C 5.5 12/06/2016   Lab Results  Component Value Date   CHOL 113 12/07/2017   HDL 56 12/07/2017   LDLCALC 48 12/07/2017   TRIG 47 12/07/2017   CHOLHDL 2.0 12/07/2017    Significant Diagnostic Results in last 30 days:  No results found.  Assessment/Plan  #1 history of hemorrhoids again she is not a good surgical candidate as noted above we will try symptomatic control encourage the Anusol will make this routine twice daily for now to see if this gives consistent relief.  2 I do not recently she was seen for diastolic CHF appears her weight is stable at around 143 edema appears to be at baseline she is on Lasix twice daily- metabolic panel showed stability when that was done back on 21 May-at this point will continue to monitor.  3 also there were concerns for renal function but this appears stable with a creatinine of 1.04 BUN of 27 as well this hasbeen relatively baseline with previous values   #4 hypertension-- continues on Lasix 40 mg twice daily she is also on Norvasc 2.5 mg a day--- as well as diltiazem  extended release 180 mg a day speaking with nursing systolic of 539 is not consistent she has just been outside facility and I suspect this is contributing to  the higher systolic readings- will monitor blood pressures here twice a day a log for review before making medication changes   5 history of atrial fibrillation she is on low-dose of Eliquis she is also on diltiazem for rate control this appears stable ---   #6- history of sick sinus syndrome status post pacemaker- this is followed by cardiology and has been stable as well   [id number]-of note greater than 25 minutes spent assessing patient- discussing her status with nursing staff reviewing her chart and labs-coordinating and formulating a plan of care-of note greater than 50% of time spent coordinating plan of care with input as noted above

## 2018-05-09 ENCOUNTER — Other Ambulatory Visit: Payer: Self-pay

## 2018-05-09 MED ORDER — TRAMADOL HCL 50 MG PO TABS: 50.0000 mg | ORAL_TABLET | Freq: Three times a day (TID) | ORAL | 0 refills | Status: DC

## 2018-05-09 NOTE — Telephone Encounter (Signed)
RX Fax for [facility]@ [phone]  

## 2018-06-02 ENCOUNTER — Encounter: Payer: Self-pay | Admitting: Internal Medicine

## 2018-06-02 ENCOUNTER — Non-Acute Institutional Stay (SKILLED_NURSING_FACILITY): Payer: Medicare Other | Admitting: Internal Medicine

## 2018-06-02 DIAGNOSIS — Z8669 Personal history of other diseases of the nervous system and sense organs: Secondary | ICD-10-CM | POA: Diagnosis not present

## 2018-06-02 DIAGNOSIS — I5032 Chronic diastolic (congestive) heart failure: Secondary | ICD-10-CM

## 2018-06-02 DIAGNOSIS — M1990 Unspecified osteoarthritis, unspecified site: Secondary | ICD-10-CM | POA: Diagnosis not present

## 2018-06-02 NOTE — Progress Notes (Signed)
Location:   [facility] Room Number: 156/W Place of Service:  SNF [id number]) Provider:  Freddi Starr, MD  Patient Care Team: Wendy Dad, MD as PCP - General (Internal Medicine) [facility] as Physician Assistant (Internal Medicine)  Extended Emergency Contact Information Primary Emergency Contact: Wendy [facility], [place] [address] Wendy Perkins of [place] Phone: [phone] Relation: Son Secondary Emergency Contact: Wendy [facility], [place] [address] [facility] of [place] Phone: [phone] Mobile Phone: [phone] Relation: Son  Code Status:  DNR Goals of care: Advanced Directive information Advanced Directives 06/02/2018  Does Patient Have a Medical Advance Directive? Yes  Type of Advance Directive Out of facility DNR (pink MOST or yellow form)  Does patient want to make changes to medical advance directive? No - Patient declined  Copy of [place] in Chart? No - copy requested  Would patient like information on creating a medical advance directive? No - Patient declined  Pre-existing out of facility DNR order (yellow form or pink MOST form) -     Chief Complaint  Patient presents with  . Acute Visit    Patient c/o Left Ear Pain    HPI:  Pt is a 84 y.o. female seen today for an acute visit for complaints of left ear pain  She does have a history of wax impaction in the past.  She does have hearing aids  Patient is a long-term resident of facility [facility] and has numerous diagnoses including atrial fibrillation as well as a history of CVA- carotid stenosis diastolic CHF- hyperlipidemia-as well as hypertension COPD and chronic arthritic pain  Her main complaint today is some discomfort of her left ear- per report from her and her roommate apparently she was seen by the audiologist and ears were cleaned out        Past Medical History:  Diagnosis Date  . Atrial  fibrillation ([place])   . Bradycardia   . Breast nodule 11/15/2014  . Breast pain, left 11/15/2014  . Carotid artery disease ([place])   . Dizziness   . Dyspnea    Previous CPX suggesting possible restrictive physiology, respiratory muscle fatigue, diastolic dysfunction  . Essential hypertension, benign   . Hyperlipidemia   . Oxygen dependent    2 liter  . Pacemaker [place]   . Rib pain on left side 11/15/2014  . Seasonal allergies   . Shingles 05/04/2015  . Stroke ([place])   . Toe fracture, left    left big toe   Past Surgical History:  Procedure Laterality Date  . COLONOSCOPY  02/22/2012   Procedure: COLONOSCOPY;  Surgeon: [facility], MD;  [facility];  Service: Endoscopy;  Laterality: N/A;  100  . KNEE SURGERY     bilateral  . PACEMAKER IMPLANT N/A 07/17/2017   Procedure: Pacemaker Implant;  Surgeon: Wendy Sprang, MD;  Location: [facility];  Service: Cardiovascular;  Laterality: N/A;    Allergies  Allergen Reactions  . Sulfur Swelling and Rash    Outpatient Encounter Medications as of 06/02/2018  Medication Sig  . acetaminophen (TYLENOL) 500 MG tablet Take 1,000 mg by mouth 2 (two) times daily. Max of 3 grams acetaminophen per 24 hrs from all sources.  [facility] amLODipine (NORVASC) 2.5 MG tablet Take 2.5 mg by mouth daily.  Jearl Klinefelter ELLIPTA 62.5-25 MCG/INH AEPB Inhale  1 Inhaler into the lungs daily.   [facility] apixaban (ELIQUIS) 2.5 MG TABS tablet Take 1 tablet (2.5 mg total) by mouth 2 (two) times daily. Resume 07/20/17  . artificial tears (LACRILUBE) OINT ophthalmic ointment Place 1 application into both eyes at bedtime.  [facility] atorvastatin (LIPITOR) 80 MG tablet Take 1 tablet (80 mg total) by mouth daily at 6 PM.  . cycloSPORINE (RESTASIS) 0.05 % ophthalmic emulsion Place 1 drop into both eyes every 12 (twelve) hours.   . diclofenac sodium (VOLTAREN) 1 % GEL Apply 1 application topically 4 (four) times daily as needed. Add to body where needed  . diltiazem (CARDIZEM CD) 180  MG 24 hr capsule TAKE ONE CAPSULE BY MOUTH ONCE DAILY.  . furosemide (LASIX) 40 MG tablet Take 40 mg by mouth 2 (two) times daily.  W[facility] gabapentin (NEURONTIN) 100 MG capsule Take 100 mg by mouth 2 (two) times daily.  . hydrocortisone (ANUSOL-HC) 2.5 % rectal cream Place 1 application rectally 2 (two) times daily as needed for hemorrhoids or anal itching.  . Melatonin 5 MG TABS Take 5 mg by mouth at bedtime as needed.  . Multiple Vitamin (MULITIVITAMIN WITH MINERALS) TABS Take 1 tablet by mouth every other day.   . OXYGEN Inhale 2 L into the lungs 2 (two) times daily. 3:15pm to 11:15pm 11:15pm to 07:15am  . pantoprazole (PROTONIX) 40 MG tablet Take 40 mg by mouth daily.  [facility] Glycol-Propyl Glycol (SYSTANE OP) Place 1 drop into both eyes 3 (three) times daily. For dry eyes  . polyethylene glycol (MIRALAX / GLYCOLAX) packet Take 17 g by mouth daily.  . potassium chloride (K-DUR,KLOR-CON) 10 MEQ tablet Take 20 mEq by mouth daily.  W[facility] PROAIR HFA 108 (90 BASE) MCG/ACT inhaler Inhale 2 puffs into the lungs 3 (three) times daily as needed for wheezing (cough).   . [facility] (ULTRAM) 50 MG tablet Take 1 tablet (50 mg total) by mouth 3 (three) times daily.  . [DISCONTINUED] Albuterol (VENTOLIN IN) Inhale into the lungs.     No facility-administered encounter medications on file as of 06/02/2018.     Review of Systems   In general she is not complaining of fever chills her weight appears to be stable  Skin does not complain of rashes or itching.  Head ears eyes nose mouth and throat- sore throat or visual changes -- has prescription lenses Does complain of some left ear discomfort appears to be more outside of ear  She does complain of some left ear discomfort this actually appears to be more outside the actual ear canal  Respiratory is not complaining of shortness of breath or cough beyond baseline  Cardiac does not complain of chest pain appears to have relatively baseline lower extremity  edema  GI is not complaining of abdominal pain nausea vomiting diarrhea or constipation-she does have a history of hemorrhoid pain at times   GU-is not complaining of dysuria  Musculoskeletal has somewhat diffuse joint complaints is more so her knee especially on the right--this appears to be chronic  Neurologic is not complaining of dizziness headache or numbness   Psych does not complain of being overtly anxious or depressed Immunization History  Administered Date(s) Administered  . Influenza-Unspecified 10/10/2013, 09/11/2017  . Pneumococcal Conjugate-13 09/13/2017  . Tdap 09/16/2017  . Zoster 12/05/2017   Pertinent  Health Maintenance Due  Topic Date Due  . DEXA SCAN  06/08/2018 (Originally 01/15/1986)  . INFLUENZA VACCINE  07/10/2018  . PNA vac Low Risk Adult (2  of 2 - PPSV23) 09/13/2018   Fall Risk  09/02/2017  Falls in the past year? Yes  Number falls in past yr: 1  Injury with Fall? No   Functional Status Survey:    Vitals:   06/02/18 1430  BP: 121/63  Pulse: 62  Resp: 17  Temp: (!) 97.5 F (36.4 C)  TempSrc: Oral   Weight is stable at 142 pounds Physical Exam   In general this is a pleasant female in no distress sitting comfortably in her chair  Skin is warm and dry.    Eyes Sclera and conjunctive are clear she has prescription lenses.  Oropharynx is clear mucous membranes moist  Ears- tympanic   membrane was difficult to visualize bilaterally because of wax I do not see any erythema or drainage  Outside of the left ear canal- some slight tenderness to palpation but cannot really appreciate any drainage or bleeding or edema or erythema along the outer ear  Chest is clear to auscultation there is no labored breathing  Heart is regular rate and rhythm Solik murmur-she has baseline lower extremity edema bit more on the left versus the right--pedal pulses are somewhat reduced because of the edema   Abdomen is soft nontender with positive bowel  sounds  Musculoskeletal he is ambulatory in wheelchair has baseline arthritic changes most prominent of her knees bilaterally- there is edema of her knees at his baseline a bit more on the right versus the left cool not erythematous not acutely tender  Neurologic is grossly intact she does have expressive aphasia but is communicative and has a delayed speech pattern which is Baseline-  Psych she is largely alert and oriented continues to be pleasant and appropriate -- Labs reviewed: Recent Labs    07/15/17 0805  02/06/18 0630 04/02/18 0920 04/29/18 0711  NA 134*   < > 139 140 140  K 5.5*   < > 3.9 4.0 4.2  CL 103   < > 101 101 101  CO2 19*   < > 26 26 28   GLUCOSE 94   < > 93 125* 93  BUN 53*   < > 30* 29* 27*  CREATININE 1.52*   < > 1.00 1.15* 1.04*  CALCIUM 8.7*   < > 9.0 9.1 8.8*  MG 2.3  --   --   --   --    < > = values in this interval not displayed.   Recent Labs    12/28/17 1040 04/02/18 0920 04/29/18 0711  AST 27 25 22   ALT 16 17 18   ALKPHOS 132* 144* 135*  BILITOT 1.0 1.2 0.8  PROT 7.1 7.2 6.9  ALBUMIN 3.7 3.8 3.8   Recent Labs    12/07/17 0930 02/06/18 0630 04/02/18 0920  WBC 5.7 6.6 8.7  NEUTROABS 3.1 3.7 5.8  HGB 12.0 13.6 13.4  HCT 38.6 43.4 42.8  MCV 88.7 87.3 89.2  PLT 180 219 208   Lab Results  Component Value Date   TSH 1.446 07/15/2017   Lab Results  Component Value Date   HGBA1C 5.5 12/06/2016   Lab Results  Component Value Date   CHOL 113 12/07/2017   HDL 56 12/07/2017   LDLCALC 48 12/07/2017   TRIG 47 12/07/2017   CHOLHDL 2.0 12/07/2017    Significant Diagnostic Results in last 30 days:  No results found.  Assessment/Plan  #1- suspected earwax impaction-we will treat with Debrox at this time twice daily and flush on day 4 monitor--if no resolution  consider ENT consult  Could not see signs of infection but there was significant wax buildup bilaterally   #2 CHF-weight appears to be stable this appears baseline continues  on Lasix 40 mg twice daily  #3 osteoarthritic pain-this is diffuse she is on [facility] 3 times a day as well as Voltaren gel to various body parts 4 times daily-at this point will monitor   [phone]- of note greater than 25 minutes spent assessing patient- reviewing her chart and labs- coordinating and formulating a plan of care for numerous diagnoses-of note greater than 50% of time spent coordinating plan of care

## 2018-06-09 DIAGNOSIS — I69320 Aphasia following cerebral infarction: Secondary | ICD-10-CM | POA: Diagnosis not present

## 2018-06-09 DIAGNOSIS — Z5189 Encounter for other specified aftercare: Secondary | ICD-10-CM | POA: Diagnosis not present

## 2018-06-10 DIAGNOSIS — I69320 Aphasia following cerebral infarction: Secondary | ICD-10-CM | POA: Diagnosis not present

## 2018-06-10 DIAGNOSIS — Z5189 Encounter for other specified aftercare: Secondary | ICD-10-CM | POA: Diagnosis not present

## 2018-06-11 DIAGNOSIS — Z5189 Encounter for other specified aftercare: Secondary | ICD-10-CM | POA: Diagnosis not present

## 2018-06-11 DIAGNOSIS — I69320 Aphasia following cerebral infarction: Secondary | ICD-10-CM | POA: Diagnosis not present

## 2018-06-12 DIAGNOSIS — I69320 Aphasia following cerebral infarction: Secondary | ICD-10-CM | POA: Diagnosis not present

## 2018-06-12 DIAGNOSIS — Z5189 Encounter for other specified aftercare: Secondary | ICD-10-CM | POA: Diagnosis not present

## 2018-06-13 DIAGNOSIS — Z5189 Encounter for other specified aftercare: Secondary | ICD-10-CM | POA: Diagnosis not present

## 2018-06-13 DIAGNOSIS — I69320 Aphasia following cerebral infarction: Secondary | ICD-10-CM | POA: Diagnosis not present

## 2018-06-17 ENCOUNTER — Encounter: Payer: Self-pay | Admitting: Internal Medicine

## 2018-06-17 ENCOUNTER — Non-Acute Institutional Stay (SKILLED_NURSING_FACILITY): Payer: Medicare Other | Admitting: Internal Medicine

## 2018-06-17 DIAGNOSIS — H9202 Otalgia, left ear: Secondary | ICD-10-CM | POA: Diagnosis not present

## 2018-06-17 DIAGNOSIS — Z5189 Encounter for other specified aftercare: Secondary | ICD-10-CM | POA: Diagnosis not present

## 2018-06-17 DIAGNOSIS — I482 Chronic atrial fibrillation, unspecified: Secondary | ICD-10-CM

## 2018-06-17 DIAGNOSIS — I5032 Chronic diastolic (congestive) heart failure: Secondary | ICD-10-CM

## 2018-06-17 DIAGNOSIS — I10 Essential (primary) hypertension: Secondary | ICD-10-CM | POA: Diagnosis not present

## 2018-06-17 DIAGNOSIS — I69320 Aphasia following cerebral infarction: Secondary | ICD-10-CM | POA: Diagnosis not present

## 2018-06-17 DIAGNOSIS — J449 Chronic obstructive pulmonary disease, unspecified: Secondary | ICD-10-CM

## 2018-06-17 NOTE — Progress Notes (Signed)
Location:   [facility] Room Number: 156/W Place of Service:  SNF [phone]) Provider:  Freddi Starr, MD  Patient Care Team: Virgie Dad, MD as PCP - General (Internal Medicine) Rolm Baptise as Physician Assistant (Internal Medicine)  Extended Emergency Contact Information Primary Emergency Contact: Adin Hector, [place] [address] [name] of [place] Phone: [phone] Relation: Son Secondary Emergency Contact: [name], [place] [address] [name] of [place] Phone: [phone] Mobile Phone: [phone] Relation: Son  Code Status:  DNR Goals of care: Advanced Directive information Advanced Directives 06/17/2018  Does Patient Have a Medical Advance Directive? Yes  Type of Advance Directive Out of facility DNR (pink MOST or yellow form)  Does patient want to make changes to medical advance directive? No - Patient declined  Copy of [place] in Chart? No - copy requested  Would patient like information on creating a medical advance directive? No - Patient declined  Pre-existing out of facility DNR order (yellow form or pink MOST form) -     Chief Complaint  Patient presents with  . Acute Visit    Patients c/o Left Ear Pain    HPI:  Pt is a 84 y.o. female seen today for an acute visit for complaints of what appears to be intermittentr left ear  discomfort that radiates down apparently inferiorly to the neck  I saw her approximately 3 weeks ago for complaints of some left ear discomfort thought to have a significant amount of wax and Debrox was ordered  Today I was able to visualize the tympanic membrane on the left there was still some wax-right ear appear to have more of a significant wax impaction-I did not see any drainage bleeding or erythema in either ear  Way she describes the pain is is intermittent and sometimes occurs apparently when she moves her neck  which would lead one to think possibly this may be more of a musculoskeletal spasm situation  She is currently not really complaining of discomfort describes it when it occurs is somewhat of a sharp pain  Her main complaint today is the left ear-otherwise does not really have any acute complaints is not complaining of any shortness of breath or breathing difficulties.  She is a long-term resident of facility with a history of atrial fibrillation on Eliquis and diltiazem-as well as diastolic CHF is on Lasix with potassium supplementation.  She also has a history CVA with expressive aphasia but does well with this- in addition to a history of a pacemaker followed by cardiology with a history of sick sinus syndrome.  She also has diffuse osteoarthritic pain continues on routine tramadol and does have orders for Voltaren gel frequently as well   In regards to diastolic CHF She does continue on Lasix 40 mg twice daily her weight appears to be stable in the mid 140s  Regards atrial fibrillation this appears to be rate controlled on the diltiazem she is on low-dose Eliquis for anticoagulation  Manual blood pressure today was 160/60 according to nursing she does occasionally have a systolic spikes but not consistent will order these more frequently to keep an eye on it--- in addition to the diltiazem she is on low-dose Norvasc 2.5 mg a day       Past Medical History:  Diagnosis Date  . Atrial fibrillation ([facility])   .  Bradycardia   . Breast nodule 11/15/2014  . Breast pain, left 11/15/2014  . Carotid artery disease ([place])   . Dizziness   . Dyspnea    Previous CPX suggesting possible restrictive physiology, respiratory muscle fatigue, diastolic dysfunction  . Essential hypertension, benign   . Hyperlipidemia   . Oxygen dependent    2 liter  . Pacemaker [place]   . Rib pain on left side 11/15/2014  . Seasonal allergies   . Shingles 05/04/2015  . Stroke ([facility])   . Toe fracture, left    left  big toe   Past Surgical History:  Procedure Laterality Date  . COLONOSCOPY  02/22/2012   Procedure: COLONOSCOPY;  Surgeon: Rogene Houston, MD;  [facility];  Service: Endoscopy;  Laterality: N/A;  100  . KNEE SURGERY     bilateral  . PACEMAKER IMPLANT N/A 07/17/2017   Procedure: Pacemaker Implant;  Surgeon: Deboraha Sprang, MD;  Location: [facility];  Service: Cardiovascular;  Laterality: N/A;    Allergies  Allergen Reactions  . Sulfur Swelling and Rash    Outpatient Encounter Medications as of 06/17/2018  Medication Sig  . acetaminophen (TYLENOL) 500 MG tablet Take 1,000 mg by mouth 2 (two) times daily. Max of 3 grams acetaminophen per 24 hrs from all sources.  Marland Kitchen amLODipine (NORVASC) 2.5 MG tablet Take 2.5 mg by mouth daily.  Jearl Klinefelter ELLIPTA 62.5-25 MCG/INH AEPB Inhale 1 Inhaler into the lungs daily.   Marland Kitchen apixaban (ELIQUIS) 2.5 MG TABS tablet Take 1 tablet (2.5 mg total) by mouth 2 (two) times daily. Resume 07/20/17  . artificial tears (LACRILUBE) OINT ophthalmic ointment Place 1 application into both eyes at bedtime.  Marland Kitchen atorvastatin (LIPITOR) 80 MG tablet Take 1 tablet (80 mg total) by mouth daily at 6 PM.  . cycloSPORINE (RESTASIS) 0.05 % ophthalmic emulsion Place 1 drop into both eyes every 12 (twelve) hours.   . diclofenac sodium (VOLTAREN) 1 % GEL Apply 1 application topically 4 (four) times daily as needed. Add to body where needed  . diltiazem (CARDIZEM CD) 180 MG 24 hr capsule TAKE ONE CAPSULE BY MOUTH ONCE DAILY.  . furosemide (LASIX) 40 MG tablet Take 40 mg by mouth 2 (two) times daily.  Marland Kitchen gabapentin (NEURONTIN) 100 MG capsule Take 100 mg by mouth 2 (two) times daily.  . hydrocortisone (ANUSOL-HC) 2.5 % rectal cream Place 1 application rectally 2 (two) times daily as needed for hemorrhoids or anal itching.  . Melatonin 5 MG TABS Take 5 mg by mouth at bedtime as needed.  . Multiple Vitamin (MULITIVITAMIN WITH MINERALS) TABS Take 1 tablet by mouth every other  day.   . OXYGEN Inhale 2 L into the lungs 2 (two) times daily. 3:15pm to 11:15pm 11:15pm to 07:15am  . pantoprazole (PROTONIX) 40 MG tablet Take 40 mg by mouth daily.  Vladimir Faster Glycol-Propyl Glycol (SYSTANE OP) Place 1 drop into both eyes 3 (three) times daily. For dry eyes  . polyethylene glycol (MIRALAX / GLYCOLAX) packet Take 17 g by mouth daily.  . potassium chloride (K-DUR,KLOR-CON) 10 MEQ tablet Take 20 mEq by mouth daily.  Marland Kitchen PROAIR HFA 108 (90 BASE) MCG/ACT inhaler Inhale 2 puffs into the lungs 3 (three) times daily as needed for wheezing (cough).   . traMADol (ULTRAM) 50 MG tablet Take 1 tablet (50 mg total) by mouth 3 (three) times daily.  . [DISCONTINUED] Albuterol (VENTOLIN IN) Inhale into the lungs.     No facility-administered encounter  medications on file as of 06/17/2018.     Review of Systems   General she not complain of any fever chills her weight appears to be stable at 144.8 pound  Skin does not complain of rashes or itching.  Head ears eyes nose mouth and throat she has prescription lenses has a history of retinal vein occlusion.  She does complain of what appears to be some pain to her left ear that radiates inferiorly  Respiratory--is not complaining of increased shortness of breath or cough   Cardiac -is not complaining of chest pain has chronic lower extremity edema which appears relatively baseline  GI does not complain currently of abdominal pain nausea vomiting diarrhea constipation.  Rectal does have a history of hemorrhoids but is not really complaining of pain today.  Mus skeletal has diffuse arthritic discomfort especially of her knees at times but does not really complain of that today  Neurologic is not complaining of dizziness headache or numbness at this time  Psych does not complain of being overtly anxious or depressed appears to be resting comfortably in bed    Immunization History  Administered Date(s) Administered  .  Influenza-Unspecified 10/10/2013, 09/11/2017  . Pneumococcal Conjugate-13 09/13/2017  . Tdap 09/16/2017  . Zoster 12/05/2017   Pertinent  Health Maintenance Due  Topic Date Due  . DEXA SCAN  07/18/2018 (Originally 01/15/1986)  . INFLUENZA VACCINE  07/10/2018  . PNA vac Low Risk Adult (2 of 2 - PPSV23) 09/13/2018   Fall Risk  09/02/2017  Falls in the past year? Yes  Number falls in past yr: 1  Injury with Fall? No   Functional Status Survey:    She is afebrile pulse of 62-respirations of 18-blood pressure taken manually 160/60- weight is 144.8 Physical Exam   In general this is a pleasant elderly female in no distress resting comfortably in bed.  Skin is warm and dry.  Eyes visual acuity appears to be grossly intact sclera and conjunctive are clear- she has prescription lenses.  Ears-tympanic membrane is visualized left ear there is a moderate amount of wax-I do not note any drainage bleeding or erythema.  Right ear was difficult to visualize tympanic membrane because of wax but I do not see any drainage erythema or discharge  Left ear external I cannot really appreciate any swelling or erythema-possibly some mild tenderness to palpation inferior to ear but this did not appear to be acute tenderness  Oropharynx is clear mucous membranes moist.  Chest is clear to auscultation there is no labored breathing.  Heart is regular rate and rhythm with an infrequent irregular beat- slight systolic murmur- appears to have baseline lower extremity edema.  Her abdomen is soft nontender with positive bowel sounds.  Mus-skeletal--moves her extremities at baseline limited exam since she is in bed- does have arthritic changes of her knees bilaterally  Neurologic she does have expressive aphasia at baseline moves all her extremities at baseline.  Psych she i appears largely alert and oriented pleasant and appropriate Labs reviewed: Recent Labs    07/15/17 0805  02/06/18 0630  04/02/18 0920 04/29/18 0711  NA 134*   < > 139 140 140  K 5.5*   < > 3.9 4.0 4.2  CL 103   < > 101 101 101  CO2 19*   < > 26 26 28   GLUCOSE 94   < > 93 125* 93  BUN 53*   < > 30* 29* 27*  CREATININE 1.52*   < >  1.00 1.15* 1.04*  CALCIUM 8.7*   < > 9.0 9.1 8.8*  MG 2.3  --   --   --   --    < > = values in this interval not displayed.   Recent Labs    12/28/17 1040 04/02/18 0920 04/29/18 0711  AST 27 25 22   ALT 16 17 18   ALKPHOS 132* 144* 135*  BILITOT 1.0 1.2 0.8  PROT 7.1 7.2 6.9  ALBUMIN 3.7 3.8 3.8   Recent Labs    12/07/17 0930 02/06/18 0630 04/02/18 0920  WBC 5.7 6.6 8.7  NEUTROABS 3.1 3.7 5.8  HGB 12.0 13.6 13.4  HCT 38.6 43.4 42.8  MCV 88.7 87.3 89.2  PLT 180 219 208   Lab Results  Component Value Date   TSH 1.446 07/15/2017   Lab Results  Component Value Date   HGBA1C 5.5 12/06/2016   Lab Results  Component Value Date   CHOL 113 12/07/2017   HDL 56 12/07/2017   LDLCALC 48 12/07/2017   TRIG 47 12/07/2017   CHOLHDL 2.0 12/07/2017    Significant Diagnostic Results in last 30 days:  No results found.  Assessment/Plan  #1 left ear discomfort?-  Ear exam appeared to be fairly benign she still has some wax in both ears but do not think this would be causing pain  Did not really see any evidence of erythema or drainage inferior to the ear  May be more of a muscle situations she describes this as a sharp pain of short duration--- she states she does not really want any additional medication t like a muscle relaxer-and I would be hesitant to be real aggressive here secondary to her advanced age  Will order a CBC with differential tomorrow to look for any elevated white count or abnormalitiesr- also will order another round of Debrox bilaterally--monitor for any changes  #2 history of diastolic CHF her weight appears to be stable clinically appears to be stable in this regard she is not complaining of any increased shortness of breath-will update a  metabolic panel to ensure stability of renal function- she is on Lasix with potassium.  3.  History of atrial fibrillation this appears rate controlled on diltiazem she is on Eliquis for anticoagulation.  4.  History of's CVA- this appears stable on low-dose Eliquis--- aspirin has been held because of GI bleed and Eliquis is low dose.  She is also on a statin LDL was 48 in December.  5.-History of COPD she is on anAnoro [employer] as needed- this appears to be stable currently  #6-history of diffuse osteoarthritic pain continues on tramadol 3 times a day and Voltaren gel as needed 4 times a day as well--this appears to be helping we will monitor  #7 history of hypertension she is on Norvasc 2.5 mg a day and diltiazem 180 mg a day- will order blood pressure checks here twice a day to see how consistently her systolic elevated- does not look like these blood pressures have been consistently elevated will monitor  Again will update a CBC with differential and a metabolic panel-and keep an eye on ear issues-will order Debrox--if--ear discomfort persist consider ENT consult  [phone]

## 2018-06-18 ENCOUNTER — Encounter (HOSPITAL_COMMUNITY)
Admission: RE | Admit: 2018-06-18 | Discharge: 2018-06-18 | Disposition: A | Discharge status: Home / Self Care | Payer: Medicare Other | Source: Skilled Nursing Facility | Attending: Internal Medicine | Admitting: Internal Medicine

## 2018-06-18 DIAGNOSIS — I482 Chronic atrial fibrillation: Secondary | ICD-10-CM | POA: Diagnosis not present

## 2018-06-18 DIAGNOSIS — M6281 Muscle weakness (generalized): Secondary | ICD-10-CM | POA: Insufficient documentation

## 2018-06-18 DIAGNOSIS — I69320 Aphasia following cerebral infarction: Secondary | ICD-10-CM | POA: Diagnosis not present

## 2018-06-18 DIAGNOSIS — I442 Atrioventricular block, complete: Secondary | ICD-10-CM | POA: Diagnosis not present

## 2018-06-18 DIAGNOSIS — R279 Unspecified lack of coordination: Secondary | ICD-10-CM | POA: Diagnosis not present

## 2018-06-18 DIAGNOSIS — Z5189 Encounter for other specified aftercare: Secondary | ICD-10-CM | POA: Diagnosis not present

## 2018-06-18 DIAGNOSIS — I63312 Cerebral infarction due to thrombosis of left middle cerebral artery: Secondary | ICD-10-CM | POA: Diagnosis not present

## 2018-06-18 DIAGNOSIS — M25512 Pain in left shoulder: Secondary | ICD-10-CM | POA: Diagnosis not present

## 2018-06-18 LAB — CBC WITH DIFFERENTIAL/PLATELET
BASOS ABS: 0 10*3/uL (ref 0.0–0.1)
BASOS PCT: 0 %
EOS ABS: 0.1 10*3/uL (ref 0.0–0.7)
Eosinophils Relative: 2 %
HCT: 38.1 % (ref 36.0–46.0)
HEMOGLOBIN: 12.2 g/dL (ref 12.0–15.0)
Lymphocytes Relative: 31 %
Lymphs Abs: 1.9 10*3/uL (ref 0.7–4.0)
MCH: 28.2 pg (ref 26.0–34.0)
MCHC: 32 g/dL (ref 30.0–36.0)
MCV: 88 fL (ref 78.0–100.0)
Monocytes Absolute: 0.6 10*3/uL (ref 0.1–1.0)
Monocytes Relative: 10 %
NEUTROS PCT: 57 %
Neutro Abs: 3.3 10*3/uL (ref 1.7–7.7)
Platelets: 200 10*3/uL (ref 150–400)
RBC: 4.33 MIL/uL (ref 3.87–5.11)
RDW: 14.2 % (ref 11.5–15.5)
WBC: 6 10*3/uL (ref 4.0–10.5)

## 2018-06-18 LAB — BASIC METABOLIC PANEL
ANION GAP: 8 (ref 5–15)
BUN: 28 mg/dL — ABNORMAL HIGH (ref 8–23)
CHLORIDE: 106 mmol/L (ref 98–111)
CO2: 28 mmol/L (ref 22–32)
CREATININE: 1 mg/dL (ref 0.44–1.00)
Calcium: 8.6 mg/dL — ABNORMAL LOW (ref 8.9–10.3)
GFR calc non Af Amer: 46 mL/min — ABNORMAL LOW (ref >60)
GFR, EST AFRICAN AMERICAN: 53 mL/min — AB (ref >60)
Glucose, Bld: 88 mg/dL (ref 70–99)
Potassium: 4.1 mmol/L (ref 3.5–5.1)
Sodium: 142 mmol/L (ref 135–145)

## 2018-06-19 DIAGNOSIS — Z5189 Encounter for other specified aftercare: Secondary | ICD-10-CM | POA: Diagnosis not present

## 2018-06-19 DIAGNOSIS — I69320 Aphasia following cerebral infarction: Secondary | ICD-10-CM | POA: Diagnosis not present

## 2018-06-20 DIAGNOSIS — Z5189 Encounter for other specified aftercare: Secondary | ICD-10-CM | POA: Diagnosis not present

## 2018-06-20 DIAGNOSIS — I69320 Aphasia following cerebral infarction: Secondary | ICD-10-CM | POA: Diagnosis not present

## 2018-06-23 DIAGNOSIS — I69320 Aphasia following cerebral infarction: Secondary | ICD-10-CM | POA: Diagnosis not present

## 2018-06-23 DIAGNOSIS — I739 Peripheral vascular disease, unspecified: Secondary | ICD-10-CM | POA: Diagnosis not present

## 2018-06-23 DIAGNOSIS — B351 Tinea unguium: Secondary | ICD-10-CM | POA: Diagnosis not present

## 2018-06-23 DIAGNOSIS — Z5189 Encounter for other specified aftercare: Secondary | ICD-10-CM | POA: Diagnosis not present

## 2018-06-23 DIAGNOSIS — L603 Nail dystrophy: Secondary | ICD-10-CM | POA: Diagnosis not present

## 2018-06-24 DIAGNOSIS — Z5189 Encounter for other specified aftercare: Secondary | ICD-10-CM | POA: Diagnosis not present

## 2018-06-24 DIAGNOSIS — I69320 Aphasia following cerebral infarction: Secondary | ICD-10-CM | POA: Diagnosis not present

## 2018-06-25 DIAGNOSIS — Z5189 Encounter for other specified aftercare: Secondary | ICD-10-CM | POA: Diagnosis not present

## 2018-06-25 DIAGNOSIS — I69320 Aphasia following cerebral infarction: Secondary | ICD-10-CM | POA: Diagnosis not present

## 2018-06-26 DIAGNOSIS — M542 Cervicalgia: Secondary | ICD-10-CM | POA: Diagnosis not present

## 2018-06-26 DIAGNOSIS — M9902 Segmental and somatic dysfunction of thoracic region: Secondary | ICD-10-CM | POA: Diagnosis not present

## 2018-06-26 DIAGNOSIS — M9901 Segmental and somatic dysfunction of cervical region: Secondary | ICD-10-CM | POA: Diagnosis not present

## 2018-06-26 DIAGNOSIS — M546 Pain in thoracic spine: Secondary | ICD-10-CM | POA: Diagnosis not present

## 2018-06-27 DIAGNOSIS — I69320 Aphasia following cerebral infarction: Secondary | ICD-10-CM | POA: Diagnosis not present

## 2018-06-27 DIAGNOSIS — Z5189 Encounter for other specified aftercare: Secondary | ICD-10-CM | POA: Diagnosis not present

## 2018-06-30 DIAGNOSIS — I69320 Aphasia following cerebral infarction: Secondary | ICD-10-CM | POA: Diagnosis not present

## 2018-06-30 DIAGNOSIS — Z5189 Encounter for other specified aftercare: Secondary | ICD-10-CM | POA: Diagnosis not present

## 2018-07-01 DIAGNOSIS — Z5189 Encounter for other specified aftercare: Secondary | ICD-10-CM | POA: Diagnosis not present

## 2018-07-01 DIAGNOSIS — I69320 Aphasia following cerebral infarction: Secondary | ICD-10-CM | POA: Diagnosis not present

## 2018-07-02 DIAGNOSIS — Z5189 Encounter for other specified aftercare: Secondary | ICD-10-CM | POA: Diagnosis not present

## 2018-07-02 DIAGNOSIS — I69320 Aphasia following cerebral infarction: Secondary | ICD-10-CM | POA: Diagnosis not present

## 2018-07-03 DIAGNOSIS — I69320 Aphasia following cerebral infarction: Secondary | ICD-10-CM | POA: Diagnosis not present

## 2018-07-03 DIAGNOSIS — Z5189 Encounter for other specified aftercare: Secondary | ICD-10-CM | POA: Diagnosis not present

## 2018-07-04 DIAGNOSIS — I69320 Aphasia following cerebral infarction: Secondary | ICD-10-CM | POA: Diagnosis not present

## 2018-07-04 DIAGNOSIS — Z5189 Encounter for other specified aftercare: Secondary | ICD-10-CM | POA: Diagnosis not present

## 2018-07-07 DIAGNOSIS — I69320 Aphasia following cerebral infarction: Secondary | ICD-10-CM | POA: Diagnosis not present

## 2018-07-07 DIAGNOSIS — Z5189 Encounter for other specified aftercare: Secondary | ICD-10-CM | POA: Diagnosis not present

## 2018-07-08 DIAGNOSIS — I69320 Aphasia following cerebral infarction: Secondary | ICD-10-CM | POA: Diagnosis not present

## 2018-07-08 DIAGNOSIS — Z5189 Encounter for other specified aftercare: Secondary | ICD-10-CM | POA: Diagnosis not present

## 2018-07-09 ENCOUNTER — Other Ambulatory Visit: Payer: Self-pay

## 2018-07-09 MED ORDER — TRAMADOL HCL 50 MG PO TABS: 50.0000 mg | ORAL_TABLET | Freq: Three times a day (TID) | ORAL | 0 refills | Status: DC

## 2018-07-09 NOTE — Telephone Encounter (Signed)
RX Fax for [facility]@ [phone]  

## 2018-07-10 DIAGNOSIS — I69320 Aphasia following cerebral infarction: Secondary | ICD-10-CM | POA: Diagnosis not present

## 2018-07-10 DIAGNOSIS — Z5189 Encounter for other specified aftercare: Secondary | ICD-10-CM | POA: Diagnosis not present

## 2018-07-11 DIAGNOSIS — Z5189 Encounter for other specified aftercare: Secondary | ICD-10-CM | POA: Diagnosis not present

## 2018-07-11 DIAGNOSIS — I69320 Aphasia following cerebral infarction: Secondary | ICD-10-CM | POA: Diagnosis not present

## 2018-07-14 DIAGNOSIS — Z5189 Encounter for other specified aftercare: Secondary | ICD-10-CM | POA: Diagnosis not present

## 2018-07-14 DIAGNOSIS — I69320 Aphasia following cerebral infarction: Secondary | ICD-10-CM | POA: Diagnosis not present

## 2018-07-15 DIAGNOSIS — I69320 Aphasia following cerebral infarction: Secondary | ICD-10-CM | POA: Diagnosis not present

## 2018-07-15 DIAGNOSIS — Z5189 Encounter for other specified aftercare: Secondary | ICD-10-CM | POA: Diagnosis not present

## 2018-07-16 DIAGNOSIS — Z5189 Encounter for other specified aftercare: Secondary | ICD-10-CM | POA: Diagnosis not present

## 2018-07-16 DIAGNOSIS — I69320 Aphasia following cerebral infarction: Secondary | ICD-10-CM | POA: Diagnosis not present

## 2018-07-17 DIAGNOSIS — I69320 Aphasia following cerebral infarction: Secondary | ICD-10-CM | POA: Diagnosis not present

## 2018-07-17 DIAGNOSIS — Z5189 Encounter for other specified aftercare: Secondary | ICD-10-CM | POA: Diagnosis not present

## 2018-07-18 DIAGNOSIS — I69320 Aphasia following cerebral infarction: Secondary | ICD-10-CM | POA: Diagnosis not present

## 2018-07-18 DIAGNOSIS — Z5189 Encounter for other specified aftercare: Secondary | ICD-10-CM | POA: Diagnosis not present

## 2018-07-21 DIAGNOSIS — I69320 Aphasia following cerebral infarction: Secondary | ICD-10-CM | POA: Diagnosis not present

## 2018-07-21 DIAGNOSIS — Z5189 Encounter for other specified aftercare: Secondary | ICD-10-CM | POA: Diagnosis not present

## 2018-07-22 ENCOUNTER — Ambulatory Visit (INDEPENDENT_AMBULATORY_CARE_PROVIDER_SITE_OTHER): Payer: Medicare Other | Admitting: *Deleted

## 2018-07-22 DIAGNOSIS — I442 Atrioventricular block, complete: Secondary | ICD-10-CM

## 2018-07-22 DIAGNOSIS — I69320 Aphasia following cerebral infarction: Secondary | ICD-10-CM | POA: Diagnosis not present

## 2018-07-22 DIAGNOSIS — Z5189 Encounter for other specified aftercare: Secondary | ICD-10-CM | POA: Diagnosis not present

## 2018-07-22 NOTE — Progress Notes (Signed)
Remote pacemaker transmission.   

## 2018-07-23 DIAGNOSIS — I69320 Aphasia following cerebral infarction: Secondary | ICD-10-CM | POA: Diagnosis not present

## 2018-07-23 DIAGNOSIS — Z5189 Encounter for other specified aftercare: Secondary | ICD-10-CM | POA: Diagnosis not present

## 2018-07-24 DIAGNOSIS — Z5189 Encounter for other specified aftercare: Secondary | ICD-10-CM | POA: Diagnosis not present

## 2018-07-24 DIAGNOSIS — I69320 Aphasia following cerebral infarction: Secondary | ICD-10-CM | POA: Diagnosis not present

## 2018-07-25 DIAGNOSIS — Z5189 Encounter for other specified aftercare: Secondary | ICD-10-CM | POA: Diagnosis not present

## 2018-07-25 DIAGNOSIS — I69320 Aphasia following cerebral infarction: Secondary | ICD-10-CM | POA: Diagnosis not present

## 2018-07-28 ENCOUNTER — Ambulatory Visit (INDEPENDENT_AMBULATORY_CARE_PROVIDER_SITE_OTHER): Payer: Medicare Other | Admitting: Internal Medicine

## 2018-07-28 ENCOUNTER — Encounter: Payer: Self-pay | Admitting: Internal Medicine

## 2018-07-28 VITALS — BP 135/63 | HR 60 | Ht 64.0 in | Wt 141.0 lb

## 2018-07-28 DIAGNOSIS — I69320 Aphasia following cerebral infarction: Secondary | ICD-10-CM | POA: Diagnosis not present

## 2018-07-28 DIAGNOSIS — Z5189 Encounter for other specified aftercare: Secondary | ICD-10-CM | POA: Diagnosis not present

## 2018-07-28 DIAGNOSIS — I442 Atrioventricular block, complete: Secondary | ICD-10-CM

## 2018-07-28 DIAGNOSIS — I482 Chronic atrial fibrillation, unspecified: Secondary | ICD-10-CM

## 2018-07-28 LAB — CUP PACEART INCLINIC DEVICE CHECK
Battery Remaining Longevity: 123 mo
Battery Voltage: 3.01 V
Implantable Lead Implant Date: 20180808
Implantable Lead Location: 753860
Implantable Lead Model: 1948
Implantable Pulse Generator Implant Date: 20180808
Lead Channel Pacing Threshold Amplitude: 0.5 V
Lead Channel Pacing Threshold Pulse Width: 0.5 ms
Lead Channel Pacing Threshold Pulse Width: 0.5 ms
Lead Channel Setting Pacing Pulse Width: 0.5 ms
Lead Channel Setting Sensing Sensitivity: 0.5 mV
MDC IDC MSMT LEADCHNL RV IMPEDANCE VALUE: 650 Ohm
MDC IDC MSMT LEADCHNL RV PACING THRESHOLD AMPLITUDE: 0.5 V
MDC IDC MSMT LEADCHNL RV SENSING INTR AMPL: 5.1 mV
MDC IDC PG SERIAL: 7957820
MDC IDC SESS DTM: 20190819140641
MDC IDC SET LEADCHNL RV PACING AMPLITUDE: 2.5 V
MDC IDC STAT BRADY RV PERCENT PACED: 99.27 %

## 2018-07-28 NOTE — Progress Notes (Signed)
HPI Wendy Perkins returns today for PPM followup. She is a pleasant 84 yo woman with atrial fib, CHB, HTN, and dyslipidemia. She has been stable in the interim. No falls. No chest pain. No syncope.  Allergies  Allergen Reactions  . Sulfur Swelling and Rash     Current Outpatient Medications  Medication Sig Dispense Refill  . traMADol (ULTRAM) 50 MG tablet Take 1 tablet (50 mg total) by mouth 3 (three) times daily. 90 tablet 0   No current facility-administered medications for this visit.      Past Medical History:  Diagnosis Date  . Atrial fibrillation ([facility])   . Bradycardia   . Breast nodule 11/15/2014  . Breast pain, left 11/15/2014  . Carotid artery disease ([facility])   . Dizziness   . Dyspnea    Previous CPX suggesting possible restrictive physiology, respiratory muscle fatigue, diastolic dysfunction  . Essential hypertension, benign   . Hyperlipidemia   . Oxygen dependent    2 liter  . Pacemaker [place]   . Rib pain on left side 11/15/2014  . Seasonal allergies   . Shingles 05/04/2015  . Stroke ([facility])   . Toe fracture, left    left big toe    ROS:   All systems reviewed and negative except as noted in the HPI.   Past Surgical History:  Procedure Laterality Date  . COLONOSCOPY  02/22/2012   Procedure: COLONOSCOPY;  Surgeon: Rogene Houston, MD;  [facility];  Service: Endoscopy;  Laterality: N/A;  100  . KNEE SURGERY     bilateral  . PACEMAKER IMPLANT N/A 07/17/2017   Procedure: Pacemaker Implant;  Surgeon: Deboraha Sprang, MD;  Location: [facility];  Service: Cardiovascular;  Laterality: N/A;     Family History  Problem Relation Age of Onset  . Stroke Mother   . Pneumonia Father   . Heart disease Father   . Healthy Son   . Glaucoma Daughter   . Emphysema Daughter   . Healthy Daughter   . Other Daughter        had knee replacement  . Healthy Son   . Heart disease Maternal Grandmother      Social History   Socioeconomic  History  . Marital status: Widowed    Spouse name: Not on file  . Number of children: Not on file  . Years of education: Not on file  . Highest education level: Not on file  Occupational History  . Not on file  Social Needs  . Financial resource strain: Not on file  . Food insecurity:    Worry: Not on file    Inability: Not on file  . Transportation needs:    Medical: Not on file    Non-medical: Not on file  Tobacco Use  . Smoking status: Never Smoker  . Smokeless tobacco: Never Used  Substance and Sexual Activity  . Alcohol use: No  . Drug use: No  . Sexual activity: Never    Birth control/protection: Post-menopausal  Lifestyle  . Physical activity:    Days per week: Not on file    Minutes per session: Not on file  . Stress: Not on file  Relationships  . Social connections:    Talks on phone: Not on file    Gets together: Not on file    Attends religious service: Not on file    Active member of club or organization: Not on file    Attends  meetings of clubs or organizations: Not on file    Relationship status: Not on file  . Intimate partner violence:    Fear of current or ex partner: Not on file    Emotionally abused: Not on file    Physically abused: Not on file    Forced sexual activity: Not on file  Other Topics Concern  . Not on file  Social History Narrative  . Not on file     BP 135/63   Pulse 60   Ht 5\' 4"  (1.626 m)   Wt 141 lb (64 kg)   SpO2 95%   BMI 24.20 kg/m   Physical Exam:  Well appearing elderly woman, NAD HEENT: Unremarkable Neck:  6 cm JVD, no thyromegally Lymphatics:  No adenopathy Back:  No CVA tenderness Lungs:  Clear with no wheezes HEART:  Regular rate rhythm, no murmurs, no rubs, no clicks Abd:  soft, positive bowel sounds, no organomegally, no rebound, no guarding Ext:  2 plus pulses, no edema, no cyanosis, no clubbing Skin:  No rashes no nodules Neuro:  CN II through XII intact, motor grossly intact  EKG - none  DEVICE   Normal device function.  See PaceArt for details.   Assess/Plan: 1. Atrial fib - her ventricular rate is well controlled. She is tolerating systemic anti-coagulation. No bleeding 2. PPM - her [employer] ppm is working normally.  3. HTN - her blood pressure is well controlled. No change in meds.  Mikle Bosworth.D.

## 2018-07-28 NOTE — Patient Instructions (Signed)
Medication Instructions:  Your physician recommends that you continue on your current medications as directed. Please refer to the Current Medication list given to you today.   Labwork: NONE   Testing/Procedures: NONE   Follow-Up: Your physician wants you to follow-up in: 1 Year with Dr. Taylor. You will receive a reminder letter in the mail two months in advance. If you don't receive a letter, please call our office to schedule the follow-up appointment.   Any Other Special Instructions Will Be Listed Below (If Applicable).     If you need a refill on your cardiac medications before your next appointment, please call your pharmacy.  Thank you for choosing [facility]!   

## 2018-07-29 DIAGNOSIS — I69320 Aphasia following cerebral infarction: Secondary | ICD-10-CM | POA: Diagnosis not present

## 2018-07-29 DIAGNOSIS — Z5189 Encounter for other specified aftercare: Secondary | ICD-10-CM | POA: Diagnosis not present

## 2018-07-30 DIAGNOSIS — I69320 Aphasia following cerebral infarction: Secondary | ICD-10-CM | POA: Diagnosis not present

## 2018-07-30 DIAGNOSIS — Z5189 Encounter for other specified aftercare: Secondary | ICD-10-CM | POA: Diagnosis not present

## 2018-07-31 DIAGNOSIS — Z5189 Encounter for other specified aftercare: Secondary | ICD-10-CM | POA: Diagnosis not present

## 2018-07-31 DIAGNOSIS — I69320 Aphasia following cerebral infarction: Secondary | ICD-10-CM | POA: Diagnosis not present

## 2018-08-01 DIAGNOSIS — Z5189 Encounter for other specified aftercare: Secondary | ICD-10-CM | POA: Diagnosis not present

## 2018-08-01 DIAGNOSIS — I69320 Aphasia following cerebral infarction: Secondary | ICD-10-CM | POA: Diagnosis not present

## 2018-08-04 DIAGNOSIS — I69320 Aphasia following cerebral infarction: Secondary | ICD-10-CM | POA: Diagnosis not present

## 2018-08-04 DIAGNOSIS — Z5189 Encounter for other specified aftercare: Secondary | ICD-10-CM | POA: Diagnosis not present

## 2018-08-06 DIAGNOSIS — Z5189 Encounter for other specified aftercare: Secondary | ICD-10-CM | POA: Diagnosis not present

## 2018-08-06 DIAGNOSIS — I69320 Aphasia following cerebral infarction: Secondary | ICD-10-CM | POA: Diagnosis not present

## 2018-08-07 ENCOUNTER — Non-Acute Institutional Stay (SKILLED_NURSING_FACILITY): Payer: Medicare Other | Admitting: Internal Medicine

## 2018-08-07 ENCOUNTER — Encounter: Payer: Self-pay | Admitting: Internal Medicine

## 2018-08-07 DIAGNOSIS — I482 Chronic atrial fibrillation, unspecified: Secondary | ICD-10-CM

## 2018-08-07 DIAGNOSIS — I69320 Aphasia following cerebral infarction: Secondary | ICD-10-CM | POA: Diagnosis not present

## 2018-08-07 DIAGNOSIS — Z5189 Encounter for other specified aftercare: Secondary | ICD-10-CM | POA: Diagnosis not present

## 2018-08-07 DIAGNOSIS — R600 Localized edema: Secondary | ICD-10-CM

## 2018-08-07 DIAGNOSIS — J449 Chronic obstructive pulmonary disease, unspecified: Secondary | ICD-10-CM

## 2018-08-07 DIAGNOSIS — I5032 Chronic diastolic (congestive) heart failure: Secondary | ICD-10-CM | POA: Diagnosis not present

## 2018-08-07 NOTE — Progress Notes (Signed)
Location:    [facility] Room Number: 156/W Place of Service:  SNF [phone]) Provider:  Freddi Starr, MD  Patient Care Team: Virgie Dad, MD as PCP - General (Internal Medicine) Rolm Baptise as Physician Assistant (Internal Medicine)  Extended Emergency Contact Information Primary Emergency Contact: Adin Hector, [place] [address] [name] of [place] Phone: [phone] Relation: Son Secondary Emergency Contact: [name], [place] [address] [name] of [place] Phone: [phone] Mobile Phone: [phone] Relation: Son  Code Status:  DNR Goals of care: Advanced Directive information Advanced Directives 08/07/2018  Does Patient Have a Medical Advance Directive? -  Type of Advance Directive Out of facility DNR (pink MOST or yellow form)  Does patient want to make changes to medical advance directive? No - Patient declined  Copy of [place] in Chart? No - copy requested  Would patient like information on creating a medical advance directive? No - Patient declined  Pre-existing out of facility DNR order (yellow form or pink MOST form) -     Chief Complaint  Patient presents with  . Acute Visit    Patient is being seen for foot edema    HPI:  Pt is a 84 y.o. female seen today for an acute visit for some increased foot edema bilaterally.  She is a long-term resident of facility has a history of atrial fibrillation on Eliquis and diltiazem- also has a history of diastolic CHF she is on Lasix with potassium supplementation.  In addition she has a history of CVA with expressive aphasia and a pacemaker with history of sick sinus syndrome she is followed by cardiology.  She was recently seen by cardiology and thought to be doing well with her issues.  Her weight appears relatively stable at 145 pounds per weight done earlier this week.  Her nurse today thinks she may  have some mildly increased foot edema so I am following up on this.  Patient herself is not complaining of any chest pain or increased shortness of breath-she does have some chronic lower extremity edema but this appears relatively baseline however she does appear to have some edema of her feet possibly slightly increased.  It is somewhat unclear whether she has had her feet more in a dependent position here earlier-.  Vital signs appear to be stable again her weight appears to be stable at around 145 pounds   Past Medical History:  Diagnosis Date  . Atrial fibrillation ([place])   . Bradycardia   . Breast nodule 11/15/2014  . Breast pain, left 11/15/2014  . Carotid artery disease ([place])   . Dizziness   . Dyspnea    Previous CPX suggesting possible restrictive physiology, respiratory muscle fatigue, diastolic dysfunction  . Essential hypertension, benign   . Hyperlipidemia   . Oxygen dependent    2 liter  . Pacemaker [place]   . Rib pain on left side 11/15/2014  . Seasonal allergies   . Shingles 05/04/2015  . Stroke ([place])   . Toe fracture, left    left big toe   Past Surgical History:  Procedure Laterality Date  . COLONOSCOPY  02/22/2012   Procedure: COLONOSCOPY;  Surgeon: Rogene Houston, MD;  [facility];  Service: Endoscopy;  Laterality: N/A;  100  . KNEE SURGERY     bilateral  . PACEMAKER  IMPLANT N/A 07/17/2017   Procedure: Pacemaker Implant;  Surgeon: Deboraha Sprang, MD;  Location: [facility];  Service: Cardiovascular;  Laterality: N/A;    Allergies  Allergen Reactions  . Sulfur Swelling and Rash    Outpatient Encounter Medications as of 08/07/2018  Medication Sig  . acetaminophen (TYLENOL) 500 MG tablet Take 1,000 mg by mouth 2 (two) times daily. Max of 3 grams acetaminophen per 24 hrs from all sources.  Marland Kitchen amLODipine (NORVASC) 2.5 MG tablet Take 2.5 mg by mouth daily.  Jearl Klinefelter ELLIPTA 62.5-25 MCG/INH AEPB Inhale 1 Inhaler into the lungs daily.   Marland Kitchen  apixaban (ELIQUIS) 2.5 MG TABS tablet Take 1 tablet (2.5 mg total) by mouth 2 (two) times daily. Resume 07/20/17  . artificial tears (LACRILUBE) OINT ophthalmic ointment Place 1 application into both eyes at bedtime.  Marland Kitchen atorvastatin (LIPITOR) 80 MG tablet Take 1 tablet (80 mg total) by mouth daily at 6 PM.  . cycloSPORINE (RESTASIS) 0.05 % ophthalmic emulsion Place 1 drop into both eyes every 12 (twelve) hours.   . diclofenac sodium (VOLTAREN) 1 % GEL Apply 1 application topically 4 (four) times daily as needed. Add to body where needed  . diltiazem (CARDIZEM CD) 180 MG 24 hr capsule TAKE ONE CAPSULE BY MOUTH ONCE DAILY.  . furosemide (LASIX) 40 MG tablet Take 40 mg by mouth 2 (two) times daily.  Marland Kitchen gabapentin (NEURONTIN) 100 MG capsule Take 100 mg by mouth 2 (two) times daily.  . hydrocortisone (ANUSOL-HC) 2.5 % rectal cream Place 1 application rectally 2 (two) times daily as needed for hemorrhoids or anal itching.  . Melatonin 5 MG TABS Take 5 mg by mouth at bedtime as needed.  . Multiple Vitamin (MULITIVITAMIN WITH MINERALS) TABS Take 1 tablet by mouth every other day.   . OXYGEN Inhale 2 L into the lungs 2 (two) times daily. 3:15pm to 11:15pm 11:15pm to 07:15am  . pantoprazole (PROTONIX) 40 MG tablet Take 40 mg by mouth daily.  Vladimir Faster Glycol-Propyl Glycol (SYSTANE OP) Place 1 drop into both eyes 3 (three) times daily. For dry eyes  . polyethylene glycol (MIRALAX / GLYCOLAX) packet Take 17 g by mouth daily.  . potassium chloride (K-DUR,KLOR-CON) 10 MEQ tablet Take 20 mEq by mouth daily.  Marland Kitchen PROAIR HFA 108 (90 BASE) MCG/ACT inhaler Inhale 2 puffs into the lungs 3 (three) times daily as needed for wheezing (cough).   . traMADol (ULTRAM) 50 MG tablet Take 1 tablet (50 mg total) by mouth 3 (three) times daily.  . [DISCONTINUED] Albuterol (VENTOLIN IN) Inhale into the lungs.     No facility-administered encounter medications on file as of 08/07/2018.     Review of Systems   General she is  not complaining of any fever chills weight appears to be stable-she says she is feeling pretty well.  Skin does not complain of rashes or itching.  Head ears eyes nose mouth and throat is not complaining of any sore throat or visual changes she does a history of retinal vein occlusion --has prescription lenses  Respiratory is not complaining of shortness of breath or cough.  Cardiac does not complain of any chest pain has chronic lower extremity edema this does not appear to be increased except possibly of her feet.  GI is not complaining of abdominal discomfort nausea vomiting diarrhea constipation.  Musculoskeletal does not complain of joint pain at this time has at times complained of pain especially of her knees but is not complaining of  that today she is receiving routine Ultram 3 times a day as well as as needed Voltaren gel.  Neurologic is not complaining of dizziness headache numbness or syncope.   psych appears to be in good spirits does not complain of feeling depressed or anxious appears to be feeling well today  Immunization History  Administered Date(s) Administered  . Influenza-Unspecified 10/10/2013, 09/11/2017  . Pneumococcal Conjugate-13 09/13/2017  . Tdap 09/16/2017  . Zoster 12/05/2017   Pertinent  Health Maintenance Due  Topic Date Due  . INFLUENZA VACCINE  09/07/2018 (Originally 07/10/2018)  . DEXA SCAN  09/07/2018 (Originally 01/15/1986)  . PNA vac Low Risk Adult (2 of 2 - PPSV23) 09/13/2018   Fall Risk  09/02/2017  Falls in the past year? Yes  Number falls in past yr: 1  Injury with Fall? No   Functional Status Survey:   She is afebrile pulse of 66 respirations of 16 blood pressure 140/70 weight is 145.2   Physical Exam  In general this is a pleasant elderly female in no distress sitting comfortably in her wheelchair.  Her skin is warm and dry- on the left side of her abdomen there is a small raised hardened area that is flesh-colored with with a central  almost head appearing area that is tan-colored and hard It is minimally tender is not erythematous or warm.  Eyes visual acuity appears grossly intact she has prescription lenses.  Oropharynx is clear mucous membranes moist.  --Chest is clear to auscultation there is no labored breathing.  Heart is regular irregular rate and rhythm with baseline systolic murmur- her edema of her legs appears to be stable may even improve however she does appear possibly have some increased edema of her feet.  The edema is cool to touch nonerythematous nontender.  Her abdomen is soft nontender with positive bowel sounds..  Musculoskeletal appears to move all extremities at baseline--has chronic arthritic changes to her knees bilaterally.  Neurologic she does have expressive aphasia- continues to move her extremities at baseline  Psych she continues to be pleasant and largely oriented   Labs reviewed: Recent Labs    04/02/18 0920 04/29/18 0711 06/18/18 0715  NA 140 140 142  K 4.0 4.2 4.1  CL 101 101 106  CO2 26 28 28   GLUCOSE 125* 93 88  BUN 29* 27* 28*  CREATININE 1.15* 1.04* 1.00  CALCIUM 9.1 8.8* 8.6*   Recent Labs    12/28/17 1040 04/02/18 0920 04/29/18 0711  AST 27 25 22   ALT 16 17 18   ALKPHOS 132* 144* 135*  BILITOT 1.0 1.2 0.8  PROT 7.1 7.2 6.9  ALBUMIN 3.7 3.8 3.8   Recent Labs    02/06/18 0630 04/02/18 0920 06/18/18 0715  WBC 6.6 8.7 6.0  NEUTROABS 3.7 5.8 3.3  HGB 13.6 13.4 12.2  HCT 43.4 42.8 38.1  MCV 87.3 89.2 88.0  PLT 219 208 200   Lab Results  Component Value Date   TSH 1.446 07/15/2017   Lab Results  Component Value Date   HGBA1C 5.5 12/06/2016   Lab Results  Component Value Date   CHOL 113 12/07/2017   HDL 56 12/07/2017   LDLCALC 48 12/07/2017   TRIG 47 12/07/2017   CHOLHDL 2.0 12/07/2017    Significant Diagnostic Results in last 30 days:  No results found.  Assessment/Plan  #1 pedal edema- this could be dependent related but will  order weights tomorrow and notify provider of any weight gain- her weights appear to be stable  otherwise edema appears to be baseline possibly slightly improved in her legs.  She continues on Lasix twice daily with potassium supplementation with a history of diastolic CHF she is not complaining of any shortness of breath or increased cough lung exam was fairly benign- will update a metabolic panel tomorrow as well as a weight.  2.-History of A. fib this appears rate controlled on diltiazem she is on Eliquis for anticoagulation not on aspirin because of history of GI bleed.  3.  History of sinus node dysfunction with pacemaker-again she has been evaluated by cardiology and thought to be doing well.  4.  History of CVA left MCA-continues on Eliquis and a statin- she does have expressive aphasia but has done well.  5.  History of COPD continues on Anoro  Ellipta and pro-air and this appears to be stable.  6.  History of dilated pancreatic duct-she has times has had some mildly elevated alkaline phosphatase- but this is been stable per GI no aggressive intervention if she remains stable and relatively asymptomatic- will update again a metabolic panel tomorrow and include liver function tests  [phone]

## 2018-08-08 ENCOUNTER — Encounter (HOSPITAL_COMMUNITY)
Admission: RE | Admit: 2018-08-08 | Discharge: 2018-08-08 | Disposition: A | Discharge status: Home / Self Care | Payer: Medicare Other | Source: Skilled Nursing Facility | Attending: Internal Medicine | Admitting: Internal Medicine

## 2018-08-08 DIAGNOSIS — I63312 Cerebral infarction due to thrombosis of left middle cerebral artery: Secondary | ICD-10-CM | POA: Diagnosis not present

## 2018-08-08 DIAGNOSIS — M6281 Muscle weakness (generalized): Secondary | ICD-10-CM | POA: Diagnosis not present

## 2018-08-08 DIAGNOSIS — Z950 Presence of cardiac pacemaker: Secondary | ICD-10-CM | POA: Insufficient documentation

## 2018-08-08 DIAGNOSIS — J449 Chronic obstructive pulmonary disease, unspecified: Secondary | ICD-10-CM | POA: Diagnosis not present

## 2018-08-08 DIAGNOSIS — M25512 Pain in left shoulder: Secondary | ICD-10-CM | POA: Diagnosis not present

## 2018-08-08 LAB — COMPREHENSIVE METABOLIC PANEL
ALBUMIN: 3.8 g/dL (ref 3.5–5.0)
ALK PHOS: 130 U/L — AB (ref 38–126)
ALT: 18 U/L (ref 0–44)
ANION GAP: 10 (ref 5–15)
AST: 24 U/L (ref 15–41)
BUN: 29 mg/dL — ABNORMAL HIGH (ref 8–23)
CALCIUM: 9 mg/dL (ref 8.9–10.3)
CO2: 31 mmol/L (ref 22–32)
Chloride: 101 mmol/L (ref 98–111)
Creatinine, Ser: 1.09 mg/dL — ABNORMAL HIGH (ref 0.44–1.00)
GFR calc Af Amer: 48 mL/min — ABNORMAL LOW (ref >60)
GFR calc non Af Amer: 41 mL/min — ABNORMAL LOW (ref >60)
GLUCOSE: 101 mg/dL — AB (ref 70–99)
Potassium: 3.6 mmol/L (ref 3.5–5.1)
SODIUM: 142 mmol/L (ref 135–145)
TOTAL PROTEIN: 7.3 g/dL (ref 6.5–8.1)
Total Bilirubin: 0.7 mg/dL (ref 0.3–1.2)

## 2018-08-11 DIAGNOSIS — I69320 Aphasia following cerebral infarction: Secondary | ICD-10-CM | POA: Diagnosis not present

## 2018-08-11 DIAGNOSIS — Z5189 Encounter for other specified aftercare: Secondary | ICD-10-CM | POA: Diagnosis not present

## 2018-08-12 ENCOUNTER — Other Ambulatory Visit: Payer: Self-pay

## 2018-08-12 MED ORDER — TRAMADOL HCL 50 MG PO TABS: 50.0000 mg | ORAL_TABLET | Freq: Three times a day (TID) | ORAL | 0 refills | Status: DC

## 2018-08-12 NOTE — Telephone Encounter (Signed)
RX Fax for [facility]@ [phone]  

## 2018-08-13 DIAGNOSIS — I69320 Aphasia following cerebral infarction: Secondary | ICD-10-CM | POA: Diagnosis not present

## 2018-08-13 DIAGNOSIS — Z5189 Encounter for other specified aftercare: Secondary | ICD-10-CM | POA: Diagnosis not present

## 2018-08-15 DIAGNOSIS — Z5189 Encounter for other specified aftercare: Secondary | ICD-10-CM | POA: Diagnosis not present

## 2018-08-15 DIAGNOSIS — I69320 Aphasia following cerebral infarction: Secondary | ICD-10-CM | POA: Diagnosis not present

## 2018-08-17 LAB — CUP PACEART REMOTE DEVICE CHECK
Battery Remaining Longevity: 128 mo
Battery Remaining Percentage: 95.5 %
Battery Voltage: 3.01 V
Implantable Lead Implant Date: 20180808
Implantable Lead Model: 1948
Implantable Pulse Generator Implant Date: 20180808
Lead Channel Pacing Threshold Amplitude: 0.5 V
Lead Channel Pacing Threshold Pulse Width: 0.5 ms
Lead Channel Setting Pacing Pulse Width: 0.5 ms
Lead Channel Setting Sensing Sensitivity: 0.5 mV
MDC IDC LEAD LOCATION: 753860
MDC IDC MSMT LEADCHNL RV IMPEDANCE VALUE: 640 Ohm
MDC IDC MSMT LEADCHNL RV SENSING INTR AMPL: 8.7 mV
MDC IDC PG SERIAL: 7957820
MDC IDC SESS DTM: 20190813060014
MDC IDC SET LEADCHNL RV PACING AMPLITUDE: 2.5 V
MDC IDC STAT BRADY RV PERCENT PACED: 99 %

## 2018-08-18 DIAGNOSIS — Z5189 Encounter for other specified aftercare: Secondary | ICD-10-CM | POA: Diagnosis not present

## 2018-08-18 DIAGNOSIS — I69320 Aphasia following cerebral infarction: Secondary | ICD-10-CM | POA: Diagnosis not present

## 2018-08-21 DIAGNOSIS — I69320 Aphasia following cerebral infarction: Secondary | ICD-10-CM | POA: Diagnosis not present

## 2018-08-21 DIAGNOSIS — Z5189 Encounter for other specified aftercare: Secondary | ICD-10-CM | POA: Diagnosis not present

## 2018-08-22 DIAGNOSIS — Z5189 Encounter for other specified aftercare: Secondary | ICD-10-CM | POA: Diagnosis not present

## 2018-08-22 DIAGNOSIS — I69320 Aphasia following cerebral infarction: Secondary | ICD-10-CM | POA: Diagnosis not present

## 2018-08-25 DIAGNOSIS — I69320 Aphasia following cerebral infarction: Secondary | ICD-10-CM | POA: Diagnosis not present

## 2018-08-25 DIAGNOSIS — Z5189 Encounter for other specified aftercare: Secondary | ICD-10-CM | POA: Diagnosis not present

## 2018-08-27 DIAGNOSIS — Z5189 Encounter for other specified aftercare: Secondary | ICD-10-CM | POA: Diagnosis not present

## 2018-08-27 DIAGNOSIS — I69320 Aphasia following cerebral infarction: Secondary | ICD-10-CM | POA: Diagnosis not present

## 2018-09-01 ENCOUNTER — Ambulatory Visit (INDEPENDENT_AMBULATORY_CARE_PROVIDER_SITE_OTHER): Payer: Medicare Other | Admitting: Otolaryngology

## 2018-09-01 DIAGNOSIS — H6123 Impacted cerumen, bilateral: Secondary | ICD-10-CM

## 2018-09-01 DIAGNOSIS — H9202 Otalgia, left ear: Secondary | ICD-10-CM

## 2018-09-01 DIAGNOSIS — H903 Sensorineural hearing loss, bilateral: Secondary | ICD-10-CM | POA: Diagnosis not present

## 2018-09-05 ENCOUNTER — Other Ambulatory Visit: Payer: Self-pay

## 2018-09-05 MED ORDER — TRAMADOL HCL 50 MG PO TABS: 50.0000 mg | ORAL_TABLET | Freq: Three times a day (TID) | ORAL | 0 refills | Status: DC

## 2018-09-05 NOTE — Telephone Encounter (Signed)
RX Fax for [facility]@ [phone]  

## 2018-09-11 ENCOUNTER — Non-Acute Institutional Stay (SKILLED_NURSING_FACILITY): Payer: Medicare Other

## 2018-09-11 ENCOUNTER — Telehealth: Payer: Self-pay

## 2018-09-11 DIAGNOSIS — Z0000 Encounter for general adult medical examination without abnormal findings: Secondary | ICD-10-CM | POA: Diagnosis not present

## 2018-09-11 NOTE — Progress Notes (Signed)
Subjective:   Wendy Perkins is a 84 y.o. female who presents for Medicare Annual (Subsequent) preventive examination at [facility]  Last AWV-09/02/2017    Objective:     Vitals: BP 133/64 (BP Location: Left Arm, Patient Position: Sitting)   Pulse 62   Temp 97.8 F (36.6 C) (Oral)   Ht 5\' 4"  (1.626 m)   Wt 141 lb (64 kg)   BMI 24.20 kg/m   Body mass index is 24.2 kg/m.  Advanced Directives 09/11/2018 08/07/2018 06/17/2018 06/02/2018 05/08/2018 04/29/2018 04/24/2018  Does Patient Have a Medical Advance Directive? Yes - Yes Yes Yes Yes Yes  Type of Advance Directive Out of facility DNR (pink MOST or yellow form) Out of facility DNR (pink MOST or yellow form) Out of facility DNR (pink MOST or yellow form) Out of facility DNR (pink MOST or yellow form) Out of facility DNR (pink MOST or yellow form) Out of facility DNR (pink MOST or yellow form) Out of facility DNR (pink MOST or yellow form)  Does patient want to make changes to medical advance directive? No - Patient declined No - Patient declined No - Patient declined No - Patient declined No - Patient declined No - Patient declined No - Patient declined  Copy of [place] in Chart? No - copy requested No - copy requested No - copy requested No - copy requested No - copy requested No - copy requested No - copy requested  Would patient like information on creating a medical advance directive? No - Patient declined No - Patient declined No - Patient declined No - Patient declined No - Patient declined No - Patient declined No - Patient declined  Pre-existing out of facility DNR order (yellow form or pink MOST form) - - - - - - -    Tobacco Social History   Tobacco Use  Smoking Status Never Smoker  Smokeless Tobacco Never Used     Counseling given: Not Answered   Clinical Intake:  Pre-visit preparation completed: No  Pain : 0-10 Pain Score: 3  Pain Type: Chronic pain Pain Location:  Shoulder Pain Orientation: Left, Right Pain Descriptors / Indicators: Aching Pain Onset: More than a month ago Pain Frequency: Intermittent     Diabetes: No  How often do you need to have someone help you when you read instructions, pamphlets, or other written materials from your doctor or pharmacy?: 2 - Rarely What is the last grade level you completed in school?: college  Interpreter Needed?: No  Information entered by :: Tyson Dense, RN  Past Medical History:  Diagnosis Date  . Atrial fibrillation ([place])   . Bradycardia   . Breast nodule 11/15/2014  . Breast pain, left 11/15/2014  . Carotid artery disease ([place])   . Dizziness   . Dyspnea    Previous CPX suggesting possible restrictive physiology, respiratory muscle fatigue, diastolic dysfunction  . Essential hypertension, benign   . Hyperlipidemia   . Oxygen dependent    2 liter  . Pacemaker [place]   . Rib pain on left side 11/15/2014  . Seasonal allergies   . Shingles 05/04/2015  . Stroke ([place])   . Toe fracture, left    left big toe   Past Surgical History:  Procedure Laterality Date  . COLONOSCOPY  02/22/2012   Procedure: COLONOSCOPY;  Surgeon: Rogene Houston, MD;  [facility];  Service: Endoscopy;  Laterality: N/A;  100  . KNEE SURGERY  bilateral  . PACEMAKER IMPLANT N/A 07/17/2017   Procedure: Pacemaker Implant;  Surgeon: Deboraha Sprang, MD;  Location: [facility];  Service: Cardiovascular;  Laterality: N/A;   Family History  Problem Relation Age of Onset  . Stroke Mother   . Pneumonia Father   . Heart disease Father   . Healthy Son   . Glaucoma Daughter   . Emphysema Daughter   . Healthy Daughter   . Other Daughter        had knee replacement  . Healthy Son   . Heart disease Maternal Grandmother    Social History   Socioeconomic History  . Marital status: Widowed    Spouse name: Not on file  . Number of children: Not on file  . Years of education: Not on file  . Highest  education level: Not on file  Occupational History  . Not on file  Social Needs  . Financial resource strain: Not hard at all  . Food insecurity:    Worry: Never true    Inability: Never true  . Transportation needs:    Medical: No    Non-medical: No  Tobacco Use  . Smoking status: Never Smoker  . Smokeless tobacco: Never Used  Substance and Sexual Activity  . Alcohol use: No  . Drug use: No  . Sexual activity: Never    Birth control/protection: Post-menopausal  Lifestyle  . Physical activity:    Days per week: 0 days    Minutes per session: 0 min  . Stress: Not at all  Relationships  . Social connections:    Talks on phone: Three times a week    Gets together: Twice a week    Attends religious service: Never    Active member of club or organization: No    Attends meetings of clubs or organizations: Never    Relationship status: Widowed  Other Topics Concern  . Not on file  Social History Narrative  . Not on file    Outpatient Encounter Medications as of 09/11/2018  Medication Sig  . acetaminophen (TYLENOL) 500 MG tablet Take 1,000 mg by mouth 2 (two) times daily. Max of 3 grams acetaminophen per 24 hrs from all sources.  Marland Kitchen amLODipine (NORVASC) 2.5 MG tablet Take 2.5 mg by mouth daily.  Jearl Klinefelter ELLIPTA 62.5-25 MCG/INH AEPB Inhale 1 Inhaler into the lungs daily.   Marland Kitchen apixaban (ELIQUIS) 2.5 MG TABS tablet Take 1 tablet (2.5 mg total) by mouth 2 (two) times daily. Resume 07/20/17  . artificial tears (LACRILUBE) OINT ophthalmic ointment Place 1 application into both eyes at bedtime.  Marland Kitchen atorvastatin (LIPITOR) 80 MG tablet Take 1 tablet (80 mg total) by mouth daily at 6 PM.  . cycloSPORINE (RESTASIS) 0.05 % ophthalmic emulsion Place 1 drop into both eyes every 12 (twelve) hours.   . diclofenac sodium (VOLTAREN) 1 % GEL Apply 1 application topically 4 (four) times daily as needed. Add to body where needed  . diltiazem (CARDIZEM CD) 180 MG 24 hr capsule TAKE ONE CAPSULE BY MOUTH  ONCE DAILY.  . furosemide (LASIX) 40 MG tablet Take 40 mg by mouth 2 (two) times daily.  Marland Kitchen gabapentin (NEURONTIN) 100 MG capsule Take 100 mg by mouth 2 (two) times daily.  . hydrocortisone (ANUSOL-HC) 2.5 % rectal cream Place 1 application rectally 2 (two) times daily as needed for hemorrhoids or anal itching.  . Melatonin 5 MG TABS Take 5 mg by mouth at bedtime as needed.  . Multiple Vitamin (  MULITIVITAMIN WITH MINERALS) TABS Take 1 tablet by mouth every other day.   . OXYGEN Inhale 2 L into the lungs 2 (two) times daily. 3:15pm to 11:15pm 11:15pm to 07:15am  . pantoprazole (PROTONIX) 40 MG tablet Take 40 mg by mouth daily.  Vladimir Faster Glycol-Propyl Glycol (SYSTANE OP) Place 1 drop into both eyes 3 (three) times daily. For dry eyes  . polyethylene glycol (MIRALAX / GLYCOLAX) packet Take 17 g by mouth daily.  . potassium chloride (K-DUR,KLOR-CON) 10 MEQ tablet Take 20 mEq by mouth daily.  Marland Kitchen PROAIR HFA 108 (90 BASE) MCG/ACT inhaler Inhale 2 puffs into the lungs 3 (three) times daily as needed for wheezing (cough).   . traMADol (ULTRAM) 50 MG tablet Take 1 tablet (50 mg total) by mouth 3 (three) times daily.  . [DISCONTINUED] Albuterol (VENTOLIN IN) Inhale into the lungs.     No facility-administered encounter medications on file as of 09/11/2018.     Activities of Daily Living In your present state of health, do you have any difficulty performing the following activities: 09/11/2018  Hearing? N  Vision? N  Difficulty concentrating or making decisions? Y  Walking or climbing stairs? Y  Dressing or bathing? Y  Doing errands, shopping? Y  Preparing Food and eating ? Y  Using the Toilet? Y  In the past six months, have you accidently leaked urine? Y  Do you have problems with loss of bowel control? Y  Managing your Medications? Y  Managing your Finances? Y  Housekeeping or managing your Housekeeping? Y  Some recent data might be hidden    Patient Care Team: Virgie Dad, MD as PCP  - General (Internal Medicine) Rolm Baptise as Physician Assistant (Internal Medicine)    Assessment:   This is a routine wellness examination for Wendy Perkins.  Exercise Activities and Dietary recommendations Current Exercise Habits: The patient does not participate in regular exercise at present, Exercise limited by: orthopedic condition(s)  Goals   None     Fall Risk Fall Risk  09/11/2018 09/02/2017  Falls in the past year? No Yes  Number falls in past yr: - 1  Injury with Fall? - No   Is the patient's home free of loose throw rugs in walkways, pet beds, electrical cords, etc?   yes      Grab bars in the bathroom? yes      Handrails on the stairs?   yes      Adequate lighting?   yes  Depression Screen PHQ 2/9 Scores 09/11/2018 09/02/2017  PHQ - 2 Score 0 0     Cognitive Function     6CIT Screen 09/11/2018 09/02/2017  What Year? 0 points 4 points  What month? 0 points 3 points  What time? 0 points 0 points  Count back from 20 0 points 0 points  Months in reverse 4 points 4 points  Repeat phrase 4 points 2 points  Total Score 8 13    Immunization History  Administered Date(s) Administered  . Influenza-Unspecified 10/10/2013, 09/11/2017  . Pneumococcal Conjugate-13 09/13/2017  . Tdap 09/16/2017  . Zoster 12/05/2017    Qualifies for Shingles Vaccine? Not in past records  Screening Tests Health Maintenance  Topic Date Due  . INFLUENZA VACCINE  07/10/2018  . PNA vac Low Risk Adult (2 of 2 - PPSV23) 09/13/2018  . TETANUS/TDAP  09/17/2027  . DEXA SCAN  Discontinued    Cancer Screenings: Lung: Low Dose CT Chest recommended if Age 72-80 years, 38  pack-year currently smoking OR have quit w/in 15years. Patient does not qualify. Breast:  Up to date on Mammogram? Yes   Up to date of Bone Density/Dexa? Excluded due to age and unambulatory Colorectal: up to date  Additional Screenings:  Hepatitis C Screening: declined Flu vaccine due: will receive at [facility]:    I have personally reviewed and addressed the Medicare Annual Wellness questionnaire and have noted the following in the patient's chart:  A. Medical and social history B. Use of alcohol, tobacco or illicit drugs  C. Current medications and supplements D. Functional ability and status E.  Nutritional status F.  Physical activity G. Advance directives H. List of other physicians I.  Hospitalizations, surgeries, and ER visits in previous 12 months J.  [place] to include hearing, vision, cognitive, depression L. Referrals and appointments - none  In addition, I have reviewed and discussed with patient certain preventive protocols, quality metrics, and best practice recommendations. A written personalized care plan for preventive services as well as general preventive health recommendations were provided to patient.  See attached scanned questionnaire for additional information.   Signed,   Tyson Dense, RN Nurse Health Advisor  Patient Concerns: Knees hurt and said her braces don't get put on every day as they should

## 2018-09-11 NOTE — Patient Instructions (Signed)
Wendy Perkins , Thank you for taking time to come for your Medicare Wellness Visit. I appreciate your ongoing commitment to your health goals. Please review the following plan we discussed and let me know if I can assist you in the future.   Screening recommendations/referrals: Colonoscopy excluded over age 82 Mammogram excluded over age 19 Bone Density excluded due to age and unambulatory Recommended yearly ophthalmology/optometry visit for glaucoma screening and checkup Recommended yearly dental visit for hygiene and checkup  Vaccinations: Influenza vaccine due, will receive at [facility] Pneumococcal vaccine up to date, completed Tdap vaccine up to date, due 09/17/2027 Shingles vaccine not in past records    Advanced directives: in chart  Conditions/risks identified: none  Next appointment: Dr. Lyndel Safe makes rounds   Preventive Care 65 Years and Older, Female Preventive care refers to lifestyle choices and visits with your health care provider that can promote health and wellness. What does preventive care include?  A yearly physical exam. This is also called an annual well check.  Dental exams once or twice a year.  Routine eye exams. Ask your health care provider how often you should have your eyes checked.  Personal lifestyle choices, including:  Daily care of your teeth and gums.  Regular physical activity.  Eating a healthy diet.  Avoiding tobacco and drug use.  Limiting alcohol use.  Practicing safe sex.  Taking low-dose aspirin every day.  Taking vitamin and mineral supplements as recommended by your health care provider. What happens during an annual well check? The services and screenings done by your health care provider during your annual well check will depend on your age, overall health, lifestyle risk factors, and family history of disease. Counseling  Your health care provider may ask you questions about your:  Alcohol use.  Tobacco use.  Drug  use.  Emotional well-being.  Home and relationship well-being.  Sexual activity.  Eating habits.  History of falls.  Memory and ability to understand (cognition).  Work and work Statistician.  Reproductive health. Screening  You may have the following tests or measurements:  Height, weight, and BMI.  Blood pressure.  Lipid and cholesterol levels. These may be checked every 5 years, or more frequently if you are over 77 years old.  Skin check.  Lung cancer screening. You may have this screening every year starting at age 71 if you have a 30-pack-year history of smoking and currently smoke or have quit within the past 15 years.  Fecal occult blood test (FOBT) of the stool. You may have this test every year starting at age 44.  Flexible sigmoidoscopy or colonoscopy. You may have a sigmoidoscopy every 5 years or a colonoscopy every 10 years starting at age 88.  Hepatitis C blood test.  Hepatitis B blood test.  Sexually transmitted disease (STD) testing.  Diabetes screening. This is done by checking your blood sugar (glucose) after you have not eaten for a while (fasting). You may have this done every 1-3 years.  Bone density scan. This is done to screen for osteoporosis. You may have this done starting at age 69.  Mammogram. This may be done every 1-2 years. Talk to your health care provider about how often you should have regular mammograms. Talk with your health care provider about your test results, treatment options, and if necessary, the need for more tests. Vaccines  Your health care provider may recommend certain vaccines, such as:  Influenza vaccine. This is recommended every year.  Tetanus, diphtheria, and acellular pertussis (  Tdap, Td) vaccine. You may need a Td booster every 10 years.  Zoster vaccine. You may need this after age 42.  Pneumococcal 13-valent conjugate (PCV13) vaccine. One dose is recommended after age 33.  Pneumococcal polysaccharide  (PPSV23) vaccine. One dose is recommended after age 34. Talk to your health care provider about which screenings and vaccines you need and how often you need them. This information is not intended to replace advice given to you by your health care provider. Make sure you discuss any questions you have with your health care provider. Document Released: 12/23/2015 Document Revised: 08/15/2016 Document Reviewed: 09/27/2015 Elsevier Interactive Patient Education  2017 [place] Prevention in the Home Falls can cause injuries. They can happen to people of all ages. There are many things you can do to make your home safe and to help prevent falls. What can I do on the outside of my home?  Regularly fix the edges of walkways and driveways and fix any cracks.  Remove anything that might make you trip as you walk through a door, such as a raised step or threshold.  Trim any bushes or trees on the path to your home.  Use bright outdoor lighting.  Clear any walking paths of anything that might make someone trip, such as rocks or tools.  Regularly check to see if handrails are loose or broken. Make sure that both sides of any steps have handrails.  Any raised decks and porches should have guardrails on the edges.  Have any leaves, snow, or ice cleared regularly.  Use sand or salt on walking paths during winter.  Clean up any spills in your garage right away. This includes oil or grease spills. What can I do in the bathroom?  Use night lights.  Install grab bars by the toilet and in the tub and shower. Do not use towel bars as grab bars.  Use non-skid mats or decals in the tub or shower.  If you need to sit down in the shower, use a plastic, non-slip stool.  Keep the floor dry. Clean up any water that spills on the floor as soon as it happens.  Remove soap buildup in the tub or shower regularly.  Attach bath mats securely with double-sided non-slip rug tape.  Do not have  throw rugs and other things on the floor that can make you trip. What can I do in the bedroom?  Use night lights.  Make sure that you have a light by your bed that is easy to reach.  Do not use any sheets or blankets that are too big for your bed. They should not hang down onto the floor.  Have a firm chair that has side arms. You can use this for support while you get dressed.  Do not have throw rugs and other things on the floor that can make you trip. What can I do in the kitchen?  Clean up any spills right away.  Avoid walking on wet floors.  Keep items that you use a lot in easy-to-reach places.  If you need to reach something above you, use a strong step stool that has a grab bar.  Keep electrical cords out of the way.  Do not use floor polish or wax that makes floors slippery. If you must use wax, use non-skid floor wax.  Do not have throw rugs and other things on the floor that can make you trip. What can I do with my stairs?  Do  not leave any items on the stairs.  Make sure that there are handrails on both sides of the stairs and use them. Fix handrails that are broken or loose. Make sure that handrails are as long as the stairways.  Check any carpeting to make sure that it is firmly attached to the stairs. Fix any carpet that is loose or worn.  Avoid having throw rugs at the top or bottom of the stairs. If you do have throw rugs, attach them to the floor with carpet tape.  Make sure that you have a light switch at the top of the stairs and the bottom of the stairs. If you do not have them, ask someone to add them for you. What else can I do to help prevent falls?  Wear shoes that:  Do not have high heels.  Have rubber bottoms.  Are comfortable and fit you well.  Are closed at the toe. Do not wear sandals.  If you use a stepladder:  Make sure that it is fully opened. Do not climb a closed stepladder.  Make sure that both sides of the stepladder are  locked into place.  Ask someone to hold it for you, if possible.  Clearly mark and make sure that you can see:  Any grab bars or handrails.  First and last steps.  Where the edge of each step is.  Use tools that help you move around (mobility aids) if they are needed. These include:  Canes.  Walkers.  Scooters.  Crutches.  Turn on the lights when you go into a dark area. Replace any light bulbs as soon as they burn out.  Set up your furniture so you have a clear path. Avoid moving your furniture around.  If any of your floors are uneven, fix them.  If there are any pets around you, be aware of where they are.  Review your medicines with your doctor. Some medicines can make you feel dizzy. This can increase your chance of falling. Ask your doctor what other things that you can do to help prevent falls. This information is not intended to replace advice given to you by your health care provider. Make sure you discuss any questions you have with your health care provider. Document Released: 09/22/2009 Document Revised: 05/03/2016 Document Reviewed: 12/31/2014 Elsevier Interactive Patient Education  2017 [employer].

## 2018-09-11 NOTE — Telephone Encounter (Signed)
Patient has reminder from physical therapist on wall to have her knee braces put on daily. Patient stated that that does not happen and she has knee pain. Is there a way to make an order for the staff to complete this daily?

## 2018-09-16 ENCOUNTER — Non-Acute Institutional Stay (SKILLED_NURSING_FACILITY): Payer: Medicare Other | Admitting: Internal Medicine

## 2018-09-16 DIAGNOSIS — I10 Essential (primary) hypertension: Secondary | ICD-10-CM | POA: Diagnosis not present

## 2018-09-16 DIAGNOSIS — I4891 Unspecified atrial fibrillation: Secondary | ICD-10-CM | POA: Diagnosis not present

## 2018-09-16 DIAGNOSIS — I639 Cerebral infarction, unspecified: Secondary | ICD-10-CM | POA: Diagnosis not present

## 2018-09-16 NOTE — Progress Notes (Signed)
Location:  [facility] [facility] Service:  SNF [phone]) Provider:    Virgie Dad, MD  Patient Care Team: Virgie Dad, MD as PCP - General (Internal Medicine) Rolm Baptise as Physician Assistant (Internal Medicine)  Extended Emergency Contact Information Primary Emergency Contact: Beva, Remund, [place] [address] [name] of [place] Phone: [phone] Relation: Son Secondary Emergency Contact: [name], [place] [address] [name] of [place] Phone: [phone] Mobile Phone: [phone] Relation: Son  Code Status:  DNR Goals of care: Advanced Directive information Advanced Directives 09/11/2018  Does Patient Have a Medical Advance Directive? Yes  Type of Advance Directive Out of facility DNR (pink MOST or yellow form)  Does patient want to make changes to medical advance directive? No - Patient declined  Copy of [place] in Chart? No - copy requested  Would patient like information on creating a medical advance directive? No - Patient declined  Pre-existing out of facility DNR order (yellow form or pink MOST form) -     Chief Complaint  Patient presents with  . Medical Management of Chronic Issues    HPI:  Pt is a 84 y.o. female seen today for medical management of chronic diseases.    Patient has h/o Atrial fibrillation on Eliquis, S/P Pacemaker for sick sinus,S/P Acute CVA involving left MCA , ICA stenosis,Diastolic CHF,Dilated Pancreatic Duct, Hemorrhoids,hyperlipidemia,  Hypertension ,Arthritis with Chronic Knee and Back Pain,.Right retinal vein occlusion. Patient weight is stable at 145 lbs. Patient continues to have multiple complains including Pain in Shoulder Joint and Both Legs. She also has LE edema which is chronic. She did not have any New Nursing Issues. Patient is mostly Wheelchair Dependent. Therapy has not recommended for her to walk.  Past Medical History:    Diagnosis Date  . Atrial fibrillation ([place])   . Bradycardia   . Breast nodule 11/15/2014  . Breast pain, left 11/15/2014  . Carotid artery disease ([place])   . Dizziness   . Dyspnea    Previous CPX suggesting possible restrictive physiology, respiratory muscle fatigue, diastolic dysfunction  . Essential hypertension, benign   . Hyperlipidemia   . Oxygen dependent    2 liter  . Pacemaker [address]   . Rib pain on left side 11/15/2014  . Seasonal allergies   . Shingles 05/04/2015  . Stroke ([place])   . Toe fracture, left    left big toe   Past Surgical History:  Procedure Laterality Date  . COLONOSCOPY  02/22/2012   Procedure: COLONOSCOPY;  Surgeon: Rogene Houston, MD;  Location: [facility];  Service: Endoscopy;  Laterality: N/A;  100  . KNEE SURGERY     bilateral  . PACEMAKER IMPLANT N/A 07/17/2017   Procedure: Pacemaker Implant;  Surgeon: Deboraha Sprang, MD;  Location: [facility];  Service: Cardiovascular;  Laterality: N/A;    Allergies  Allergen Reactions  . Sulfur Swelling and Rash    Outpatient Encounter Medications as of 09/16/2018  Medication Sig  . acetaminophen (TYLENOL) 500 MG tablet Take 1,000 mg by mouth 2 (two) times daily. Max of 3 grams acetaminophen per 24 hrs from all sources.  Marland Kitchen amLODipine (NORVASC) 2.5 MG tablet Take 2.5 mg by mouth daily.  Jearl Klinefelter ELLIPTA 62.5-25 MCG/INH AEPB Inhale 1 Inhaler into the lungs daily.   Marland Kitchen apixaban (ELIQUIS) 2.5 MG TABS tablet  Take 1 tablet (2.5 mg total) by mouth 2 (two) times daily. Resume 07/20/17  . artificial tears (LACRILUBE) OINT ophthalmic ointment Place 1 application into both eyes at bedtime.  Marland Kitchen atorvastatin (LIPITOR) 80 MG tablet Take 1 tablet (80 mg total) by mouth daily at 6 PM.  . cycloSPORINE (RESTASIS) 0.05 % ophthalmic emulsion Place 1 drop into both eyes every 12 (twelve) hours.   . diclofenac sodium (VOLTAREN) 1 % GEL Apply 1 application topically 4 (four) times daily as needed. Add to body where needed  .  diltiazem (CARDIZEM CD) 180 MG 24 hr capsule TAKE ONE CAPSULE BY MOUTH ONCE DAILY.  . furosemide (LASIX) 40 MG tablet Take 40 mg by mouth 2 (two) times daily.  Marland Kitchen gabapentin (NEURONTIN) 100 MG capsule Take 100 mg by mouth 2 (two) times daily.  . hydrocortisone (ANUSOL-HC) 2.5 % rectal cream Place 1 application rectally 2 (two) times daily as needed for hemorrhoids or anal itching.  . Melatonin 5 MG TABS Take 5 mg by mouth at bedtime as needed.  . Multiple Vitamin (MULITIVITAMIN WITH MINERALS) TABS Take 1 tablet by mouth every other day.   . pantoprazole (PROTONIX) 40 MG tablet Take 40 mg by mouth daily.  Vladimir Faster Glycol-Propyl Glycol (SYSTANE OP) Place 1 drop into both eyes 3 (three) times daily. For dry eyes  . polyethylene glycol (MIRALAX / GLYCOLAX) packet Take 17 g by mouth daily.  . potassium chloride (K-DUR,KLOR-CON) 10 MEQ tablet Take 20 mEq by mouth daily.  Marland Kitchen PROAIR HFA 108 (90 BASE) MCG/ACT inhaler Inhale 2 puffs into the lungs 3 (three) times daily as needed for wheezing (cough).   . [DISCONTINUED] traMADol (ULTRAM) 50 MG tablet Take 1 tablet (50 mg total) by mouth 3 (three) times daily.  . OXYGEN Inhale 2 L into the lungs 2 (two) times daily. 3:15pm to 11:15pm 11:15pm to 07:15am  . [DISCONTINUED] Albuterol (VENTOLIN IN) Inhale into the lungs.     No facility-administered encounter medications on file as of 09/16/2018.     Review of Systems  Constitutional: Negative.   HENT: Negative.   Respiratory: Negative.   Cardiovascular: Positive for leg swelling.  Gastrointestinal: Negative.   Genitourinary: Negative.   Musculoskeletal: Positive for arthralgias and neck pain.  Neurological: Positive for weakness.  Psychiatric/Behavioral: Positive for dysphoric mood and sleep disturbance.    Immunization History  Administered Date(s) Administered  . Influenza-Unspecified 10/10/2013, 09/11/2017  . Pneumococcal Conjugate-13 09/13/2017  . Tdap 09/16/2017  . Zoster 12/05/2017    Pertinent  Health Maintenance Due  Topic Date Due  . INFLUENZA VACCINE  07/10/2018  . PNA vac Low Risk Adult (2 of 2 - PPSV23) 09/13/2018  . DEXA SCAN  Discontinued   Fall Risk  09/11/2018 09/02/2017  Falls in the past year? No Yes  Number falls in past yr: - 1  Injury with Fall? - No   Functional Status Survey:    Vitals:   09/26/18 1540  BP: 124/68  Pulse: 81  Resp: 18  Temp: (!) 97.5 F (36.4 C)   There is no height or weight on file to calculate BMI. Physical Exam  Constitutional: She appears well-developed and well-nourished.  HENT:  Head: Normocephalic.  Mouth/Throat: Oropharynx is clear and moist.  Eyes: Pupils are equal, round, and reactive to light.  Neck: Neck supple.  Cardiovascular: Normal rate and regular rhythm.  Murmur heard. Pulmonary/Chest: Effort normal and breath sounds normal. No stridor. No respiratory distress. She has no wheezes.  Abdominal: Soft. Bowel sounds  are normal. She exhibits no distension. There is no tenderness. There is no guarding.  Musculoskeletal:  Bilateral moderate edema with Left more then Right  Lymphadenopathy:    She has no cervical adenopathy.  Neurological: She is alert.  Skin: Skin is warm and dry.  Psychiatric: She has a normal mood and affect. Her behavior is normal. Thought content normal.    Labs reviewed: Recent Labs    06/18/18 0715 08/08/18 0700 09/18/18 0700  NA 142 142 142  K 4.1 3.6 3.9  CL 106 101 106  CO2 _0 GLUCOSE 88 101* 94  BUN 28* 29* 27*  CREATININE 1.00 1.09* 1.07*  CALCIUM 8.6* 9.0 8.5*   Recent Labs    04/29/18 0711 08/08/18 0700 09/18/18 0700  AST _1 ALT _2 ALKPHOS 135* 130* 111  BILITOT 0.8 0.7 0.7  PROT 6.9 7.3 6.4*  ALBUMIN 3.8 3.8 3.4*   Recent Labs    02/06/18 0630 04/02/18 0920 06/18/18 0715 09/18/18 0700  WBC 6.6 8.7 6.0 5.7  NEUTROABS 3.7 5.8 3.3  --   HGB 13.6 13.4 12.2 12.9  HCT 43.4 42.8 38.1 41.7  MCV 87.3 89.2 88.0 88.2  PLT 219 208  200 185   Lab Results  Component Value Date   TSH 1.446 07/15/2017   Lab Results  Component Value Date   HGBA1C 5.5 12/06/2016   Lab Results  Component Value Date   CHOL 113 12/07/2017   HDL 56 12/07/2017   LDLCALC 48 12/07/2017   TRIG 47 12/07/2017   CHOLHDL 2.0 12/07/2017    Significant Diagnostic Results in last 30 days:  No results found.  Assessment/Plan Essential hypertension Blood pressure is  controlled on a low-dose of Norvasc She is also on Lasix and Diltiazem Chronic diastolic heart failure On Lasix twice daily Renal function stable Her Edema in chronic We will repeat BMP We will continue the same dose  S/P Pacemaker insertion due to Sinus Node dysfunction Patient has done well in facility. She is stable  Follows with cardiology No Change on Last Visit   Chronic atrial fibrillation  Rate is controlled on Diltiazem. Continue on lower dose of Eliquis as she has history of GI bleed  Internal carotid artery stenosis, bilateral Patient on Eliquis. Son has decided to not be aggressive.  S/P Cerebrovascular accident with mild Dysphasia Doing well on Eliquis. Aspirin was held due to her GI bleed. No change required. She is on high dose of Statins Dilated pancreatic duct with elevated alk phos Patient follows with GI Ultrasound was negative. At this time no aggressive workup needed Hemorrhoids Patient has not had any more episodes of rectal bleed.or abdominal Pain Her Hgb has been stable . Repeat CBC  GI had decided not to do any banding procedure due to her age and Eliquis. Will Restart her on Anusol  Insomnia Melatonin as needed. Back and Joint pain Controlled on Ultram.TID An dTyelenol Would not incrase the dose right now She is also on Neurontin.   And on Voltaren gel      Family/ staff Communication:   Labs/tests ordered:  BMP and Hepatic Panel, CBC

## 2018-09-17 ENCOUNTER — Encounter (HOSPITAL_COMMUNITY)
Admission: RE | Admit: 2018-09-17 | Discharge: 2018-09-17 | Disposition: A | Discharge status: Home / Self Care | Payer: Medicare Other | Source: Skilled Nursing Facility | Attending: Internal Medicine | Admitting: Internal Medicine

## 2018-09-17 DIAGNOSIS — Z950 Presence of cardiac pacemaker: Secondary | ICD-10-CM | POA: Insufficient documentation

## 2018-09-17 DIAGNOSIS — I10 Essential (primary) hypertension: Secondary | ICD-10-CM | POA: Insufficient documentation

## 2018-09-17 DIAGNOSIS — J449 Chronic obstructive pulmonary disease, unspecified: Secondary | ICD-10-CM | POA: Insufficient documentation

## 2018-09-17 DIAGNOSIS — M6281 Muscle weakness (generalized): Secondary | ICD-10-CM | POA: Insufficient documentation

## 2018-09-17 DIAGNOSIS — M25512 Pain in left shoulder: Secondary | ICD-10-CM | POA: Insufficient documentation

## 2018-09-17 DIAGNOSIS — I63312 Cerebral infarction due to thrombosis of left middle cerebral artery: Secondary | ICD-10-CM | POA: Insufficient documentation

## 2018-09-18 ENCOUNTER — Encounter (HOSPITAL_COMMUNITY)
Admission: RE | Admit: 2018-09-18 | Discharge: 2018-09-18 | Disposition: A | Discharge status: Home / Self Care | Payer: Medicare Other | Source: Skilled Nursing Facility | Attending: Internal Medicine | Admitting: Internal Medicine

## 2018-09-18 DIAGNOSIS — M25512 Pain in left shoulder: Secondary | ICD-10-CM | POA: Insufficient documentation

## 2018-09-18 DIAGNOSIS — J449 Chronic obstructive pulmonary disease, unspecified: Secondary | ICD-10-CM | POA: Diagnosis not present

## 2018-09-18 DIAGNOSIS — I63312 Cerebral infarction due to thrombosis of left middle cerebral artery: Secondary | ICD-10-CM | POA: Diagnosis not present

## 2018-09-18 LAB — BASIC METABOLIC PANEL
Anion gap: 11 (ref 5–15)
BUN: 27 mg/dL — AB (ref 8–23)
CHLORIDE: 106 mmol/L (ref 98–111)
CO2: 25 mmol/L (ref 22–32)
Calcium: 8.5 mg/dL — ABNORMAL LOW (ref 8.9–10.3)
Creatinine, Ser: 1.07 mg/dL — ABNORMAL HIGH (ref 0.44–1.00)
GFR, EST AFRICAN AMERICAN: 49 mL/min — AB (ref >60)
GFR, EST NON AFRICAN AMERICAN: 42 mL/min — AB (ref >60)
Glucose, Bld: 94 mg/dL (ref 70–99)
POTASSIUM: 3.9 mmol/L (ref 3.5–5.1)
Sodium: 142 mmol/L (ref 135–145)

## 2018-09-18 LAB — CBC
HCT: 41.7 % (ref 36.0–46.0)
Hemoglobin: 12.9 g/dL (ref 12.0–15.0)
MCH: 27.3 pg (ref 26.0–34.0)
MCHC: 30.9 g/dL (ref 30.0–36.0)
MCV: 88.2 fL (ref 80.0–100.0)
NRBC: 0 % (ref 0.0–0.2)
PLATELETS: 185 10*3/uL (ref 150–400)
RBC: 4.73 MIL/uL (ref 3.87–5.11)
RDW: 14.5 % (ref 11.5–15.5)
WBC: 5.7 10*3/uL (ref 4.0–10.5)

## 2018-09-18 LAB — HEPATIC FUNCTION PANEL
ALBUMIN: 3.4 g/dL — AB (ref 3.5–5.0)
ALK PHOS: 111 U/L (ref 38–126)
ALT: 15 U/L (ref 0–44)
AST: 21 U/L (ref 15–41)
Bilirubin, Direct: 0.1 mg/dL (ref 0.0–0.2)
Indirect Bilirubin: 0.6 mg/dL (ref 0.3–0.9)
TOTAL PROTEIN: 6.4 g/dL — AB (ref 6.5–8.1)
Total Bilirubin: 0.7 mg/dL (ref 0.3–1.2)

## 2018-09-24 DIAGNOSIS — B351 Tinea unguium: Secondary | ICD-10-CM | POA: Diagnosis not present

## 2018-09-24 DIAGNOSIS — I739 Peripheral vascular disease, unspecified: Secondary | ICD-10-CM | POA: Diagnosis not present

## 2018-09-24 DIAGNOSIS — L603 Nail dystrophy: Secondary | ICD-10-CM | POA: Diagnosis not present

## 2018-10-14 ENCOUNTER — Other Ambulatory Visit: Payer: Self-pay

## 2018-10-14 DIAGNOSIS — M47812 Spondylosis without myelopathy or radiculopathy, cervical region: Secondary | ICD-10-CM | POA: Diagnosis not present

## 2018-10-14 DIAGNOSIS — M5030 Other cervical disc degeneration, unspecified cervical region: Secondary | ICD-10-CM | POA: Diagnosis not present

## 2018-10-14 MED ORDER — TRAMADOL HCL 50 MG PO TABS: 50.0000 mg | ORAL_TABLET | Freq: Three times a day (TID) | ORAL | 0 refills | Status: DC

## 2018-10-14 NOTE — Telephone Encounter (Signed)
RX Fax for [facility]@ [phone]  

## 2018-10-21 ENCOUNTER — Ambulatory Visit (INDEPENDENT_AMBULATORY_CARE_PROVIDER_SITE_OTHER): Payer: Medicare Other | Admitting: *Deleted

## 2018-10-21 DIAGNOSIS — I442 Atrioventricular block, complete: Secondary | ICD-10-CM

## 2018-10-21 NOTE — Progress Notes (Signed)
Remote pacemaker transmission.   

## 2018-10-27 ENCOUNTER — Encounter: Payer: Self-pay | Admitting: Internal Medicine

## 2018-10-27 ENCOUNTER — Non-Acute Institutional Stay (SKILLED_NURSING_FACILITY): Payer: Medicare Other | Admitting: Internal Medicine

## 2018-10-27 DIAGNOSIS — J449 Chronic obstructive pulmonary disease, unspecified: Secondary | ICD-10-CM | POA: Diagnosis not present

## 2018-10-27 DIAGNOSIS — I4891 Unspecified atrial fibrillation: Secondary | ICD-10-CM | POA: Diagnosis not present

## 2018-10-27 DIAGNOSIS — I5032 Chronic diastolic (congestive) heart failure: Secondary | ICD-10-CM

## 2018-10-27 DIAGNOSIS — R52 Pain, unspecified: Secondary | ICD-10-CM | POA: Diagnosis not present

## 2018-10-27 NOTE — Progress Notes (Signed)
Location:    [facility] Room Number: 156/W Place of Service:  SNF [phone]) Provider:  Freddi Starr, MD  Patient Care Team: Virgie Dad, MD as PCP - General (Internal Medicine) Rolm Baptise as Physician Assistant (Internal Medicine)  Extended Emergency Contact Information Primary Emergency Contact: Adin Hector, [place] [address] [name] of [place] Phone: [phone] Relation: Son Secondary Emergency Contact: [name], [place] [address] [name] of [place] Phone: [phone] Mobile Phone: [phone] Relation: Son  Code Status:  DNR Goals of care: Advanced Directive information Advanced Directives 10/27/2018  Does Patient Have a Medical Advance Directive? Yes  Type of Advance Directive Out of facility DNR (pink MOST or yellow form)  Does patient want to make changes to medical advance directive? No - Patient declined  Copy of [place] in Chart? No - copy requested  Would patient like information on creating a medical advance directive? No - Patient declined  Pre-existing out of facility DNR order (yellow form or pink MOST form) -     Chief Complaint  Patient presents with  . Acute Visit    Pain in lower left side    HPI:  Pt is a 84 y.o. female seen today for an acute visit for complaints of intermittent pain in the lower left side.  Patient is a long-term resident of facility with a history of atrial fibrillation on low-dose Eliquis--status post pacemaker for sick sinus syndrome as well as a history of CVA involving the left MCA and ICA stenosis in addition to diastolic CHF a dilated pancreatic duct as well as hemorrhoids hyperlipidemia hypertension chronic arthritis diffuse right retinal vein occlusion.  She appears to be stable weight is relatively stable at 148 pounds.  She continues to complain of somewhat diffuse pain in her shoulders joints and  legs- says at times she is had some increased pain of her left what appears to be lower thorax-.  She denies any history of trauma she does have tramadol 3 times a day for pain as well as Voltaren gel and Neurontin.  She is not complaining of any nausea or vomiting she says she has a good appetite.     Past Medical History:  Diagnosis Date  . Atrial fibrillation ([place])   . Bradycardia   . Breast nodule 11/15/2014  . Breast pain, left 11/15/2014  . Carotid artery disease ([place])   . Dizziness   . Dyspnea    Previous CPX suggesting possible restrictive physiology, respiratory muscle fatigue, diastolic dysfunction  . Essential hypertension, benign   . Hyperlipidemia   . Oxygen dependent    2 liter  . Pacemaker [place]   . Rib pain on left side 11/15/2014  . Seasonal allergies   . Shingles 05/04/2015  . Stroke ([place])   . Toe fracture, left    left big toe   Past Surgical History:  Procedure Laterality Date  . COLONOSCOPY  02/22/2012   Procedure: COLONOSCOPY;  Surgeon: Rogene Houston, MD;  [facility];  Service: Endoscopy;  Laterality: N/A;  100  . KNEE SURGERY     bilateral  . PACEMAKER IMPLANT N/A 07/17/2017   Procedure: Pacemaker Implant;  Surgeon: Deboraha Sprang, MD;  Location: [facility];  Service: Cardiovascular;  Laterality: N/A;    Allergies  Allergen Reactions  . Sulfur  Swelling and Rash    Outpatient Encounter Medications as of 10/27/2018  Medication Sig  . acetaminophen (TYLENOL) 500 MG tablet Take 1,000 mg by mouth 2 (two) times daily. Max of 3 grams acetaminophen per 24 hrs from all sources.  Marland Kitchen amLODipine (NORVASC) 2.5 MG tablet Take 2.5 mg by mouth daily.  Jearl Klinefelter ELLIPTA 62.5-25 MCG/INH AEPB Inhale 1 Inhaler into the lungs daily.   Marland Kitchen apixaban (ELIQUIS) 2.5 MG TABS tablet Take 1 tablet (2.5 mg total) by mouth 2 (two) times daily. Resume 07/20/17  . artificial tears (LACRILUBE) OINT ophthalmic ointment Place 1 application into both eyes at  bedtime.  Marland Kitchen atorvastatin (LIPITOR) 80 MG tablet Take 1 tablet (80 mg total) by mouth daily at 6 PM.  . cycloSPORINE (RESTASIS) 0.05 % ophthalmic emulsion Place 1 drop into both eyes every 12 (twelve) hours.   . diclofenac sodium (VOLTAREN) 1 % GEL Apply 1 application topically 4 (four) times daily as needed. Add to body where needed  . diltiazem (CARDIZEM CD) 180 MG 24 hr capsule TAKE ONE CAPSULE BY MOUTH ONCE DAILY.  . furosemide (LASIX) 40 MG tablet Take 40 mg by mouth 2 (two) times daily.  Marland Kitchen gabapentin (NEURONTIN) 100 MG capsule Take 100 mg by mouth 2 (two) times daily.  . hydrocortisone (ANUSOL-HC) 2.5 % rectal cream Place 1 application rectally daily.   . Melatonin 5 MG TABS Take 5 mg by mouth at bedtime as needed.  . Multiple Vitamin (MULITIVITAMIN WITH MINERALS) TABS Take 1 tablet by mouth every other day.   . OXYGEN Inhale 2 L into the lungs 2 (two) times daily. 3:15pm to 11:15pm 11:15pm to 07:15am  . pantoprazole (PROTONIX) 40 MG tablet Take 40 mg by mouth daily.  Vladimir Faster Glycol-Propyl Glycol (SYSTANE OP) Place 1 drop into both eyes 3 (three) times daily. For dry eyes  . polyethylene glycol (MIRALAX / GLYCOLAX) packet Take 17 g by mouth daily.  . potassium chloride (K-DUR,KLOR-CON) 10 MEQ tablet Take 20 mEq by mouth daily.  Marland Kitchen PROAIR HFA 108 (90 BASE) MCG/ACT inhaler Inhale 2 puffs into the lungs 3 (three) times daily as needed for wheezing (cough).   . traMADol (ULTRAM) 50 MG tablet Take 1 tablet (50 mg total) by mouth 3 (three) times daily.  . [DISCONTINUED] Albuterol (VENTOLIN IN) Inhale into the lungs.     No facility-administered encounter medications on file as of 10/27/2018.     Review of Systems   General she is not complain of fever chills weight appears to be relatively stable.  Skin does not complain of rashes or itching.  Eyes is not complaining of visual changes beyond baseline she has prescription lenses.  Head nose mouth and throat does not complain of  difficulty swallowing or sore throat at this point.  Respiratory is not complaining of increased shortness of breath beyond baseline she continues on Anoru Ellipta in the morning- and Pro Air 3 times a day as needed.   Cardiac is not complaining of chest pain has chronic lower extremity edema.  GU is not complaining of dysuria.  GI does not really complain of abdominal pain this appears to be more in the thorax area although difficult to fully pinpoint does not complain of nausea or vomiting says she has a good appetite does not complain of constipation or diarrhea.  Musculoskeletal has somewhat diffuse joint complaints as noted above more so over left shoulder left thorax and at times left lower back.  Neurologic does not complain of dizziness  headache or numbness at this point.  Psych does not complain of being overtly depressed does have some history of insomnia appears to be in good spirits however today      Immunization History  Administered Date(s) Administered  . Influenza-Unspecified 10/10/2013, 09/11/2017  . Pneumococcal Conjugate-13 09/13/2017  . Tdap 09/16/2017  . Zoster 12/05/2017   Pertinent  Health Maintenance Due  Topic Date Due  . INFLUENZA VACCINE  07/10/2018  . PNA vac Low Risk Adult (2 of 2 - PPSV23) 09/13/2018  . DEXA SCAN  Discontinued   Fall Risk  09/11/2018 09/02/2017  Falls in the past year? No Yes  Number falls in past yr: - 1  Injury with Fall? - No   Functional Status Survey:    Vitals:   10/27/18 1543  BP: 124/68  Pulse: 81  Resp: 18  Temp: (!) 97.5 F (36.4 C)  TempSrc: Oral  SpO2: 95%  Weight is 148.6 pounds Physical Exam   In general this is a fairly well-nourished elderly female in no distress she appears to be at her baseline sitting comfortably in her wheelchair.  Skin is warm and dry.  Eyes she has prescription lenses visual acuity appears to be baseline.  Oropharynx is clear mucous membranes moist.  Chest is clear to  auscultation with somewhat shallow air entry there is no labored breathing.  Heart is regular rate and rhythm with a baseline systolic murmur she has bilateral edema left more than right and this is a not new.  Abdomen is soft does not really appear to be tender although she has somewhat diffuse complaints of tenderness but this appears more to the invasive maneuver rather than acute tenderness.  Musculoskeletal- has limited range of motion of her shoulders bilaterally has some tenderness to palpation of the lower left thorax to back area- I did not really note any deformities.  When I asked her to twist to the right and left she did not experience pain.  Neurologic she continues with expressive aphasia at baseline she is alert.  Psych she is largely alert and oriented pleasant appropriate at baseline  Labs reviewed: Recent Labs    06/18/18 0715 08/08/18 0700 09/18/18 0700  NA 142 142 142  K 4.1 3.6 3.9  CL 106 101 106  CO2 28 31 25   GLUCOSE 88 101* 94  BUN 28* 29* 27*  CREATININE 1.00 1.09* 1.07*  CALCIUM 8.6* 9.0 8.5*   Recent Labs    04/29/18 0711 08/08/18 0700 09/18/18 0700  AST 22 24 21   ALT 18 18 15   ALKPHOS 135* 130* 111  BILITOT 0.8 0.7 0.7  PROT 6.9 7.3 6.4*  ALBUMIN 3.8 3.8 3.4*   Recent Labs    02/06/18 0630 04/02/18 0920 06/18/18 0715 09/18/18 0700  WBC 6.6 8.7 6.0 5.7  NEUTROABS 3.7 5.8 3.3  --   HGB 13.6 13.4 12.2 12.9  HCT 43.4 42.8 38.1 41.7  MCV 87.3 89.2 88.0 88.2  PLT 219 208 200 185   Lab Results  Component Value Date   TSH 1.446 07/15/2017   Lab Results  Component Value Date   HGBA1C 5.5 12/06/2016   Lab Results  Component Value Date   CHOL 113 12/07/2017   HDL 56 12/07/2017   LDLCALC 48 12/07/2017   TRIG 47 12/07/2017   CHOLHDL 2.0 12/07/2017    Significant Diagnostic Results in last 30 days:  No results found.  Assessment/Plan  #1 history of left thorax question abdomen to back pain- this is  somewhat hard to  pinpoint-appears to be intermittent she says she is been having this for several days up to a month ago-  Will order updated liver function tests as well as a lipase and amylase with a history of back-- thorax pain in the past she is been followed by GI as suspect this may well be within normal limits but would like to rule out any abnormality in this regards.  She does not report any recent trauma and again the pain appears to be fairly intermittent will withhold doing x-rays of the areas at this point but if persists consider obtaining these  #2 history of chronic diastolic CHF weight appears to be relatively stable she continues on Lasix 40 mg twice daily as well as potassium supplementation will update a metabolic panel to ensure stability of renal function which has been pretty consistently stable recently.  3.  History of pacemaker insertion with sinus node dysfunction she is been followed by cardiology and they have done pacemaker checks which have been stable.  4.  History of atrial fibrillation rate controlled on diltiazem she is on low-dose Eliquis she has a history of GI bleed so the dose has been kept lower.  5.  History of CVA with mild dysphasia she appears to be stable on the Eliquis aspirin is been held because of her GI bleed she is also on a statin.  6.  History of internal carotid artery stenosis this is bilateral she is on Eliquis son deviously has expressed desires not to be aggressive in this regard.  7.  History of dilated pancreatic duct with elevated alkaline phosphatase again will update labs here she has been followed by GI ultrasound was negative for anything acute-it was thought there was no need for aggressive work-up at this time she did have an ultrasound done a year ago which showed enlargement of the common bile duct in setting of a remote cholecystectomy-stable to may be mildly progress per CT done in July 2018--- of doubtful clinical significance.   #8 history  of diffuse pain issues she is on tramadol 50 mg 3 times daily in addition to Neurontin and topical Voltaren gel there has been no recent history of trauma per patient-  #9 history of COPD continues on Anuro Ellipta--she also has [employer] 3 times daily as needed she says she would probably like this occasionally will discuss this with nursing about encouraging it   [phone]

## 2018-10-27 NOTE — Progress Notes (Signed)
This encounter was created in error - please disregard.

## 2018-10-28 ENCOUNTER — Encounter (HOSPITAL_COMMUNITY)
Admission: RE | Admit: 2018-10-28 | Discharge: 2018-10-28 | Disposition: A | Discharge status: Home / Self Care | Payer: Medicare Other | Source: Skilled Nursing Facility | Attending: Internal Medicine | Admitting: Internal Medicine

## 2018-10-28 DIAGNOSIS — I110 Hypertensive heart disease with heart failure: Secondary | ICD-10-CM | POA: Diagnosis not present

## 2018-10-28 DIAGNOSIS — I509 Heart failure, unspecified: Secondary | ICD-10-CM | POA: Insufficient documentation

## 2018-10-28 LAB — COMPREHENSIVE METABOLIC PANEL
ALK PHOS: 115 U/L (ref 38–126)
ALT: 20 U/L (ref 0–44)
AST: 23 U/L (ref 15–41)
Albumin: 3.6 g/dL (ref 3.5–5.0)
Anion gap: 10 (ref 5–15)
BUN: 23 mg/dL (ref 8–23)
CALCIUM: 8.5 mg/dL — AB (ref 8.9–10.3)
CHLORIDE: 106 mmol/L (ref 98–111)
CO2: 25 mmol/L (ref 22–32)
CREATININE: 1.07 mg/dL — AB (ref 0.44–1.00)
GFR calc Af Amer: 49 mL/min — ABNORMAL LOW (ref >60)
GFR, EST NON AFRICAN AMERICAN: 42 mL/min — AB (ref >60)
Glucose, Bld: 92 mg/dL (ref 70–99)
Potassium: 3.9 mmol/L (ref 3.5–5.1)
SODIUM: 141 mmol/L (ref 135–145)
Total Bilirubin: 1.1 mg/dL (ref 0.3–1.2)
Total Protein: 7 g/dL (ref 6.5–8.1)

## 2018-10-28 LAB — LIPASE, BLOOD: Lipase: 27 U/L (ref 11–51)

## 2018-10-28 LAB — AMYLASE: Amylase: 66 U/L (ref 28–100)

## 2018-11-10 ENCOUNTER — Other Ambulatory Visit: Payer: Medicare Other

## 2018-11-11 ENCOUNTER — Other Ambulatory Visit: Payer: Self-pay

## 2018-11-11 MED ORDER — TRAMADOL HCL 50 MG PO TABS: 50.0000 mg | ORAL_TABLET | Freq: Three times a day (TID) | ORAL | 0 refills | Status: DC

## 2018-11-11 NOTE — Telephone Encounter (Signed)
RX Fax for [facility]@ [phone]  

## 2018-11-13 ENCOUNTER — Encounter: Payer: Self-pay | Admitting: Internal Medicine

## 2018-11-13 ENCOUNTER — Non-Acute Institutional Stay (SKILLED_NURSING_FACILITY): Payer: Medicare Other | Admitting: Internal Medicine

## 2018-11-13 DIAGNOSIS — J449 Chronic obstructive pulmonary disease, unspecified: Secondary | ICD-10-CM

## 2018-11-13 DIAGNOSIS — I5033 Acute on chronic diastolic (congestive) heart failure: Secondary | ICD-10-CM | POA: Diagnosis not present

## 2018-11-13 DIAGNOSIS — I10 Essential (primary) hypertension: Secondary | ICD-10-CM

## 2018-11-13 DIAGNOSIS — R234 Changes in skin texture: Secondary | ICD-10-CM

## 2018-11-13 NOTE — Progress Notes (Signed)
Location:    [facility] Room Number: 156/W Place of Service:  SNF [phone]) Provider:  Freddi Starr, MD  Patient Care Team: Virgie Dad, MD as PCP - General (Internal Medicine) Rolm Baptise as Physician Assistant (Internal Medicine)  Extended Emergency Contact Information Primary Emergency Contact: Adin Hector, [place] [address] [name] of [place] Phone: [phone] Relation: Son Secondary Emergency Contact: [name],  [address] [name] of [place] Phone: [phone] Mobile Phone: [phone] Relation: Son   Code Status:  DNR Goals of care: Advanced Directive information Advanced Directives 11/13/2018  Does Patient Have a Medical Advance Directive? Yes  Type of Advance Directive Out of facility DNR (pink MOST or yellow form)  Does patient want to make changes to medical advance directive? No - Patient declined  Copy of [place] in Chart? No - copy requested  Would patient like information on creating a medical advance directive? No - Patient declined  Pre-existing out of facility DNR order (yellow form or pink MOST form) -    Chief complaint acute visit secondary to increased pedal edema  HPI:  Pt is a 84 y.o. female seen today for an acute visit for of increased pedal edema.  Patient has a history of numerous medical issues including diastolic CHF on Lasix as well as a history of CVA with expressive aphasia sick sinus syndrome status post pacemaker atrial fibrillation as well as hypertension chronic arthritis peripheral neuropathy and COPD.  Nursing staff has noted some increased edema of her feet patient herself does not really have any complaints she is not really complaining of any increased shortness of breath from baseline she does have a history of COPD.  Appears she has gained possibly about 5 pounds over the past month.  She is on Lasix 40 mg  twice daily with potassium supplementation.  Vital signs appear to be stable I did get an elevated systolic but she is somewhat reactive and this will have to be monitored [employer] recent systolic in the low 423N but I got 172 on exam today which is not surprising at times this will jump when it is being measured she gets a little bit excited.  She is currently not complaining of any headache dizziness or chest pain or again increased shortness of breath from baseline   Past Medical History:  Diagnosis Date  . Atrial fibrillation ([place])   . Bradycardia   . Breast nodule 11/15/2014  . Breast pain, left 11/15/2014  . Carotid artery disease ([place])   . Dizziness   . Dyspnea    Previous CPX suggesting possible restrictive physiology, respiratory muscle fatigue, diastolic dysfunction  . Essential hypertension, benign   . Hyperlipidemia   . Oxygen dependent    2 liter  . Pacemaker [place]   . Rib pain on left side 11/15/2014  . Seasonal allergies   . Shingles 05/04/2015  . Stroke ([place])   . Toe fracture, left    left big toe   Past Surgical History:  Procedure Laterality Date  . COLONOSCOPY  02/22/2012   Procedure: COLONOSCOPY;  Surgeon: Rogene Houston, MD;  [facility];  Service: Endoscopy;  Laterality: N/A;  100  . KNEE SURGERY     bilateral  . PACEMAKER IMPLANT N/A 07/17/2017   Procedure: Pacemaker Implant;  Surgeon: Deboraha Sprang, MD;  Location: [facility] LAB;  Service: Cardiovascular;  Laterality: N/A;    Allergies  Allergen Reactions  . Sulfur Swelling and Rash    Outpatient Encounter Medications as of 11/13/2018  Medication Sig  . acetaminophen (TYLENOL) 500 MG tablet Take 1,000 mg by mouth 2 (two) times daily. Max of 3 grams acetaminophen per 24 hrs from all sources.  Marland Kitchen amLODipine (NORVASC) 2.5 MG tablet Take 2.5 mg by mouth daily.  Jearl Klinefelter ELLIPTA 62.5-25 MCG/INH AEPB Inhale 1 Inhaler into the lungs daily.   Marland Kitchen apixaban (ELIQUIS) 2.5 MG TABS tablet Take 1  tablet (2.5 mg total) by mouth 2 (two) times daily. Resume 07/20/17  . artificial tears (LACRILUBE) OINT ophthalmic ointment Place 1 application into both eyes at bedtime.  Marland Kitchen atorvastatin (LIPITOR) 80 MG tablet Take 1 tablet (80 mg total) by mouth daily at 6 PM.  . cycloSPORINE (RESTASIS) 0.05 % ophthalmic emulsion Place 1 drop into both eyes every 12 (twelve) hours.   . diclofenac sodium (VOLTAREN) 1 % GEL Apply 1 application topically 4 (four) times daily as needed. Add to body where needed  . diltiazem (CARDIZEM CD) 180 MG 24 hr capsule TAKE ONE CAPSULE BY MOUTH ONCE DAILY.  . furosemide (LASIX) 40 MG tablet Take 40 mg by mouth 2 (two) times daily.  Marland Kitchen gabapentin (NEURONTIN) 100 MG capsule Take 100 mg by mouth 2 (two) times daily.  . hydrocortisone (ANUSOL-HC) 2.5 % rectal cream Place 1 application rectally daily.   . Melatonin 5 MG TABS Take 5 mg by mouth at bedtime as needed.  . Multiple Vitamin (MULITIVITAMIN WITH MINERALS) TABS Take 1 tablet by mouth every other day.   . OXYGEN Inhale 2 L into the lungs 2 (two) times daily. 3:15pm to 11:15pm 11:15pm to 07:15am  . pantoprazole (PROTONIX) 40 MG tablet Take 40 mg by mouth daily.  Vladimir Faster Glycol-Propyl Glycol (SYSTANE OP) Place 1 drop into both eyes 3 (three) times daily. For dry eyes  . polyethylene glycol (MIRALAX / GLYCOLAX) packet Take 17 g by mouth daily.  . potassium chloride (K-DUR,KLOR-CON) 10 MEQ tablet Take 20 mEq by mouth daily.  Marland Kitchen PROAIR HFA 108 (90 BASE) MCG/ACT inhaler Inhale 2 puffs into the lungs 3 (three) times daily as needed for wheezing (cough).   . traMADol (ULTRAM) 50 MG tablet Take 1 tablet (50 mg total) by mouth 3 (three) times daily.  . [DISCONTINUED] Albuterol (VENTOLIN IN) Inhale into the lungs.     No facility-administered encounter medications on file as of 11/13/2018.     Review of Systems   General she is not complaining of fever chills appears she is gained about 5 pounds over the past month.  Skin does  not complain of rashes or itching appears on her right great toe she does have a small abrasion scab- head ears eyes nose mouth and throat is not complaining of sore throat or visual changes from baseline.  Respiratory does have a history of COPD does not complain of increased shortness of breath or cough from baseline.  Cardiac does not complain of chest pain she does have lower extremity edema appears to be somewhat increased more so of her feet.  GI is not complaining of abdominal pain nausea vomiting diarrhea constipation.  GU is not complaining of dysuria.  Musculoskeletal does have somewhat chronic complaints of joint pain more so of her knees but is not really complaining of that today.  Neurologic is not complaining of dizziness headache or syncope.  And  psych appears little bit anxious with exam does not complain of being depressed appears to be in relatively good spirits  Immunization History  Administered Date(s) Administered  . Influenza-Unspecified 10/10/2013, 09/11/2017  . Pneumococcal Conjugate-13 09/13/2017  . Tdap 09/16/2017  . Zoster 12/05/2017   Pertinent  Health Maintenance Due  Topic Date Due  . INFLUENZA VACCINE  Completed  . PNA vac Low Risk Adult  Completed  . DEXA SCAN  Discontinued   Fall Risk  09/11/2018 09/02/2017  Falls in the past year? No Yes  Number falls in past yr: - 1  Injury with Fall? - No   Functional Status Survey:    Vitals:   11/13/18 1301  BP: (!) 143/63  Pulse: 60  Resp: (!) 21  Temp: 98.3 F (36.8 C)  TempSrc: Oral  SpO2: 98%  Manual blood pressure was 172/80- weight is 153.6 last month was 148.6  Physical Exam   In general this is a pleasant elderly female in no distress sitting comfortably in a wheelchair.  Her skin is warm and dry.  On the medial aspect of her right great toe there is a small scab with a minimal amount of erythema and minimal tenderness   Eyes visual acuity appears grossly intact she has prescription  lenses.  Oropharynx is clear mucous membranes moist.  Chest is clear to auscultation with somewhat shallow air entry there is no labored breathing.  Heart is regular rate and rhythm with a baseline systolic murmur she has 2+ lower extremity edema somewhat increased pedal edema from baseline.  This is cool to touch non-erythematous nontender.  Abdomen is soft nontender with positive bowel sounds.  Musculoskeletal moves all extremities x4 at baseline has arthritic changes of her knees has limited range of motion at the shoulders at baseline.  Neurologic is grossly intact she does have expressive aphasia.  And psych she is alert and oriented pleasant and appropriate  Labs reviewed: Recent Labs    08/08/18 0700 09/18/18 0700 10/28/18 0750  NA 142 142 141  K 3.6 3.9 3.9  CL 101 106 106  CO2 31 25 25   GLUCOSE 101* 94 92  BUN 29* 27* 23  CREATININE 1.09* 1.07* 1.07*  CALCIUM 9.0 8.5* 8.5*   Recent Labs    08/08/18 0700 09/18/18 0700 10/28/18 0750  AST 24 21 23   ALT 18 15 20   ALKPHOS 130* 111 115  BILITOT 0.7 0.7 1.1  PROT 7.3 6.4* 7.0  ALBUMIN 3.8 3.4* 3.6   Recent Labs    02/06/18 0630 04/02/18 0920 06/18/18 0715 09/18/18 0700  WBC 6.6 8.7 6.0 5.7  NEUTROABS 3.7 5.8 3.3  --   HGB 13.6 13.4 12.2 12.9  HCT 43.4 42.8 38.1 41.7  MCV 87.3 89.2 88.0 88.2  PLT 219 208 200 185   Lab Results  Component Value Date   TSH 1.446 07/15/2017   Lab Results  Component Value Date   HGBA1C 5.5 12/06/2016   Lab Results  Component Value Date   CHOL 113 12/07/2017   HDL 56 12/07/2017   LDLCALC 48 12/07/2017   TRIG 47 12/07/2017   CHOLHDL 2.0 12/07/2017    Significant Diagnostic Results in last 30 days:  No results found.  Assessment/Plan  #1- history of edema with history of diastolic CHF she is on Lasix 40 mg twice daily with potassium she has gained a small amount of weight over the past month will increase her Lasix for short.  Will increase Lasix to 60 mg every  morning 40 mg every afternoon for 4 days then decrease back to baseline of 40 mg twice daily on day 5 and thereafter.  Also increase her potassium to 20 mEq twice daily for 5 days and then decrease back to 20 mEq a day and obtain a BMP tomorrow to check on her electrolytes and renal function also will need a BMP in approximately a week to ensure stability.  2.  History of small right great toe lesion---scab-- this does not appear to be infected but will have to be watched will write an order for a topical antibiotic and monitor for any increased erythema and notify provider if changes.  3.  History of COPD this appears stable on an Ellipta--- she does states she would like her pro-air routinely during the day so will make an order for Prolia routinely daily and continue as needed for 2 additional doses if needed.  3.  History of hypertension she appears to have somewhat variable systolics would like to get more readings before making any medication changes she is on diltiazem 180 mg a day as well as Norvasc 2.5 mg a day--we will order blood pressures twice daily with a log for provider review.  4.  Atrial fibrillation this appears rate controlled on diltiazem she is on low-dose Eliquis for anticoagulation.  5.  History of chronic arthritis she is on tramadol 3 times daily and Voltaren topical  #6 history of sick sinus syndrome she continues with a pacemaker this is followed by cardiology.  7.  History of CVA she continues with expressive aphasia but has done well with supportive care continues on Eliquis and a statin.  [id number]

## 2018-11-14 ENCOUNTER — Encounter (HOSPITAL_COMMUNITY)
Admission: RE | Admit: 2018-11-14 | Discharge: 2018-11-14 | Disposition: A | Discharge status: Home / Self Care | Payer: Medicare Other | Source: Skilled Nursing Facility | Attending: Internal Medicine | Admitting: Internal Medicine

## 2018-11-14 DIAGNOSIS — I63312 Cerebral infarction due to thrombosis of left middle cerebral artery: Secondary | ICD-10-CM | POA: Insufficient documentation

## 2018-11-14 DIAGNOSIS — I110 Hypertensive heart disease with heart failure: Secondary | ICD-10-CM | POA: Diagnosis not present

## 2018-11-14 DIAGNOSIS — J449 Chronic obstructive pulmonary disease, unspecified: Secondary | ICD-10-CM | POA: Diagnosis not present

## 2018-11-14 DIAGNOSIS — I4821 Permanent atrial fibrillation: Secondary | ICD-10-CM | POA: Insufficient documentation

## 2018-11-14 LAB — BASIC METABOLIC PANEL
Anion gap: 10 (ref 5–15)
BUN: 22 mg/dL (ref 8–23)
CALCIUM: 8.9 mg/dL (ref 8.9–10.3)
CO2: 26 mmol/L (ref 22–32)
Chloride: 106 mmol/L (ref 98–111)
Creatinine, Ser: 0.98 mg/dL (ref 0.44–1.00)
GFR calc Af Amer: 56 mL/min — ABNORMAL LOW (ref >60)
GFR, EST NON AFRICAN AMERICAN: 48 mL/min — AB (ref >60)
GLUCOSE: 101 mg/dL — AB (ref 70–99)
POTASSIUM: 4 mmol/L (ref 3.5–5.1)
Sodium: 142 mmol/L (ref 135–145)

## 2018-11-21 ENCOUNTER — Encounter (HOSPITAL_COMMUNITY)
Admission: RE | Admit: 2018-11-21 | Discharge: 2018-11-21 | Disposition: A | Discharge status: Home / Self Care | Payer: Medicare Other | Source: Skilled Nursing Facility | Attending: Internal Medicine | Admitting: Internal Medicine

## 2018-11-21 DIAGNOSIS — I63312 Cerebral infarction due to thrombosis of left middle cerebral artery: Secondary | ICD-10-CM | POA: Insufficient documentation

## 2018-11-21 DIAGNOSIS — J449 Chronic obstructive pulmonary disease, unspecified: Secondary | ICD-10-CM | POA: Insufficient documentation

## 2018-11-21 DIAGNOSIS — I110 Hypertensive heart disease with heart failure: Secondary | ICD-10-CM | POA: Diagnosis not present

## 2018-11-21 LAB — BASIC METABOLIC PANEL
ANION GAP: 12 (ref 5–15)
BUN: 30 mg/dL — ABNORMAL HIGH (ref 8–23)
CO2: 26 mmol/L (ref 22–32)
Calcium: 8.9 mg/dL (ref 8.9–10.3)
Chloride: 104 mmol/L (ref 98–111)
Creatinine, Ser: 1.2 mg/dL — ABNORMAL HIGH (ref 0.44–1.00)
GFR calc Af Amer: 44 mL/min — ABNORMAL LOW (ref >60)
GFR calc non Af Amer: 38 mL/min — ABNORMAL LOW (ref >60)
GLUCOSE: 94 mg/dL (ref 70–99)
Potassium: 3.9 mmol/L (ref 3.5–5.1)
Sodium: 142 mmol/L (ref 135–145)

## 2018-11-27 ENCOUNTER — Non-Acute Institutional Stay (SKILLED_NURSING_FACILITY): Payer: Medicare Other | Admitting: Internal Medicine

## 2018-11-27 ENCOUNTER — Encounter: Payer: Self-pay | Admitting: Internal Medicine

## 2018-11-27 DIAGNOSIS — I5032 Chronic diastolic (congestive) heart failure: Secondary | ICD-10-CM | POA: Diagnosis not present

## 2018-11-27 DIAGNOSIS — J449 Chronic obstructive pulmonary disease, unspecified: Secondary | ICD-10-CM | POA: Diagnosis not present

## 2018-11-27 DIAGNOSIS — I10 Essential (primary) hypertension: Secondary | ICD-10-CM | POA: Diagnosis not present

## 2018-11-27 DIAGNOSIS — I639 Cerebral infarction, unspecified: Secondary | ICD-10-CM | POA: Diagnosis not present

## 2018-11-27 DIAGNOSIS — M150 Primary generalized (osteo)arthritis: Secondary | ICD-10-CM

## 2018-11-27 DIAGNOSIS — M159 Polyosteoarthritis, unspecified: Secondary | ICD-10-CM

## 2018-11-27 DIAGNOSIS — I4891 Unspecified atrial fibrillation: Secondary | ICD-10-CM

## 2018-11-27 NOTE — Progress Notes (Signed)
Location:    [facility] Room Number: 156/W Place of Service:  SNF [phone]) Provider: Granville Lewis PA-C  Virgie Dad, MD  Patient Care Team: Virgie Dad, MD as PCP - General (Internal Medicine) Rolm Baptise as Physician Assistant (Internal Medicine)  Extended Emergency Contact Information Primary Emergency Contact: Adin Hector, [place] [address] [name] of [place] Phone: [phone] Relation: Son Secondary Emergency Contact: [name], [place] [address] [name] of [place] Phone: [phone] Mobile Phone: [phone] Relation: Son  Code Status: DNR Goals of care: Advanced Directive information Advanced Directives 11/13/2018  Does Patient Have a Medical Advance Directive? Yes  Type of Advance Directive Out of facility DNR (pink MOST or yellow form)  Does patient want to make changes to medical advance directive? No - Patient declined  Copy of [place] in Chart? No - copy requested  Would patient like information on creating a medical advance directive? No - Patient declined  Pre-existing out of facility DNR order (yellow form or pink MOST form) -     Chief Complaint  Patient presents with  . Medical Management of Chronic Issues    Routine visit of medical management  Medical management of diastolic CHF with edema- COPD-atrial fibrillation as well as neuropathy insomnia hypertension osteoarthritis history of CVA with expressive aphasia and carotid artery occlusion.    HPI:  Pt is a 84 y.o. female seen today for medical management of chronic diseases.  As noted above she was just seen recently for some possible weight gain with increased pedal edema-and we did increase her Lasix slightly for a few days and then titrated up back down to her baseline dose of 40 mg twice daily-her weight appears to have moderated it was in the lower mid 150s on most recent weight it was down to  148.  Edema appears to be somewhat improved she is not complaining of any chest pain or shortness of breath.  Her other medical issues appear to be relatively stable she does have a history of COPD she continues on Ellipta as well as pro-air nightly and twice daily PRN she does not really complain of shortness of breath wheezing or congestion.  For atrial fibrillation she continues on diltiazem and Eliquis this appears to be rate controlled.  She also has a history of hypertension she is on the diuretic Lasix as well as diltiazem and Norvasc low-dose- at times especially when I measuring it she gets a bit anxious and blood pressure goes up a bit I got 152/66 per chart review it appears she runs more in the lower 188C systolically at this point will monitor since I feel anxiety is playing a role in some of the systolic elevations but this will have to be watched.  She does have a history of osteoarthritic pain especially of her knees but she is on tramadol 3 times daily and has Voltaren gel for various joints and this appears to help.  Regards to sick sinus syndrome she does have a pacemaker and this is followed closely by cardiology.  In regards to CVA she does have expressive aphasia but does very well with supportive care and is able to communicate quite effectively she does continue on Eliquis and a statin.  She also has bilateral carotid artery occlusions continues on Eliquis family has opted for no aggressive intervention.  At one  point she did have significant hemorrhoid pain she is on topical treatment at one point banding was considered but secondary to her age and Eliquis use this was thought to be somewhat too risky if symptoms could be controlled otherwise  Currently she is sitting in her wheelchair comfortably appears to in good spirits and looking forward to the holidays   Past Medical History:  Diagnosis Date  . Atrial fibrillation ([place])   . Bradycardia   . Breast nodule  11/15/2014  . Breast pain, left 11/15/2014  . Carotid artery disease ([place])   . Dizziness   . Dyspnea    Previous CPX suggesting possible restrictive physiology, respiratory muscle fatigue, diastolic dysfunction  . Essential hypertension, benign   . Hyperlipidemia   . Oxygen dependent    2 liter  . Pacemaker [place]   . Rib pain on left side 11/15/2014  . Seasonal allergies   . Shingles 05/04/2015  . Stroke ([place])   . Toe fracture, left    left big toe   Past Surgical History:  Procedure Laterality Date  . COLONOSCOPY  02/22/2012   Procedure: COLONOSCOPY;  Surgeon: Rogene Houston, MD;  [facility];  Service: Endoscopy;  Laterality: N/A;  100  . KNEE SURGERY     bilateral  . PACEMAKER IMPLANT N/A 07/17/2017   Procedure: Pacemaker Implant;  Surgeon: Deboraha Sprang, MD;  Location: [facility];  Service: Cardiovascular;  Laterality: N/A;    Allergies  Allergen Reactions  . Sulfur Swelling and Rash    Outpatient Encounter Medications as of 11/27/2018  Medication Sig  . acetaminophen (TYLENOL) 500 MG tablet Take 1,000 mg by mouth 2 (two) times daily. Max of 3 grams acetaminophen per 24 hrs from all sources.  Marland Kitchen amLODipine (NORVASC) 2.5 MG tablet Take 2.5 mg by mouth daily.  Jearl Klinefelter ELLIPTA 62.5-25 MCG/INH AEPB Inhale 1 Inhaler into the lungs daily.   Marland Kitchen apixaban (ELIQUIS) 2.5 MG TABS tablet Take 1 tablet (2.5 mg total) by mouth 2 (two) times daily. Resume 07/20/17  . artificial tears (LACRILUBE) OINT ophthalmic ointment Place 1 application into both eyes at bedtime.  Marland Kitchen atorvastatin (LIPITOR) 80 MG tablet Take 1 tablet (80 mg total) by mouth daily at 6 PM.  . cycloSPORINE (RESTASIS) 0.05 % ophthalmic emulsion Place 1 drop into both eyes every 12 (twelve) hours.   . diclofenac sodium (VOLTAREN) 1 % GEL Apply 1 application topically 4 (four) times daily as needed. Add to body where needed  . diltiazem (CARDIZEM CD) 180 MG 24 hr capsule TAKE ONE CAPSULE BY MOUTH ONCE DAILY.   . furosemide (LASIX) 40 MG tablet Take 40 mg by mouth 2 (two) times daily.  Marland Kitchen gabapentin (NEURONTIN) 100 MG capsule Take 100 mg by mouth 2 (two) times daily.  . hydrocortisone (ANUSOL-HC) 2.5 % rectal cream Place 1 application rectally daily.   . Melatonin 5 MG TABS Take 5 mg by mouth at bedtime as needed.  . Multiple Vitamin (MULITIVITAMIN WITH MINERALS) TABS Take 1 tablet by mouth every other day.   . OXYGEN Inhale 2 L into the lungs 2 (two) times daily. 3:15pm to 11:15pm 11:15pm to 07:15am  . pantoprazole (PROTONIX) 40 MG tablet Take 40 mg by mouth daily.  Vladimir Faster Glycol-Propyl Glycol (SYSTANE OP) Place 1 drop into both eyes 3 (three) times daily. For dry eyes  . polyethylene glycol (MIRALAX / GLYCOLAX) packet Take 17 g by mouth daily.  . potassium chloride (K-DUR,KLOR-CON) 10 MEQ tablet  Take 20 mEq by mouth daily.  Marland Kitchen PROAIR HFA 108 (90 BASE) MCG/ACT inhaler Inhale 2 puffs into the lungs 3 (three) times daily as needed for wheezing (cough).   . traMADol (ULTRAM) 50 MG tablet Take 1 tablet (50 mg total) by mouth 3 (three) times daily.  . [DISCONTINUED] Albuterol (VENTOLIN IN) Inhale into the lungs.     No facility-administered encounter medications on file as of 11/27/2018.      Review of Systems General she not complaining of fever chills her weight appears to be stabilizing somewhat in the higher 140s.  Skin is not complain of rashes or itching she does have a scab on her right great toe that appears fairly benign at this point with no progression.  Head ears eyes nose mouth and throat is not complaining of any sore throat or visual changes from baseline she has prescription lenses.  Respiratory is not complaining of shortness of breath or cough at this time.  Cardiac does not complain of chest pain edema appears to be moderated somewhat in her feet.  GI is not complaining at this time of abdominal pain nausea vomiting diarrhea constipation at times will complain of some short  intermittent pain left side of her abdomen but not complaining of that this morning.  GU is not complaining of dysuria.  Musculoskeletal does have a history of chronic arthritic pain especially of her knees but does not really complain of that today.  Neurologic does not complain of dizziness headache or syncope.  And psych appears to be in good spirits does not really appear to be depressed slightly anxious but this occurs usually when I visit with her and I take her blood pressure.   Immunization History  Administered Date(s) Administered  . Influenza-Unspecified 10/10/2013, 09/11/2017  . Pneumococcal Conjugate-13 09/13/2017  . Tdap 09/16/2017  . Zoster 12/05/2017   Pertinent  Health Maintenance Due  Topic Date Due  . INFLUENZA VACCINE  Completed  . PNA vac Low Risk Adult  Completed  . DEXA SCAN  Discontinued   Fall Risk  09/11/2018 09/02/2017  Falls in the past year? No Yes  Number falls in past yr: - 1  Injury with Fall? - No   Functional Status Survey:    Vitals:   11/27/18 1159  BP: (!) 142/74  Pulse: 79  Resp: 18  Temp: 97.6 F (36.4 C)  TempSrc: Oral  SpO2: 97%  Weight: 148 lb (67.1 kg)  Height: 5\' 4"  (1.626 m)  Again manual reading blood pressure was 152/66 thought to have an anxiety component Body mass index is 25.4 kg/m. Physical Exam   In general this is a pleasant elderly female in no distress sitting comfortably in her wheelchair she is bright and alert.  Her skin is warm and dry she does have a small scab on the right great toe there is no surrounding erythema or tenderness or drainage or sign of infection.  Eyes visual acuity appears to be intact she has prescription lenses sclera and conjunctive are clear.  Oropharynx clear mucous membranes moist.  Chest is clear to auscultation there is no labored breathing.  Heart is regular irregular rate and rhythm with a slight systolic murmur she does have baseline lower extremity edema although this  appears possibly slightly reduced from previous exam.  Abdomen is soft nontender with positive bowel sounds.  Musculoskeletal does move all extremities x4 ambulates in wheelchair continues to have some lower extremity weakness with arthritic changes of her knees bilaterally.  Neurologic is grossly intact she does have expressive aphasia she is alert could not really appreciate true lateralizing findings.  Psych she is largely alert and oriented pleasant and appropriate was able to talk today about how many children she has and how many great grandchildren as well as grandchildren she has and appear to be quite accurate.    Labs reviewed: Recent Labs    10/28/18 0750 11/14/18 0758 11/21/18 0700  NA 141 142 142  K 3.9 4.0 3.9  CL 106 106 104  CO2 25 26 26   GLUCOSE 92 101* 94  BUN 23 22 30*  CREATININE 1.07* 0.98 1.20*  CALCIUM 8.5* 8.9 8.9   Recent Labs    08/08/18 0700 09/18/18 0700 10/28/18 0750  AST 24 21 23   ALT 18 15 20   ALKPHOS 130* 111 115  BILITOT 0.7 0.7 1.1  PROT 7.3 6.4* 7.0  ALBUMIN 3.8 3.4* 3.6   Recent Labs    02/06/18 0630 04/02/18 0920 06/18/18 0715 09/18/18 0700  WBC 6.6 8.7 6.0 5.7  NEUTROABS 3.7 5.8 3.3  --   HGB 13.6 13.4 12.2 12.9  HCT 43.4 42.8 38.1 41.7  MCV 87.3 89.2 88.0 88.2  PLT 219 208 200 185   Lab Results  Component Value Date   TSH 1.446 07/15/2017   Lab Results  Component Value Date   HGBA1C 5.5 12/06/2016   Lab Results  Component Value Date   CHOL 113 12/07/2017   HDL 56 12/07/2017   LDLCALC 48 12/07/2017   TRIG 47 12/07/2017   CHOLHDL 2.0 12/07/2017    Significant Diagnostic Results in last 30 days:  No results found.  Assessment/Plan  #[id number] of diastolic CHF with increased edema-her weight appears to be slightly trending down she is now back on her baseline dose of Lasix 40 mg twice daily at this point will continue to monitor she is also on potassium supplementation her recent lab appeared to be  unremarkable will update a BMP next week to ensure stability.  2.  History of COPD this appears stable on Ellipta as well as pro-air she is not complaining of wheezing or shortness of breath.  3.  Atrial fibrillation appears rate controlled on diltiazem she is on Eliquis for anticoagulation.  5.  Hypertension as noted above she has somewhat variable systolics but is thought at times anxiety is playing a role in this when her blood pressure is measured at this point will continue the low-dose Norvasc as well as diltiazem.  With her advanced age and fall risk would be a bit cautious secondary to concerns of hypotension but will need follow-up.  6.-History of osteoarthritis this appears to be fairly well controlled on 3 times a day tramadol as well as Voltaren gel topical.  7.  History of sick sinus syndrome this is followed by cardiology she does have a pacemaker and has done well with this it appears.  8.  History of CVA and she does have an expressive aphasia at baseline but does well with supportive care she is on Eliquis as well as a statin long-term.  9.  History of bilateral carotid artery occlusion she is on Eliquis family does not desire aggressive intervention which is understandable with her advanced age and comorbidities.  10.  History of neuropathy she continues on Neurontin twice a day she is not really complaining of numbness or pain in this regards today.  11.  History of insomnia she is on melatonin as needed she is not complaining of  sleeplessness today.  12.-  History of hemorrhoids she does receive topical treatment- there is some concern that any aggressive intervention like banding would be problematic with her advanced age and comorbidities and use of Eliquis as an anticoagulant she is not really complaining of hemorrhoid pain today.  She continues on Anusol-she does also have laxatives including MiraLAX.  13.  History of retinal vein occlusion on the right-as well as  dry eyes she has been followed by ophthalmology she does have numerous topical eyedrops including Systane artificial tears and Restasis.  Visual acuity appears to be at baseline.  [id number]

## 2018-12-02 ENCOUNTER — Other Ambulatory Visit: Payer: Self-pay

## 2018-12-02 MED ORDER — TRAMADOL HCL 50 MG PO TABS: 50.0000 mg | ORAL_TABLET | Freq: Three times a day (TID) | ORAL | 0 refills | Status: DC

## 2018-12-02 NOTE — Telephone Encounter (Signed)
RX Fax for [facility]@ [phone]  

## 2018-12-11 DIAGNOSIS — I69320 Aphasia following cerebral infarction: Secondary | ICD-10-CM | POA: Diagnosis not present

## 2018-12-11 DIAGNOSIS — Z5189 Encounter for other specified aftercare: Secondary | ICD-10-CM | POA: Diagnosis not present

## 2018-12-16 DIAGNOSIS — I69320 Aphasia following cerebral infarction: Secondary | ICD-10-CM | POA: Diagnosis not present

## 2018-12-16 DIAGNOSIS — Z5189 Encounter for other specified aftercare: Secondary | ICD-10-CM | POA: Diagnosis not present

## 2018-12-19 DIAGNOSIS — I69320 Aphasia following cerebral infarction: Secondary | ICD-10-CM | POA: Diagnosis not present

## 2018-12-19 DIAGNOSIS — Z5189 Encounter for other specified aftercare: Secondary | ICD-10-CM | POA: Diagnosis not present

## 2018-12-22 DIAGNOSIS — Z5189 Encounter for other specified aftercare: Secondary | ICD-10-CM | POA: Diagnosis not present

## 2018-12-22 DIAGNOSIS — I69320 Aphasia following cerebral infarction: Secondary | ICD-10-CM | POA: Diagnosis not present

## 2018-12-23 LAB — CUP PACEART REMOTE DEVICE CHECK
Battery Remaining Longevity: 128 mo
Battery Remaining Percentage: 95.5 %
Battery Voltage: 3.01 V
Brady Statistic RV Percent Paced: 99 %
Date Time Interrogation Session: 20191112070013
Implantable Lead Implant Date: 20180808
Implantable Lead Location: 753860
Implantable Lead Model: 1948
Lead Channel Pacing Threshold Pulse Width: 0.5 ms
Lead Channel Sensing Intrinsic Amplitude: 5.1 mV
Lead Channel Setting Pacing Amplitude: 2.5 V
Lead Channel Setting Pacing Pulse Width: 0.5 ms
Lead Channel Setting Sensing Sensitivity: 0.5 mV
MDC IDC MSMT LEADCHNL RV IMPEDANCE VALUE: 630 Ohm
MDC IDC MSMT LEADCHNL RV PACING THRESHOLD AMPLITUDE: 0.5 V
MDC IDC PG IMPLANT DT: 20180808
MDC IDC PG SERIAL: 7957820

## 2018-12-26 DIAGNOSIS — Z5189 Encounter for other specified aftercare: Secondary | ICD-10-CM | POA: Diagnosis not present

## 2018-12-26 DIAGNOSIS — I69320 Aphasia following cerebral infarction: Secondary | ICD-10-CM | POA: Diagnosis not present

## 2018-12-31 DIAGNOSIS — I69320 Aphasia following cerebral infarction: Secondary | ICD-10-CM | POA: Diagnosis not present

## 2018-12-31 DIAGNOSIS — Z5189 Encounter for other specified aftercare: Secondary | ICD-10-CM | POA: Diagnosis not present

## 2019-01-01 DIAGNOSIS — I69320 Aphasia following cerebral infarction: Secondary | ICD-10-CM | POA: Diagnosis not present

## 2019-01-01 DIAGNOSIS — Z5189 Encounter for other specified aftercare: Secondary | ICD-10-CM | POA: Diagnosis not present

## 2019-01-03 DIAGNOSIS — Z5189 Encounter for other specified aftercare: Secondary | ICD-10-CM | POA: Diagnosis not present

## 2019-01-03 DIAGNOSIS — I69320 Aphasia following cerebral infarction: Secondary | ICD-10-CM | POA: Diagnosis not present

## 2019-01-05 DIAGNOSIS — Z5189 Encounter for other specified aftercare: Secondary | ICD-10-CM | POA: Diagnosis not present

## 2019-01-05 DIAGNOSIS — I69320 Aphasia following cerebral infarction: Secondary | ICD-10-CM | POA: Diagnosis not present

## 2019-01-06 ENCOUNTER — Other Ambulatory Visit: Payer: Self-pay

## 2019-01-06 MED ORDER — TRAMADOL HCL 50 MG PO TABS: 50.0000 mg | ORAL_TABLET | Freq: Three times a day (TID) | ORAL | 0 refills | Status: DC

## 2019-01-06 NOTE — Telephone Encounter (Signed)
RX Fax for [facility]@ [phone]  

## 2019-01-08 DIAGNOSIS — Z5189 Encounter for other specified aftercare: Secondary | ICD-10-CM | POA: Diagnosis not present

## 2019-01-08 DIAGNOSIS — I69320 Aphasia following cerebral infarction: Secondary | ICD-10-CM | POA: Diagnosis not present

## 2019-01-09 DIAGNOSIS — Z5189 Encounter for other specified aftercare: Secondary | ICD-10-CM | POA: Diagnosis not present

## 2019-01-09 DIAGNOSIS — I69320 Aphasia following cerebral infarction: Secondary | ICD-10-CM | POA: Diagnosis not present

## 2019-01-19 ENCOUNTER — Non-Acute Institutional Stay (SKILLED_NURSING_FACILITY): Payer: Medicare Other | Admitting: Internal Medicine

## 2019-01-19 ENCOUNTER — Encounter: Payer: Self-pay | Admitting: Internal Medicine

## 2019-01-19 DIAGNOSIS — I10 Essential (primary) hypertension: Secondary | ICD-10-CM

## 2019-01-19 DIAGNOSIS — I5032 Chronic diastolic (congestive) heart failure: Secondary | ICD-10-CM | POA: Diagnosis not present

## 2019-01-19 DIAGNOSIS — M9902 Segmental and somatic dysfunction of thoracic region: Secondary | ICD-10-CM | POA: Diagnosis not present

## 2019-01-19 DIAGNOSIS — J441 Chronic obstructive pulmonary disease with (acute) exacerbation: Secondary | ICD-10-CM

## 2019-01-19 DIAGNOSIS — Z950 Presence of cardiac pacemaker: Secondary | ICD-10-CM

## 2019-01-19 DIAGNOSIS — M542 Cervicalgia: Secondary | ICD-10-CM | POA: Diagnosis not present

## 2019-01-19 DIAGNOSIS — M9901 Segmental and somatic dysfunction of cervical region: Secondary | ICD-10-CM | POA: Diagnosis not present

## 2019-01-19 DIAGNOSIS — M546 Pain in thoracic spine: Secondary | ICD-10-CM | POA: Diagnosis not present

## 2019-01-19 NOTE — Progress Notes (Signed)
Location:      Place of Service:    Provider:    Virgie Dad, MD  Patient Care Team: Virgie Dad, MD as PCP - General (Internal Medicine) Rolm Baptise as Physician Assistant (Internal Medicine)  Extended Emergency Contact Information Primary Emergency Contact: Adin Hector, [place] [address] [name] of [place] Phone: [phone] Relation: Son Secondary Emergency Contact: [name], [place] [address] [name] of [place] Phone: [phone] Mobile Phone: [phone] Relation: Son  Code Status:  DNR Goals of care: Advanced Directive information Advanced Directives 11/13/2018  Does Patient Have a Medical Advance Directive? Yes  Type of Advance Directive Out of facility DNR (pink MOST or yellow form)  Does patient want to make changes to medical advance directive? No - Patient declined  Copy of [place] in Chart? No - copy requested  Would patient like information on creating a medical advance directive? No - Patient declined  Pre-existing out of facility DNR order (yellow form or pink MOST form) -     Chief Complaint  Patient presents with  . Acute Visit    HPI:  Pt is a 85 y.o. female seen Acute visit of Cough and SOB   Patient has h/o Atrial fibrillation on Eliquis, S/P Pacemaker for sick sinus,S/P Acute CVA involving left MCA , ICA stenosis,Diastolic CHF,Dilated Pancreatic Duct, Hemorrhoids,hyperlipidemia,  Hypertension ,Arthritis with Chronic Knee and Back Pain,.Right retinal vein occlusion.And COPD  Patient was seen today as she was c/o SOB and Cough with clear mucus. No Fever, or Chest pain.  The Nurses also Noticed she was Wheezing. Patient has gained some weight recently.From 148 to 152 lbs. But her Appetite is good. Past Medical History:  Diagnosis Date  . Atrial fibrillation ([place])   . Bradycardia   . Breast nodule 11/15/2014  . Breast pain, left 11/15/2014  . Carotid  artery disease ([place])   . Dizziness   . Dyspnea    Previous CPX suggesting possible restrictive physiology, respiratory muscle fatigue, diastolic dysfunction  . Essential hypertension, benign   . Hyperlipidemia   . Oxygen dependent    2 liter  . Pacemaker [place]   . Rib pain on left side 11/15/2014  . Seasonal allergies   . Shingles 05/04/2015  . Stroke ([place])   . Toe fracture, left    left big toe   Past Surgical History:  Procedure Laterality Date  . COLONOSCOPY  02/22/2012   Procedure: COLONOSCOPY;  Surgeon: Rogene Houston, MD;  [facility];  Service: Endoscopy;  Laterality: N/A;  100  . KNEE SURGERY     bilateral  . PACEMAKER IMPLANT N/A 07/17/2017   Procedure: Pacemaker Implant;  Surgeon: Deboraha Sprang, MD;  Location: [facility];  Service: Cardiovascular;  Laterality: N/A;    Allergies  Allergen Reactions  . Sulfur Swelling and Rash    Outpatient Encounter Medications as of 01/19/2019  Medication Sig  . acetaminophen (TYLENOL) 500 MG tablet Take 1,000 mg by mouth 2 (two) times daily. Max of 3 grams acetaminophen per 24 hrs from all sources.  Marland Kitchen amLODipine (NORVASC) 2.5 MG tablet Take 2.5 mg by mouth daily.  Jearl Klinefelter ELLIPTA 62.5-25 MCG/INH AEPB Inhale 1 Inhaler into the lungs daily.   Marland Kitchen apixaban (ELIQUIS) 2.5 MG TABS tablet Take 1 tablet (2.5 mg total) by mouth 2 (two) times daily. Resume 07/20/17  .  artificial tears (LACRILUBE) OINT ophthalmic ointment Place 1 application into both eyes at bedtime.  Marland Kitchen atorvastatin (LIPITOR) 80 MG tablet Take 1 tablet (80 mg total) by mouth daily at 6 PM.  . cycloSPORINE (RESTASIS) 0.05 % ophthalmic emulsion Place 1 drop into both eyes every 12 (twelve) hours.   . diclofenac sodium (VOLTAREN) 1 % GEL Apply 1 application topically 4 (four) times daily as needed. Add to body where needed  . diltiazem (CARDIZEM CD) 180 MG 24 hr capsule TAKE ONE CAPSULE BY MOUTH ONCE DAILY.  . furosemide (LASIX) 40 MG tablet Take 40 mg by  mouth 2 (two) times daily.  Marland Kitchen gabapentin (NEURONTIN) 100 MG capsule Take 100 mg by mouth 2 (two) times daily.  . hydrocortisone (ANUSOL-HC) 2.5 % rectal cream Place 1 application rectally daily.   . Melatonin 5 MG TABS Take 5 mg by mouth at bedtime as needed.  . Multiple Vitamin (MULITIVITAMIN WITH MINERALS) TABS Take 1 tablet by mouth every other day.   . OXYGEN Inhale 2 L into the lungs 2 (two) times daily. 3:15pm to 11:15pm 11:15pm to 07:15am  . pantoprazole (PROTONIX) 40 MG tablet Take 40 mg by mouth daily.  Vladimir Faster Glycol-Propyl Glycol (SYSTANE OP) Place 1 drop into both eyes 3 (three) times daily. For dry eyes  . polyethylene glycol (MIRALAX / GLYCOLAX) packet Take 17 g by mouth daily.  . potassium chloride (K-DUR,KLOR-CON) 10 MEQ tablet Take 20 mEq by mouth daily.  Marland Kitchen PROAIR HFA 108 (90 BASE) MCG/ACT inhaler Inhale 2 puffs into the lungs 3 (three) times daily as needed for wheezing (cough).   . traMADol (ULTRAM) 50 MG tablet Take 1 tablet (50 mg total) by mouth 3 (three) times daily.  . [DISCONTINUED] Albuterol (VENTOLIN IN) Inhale into the lungs.     No facility-administered encounter medications on file as of 01/19/2019.     Review of Systems  Constitutional: Negative.   HENT: Negative.   Respiratory: Negative.   Cardiovascular: Positive for leg swelling.  Gastrointestinal: Negative.   Genitourinary: Negative.   Musculoskeletal: Positive for arthralgias and neck pain.  Neurological: Positive for weakness.  Psychiatric/Behavioral: Positive for dysphoric mood and sleep disturbance.    Immunization History  Administered Date(s) Administered  . Influenza-Unspecified 10/10/2013, 09/11/2017  . Pneumococcal Conjugate-13 09/13/2017  . Tdap 09/16/2017  . Zoster 12/05/2017   Pertinent  Health Maintenance Due  Topic Date Due  . INFLUENZA VACCINE  Completed  . PNA vac Low Risk Adult  Completed  . DEXA SCAN  Discontinued   Fall Risk  09/11/2018 09/02/2017  Falls in the past  year? No Yes  Number falls in past yr: - 1  Injury with Fall? - No   Functional Status Survey:    There were no vitals filed for this visit. There is no height or weight on file to calculate BMI. Physical Exam Constitutional:      Appearance: She is well-developed.  HENT:     Head: Normocephalic.  Eyes:     Pupils: Pupils are equal, round, and reactive to light.  Neck:     Musculoskeletal: Neck supple.  Cardiovascular:     Rate and Rhythm: Normal rate and regular rhythm.     Heart sounds: Murmur present.  Pulmonary:     Effort: Pulmonary effort is normal. No respiratory distress.     Breath sounds: No stridor.     Comments: Bilateral Expiratory Wheezing Abdominal:     General: Bowel sounds are normal. There is no distension.  Palpations: Abdomen is soft.     Tenderness: There is no abdominal tenderness. There is no guarding.  Musculoskeletal:     Comments: Bilateral mild edema  Lymphadenopathy:     Cervical: No cervical adenopathy.  Skin:    General: Skin is warm and dry.  Neurological:     Mental Status: She is alert.  Psychiatric:        Behavior: Behavior normal.        Thought Content: Thought content normal.     Labs reviewed: Recent Labs    10/28/18 0750 11/14/18 0758 11/21/18 0700  NA 141 142 142  K 3.9 4.0 3.9  CL 106 106 104  CO2 25 26 26   GLUCOSE 92 101* 94  BUN 23 22 30*  CREATININE 1.07* 0.98 1.20*  CALCIUM 8.5* 8.9 8.9   Recent Labs    08/08/18 0700 09/18/18 0700 10/28/18 0750  AST 24 21 23   ALT 18 15 20   ALKPHOS 130* 111 115  BILITOT 0.7 0.7 1.1  PROT 7.3 6.4* 7.0  ALBUMIN 3.8 3.4* 3.6   Recent Labs    02/06/18 0630 04/02/18 0920 06/18/18 0715 09/18/18 0700  WBC 6.6 8.7 6.0 5.7  NEUTROABS 3.7 5.8 3.3  --   HGB 13.6 13.4 12.2 12.9  HCT 43.4 42.8 38.1 41.7  MCV 87.3 89.2 88.0 88.2  PLT 219 208 200 185   Lab Results  Component Value Date   TSH 1.446 07/15/2017   Lab Results  Component Value Date   HGBA1C 5.5  12/06/2016   Lab Results  Component Value Date   CHOL 113 12/07/2017   HDL 56 12/07/2017   LDLCALC 48 12/07/2017   TRIG 47 12/07/2017   CHOLHDL 2.0 12/07/2017    Significant Diagnostic Results in last 30 days:  No results found.  Assessment/Plan COPD exacerbation Will start her on Prednisone taper Also Start her on Duo Nebs BID Don't Think she needs Antibiotics Right Now. Also Flu Swab if Spike Temp POX QD Vitals Q4 Hours  Essential hypertension Blood pressure is elevated Probably Anxiety Will continue to monitor She is  on Lasix and Diltiazemand Norvasc Chronic diastolic heart failure On Lasix twice daily Renal function stable I don't think her Wheezing is  CHF related Her Edema actually looks Good  S/P Pacemaker insertion due to Sinus Node dysfunction She is stable  Follows with cardiology No Change on Last Visit  Chronic atrial fibrillation  Rate is controlled on Diltiazem. Continue on lower dose of Eliquis as she has history of GI bleed  Internal carotid artery stenosis, bilateral Patient on Eliquis. Son has decided to not be aggressive.  S/P Cerebrovascular accident with mild Dysphasia Doing well on Eliquis. Aspirin was held due to her GI bleed. No change required. She is on high dose of Statins Dilated pancreatic duct with elevated alk phos Patient follows with GI Ultrasound was negative. At this time no aggressive workup needed Hemorrhoids Patient has not had any more episodes of rectal bleed.or abdominal Pain Her Hgb has been stable . GI had decided not to do any banding procedure due to her age and Eliquis.  on Anusol Insomnia Melatonin as needed. Back and Joint pain Controlled on Ultram.TID And Tyelenol She is also on Neurontin.   And on Voltaren gel      Family/ staff Communication:   Labs/tests ordered:   Total time spent in this patient care encounter was 45_ minutes; greater than 50% of the visit spent counseling  patient, reviewing records ,  Labs and coordinating care for problems addressed at this encounter.

## 2019-01-20 ENCOUNTER — Encounter: Payer: Self-pay | Admitting: Internal Medicine

## 2019-01-20 ENCOUNTER — Non-Acute Institutional Stay (SKILLED_NURSING_FACILITY): Payer: Medicare Other | Admitting: Internal Medicine

## 2019-01-20 ENCOUNTER — Ambulatory Visit (INDEPENDENT_AMBULATORY_CARE_PROVIDER_SITE_OTHER): Payer: Medicare Other

## 2019-01-20 DIAGNOSIS — I10 Essential (primary) hypertension: Secondary | ICD-10-CM | POA: Diagnosis not present

## 2019-01-20 DIAGNOSIS — I442 Atrioventricular block, complete: Secondary | ICD-10-CM

## 2019-01-20 DIAGNOSIS — I4891 Unspecified atrial fibrillation: Secondary | ICD-10-CM

## 2019-01-20 DIAGNOSIS — J441 Chronic obstructive pulmonary disease with (acute) exacerbation: Secondary | ICD-10-CM | POA: Diagnosis not present

## 2019-01-20 LAB — CUP PACEART REMOTE DEVICE CHECK
Battery Remaining Longevity: 128 mo
Brady Statistic RV Percent Paced: 99 %
Date Time Interrogation Session: 20200211070013
Implantable Lead Location: 753860
Implantable Lead Model: 1948
Implantable Pulse Generator Implant Date: 20180808
Lead Channel Pacing Threshold Amplitude: 0.5 V
Lead Channel Pacing Threshold Pulse Width: 0.5 ms
Lead Channel Sensing Intrinsic Amplitude: 4.8 mV
Lead Channel Setting Pacing Amplitude: 2.5 V
Lead Channel Setting Sensing Sensitivity: 0.5 mV
MDC IDC LEAD IMPLANT DT: 20180808
MDC IDC MSMT BATTERY REMAINING PERCENTAGE: 95.5 %
MDC IDC MSMT BATTERY VOLTAGE: 3.01 V
MDC IDC MSMT LEADCHNL RV IMPEDANCE VALUE: 630 Ohm
MDC IDC PG SERIAL: 7957820
MDC IDC SET LEADCHNL RV PACING PULSEWIDTH: 0.5 ms

## 2019-01-20 NOTE — Progress Notes (Signed)
Location:    [facility] Room Number: 156/W Place of Service:  SNF [phone]) Provider:  Freddi Starr, MD  Patient Care Team: Virgie Dad, MD as PCP - General (Internal Medicine) Rolm Baptise as Physician Assistant (Internal Medicine)  Extended Emergency Contact Information Primary Emergency Contact: Adin Hector, [place] [address] [name] of [place] Phone: [phone] Relation: Son Secondary Emergency Contact: [name], [place] [address] [name] of [place] Phone: [phone] Mobile Phone: [phone] Relation: Son  Code Status:  DNR Goals of care: Advanced Directive information Advanced Directives 01/20/2019  Does Patient Have a Medical Advance Directive? Yes  Type of Advance Directive Out of facility DNR (pink MOST or yellow form)  Does patient want to make changes to medical advance directive? No - Patient declined  Copy of [place] in Chart? No - copy requested  Would patient like information on creating a medical advance directive? No - Patient declined  Pre-existing out of facility DNR order (yellow form or pink MOST form) -     Chief complaint-acute visit to follow-up cough and wheezing  HPI:  Pt is a 85 y.o. female seen today for an acute visit for  Follow-up of cough and wheezing with suspected COPD exacerbation    Patient is a long-term resident of facility with a history of atrial fibrillation on Eliquis also history of pacemaker because of sick sinus syndrome- also history of a CVA involving the left MCA- ICA stenosis-in addition to hemorrhoids hyperlipidemia diastolic CHF hypertension chronic knee and back pain and a right retinal vein occlusion.  She also has a history of COPD- she is on Ellipta and pro-air as needed.  She was seen yesterday for shortness of breath and cough with clear mucus-she was afebrile.  She also was wheezing.  She  was seen by Dr. Lyndel Safe and did start her on a prednisone taper as well as routine DuoNeb's twice a day.  Also vitals pulse ox every 4 hours.  Today she says she is breathing better and feeling a bit better.  Apparently she did cough up some brownish sputum earlier.  But she says her breathing is better than yesterday although she does not feel like herself yet  Vital signs appear to be stable as well as oxygen saturations-she says she ate her lunch well  Past Medical History:  Diagnosis Date  . Atrial fibrillation ([place])   . Bradycardia   . Breast nodule 11/15/2014  . Breast pain, left 11/15/2014  . Carotid artery disease ([place])   . Dizziness   . Dyspnea    Previous CPX suggesting possible restrictive physiology, respiratory muscle fatigue, diastolic dysfunction  . Essential hypertension, benign   . Hyperlipidemia   . Oxygen dependent    2 liter  . Pacemaker [place]   . Rib pain on left side 11/15/2014  . Seasonal allergies   . Shingles 05/04/2015  . Stroke ([place])   . Toe fracture, left    left big toe   Past Surgical History:  Procedure Laterality Date  . COLONOSCOPY  02/22/2012   Procedure: COLONOSCOPY;  Surgeon: Rogene Houston, MD;  [facility];  Service: Endoscopy;  Laterality: N/A;  100  . KNEE SURGERY     bilateral  . PACEMAKER IMPLANT N/A 07/17/2017   Procedure: Pacemaker Implant;  Surgeon: Deboraha Sprang,  MD;  Location: [facility];  Service: Cardiovascular;  Laterality: N/A;    Allergies  Allergen Reactions  . Sulfur Swelling and Rash    Outpatient Encounter Medications as of 01/20/2019  Medication Sig  . acetaminophen (TYLENOL) 500 MG tablet Take 1,000 mg by mouth 2 (two) times daily. Max of 3 grams acetaminophen per 24 hrs from all sources.  Marland Kitchen albuterol (PROVENTIL HFA;VENTOLIN HFA) 108 (90 Base) MCG/ACT inhaler Inhale 2 puffs into the lungs at bedtime.  Marland Kitchen amLODipine (NORVASC) 2.5 MG tablet Take 2.5 mg by mouth daily.  Jearl Klinefelter ELLIPTA 62.5-25  MCG/INH AEPB Inhale 1 Inhaler into the lungs daily.   Marland Kitchen apixaban (ELIQUIS) 2.5 MG TABS tablet Take 1 tablet (2.5 mg total) by mouth 2 (two) times daily. Resume 07/20/17  . artificial tears (LACRILUBE) OINT ophthalmic ointment Place 1 application into both eyes at bedtime.  Marland Kitchen atorvastatin (LIPITOR) 80 MG tablet Take 1 tablet (80 mg total) by mouth daily at 6 PM.  . cycloSPORINE (RESTASIS) 0.05 % ophthalmic emulsion Place 1 drop into both eyes every 12 (twelve) hours.   . diclofenac sodium (VOLTAREN) 1 % GEL Apply 1 application topically 4 (four) times daily as needed. Add to body where needed  . diltiazem (CARDIZEM CD) 180 MG 24 hr capsule TAKE ONE CAPSULE BY MOUTH ONCE DAILY.  . furosemide (LASIX) 40 MG tablet Take 40 mg by mouth 2 (two) times daily.  Marland Kitchen gabapentin (NEURONTIN) 100 MG capsule Take 100 mg by mouth 2 (two) times daily.  . hydrocortisone (ANUSOL-HC) 25 MG suppository Place 25 mg rectally daily.  Marland Kitchen ipratropium-albuterol (DUONEB) 0.5-2.5 (3) MG/3ML SOLN Take 3 mLs by nebulization every 6 (six) hours as needed. Starting on 01/25/2019  . ipratropium-albuterol (DUONEB) 0.5-2.5 (3) MG/3ML SOLN Take 3 mLs by nebulization 2 (two) times daily. Starting on 01/19/2019-01/24/2019  . Melatonin 5 MG TABS Take 5 mg by mouth at bedtime as needed.  . Multiple Vitamin (MULITIVITAMIN WITH MINERALS) TABS Take 1 tablet by mouth every other day.   . OXYGEN Inhale 2 L into the lungs 2 (two) times daily. 3:15pm to 11:15pm 11:15pm to 07:15am  . pantoprazole (PROTONIX) 40 MG tablet Take 40 mg by mouth daily.  Vladimir Faster Glycol-Propyl Glycol (SYSTANE OP) Place 1 drop into both eyes 3 (three) times daily. For dry eyes  . polyethylene glycol (MIRALAX / GLYCOLAX) packet Take 17 g by mouth daily.  . potassium chloride (K-DUR,KLOR-CON) 10 MEQ tablet Take 20 mEq by mouth daily.  . predniSONE (DELTASONE) 20 MG tablet Take 20 mg by mouth daily with breakfast. Give 40 mg by mouth once a day from 01/19/2019-01/24/2019. Then  give 30 mg by mouth once a day from 01/25/2019-01/29/2019. Then give 20 mg by mouth once a day from 01/30/2019-2/25-2020. Then take 10 mg by mouth once a day from 02/04/2019-02/08/2019. Then take 5 mg by mouth once a day from 02/09/2019-02/13/2019.  Marland Kitchen PROAIR HFA 108 (90 BASE) MCG/ACT inhaler Inhale 2 puffs into the lungs 2 (two) times daily as needed for wheezing or shortness of breath.   . traMADol (ULTRAM) 50 MG tablet Take 1 tablet (50 mg total) by mouth 3 (three) times daily.  . [DISCONTINUED] Albuterol (VENTOLIN IN) Inhale into the lungs.    . [DISCONTINUED] hydrocortisone (ANUSOL-HC) 2.5 % rectal cream Place 1 application rectally daily.    No facility-administered encounter medications on file as of 01/20/2019.     Review of Systems   In general she is not complain of fever chills.  Skin does not complain of rashes itching or diaphoresis.  Head ears eyes nose mouth and throat does not complain of visual changes or sore throat at this time she has prescription lenses.  Respiratory says her breathing is better than yesterday still has some wheezing on left side but this apparently has improved some as well-- she does have a cough apparently had an episode of more brownish sputum today  Cardiac is not complaining of chest pain edema appears to be at baseline possibly slightly improved from previous exams.  GI does not complain of abdominal pain nausea vomiting diarrhea constipation apparently ate a good lunch per her report   GU does not complain of dysuria.  Musculoskeletal has weakness and at times joint pain but does not really complain of her joint pain today or neck pain.  Neurologic positive for weakness does not complain of dizziness headache or syncope.  And psych continues at times to be  anxious but generally appears to be in good spirits today    Immunization History  Administered Date(s) Administered  . Influenza-Unspecified 10/10/2013, 09/11/2017  . Pneumococcal Conjugate-13  09/13/2017  . Tdap 09/16/2017  . Zoster 12/05/2017   Pertinent  Health Maintenance Due  Topic Date Due  . INFLUENZA VACCINE  Completed  . PNA vac Low Risk Adult  Completed  . DEXA SCAN  Discontinued   Fall Risk  09/11/2018 09/02/2017  Falls in the past year? No Yes  Number falls in past yr: - 1  Injury with Fall? - No   Functional Status Survey:    Vitals:   01/20/19 1343  BP: (!) 141/67  Pulse: 60  Resp: 18  Temp: 97.7 F (36.5 C)  TempSrc: Oral  SpO2: 93%  Weight: 152 lb 6.4 oz (69.1 kg)  Height: 5\' 4"  (1.626 m)   Body mass index is 26.16 kg/m. Physical Exam   General this is a pleasant elderly female in no distress sitting comfortably in her wheelchair.  Skin is warm and dry.  Eyes visual acuity appears intact sclera and conjunctive are clear.  Oropharynx is clear mucous membranes moist.  Chest she does have some wheezing on the left side right side sounds relatively clear there is no labored breathing.  Heart is largely regular rate and rhythm with a baseline systolic murmur she has mild lower extremity edema if anything improved from previous exam.  Abdomen is soft nontender with positive bowel sounds.  Musculoskeletal moves all extremities at baseline with lower extremity weakness she does have arthritic changes most prominently of her knees bilaterally  Neurologic as noted above her speech is clear she continues with some expressive aphasia  Labs reviewed: Recent Labs    10/28/18 0750 11/14/18 0758 11/21/18 0700  NA 141 142 142  K 3.9 4.0 3.9  CL 106 106 104  CO2 25 26 26   GLUCOSE 92 101* 94  BUN 23 22 30*  CREATININE 1.07* 0.98 1.20*  CALCIUM 8.5* 8.9 8.9   Recent Labs    08/08/18 0700 09/18/18 0700 10/28/18 0750  AST 24 21 23   ALT 18 15 20   ALKPHOS 130* 111 115  BILITOT 0.7 0.7 1.1  PROT 7.3 6.4* 7.0  ALBUMIN 3.8 3.4* 3.6   Recent Labs    02/06/18 0630 04/02/18 0920 06/18/18 0715 09/18/18 0700  WBC 6.6 8.7 6.0 5.7  NEUTROABS  3.7 5.8 3.3  --   HGB 13.6 13.4 12.2 12.9  HCT 43.4 42.8 38.1 41.7  MCV 87.3 89.2 88.0 88.2  PLT 219 208 200 185   Lab Results  Component Value Date   TSH 1.446 07/15/2017   Lab Results  Component Value Date   HGBA1C 5.5 12/06/2016   Lab Results  Component Value Date   CHOL 113 12/07/2017   HDL 56 12/07/2017   LDLCALC 48 12/07/2017   TRIG 47 12/07/2017   CHOLHDL 2.0 12/07/2017    Significant Diagnostic Results in last 30 days:  No results found.  Assessment/Plan T #1 history of COPD exacerbation- she has been started on a prednisone taper which will be slowly titrated down- she also has routine duo nebs twice a day through February 15 and then as needed.  She also continues on [employer]- as well as pro-air  She has been afebrile says she is doing better today but still not feeling like herself-- wheezing appears to have been improving  At this point continue to monitor if she spikes a fever obtain a flu swab and consider antibiotics but at this point will wait.  Also will add a Robitussin routinely every 6 hours for 48 hours and then as needed  This was discussed with Dr. Lyndel Safe  2.  Hypertension blood pressure is only mildly elevated today appears to be improving compared to yesterday continues diltiazem Norvasc as well as her diuretic Lasix.  3 history atrial fibrillation she is on diltiazem this appears to be rate controlled rate was largely regular today she is on low-dose Eliquis because she is free of GI bleed  [id number]

## 2019-01-21 ENCOUNTER — Non-Acute Institutional Stay (SKILLED_NURSING_FACILITY): Payer: Medicare Other | Admitting: Internal Medicine

## 2019-01-21 ENCOUNTER — Encounter: Payer: Self-pay | Admitting: Internal Medicine

## 2019-01-21 ENCOUNTER — Encounter (HOSPITAL_COMMUNITY)
Admission: RE | Admit: 2019-01-21 | Discharge: 2019-01-21 | Disposition: A | Discharge status: Home / Self Care | Payer: Medicare Other | Source: Skilled Nursing Facility | Attending: Internal Medicine | Admitting: Internal Medicine

## 2019-01-21 DIAGNOSIS — J441 Chronic obstructive pulmonary disease with (acute) exacerbation: Secondary | ICD-10-CM | POA: Diagnosis not present

## 2019-01-21 DIAGNOSIS — I509 Heart failure, unspecified: Secondary | ICD-10-CM | POA: Diagnosis not present

## 2019-01-21 DIAGNOSIS — I110 Hypertensive heart disease with heart failure: Secondary | ICD-10-CM | POA: Diagnosis not present

## 2019-01-21 DIAGNOSIS — I5032 Chronic diastolic (congestive) heart failure: Secondary | ICD-10-CM

## 2019-01-21 DIAGNOSIS — R0989 Other specified symptoms and signs involving the circulatory and respiratory systems: Secondary | ICD-10-CM | POA: Diagnosis not present

## 2019-01-21 DIAGNOSIS — R062 Wheezing: Secondary | ICD-10-CM | POA: Diagnosis not present

## 2019-01-21 LAB — BASIC METABOLIC PANEL
ANION GAP: 11 (ref 5–15)
BUN: 30 mg/dL — AB (ref 8–23)
CO2: 22 mmol/L (ref 22–32)
Calcium: 8.6 mg/dL — ABNORMAL LOW (ref 8.9–10.3)
Chloride: 106 mmol/L (ref 98–111)
Creatinine, Ser: 1.08 mg/dL — ABNORMAL HIGH (ref 0.44–1.00)
GFR, EST AFRICAN AMERICAN: 49 mL/min — AB (ref >60)
GFR, EST NON AFRICAN AMERICAN: 43 mL/min — AB (ref >60)
Glucose, Bld: 115 mg/dL — ABNORMAL HIGH (ref 70–99)
POTASSIUM: 3.9 mmol/L (ref 3.5–5.1)
SODIUM: 139 mmol/L (ref 135–145)

## 2019-01-21 LAB — CBC WITH DIFFERENTIAL/PLATELET
ABS IMMATURE GRANULOCYTES: 0.05 10*3/uL (ref 0.00–0.07)
BASOS PCT: 0 %
Basophils Absolute: 0 10*3/uL (ref 0.0–0.1)
EOS ABS: 0 10*3/uL (ref 0.0–0.5)
Eosinophils Relative: 0 %
HCT: 41 % (ref 36.0–46.0)
Hemoglobin: 12.8 g/dL (ref 12.0–15.0)
IMMATURE GRANULOCYTES: 0 %
Lymphocytes Relative: 13 %
Lymphs Abs: 1.5 10*3/uL (ref 0.7–4.0)
MCH: 27.6 pg (ref 26.0–34.0)
MCHC: 31.2 g/dL (ref 30.0–36.0)
MCV: 88.6 fL (ref 80.0–100.0)
MONOS PCT: 13 %
Monocytes Absolute: 1.5 10*3/uL — ABNORMAL HIGH (ref 0.1–1.0)
NEUTROS PCT: 74 %
Neutro Abs: 8.6 10*3/uL — ABNORMAL HIGH (ref 1.7–7.7)
Platelets: 186 10*3/uL (ref 150–400)
RBC: 4.63 MIL/uL (ref 3.87–5.11)
RDW: 15.1 % (ref 11.5–15.5)
WBC: 11.6 10*3/uL — AB (ref 4.0–10.5)
nRBC: 0 % (ref 0.0–0.2)

## 2019-01-21 LAB — BRAIN NATRIURETIC PEPTIDE: B NATRIURETIC PEPTIDE 5: 336 pg/mL — AB (ref 0.0–100.0)

## 2019-01-21 NOTE — Progress Notes (Signed)
Location:    [facility] Room Number: 156/W Place of Service:  SNF [phone]) Provider:  Freddi Starr, MD  Patient Care Team: Virgie Dad, MD as PCP - General (Internal Medicine) Rolm Baptise as Physician Assistant (Internal Medicine)  Extended Emergency Contact Information Primary Emergency Contact: Adin Hector, [place] [address] [name] of [place] Phone: [phone] Relation: Son Secondary Emergency Contact: [name], [place] [address] [name] of [place] Phone: [phone] Mobile Phone: [phone] Relation: Son  Code Status:  DNR Goals of care: Advanced Directive information Advanced Directives 01/21/2019  Does Patient Have a Medical Advance Directive? Yes  Type of Advance Directive Out of facility DNR (pink MOST or yellow form)  Does patient want to make changes to medical advance directive? No - Patient declined  Copy of [place] in Chart? No - copy requested  Would patient like information on creating a medical advance directive? No - Patient declined  Pre-existing out of facility DNR order (yellow form or pink MOST form) -     Chief complaint-acute visit follow-up of wheezing with suspected COPD exacerbation  HPI:  Pt is a 85 y.o. female seen today for an acute visit for  Of wheezing with suspicions for COPD exacerbation.  Patient is a long-term resident of facility with a history of atrial fibrillation she is on Eliquis as well as a history of pacemaker because of sick sinus syndrome.  She also has a history of hemorrhoids as well as hyperlipidemia diastolic CHF hypertension chronic knee and back pain and a history of a CVA.  She was seen a couple days ago by Dr. Lyndel Safe for shortness of breath and cough with clear mucus.  She was wheezing as well.  She has been started on a prednisone taper as well as routine duo nebs twice a day she also has PRN  every 6 hours.  I saw her yesterday and she said she was feeling a bit better- there was some wheezing continuing on the left lung fields-oxygen saturation was stable as well as temperature was afebrile.  We did add Robitussin as an expectorant.  She also continues on Ellipta and has pro-air as needed  Today she continues to have some wheezing and congestion patient herself says she feels about the same as yesterday- but her son feels that possibly she is having some increased shortness of breath.  Oxygen saturation is in the low to mid 90s on room air.  Vital signs are stable systolic is mildly elevated but she is somewhat anxious which is not new.    Past Medical History:  Diagnosis Date  . Atrial fibrillation ([place])   . Bradycardia   . Breast nodule 11/15/2014  . Breast pain, left 11/15/2014  . Carotid artery disease ([place])   . Dizziness   . Dyspnea    Previous CPX suggesting possible restrictive physiology, respiratory muscle fatigue, diastolic dysfunction  . Essential hypertension, benign   . Hyperlipidemia   . Oxygen dependent    2 liter  . Pacemaker [place]   . Rib pain on left side 11/15/2014  . Seasonal allergies   . Shingles 05/04/2015  . Stroke ([place])   . Toe fracture, left    left big toe   Past Surgical History:  Procedure Laterality Date  . COLONOSCOPY  02/22/2012   Procedure: COLONOSCOPY;  Surgeon: Rogene Houston, MD;  [facility];  Service: Endoscopy;  Laterality: N/A;  100  . KNEE SURGERY     bilateral  . PACEMAKER IMPLANT N/A 07/17/2017   Procedure: Pacemaker Implant;  Surgeon: Deboraha Sprang, MD;  Location: [facility];  Service: Cardiovascular;  Laterality: N/A;    Allergies  Allergen Reactions  . Sulfur Swelling and Rash    Outpatient Encounter Medications as of 01/21/2019  Medication Sig  . acetaminophen (TYLENOL) 500 MG tablet Take 1,000 mg by mouth 2 (two) times daily. Max of 3 grams acetaminophen per 24 hrs from all sources.    Marland Kitchen albuterol (PROVENTIL HFA;VENTOLIN HFA) 108 (90 Base) MCG/ACT inhaler Inhale 2 puffs into the lungs at bedtime.  Marland Kitchen amLODipine (NORVASC) 2.5 MG tablet Take 2.5 mg by mouth daily.  Jearl Klinefelter ELLIPTA 62.5-25 MCG/INH AEPB Inhale 1 Inhaler into the lungs daily.   Marland Kitchen apixaban (ELIQUIS) 2.5 MG TABS tablet Take 1 tablet (2.5 mg total) by mouth 2 (two) times daily. Resume 07/20/17  . artificial tears (LACRILUBE) OINT ophthalmic ointment Place 1 application into both eyes at bedtime.  Marland Kitchen atorvastatin (LIPITOR) 80 MG tablet Take 1 tablet (80 mg total) by mouth daily at 6 PM.  . cycloSPORINE (RESTASIS) 0.05 % ophthalmic emulsion Place 1 drop into both eyes every 12 (twelve) hours.   . diclofenac sodium (VOLTAREN) 1 % GEL Apply 1 application topically 4 (four) times daily as needed. Add to body where needed  . diltiazem (CARDIZEM CD) 180 MG 24 hr capsule TAKE ONE CAPSULE BY MOUTH ONCE DAILY.  Marland Kitchen Doxylamine-DM (ROBITUSSIN NIGHTTIME COUGH DM) 12.5-30 MG/10ML LIQD Take 12.5-30 mg by mouth every 4 (four) hours as needed. Give routine for 48 hours and then prn  . furosemide (LASIX) 40 MG tablet Take 40 mg by mouth 2 (two) times daily.  Marland Kitchen gabapentin (NEURONTIN) 100 MG capsule Take 100 mg by mouth 2 (two) times daily.  . hydrocortisone (ANUSOL-HC) 25 MG suppository Place 25 mg rectally daily.  Marland Kitchen ipratropium-albuterol (DUONEB) 0.5-2.5 (3) MG/3ML SOLN Take 3 mLs by nebulization every 6 (six) hours as needed. Starting on 01/25/2019  . ipratropium-albuterol (DUONEB) 0.5-2.5 (3) MG/3ML SOLN Take 3 mLs by nebulization 2 (two) times daily. Starting on 01/19/2019-01/24/2019  . Melatonin 5 MG TABS Take 5 mg by mouth at bedtime as needed.  . Multiple Vitamin (MULITIVITAMIN WITH MINERALS) TABS Take 1 tablet by mouth every other day.   . OXYGEN Inhale 2 L into the lungs 2 (two) times daily. 3:15pm to 11:15pm 11:15pm to 07:15am  . pantoprazole (PROTONIX) 40 MG tablet Take 40 mg by mouth daily.  Vladimir Faster Glycol-Propyl Glycol  (SYSTANE OP) Place 1 drop into both eyes 3 (three) times daily. For dry eyes  . polyethylene glycol (MIRALAX / GLYCOLAX) packet Take 17 g by mouth daily.  . potassium chloride (K-DUR,KLOR-CON) 10 MEQ tablet Take 20 mEq by mouth daily.  . predniSONE (DELTASONE) 20 MG tablet Take 20 mg by mouth daily with breakfast. Give 40 mg by mouth once a day from 01/19/2019-01/24/2019. Then give 30 mg by mouth once a day from 01/25/2019-01/29/2019. Then give 20 mg by mouth once a day from 01/30/2019-2/25-2020. Then take 10 mg by mouth once a day from 02/04/2019-02/08/2019. Then take 5 mg by mouth once a day from 02/09/2019-02/13/2019.  Marland Kitchen PROAIR HFA 108 (90 BASE) MCG/ACT inhaler Inhale 2 puffs into the lungs 2 (two) times daily as needed for wheezing or shortness of breath.   Marland Kitchen  traMADol (ULTRAM) 50 MG tablet Take 1 tablet (50 mg total) by mouth 3 (three) times daily.  . [DISCONTINUED] Albuterol (VENTOLIN IN) Inhale into the lungs.     No facility-administered encounter medications on file as of 01/21/2019.     Review of Systems   In general she is not complaining of any fever or chills.  Skin is not complain of diaphoresis rashes or itching.  Head ears eyes nose mouth and throat does not complain of visual changes or nasal discharge or sore throat.  Respiratory says at times she gets short of breath says this is relatively baseline with yesterday-her son feels however she is having some increased shortness of breath over the last few days.  She continues to have a cough at times productive.  Cardiac does not complain of chest pain edema appears to be stable.  GI is not complaining of abdominal pain nausea vomiting diarrhea constipation appetite appears to be fairly good she is eating some takeout that her son brought her for breakfast.  GU does not complain of dysuria.  Musculoskeletal continues with weakness and diffuse arthritic discomfort at times but does not really complain of that this morning.  Neurologic  she does not complain of dizziness headache or syncope.  And psych peers to be a bit anxious which is not new does not appear to be overtly depressed  Immunization History  Administered Date(s) Administered  . Influenza-Unspecified 10/10/2013, 09/11/2017  . Pneumococcal Conjugate-13 09/13/2017  . Tdap 09/16/2017  . Zoster 12/05/2017   Pertinent  Health Maintenance Due  Topic Date Due  . INFLUENZA VACCINE  Completed  . PNA vac Low Risk Adult  Completed  . DEXA SCAN  Discontinued   Fall Risk  09/11/2018 09/02/2017  Falls in the past year? No Yes  Number falls in past yr: - 1  Injury with Fall? - No   Functional Status Survey:    Vitals:   01/21/19 1043  BP: (!) 156/67  Pulse: (!) 59  Resp: 18  Temp: 98.4 F (36.9 C)  TempSrc: Oral  Weight: 152 lb 6.4 oz (69.1 kg)  Height: 5\' 4"  (1.626 m)   Body mass index is 26.16 kg/m. Physical Exam   In general this is a pleasant elderly female in no distress she is actually eating breakfast.  Skin is warm and dry.  Eyes visual acuity appears to be intact she has prescription lenses.  Oropharynx is clear mucous membranes moist.  Chest she continued to have some wheezing most prominently on the left side of her chest and a minimal amount on the upper right side- there is no labored breathing.  Heart is regular rate and rhythm with baseline systolic murmur she continues to have mild lower extremity edema.  Abdomen soft nontender with positive bowel sounds.  Musculoskeletal moves all her extremities at baseline continues to have lower extremity weakness at baseline and has diffuse arthritic changes.  Neurologic is grossly intact speech is clear and she does continue with expressive aphasia.  Psych she is largely alert and oriented pleasant and appropriate  Labs reviewed: Recent Labs    10/28/18 0750 11/14/18 0758 11/21/18 0700  NA 141 142 142  K 3.9 4.0 3.9  CL 106 106 104  CO2 25 26 26   GLUCOSE 92 101* 94  BUN 23 22  30*  CREATININE 1.07* 0.98 1.20*  CALCIUM 8.5* 8.9 8.9   Recent Labs    08/08/18 0700 09/18/18 0700 10/28/18 0750  AST 24 21 23  ALT 18 15 20   ALKPHOS 130* 111 115  BILITOT 0.7 0.7 1.1  PROT 7.3 6.4* 7.0  ALBUMIN 3.8 3.4* 3.6   Recent Labs    02/06/18 0630 04/02/18 0920 06/18/18 0715 09/18/18 0700  WBC 6.6 8.7 6.0 5.7  NEUTROABS 3.7 5.8 3.3  --   HGB 13.6 13.4 12.2 12.9  HCT 43.4 42.8 38.1 41.7  MCV 87.3 89.2 88.0 88.2  PLT 219 208 200 185   Lab Results  Component Value Date   TSH 1.446 07/15/2017   Lab Results  Component Value Date   HGBA1C 5.5 12/06/2016   Lab Results  Component Value Date   CHOL 113 12/07/2017   HDL 56 12/07/2017   LDLCALC 48 12/07/2017   TRIG 47 12/07/2017   CHOLHDL 2.0 12/07/2017    Significant Diagnostic Results in last 30 days:  No results found.  Assessment/Plan  #1 history of COPD with exacerbation- she continues on a prednisone taper as well as routine duo nebs twice a day we have also added Robitussin routinely for a few days.  She also is on [employer] as well as pro-air as needed.  Patient continues to have some wheezing and shortness of breath-mainly with exertion it appears.  Will obtain a chest x-ray- also will make DuoNebs routine 4 times a day for 2 days and then back to as needed.  Will start Levaquin 500 mg a day for 1 dose then 250 mg for 6 additional days-we will add a probiotic for 10 days.  Continue to monitor vital signs and pulse ox every 4 hours for 2 days and then every shift also will update lab work including a CBC metabolic panel and BNP.  2 history of diastolic CHF-her edema actually if anything appears to be reduced- she is on Lasix 40 mg twice daily- back in December we did increase it for a few days up to 60 in the morning and 40 at night secondary to some weight gain at that point she was around around 154 pounds.  It appears weight since then have varied from the high 140s to low 150s  #3  hypertension.  Still at times has somewhat elevated systolics but she is anxious today will continue to monitor she continues on diltiazem Norvasc the diuretic Lasix.  Addendum-we have received reports of the updated labs-  Her BMP shows stability with a creatinine of 1.08 BUN of 30- sodium is 139 potassium 3.9.  CBC with differential shows a mildly elevated white count of 11.6 of note she is on prednisone.  Hemoglobin is stable at 12.8 platelets of 186  Her BNP is elevated at 336-year ago it was running it appears in the high 200s to high 300s. It appears there is some chronicity to this-  I did reevaluate patient late afternoon she says she was feeling somewhat better she was sitting comfortably in her wheelchair continues to have some wheezing but is not really complaining of shortness of breath currently -- this is more with exertion.  [place] x-ray and continue closely monitoring vital signs--  [phone]

## 2019-01-23 ENCOUNTER — Non-Acute Institutional Stay (SKILLED_NURSING_FACILITY): Payer: Medicare Other | Admitting: Internal Medicine

## 2019-01-23 ENCOUNTER — Encounter: Payer: Self-pay | Admitting: Internal Medicine

## 2019-01-23 DIAGNOSIS — J441 Chronic obstructive pulmonary disease with (acute) exacerbation: Secondary | ICD-10-CM

## 2019-01-23 DIAGNOSIS — I10 Essential (primary) hypertension: Secondary | ICD-10-CM

## 2019-01-23 DIAGNOSIS — I5032 Chronic diastolic (congestive) heart failure: Secondary | ICD-10-CM

## 2019-01-23 NOTE — Progress Notes (Signed)
Location:    [facility] Room Number: 156/W Place of Service:  SNF [phone]) Provider:  Freddi Starr, MD  Patient Care Team: Virgie Dad, MD as PCP - General (Internal Medicine) Rolm Baptise as Physician Assistant (Internal Medicine)  Extended Emergency Contact Information Primary Emergency Contact: Adin Hector, [place] [address] [name] of [place] Phone: [phone] Relation: Son Secondary Emergency Contact: [name], [place] [address] [name] of [place] Phone: [phone] Mobile Phone: [phone] Relation: Son  Code Status:  DNR Goals of care: Advanced Directive information Advanced Directives 01/23/2019  Does Patient Have a Medical Advance Directive? Yes  Type of Advance Directive Out of facility DNR (pink MOST or yellow form)  Does patient want to make changes to medical advance directive? No - Patient declined  Copy of [place] in Chart? No - copy requested  Would patient like information on creating a medical advance directive? No - Patient declined  Pre-existing out of facility DNR order (yellow form or pink MOST form) -     Chief Complaint  Patient presents with  . Acute Visit    Respiratory Issues    HPI:  Pt is a 85 y.o. female seen today for an acute visit for for follow-up of respiratory issues with suspected COPD exacerbation.  She is a long-term resident of facility with a history of atrial fibrillation as well as history of pacemaker because of sick sinus syndrome she also has a history of diastolic CHF in addition to hypertension hyperlipidemia hemorrhoids and chronic knee and back pain as well as a history of a CVA.  She was seen earlier this week for shortness of breath and cough with clear mucus.  And was started on a prednisone taper as well as routine DuoNeb's twice a day and PRN every 6 hours.  I saw her the next day and said she  was feeling a bit better there was some wheezing continuing especially in the left lung fields vital signs were stable oxygen saturation was unremarkable.  We added Robitussin.  She also continues on Ellipta as well as pro-air as needed.  Subsequently when I saw her the next day she had some wheezing and congestion her son thought possibly she was having some increased shortness of breath oxygen saturation was in the low to mid 90s.  We did obtain a chest x-ray which actually did not show any acute process we also made duo nebs routine 4 times a day for 2 days and then back to as needed.  She was also started on Levaquin for 7-day course.  Apparently she did well yesterday.    Today she appears to be relatively baseline feels however she wants to make sure she does receive her nebulizers routinely-she says she still has some shortness of breath although this is not really increased over the last couple days  I also discussed her status with Dr. Marletta Lor we will give her a little more Lasix 20 mg over the weekend which may help as well.  She does have a history of diastolic CHF she is already on 40 mg of Lasix  bid we will increase it slightly to 60 mg once a day and 40 mg once a day through the weekend  Past Medical History:  Diagnosis Date  . Atrial fibrillation ([place])   . Bradycardia   .  Breast nodule 11/15/2014  . Breast pain, left 11/15/2014  . Carotid artery disease ([place])   . Dizziness   . Dyspnea    Previous CPX suggesting possible restrictive physiology, respiratory muscle fatigue, diastolic dysfunction  . Essential hypertension, benign   . Hyperlipidemia   . Oxygen dependent    2 liter  . Pacemaker [place]   . Rib pain on left side 11/15/2014  . Seasonal allergies   . Shingles 05/04/2015  . Stroke ([place])   . Toe fracture, left    left big toe   Past Surgical History:  Procedure Laterality Date  . COLONOSCOPY  02/22/2012   Procedure: COLONOSCOPY;  Surgeon: Rogene Houston, MD;  [facility];  Service: Endoscopy;  Laterality: N/A;  100  . KNEE SURGERY     bilateral  . PACEMAKER IMPLANT N/A 07/17/2017   Procedure: Pacemaker Implant;  Surgeon: Deboraha Sprang, MD;  Location: [facility];  Service: Cardiovascular;  Laterality: N/A;    Allergies  Allergen Reactions  . Sulfur Swelling and Rash    Outpatient Encounter Medications as of 01/23/2019  Medication Sig  . acetaminophen (TYLENOL) 500 MG tablet Take 1,000 mg by mouth 2 (two) times daily. Max of 3 grams acetaminophen per 24 hrs from all sources.  Marland Kitchen albuterol (PROVENTIL HFA;VENTOLIN HFA) 108 (90 Base) MCG/ACT inhaler Inhale 2 puffs into the lungs at bedtime. For SOB or wheezing  . amLODipine (NORVASC) 2.5 MG tablet Take 2.5 mg by mouth daily.  Jearl Klinefelter ELLIPTA 62.5-25 MCG/INH AEPB Inhale 1 Inhaler into the lungs daily.   Marland Kitchen apixaban (ELIQUIS) 2.5 MG TABS tablet Take 1 tablet (2.5 mg total) by mouth 2 (two) times daily. Resume 07/20/17  . artificial tears (LACRILUBE) OINT ophthalmic ointment Place 1 application into both eyes at bedtime.  Marland Kitchen atorvastatin (LIPITOR) 80 MG tablet Take 1 tablet (80 mg total) by mouth daily at 6 PM.  . cycloSPORINE (RESTASIS) 0.05 % ophthalmic emulsion Place 1 drop into both eyes every 12 (twelve) hours.   . diclofenac sodium (VOLTAREN) 1 % GEL Apply 1 application topically 4 (four) times daily as needed. Add to body where needed  . diltiazem (CARDIZEM CD) 180 MG 24 hr capsule TAKE ONE CAPSULE BY MOUTH ONCE DAILY.  Marland Kitchen Doxylamine-DM (ROBITUSSIN NIGHTTIME COUGH DM) 12.5-30 MG/10ML LIQD Take 12.5-30 mg by mouth daily as needed.   . furosemide (LASIX) 40 MG tablet Take 40 mg by mouth 2 (two) times daily.  Marland Kitchen gabapentin (NEURONTIN) 100 MG capsule Take 100 mg by mouth 2 (two) times daily.  . hydrocortisone (ANUSOL-HC) 25 MG suppository Place 25 mg rectally daily.  Marland Kitchen ipratropium-albuterol (DUONEB) 0.5-2.5 (3) MG/3ML SOLN Take 3 mLs by nebulization every 6 (six) hours as  needed. Starting on 01/25/2019  For wheezing or SOB  . ipratropium-albuterol (DUONEB) 0.5-2.5 (3) MG/3ML SOLN Take 3 mLs by nebulization 4 (four) times daily. Starting on 01/19/2019-01/24/2019 for wheezing  . levofloxacin (LEVAQUIN) 250 MG tablet Take 250 mg by mouth daily. For 6 days due to URI  . Melatonin 5 MG TABS Take 5 mg by mouth at bedtime as needed.  . Multiple Vitamin (MULITIVITAMIN WITH MINERALS) TABS Take 1 tablet by mouth every other day.   . OXYGEN Inhale 2 L into the lungs 2 (two) times daily. 3:15pm to 11:15pm 11:15pm to 07:15am  . pantoprazole (PROTONIX) 40 MG tablet Take 40 mg by mouth daily.  Vladimir Faster Glycol-Propyl Glycol (SYSTANE OP) Place 1 drop into both eyes 3 (  three) times daily. For dry eyes  . polyethylene glycol (MIRALAX / GLYCOLAX) packet Take 17 g by mouth daily. For constipation  . potassium chloride (K-DUR,KLOR-CON) 10 MEQ tablet Take 20 mEq by mouth daily.  . predniSONE (DELTASONE) 20 MG tablet Take 20 mg by mouth daily with breakfast. Give 40 mg by mouth once a day from 01/19/2019-01/24/2019. Then give 30 mg by mouth once a day from 01/25/2019-01/29/2019. Then give 20 mg by mouth once a day from 01/30/2019-2/25-2020. Then take 10 mg by mouth once a day from 02/04/2019-02/08/2019. Then take 5 mg by mouth once a day from 02/09/2019-02/13/2019.  Marland Kitchen PROAIR HFA 108 (90 BASE) MCG/ACT inhaler Inhale 2 puffs into the lungs 2 (two) times daily as needed for wheezing or shortness of breath.   . Probiotic Product (RISA-BID PROBIOTIC) TABS One tablet by mouth twice a day for 10 days from 01/21/2019-01/31/2019  . traMADol (ULTRAM) 50 MG tablet Take 1 tablet (50 mg total) by mouth 3 (three) times daily.  . [DISCONTINUED] Albuterol (VENTOLIN IN) Inhale into the lungs.     No facility-administered encounter medications on file as of 01/23/2019.     Review of Systems   In general she is not complaining of fever chills.  Skin is not complain of rashes or itching.  Head ears eyes nose mouth  and throat does not complain of visual changes or sore throat has prescription lenses.  Respiratory says at times she does have some shortness of breath that apparently comes and goes continues to have a cough at times.  Cardiac does not complain of chest pain or palpitations edema appears to be baseline.  GI does not complain of abdominal discomfort nausea vomiting diarrhea or constipation.  GU does not complain of dysuria.  Musculoskeletal does have somewhat diffuse joint complaints at times is complaining more of left shoulder discomfort today with a history of arthritis.  Neurologic does not complain of dizziness headache or numbness does have weakness.  And psych does not complain of being overtly depressed or anxious.    Immunization History  Administered Date(s) Administered  . Influenza-Unspecified 10/10/2013, 09/11/2017  . Pneumococcal Conjugate-13 09/13/2017  . Tdap 09/16/2017  . Zoster 12/05/2017   Pertinent  Health Maintenance Due  Topic Date Due  . INFLUENZA VACCINE  Completed  . PNA vac Low Risk Adult  Completed  . DEXA SCAN  Discontinued   Fall Risk  09/11/2018 09/02/2017  Falls in the past year? No Yes  Number falls in past yr: - 1  Injury with Fall? - No   Functional Status Survey:    Vitals:   01/23/19 1555  BP: (!) 158/72  Pulse: 60  Resp: (!) 21  Temp: (!) 97.2 F (36.2 C)  TempSrc: Oral  SpO2: 98%  Weight: 152 lb 6.4 oz (69.1 kg)  Height: 5\' 4"  (1.626 m)   Body mass index is 26.16 kg/m. Physical Exam   In general this is a pleasant elderly female no distress sitting in her wheelchair.  Her skin is warm and dry.  Eyes visual acuity appears grossly intact she has prescription lenses.  Oropharynx is clear mucous membranes moist.  Chest does have some scattered wheezing both lungs there is no increased work of breathing however.  Heart is somewhat distant heart sounds largely regular rate and rhythm cannot really appreciate a murmur  gallop or rub she has baseline lower extremity edema this does not appear increased from previous exams.  Her abdomen is soft nontender with positive bowel  sounds.  Musculoskeletal moves all her extremities at baseline she is complaining of some left shoulder discomfort I did not note any deformity she is able to move it and actually later during exam she used the arm to untie the cuff on her right arm.  Neurologic is grossly intact her speech is clear no lateralizing findings.  Psych she continues to be alert oriented pleasant and appropriate   Labs reviewed: Recent Labs    11/14/18 0758 11/21/18 0700 01/21/19 1030  NA 142 142 139  K 4.0 3.9 3.9  CL 106 104 106  CO2 26 26 22   GLUCOSE 101* 94 115*  BUN 22 30* 30*  CREATININE 0.98 1.20* 1.08*  CALCIUM 8.9 8.9 8.6*   Recent Labs    08/08/18 0700 09/18/18 0700 10/28/18 0750  AST 24 21 23   ALT 18 15 20   ALKPHOS 130* 111 115  BILITOT 0.7 0.7 1.1  PROT 7.3 6.4* 7.0  ALBUMIN 3.8 3.4* 3.6   Recent Labs    04/02/18 0920 06/18/18 0715 09/18/18 0700 01/21/19 1030  WBC 8.7 6.0 5.7 11.6*  NEUTROABS 5.8 3.3  --  8.6*  HGB 13.4 12.2 12.9 12.8  HCT 42.8 38.1 41.7 41.0  MCV 89.2 88.0 88.2 88.6  PLT 208 200 185 186   Lab Results  Component Value Date   TSH 1.446 07/15/2017   Lab Results  Component Value Date   HGBA1C 5.5 12/06/2016   Lab Results  Component Value Date   CHOL 113 12/07/2017   HDL 56 12/07/2017   LDLCALC 48 12/07/2017   TRIG 47 12/07/2017   CHOLHDL 2.0 12/07/2017    Significant Diagnostic Results in last 30 days:  No results found.  Assessment/Plan  #1 history of COPD with suspected aspiration chest x-ray did not really show any acute process I suspect she does have an element of bronchitis here she is on a prednisone taper as well as DuoNeb's which we have made routine 4 times a day she also has Robitussin as needed-we will try to make this routine for a few more days.  She does continue on  Levaquin through February 18.  Continues on her routine an Anora Ellipta as well as pro-air at bedtime.   2-- Also with her history of diastolic CHF-x-ray did not really show concerning pulmonary vascular but adding a little bit more Lasix may add to her comfort will again increase her Lasix to 60 mg once a day and 40 mg once a day for total 100 mg a day through the weekend   We will update labs on Monday morning February 17.  3.  Hypertension she still has some elevated systolics but she continues to be somewhat anxious which could be contributing to this suspect increasing Lasix may help somewhat with this as well.  She continues on diltiazem and Norvasc as well continue to monitor.  4.-Left shoulder discomfort this appears to be somewhat transitory but I have spoke with nursing about keeping an eye on this if this is persistent consider obtaining an x-ray she does have a significant history of arthritis  [phone]

## 2019-01-26 ENCOUNTER — Other Ambulatory Visit (HOSPITAL_COMMUNITY)
Admission: RE | Admit: 2019-01-26 | Discharge: 2019-01-26 | Disposition: A | Discharge status: Home / Self Care | Payer: Medicare Other | Source: Skilled Nursing Facility | Attending: Internal Medicine | Admitting: Internal Medicine

## 2019-01-26 DIAGNOSIS — M25562 Pain in left knee: Secondary | ICD-10-CM | POA: Insufficient documentation

## 2019-01-26 DIAGNOSIS — G9009 Other idiopathic peripheral autonomic neuropathy: Secondary | ICD-10-CM | POA: Insufficient documentation

## 2019-01-26 DIAGNOSIS — J449 Chronic obstructive pulmonary disease, unspecified: Secondary | ICD-10-CM | POA: Insufficient documentation

## 2019-01-26 DIAGNOSIS — M25561 Pain in right knee: Secondary | ICD-10-CM | POA: Insufficient documentation

## 2019-01-26 DIAGNOSIS — I63312 Cerebral infarction due to thrombosis of left middle cerebral artery: Secondary | ICD-10-CM | POA: Insufficient documentation

## 2019-01-26 DIAGNOSIS — F5101 Primary insomnia: Secondary | ICD-10-CM | POA: Insufficient documentation

## 2019-01-26 LAB — CBC WITH DIFFERENTIAL/PLATELET
Abs Immature Granulocytes: 0.29 10*3/uL — ABNORMAL HIGH (ref 0.00–0.07)
Basophils Absolute: 0 10*3/uL (ref 0.0–0.1)
Basophils Relative: 0 %
EOS ABS: 0 10*3/uL (ref 0.0–0.5)
Eosinophils Relative: 0 %
HEMATOCRIT: 43.2 % (ref 36.0–46.0)
Hemoglobin: 13.3 g/dL (ref 12.0–15.0)
IMMATURE GRANULOCYTES: 3 %
LYMPHS ABS: 1.7 10*3/uL (ref 0.7–4.0)
Lymphocytes Relative: 15 %
MCH: 27.4 pg (ref 26.0–34.0)
MCHC: 30.8 g/dL (ref 30.0–36.0)
MCV: 89.1 fL (ref 80.0–100.0)
MONOS PCT: 11 %
Monocytes Absolute: 1.3 10*3/uL — ABNORMAL HIGH (ref 0.1–1.0)
NEUTROS PCT: 71 %
Neutro Abs: 7.9 10*3/uL — ABNORMAL HIGH (ref 1.7–7.7)
Platelets: 307 10*3/uL (ref 150–400)
RBC: 4.85 MIL/uL (ref 3.87–5.11)
RDW: 14.6 % (ref 11.5–15.5)
WBC: 11.2 10*3/uL — ABNORMAL HIGH (ref 4.0–10.5)
nRBC: 0 % (ref 0.0–0.2)

## 2019-01-26 LAB — BASIC METABOLIC PANEL
ANION GAP: 12 (ref 5–15)
BUN: 34 mg/dL — AB (ref 8–23)
CALCIUM: 8.7 mg/dL — AB (ref 8.9–10.3)
CO2: 23 mmol/L (ref 22–32)
Chloride: 107 mmol/L (ref 98–111)
Creatinine, Ser: 1.09 mg/dL — ABNORMAL HIGH (ref 0.44–1.00)
GFR calc Af Amer: 49 mL/min — ABNORMAL LOW (ref >60)
GFR calc non Af Amer: 42 mL/min — ABNORMAL LOW (ref >60)
GLUCOSE: 83 mg/dL (ref 70–99)
POTASSIUM: 4.1 mmol/L (ref 3.5–5.1)
Sodium: 142 mmol/L (ref 135–145)

## 2019-01-27 ENCOUNTER — Non-Acute Institutional Stay (SKILLED_NURSING_FACILITY): Payer: Medicare Other | Admitting: Adult Health

## 2019-01-27 ENCOUNTER — Encounter: Payer: Self-pay | Admitting: Adult Health

## 2019-01-27 DIAGNOSIS — M545 Low back pain, unspecified: Secondary | ICD-10-CM

## 2019-01-27 DIAGNOSIS — I4811 Longstanding persistent atrial fibrillation: Secondary | ICD-10-CM

## 2019-01-27 DIAGNOSIS — G629 Polyneuropathy, unspecified: Secondary | ICD-10-CM | POA: Insufficient documentation

## 2019-01-27 DIAGNOSIS — I5032 Chronic diastolic (congestive) heart failure: Secondary | ICD-10-CM | POA: Diagnosis not present

## 2019-01-27 DIAGNOSIS — I130 Hypertensive heart and chronic kidney disease with heart failure and stage 1 through stage 4 chronic kidney disease, or unspecified chronic kidney disease: Secondary | ICD-10-CM | POA: Diagnosis not present

## 2019-01-27 DIAGNOSIS — K219 Gastro-esophageal reflux disease without esophagitis: Secondary | ICD-10-CM | POA: Insufficient documentation

## 2019-01-27 DIAGNOSIS — K649 Unspecified hemorrhoids: Secondary | ICD-10-CM

## 2019-01-27 DIAGNOSIS — K5909 Other constipation: Secondary | ICD-10-CM | POA: Insufficient documentation

## 2019-01-27 DIAGNOSIS — G588 Other specified mononeuropathies: Secondary | ICD-10-CM

## 2019-01-27 DIAGNOSIS — N183 Chronic kidney disease, stage 3 unspecified: Secondary | ICD-10-CM | POA: Insufficient documentation

## 2019-01-27 DIAGNOSIS — G8929 Other chronic pain: Secondary | ICD-10-CM

## 2019-01-27 DIAGNOSIS — I442 Atrioventricular block, complete: Secondary | ICD-10-CM | POA: Diagnosis not present

## 2019-01-27 DIAGNOSIS — Z950 Presence of cardiac pacemaker: Secondary | ICD-10-CM

## 2019-01-27 DIAGNOSIS — J449 Chronic obstructive pulmonary disease, unspecified: Secondary | ICD-10-CM

## 2019-01-27 DIAGNOSIS — I639 Cerebral infarction, unspecified: Secondary | ICD-10-CM

## 2019-01-27 NOTE — Progress Notes (Signed)
Location:   [facility] Room Number: [place] of Service:  SNF (31)   CODE STATUS: DNR  Allergies  Allergen Reactions  . Sulfur Swelling and Rash    Chief Complaint  Patient presents with  . Medical Management of Chronic Issues    Chronic diastolic heart failure; longstanding persistent atrial fibrillation; hypertensive heart and kidney disease with chronic diastolic congestive heart failure and stage 3 chronic kidney disease     HPI:  She is a 85 year old long term resident of this facility being seen for the management of her chronic illnesses: heart failure; hypertensive heart disease; afib. She denies any chest pain. She states that her breathing and cough are improving. She is presently on levaquin and prednisone taper for bronchitis. There are no reports of fevers present. She has a good appetite.   Past Medical History:  Diagnosis Date  . Atrial fibrillation ([place])   . Bradycardia   . Breast nodule 11/15/2014  . Breast pain, left 11/15/2014  . Carotid artery disease ([place])   . Dizziness   . Dyspnea    Previous CPX suggesting possible restrictive physiology, respiratory muscle fatigue, diastolic dysfunction  . Essential hypertension, benign   . Hyperlipidemia   . Oxygen dependent    2 liter  . Pacemaker [place]   . Rib pain on left side 11/15/2014  . Seasonal allergies   . Shingles 05/04/2015  . Stroke ([place])   . Toe fracture, left    left big toe    Past Surgical History:  Procedure Laterality Date  . COLONOSCOPY  02/22/2012   Procedure: COLONOSCOPY;  Surgeon: Rogene Houston, MD;  [facility];  Service: Endoscopy;  Laterality: N/A;  100  . KNEE SURGERY     bilateral  . PACEMAKER IMPLANT N/A 07/17/2017   Procedure: Pacemaker Implant;  Surgeon: Deboraha Sprang, MD;  Location: [facility];  Service: Cardiovascular;  Laterality: N/A;    Social History   Socioeconomic History  . Marital status: Widowed    Spouse  name: Not on file  . Number of children: Not on file  . Years of education: Not on file  . Highest education level: Not on file  Occupational History  . Not on file  Social Needs  . Financial resource strain: Not hard at all  . Food insecurity:    Worry: Never true    Inability: Never true  . Transportation needs:    Medical: No    Non-medical: No  Tobacco Use  . Smoking status: Never Smoker  . Smokeless tobacco: Never Used  Substance and Sexual Activity  . Alcohol use: No  . Drug use: No  . Sexual activity: Never    Birth control/protection: Post-menopausal  Lifestyle  . Physical activity:    Days per week: 0 days    Minutes per session: 0 min  . Stress: Not at all  Relationships  . Social connections:    Talks on phone: Three times a week    Gets together: Twice a week    Attends religious service: Never    Active member of club or organization: No    Attends meetings of clubs or organizations: Never    Relationship status: Widowed  . Intimate partner violence:    Fear of current or ex partner: No    Emotionally abused: No    Physically abused: No    Forced sexual activity: No  Other Topics Concern  .  Not on file  Social History Narrative  . Not on file   Family History  Problem Relation Age of Onset  . Stroke Mother   . Pneumonia Father   . Heart disease Father   . Healthy Son   . Glaucoma Daughter   . Emphysema Daughter   . Healthy Daughter   . Other Daughter        had knee replacement  . Healthy Son   . Heart disease Maternal Grandmother       VITAL SIGNS BP 139/65   Pulse 60   Temp (!) 97 F (36.1 C)   Resp 20   Ht 5\' 4"  (1.626 m)   Wt 152 lb 6.4 oz (69.1 kg)   SpO2 97%   BMI 26.16 kg/m   Outpatient Encounter Medications as of 01/27/2019  Medication Sig  . acetaminophen (TYLENOL) 500 MG tablet Take 1,000 mg by mouth 2 (two) times daily. Max of 3 grams acetaminophen per 24 hrs from all sources.  Marland Kitchen albuterol (PROVENTIL HFA;VENTOLIN HFA)  108 (90 Base) MCG/ACT inhaler Inhale 2 puffs into the lungs at bedtime. For SOB or wheezing  . amLODipine (NORVASC) 2.5 MG tablet Take 2.5 mg by mouth daily.  Jearl Klinefelter ELLIPTA 62.5-25 MCG/INH AEPB Inhale 1 Inhaler into the lungs daily.   Marland Kitchen apixaban (ELIQUIS) 2.5 MG TABS tablet Take 1 tablet (2.5 mg total) by mouth 2 (two) times daily. Resume 07/20/17  . artificial tears (LACRILUBE) OINT ophthalmic ointment Place 1 application into both eyes at bedtime.  Marland Kitchen atorvastatin (LIPITOR) 80 MG tablet Take 1 tablet (80 mg total) by mouth daily at 6 PM.  . cycloSPORINE (RESTASIS) 0.05 % ophthalmic emulsion Place 1 drop into both eyes every 12 (twelve) hours.   . diclofenac sodium (VOLTAREN) 1 % GEL Apply 1 application topically 4 (four) times daily as needed. Add to body where needed  . diltiazem (CARDIZEM CD) 180 MG 24 hr capsule TAKE ONE CAPSULE BY MOUTH ONCE DAILY.  Marland Kitchen gabapentin (NEURONTIN) 100 MG capsule Take 100 mg by mouth 2 (two) times daily.  . hydrocortisone (ANUSOL-HC) 25 MG suppository Place 25 mg rectally daily.  Marland Kitchen ipratropium-albuterol (DUONEB) 0.5-2.5 (3) MG/3ML SOLN Take 3 mLs by nebulization every 6 (six) hours as needed. Starting on 01/25/2019  For wheezing or SOB  . levofloxacin (LEVAQUIN) 250 MG tablet Take 250 mg by mouth daily. For 6 days due to URI  . Melatonin 5 MG TABS Take 5 mg by mouth at bedtime as needed.  . Multiple Vitamin (MULITIVITAMIN WITH MINERALS) TABS Take 1 tablet by mouth every other day.   . NON FORMULARY Diet Type:  Regular  . OXYGEN Inhale 2 L into the lungs 2 (two) times daily. 3:15pm to 11:15pm 11:15pm to 07:15am  . pantoprazole (PROTONIX) 40 MG tablet Take 40 mg by mouth daily.  Vladimir Faster Glycol-Propyl Glycol (SYSTANE OP) Place 1 drop into both eyes 3 (three) times daily. For dry eyes  . polyethylene glycol (MIRALAX / GLYCOLAX) packet Take 17 g by mouth daily. For constipation  . potassium chloride (K-DUR,KLOR-CON) 10 MEQ tablet Take 20 mEq by mouth daily.  .  predniSONE (DELTASONE) 20 MG tablet Take 20 mg by mouth daily with breakfast. Give 40 mg by mouth once a day from 01/19/2019-01/24/2019. Then give 30 mg by mouth once a day from 01/25/2019-01/29/2019. Then give 20 mg by mouth once a day from 01/30/2019-2/25-2020. Then take 10 mg by mouth once a day from 02/04/2019-02/08/2019. Then take 5 mg by  mouth once a day from 02/09/2019-02/13/2019.  Marland Kitchen PROAIR HFA 108 (90 BASE) MCG/ACT inhaler Inhale 2 puffs into the lungs 2 (two) times daily as needed for wheezing or shortness of breath.   . Probiotic Product (RISA-BID PROBIOTIC) TABS One tablet by mouth twice a day for 10 days from 01/21/2019-01/31/2019  . traMADol (ULTRAM) 50 MG tablet Take 1 tablet (50 mg total) by mouth 3 (three) times daily.   No facility-administered encounter medications on file as of 01/27/2019.      SIGNIFICANT DIAGNOSTIC EXAMS  LABS REVIEWED:   12-07-17: chol 113; ldl 48  trig 47 hdl 56  01-26-19: wbc 11.2; hgb  13.3; hct 43.2; mcv 89.1 plt 307; glucose 83; bun 34; creat 1.00; k+ 4.1; na++ 142; ca 8.7   Review of Systems  Constitutional: Negative for malaise/fatigue.  Respiratory: Positive for cough and shortness of breath.        Has chronic cough and chronic DOE   Cardiovascular: Positive for leg swelling. Negative for chest pain and palpitations.  Gastrointestinal: Negative for abdominal pain, constipation and heartburn.  Musculoskeletal: Negative for back pain, joint pain and myalgias.  Skin: Negative.   Neurological: Negative for dizziness.  Psychiatric/Behavioral: The patient is not nervous/anxious.     Physical Exam Constitutional:      General: She is not in acute distress.    Appearance: She is well-developed. She is not diaphoretic.  Neck:     Musculoskeletal: Neck supple.     Thyroid: No thyromegaly.  Cardiovascular:     Rate and Rhythm: Normal rate and regular rhythm.     Pulses: Normal pulses.     Heart sounds: Normal heart sounds.     Comments: Psychologist, forensic present    Pulmonary:     Effort: Pulmonary effort is normal. No respiratory distress.     Comments: Breath sounds diminished 02 dependent  Abdominal:     General: Bowel sounds are normal. There is no distension.     Palpations: Abdomen is soft.     Tenderness: There is no abdominal tenderness.  Musculoskeletal:     Right lower leg: Edema present.     Left lower leg: Edema present.     Comments: 1-2+ bilateral lower extremity edema   Lymphadenopathy:     Cervical: No cervical adenopathy.  Skin:    General: Skin is warm and dry.  Neurological:     Mental Status: She is alert. Mental status is at baseline.  Psychiatric:        Mood and Affect: Mood normal.       ASSESSMENT/ PLAN:  TODAY;   1. Chronic diastolic heart failure: EF 60-65% (07-09-17)  is stable presently off diuretics will monitor   2. Longstanding persistent atrial fibrillation: status post pace maker heart rate stable; will continue cardizem cd 180 mg daily for rate control; eliquis 2.5 mg twice daily   3. 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 Complete heart block: status post [employer] PTVDP: Psychologist, forensic; is stable will monitor  5. CVA (crebral vascular accident) is neurologically stable: will continue elquis 2.5 mg twice daily   6. Chronic obstructive pulmonary disease: is stable is 02 dependent; will continue anoro 62.5/25 mcg 1 puff daily albuterol 2 puffs nightly is on long term prednisone therapy at 5 mg daily and duoneb every 6 hours as needed   7.  Hemorrhoids: is stable will continue anusol HC 25 mg supp daily   8. Chronic kidney disease stage 3 (moderate): is stable bun 34; creat 1.00  9. GERD without esophagitis: is stable will continue protonix 40 mg daily   10. Chronic constipation: is stable will miralax daily   11. Hypokalemia: is stable k+ 4.1 will stop k dur  at this time will monitor  12. Dyslipidemia: is stable will stop lipitor due to advanced age   71. Chronic bilateral low back pain without sciatica: is stable tylenol 1 gm twice daily   14. Peripheral neuropathy: is stable will continue neurontin 100 mg twice daily   15. Bronchitis: is improving: will complete levaquin and prednisone taper.    MD is aware of resident's narcotic use and is in agreement with current plan of care. We will attempt to wean resident as apropriate   Ok Edwards NP [facility]  Contact [phone] Monday through Friday 8am- 5pm  After hours call [phone]

## 2019-02-02 NOTE — Progress Notes (Signed)
Remote pacemaker transmission.   

## 2019-02-09 ENCOUNTER — Other Ambulatory Visit: Payer: Self-pay | Admitting: Adult Health

## 2019-02-09 MED ORDER — TRAMADOL HCL 50 MG PO TABS: 50.0000 mg | ORAL_TABLET | Freq: Three times a day (TID) | ORAL | 0 refills | Status: DC

## 2019-02-13 ENCOUNTER — Non-Acute Institutional Stay (SKILLED_NURSING_FACILITY): Payer: Medicare Other | Admitting: Adult Health

## 2019-02-13 ENCOUNTER — Encounter: Payer: Self-pay | Admitting: Adult Health

## 2019-02-13 DIAGNOSIS — I5032 Chronic diastolic (congestive) heart failure: Secondary | ICD-10-CM | POA: Diagnosis not present

## 2019-02-13 NOTE — Progress Notes (Signed)
Location:   [facility] Room Number: [place] of Service:  SNF (31)   CODE STATUS: DNR  Allergies  Allergen Reactions  . Sulfur Swelling and Rash    Chief Complaint  Patient presents with  . Acute Visit    Weight Gain    HPI:  She has been gaining weight from 203 pounds on 02-09-19: with her weight on 02-13-19 212 pounds. She does have increased edema; shortness of breath denies any cough or chest pain. There are no reports of fevers present   Past Medical History:  Diagnosis Date  . Atrial fibrillation ([place])   . Bradycardia   . Breast nodule 11/15/2014  . Breast pain, left 11/15/2014  . Carotid artery disease ([place])   . Dizziness   . Dyspnea    Previous CPX suggesting possible restrictive physiology, respiratory muscle fatigue, diastolic dysfunction  . Essential hypertension, benign   . Hyperlipidemia   . Oxygen dependent    2 liter  . Pacemaker [place]   . Rib pain on left side 11/15/2014  . Seasonal allergies   . Shingles 05/04/2015  . Stroke ([place])   . Toe fracture, left    left big toe    Past Surgical History:  Procedure Laterality Date  . COLONOSCOPY  02/22/2012   Procedure: COLONOSCOPY;  Surgeon: Rogene Houston, MD;  [facility];  Service: Endoscopy;  Laterality: N/A;  100  . KNEE SURGERY     bilateral  . PACEMAKER IMPLANT N/A 07/17/2017   Procedure: Pacemaker Implant;  Surgeon: Deboraha Sprang, MD;  Location: [facility] LAB;  Service: Cardiovascular;  Laterality: N/A;    Social History   Socioeconomic History  . Marital status: Widowed    Spouse name: Not on file  . Number of children: Not on file  . Years of education: Not on file  . Highest education level: Not on file  Occupational History  . Not on file  Social Needs  . Financial resource strain: Not hard at all  . Food insecurity:    Worry: Never true    Inability: Never true  . Transportation needs:    Medical: No    Non-medical: No  Tobacco  Use  . Smoking status: Never Smoker  . Smokeless tobacco: Never Used  Substance and Sexual Activity  . Alcohol use: No  . Drug use: No  . Sexual activity: Never    Birth control/protection: Post-menopausal  Lifestyle  . Physical activity:    Days per week: 0 days    Minutes per session: 0 min  . Stress: Not at all  Relationships  . Social connections:    Talks on phone: Three times a week    Gets together: Twice a week    Attends religious service: Never    Active member of club or organization: No    Attends meetings of clubs or organizations: Never    Relationship status: Widowed  . Intimate partner violence:    Fear of current or ex partner: No    Emotionally abused: No    Physically abused: No    Forced sexual activity: No  Other Topics Concern  . Not on file  Social History Narrative  . Not on file   Family History  Problem Relation Age of Onset  . Stroke Mother   . Pneumonia Father   . Heart disease Father   . Healthy Son   . Glaucoma Daughter   .  Emphysema Daughter   . Healthy Daughter   . Other Daughter        had knee replacement  . Healthy Son   . Heart disease Maternal Grandmother       VITAL SIGNS BP 133/64   Pulse 60   Temp (!) 97.5 F (36.4 C)   Resp 18   Ht 5\' 4"  (1.626 m)   Wt 152 lb 9.6 oz (69.2 kg)   SpO2 97%   BMI 26.19 kg/m   Outpatient Encounter Medications as of 02/13/2019  Medication Sig  . acetaminophen (TYLENOL) 500 MG tablet Take 1,000 mg by mouth 2 (two) times daily. Max of 3 grams acetaminophen per 24 hrs from all sources.  Marland Kitchen albuterol (PROVENTIL HFA;VENTOLIN HFA) 108 (90 Base) MCG/ACT inhaler Inhale 2 puffs into the lungs at bedtime. For SOB or wheezing  . amLODipine (NORVASC) 2.5 MG tablet Take 2.5 mg by mouth daily.  Jearl Klinefelter ELLIPTA 62.5-25 MCG/INH AEPB Inhale 1 Inhaler into the lungs daily.   Marland Kitchen apixaban (ELIQUIS) 2.5 MG TABS tablet Take 1 tablet (2.5 mg total) by mouth 2 (two) times daily. Resume 07/20/17  . artificial  tears (LACRILUBE) OINT ophthalmic ointment Place 1 application into both eyes at bedtime.  . cycloSPORINE (RESTASIS) 0.05 % ophthalmic emulsion Place 1 drop into both eyes every 12 (twelve) hours.   . diclofenac sodium (VOLTAREN) 1 % GEL Apply 1 application topically 4 (four) times daily as needed. Add to body where needed  . diltiazem (CARDIZEM CD) 180 MG 24 hr capsule TAKE ONE CAPSULE BY MOUTH ONCE DAILY.  Marland Kitchen gabapentin (NEURONTIN) 100 MG capsule Take 100 mg by mouth 2 (two) times daily.  . hydrocortisone (ANUSOL-HC) 25 MG suppository Place 25 mg rectally daily.  Marland Kitchen ipratropium-albuterol (DUONEB) 0.5-2.5 (3) MG/3ML SOLN Take 3 mLs by nebulization every 6 (six) hours as needed. Starting on 01/25/2019  For wheezing or SOB  . Melatonin 5 MG TABS Take 5 mg by mouth at bedtime as needed.  . Multiple Vitamin (MULITIVITAMIN WITH MINERALS) TABS Take 1 tablet by mouth every other day.   . NON FORMULARY Diet Type:  Regular  . oseltamivir (TAMIFLU) 30 MG capsule Take 30 mg by mouth at bedtime.  . OXYGEN Inhale 2 L into the lungs 2 (two) times daily. 3:15pm to 11:15pm 11:15pm to 07:15am  . pantoprazole (PROTONIX) 40 MG tablet Take 40 mg by mouth daily.  Vladimir Faster Glycol-Propyl Glycol (SYSTANE OP) Place 1 drop into both eyes 3 (three) times daily. For dry eyes  . polyethylene glycol (MIRALAX / GLYCOLAX) packet Take 17 g by mouth daily as needed. For constipation  . predniSONE (DELTASONE) 20 MG tablet Take 20 mg by mouth daily with breakfast. Give 40 mg by mouth once a day from 01/19/2019-01/24/2019. Then give 30 mg by mouth once a day from 01/25/2019-01/29/2019. Then give 20 mg by mouth once a day from 01/30/2019-2/25-2020. Then take 10 mg by mouth once a day from 02/04/2019-02/08/2019. Then take 5 mg by mouth once a day from 02/09/2019-02/13/2019.  . traMADol (ULTRAM) 50 MG tablet Take 1 tablet (50 mg total) by mouth 3 (three) times daily.   No facility-administered encounter medications on file as of 02/13/2019.       SIGNIFICANT DIAGNOSTIC EXAMS  LABS REVIEWED: PREVIOUS   12-07-17: chol 113; ldl 48  trig 47 hdl 56  01-26-19: wbc 11.2; hgb  13.3; hct 43.2; mcv 89.1 plt 307; glucose 83; bun 34; creat 1.00; k+ 4.1; na++ 142; ca 8.7  NO NEW LABS.   Review of Systems  Constitutional: Negative for malaise/fatigue.  Respiratory: Positive for shortness of breath. Negative for cough.   Cardiovascular: Positive for leg swelling. Negative for chest pain and palpitations.  Gastrointestinal: Negative for abdominal pain, constipation and heartburn.  Musculoskeletal: Negative for back pain, joint pain and myalgias.  Skin: Negative.   Neurological: Negative for dizziness.  Psychiatric/Behavioral: The patient is not nervous/anxious.    Physical Exam Constitutional:      General: She is not in acute distress.    Appearance: She is well-developed. She is not diaphoretic.  Neck:     Thyroid: No thyromegaly.  Cardiovascular:     Rate and Rhythm: Regular rhythm. Bradycardia present.     Pulses: Normal pulses.     Heart sounds: Normal heart sounds.     Comments: Pace maker present  Is slightly bradycardic  Pulmonary:     Effort: Pulmonary effort is normal. No respiratory distress.     Breath sounds: Normal breath sounds.     Comments:  Breath sounds diminished 02 dependent  Abdominal:     General: Bowel sounds are normal. There is no distension.     Palpations: Abdomen is soft.     Tenderness: There is no abdominal tenderness.  Musculoskeletal:     Right lower leg: Edema present.     Left lower leg: Edema present.     Comments: Is able to move all extremities Has 3+ bilateral lower extremity edema   Lymphadenopathy:     Cervical: No cervical adenopathy.  Skin:    General: Skin is warm and dry.  Neurological:     Mental Status: She is alert. Mental status is at baseline.  Psychiatric:        Mood and Affect: Mood normal.      ASSESSMENT/ PLAN:  TODAY;   1. Chronic diastolic heart  failure: EF 60-65% (07-09-17)  is worse will begin lasix 20 mg daily and will continue to monitor her status. Will check bmp on 02-20-19   MD is aware of resident's narcotic use and is in agreement with current plan of care. We will attempt to wean resident as apropriate   Ok Edwards NP [facility]  Contact [phone] Monday through Friday 8am- 5pm  After hours call [phone]

## 2019-02-19 ENCOUNTER — Encounter: Payer: Self-pay | Admitting: Adult Health

## 2019-02-19 ENCOUNTER — Non-Acute Institutional Stay (SKILLED_NURSING_FACILITY): Payer: Medicare Other | Admitting: Adult Health

## 2019-02-19 DIAGNOSIS — K219 Gastro-esophageal reflux disease without esophagitis: Secondary | ICD-10-CM

## 2019-02-19 NOTE — Progress Notes (Signed)
Location:   [place] Room Number: [place] of Service:  SNF (31)   CODE STATUS: DNR  Allergies  Allergen Reactions  . Sulfur Swelling and Rash    Chief Complaint  Patient presents with  . Acute Visit    Heart Burn    HPI:  For the past 1-2 weeks she has had worsening heart burn. She states that she has it worse at night. Does not note any food making it worse. No changes in appetite; no insomnia.   Past Medical History:  Diagnosis Date  . Atrial fibrillation ([place])   . Bradycardia   . Breast nodule 11/15/2014  . Breast pain, left 11/15/2014  . Carotid artery disease ([place])   . Dizziness   . Dyspnea    Previous CPX suggesting possible restrictive physiology, respiratory muscle fatigue, diastolic dysfunction  . Essential hypertension, benign   . Hyperlipidemia   . Oxygen dependent    2 liter  . Pacemaker [place]   . Rib pain on left side 11/15/2014  . Seasonal allergies   . Shingles 05/04/2015  . Stroke ([place])   . Toe fracture, left    left big toe    Past Surgical History:  Procedure Laterality Date  . COLONOSCOPY  02/22/2012   Procedure: COLONOSCOPY;  Surgeon: Rogene Houston, MD;  [facility];  Service: Endoscopy;  Laterality: N/A;  100  . KNEE SURGERY     bilateral  . PACEMAKER IMPLANT N/A 07/17/2017   Procedure: Pacemaker Implant;  Surgeon: Deboraha Sprang, MD;  Location: [facility] CV LAB;  Service: Cardiovascular;  Laterality: N/A;    Social History   Socioeconomic History  . Marital status: Widowed    Spouse name: Not on file  . Number of children: Not on file  . Years of education: Not on file  . Highest education level: Not on file  Occupational History  . Not on file  Social Needs  . Financial resource strain: Not hard at all  . Food insecurity:    Worry: Never true    Inability: Never true  . Transportation needs:    Medical: No    Non-medical: No  Tobacco Use  . Smoking status: Never Smoker  .  Smokeless tobacco: Never Used  Substance and Sexual Activity  . Alcohol use: No  . Drug use: No  . Sexual activity: Never    Birth control/protection: Post-menopausal  Lifestyle  . Physical activity:    Days per week: 0 days    Minutes per session: 0 min  . Stress: Not at all  Relationships  . Social connections:    Talks on phone: Three times a week    Gets together: Twice a week    Attends religious service: Never    Active member of club or organization: No    Attends meetings of clubs or organizations: Never    Relationship status: Widowed  . Intimate partner violence:    Fear of current or ex partner: No    Emotionally abused: No    Physically abused: No    Forced sexual activity: No  Other Topics Concern  . Not on file  Social History Narrative  . Not on file   Family History  Problem Relation Age of Onset  . Stroke Mother   . Pneumonia Father   . Heart disease Father   . Healthy Son   . Glaucoma Daughter   . Emphysema Daughter   .  Healthy Daughter   . Other Daughter        had knee replacement  . Healthy Son   . Heart disease Maternal Grandmother       VITAL SIGNS BP 138/82   Pulse 76   Temp 97.8 F (36.6 C)   Resp 20   Ht 5\' 4"  (1.626 m)   Wt 148 lb 6.4 oz (67.3 kg)   SpO2 98%   BMI 25.47 kg/m   Outpatient Encounter Medications as of 02/19/2019  Medication Sig  . acetaminophen (TYLENOL) 500 MG tablet Take 1,000 mg by mouth 2 (two) times daily. Max of 3 grams acetaminophen per 24 hrs from all sources.  Marland Kitchen albuterol (PROVENTIL HFA;VENTOLIN HFA) 108 (90 Base) MCG/ACT inhaler Inhale 2 puffs into the lungs at bedtime. For SOB or wheezing  . amLODipine (NORVASC) 2.5 MG tablet Take 2.5 mg by mouth daily.  Jearl Klinefelter ELLIPTA 62.5-25 MCG/INH AEPB Inhale 1 puff into the lungs daily.   Marland Kitchen apixaban (ELIQUIS) 2.5 MG TABS tablet Take 1 tablet (2.5 mg total) by mouth 2 (two) times daily. Resume 07/20/17  . artificial tears (LACRILUBE) OINT ophthalmic ointment Place  1 application into both eyes at bedtime.  . cycloSPORINE (RESTASIS) 0.05 % ophthalmic emulsion Place 1 drop into both eyes every 12 (twelve) hours.   . diclofenac sodium (VOLTAREN) 1 % GEL Apply 1 application topically 4 (four) times daily as needed. Add to body where needed  . diltiazem (CARDIZEM CD) 180 MG 24 hr capsule TAKE ONE CAPSULE BY MOUTH ONCE DAILY.  . furosemide (LASIX) 20 MG tablet Take 20 mg by mouth daily.  Marland Kitchen gabapentin (NEURONTIN) 100 MG capsule Take 100 mg by mouth 2 (two) times daily.  . hydrocortisone (ANUSOL-HC) 25 MG suppository Place 25 mg rectally daily.  Marland Kitchen ipratropium-albuterol (DUONEB) 0.5-2.5 (3) MG/3ML SOLN Take 3 mLs by nebulization every 6 (six) hours as needed. Starting on 01/25/2019  For wheezing or SOB  . Melatonin 5 MG TABS Take 5 mg by mouth at bedtime as needed.  . Multiple Vitamin (MULITIVITAMIN WITH MINERALS) TABS Take 1 tablet by mouth every other day.   . NON FORMULARY Diet Type:  Regular  . OXYGEN Inhale 2 L into the lungs 2 (two) times daily. 3:15pm to 11:15pm 11:15pm to 07:15am  . pantoprazole (PROTONIX) 40 MG tablet Take 40 mg by mouth daily.  Vladimir Faster Glycol-Propyl Glycol (SYSTANE OP) Place 1 drop into both eyes 3 (three) times daily. For dry eyes  . polyethylene glycol (MIRALAX / GLYCOLAX) packet Take 17 g by mouth daily as needed. For constipation  . traMADol (ULTRAM) 50 MG tablet Take 1 tablet (50 mg total) by mouth 3 (three) times daily.  . [DISCONTINUED] Albuterol (VENTOLIN IN) Inhale into the lungs.    . [DISCONTINUED] predniSONE (DELTASONE) 20 MG tablet Take 20 mg by mouth daily with breakfast. Give 40 mg by mouth once a day from 01/19/2019-01/24/2019. Then give 30 mg by mouth once a day from 01/25/2019-01/29/2019. Then give 20 mg by mouth once a day from 01/30/2019-2/25-2020. Then take 10 mg by mouth once a day from 02/04/2019-02/08/2019. Then take 5 mg by mouth once a day from 02/09/2019-02/13/2019.   No facility-administered encounter medications on file  as of 02/19/2019.      SIGNIFICANT DIAGNOSTIC EXAMS   LABS REVIEWED: PREVIOUS   12-07-17: chol 113; ldl 48  trig 47 hdl 56  01-26-19: wbc 11.2; hgb  13.3; hct 43.2; mcv 89.1 plt 307; glucose 83; bun 34; creat 1.00;  k+ 4.1; na++ 142; ca 8.7   NO NEW LABS.    Review of Systems  Constitutional: Negative for malaise/fatigue.  Respiratory: Negative for cough and shortness of breath.   Cardiovascular: Negative for chest pain, palpitations and leg swelling.  Gastrointestinal: Positive for heartburn. Negative for abdominal pain and constipation.  Musculoskeletal: Negative for back pain, joint pain and myalgias.  Skin: Negative.   Neurological: Negative for dizziness.  Psychiatric/Behavioral: The patient is not nervous/anxious.       Physical Exam Constitutional:      General: She is not in acute distress.    Appearance: She is well-developed. She is not diaphoretic.  Neck:     Musculoskeletal: Neck supple.     Thyroid: No thyromegaly.  Cardiovascular:     Rate and Rhythm: Normal rate and regular rhythm.     Pulses: Normal pulses.     Heart sounds: Normal heart sounds.     Comments:  Psychologist, forensic present  Pulmonary:     Effort: Pulmonary effort is normal. No respiratory distress.     Breath sounds: Normal breath sounds.     Comments: 02 dependent  Abdominal:     General: Bowel sounds are normal. There is no distension.     Palpations: Abdomen is soft.     Tenderness: There is no abdominal tenderness.  Musculoskeletal:     Right lower leg: Edema present.     Left lower leg: Edema present.     Comments:  Is able to move all extremities Has 2+ bilateral lower extremity edema  Wears ted hose     Lymphadenopathy:     Cervical: No cervical adenopathy.  Skin:    General: Skin is warm and dry.  Neurological:     Mental Status: She is alert. Mental status is at baseline.  Psychiatric:        Mood and Affect: Mood normal.       ASSESSMENT/ PLAN:  TODAY:   1. GERD  without esophagitis: is worse: will increase protonix to 40 mg twice daily and will monitor   MD is aware of resident's narcotic use and is in agreement with current plan of care. We will attempt to wean resident as apropriate   Ok Edwards NP [facility]  Contact [phone] Monday through Friday 8am- 5pm  After hours call [phone]

## 2019-02-20 ENCOUNTER — Other Ambulatory Visit: Payer: Self-pay | Admitting: Adult Health

## 2019-02-20 MED ORDER — HYDROCORTISONE ACETATE 25 MG RE SUPP: 25.0000 mg | Freq: Every day | RECTAL | 3 refills | Status: DC

## 2019-02-21 ENCOUNTER — Other Ambulatory Visit (HOSPITAL_COMMUNITY)
Admission: RE | Admit: 2019-02-21 | Discharge: 2019-02-21 | Disposition: A | Discharge status: Home / Self Care | Payer: Medicare Other | Source: Other Acute Inpatient Hospital | Attending: Adult Health | Admitting: Adult Health

## 2019-02-21 DIAGNOSIS — I110 Hypertensive heart disease with heart failure: Secondary | ICD-10-CM | POA: Diagnosis present

## 2019-02-21 LAB — BASIC METABOLIC PANEL
Anion gap: 9 (ref 5–15)
BUN: 20 mg/dL (ref 8–23)
CHLORIDE: 107 mmol/L (ref 98–111)
CO2: 25 mmol/L (ref 22–32)
Calcium: 9.1 mg/dL (ref 8.9–10.3)
Creatinine, Ser: 0.91 mg/dL (ref 0.44–1.00)
GFR calc Af Amer: >60 mL/min (ref >60)
GFR, EST NON AFRICAN AMERICAN: 53 mL/min — AB (ref >60)
GLUCOSE: 90 mg/dL (ref 70–99)
POTASSIUM: 4 mmol/L (ref 3.5–5.1)
Sodium: 141 mmol/L (ref 135–145)

## 2019-02-26 ENCOUNTER — Encounter: Payer: Self-pay | Admitting: Adult Health

## 2019-02-26 ENCOUNTER — Non-Acute Institutional Stay (SKILLED_NURSING_FACILITY): Payer: Medicare Other | Admitting: Adult Health

## 2019-02-26 DIAGNOSIS — I130 Hypertensive heart and chronic kidney disease with heart failure and stage 1 through stage 4 chronic kidney disease, or unspecified chronic kidney disease: Secondary | ICD-10-CM | POA: Diagnosis not present

## 2019-02-26 DIAGNOSIS — I5032 Chronic diastolic (congestive) heart failure: Secondary | ICD-10-CM

## 2019-02-26 DIAGNOSIS — N183 Chronic kidney disease, stage 3 unspecified: Secondary | ICD-10-CM

## 2019-02-26 DIAGNOSIS — I442 Atrioventricular block, complete: Secondary | ICD-10-CM

## 2019-02-26 DIAGNOSIS — J449 Chronic obstructive pulmonary disease, unspecified: Secondary | ICD-10-CM

## 2019-02-26 DIAGNOSIS — I639 Cerebral infarction, unspecified: Secondary | ICD-10-CM

## 2019-02-26 NOTE — Progress Notes (Signed)
Provider:  Ok Edwards, NP Location:  [facility] Room Number: [place] of Service:  SNF ([phone])   PCP: Virgie Dad, MD Patient Care Team: Virgie Dad, MD as PCP - General (Internal Medicine) Rolm Baptise as Physician Assistant (Internal Medicine)  Extended Emergency Contact Information Primary Emergency Contact: [place], [place], [place] [address] [name] of [place] Phone: [phone] Relation: Son Secondary Emergency Contact: [name], [place] [address] [name] of [place] Phone: [phone] Mobile Phone: [phone] Relation: Son  Code Status: DNR Goals of Care: Advanced Directive information Advanced Directives 02/26/2019  Does Patient Have a Medical Advance Directive? Yes  Type of Advance Directive Out of facility DNR (pink MOST or yellow form)  Does patient want to make changes to medical advance directive? No - Patient declined  Copy of [place] in Chart? -  Would patient like information on creating a medical advance directive? No - Patient declined  Pre-existing out of facility DNR order (yellow form or pink MOST form) Yellow form placed in chart (order not valid for inpatient use)      Allergies  Allergen Reactions  . Sulfur Swelling and Rash     Chief Complaint  Patient presents with  . Annual Exam        HPI: Patient is a 85 y.o. female seen today for an annual comprehensive examination. She had not had a fall; no hospitalizations over the past year. No significant chagnes in her weight. There are no report of uncontrolled pain; no anxiety or agitation. She continues to be followed for her chronic illnesses including: heart failure; cva; copd.   Past Medical History:  Diagnosis Date  . Atrial fibrillation ([place])   . Bradycardia   . Breast nodule 11/15/2014  . Breast pain, left 11/15/2014  . Carotid artery disease ([place])   . Dizziness   .  Dyspnea    Previous CPX suggesting possible restrictive physiology, respiratory muscle fatigue, diastolic dysfunction  . Essential hypertension, benign   . Hyperlipidemia   . Oxygen dependent    2 liter  . Pacemaker [place]   . Rib pain on left side 11/15/2014  . Seasonal allergies   . Shingles 05/04/2015  . Stroke ([place])   . Toe fracture, left    left big toe   Past Surgical History:  Procedure Laterality Date  . COLONOSCOPY  02/22/2012   Procedure: COLONOSCOPY;  Surgeon: Rogene Houston, MD;  [facility];  Service: Endoscopy;  Laterality: N/A;  100  . KNEE SURGERY     bilateral  . PACEMAKER IMPLANT N/A 07/17/2017   Procedure: Pacemaker Implant;  Surgeon: Deboraha Sprang, MD;  Location: [facility] CV LAB;  Service: Cardiovascular;  Laterality: N/A;    reports that she has never smoked. She has never used smokeless tobacco. She reports that she does not drink alcohol or use drugs. Social History   Socioeconomic History  . Marital status: Widowed    Spouse name: Not on file  . Number of children: Not on file  . Years of education: Not on file  . Highest education level: Not on file  Occupational History  . Not on file  Social Needs  . Financial resource strain: Not hard at all  . Food insecurity:    Worry: Never true    Inability: Never true  .  Transportation needs:    Medical: No    Non-medical: No  Tobacco Use  . Smoking status: Never Smoker  . Smokeless tobacco: Never Used  Substance and Sexual Activity  . Alcohol use: No  . Drug use: No  . Sexual activity: Never    Birth control/protection: Post-menopausal  Lifestyle  . Physical activity:    Days per week: 0 days    Minutes per session: 0 min  . Stress: Not at all  Relationships  . Social connections:    Talks on phone: Three times a week    Gets together: Twice a week    Attends religious service: Never    Active member of club or organization: No    Attends meetings of clubs or organizations:  Never    Relationship status: Widowed  . Intimate partner violence:    Fear of current or ex partner: No    Emotionally abused: No    Physically abused: No    Forced sexual activity: No  Other Topics Concern  . Not on file  Social History Narrative  . Not on file   Family History  Problem Relation Age of Onset  . Stroke Mother   . Pneumonia Father   . Heart disease Father   . Healthy Son   . Glaucoma Daughter   . Emphysema Daughter   . Healthy Daughter   . Other Daughter        had knee replacement  . Healthy Son   . Heart disease Maternal Grandmother     Vitals:   02/26/19 1526  BP: 138/82  Pulse: 76  Resp: 20  Temp: 97.7 F (36.5 C)  SpO2: 97%  Weight: 150 lb 9.6 oz (68.3 kg)  Height: 5\' 4"  (1.626 m)   Body mass index is 25.85 kg/m.   Outpatient Encounter Medications as of 02/26/2019  Medication Sig  . acetaminophen (TYLENOL) 500 MG tablet Take 1,000 mg by mouth 2 (two) times daily. Max of 3 grams acetaminophen per 24 hrs from all sources.  Marland Kitchen albuterol (PROVENTIL HFA;VENTOLIN HFA) 108 (90 Base) MCG/ACT inhaler Inhale 2 puffs into the lungs at bedtime. For SOB or wheezing  . amLODipine (NORVASC) 2.5 MG tablet Take 2.5 mg by mouth daily.  Jearl Klinefelter ELLIPTA 62.5-25 MCG/INH AEPB Inhale 1 puff into the lungs daily.   Marland Kitchen apixaban (ELIQUIS) 2.5 MG TABS tablet Take 1 tablet (2.5 mg total) by mouth 2 (two) times daily. Resume 07/20/17  . artificial tears (LACRILUBE) OINT ophthalmic ointment Place 1 application into both eyes at bedtime.  . cycloSPORINE (RESTASIS) 0.05 % ophthalmic emulsion Place 1 drop into both eyes every 12 (twelve) hours.   . diclofenac sodium (VOLTAREN) 1 % GEL Apply 1 application topically 4 (four) times daily as needed. Add to body where needed  . diltiazem (CARDIZEM CD) 180 MG 24 hr capsule TAKE ONE CAPSULE BY MOUTH ONCE DAILY.  . furosemide (LASIX) 20 MG tablet Take 20 mg by mouth daily.  Marland Kitchen gabapentin (NEURONTIN) 100 MG capsule Take 100 mg by mouth 2  (two) times daily.  . hydrocortisone (ANUSOL-HC) 25 MG suppository Place 1 suppository (25 mg total) rectally daily.  Marland Kitchen ipratropium-albuterol (DUONEB) 0.5-2.5 (3) MG/3ML SOLN Take 3 mLs by nebulization every 6 (six) hours as needed. Starting on 01/25/2019  For wheezing or SOB  . Melatonin 5 MG TABS Take 5 mg by mouth at bedtime as needed.  . Multiple Vitamin (MULITIVITAMIN WITH MINERALS) TABS Take 1 tablet by mouth every other day.   Marland Kitchen  NON FORMULARY Diet Type:  Regular  . OXYGEN Inhale 2 L into the lungs 2 (two) times daily. 3:15pm to 11:15pm 11:15pm to 07:15am  . pantoprazole (PROTONIX) 40 MG tablet Take 40 mg by mouth 2 (two) times daily.   Vladimir Faster Glycol-Propyl Glycol (SYSTANE OP) Place 1 drop into both eyes 3 (three) times daily. For dry eyes  . polyethylene glycol (MIRALAX / GLYCOLAX) packet Take 17 g by mouth daily as needed. For constipation  . traMADol (ULTRAM) 50 MG tablet Take 1 tablet (50 mg total) by mouth 3 (three) times daily.  . [DISCONTINUED] Albuterol (VENTOLIN IN) Inhale into the lungs.     No facility-administered encounter medications on file as of 02/26/2019.      SIGNIFICANT DIAGNOSTIC EXAMS  LABS REVIEWED: PREVIOUS   12-07-17: chol 113; ldl 48  trig 47 hdl 56  01-26-19: wbc 11.2; hgb  13.3; hct 43.2; mcv 89.1 plt 307; glucose 83; bun 34; creat 1.00; k+ 4.1; na++ 142; ca 8.7   TODAY;   02-21-19: glucose 83; bun 34; creat 1.09 k+ 4.0; na++ 141 8.7   Review of Systems  Constitutional: Negative for malaise/fatigue.  Respiratory: Negative for cough and shortness of breath.   Cardiovascular: Negative for chest pain, palpitations and leg swelling.  Gastrointestinal: Negative for abdominal pain, constipation and heartburn.  Musculoskeletal: Negative for back pain, joint pain and myalgias.  Skin: Negative.   Neurological: Negative for dizziness.  Psychiatric/Behavioral: The patient is not nervous/anxious.     Physical Exam Constitutional:      General: She is  not in acute distress.    Appearance: She is well-developed. She is not diaphoretic.  Neck:     Musculoskeletal: Neck supple.     Thyroid: No thyromegaly.  Cardiovascular:     Rate and Rhythm: Normal rate and regular rhythm.     Pulses: Normal pulses.     Heart sounds: Normal heart sounds.     Comments: Pacemaker  Pulmonary:     Effort: Pulmonary effort is normal. No respiratory distress.     Breath sounds: Normal breath sounds.     Comments: 02 dependent  Abdominal:     General: Bowel sounds are normal. There is no distension.     Palpations: Abdomen is soft.     Tenderness: There is no abdominal tenderness.  Musculoskeletal:     Right lower leg: Edema present.     Left lower leg: Edema present.     Comments:  Is able to move all extremities Has 1-2+ bilateral lower extremity edema  Wears ted hose    Lymphadenopathy:     Cervical: No cervical adenopathy.  Skin:    General: Skin is warm and dry.  Neurological:     Mental Status: She is alert. Mental status is at baseline.  Psychiatric:        Mood and Affect: Mood normal.       ASSESSMENT/ PLAN:  TODAY;   1. Chronic diastolic heart failure: EF 60-65% (07-09-17) is stable will continue lasix 20 mg daily   2. Longstanding persistent atrial fibrillation: status post pace maker insertion: stage: will continue cardizem cd 180 mg daily for rate control eliquis 2.5 mg trice daily   3. Hypertensive heart and kidney disease with chronic diastolic congestive heart failure and stage 3 chronic kidney disease: is stable b/p 138/82: will continue norvasc 2.5 mg daily   4. CVA (cerebral vascular accident) neurologically stable will continue eliquis 2.5 mg twice daily   5 complete  heart block: status post  [employer] PTVDP: pace maker: is stable will monitor    6. Chronic obstructive pulmonary disease: is stable is 02 dependent will continue anoro 62.5/25 mcg 1 puff daily; albuterol 2 puffs nightly and duoneb every 6 hours as neeed.     7. Hemorrhoids: is stable will continue anusol HC 25 mg supp daily   8. Chronic kidney disease stage 3 (moderate): is stable bun 34; creat 1.09  9. GERD without esophagitis: is stable will continue protonix 40 mg daily twice daily   10. Chronic constipation: is stable will miralax daily   11. Hypokalemia: is stable k+ 4.0  12. Dyslipidemia: is stable will off lipitor    13. Chronic bilateral low back pain without sciatica: is stable tylenol 1 gm twice daily   14. Peripheral neuropathy: is stable will continue neurontin 100 mg twice daily    Her health maintenance is up to date.    MD is aware of resident's narcotic use and is in agreement with current plan of care. We will wean dosage as appropriate for resident   Ok Edwards NP [facility]  Contact [phone] Monday through Friday 8am- 5pm  After hours call [phone]

## 2019-03-05 ENCOUNTER — Other Ambulatory Visit: Payer: Self-pay | Admitting: Internal Medicine

## 2019-03-05 MED ORDER — TRAMADOL HCL 50 MG PO TABS: 50.0000 mg | ORAL_TABLET | Freq: Three times a day (TID) | ORAL | 0 refills | Status: DC

## 2019-03-06 ENCOUNTER — Encounter: Payer: Self-pay | Admitting: Internal Medicine

## 2019-03-06 ENCOUNTER — Other Ambulatory Visit: Payer: Self-pay

## 2019-03-06 ENCOUNTER — Non-Acute Institutional Stay (SKILLED_NURSING_FACILITY): Payer: Medicare Other | Admitting: Internal Medicine

## 2019-03-06 DIAGNOSIS — I10 Essential (primary) hypertension: Secondary | ICD-10-CM | POA: Diagnosis not present

## 2019-03-06 DIAGNOSIS — I4811 Longstanding persistent atrial fibrillation: Secondary | ICD-10-CM | POA: Diagnosis not present

## 2019-03-06 DIAGNOSIS — I5032 Chronic diastolic (congestive) heart failure: Secondary | ICD-10-CM | POA: Diagnosis not present

## 2019-03-06 DIAGNOSIS — I442 Atrioventricular block, complete: Secondary | ICD-10-CM | POA: Diagnosis not present

## 2019-03-06 NOTE — Patient Outreach (Signed)
[facility]) Care Management  03/06/2019  Wendy Perkins 11-12-1919 [id number]   Medication Adherence to Wendy Perkins spoke with patients daughter she explain patient is at a long term nursing home at this time and they are providing with all her medications. Wendy Perkins is showing past due on Atorvastatin 80 mg under [facility] ins.   [facility] Management Direct Dial [phone]  Fax [phone] [email or web address]

## 2019-03-06 NOTE — Progress Notes (Signed)
Location:  [place] Room Number: [place] of Service:  SNF [phone]) Provider:  Veleta Miners, MD  Virgie Dad, MD  Patient Care Team: Virgie Dad, MD as PCP - General (Internal Medicine) Wille Celeste, PA-C as Physician Assistant (Internal Medicine) Bernell List, CPhT as [place] Management (Pharmacy Technician)  Extended Emergency Contact Information Primary Emergency Contact: [place], [place] [address] [name] of [place] Phone: [phone] Relation: Son Secondary Emergency Contact: [name], [place] [address] [name] of [place] Phone: [phone] Mobile Phone: [phone] Relation: Son  Code Status:  DNR Goals of care: Advanced Directive information Advanced Directives 03/06/2019  Does Patient Have a Medical Advance Directive? Yes  Type of Advance Directive Out of facility DNR (pink MOST or yellow form)  Does patient want to make changes to medical advance directive? No - Patient declined  Copy of [place] in Chart? -  Would patient like information on creating a medical advance directive? No - Patient declined  Pre-existing out of facility DNR order (yellow form or pink MOST form) Yellow form placed in chart (order not valid for inpatient use)     Chief Complaint  Patient presents with  . Acute Visit    Edema    HPI:  Pt is a 85 y.o. female seen today for an acute visit for worsening LE Edema  Patient has h/o Atrial fibrillation on Eliquis, S/P Pacemaker for sick sinus,S/P Acute CVA involving left MCA , ICA stenosis,Diastolic CHF,Dilated Pancreatic Duct, Hemorrhoids,hyperlipidemia,  Hypertension ,Arthritis with Chronic Knee and Back Pain,.Right retinal vein occlusion.And COPD  Patient was seen today as Nurses were concern about worsening Edema in her LE. Patient denies any SOB or cough or Chest pain . No Wheezing  She was on high  dose of Lasix but dose was reduced when she was sick with her COPD.Her weight seems to be stable   Past Medical History:  Diagnosis Date  . Atrial fibrillation ([place])   . Bradycardia   . Breast nodule 11/15/2014  . Breast pain, left 11/15/2014  . Carotid artery disease ([place])   . Dizziness   . Dyspnea    Previous CPX suggesting possible restrictive physiology, respiratory muscle fatigue, diastolic dysfunction  . Essential hypertension, benign   . Hyperlipidemia   . Oxygen dependent    2 liter  . Pacemaker [place]   . Rib pain on left side 11/15/2014  . Seasonal allergies   . Shingles 05/04/2015  . Stroke ([place])   . Toe fracture, left    left big toe   Past Surgical History:  Procedure Laterality Date  . COLONOSCOPY  02/22/2012   Procedure: COLONOSCOPY;  Surgeon: Rogene Houston, MD;  [facility];  Service: Endoscopy;  Laterality: N/A;  100  . KNEE SURGERY     bilateral  . PACEMAKER IMPLANT N/A 07/17/2017   Procedure: Pacemaker Implant;  Surgeon: Deboraha Sprang, MD;  Location: [facility] CV LAB;  Service: Cardiovascular;  Laterality: N/A;    Allergies  Allergen Reactions  . Sulfur Swelling and Rash    Outpatient Encounter Medications as of 03/06/2019  Medication Sig  . acetaminophen (TYLENOL) 500 MG tablet Take 1,000 mg by mouth 2 (two) times daily. Max of 3 grams acetaminophen per 24 hrs from all sources.  Marland Kitchen albuterol (PROVENTIL HFA;VENTOLIN HFA) 108 (90  Base) MCG/ACT inhaler Inhale 2 puffs into the lungs at bedtime. For SOB or wheezing  . amLODipine (NORVASC) 2.5 MG tablet Take 2.5 mg by mouth daily.  Jearl Klinefelter ELLIPTA 62.5-25 MCG/INH AEPB Inhale 1 puff into the lungs daily.   Marland Kitchen apixaban (ELIQUIS) 2.5 MG TABS tablet Take 1 tablet (2.5 mg total) by mouth 2 (two) times daily. Resume 07/20/17  . artificial tears (LACRILUBE) OINT ophthalmic ointment Place 1 application into both eyes at bedtime.  . cycloSPORINE (RESTASIS) 0.05 % ophthalmic emulsion Place 1 drop into both  eyes every 12 (twelve) hours.   . diclofenac sodium (VOLTAREN) 1 % GEL Apply 1 application topically 4 (four) times daily as needed. Add to body where needed  . diltiazem (CARDIZEM CD) 180 MG 24 hr capsule TAKE ONE CAPSULE BY MOUTH ONCE DAILY.  . furosemide (LASIX) 20 MG tablet Take 20 mg by mouth daily.  Marland Kitchen gabapentin (NEURONTIN) 100 MG capsule Take 100 mg by mouth 2 (two) times daily.  Marland Kitchen ipratropium-albuterol (DUONEB) 0.5-2.5 (3) MG/3ML SOLN Take 3 mLs by nebulization every 6 (six) hours as needed. Starting on 01/25/2019  For wheezing or SOB  . Melatonin 5 MG TABS Take 5 mg by mouth at bedtime as needed.  . Multiple Vitamin (MULITIVITAMIN WITH MINERALS) TABS Take 1 tablet by mouth every other day.   . NON FORMULARY Diet Type:  Regular  . OXYGEN Inhale 2 L into the lungs 2 (two) times daily. 3:15pm to 11:15pm 11:15pm to 07:15am  . pantoprazole (PROTONIX) 40 MG tablet Take 40 mg by mouth 2 (two) times daily.   Vladimir Faster Glycol-Propyl Glycol (SYSTANE OP) Place 1 drop into both eyes 3 (three) times daily. For dry eyes  . polyethylene glycol (MIRALAX / GLYCOLAX) packet Take 17 g by mouth daily as needed. For constipation  . traMADol (ULTRAM) 50 MG tablet Take 1 tablet (50 mg total) by mouth 3 (three) times daily for 30 days.  . [DISCONTINUED] Albuterol (VENTOLIN IN) Inhale into the lungs.    . [DISCONTINUED] hydrocortisone (ANUSOL-HC) 25 MG suppository Place 1 suppository (25 mg total) rectally daily. (Patient not taking: Reported on 03/06/2019)   No facility-administered encounter medications on file as of 03/06/2019.     Review of Systems  Constitutional: Negative.   HENT: Negative.   Respiratory: Negative for cough and shortness of breath.   Cardiovascular: Positive for leg swelling.  Gastrointestinal: Negative.   Genitourinary: Negative.   Musculoskeletal: Positive for arthralgias and back pain.  Neurological: Positive for weakness.  Psychiatric/Behavioral: Negative.     Immunization  History  Administered Date(s) Administered  . Influenza-Unspecified 10/10/2013, 09/07/2016, 09/11/2017, 09/11/2018  . Pneumococcal Conjugate-13 09/13/2017  . Pneumococcal-Unspecified 09/20/2016  . Tdap 09/16/2017  . Zoster 12/05/2017   Pertinent  Health Maintenance Due  Topic Date Due  . INFLUENZA VACCINE  Completed  . PNA vac Low Risk Adult  Completed  . DEXA SCAN  Discontinued   Fall Risk  09/11/2018 09/02/2017  Falls in the past year? No Yes  Number falls in past yr: - 1  Injury with Fall? - No   Functional Status Survey:    Vitals:   03/06/19 1301  BP: (!) 144/60  Pulse: 80  Resp: (!) 21  Temp: 97.6 F (36.4 C)  SpO2: 99%  Weight: 152 lb 12.8 oz (69.3 kg)  Height: _0  (1.626 m)   Body mass index is 26.23 kg/m. Physical Exam Constitutional:      Appearance: She is well-developed.  HENT:  Head: Normocephalic.  Eyes:     Pupils: Pupils are equal, round, and reactive to light.  Neck:     Musculoskeletal: Neck supple.  Cardiovascular:     Rate and Rhythm: Normal rate and regular rhythm.     Heart sounds: Murmur present.  Pulmonary:     Effort: Pulmonary effort is normal. No respiratory distress.     Breath sounds: No wheezing.  Abdominal:     General: Bowel sounds are normal. There is no distension.     Palpations: Abdomen is soft.     Tenderness: There is no abdominal tenderness. There is no guarding.  Musculoskeletal:     Comments: Bilateral Moderate Edema  Lymphadenopathy:     Cervical: No cervical adenopathy.  Skin:    General: Skin is warm and dry.  Neurological:     General: No focal deficit present.     Mental Status: She is alert and oriented to person, place, and time.     Comments: She does have Expressive aphasia  Psychiatric:        Behavior: Behavior normal.        Thought Content: Thought content normal.     Labs reviewed: Recent Labs    01/21/19 1030 01/26/19 0936 02/21/19 0900  NA 139 142 141  K 3.9 4.1 4.0  CL 106 107  107  CO2 _0 GLUCOSE 115* 83 90  BUN 30* 34* 20  CREATININE 1.08* 1.09* 0.91  CALCIUM 8.6* 8.7* 9.1   Recent Labs    08/08/18 0700 09/18/18 0700 10/28/18 0750  AST _1 ALT _2 ALKPHOS 130* 111 115  BILITOT 0.7 0.7 1.1  PROT 7.3 6.4* 7.0  ALBUMIN 3.8 3.4* 3.6   Recent Labs    06/18/18 0715 09/18/18 0700 01/21/19 1030 01/26/19 0936  WBC 6.0 5.7 11.6* 11.2*  NEUTROABS 3.3  --  8.6* 7.9*  HGB 12.2 12.9 12.8 13.3  HCT 38.1 41.7 41.0 43.2  MCV 88.0 88.2 88.6 89.1  PLT 200 185 186 307   Lab Results  Component Value Date   TSH 1.446 07/15/2017   Lab Results  Component Value Date   HGBA1C 5.5 12/06/2016   Lab Results  Component Value Date   CHOL 113 12/07/2017   HDL 56 12/07/2017   LDLCALC 48 12/07/2017   TRIG 47 12/07/2017   CHOLHDL 2.0 12/07/2017    Significant Diagnostic Results in last 30 days:  No results found.  Assessment/Plan Diastolic CHF with LE edema Will increase her Lasix to 40 mg Will continue to moniotr her weight and any other symptoms of CHF  Her other Issues are COPD Stable on Oxygen Prn and Anoro S/P Pacemaker insertion due to Sinus Node dysfunction She is stable Follows with cardiology No Change on Last Visit  Chronic atrial fibrillation  Rate is controlled on Diltiazem. Continueon lower dose ofEliquisas she has history of GI bleed  Internal carotid artery stenosis, bilateral Patient on Eliquis. Son has decided to not be aggressive.  S/P Cerebrovascular accident with mild Dysphasia Doing well on Eliquis. Aspirin was held due to her GI bleed. No change required. Continue on high dose of Statins Dilated pancreatic duct with elevated alk phos Patient follows with GI Ultrasound was negative. At this time no aggressive workup needed Hemorrhoids Patient has not had any more episodes of rectal bleed.or abdominal Pain Her Hgbhas beenstable . GI had decided not to do any banding procedure due to her  age and  Eliquis.  on Anusol Insomnia Melatonin as needed. Back and Joint pain Controlled on Ultram.TID And Tyelenol She is also on Neurontin. And on Voltaren gel   Family/ staff Communication:   Labs/tests ordered:  BMP Total time spent in this patient care encounter was 25_ minutes; greater than 50% of the visit spent counseling patient, reviewing records , Labs and coordinating care for problems addressed at this encounter.

## 2019-03-12 ENCOUNTER — Other Ambulatory Visit: Payer: Self-pay | Admitting: Internal Medicine

## 2019-03-12 MED ORDER — TRAMADOL HCL 50 MG PO TABS: 50.0000 mg | ORAL_TABLET | Freq: Three times a day (TID) | ORAL | 0 refills | Status: AC

## 2019-03-20 ENCOUNTER — Encounter (HOSPITAL_COMMUNITY)
Admission: RE | Admit: 2019-03-20 | Discharge: 2019-03-20 | Disposition: A | Discharge status: Home / Self Care | Payer: Medicare Other | Source: Skilled Nursing Facility | Attending: Internal Medicine | Admitting: Internal Medicine

## 2019-03-20 DIAGNOSIS — M25561 Pain in right knee: Secondary | ICD-10-CM | POA: Insufficient documentation

## 2019-03-20 DIAGNOSIS — J449 Chronic obstructive pulmonary disease, unspecified: Secondary | ICD-10-CM | POA: Diagnosis present

## 2019-03-20 DIAGNOSIS — I63312 Cerebral infarction due to thrombosis of left middle cerebral artery: Secondary | ICD-10-CM | POA: Diagnosis present

## 2019-03-20 DIAGNOSIS — M25562 Pain in left knee: Secondary | ICD-10-CM | POA: Diagnosis present

## 2019-03-20 DIAGNOSIS — G9009 Other idiopathic peripheral autonomic neuropathy: Secondary | ICD-10-CM | POA: Diagnosis present

## 2019-03-20 DIAGNOSIS — F5101 Primary insomnia: Secondary | ICD-10-CM | POA: Insufficient documentation

## 2019-03-20 LAB — BASIC METABOLIC PANEL
Anion gap: 8 (ref 5–15)
BUN: 17 mg/dL (ref 8–23)
CO2: 28 mmol/L (ref 22–32)
Calcium: 8.7 mg/dL — ABNORMAL LOW (ref 8.9–10.3)
Chloride: 106 mmol/L (ref 98–111)
Creatinine, Ser: 0.89 mg/dL (ref 0.44–1.00)
GFR calc Af Amer: >60 mL/min (ref >60)
GFR calc non Af Amer: 54 mL/min — ABNORMAL LOW (ref >60)
Glucose, Bld: 102 mg/dL — ABNORMAL HIGH (ref 70–99)
Potassium: 3.5 mmol/L (ref 3.5–5.1)
Sodium: 142 mmol/L (ref 135–145)

## 2019-04-02 ENCOUNTER — Encounter: Payer: Self-pay | Admitting: Internal Medicine

## 2019-04-02 ENCOUNTER — Non-Acute Institutional Stay (SKILLED_NURSING_FACILITY): Payer: Medicare Other | Admitting: Internal Medicine

## 2019-04-02 DIAGNOSIS — J449 Chronic obstructive pulmonary disease, unspecified: Secondary | ICD-10-CM | POA: Diagnosis not present

## 2019-04-02 DIAGNOSIS — I5032 Chronic diastolic (congestive) heart failure: Secondary | ICD-10-CM | POA: Diagnosis not present

## 2019-04-02 DIAGNOSIS — I4811 Longstanding persistent atrial fibrillation: Secondary | ICD-10-CM | POA: Diagnosis not present

## 2019-04-02 DIAGNOSIS — K219 Gastro-esophageal reflux disease without esophagitis: Secondary | ICD-10-CM

## 2019-04-02 NOTE — Progress Notes (Signed)
Location:  [place] Room Number: [place] of Service:  SNF [phone]) Provider:  Veleta Miners, MD  Virgie Dad, MD  Patient Care Team: Virgie Dad, MD as PCP - General (Internal Medicine) Rolm Baptise as Physician Assistant (Internal Medicine)  Extended Emergency Contact Information Primary Emergency Contact: Adin Hector, [place] [address] [name] of [place] Phone: [phone] Relation: Son Secondary Emergency Contact: [name], [place] [address] [name] of [place] Phone: [phone] Mobile Phone: [phone] Relation: Son  Code Status:  DNR Goals of care: Advanced Directive information Advanced Directives 04/02/2019  Does Patient Have a Medical Advance Directive? Yes  Type of Advance Directive Out of facility DNR (pink MOST or yellow form)  Does patient want to make changes to medical advance directive? No - Patient declined  Copy of [place] in Chart? -  Would patient like information on creating a medical advance directive? No - Patient declined  Pre-existing out of facility DNR order (yellow form or pink MOST form) Yellow form placed in chart (order not valid for inpatient use)     Chief Complaint  Patient presents with  . Medical Management of Chronic Issues    A-Fib - Hypertenion    HPI:  Pt is a 85 y.o. female seen today for medical management of chronic diseases.    Patient has h/o Atrial fibrillation on Eliquis, S/P Pacemaker for sick sinus,S/P Acute CVA involving left MCA , ICA stenosis,Diastolic CHF,Dilated Pancreatic Duct, Hemorrhoids,hyperlipidemia, Hypertension ,Arthritis with Chronic Knee and Back Pain,.Right retinal vein occlusion.And COPD  Patient is doing well in the facility.  I had gone up on her Lasix recently because of increased edema in her legs.  Since then she has lost almost 5 to 6 pounds.  She continues to have pain in her back and  neck.  This is her chronic problem. Otherwise she is doing well.  Her appetite is good.  Nurses have no acute issues. Past Medical History:  Diagnosis Date  . Atrial fibrillation ([place])   . Bradycardia   . Breast nodule 11/15/2014  . Breast pain, left 11/15/2014  . Carotid artery disease ([place])   . Dizziness   . Dyspnea    Previous CPX suggesting possible restrictive physiology, respiratory muscle fatigue, diastolic dysfunction  . Essential hypertension, benign   . Hyperlipidemia   . Oxygen dependent    2 liter  . Pacemaker [place]   . Rib pain on left side 11/15/2014  . Seasonal allergies   . Shingles 05/04/2015  . Stroke ([place])   . Toe fracture, left    left big toe   Past Surgical History:  Procedure Laterality Date  . COLONOSCOPY  02/22/2012   Procedure: COLONOSCOPY;  Surgeon: Rogene Houston, MD;  [facility];  Service: Endoscopy;  Laterality: N/A;  100  . KNEE SURGERY     bilateral  . PACEMAKER IMPLANT N/A 07/17/2017   Procedure: Pacemaker Implant;  Surgeon: Deboraha Sprang, MD;  Location: [place] [facility];  Service: Cardiovascular;  Laterality: N/A;    Allergies  Allergen Reactions  . Sulfur Swelling and Rash    Outpatient Encounter Medications as of 04/02/2019  Medication Sig  . acetaminophen (TYLENOL) 500 MG tablet Take 1,000 mg by mouth 2 (two) times daily. Max of 3 grams acetaminophen per 24 hrs from all sources.  Marland Kitchen  albuterol (PROVENTIL HFA;VENTOLIN HFA) 108 (90 Base) MCG/ACT inhaler Inhale 2 puffs into the lungs at bedtime. For SOB or wheezing  . amLODipine (NORVASC) 2.5 MG tablet Take 2.5 mg by mouth daily.  Jearl Klinefelter ELLIPTA 62.5-25 MCG/INH AEPB Inhale 1 puff into the lungs daily.   Marland Kitchen apixaban (ELIQUIS) 2.5 MG TABS tablet Take 1 tablet (2.5 mg total) by mouth 2 (two) times daily. Resume 07/20/17  . artificial tears (LACRILUBE) OINT ophthalmic ointment Place 1 application into both eyes at bedtime.  Marland Kitchen atorvastatin (LIPITOR) 80 MG tablet Take 80 mg by mouth  daily.  . cycloSPORINE (RESTASIS) 0.05 % ophthalmic emulsion Place 1 drop into both eyes every 12 (twelve) hours.   . diclofenac sodium (VOLTAREN) 1 % GEL Apply 1 application topically 4 (four) times daily as needed. Add to body where needed  . diltiazem (CARDIZEM CD) 180 MG 24 hr capsule TAKE ONE CAPSULE BY MOUTH ONCE DAILY.  . furosemide (LASIX) 40 MG tablet Take 40 mg by mouth daily.   Marland Kitchen gabapentin (NEURONTIN) 100 MG capsule Take 100 mg by mouth 2 (two) times daily.  Marland Kitchen ipratropium-albuterol (DUONEB) 0.5-2.5 (3) MG/3ML SOLN Take 3 mLs by nebulization every 6 (six) hours as needed. Starting on 01/25/2019  For wheezing or SOB  . Melatonin 5 MG TABS Take 5 mg by mouth at bedtime as needed.  . Multiple Vitamin (MULITIVITAMIN WITH MINERALS) TABS Take 1 tablet by mouth every other day.   . NON FORMULARY Diet Type:  Regular  . OXYGEN Inhale 2 L into the lungs 2 (two) times daily. 3:15pm to 11:15pm 11:15pm to 07:15am  . pantoprazole (PROTONIX) 40 MG tablet Take 40 mg by mouth 2 (two) times daily.   Vladimir Faster Glycol-Propyl Glycol (SYSTANE OP) Place 1 drop into both eyes 3 (three) times daily. For dry eyes  . polyethylene glycol (MIRALAX / GLYCOLAX) packet Take 17 g by mouth daily as needed. For constipation  . traMADol (ULTRAM) 50 MG tablet Take 1 tablet (50 mg total) by mouth 3 (three) times daily for 30 days.  . [DISCONTINUED] Albuterol (VENTOLIN IN) Inhale into the lungs.     No facility-administered encounter medications on file as of 04/02/2019.     Review of Systems  Constitutional: Negative.   HENT: Negative.   Respiratory: Negative for cough and shortness of breath.   Cardiovascular: Positive for leg swelling.  Gastrointestinal: Positive for abdominal pain.  Genitourinary: Negative.   Musculoskeletal: Positive for arthralgias, back pain, neck pain and neck stiffness.  Skin: Negative.   Neurological: Positive for weakness.  Psychiatric/Behavioral: Positive for sleep disturbance. The  patient is nervous/anxious.     Immunization History  Administered Date(s) Administered  . Influenza-Unspecified 10/10/2013, 09/07/2016, 09/11/2017, 09/11/2018  . Pneumococcal Conjugate-13 09/13/2017  . Pneumococcal-Unspecified 09/20/2016  . Tdap 09/16/2017  . Zoster 12/05/2017   Pertinent  Health Maintenance Due  Topic Date Due  . INFLUENZA VACCINE  07/11/2019  . PNA vac Low Risk Adult  Completed  . DEXA SCAN  Discontinued   Fall Risk  09/11/2018 09/02/2017  Falls in the past year? No Yes  Number falls in past yr: - 1  Injury with Fall? - No   Functional Status Survey:    Vitals:   04/02/19 0936  BP: 132/74  Pulse: 84  Resp: 20  Temp: (!) 97.5 F (36.4 C)  SpO2: 98%  Weight: 147 lb 3.2 oz (66.8 kg)  Height: _0  (1.626 m)   Body mass index is 25.27 kg/m. Physical Exam  Constitutional:      Appearance: She is well-developed.  HENT:     Head: Normocephalic.  Eyes:     Pupils: Pupils are equal, round, and reactive to light.  Neck:     Musculoskeletal: Neck supple.  Cardiovascular:     Rate and Rhythm: Normal rate and regular rhythm.     Heart sounds: Murmur present.  Pulmonary:     Effort: Pulmonary effort is normal. No respiratory distress.     Breath sounds: No wheezing.  Abdominal:     General: Bowel sounds are normal. There is no distension.     Palpations: Abdomen is soft.     Tenderness: There is no abdominal tenderness. There is no guarding.  Musculoskeletal:     Comments: Bilateral Mild Edema  Lymphadenopathy:     Cervical: No cervical adenopathy.  Skin:    General: Skin is warm and dry.  Neurological:     General: No focal deficit present.     Mental Status: She is alert and oriented to person, place, and time.     Comments: She does have Expressive aphasia  Psychiatric:        Behavior: Behavior normal.        Thought Content: Thought content normal.     Labs reviewed: Recent Labs    01/26/19 0936 02/21/19 0900 03/20/19 0702  NA 142  141 142  K 4.1 4.0 3.5  CL 107 107 106  CO2 _0 GLUCOSE 83 90 102*  BUN 34* 20 17  CREATININE 1.09* 0.91 0.89  CALCIUM 8.7* 9.1 8.7*   Recent Labs    08/08/18 0700 09/18/18 0700 10/28/18 0750  AST _1 ALT _2 ALKPHOS 130* 111 115  BILITOT 0.7 0.7 1.1  PROT 7.3 6.4* 7.0  ALBUMIN 3.8 3.4* 3.6   Recent Labs    06/18/18 0715 09/18/18 0700 01/21/19 1030 01/26/19 0936  WBC 6.0 5.7 11.6* 11.2*  NEUTROABS 3.3  --  8.6* 7.9*  HGB 12.2 12.9 12.8 13.3  HCT 38.1 41.7 41.0 43.2  MCV 88.0 88.2 88.6 89.1  PLT 200 185 186 307   Lab Results  Component Value Date   TSH 1.446 07/15/2017   Lab Results  Component Value Date   HGBA1C 5.5 12/06/2016   Lab Results  Component Value Date   CHOL 113 12/07/2017   HDL 56 12/07/2017   LDLCALC 48 12/07/2017   TRIG 47 12/07/2017   CHOLHDL 2.0 12/07/2017    Significant Diagnostic Results in last 30 days:  No results found.  Assessment/Plan Diastolic CHF with LE edema Stable on Lasix Has lost some weight but BUN was stable Repeat BMP in 2 weeks COPD Continue on Anoro S/P Pacemaker insertion due to Sinus Node dysfunction Stable  Follows with cardiology Chronic atrial fibrillation  On Cardizem and Lower dose of Eliquis due her h/o GI bleed  Internal carotid artery stenosis, bilateral Patient on Eliquis. Son has decided to not be aggressive.  S/P Cerebrovascular accident with mild Dysphasia Doing well on Eliquis. Aspirin was held due to her GI bleed. No change required. Continue on high dose of Statins Dilated pancreatic duct with elevated alk phos Patient follows with GI Ultrasound was negative. At this time no aggressive workup needed Hemorrhoids Patient has not had any more episodes of rectal bleed.or abdominal Pain Her Hgbhas beenstable . GI had decided not to do any banding procedure due to her age and Eliquis. on Anusol Insomnia Melatonin as  needed. Back and Joint pain Controlled on  Ultram.TID AndTyelenol She is also on Neurontin. And on Voltaren gel    Family/ staff Communication:   Labs/tests ordered:  BMP in 2 weeks Total time spent in this patient care encounter was  _25  minutes; greater than 50% of the visit spent counseling patient and staff, reviewing records , Labs and coordinating care for problems addressed at this encounter.

## 2019-04-08 ENCOUNTER — Non-Acute Institutional Stay (SKILLED_NURSING_FACILITY): Payer: Medicare Other | Admitting: Adult Health

## 2019-04-08 ENCOUNTER — Encounter: Payer: Self-pay | Admitting: Adult Health

## 2019-04-08 DIAGNOSIS — I5032 Chronic diastolic (congestive) heart failure: Secondary | ICD-10-CM | POA: Diagnosis not present

## 2019-04-08 DIAGNOSIS — I4811 Longstanding persistent atrial fibrillation: Secondary | ICD-10-CM | POA: Diagnosis not present

## 2019-04-08 DIAGNOSIS — I639 Cerebral infarction, unspecified: Secondary | ICD-10-CM

## 2019-04-08 NOTE — Progress Notes (Signed)
Location:   [place] Room Number: 152 D Place of Service:  SNF (31)   CODE STATUS: DNR  Allergies  Allergen Reactions  . Sulfur Swelling and Rash    Chief Complaint  Patient presents with  . Acute Visit    Care Plan Meeting    HPI:  We have come together for her routine care plan meeting. She has scored 15/15 BIMS. Her weight has been steady. She denies any uncontrolled pain; no changes in appetite; no insomnia no anxiety. There are no reports of fevers present.   Past Medical History:  Diagnosis Date  . Atrial fibrillation ([place])   . Bradycardia   . Breast nodule 11/15/2014  . Breast pain, left 11/15/2014  . Carotid artery disease ([place])   . Dizziness   . Dyspnea    Previous CPX suggesting possible restrictive physiology, respiratory muscle fatigue, diastolic dysfunction  . Essential hypertension, benign   . Hyperlipidemia   . Oxygen dependent    2 liter  . Pacemaker [place]   . Rib pain on left side 11/15/2014  . Seasonal allergies   . Shingles 05/04/2015  . Stroke ([place])   . Toe fracture, left    left big toe    Past Surgical History:  Procedure Laterality Date  . COLONOSCOPY  02/22/2012   Procedure: COLONOSCOPY;  Surgeon: Rogene Houston, MD;  [facility];  Service: Endoscopy;  Laterality: N/A;  100  . KNEE SURGERY     bilateral  . PACEMAKER IMPLANT N/A 07/17/2017   Procedure: Pacemaker Implant;  Surgeon: Deboraha Sprang, MD;  Location: [facility];  Service: Cardiovascular;  Laterality: N/A;    Social History   Socioeconomic History  . Marital status: Widowed    Spouse name: Not on file  . Number of children: Not on file  . Years of education: Not on file  . Highest education level: Not on file  Occupational History  . Not on file  Social Needs  . Financial resource strain: Not hard at all  . Food insecurity:    Worry: Never true    Inability: Never true  . Transportation needs:    Medical: No   Non-medical: No  Tobacco Use  . Smoking status: Never Smoker  . Smokeless tobacco: Never Used  Substance and Sexual Activity  . Alcohol use: No  . Drug use: No  . Sexual activity: Never    Birth control/protection: Post-menopausal  Lifestyle  . Physical activity:    Days per week: 0 days    Minutes per session: 0 min  . Stress: Not at all  Relationships  . Social connections:    Talks on phone: Three times a week    Gets together: Twice a week    Attends religious service: Never    Active member of club or organization: No    Attends meetings of clubs or organizations: Never    Relationship status: Widowed  . Intimate partner violence:    Fear of current or ex partner: No    Emotionally abused: No    Physically abused: No    Forced sexual activity: No  Other Topics Concern  . Not on file  Social History Narrative  . Not on file   Family History  Problem Relation Age of Onset  . Stroke Mother   . Pneumonia Father   . Heart disease Father   . Healthy Son   . Glaucoma Daughter   .  Emphysema Daughter   . Healthy Daughter   . Other Daughter        had knee replacement  . Healthy Son   . Heart disease Maternal Grandmother       VITAL SIGNS BP (!) 161/66   Pulse 60   Temp 97.8 F (36.6 C)   Resp 18   Ht 5\' 4"  (1.626 m)   Wt 143 lb 6.4 oz (65 kg)   SpO2 99%   BMI 24.61 kg/m   Outpatient Encounter Medications as of 04/08/2019  Medication Sig  . acetaminophen (TYLENOL) 500 MG tablet Take 1,000 mg by mouth 2 (two) times daily. Max of 3 grams acetaminophen per 24 hrs from all sources.  Marland Kitchen albuterol (PROVENTIL HFA;VENTOLIN HFA) 108 (90 Base) MCG/ACT inhaler Inhale 2 puffs into the lungs at bedtime. For SOB or wheezing  . amLODipine (NORVASC) 2.5 MG tablet Take 2.5 mg by mouth daily.  Jearl Klinefelter ELLIPTA 62.5-25 MCG/INH AEPB Inhale 1 puff into the lungs daily.   Marland Kitchen apixaban (ELIQUIS) 2.5 MG TABS tablet Take 1 tablet (2.5 mg total) by mouth 2 (two) times daily. Resume  07/20/17  . artificial tears (LACRILUBE) OINT ophthalmic ointment Place 1 application into both eyes at bedtime.  Marland Kitchen atorvastatin (LIPITOR) 80 MG tablet Take 80 mg by mouth daily.  . cycloSPORINE (RESTASIS) 0.05 % ophthalmic emulsion Place 1 drop into both eyes every 12 (twelve) hours.   . diclofenac sodium (VOLTAREN) 1 % GEL Apply 1 application topically 4 (four) times daily as needed. Add to body where needed  . diltiazem (CARDIZEM CD) 180 MG 24 hr capsule TAKE ONE CAPSULE BY MOUTH ONCE DAILY.  . furosemide (LASIX) 40 MG tablet Take 40 mg by mouth daily.   Marland Kitchen gabapentin (NEURONTIN) 100 MG capsule Take 100 mg by mouth 2 (two) times daily.  Marland Kitchen ipratropium-albuterol (DUONEB) 0.5-2.5 (3) MG/3ML SOLN Take 3 mLs by nebulization every 6 (six) hours as needed. Starting on 01/25/2019  For wheezing or SOB  . Melatonin 5 MG TABS Take 5 mg by mouth at bedtime as needed.  . Multiple Vitamin (MULITIVITAMIN WITH MINERALS) TABS Take 1 tablet by mouth every other day.   . NON FORMULARY Diet Type:  Regular  . OXYGEN Inhale 2 L into the lungs 2 (two) times daily. 3:15pm to 11:15pm 11:15pm to 07:15am  . pantoprazole (PROTONIX) 40 MG tablet Take 40 mg by mouth 2 (two) times daily.   Vladimir Faster Glycol-Propyl Glycol (SYSTANE OP) Place 1 drop into both eyes 3 (three) times daily. For dry eyes  . polyethylene glycol (MIRALAX / GLYCOLAX) packet Take 17 g by mouth daily as needed. For constipation  . traMADol (ULTRAM) 50 MG tablet Take 1 tablet (50 mg total) by mouth 3 (three) times daily for 30 days.  Marland Kitchen triamcinolone cream (KENALOG) 0.1 % Apply 1 application topically 2 (two) times daily. Apply to rash on chin  . UNABLE TO FIND Microguard Powder - Apply to groin area for fungal erythema / rash to groin two times daily x 14 days  . [DISCONTINUED] Albuterol (VENTOLIN IN) Inhale into the lungs.     No facility-administered encounter medications on file as of 04/08/2019.      SIGNIFICANT DIAGNOSTIC EXAMS  LABS REVIEWED:  PREVIOUS   12-07-17: chol 113; ldl 48  trig 47 hdl 56  01-26-19: wbc 11.2; hgb  13.3; hct 43.2; mcv 89.1 plt 307; glucose 83; bun 34; creat 1.00; k+ 4.1; na++ 142; ca 8.7  02-21-19: glucose 83; bun  34; creat 1.09 k+ 4.0; na++ 141 ca 8.7  LABS REVIEWED: TODAY  03-20-19: glucose 102; bun 17; creat 0.89;  k+ 3.5; na++142 ca 8.7   Review of Systems  Constitutional: Negative for malaise/fatigue.  Respiratory: Negative for cough and shortness of breath.   Cardiovascular: Negative for chest pain, palpitations and leg swelling.  Gastrointestinal: Negative for abdominal pain, constipation and heartburn.  Musculoskeletal: Negative for back pain, joint pain and myalgias.  Skin: Negative.   Neurological: Negative for dizziness.  Psychiatric/Behavioral: The patient is not nervous/anxious.     Physical Exam Constitutional:      General: She is not in acute distress.    Appearance: She is well-developed. She is not diaphoretic.  Neck:     Musculoskeletal: Neck supple.     Thyroid: No thyromegaly.  Cardiovascular:     Rate and Rhythm: Normal rate and regular rhythm.     Pulses: Normal pulses.     Heart sounds: Normal heart sounds.     Comments: Pace maker  Pulmonary:     Effort: Pulmonary effort is normal. No respiratory distress.     Breath sounds: Normal breath sounds.     Comments: 02 dependent  Abdominal:     General: Bowel sounds are normal. There is no distension.     Palpations: Abdomen is soft.     Tenderness: There is no abdominal tenderness.  Musculoskeletal:     Right lower leg: Edema present.     Left lower leg: Edema present.     Comments:  Is able to move all extremities Has 1+ bilateral lower extremity edema  Wears ted hose    Lymphadenopathy:     Cervical: No cervical adenopathy.  Skin:    General: Skin is warm and dry.  Neurological:     Mental Status: She is alert and oriented to person, place, and time.  Psychiatric:        Mood and Affect: Mood normal.        ASSESSMENT/ PLAN:  TODAY;   1. Chronic diastolic heart failure 2. Long standing persistent atrial fibrillation 3. CVA (cerebral vascular accident)   Will continue her current plan of care Will continue her current medications Will continue to monitor her status   MD is aware of resident's narcotic use and is in agreement with current plan of care. We will attempt to wean resident as apropriate   Ok Edwards NP [facility]  Contact [phone] Monday through Friday 8am- 5pm  After hours call [phone]

## 2019-04-09 ENCOUNTER — Encounter (HOSPITAL_COMMUNITY)
Admission: RE | Admit: 2019-04-09 | Discharge: 2019-04-09 | Disposition: A | Discharge status: Home / Self Care | Payer: Medicare Other | Source: Skilled Nursing Facility | Attending: Internal Medicine | Admitting: Internal Medicine

## 2019-04-09 DIAGNOSIS — M25561 Pain in right knee: Secondary | ICD-10-CM | POA: Insufficient documentation

## 2019-04-09 DIAGNOSIS — I110 Hypertensive heart disease with heart failure: Secondary | ICD-10-CM | POA: Insufficient documentation

## 2019-04-09 DIAGNOSIS — I63312 Cerebral infarction due to thrombosis of left middle cerebral artery: Secondary | ICD-10-CM | POA: Diagnosis present

## 2019-04-09 DIAGNOSIS — J449 Chronic obstructive pulmonary disease, unspecified: Secondary | ICD-10-CM | POA: Insufficient documentation

## 2019-04-09 DIAGNOSIS — M25562 Pain in left knee: Secondary | ICD-10-CM | POA: Diagnosis present

## 2019-04-09 DIAGNOSIS — G9009 Other idiopathic peripheral autonomic neuropathy: Secondary | ICD-10-CM | POA: Insufficient documentation

## 2019-04-09 DIAGNOSIS — F5101 Primary insomnia: Secondary | ICD-10-CM | POA: Insufficient documentation

## 2019-04-09 LAB — BASIC METABOLIC PANEL
Anion gap: 9 (ref 5–15)
BUN: 26 mg/dL — ABNORMAL HIGH (ref 8–23)
CO2: 26 mmol/L (ref 22–32)
Calcium: 8.5 mg/dL — ABNORMAL LOW (ref 8.9–10.3)
Chloride: 108 mmol/L (ref 98–111)
Creatinine, Ser: 0.95 mg/dL (ref 0.44–1.00)
GFR calc Af Amer: 58 mL/min — ABNORMAL LOW (ref >60)
GFR calc non Af Amer: 50 mL/min — ABNORMAL LOW (ref >60)
Glucose, Bld: 94 mg/dL (ref 70–99)
Potassium: 3.6 mmol/L (ref 3.5–5.1)
Sodium: 143 mmol/L (ref 135–145)

## 2019-04-21 ENCOUNTER — Ambulatory Visit (INDEPENDENT_AMBULATORY_CARE_PROVIDER_SITE_OTHER): Payer: Medicare Other | Admitting: *Deleted

## 2019-04-21 DIAGNOSIS — I442 Atrioventricular block, complete: Secondary | ICD-10-CM

## 2019-04-21 DIAGNOSIS — I482 Chronic atrial fibrillation, unspecified: Secondary | ICD-10-CM

## 2019-04-22 LAB — CUP PACEART REMOTE DEVICE CHECK
Date Time Interrogation Session: 20200513131345
Implantable Lead Implant Date: 20180808
Implantable Lead Location: 753860
Implantable Lead Model: 1948
Implantable Pulse Generator Implant Date: 20180808
Pulse Gen Model: 1272
Pulse Gen Serial Number: 7957820

## 2019-04-30 ENCOUNTER — Non-Acute Institutional Stay (SKILLED_NURSING_FACILITY): Payer: Medicare Other | Admitting: Internal Medicine

## 2019-04-30 ENCOUNTER — Encounter: Payer: Self-pay | Admitting: Internal Medicine

## 2019-04-30 ENCOUNTER — Other Ambulatory Visit (HOSPITAL_COMMUNITY)
Admission: AD | Admit: 2019-04-30 | Discharge: 2019-04-30 | Disposition: A | Discharge status: Home / Self Care | Payer: Medicare Other | Source: Skilled Nursing Facility | Attending: Internal Medicine | Admitting: Internal Medicine

## 2019-04-30 DIAGNOSIS — R8279 Other abnormal findings on microbiological examination of urine: Secondary | ICD-10-CM | POA: Insufficient documentation

## 2019-04-30 DIAGNOSIS — I5032 Chronic diastolic (congestive) heart failure: Secondary | ICD-10-CM | POA: Diagnosis not present

## 2019-04-30 DIAGNOSIS — I4811 Longstanding persistent atrial fibrillation: Secondary | ICD-10-CM

## 2019-04-30 DIAGNOSIS — R112 Nausea with vomiting, unspecified: Secondary | ICD-10-CM | POA: Diagnosis not present

## 2019-04-30 DIAGNOSIS — I110 Hypertensive heart disease with heart failure: Secondary | ICD-10-CM | POA: Insufficient documentation

## 2019-04-30 LAB — CBC WITH DIFFERENTIAL/PLATELET
Abs Immature Granulocytes: 0.02 10*3/uL (ref 0.00–0.07)
Basophils Absolute: 0 10*3/uL (ref 0.0–0.1)
Basophils Relative: 0 %
Eosinophils Absolute: 0 10*3/uL (ref 0.0–0.5)
Eosinophils Relative: 0 %
HCT: 44.2 % (ref 36.0–46.0)
Hemoglobin: 14.2 g/dL (ref 12.0–15.0)
Immature Granulocytes: 0 %
Lymphocytes Relative: 12 %
Lymphs Abs: 1 10*3/uL (ref 0.7–4.0)
MCH: 28 pg (ref 26.0–34.0)
MCHC: 32.1 g/dL (ref 30.0–36.0)
MCV: 87 fL (ref 80.0–100.0)
Monocytes Absolute: 0.7 10*3/uL (ref 0.1–1.0)
Monocytes Relative: 8 %
Neutro Abs: 7 10*3/uL (ref 1.7–7.7)
Neutrophils Relative %: 80 %
Platelets: 205 10*3/uL (ref 150–400)
RBC: 5.08 MIL/uL (ref 3.87–5.11)
RDW: 14.5 % (ref 11.5–15.5)
WBC: 8.8 10*3/uL (ref 4.0–10.5)
nRBC: 0.3 % — ABNORMAL HIGH (ref 0.0–0.2)

## 2019-04-30 LAB — HEPATIC FUNCTION PANEL
ALT: 21 U/L (ref 0–44)
AST: 33 U/L (ref 15–41)
Albumin: 4.2 g/dL (ref 3.5–5.0)
Alkaline Phosphatase: 108 U/L (ref 38–126)
Bilirubin, Direct: 0.3 mg/dL — ABNORMAL HIGH (ref 0.0–0.2)
Indirect Bilirubin: 0.9 mg/dL (ref 0.3–0.9)
Total Bilirubin: 1.2 mg/dL (ref 0.3–1.2)
Total Protein: 7 g/dL (ref 6.5–8.1)

## 2019-04-30 LAB — URINALYSIS, COMPLETE (UACMP) WITH MICROSCOPIC
Bilirubin Urine: NEGATIVE
Glucose, UA: NEGATIVE mg/dL
Hgb urine dipstick: NEGATIVE
Ketones, ur: NEGATIVE mg/dL
Nitrite: NEGATIVE
Protein, ur: NEGATIVE mg/dL
Specific Gravity, Urine: 1.018 (ref 1.005–1.030)
pH: 5 (ref 5.0–8.0)

## 2019-04-30 LAB — AMYLASE: Amylase: 47 U/L (ref 28–100)

## 2019-04-30 LAB — BASIC METABOLIC PANEL
Anion gap: 16 — ABNORMAL HIGH (ref 5–15)
BUN: 27 mg/dL — ABNORMAL HIGH (ref 8–23)
CO2: 23 mmol/L (ref 22–32)
Calcium: 9 mg/dL (ref 8.9–10.3)
Chloride: 101 mmol/L (ref 98–111)
Creatinine, Ser: 1.08 mg/dL — ABNORMAL HIGH (ref 0.44–1.00)
GFR calc Af Amer: 49 mL/min — ABNORMAL LOW (ref >60)
GFR calc non Af Amer: 43 mL/min — ABNORMAL LOW (ref >60)
Glucose, Bld: 93 mg/dL (ref 70–99)
Potassium: 3.5 mmol/L (ref 3.5–5.1)
Sodium: 140 mmol/L (ref 135–145)

## 2019-04-30 NOTE — Progress Notes (Signed)
Location:    [facility] Room Number: 152/D Place of Service:  SNF (31) Provider:    MD  ,  L, MD  Patient Care Team: ,  L, MD as PCP - General (Internal Medicine) Lassen, Arlo C, PA-C as Physician Assistant (Internal Medicine)  Extended Emergency Contact Information Primary Emergency Contact: Cashman,Claude          [place], [place] [address] [place] Home Phone: [phone] Relation: Son Secondary Emergency Contact: Oleksy,John          [place], [place] [address] [place] Home Phone: [phone] Mobile Phone: [phone] Relation: Son  Code Status:  DNR Goals of care: Advanced Directive information Advanced Directives 04/30/2019  Does Patient Have a Medical Advance Directive? Yes  Type of Advance Directive Out of facility DNR (pink MOST or yellow form)  Does patient want to make changes to medical advance directive? No - Patient declined  Copy of Healthcare Power of Attorney in Chart? No - copy requested  Would patient like information on creating a medical advance directive? No - Patient declined  Pre-existing out of facility DNR order (yellow form or pink MOST form) -     Chief Complaint  Patient presents with  . Acute Visit    Vomiting    HPI:  Pt is a 85 y.o. female seen today for an acute visit for Nausea and Vomiting  Patient has h/o Atrial fibrillation on Eliquis, S/P Pacemaker for sick sinus,S/P Acute CVA involving left MCA , ICA stenosis,Diastolic CHF,Dilated Pancreatic Duct, Hemorrhoids,hyperlipidemia, Hypertension ,Arthritis with Chronic Knee and Back Pain,.Right retinal vein occlusion.And COPD  Patient was seen today as she had episode of Vomiting yesterday morning. She has not been eating well since then. Also Had lower abdominal Pain. But today she is much better. Was able to eat her breakfast She denies any Dysuria/Fever/ Cough or SOB. Mild epigastric pain. Moved her  bowels yesterday. .   Past Medical History:  Diagnosis Date  . Atrial fibrillation (HCC)   . Bradycardia   . Breast nodule 11/15/2014  . Breast pain, left 11/15/2014  . Carotid artery disease (HCC)   . Dizziness   . Dyspnea    Previous CPX suggesting possible restrictive physiology, respiratory muscle fatigue, diastolic dysfunction  . Essential hypertension, benign   . Hyperlipidemia   . Oxygen dependent    2 liter  . Pacemaker [employer]   . Rib pain on left side 11/15/2014  . Seasonal allergies   . Shingles 05/04/2015  . Stroke (HCC)   . Toe fracture, left    left big toe   Past Surgical History:  Procedure Laterality Date  . COLONOSCOPY  02/22/2012   Procedure: COLONOSCOPY;  Surgeon: Najeeb U Rehman, MD;  [facility]: [facility];  Service: Endoscopy;  Laterality: N/A;  100  . KNEE SURGERY     bilateral  . PACEMAKER IMPLANT N/A 07/17/2017   Procedure: Pacemaker Implant;  Surgeon: Klein, Steven C, MD;  Location: [facility] INV[facility] LAB;  Service: Cardiovascular;  Laterality: N/A;    Allergies  Allergen Reactions  . Sulfur Swelling and Rash    Outpatient Encounter Medications as of 04/30/2019  Medication Sig  . acetaminophen (TYLENOL) 500 MG tablet Take 1,000 mg by mouth 2 (two) times daily. Max of 3 grams acetaminophen per 24 hrs from all sources.  . albuterol (PROVENTIL HFA;VENTOLIN HFA) 108 (90 Base) MCG/ACT inhaler Inhale 2 puffs into the lungs at bedtime. For SOB or wheezing  .   amLODipine (NORVASC) 2.5 MG tablet Take 2.5 mg by mouth daily.  Jearl Klinefelter ELLIPTA 62.5-25 MCG/INH AEPB Inhale 1 puff into the lungs daily.   Marland Kitchen apixaban (ELIQUIS) 2.5 MG TABS tablet Take 1 tablet (2.5 mg total) by mouth 2 (two) times daily. Resume 07/20/17  . artificial tears (LACRILUBE) OINT ophthalmic ointment Place 1 application into both eyes at bedtime.  Marland Kitchen atorvastatin (LIPITOR) 80 MG tablet Take 80 mg by mouth daily.  . cycloSPORINE (RESTASIS) 0.05 % ophthalmic emulsion Place 1 drop into both eyes  every 12 (twelve) hours.   . diclofenac sodium (VOLTAREN) 1 % GEL Apply 1 application topically 4 (four) times daily as needed. Add to body where needed  . diltiazem (CARDIZEM CD) 180 MG 24 hr capsule TAKE ONE CAPSULE BY MOUTH ONCE DAILY.  . furosemide (LASIX) 40 MG tablet Take 40 mg by mouth daily.   Marland Kitchen gabapentin (NEURONTIN) 100 MG capsule Take 100 mg by mouth 2 (two) times daily.  Marland Kitchen ipratropium-albuterol (DUONEB) 0.5-2.5 (3) MG/3ML SOLN Take 3 mLs by nebulization every 6 (six) hours as needed. Starting on 01/25/2019  For wheezing or SOB  . Melatonin 5 MG TABS Take 5 mg by mouth at bedtime as needed.  . Multiple Vitamin (MULITIVITAMIN WITH MINERALS) TABS Take 1 tablet by mouth every other day.   . NON FORMULARY Diet Type:  Regular  . OXYGEN Inhale 2 L into the lungs 2 (two) times daily. 3:15pm to 11:15pm 11:15pm to 07:15am  . pantoprazole (PROTONIX) 40 MG tablet Take 40 mg by mouth 2 (two) times daily.   Vladimir Faster Glycol-Propyl Glycol (SYSTANE OP) Place 1 drop into both eyes 3 (three) times daily. For dry eyes  . polyethylene glycol (MIRALAX / GLYCOLAX) packet Take 17 g by mouth daily as needed. For constipation  . traMADol (ULTRAM) 50 MG tablet Take 50 mg by mouth 3 (three) times daily.  . [DISCONTINUED] Albuterol (VENTOLIN IN) Inhale into the lungs.     No facility-administered encounter medications on file as of 04/30/2019.      Review of Systems  Constitutional: Positive for appetite change.  HENT: Negative.   Respiratory: Negative.   Cardiovascular: Positive for leg swelling.  Gastrointestinal: Positive for abdominal pain and nausea.  Genitourinary: Negative.   Musculoskeletal: Positive for back pain.  Skin: Negative.   Neurological: Positive for weakness.  Psychiatric/Behavioral: Negative.     Immunization History  Administered Date(s) Administered  . Influenza-Unspecified 10/10/2013, 09/07/2016, 09/11/2017, 09/11/2018  . Pneumococcal Conjugate-13 09/13/2017  .  Pneumococcal-Unspecified 09/20/2016  . Tdap 09/16/2017  . Zoster 12/05/2017   Pertinent  Health Maintenance Due  Topic Date Due  . INFLUENZA VACCINE  07/11/2019  . PNA vac Low Risk Adult  Completed  . DEXA SCAN  Discontinued   Fall Risk  09/11/2018 09/02/2017  Falls in the past year? No Yes  Number falls in past yr: - 1  Injury with Fall? - No   Functional Status Survey:    Vitals:   05/01/19 0933  BP: 121/65  Pulse: 60  Resp: 18  Temp: 98.2 F (36.8 C)   There is no height or weight on file to calculate BMI. Physical Exam Vitals signs reviewed.  Constitutional:      Appearance: Normal appearance.  HENT:     Head: Normocephalic.     Mouth/Throat:     Mouth: Mucous membranes are moist.     Pharynx: Oropharynx is clear.  Eyes:     Pupils: Pupils are equal, round, and reactive  to light.  Neck:     Musculoskeletal: Neck supple.  Cardiovascular:     Rate and Rhythm: Normal rate and regular rhythm.     Pulses: Normal pulses.     Heart sounds: Normal heart sounds.  Pulmonary:     Effort: Pulmonary effort is normal. No respiratory distress.     Breath sounds: Normal breath sounds. No wheezing or rales.  Abdominal:     General: Abdomen is flat. Bowel sounds are normal.     Palpations: Abdomen is soft.     Comments: Her Abdomen was Soft with no Tenderness. BS present  Skin:    General: Skin is warm and dry.  Neurological:     General: No focal deficit present.     Mental Status: She is alert.  Psychiatric:        Mood and Affect: Mood normal.        Thought Content: Thought content normal.     Labs reviewed: Recent Labs    03/20/19 0702 04/09/19 0722 04/30/19 1300  NA 142 143 140  K 3.5 3.6 3.5  CL 106 108 101  CO2 _0 GLUCOSE 102* 94 93  BUN 17 26* 27*  CREATININE 0.89 0.95 1.08*  CALCIUM 8.7* 8.5* 9.0   Recent Labs    09/18/18 0700 10/28/18 0750 04/30/19 1300  AST 21 23 33  ALT _1 ALKPHOS 111 115 108  BILITOT 0.7 1.1 1.2  PROT  6.4* 7.0 7.0  ALBUMIN 3.4* 3.6 4.2   Recent Labs    01/21/19 1030 01/26/19 0936 04/30/19 1300  WBC 11.6* 11.2* 8.8  NEUTROABS 8.6* 7.9* 7.0  HGB 12.8 13.3 14.2  HCT 41.0 43.2 44.2  MCV 88.6 89.1 87.0  PLT 186 307 205   Lab Results  Component Value Date   TSH 1.446 07/15/2017   Lab Results  Component Value Date   HGBA1C 5.5 12/06/2016   Lab Results  Component Value Date   CHOL 113 12/07/2017   HDL 56 12/07/2017   LDLCALC 48 12/07/2017   TRIG 47 12/07/2017   CHOLHDL 2.0 12/07/2017    Significant Diagnostic Results in last 30 days:  No results found.  Assessment/Plan Nausea ,Vomintng and Abdominal Pain Will do Stat CMP,Amylase and CBC Also [place] UA  Addendum Her Labs came back Normal. She says her pain is better  Already on Protonix If it recurs will think of Discontinuing her Ultram  Other issues Diastolic CHF with LE edema Stable on Lasix BMP stable COPD Continue on Anoro S/P Pacemaker insertion due to Sinus Node dysfunction Stable  Follows with cardiology Chronic atrial fibrillation  On Cardizem and Lower dose of Eliquis due her h/o GI bleed  Internal carotid artery stenosis, bilateral Patient on Eliquis. Son has decided to not be aggressive.  S/P Cerebrovascular accident with mild Dysphasia Doing well on Eliquis. Aspirin was held due to her GI bleed. No change required. Continueon high dose of Statins Dilated pancreatic duct with elevated alk phos Patient follows with GI Ultrasound was negative. At this time no aggressive workup needed Hemorrhoids Patient has not had any more episodes of rectal bleed. Her Hgbhas beenstable . GI had decided not to do any banding procedure due to her age and Eliquis. on Anusol Insomnia Melatonin as needed. Back and Joint pain Controlled on Ultram.TID AndTyelenol She is also on Neurontin. And on Voltaren gel    Family/ staff Communication:   Labs/tests ordered:   Total time spent in  this patient care encounter was  25_  minutes; greater than 50% of the visit spent counseling patient and staff, reviewing records , Labs and coordinating care for problems addressed at this encounter.  

## 2019-05-01 ENCOUNTER — Encounter: Payer: Self-pay | Admitting: Internal Medicine

## 2019-05-03 LAB — URINE CULTURE: Culture: >=100000 — AB

## 2019-05-05 ENCOUNTER — Other Ambulatory Visit: Payer: Self-pay | Admitting: Adult Health

## 2019-05-05 ENCOUNTER — Encounter: Payer: Self-pay | Admitting: Adult Health

## 2019-05-05 ENCOUNTER — Non-Acute Institutional Stay (SKILLED_NURSING_FACILITY): Payer: Medicare Other | Admitting: Adult Health

## 2019-05-05 DIAGNOSIS — I639 Cerebral infarction, unspecified: Secondary | ICD-10-CM | POA: Diagnosis not present

## 2019-05-05 DIAGNOSIS — I5032 Chronic diastolic (congestive) heart failure: Secondary | ICD-10-CM | POA: Diagnosis not present

## 2019-05-05 DIAGNOSIS — N390 Urinary tract infection, site not specified: Secondary | ICD-10-CM | POA: Diagnosis not present

## 2019-05-05 DIAGNOSIS — I442 Atrioventricular block, complete: Secondary | ICD-10-CM | POA: Diagnosis not present

## 2019-05-05 DIAGNOSIS — R634 Abnormal weight loss: Secondary | ICD-10-CM | POA: Diagnosis not present

## 2019-05-05 DIAGNOSIS — B9620 Unspecified Escherichia coli [E. coli] as the cause of diseases classified elsewhere: Secondary | ICD-10-CM

## 2019-05-05 MED ORDER — TRAMADOL HCL 50 MG PO TABS: 50.0000 mg | ORAL_TABLET | Freq: Three times a day (TID) | ORAL | 0 refills | Status: DC

## 2019-05-05 NOTE — Progress Notes (Signed)
Location:   [place] Room Number: 152 D Place of Service:  SNF (31)   CODE STATUS: DNR  Allergies  Allergen Reactions  . Sulfur Swelling and Rash    Chief Complaint  Patient presents with  . Medical Management of Chronic Issues    Chronic diastolic heart failure; complete heart block; cerebrovascular accident (CVA) unspecified mechanism.     HPI:  She is a 85 year old long term resident of this facility being seen for the management of her chronic illnesses: chf; heart block cva. She is being treated for an uti. She has lost 10 pounds since March of this year. There are no reports of changes in appetite. She denies any uncontrolled pain. There are no reports of anxiety of present. There are no reports of fevers present.   Past Medical History:  Diagnosis Date  . Atrial fibrillation ([place])   . Bradycardia   . Breast nodule 11/15/2014  . Breast pain, left 11/15/2014  . Carotid artery disease ([place])   . Dizziness   . Dyspnea    Previous CPX suggesting possible restrictive physiology, respiratory muscle fatigue, diastolic dysfunction  . Essential hypertension, benign   . Hyperlipidemia   . Oxygen dependent    2 liter  . Pacemaker [employer]   . Rib pain on left side 11/15/2014  . Seasonal allergies   . Shingles 05/04/2015  . Stroke ([place])   . Toe fracture, left    left big toe    Past Surgical History:  Procedure Laterality Date  . COLONOSCOPY  02/22/2012   Procedure: COLONOSCOPY;  Surgeon: Rogene Houston, MD;  Location: [facility];  Service: Endoscopy;  Laterality: N/A;  100  . KNEE SURGERY     bilateral  . PACEMAKER IMPLANT N/A 07/17/2017   Procedure: Pacemaker Implant;  Surgeon: Deboraha Sprang, MD;  Location: [place] CV LAB;  Service: Cardiovascular;  Laterality: N/A;    Social History   Socioeconomic History  . Marital status: Widowed    Spouse name: Not on file  . Number of children: Not on file  . Years of education: Not on  file  . Highest education level: Not on file  Occupational History  . Not on file  Social Needs  . Financial resource strain: Not hard at all  . Food insecurity:    Worry: Never true    Inability: Never true  . Transportation needs:    Medical: No    Non-medical: No  Tobacco Use  . Smoking status: Never Smoker  . Smokeless tobacco: Never Used  Substance and Sexual Activity  . Alcohol use: No  . Drug use: No  . Sexual activity: Never    Birth control/protection: Post-menopausal  Lifestyle  . Physical activity:    Days per week: 0 days    Minutes per session: 0 min  . Stress: Not at all  Relationships  . Social connections:    Talks on phone: Three times a week    Gets together: Twice a week    Attends religious service: Never    Active member of club or organization: No    Attends meetings of clubs or organizations: Never    Relationship status: Widowed  . Intimate partner violence:    Fear of current or ex partner: No    Emotionally abused: No    Physically abused: No    Forced sexual activity: No  Other Topics Concern  . Not on file  Social History Narrative  . Not on file   Family History  Problem Relation Age of Onset  . Stroke Mother   . Pneumonia Father   . Heart disease Father   . Healthy Son   . Glaucoma Daughter   . Emphysema Daughter   . Healthy Daughter   . Other Daughter        had knee replacement  . Healthy Son   . Heart disease Maternal Grandmother       VITAL SIGNS BP 129/84   Pulse 84   Temp 98.2 F (36.8 C)   Resp 20   Ht 5\' 4"  (1.626 m)   Wt 140 lb (63.5 kg)   SpO2 99%   BMI 24.03 kg/m   Outpatient Encounter Medications as of 05/05/2019  Medication Sig  . acetaminophen (TYLENOL) 500 MG tablet Take 1,000 mg by mouth 2 (two) times daily. Max of 3 grams acetaminophen per 24 hrs from all sources.  Marland Kitchen albuterol (PROVENTIL HFA;VENTOLIN HFA) 108 (90 Base) MCG/ACT inhaler Inhale 2 puffs into the lungs at bedtime. For SOB or wheezing   . amLODipine (NORVASC) 2.5 MG tablet Take 2.5 mg by mouth daily.  Jearl Klinefelter ELLIPTA 62.5-25 MCG/INH AEPB Inhale 1 puff into the lungs daily.   Marland Kitchen apixaban (ELIQUIS) 2.5 MG TABS tablet Take 1 tablet (2.5 mg total) by mouth 2 (two) times daily. Resume 07/20/17  . artificial tears (LACRILUBE) OINT ophthalmic ointment Place 1 application into both eyes at bedtime.  Marland Kitchen atorvastatin (LIPITOR) 80 MG tablet Take 80 mg by mouth daily.  . cycloSPORINE (RESTASIS) 0.05 % ophthalmic emulsion Place 1 drop into both eyes every 12 (twelve) hours.   . diclofenac sodium (VOLTAREN) 1 % GEL Apply 1 application topically 4 (four) times daily as needed. Add to body where needed  . diltiazem (CARDIZEM CD) 180 MG 24 hr capsule TAKE ONE CAPSULE BY MOUTH ONCE DAILY.  . furosemide (LASIX) 40 MG tablet Take 40 mg by mouth daily.   Marland Kitchen gabapentin (NEURONTIN) 100 MG capsule Take 100 mg by mouth 2 (two) times daily.  Marland Kitchen ipratropium-albuterol (DUONEB) 0.5-2.5 (3) MG/3ML SOLN Take 3 mLs by nebulization every 6 (six) hours as needed. Starting on 01/25/2019  For wheezing or SOB  . Melatonin 5 MG TABS Take 5 mg by mouth at bedtime as needed.  . Multiple Vitamin (MULITIVITAMIN WITH MINERALS) TABS Take 1 tablet by mouth every other day.   . nitrofurantoin, macrocrystal-monohydrate, (MACROBID) 100 MG capsule Take 100 mg by mouth 2 (two) times daily. X 7 days  . NON FORMULARY Diet Type:  Regular  . OXYGEN Inhale 2 L into the lungs 2 (two) times daily. 3:15pm to 11:15pm 11:15pm to 07:15am  . pantoprazole (PROTONIX) 40 MG tablet Take 40 mg by mouth 2 (two) times daily.   Vladimir Faster Glycol-Propyl Glycol (SYSTANE OP) Place 1 drop into both eyes 3 (three) times daily. For dry eyes  . polyethylene glycol (MIRALAX / GLYCOLAX) packet Take 17 g by mouth daily as needed. For constipation  . Probiotic Product (RISA-BID PROBIOTIC) TABS Take 1 tablet by mouth 2 (two) times a day. X 10 days  . traMADol (ULTRAM) 50 MG tablet Take 50 mg by mouth 3 (three)  times daily.  . [DISCONTINUED] Albuterol (VENTOLIN IN) Inhale into the lungs.     No facility-administered encounter medications on file as of 05/05/2019.      SIGNIFICANT DIAGNOSTIC EXAMS  LABS REVIEWED: PREVIOUS   12-07-17: chol 113; ldl 48  trig 47  hdl 56  01-26-19: wbc 11.2; hgb  13.3; hct 43.2; mcv 89.1 plt 307; glucose 83; bun 34; creat 1.00; k+ 4.1; na++ 142; ca 8.7  02-21-19: glucose 83; bun 34; creat 1.09 k+ 4.0; na++ 141 ca 8.7 03-20-19: glucose 102; bun 17; creat 0.89;  k+ 3.5; na++142 ca 8.7  TODAY:   04-09-19: glucose 94; bun 26; creat 0.95; k+ 3.6; na++ 143; ca 8.5  04-30-19: wbc 8.8 hgb 14.2; hct 4.2; mcv 87.0; plt 205; glucose 93; bun 27; creat 1.08; k+ 3.5; na++ 140; ca 9.0 total bili 0.3; albumin 4.2 urine culture: e-coli: macrobid      Review of Systems  Constitutional: Negative for malaise/fatigue.  Respiratory: Negative for cough and shortness of breath.   Cardiovascular: Negative for chest pain, palpitations and leg swelling.  Gastrointestinal: Negative for abdominal pain, constipation and heartburn.  Musculoskeletal: Negative for back pain and myalgias.  Skin: Negative.   Neurological: Negative for dizziness.  Psychiatric/Behavioral: The patient is not nervous/anxious.      Physical Exam Constitutional:      General: She is not in acute distress.    Appearance: She is well-developed. She is not diaphoretic.  Neck:     Musculoskeletal: Neck supple.     Thyroid: No thyromegaly.  Cardiovascular:     Rate and Rhythm: Normal rate and regular rhythm.     Pulses: Normal pulses.     Heart sounds: Normal heart sounds.     Comments: Pace maker Pulmonary:     Effort: Pulmonary effort is normal. No respiratory distress.     Breath sounds: Normal breath sounds.     Comments: 02 dependent  Abdominal:     General: Bowel sounds are normal. There is no distension.     Palpations: Abdomen is soft.     Tenderness: There is no abdominal tenderness.  Musculoskeletal:      Right lower leg: Edema present.     Left lower leg: Edema present.     Comments: Is able to move all extremities Has 1+ bilateral lower extremity edema  Wears ted hose  Lymphadenopathy:     Cervical: No cervical adenopathy.  Skin:    General: Skin is warm and dry.  Neurological:     Mental Status: She is alert. Mental status is at baseline.  Psychiatric:        Mood and Affect: Mood normal.     ASSESSMENT/ PLAN:   TODAY;   1. Weight loss: is worse weight in march 150 pounds on 05-04-19: 140 pounds; will stop lipitor at this time will being ensure twice daily and will monitor her status; will need to consider further treat if she continues to lose weight.   2. CVA (cerebral vascular accident); is stable will continue eliquis 2.5 mg twice daily  3. Complete heart block: is status post [employer] PTVDP pace maker will monitor    4. Chronic diastolic heart failure: EF 60-65% (06-21-17) is stable will continue lasix 40 mg daily   5. E-coli UTI: will complete macrobid 100 mg twice daily for 7 days and will monitor her status.   PREVIOUS   5. Longstanding persistent atrial fibrillation: status post pace maker insertion: stage: will continue cardizem cd 180 mg daily for rate control eliquis 2.5 mg trice daily   6. Hypertensive heart and kidney disease with chronic diastolic congestive heart failure and stage 3 chronic kidney disease: is stable b/p 129/84: will continue norvasc 2.5 mg daily   7. Chronic obstructive pulmonary disease:  is stable is 02 dependent will continue anoro 62.5/25 mcg 1 puff daily; albuterol 2 puffs nightly and duoneb every 6 hours as neeed.   8. Chronic kidney disease stage 3 (moderate): is stable bun 27; creat 1.08  9. GERD without esophagitis: is stable will continue protonix 40 mg daily twice daily   10. Chronic constipation: is stable will miralax daily as needed   11. Hypokalemia: is stable k+ 3.6  12. Dyslipidemia: is stable will stop lipitor due to  weight loss   13. Chronic bilateral low back pain without sciatica: is stable tylenol 1 gm twice daily and utlram 50 mg three times daily   14. Peripheral neuropathy: is stable will continue neurontin 100 mg twice daily      MD is aware of resident's narcotic use and is in agreement with current plan of care. We will attempt to wean resident as apropriate   Ok Edwards NP [facility]  Contact [phone] Monday through Friday 8am- 5pm  After hours call [phone]

## 2019-05-06 DIAGNOSIS — R634 Abnormal weight loss: Secondary | ICD-10-CM | POA: Insufficient documentation

## 2019-05-06 DIAGNOSIS — B9620 Unspecified Escherichia coli [E. coli] as the cause of diseases classified elsewhere: Secondary | ICD-10-CM | POA: Insufficient documentation

## 2019-05-08 NOTE — Progress Notes (Signed)
Remote pacemaker transmission.   

## 2019-05-28 ENCOUNTER — Encounter: Payer: Self-pay | Admitting: Adult Health

## 2019-05-28 ENCOUNTER — Non-Acute Institutional Stay (SKILLED_NURSING_FACILITY): Payer: Medicare Other | Admitting: Adult Health

## 2019-05-28 DIAGNOSIS — R634 Abnormal weight loss: Secondary | ICD-10-CM

## 2019-05-28 DIAGNOSIS — I5032 Chronic diastolic (congestive) heart failure: Secondary | ICD-10-CM | POA: Diagnosis not present

## 2019-05-28 NOTE — Progress Notes (Signed)
Location:   [facility] Room Number: 152 D Place of Service:  SNF (31)   CODE STATUS: DNR  Allergies  Allergen Reactions  . Sulfur Swelling and Rash    Chief Complaint  Patient presents with  . Acute Visit    weight loss    HPI:  She  is losing weight. On 03-06-19: 153 pounds on 05-25-19: 137 pounds. Per staff she has a good appetite and is drinking her ensure as directed. Her lasix had been increased to 40 mg daily in March due to her chf. There are no reports of shortness of breath no chest pain; no lower extremity edema. It may well be time to lower her lasix dosage.   Past Medical History:  Diagnosis Date  . Atrial fibrillation ([place])   . Bradycardia   . Breast nodule 11/15/2014  . Breast pain, left 11/15/2014  . Carotid artery disease ([place])   . Dizziness   . Dyspnea    Previous CPX suggesting possible restrictive physiology, respiratory muscle fatigue, diastolic dysfunction  . Essential hypertension, benign   . Hyperlipidemia   . Oxygen dependent    2 liter  . Pacemaker [place]   . Rib pain on left side 11/15/2014  . Seasonal allergies   . Shingles 05/04/2015  . Stroke ([place])   . Toe fracture, left    left big toe    Past Surgical History:  Procedure Laterality Date  . COLONOSCOPY  02/22/2012   Procedure: COLONOSCOPY;  Surgeon: Rogene Houston, MD;  [facility];  Service: Endoscopy;  Laterality: N/A;  100  . KNEE SURGERY     bilateral  . PACEMAKER IMPLANT N/A 07/17/2017   Procedure: Pacemaker Implant;  Surgeon: Deboraha Sprang, MD;  Location: [facility];  Service: Cardiovascular;  Laterality: N/A;    Social History   Socioeconomic History  . Marital status: Widowed    Spouse name: Not on file  . Number of children: Not on file  . Years of education: Not on file  . Highest education level: Not on file  Occupational History  . Not on file  Social Needs  . Financial resource strain: Not hard at all  . Food  insecurity    Worry: Never true    Inability: Never true  . Transportation needs    Medical: No    Non-medical: No  Tobacco Use  . Smoking status: Never Smoker  . Smokeless tobacco: Never Used  Substance and Sexual Activity  . Alcohol use: No  . Drug use: No  . Sexual activity: Never    Birth control/protection: Post-menopausal  Lifestyle  . Physical activity    Days per week: 0 days    Minutes per session: 0 min  . Stress: Not at all  Relationships  . Social Herbalist on phone: Three times a week    Gets together: Twice a week    Attends religious service: Never    Active member of club or organization: No    Attends meetings of clubs or organizations: Never    Relationship status: Widowed  . Intimate partner violence    Fear of current or ex partner: No    Emotionally abused: No    Physically abused: No    Forced sexual activity: No  Other Topics Concern  . Not on file  Social History Narrative  . Not on file   Family History  Problem Relation Age of  Onset  . Stroke Mother   . Pneumonia Father   . Heart disease Father   . Healthy Son   . Glaucoma Daughter   . Emphysema Daughter   . Healthy Daughter   . Other Daughter        had knee replacement  . Healthy Son   . Heart disease Maternal Grandmother       VITAL SIGNS BP 130/73   Pulse 82   Temp 97.8 F (36.6 C)   Resp 18   Ht 5\' 4"  (1.626 m)   Wt 137 lb (62.1 kg)   SpO2 98%   BMI 23.52 kg/m   Outpatient Encounter Medications as of 05/28/2019  Medication Sig  . acetaminophen (TYLENOL) 500 MG tablet Take 1,000 mg by mouth 2 (two) times daily. Max of 3 grams acetaminophen per 24 hrs from all sources.  Marland Kitchen albuterol (PROVENTIL HFA;VENTOLIN HFA) 108 (90 Base) MCG/ACT inhaler Inhale 2 puffs into the lungs at bedtime. For SOB or wheezing  . amLODipine (NORVASC) 2.5 MG tablet Take 2.5 mg by mouth daily.  Jearl Klinefelter ELLIPTA 62.5-25 MCG/INH AEPB Inhale 1 puff into the lungs daily.   Marland Kitchen apixaban  (ELIQUIS) 2.5 MG TABS tablet Take 1 tablet (2.5 mg total) by mouth 2 (two) times daily. Resume 07/20/17  . artificial tears (LACRILUBE) OINT ophthalmic ointment Place 1 application into both eyes at bedtime.  . cycloSPORINE (RESTASIS) 0.05 % ophthalmic emulsion Place 1 drop into both eyes every 12 (twelve) hours.   Marland Kitchen diltiazem (CARDIZEM CD) 180 MG 24 hr capsule TAKE ONE CAPSULE BY MOUTH ONCE DAILY.  . furosemide (LASIX) 40 MG tablet Take 40 mg by mouth daily.   Marland Kitchen gabapentin (NEURONTIN) 100 MG capsule Take 100 mg by mouth 2 (two) times daily.  Marland Kitchen ipratropium-albuterol (DUONEB) 0.5-2.5 (3) MG/3ML SOLN Take 3 mLs by nebulization every 6 (six) hours as needed. Starting on 01/25/2019  For wheezing or SOB  . Melatonin 5 MG TABS Take 5 mg by mouth at bedtime as needed.  . Multiple Vitamin (MULITIVITAMIN WITH MINERALS) TABS Take 1 tablet by mouth every other day.   . NON FORMULARY Diet Type:  Regular  . OXYGEN Inhale 2 L into the lungs 2 (two) times daily. 3:15pm to 11:15pm 11:15pm to 07:15am  . pantoprazole (PROTONIX) 40 MG tablet Take 40 mg by mouth 2 (two) times daily.   Vladimir Faster Glycol-Propyl Glycol (SYSTANE OP) Place 1 drop into both eyes 3 (three) times daily. For dry eyes  . polyethylene glycol (MIRALAX / GLYCOLAX) packet Take 17 g by mouth daily as needed. For constipation  . traMADol (ULTRAM) 50 MG tablet Take 1 tablet (50 mg total) by mouth 3 (three) times daily.  . [DISCONTINUED] Albuterol (VENTOLIN IN) Inhale into the lungs.    . [DISCONTINUED] atorvastatin (LIPITOR) 80 MG tablet Take 80 mg by mouth daily.  . [DISCONTINUED] diclofenac sodium (VOLTAREN) 1 % GEL Apply 1 application topically 4 (four) times daily as needed. Add to body where needed   No facility-administered encounter medications on file as of 05/28/2019.      SIGNIFICANT DIAGNOSTIC EXAMS  LABS REVIEWED: PREVIOUS   12-07-17: chol 113; ldl 48  trig 47 hdl 56  01-26-19: wbc 11.2; hgb  13.3; hct 43.2; mcv 89.1 plt 307;  glucose 83; bun 34; creat 1.00; k+ 4.1; na++ 142; ca 8.7  02-21-19: glucose 83; bun 34; creat 1.09 k+ 4.0; na++ 141 ca 8.7 03-20-19: glucose 102; bun 17; creat 0.89;  k+ 3.5; na++142  ca 8.7 04-09-19: glucose 94; bun 26; creat 0.95; k+ 3.6; na++ 143; ca 8.5  04-30-19: wbc 8.8 hgb 14.2; hct 4.2; mcv 87.0; plt 205; glucose 93; bun 27; creat 1.08; k+ 3.5; na++ 140; ca 9.0 total bili 0.3; albumin 4.2 urine culture: e-coli: macrobid   NO NEW LABS.     Review of Systems  Constitutional: Negative for malaise/fatigue.  Respiratory: Negative for cough and shortness of breath.   Cardiovascular: Negative for chest pain, palpitations and leg swelling.  Gastrointestinal: Negative for abdominal pain, constipation and heartburn.  Musculoskeletal: Negative for back pain, joint pain and myalgias.  Skin: Negative.   Neurological: Negative for dizziness.  Psychiatric/Behavioral: The patient is not nervous/anxious.     Physical Exam Constitutional:      General: She is not in acute distress.    Appearance: She is well-developed. She is not diaphoretic.  Neck:     Musculoskeletal: Neck supple.     Thyroid: No thyromegaly.  Cardiovascular:     Rate and Rhythm: Normal rate and regular rhythm.     Pulses: Normal pulses.     Heart sounds: Normal heart sounds.     Comments: Psychologist, forensic present Pulmonary:     Effort: Pulmonary effort is normal. No respiratory distress.     Breath sounds: Normal breath sounds.     Comments: 02 dependent  Abdominal:     General: Bowel sounds are normal. There is no distension.     Palpations: Abdomen is soft.     Tenderness: There is no abdominal tenderness.  Musculoskeletal:     Right lower leg: Edema present.     Left lower leg: Edema present.     Comments: Is able to move all extremities Has trace bilateral lower extremity edema  Wears ted hose   Lymphadenopathy:     Cervical: No cervical adenopathy.  Skin:    General: Skin is warm and dry.  Neurological:      Mental Status: She is alert. Mental status is at baseline.  Psychiatric:        Mood and Affect: Mood normal.      ASSESSMENT/ PLAN:   TODAY;   1. Weight loss: is worse: on 03-06-19: 153 pounds; on 05-25-19 weight is 137 pounds. Her lipitor had been stopped; will lower lasix to 20 mg daily and will weigh her three times weekly and will monitor her status.   2. Chronic diastolic heart failure: EF 60-65% (06-21-17) will lower her lasix to 20 mg daily and will weigh her three days weekly and will monitor her status.    MD is aware of resident's narcotic use and is in agreement with current plan of care. We will attempt to wean resident as apropriate   Ok Edwards NP [facility]  Contact [phone] Monday through Friday 8am- 5pm  After hours call [phone]

## 2019-05-29 ENCOUNTER — Encounter: Payer: Self-pay | Admitting: Adult Health

## 2019-05-29 ENCOUNTER — Non-Acute Institutional Stay (SKILLED_NURSING_FACILITY): Payer: Medicare Other | Admitting: Adult Health

## 2019-05-29 DIAGNOSIS — I4811 Longstanding persistent atrial fibrillation: Secondary | ICD-10-CM

## 2019-05-29 DIAGNOSIS — I5032 Chronic diastolic (congestive) heart failure: Secondary | ICD-10-CM

## 2019-05-29 DIAGNOSIS — I130 Hypertensive heart and chronic kidney disease with heart failure and stage 1 through stage 4 chronic kidney disease, or unspecified chronic kidney disease: Secondary | ICD-10-CM | POA: Diagnosis not present

## 2019-05-29 DIAGNOSIS — N183 Chronic kidney disease, stage 3 (moderate): Secondary | ICD-10-CM | POA: Diagnosis not present

## 2019-05-29 DIAGNOSIS — J449 Chronic obstructive pulmonary disease, unspecified: Secondary | ICD-10-CM | POA: Diagnosis not present

## 2019-05-29 NOTE — Progress Notes (Signed)
Location:   [facility] Room Number: 152 D Place of Service:  SNF (31)   CODE STATUS: DNR  Allergies  Allergen Reactions  . Sulfur Swelling and Rash    Chief Complaint  Patient presents with  . Medical Management of Chronic Issues      Longstanding persistent atrial fibrillation:  Hypertensive heart and kidney disease with chronic diastolic congestive heart failure and stage 3 chronic kidney disease:Chronic obstructive pulmonary disease:     HPI:  She is a 85 year old long term resident of this facility being seen for the management of her chronic illnesses: afib; hypertensive heart disease; copd. She denies any cough or shortness of breath. She denies any palpitations. She has had her lasix reduced to continued weight loss. There are no re reports of fevers present.   Past Medical History:  Diagnosis Date  . Atrial fibrillation ([place])   . Bradycardia   . Breast nodule 11/15/2014  . Breast pain, left 11/15/2014  . Carotid artery disease ([place])   . Dizziness   . Dyspnea    Previous CPX suggesting possible restrictive physiology, respiratory muscle fatigue, diastolic dysfunction  . Essential hypertension, benign   . Hyperlipidemia   . Oxygen dependent    2 liter  . Pacemaker [place]   . Rib pain on left side 11/15/2014  . Seasonal allergies   . Shingles 05/04/2015  . Stroke ([place])   . Toe fracture, left    left big toe    Past Surgical History:  Procedure Laterality Date  . COLONOSCOPY  02/22/2012   Procedure: COLONOSCOPY;  Surgeon: Rogene Houston, MD;  [facility];  Service: Endoscopy;  Laterality: N/A;  100  . KNEE SURGERY     bilateral  . PACEMAKER IMPLANT N/A 07/17/2017   Procedure: Pacemaker Implant;  Surgeon: Deboraha Sprang, MD;  Location: [facility];  Service: Cardiovascular;  Laterality: N/A;    Social History   Socioeconomic History  . Marital status: Widowed    Spouse name: Not on file  . Number of children:  Not on file  . Years of education: Not on file  . Highest education level: Not on file  Occupational History  . Not on file  Social Needs  . Financial resource strain: Not hard at all  . Food insecurity    Worry: Never true    Inability: Never true  . Transportation needs    Medical: No    Non-medical: No  Tobacco Use  . Smoking status: Never Smoker  . Smokeless tobacco: Never Used  Substance and Sexual Activity  . Alcohol use: No  . Drug use: No  . Sexual activity: Never    Birth control/protection: Post-menopausal  Lifestyle  . Physical activity    Days per week: 0 days    Minutes per session: 0 min  . Stress: Not at all  Relationships  . Social Herbalist on phone: Three times a week    Gets together: Twice a week    Attends religious service: Never    Active member of club or organization: No    Attends meetings of clubs or organizations: Never    Relationship status: Widowed  . Intimate partner violence    Fear of current or ex partner: No    Emotionally abused: No    Physically abused: No    Forced sexual activity: No  Other Topics Concern  . Not on file  Social History Narrative  . Not on file   Family History  Problem Relation Age of Onset  . Stroke Mother   . Pneumonia Father   . Heart disease Father   . Healthy Son   . Glaucoma Daughter   . Emphysema Daughter   . Healthy Daughter   . Other Daughter        had knee replacement  . Healthy Son   . Heart disease Maternal Grandmother       VITAL SIGNS BP (!) 123/52   Pulse 60   Temp 98 F (36.7 C)   Resp 18   Ht 5\' 4"  (1.626 m)   Wt 137 lb (62.1 kg)   SpO2 96%   BMI 23.52 kg/m   Outpatient Encounter Medications as of 05/29/2019  Medication Sig  . acetaminophen (TYLENOL) 500 MG tablet Take 1,000 mg by mouth 2 (two) times daily. Max of 3 grams acetaminophen per 24 hrs from all sources.  Marland Kitchen albuterol (PROVENTIL HFA;VENTOLIN HFA) 108 (90 Base) MCG/ACT inhaler Inhale 2 puffs into the  lungs at bedtime. For SOB or wheezing  . amLODipine (NORVASC) 2.5 MG tablet Take 2.5 mg by mouth daily.  Jearl Klinefelter ELLIPTA 62.5-25 MCG/INH AEPB Inhale 1 puff into the lungs daily.   Marland Kitchen apixaban (ELIQUIS) 2.5 MG TABS tablet Take 1 tablet (2.5 mg total) by mouth 2 (two) times daily. Resume 07/20/17  . artificial tears (LACRILUBE) OINT ophthalmic ointment Place 1 application into both eyes at bedtime.  . cycloSPORINE (RESTASIS) 0.05 % ophthalmic emulsion Place 1 drop into both eyes every 12 (twelve) hours.   Marland Kitchen diltiazem (CARDIZEM CD) 180 MG 24 hr capsule TAKE ONE CAPSULE BY MOUTH ONCE DAILY.  . furosemide (LASIX) 20 MG tablet Take 20 mg by mouth daily.   Marland Kitchen gabapentin (NEURONTIN) 100 MG capsule Take 100 mg by mouth 2 (two) times daily.  Marland Kitchen ipratropium-albuterol (DUONEB) 0.5-2.5 (3) MG/3ML SOLN Take 3 mLs by nebulization every 6 (six) hours as needed. Starting on 01/25/2019  For wheezing or SOB  . Melatonin 5 MG TABS Take 5 mg by mouth at bedtime as needed.  . Multiple Vitamin (MULITIVITAMIN WITH MINERALS) TABS Take 1 tablet by mouth every other day.   . NON FORMULARY Diet Type:  Regular  . Nutritional Supplements (ENSURE ENLIVE PO) Take 1 Bottle by mouth 2 (two) times daily between meals.  . OXYGEN Inhale 2 L into the lungs 2 (two) times daily. 3:15pm to 11:15pm 11:15pm to 07:15am  . pantoprazole (PROTONIX) 40 MG tablet Take 40 mg by mouth 2 (two) times daily.   Vladimir Faster Glycol-Propyl Glycol (SYSTANE OP) Place 1 drop into both eyes 3 (three) times daily. For dry eyes  . polyethylene glycol (MIRALAX / GLYCOLAX) packet Take 17 g by mouth daily as needed. For constipation  . traMADol (ULTRAM) 50 MG tablet Take 1 tablet (50 mg total) by mouth 3 (three) times daily.  . [DISCONTINUED] Albuterol (VENTOLIN IN) Inhale into the lungs.     No facility-administered encounter medications on file as of 05/29/2019.      SIGNIFICANT DIAGNOSTIC EXAMS   LABS REVIEWED: PREVIOUS   12-07-17: chol 113; ldl 48  trig  47 hdl 56  01-26-19: wbc 11.2; hgb  13.3; hct 43.2; mcv 89.1 plt 307; glucose 83; bun 34; creat 1.00; k+ 4.1; na++ 142; ca 8.7  02-21-19: glucose 83; bun 34; creat 1.09 k+ 4.0; na++ 141 ca 8.7 03-20-19: glucose 102; bun 17; creat 0.89;  k+ 3.5; na++142 ca  8.7 04-09-19: glucose 94; bun 26; creat 0.95; k+ 3.6; na++ 143; ca 8.5  04-30-19: wbc 8.8 hgb 14.2; hct 4.2; mcv 87.0; plt 205; glucose 93; bun 27; creat 1.08; k+ 3.5; na++ 140; ca 9.0 total bili 0.3; albumin 4.2 urine culture: e-coli: macrobid   NO NEW LABS.    Review of Systems  Constitutional: Negative for malaise/fatigue.  Respiratory: Negative for cough and shortness of breath.   Cardiovascular: Negative for chest pain, palpitations and leg swelling.  Gastrointestinal: Negative for abdominal pain, constipation and heartburn.  Musculoskeletal: Negative for back pain, joint pain and myalgias.  Skin: Negative.   Neurological: Negative for dizziness.  Psychiatric/Behavioral: The patient is not nervous/anxious.      Physical Exam Constitutional:      General: She is not in acute distress.    Appearance: She is well-developed. She is not diaphoretic.  Neck:     Musculoskeletal: Neck supple.     Thyroid: No thyromegaly.  Cardiovascular:     Rate and Rhythm: Normal rate and regular rhythm.     Pulses: Normal pulses.     Heart sounds: Normal heart sounds.     Comments: Pacemaker  Pulmonary:     Effort: Pulmonary effort is normal. No respiratory distress.     Breath sounds: Normal breath sounds.     Comments: 02 dependent  Abdominal:     General: Bowel sounds are normal. There is no distension.     Palpations: Abdomen is soft.     Tenderness: There is no abdominal tenderness.  Musculoskeletal:     Right lower leg: Edema present.     Left lower leg: Edema present.     Comments:  Is able to move all extremities Has trace bilateral lower extremity edema  Wears ted hose    Lymphadenopathy:     Cervical: No cervical adenopathy.   Skin:    General: Skin is warm and dry.  Neurological:     Mental Status: She is alert. Mental status is at baseline.  Psychiatric:        Mood and Affect: Mood normal.      ASSESSMENT/ PLAN:  TODAY;   1. Longstanding persistent atrial fibrillation: is status post pace maker: will continue cardizem cd 180 mg daily for rate control  2. Hypertensive heart and kidney disease with chronic diastolic congestive heart failure and stage 3 chronic kidney disease: is stable b/p 123/52 will continue cardizem cd 180 mg daily will stop norvasc. Will monitor   3. Chronic obstructive pulmonary disease: is stable is 02 dependent will continue anoro 62.5/25 mcg 1 puff daily albuterol 2 puffs nighty has duoneb every 6 hours as needed.    PREVIOUS   4. Chronic kidney disease stage 3 (moderate): is stable bun 27; creat 1.08  5. GERD without esophagitis: is stable will continue protonix 40 mg daily twice daily   6. Chronic constipation: is stable will miralax daily as needed   7. Dyslipidemia: is stable is off lipitor will monitor   8. Chronic bilateral low back pain without sciatica: is stable tylenol 1 gm twice daily and utlram 50 mg three times daily   9. Peripheral neuropathy: is stable will continue neurontin 100 mg twice daily   10. CVA (cerebral vascular accident); is stable will continue eliquis 2.5 mg twice daily  11. Complete heart block: is status post [employer] PTVDP pace maker will monitor    12. Chronic diastolic heart failure: EF 60-65% (06-21-17) is stable will continue lasix 20  mg daily   13. Weight loss: is without change on 03-06-19: 153 pounds; on 05-25-19 weight is 137 pounds.her lasix has been lowered to 20 mg daily her lipitor has been stopped; will continue supplements as indicated.      MD is aware of resident's narcotic use and is in agreement with current plan of care. We will attempt to wean resident as apropriate   Ok Edwards NP [facility]   Contact [phone] Monday through Friday 8am- 5pm  After hours call [phone]

## 2019-06-04 ENCOUNTER — Other Ambulatory Visit: Payer: Self-pay | Admitting: *Deleted

## 2019-06-08 ENCOUNTER — Other Ambulatory Visit: Payer: Self-pay | Admitting: Adult Health

## 2019-06-08 MED ORDER — TRAMADOL HCL 50 MG PO TABS: 50.0000 mg | ORAL_TABLET | Freq: Three times a day (TID) | ORAL | 0 refills | Status: DC

## 2019-06-26 ENCOUNTER — Encounter: Payer: Self-pay | Admitting: Adult Health

## 2019-06-26 ENCOUNTER — Non-Acute Institutional Stay (SKILLED_NURSING_FACILITY): Payer: Medicare Other | Admitting: Adult Health

## 2019-06-26 DIAGNOSIS — N183 Chronic kidney disease, stage 3 unspecified: Secondary | ICD-10-CM

## 2019-06-26 DIAGNOSIS — K219 Gastro-esophageal reflux disease without esophagitis: Secondary | ICD-10-CM | POA: Diagnosis not present

## 2019-06-26 DIAGNOSIS — K5909 Other constipation: Secondary | ICD-10-CM | POA: Diagnosis not present

## 2019-06-26 NOTE — Progress Notes (Signed)
Location:   [place] Room Number: 55 D Place of Service:  SNF (31)   CODE STATUS: DNR  Allergies  Allergen Reactions   Sulfur Swelling and Rash    Chief Complaint  Patient presents with   Medical Management of Chronic Issues       Chronic kidney disease stage 3 (moderate)  GERD without esophagitis:Chronic constipation    HPI:  She is a 85 year old long term resident of this facility being seen for the management of her chronic illnesses: ckd stage 3; gerd; constipation. She denies any heart burn; no constipation; no stomach pain; no changes in her appetite.   Past Medical History:  Diagnosis Date   Atrial fibrillation (HCC)    Bradycardia    Breast nodule 11/15/2014   Breast pain, left 11/15/2014   Carotid artery disease (HCC)    Dizziness    Dyspnea    Previous CPX suggesting possible restrictive physiology, respiratory muscle fatigue, diastolic dysfunction   Essential hypertension, benign    Hyperlipidemia    Oxygen dependent    2 liter   Pacemaker [employer]    Rib pain on left side 11/15/2014   Seasonal allergies    Shingles 05/04/2015   Stroke ([place])    Toe fracture, left    left big toe    Past Surgical History:  Procedure Laterality Date   COLONOSCOPY  02/22/2012   Procedure: COLONOSCOPY;  Surgeon: Rogene Houston, MD;  [facility];  Service: Endoscopy;  Laterality: N/A;  100   KNEE SURGERY     bilateral   PACEMAKER IMPLANT N/A 07/17/2017   Procedure: Pacemaker Implant;  Surgeon: Deboraha Sprang, MD;  Location: [place] [facility];  Service: Cardiovascular;  Laterality: N/A;    Social History   Socioeconomic History   Marital status: Widowed    Spouse name: Not on file   Number of children: Not on file   Years of education: Not on file   Highest education level: Not on file  Occupational History   Not on file  Social Needs   Financial resource strain: Not hard at all   Food  insecurity    Worry: Never true    Inability: Never true   Transportation needs    Medical: No    Non-medical: No  Tobacco Use   Smoking status: Never Smoker   Smokeless tobacco: Never Used  Substance and Sexual Activity   Alcohol use: No   Drug use: No   Sexual activity: Never    Birth control/protection: Post-menopausal  Lifestyle   Physical activity    Days per week: 0 days    Minutes per session: 0 min   Stress: Not at all  Relationships   Social connections    Talks on phone: Three times a week    Gets together: Twice a week    Attends religious service: Never    Active member of club or organization: No    Attends meetings of clubs or organizations: Never    Relationship status: Widowed   Intimate partner violence    Fear of current or ex partner: No    Emotionally abused: No    Physically abused: No    Forced sexual activity: No  Other Topics Concern   Not on file  Social History Narrative   Not on file   Family History  Problem Relation Age of Onset   Stroke Mother    Pneumonia Father  Heart disease Father    Healthy Son    Glaucoma Daughter    Emphysema Daughter    Healthy Daughter    Other Daughter        had knee replacement   Healthy Son    Heart disease Maternal Grandmother       VITAL SIGNS BP 127/68    Pulse 85    Temp 98.1 F (36.7 C)    Resp 18    Ht 5\' 4"  (1.626 m)    Wt 138 lb 6.4 oz (62.8 kg)    SpO2 93%    BMI 23.76 kg/m   Outpatient Encounter Medications as of 06/26/2019  Medication Sig   acetaminophen (TYLENOL) 500 MG tablet Take 1,000 mg by mouth 2 (two) times daily. Max of 3 grams acetaminophen per 24 hrs from all sources.   albuterol (PROVENTIL HFA;VENTOLIN HFA) 108 (90 Base) MCG/ACT inhaler Inhale 2 puffs into the lungs at bedtime. For SOB or wheezing   ANORO ELLIPTA 62.5-25 MCG/INH AEPB Inhale 1 puff into the lungs daily.    apixaban (ELIQUIS) 2.5 MG TABS tablet Take 1 tablet (2.5 mg total) by mouth  2 (two) times daily. Resume 07/20/17   artificial tears (LACRILUBE) OINT ophthalmic ointment Place 1 application into both eyes at bedtime.   cycloSPORINE (RESTASIS) 0.05 % ophthalmic emulsion Place 1 drop into both eyes every 12 (twelve) hours.    diltiazem (CARDIZEM CD) 180 MG 24 hr capsule TAKE ONE CAPSULE BY MOUTH ONCE DAILY.   furosemide (LASIX) 20 MG tablet Take 20 mg by mouth daily.    gabapentin (NEURONTIN) 100 MG capsule Take 100 mg by mouth 2 (two) times daily.   ipratropium-albuterol (DUONEB) 0.5-2.5 (3) MG/3ML SOLN Take 3 mLs by nebulization every 6 (six) hours as needed. Starting on 01/25/2019  For wheezing or SOB   Melatonin 5 MG TABS Take 5 mg by mouth at bedtime as needed.   Multiple Vitamin (MULITIVITAMIN WITH MINERALS) TABS Take 1 tablet by mouth every other day.    NON FORMULARY Diet Type:  Regular   Nutritional Supplements (ENSURE ENLIVE PO) Take 1 Bottle by mouth 2 (two) times daily between meals.   OXYGEN Inhale 2 L into the lungs 2 (two) times daily. 3:15pm to 11:15pm 11:15pm to 07:15am   pantoprazole (PROTONIX) 40 MG tablet Take 40 mg by mouth 2 (two) times daily.    Polyethyl Glycol-Propyl Glycol (SYSTANE OP) Place 1 drop into both eyes 3 (three) times daily. For dry eyes   polyethylene glycol (MIRALAX / GLYCOLAX) packet Take 17 g by mouth daily as needed. For constipation   traMADol (ULTRAM) 50 MG tablet Take 1 tablet (50 mg total) by mouth 3 (three) times daily.   [DISCONTINUED] Albuterol (VENTOLIN IN) Inhale into the lungs.     [DISCONTINUED] amLODipine (NORVASC) 2.5 MG tablet Take 2.5 mg by mouth daily.   No facility-administered encounter medications on file as of 06/26/2019.      SIGNIFICANT DIAGNOSTIC EXAMS  LABS REVIEWED: PREVIOUS   12-07-17: chol 113; ldl 48  trig 47 hdl 56  01-26-19: wbc 11.2; hgb  13.3; hct 43.2; mcv 89.1 plt 307; glucose 83; bun 34; creat 1.00; k+ 4.1; na++ 142; ca 8.7  02-21-19: glucose 83; bun 34; creat 1.09 k+ 4.0;  na++ 141 ca 8.7 03-20-19: glucose 102; bun 17; creat 0.89;  k+ 3.5; na++142 ca 8.7 04-09-19: glucose 94; bun 26; creat 0.95; k+ 3.6; na++ 143; ca 8.5  04-30-19: wbc 8.8 hgb 14.2; hct 4.2;  mcv 87.0; plt 205; glucose 93; bun 27; creat 1.08; k+ 3.5; na++ 140; ca 9.0 total bili 0.3; albumin 4.2 urine culture: e-coli: macrobid   NO NEW LABS.     Review of Systems  Constitutional: Negative for malaise/fatigue.  Respiratory: Negative for cough and shortness of breath.   Cardiovascular: Negative for chest pain, palpitations and leg swelling.  Gastrointestinal: Negative for abdominal pain, constipation and heartburn.  Musculoskeletal: Negative for back pain, joint pain and myalgias.  Skin: Negative.   Neurological: Negative for dizziness.  Psychiatric/Behavioral: The patient is not nervous/anxious.    Physical Exam Constitutional:      General: She is not in acute distress.    Appearance: She is well-developed. She is not diaphoretic.  Neck:     Musculoskeletal: Neck supple.     Thyroid: No thyromegaly.  Cardiovascular:     Rate and Rhythm: Normal rate and regular rhythm.     Pulses: Normal pulses.     Heart sounds: Normal heart sounds.     Comments: Pace maker  Pulmonary:     Effort: Pulmonary effort is normal. No respiratory distress.     Breath sounds: Normal breath sounds.     Comments: 02 dependent  Abdominal:     General: Bowel sounds are normal. There is no distension.     Palpations: Abdomen is soft.     Tenderness: There is no abdominal tenderness.  Musculoskeletal:     Right lower leg: Edema present.     Left lower leg: Edema present.     Comments: Is able to move all extremities Has trace bilateral lower extremity edema  Wears ted hose   Lymphadenopathy:     Cervical: No cervical adenopathy.  Skin:    General: Skin is warm and dry.  Neurological:     Mental Status: She is alert. Mental status is at baseline.  Psychiatric:        Mood and Affect: Mood normal.        ASSESSMENT/ PLAN:  TODAY;   1. Chronic kidney disease stage 3 (moderate) is stable bun 27; creat 1.08 will monitor   2. GERD without esophagitis: is stable will continue protonix 40 mg twice daily   3. Chronic constipation: is stable will continue miralax daily as needed   PREVIOUS   4. Dyslipidemia: is stable is off lipitor will monitor   5. Chronic bilateral low back pain without sciatica: is stable tylenol 1 gm twice daily and utlram 50 mg three times daily   6. Peripheral neuropathy: is stable will continue neurontin 100 mg twice daily   7. CVA (cerebral vascular accident); is stable will continue eliquis 2.5 mg twice daily  8. Complete heart block: is status post [employer] PTVDP pace maker will monitor    9. Chronic diastolic heart failure: EF 60-65% (06-21-17) is stable is presently off medications will monitor her status.   10. Weight loss: is without change on 03-06-19: 153 pounds;  05-25-19 weight 137 pounds her current weight is 138 pounds will continue supplements as directed and will monitor her status   11. Longstanding persistent atrial fibrillation: is status post pace maker: will continue cardizem cd 180 mg daily for rate control  12. Hypertensive heart and kidney disease with chronic diastolic congestive heart failure and stage 3 chronic kidney disease: is stable b/p 127/68 will continue cardizem cd 180 mg daily is off norvasc . Will monitor   13. Chronic obstructive pulmonary disease: is stable is 02 dependent will continue  anoro 62.5/25 mcg 1 puff daily albuterol 2 puffs nighty has duoneb every 6 hours as needed.      MD is aware of resident's narcotic use and is in agreement with current plan of care. We will attempt to wean resident as apropriate   Ok Edwards NP [facility]  Contact [phone] Monday through Friday 8am- 5pm  After hours call [phone]

## 2019-07-01 ENCOUNTER — Non-Acute Institutional Stay (SKILLED_NURSING_FACILITY): Payer: Medicare Other | Admitting: Adult Health

## 2019-07-01 ENCOUNTER — Encounter: Payer: Self-pay | Admitting: Adult Health

## 2019-07-01 DIAGNOSIS — J449 Chronic obstructive pulmonary disease, unspecified: Secondary | ICD-10-CM | POA: Diagnosis not present

## 2019-07-01 DIAGNOSIS — I639 Cerebral infarction, unspecified: Secondary | ICD-10-CM

## 2019-07-01 DIAGNOSIS — I5032 Chronic diastolic (congestive) heart failure: Secondary | ICD-10-CM

## 2019-07-01 NOTE — Progress Notes (Signed)
Location:   [place] Room Number: 152 D Place of Service:  SNF (31)   CODE STATUS: DNR  Allergies  Allergen Reactions  . Sulfur Swelling and Rash    Chief Complaint  Patient presents with  . Acute Visit    Care Plan Meeting    HPI:  We have come together for her routine care plan meeting. BIMS 13/15 mood 6/30. She does have family present via phone. She has lost 9 pounds in the past 3 months. She is eating 50-100 % of her meals. She denies any uncontrolled pain; she denies any changes in her appetite; no insomnia; no anxiety. No reports of recent falls. Her family would like to have her in a room near a window.  Past Medical History:  Diagnosis Date  . Atrial fibrillation ([place])   . Bradycardia   . Breast nodule 11/15/2014  . Breast pain, left 11/15/2014  . Carotid artery disease ([place])   . Dizziness   . Dyspnea    Previous CPX suggesting possible restrictive physiology, respiratory muscle fatigue, diastolic dysfunction  . Essential hypertension, benign   . Hyperlipidemia   . Oxygen dependent    2 liter  . Pacemaker [place]   . Rib pain on left side 11/15/2014  . Seasonal allergies   . Shingles 05/04/2015  . Stroke ([place])   . Toe fracture, left    left big toe    Past Surgical History:  Procedure Laterality Date  . COLONOSCOPY  02/22/2012   Procedure: COLONOSCOPY;  Surgeon: Rogene Houston, MD;  [facility];  Service: Endoscopy;  Laterality: N/A;  100  . KNEE SURGERY     bilateral  . PACEMAKER IMPLANT N/A 07/17/2017   Procedure: Pacemaker Implant;  Surgeon: Deboraha Sprang, MD;  Location: [place] [facility];  Service: Cardiovascular;  Laterality: N/A;    Social History   Socioeconomic History  . Marital status: Widowed    Spouse name: Not on file  . Number of children: Not on file  . Years of education: Not on file  . Highest education level: Not on file  Occupational History  . Not on file  Social Needs  . Financial  resource strain: Not hard at all  . Food insecurity    Worry: Never true    Inability: Never true  . Transportation needs    Medical: No    Non-medical: No  Tobacco Use  . Smoking status: Never Smoker  . Smokeless tobacco: Never Used  Substance and Sexual Activity  . Alcohol use: No  . Drug use: No  . Sexual activity: Never    Birth control/protection: Post-menopausal  Lifestyle  . Physical activity    Days per week: 0 days    Minutes per session: 0 min  . Stress: Not at all  Relationships  . Social Herbalist on phone: Three times a week    Gets together: Twice a week    Attends religious service: Never    Active member of club or organization: No    Attends meetings of clubs or organizations: Never    Relationship status: Widowed  . Intimate partner violence    Fear of current or ex partner: No    Emotionally abused: No    Physically abused: No    Forced sexual activity: No  Other Topics Concern  . Not on file  Social History Narrative  . Not on file   Family  History  Problem Relation Age of Onset  . Stroke Mother   . Pneumonia Father   . Heart disease Father   . Healthy Son   . Glaucoma Daughter   . Emphysema Daughter   . Healthy Daughter   . Other Daughter        had knee replacement  . Healthy Son   . Heart disease Maternal Grandmother       VITAL SIGNS BP 127/68   Pulse 85   Temp (!) 97.2 F (36.2 C)   Resp 18   Ht 5\' 4"  (1.626 m)   Wt 139 lb (63 kg)   SpO2 98%   BMI 23.86 kg/m   Outpatient Encounter Medications as of 07/01/2019  Medication Sig  . acetaminophen (TYLENOL) 500 MG tablet Take 1,000 mg by mouth 2 (two) times daily. Max of 3 grams acetaminophen per 24 hrs from all sources.  Marland Kitchen albuterol (PROVENTIL HFA;VENTOLIN HFA) 108 (90 Base) MCG/ACT inhaler Inhale 2 puffs into the lungs at bedtime. For SOB or wheezing  . ANORO ELLIPTA 62.5-25 MCG/INH AEPB Inhale 1 puff into the lungs daily.   Marland Kitchen apixaban (ELIQUIS) 2.5 MG TABS tablet  Take 1 tablet (2.5 mg total) by mouth 2 (two) times daily. Resume 07/20/17  . artificial tears (LACRILUBE) OINT ophthalmic ointment Place 1 application into both eyes at bedtime.  . cycloSPORINE (RESTASIS) 0.05 % ophthalmic emulsion Place 1 drop into both eyes every 12 (twelve) hours.   Marland Kitchen diltiazem (CARDIZEM CD) 180 MG 24 hr capsule TAKE ONE CAPSULE BY MOUTH ONCE DAILY.  . furosemide (LASIX) 20 MG tablet Take 20 mg by mouth daily.   Marland Kitchen gabapentin (NEURONTIN) 100 MG capsule Take 100 mg by mouth 2 (two) times daily.  Marland Kitchen ipratropium-albuterol (DUONEB) 0.5-2.5 (3) MG/3ML SOLN Take 3 mLs by nebulization every 6 (six) hours as needed. Starting on 01/25/2019  For wheezing or SOB  . Melatonin 5 MG TABS Take 5 mg by mouth at bedtime as needed.  . Multiple Vitamin (MULITIVITAMIN WITH MINERALS) TABS Take 1 tablet by mouth every other day.   . NON FORMULARY Diet Type:  Regular  . Nutritional Supplements (ENSURE ENLIVE PO) Take 1 Bottle by mouth 2 (two) times daily between meals.  . OXYGEN Inhale 2 L into the lungs 2 (two) times daily. 3:15pm to 11:15pm 11:15pm to 07:15am  . pantoprazole (PROTONIX) 40 MG tablet Take 40 mg by mouth 2 (two) times daily.   Vladimir Faster Glycol-Propyl Glycol (SYSTANE OP) Place 1 drop into both eyes 3 (three) times daily. For dry eyes  . polyethylene glycol (MIRALAX / GLYCOLAX) packet Take 17 g by mouth daily as needed. For constipation  . traMADol (ULTRAM) 50 MG tablet Take 1 tablet (50 mg total) by mouth 3 (three) times daily.  . [DISCONTINUED] Albuterol (VENTOLIN IN) Inhale into the lungs.     No facility-administered encounter medications on file as of 07/01/2019.      SIGNIFICANT DIAGNOSTIC EXAMS  LABS REVIEWED: PREVIOUS   12-07-17: chol 113; ldl 48  trig 47 hdl 56  01-26-19: wbc 11.2; hgb  13.3; hct 43.2; mcv 89.1 plt 307; glucose 83; bun 34; creat 1.00; k+ 4.1; na++ 142; ca 8.7  02-21-19: glucose 83; bun 34; creat 1.09 k+ 4.0; na++ 141 ca 8.7 03-20-19: glucose 102; bun 17;  creat 0.89;  k+ 3.5; na++142 ca 8.7 04-09-19: glucose 94; bun 26; creat 0.95; k+ 3.6; na++ 143; ca 8.5  04-30-19: wbc 8.8 hgb 14.2; hct 4.2; mcv 87.0; plt  205; glucose 93; bun 27; creat 1.08; k+ 3.5; na++ 140; ca 9.0 total bili 0.3; albumin 4.2 urine culture: e-coli: macrobid   NO NEW LABS.     Review of Systems  Constitutional: Negative for malaise/fatigue.  Respiratory: Negative for cough and shortness of breath.   Cardiovascular: Negative for chest pain, palpitations and leg swelling.  Gastrointestinal: Negative for abdominal pain, constipation and heartburn.  Musculoskeletal: Negative for back pain, joint pain and myalgias.  Skin: Negative.   Neurological: Negative for dizziness.  Psychiatric/Behavioral: The patient is not nervous/anxious.      Physical Exam Constitutional:      General: She is not in acute distress.    Appearance: She is well-developed. She is not diaphoretic.  Neck:     Musculoskeletal: Neck supple.     Thyroid: No thyromegaly.  Cardiovascular:     Rate and Rhythm: Normal rate and regular rhythm.     Pulses: Normal pulses.     Heart sounds: Normal heart sounds.     Comments: Pace maker Pulmonary:     Effort: Pulmonary effort is normal. No respiratory distress.     Breath sounds: Normal breath sounds.  Abdominal:     General: Bowel sounds are normal. There is no distension.     Palpations: Abdomen is soft.     Tenderness: There is no abdominal tenderness.  Musculoskeletal:     Right lower leg: No edema.     Left lower leg: No edema.     Comments: Is able to move all extremities Has trace bilateral lower extremity edema  Wears ted hose  Lymphadenopathy:     Cervical: No cervical adenopathy.  Skin:    General: Skin is warm and dry.  Neurological:     Mental Status: She is alert. Mental status is at baseline.  Psychiatric:        Mood and Affect: Mood normal.        ASSESSMENT/ PLAN:  TODAY;   1.  Cerebrovascular accident (CVA)  unspecified mechanism 2 chronic diastolic heart failure 3. Chronic obstructive pulmonary disease unspecified COPD type  Will continue her current medications Will continue current plan of care Will continue to monitor her status.      MD is aware of resident's narcotic use and is in agreement with current plan of care. We will attempt to wean resident as apropriate   Ok Edwards NP [facility] Adult [facility]  Contact [phone] Monday through Friday 8am- 5pm  After hours call [phone]

## 2019-07-02 ENCOUNTER — Non-Acute Institutional Stay (SKILLED_NURSING_FACILITY): Payer: Medicare Other | Admitting: Adult Health

## 2019-07-02 ENCOUNTER — Encounter: Payer: Self-pay | Admitting: Adult Health

## 2019-07-02 ENCOUNTER — Encounter (HOSPITAL_COMMUNITY)
Admission: RE | Admit: 2019-07-02 | Discharge: 2019-07-02 | Disposition: A | Discharge status: Home / Self Care | Payer: Medicare Other | Source: Ambulatory Visit | Attending: Adult Health | Admitting: Adult Health

## 2019-07-02 DIAGNOSIS — M25519 Pain in unspecified shoulder: Secondary | ICD-10-CM | POA: Diagnosis not present

## 2019-07-02 DIAGNOSIS — M542 Cervicalgia: Secondary | ICD-10-CM | POA: Diagnosis not present

## 2019-07-02 DIAGNOSIS — I130 Hypertensive heart and chronic kidney disease with heart failure and stage 1 through stage 4 chronic kidney disease, or unspecified chronic kidney disease: Secondary | ICD-10-CM | POA: Diagnosis not present

## 2019-07-02 LAB — BASIC METABOLIC PANEL
Anion gap: 9 (ref 5–15)
BUN: 29 mg/dL — ABNORMAL HIGH (ref 8–23)
CO2: 26 mmol/L (ref 22–32)
Calcium: 8.7 mg/dL — ABNORMAL LOW (ref 8.9–10.3)
Chloride: 106 mmol/L (ref 98–111)
Creatinine, Ser: 0.96 mg/dL (ref 0.44–1.00)
GFR calc Af Amer: 57 mL/min — ABNORMAL LOW (ref >60)
GFR calc non Af Amer: 49 mL/min — ABNORMAL LOW (ref >60)
Glucose, Bld: 91 mg/dL (ref 70–99)
Potassium: 4.3 mmol/L (ref 3.5–5.1)
Sodium: 141 mmol/L (ref 135–145)

## 2019-07-02 NOTE — Progress Notes (Signed)
Location:   [facility] Room Number: 152 D Place of Service:  SNF (31)   CODE STATUS: DNR  Allergies  Allergen Reactions  . Sulfur Swelling and Rash    Chief Complaint  Patient presents with  . Acute Visit    Left shoulder / neck pain    HPI:  She is complaining of left shoulder and neck pain; she tells me today that she has had this pain for at least 2 weeks. The staff reports that today is the first day she has told them about the pain. She describes the pain as achy and hurts with movement the pain goes to elbow and neck. She is sleeping at night.    Past Medical History:  Diagnosis Date  . Atrial fibrillation ([place])   . Bradycardia   . Breast nodule 11/15/2014  . Breast pain, left 11/15/2014  . Carotid artery disease ([place])   . Dizziness   . Dyspnea    Previous CPX suggesting possible restrictive physiology, respiratory muscle fatigue, diastolic dysfunction  . Essential hypertension, benign   . Hyperlipidemia   . Oxygen dependent    2 liter  . Pacemaker [place]   . Rib pain on left side 11/15/2014  . Seasonal allergies   . Shingles 05/04/2015  . Stroke ([place])   . Toe fracture, left    left big toe    Past Surgical History:  Procedure Laterality Date  . COLONOSCOPY  02/22/2012   Procedure: COLONOSCOPY;  Surgeon: Rogene Houston, MD;  [facility];  Service: Endoscopy;  Laterality: N/A;  100  . KNEE SURGERY     bilateral  . PACEMAKER IMPLANT N/A 07/17/2017   Procedure: Pacemaker Implant;  Surgeon: Deboraha Sprang, MD;  Location: [facility];  Service: Cardiovascular;  Laterality: N/A;    Social History   Socioeconomic History  . Marital status: Widowed    Spouse name: Not on file  . Number of children: Not on file  . Years of education: Not on file  . Highest education level: Not on file  Occupational History  . Not on file  Social Needs  . Financial resource strain: Not hard at all  . Food insecurity   Worry: Never true    Inability: Never true  . Transportation needs    Medical: No    Non-medical: No  Tobacco Use  . Smoking status: Never Smoker  . Smokeless tobacco: Never Used  Substance and Sexual Activity  . Alcohol use: No  . Drug use: No  . Sexual activity: Never    Birth control/protection: Post-menopausal  Lifestyle  . Physical activity    Days per week: 0 days    Minutes per session: 0 min  . Stress: Not at all  Relationships  . Social Herbalist on phone: Three times a week    Gets together: Twice a week    Attends religious service: Never    Active member of club or organization: No    Attends meetings of clubs or organizations: Never    Relationship status: Widowed  . Intimate partner violence    Fear of current or ex partner: No    Emotionally abused: No    Physically abused: No    Forced sexual activity: No  Other Topics Concern  . Not on file  Social History Narrative  . Not on file   Family History  Problem Relation Age of Onset  .  Stroke Mother   . Pneumonia Father   . Heart disease Father   . Healthy Son   . Glaucoma Daughter   . Emphysema Daughter   . Healthy Daughter   . Other Daughter        had knee replacement  . Healthy Son   . Heart disease Maternal Grandmother       VITAL SIGNS Ht 5\' 4"  (1.626 m)   Wt 139 lb 6.4 oz (63.2 kg)   BMI 23.93 kg/m   Outpatient Encounter Medications as of 07/02/2019  Medication Sig  . acetaminophen (TYLENOL) 500 MG tablet Take 1,000 mg by mouth 2 (two) times daily. Max of 3 grams acetaminophen per 24 hrs from all sources.  Marland Kitchen albuterol (PROVENTIL HFA;VENTOLIN HFA) 108 (90 Base) MCG/ACT inhaler Inhale 2 puffs into the lungs at bedtime. For SOB or wheezing  . ANORO ELLIPTA 62.5-25 MCG/INH AEPB Inhale 1 puff into the lungs daily.   Marland Kitchen apixaban (ELIQUIS) 2.5 MG TABS tablet Take 1 tablet (2.5 mg total) by mouth 2 (two) times daily. Resume 07/20/17  . artificial tears (LACRILUBE) OINT ophthalmic  ointment Place 1 application into both eyes at bedtime.  . cycloSPORINE (RESTASIS) 0.05 % ophthalmic emulsion Place 1 drop into both eyes every 12 (twelve) hours.   Marland Kitchen diltiazem (CARDIZEM CD) 180 MG 24 hr capsule TAKE ONE CAPSULE BY MOUTH ONCE DAILY.  . furosemide (LASIX) 20 MG tablet Take 20 mg by mouth daily.   Marland Kitchen gabapentin (NEURONTIN) 100 MG capsule Take 100 mg by mouth 2 (two) times daily.  Marland Kitchen ipratropium-albuterol (DUONEB) 0.5-2.5 (3) MG/3ML SOLN Take 3 mLs by nebulization every 6 (six) hours as needed. Starting on 01/25/2019  For wheezing or SOB  . Melatonin 5 MG TABS Take 5 mg by mouth at bedtime as needed.  . Multiple Vitamin (MULITIVITAMIN WITH MINERALS) TABS Take 1 tablet by mouth every other day.   . NON FORMULARY Diet Type:  Regular  . Nutritional Supplements (ENSURE ENLIVE PO) Take 1 Bottle by mouth 2 (two) times daily between meals.  . OXYGEN Inhale 2 L into the lungs 2 (two) times daily. 3:15pm to 11:15pm 11:15pm to 07:15am  . pantoprazole (PROTONIX) 40 MG tablet Take 40 mg by mouth 2 (two) times daily.   Vladimir Faster Glycol-Propyl Glycol (SYSTANE OP) Place 1 drop into both eyes 3 (three) times daily. For dry eyes  . polyethylene glycol (MIRALAX / GLYCOLAX) packet Take 17 g by mouth daily as needed. For constipation  . traMADol (ULTRAM) 50 MG tablet Take 1 tablet (50 mg total) by mouth 3 (three) times daily.  . [DISCONTINUED] Albuterol (VENTOLIN IN) Inhale into the lungs.     No facility-administered encounter medications on file as of 07/02/2019.      SIGNIFICANT DIAGNOSTIC EXAMS   LABS REVIEWED: PREVIOUS   12-07-17: chol 113; ldl 48  trig 47 hdl 56  01-26-19: wbc 11.2; hgb  13.3; hct 43.2; mcv 89.1 plt 307; glucose 83; bun 34; creat 1.00; k+ 4.1; na++ 142; ca 8.7  02-21-19: glucose 83; bun 34; creat 1.09 k+ 4.0; na++ 141 ca 8.7 03-20-19: glucose 102; bun 17; creat 0.89;  k+ 3.5; na++142 ca 8.7 04-09-19: glucose 94; bun 26; creat 0.95; k+ 3.6; na++ 143; ca 8.5  04-30-19: wbc 8.8  hgb 14.2; hct 4.2; mcv 87.0; plt 205; glucose 93; bun 27; creat 1.08; k+ 3.5; na++ 140; ca 9.0 total bili 0.3; albumin 4.2 urine culture: e-coli: macrobid   NO NEW LABS.  Review of Systems  Constitutional: Negative for malaise/fatigue.  Respiratory: Negative for cough and shortness of breath.   Cardiovascular: Negative for chest pain, palpitations and leg swelling.  Gastrointestinal: Negative for abdominal pain, constipation and heartburn.  Musculoskeletal: Positive for back pain and myalgias. Negative for joint pain.  Skin: Negative.   Neurological: Negative for dizziness.  Psychiatric/Behavioral: The patient is not nervous/anxious.     Physical Exam Constitutional:      General: She is not in acute distress.    Appearance: She is well-developed. She is not diaphoretic.  Neck:     Musculoskeletal: Neck supple.     Thyroid: No thyromegaly.  Cardiovascular:     Rate and Rhythm: Normal rate and regular rhythm.     Pulses: Normal pulses.     Heart sounds: Normal heart sounds.     Comments: Pace maker  Pulmonary:     Effort: Pulmonary effort is normal. No respiratory distress.     Breath sounds: Normal breath sounds.  Abdominal:     General: Bowel sounds are normal. There is no distension.     Palpations: Abdomen is soft.     Tenderness: There is no abdominal tenderness.  Musculoskeletal:     Right lower leg: No edema.     Left lower leg: No edema.     Comments: Is able to move all extremities Has trace bilateral lower extremity edema  Wears ted hose Has tenderness to palpation to left shoulder and left neck    Lymphadenopathy:     Cervical: No cervical adenopathy.  Skin:    General: Skin is warm and dry.  Neurological:     Mental Status: She is alert. Mental status is at baseline.  Psychiatric:        Mood and Affect: Mood normal.        ASSESSMENT/ PLAN:  TODAY;   1. Acute left and neck pain: will have therapy see her for pain management will begin biofreeze  twice daily and will monitor her status.   MD is aware of resident's narcotic use and is in agreement with current plan of care. We will attempt to wean resident as apropriate   Ok Edwards NP [facility]  Contact [phone] Monday through Friday 8am- 5pm  After hours call [phone]

## 2019-07-03 DIAGNOSIS — M25519 Pain in unspecified shoulder: Secondary | ICD-10-CM | POA: Insufficient documentation

## 2019-07-03 DIAGNOSIS — M542 Cervicalgia: Secondary | ICD-10-CM | POA: Insufficient documentation

## 2019-07-21 ENCOUNTER — Ambulatory Visit (INDEPENDENT_AMBULATORY_CARE_PROVIDER_SITE_OTHER): Payer: Medicare Other | Admitting: *Deleted

## 2019-07-21 DIAGNOSIS — I442 Atrioventricular block, complete: Secondary | ICD-10-CM

## 2019-07-21 LAB — CUP PACEART REMOTE DEVICE CHECK
Battery Remaining Longevity: 129 mo
Battery Remaining Percentage: 95.5 %
Battery Voltage: 3.01 V
Brady Statistic RV Percent Paced: 99 %
Date Time Interrogation Session: 20200811060014
Implantable Lead Implant Date: 20180808
Implantable Lead Location: 753860
Implantable Lead Model: 1948
Implantable Pulse Generator Implant Date: 20180808
Lead Channel Impedance Value: 690 Ohm
Lead Channel Pacing Threshold Amplitude: 0.5 V
Lead Channel Pacing Threshold Pulse Width: 0.5 ms
Lead Channel Sensing Intrinsic Amplitude: 12 mV
Lead Channel Setting Pacing Amplitude: 2.5 V
Lead Channel Setting Pacing Pulse Width: 0.5 ms
Lead Channel Setting Sensing Sensitivity: 0.5 mV
Pulse Gen Model: 1272
Pulse Gen Serial Number: 7957820

## 2019-07-24 ENCOUNTER — Other Ambulatory Visit: Payer: Self-pay | Admitting: Adult Health

## 2019-07-24 MED ORDER — TRAMADOL HCL 50 MG PO TABS: 50.0000 mg | ORAL_TABLET | Freq: Three times a day (TID) | ORAL | 0 refills | Status: DC

## 2019-07-27 DIAGNOSIS — M542 Cervicalgia: Secondary | ICD-10-CM | POA: Diagnosis not present

## 2019-07-27 DIAGNOSIS — M546 Pain in thoracic spine: Secondary | ICD-10-CM | POA: Diagnosis not present

## 2019-07-27 DIAGNOSIS — M9901 Segmental and somatic dysfunction of cervical region: Secondary | ICD-10-CM | POA: Diagnosis not present

## 2019-07-27 DIAGNOSIS — M9902 Segmental and somatic dysfunction of thoracic region: Secondary | ICD-10-CM | POA: Diagnosis not present

## 2019-07-29 ENCOUNTER — Encounter: Payer: Self-pay | Admitting: Cardiology

## 2019-07-29 NOTE — Progress Notes (Signed)
Remote pacemaker transmission.   

## 2019-07-30 ENCOUNTER — Non-Acute Institutional Stay (SKILLED_NURSING_FACILITY): Payer: Medicare Other | Admitting: Adult Health

## 2019-07-30 ENCOUNTER — Encounter: Payer: Self-pay | Admitting: Adult Health

## 2019-07-30 DIAGNOSIS — G8929 Other chronic pain: Secondary | ICD-10-CM | POA: Diagnosis not present

## 2019-07-30 DIAGNOSIS — M545 Low back pain, unspecified: Secondary | ICD-10-CM

## 2019-07-30 DIAGNOSIS — G588 Other specified mononeuropathies: Secondary | ICD-10-CM | POA: Diagnosis not present

## 2019-07-30 DIAGNOSIS — I639 Cerebral infarction, unspecified: Secondary | ICD-10-CM | POA: Diagnosis not present

## 2019-07-30 NOTE — Progress Notes (Signed)
Location:   [facility] Room Number: 1 W Place of Service:  SNF (31)   CODE STATUS: DNR  Allergies  Allergen Reactions   Sulfur Swelling and Rash    Chief Complaint  Patient presents with   Medical Management of Chronic Issues       Chronic bilateral low back pain without sciatica :Peripheral neuropathy: CVA: (cerebrovascular accident)     HPI:  She is a 85 year old long term resident of this facility being seen for the management of her chronic illnesses: back pain; peripheral neuropathy; cva. She denies any uncontrolled pain; no changes in her appetite; no cough or shortness of breath.   Past Medical History:  Diagnosis Date   Atrial fibrillation ([facility])    Bradycardia    Breast nodule 11/15/2014   Breast pain, left 11/15/2014   Carotid artery disease ([facility])    Dizziness    Dyspnea    Previous CPX suggesting possible restrictive physiology, respiratory muscle fatigue, diastolic dysfunction   Essential hypertension, benign    Hyperlipidemia    Oxygen dependent    2 liter   Pacemaker [employer]    Rib pain on left side 11/15/2014   Seasonal allergies    Shingles 05/04/2015   Stroke ([place])    Toe fracture, left    left big toe    Past Surgical History:  Procedure Laterality Date   COLONOSCOPY  02/22/2012   Procedure: COLONOSCOPY;  Surgeon: Rogene Houston, MD;  [facility];  Service: Endoscopy;  Laterality: N/A;  100   KNEE SURGERY     bilateral   PACEMAKER IMPLANT N/A 07/17/2017   Procedure: Pacemaker Implant;  Surgeon: Deboraha Sprang, MD;  Location: [facility];  Service: Cardiovascular;  Laterality: N/A;    Social History   Socioeconomic History   Marital status: Widowed    Spouse name: Not on file   Number of children: Not on file   Years of education: Not on file   Highest education level: Not on file  Occupational History   Not on file  Social Needs   Financial resource strain: Not  hard at all   Food insecurity    Worry: Never true    Inability: Never true   Transportation needs    Medical: No    Non-medical: No  Tobacco Use   Smoking status: Never Smoker   Smokeless tobacco: Never Used  Substance and Sexual Activity   Alcohol use: No   Drug use: No   Sexual activity: Never    Birth control/protection: Post-menopausal  Lifestyle   Physical activity    Days per week: 0 days    Minutes per session: 0 min   Stress: Not at all  Relationships   Social connections    Talks on phone: Three times a week    Gets together: Twice a week    Attends religious service: Never    Active member of club or organization: No    Attends meetings of clubs or organizations: Never    Relationship status: Widowed   Intimate partner violence    Fear of current or ex partner: No    Emotionally abused: No    Physically abused: No    Forced sexual activity: No  Other Topics Concern   Not on file  Social History Narrative   Not on file   Family History  Problem Relation Age of Onset   Stroke Mother  Pneumonia Father    Heart disease Father    Healthy Son    Glaucoma Daughter    Emphysema Daughter    Healthy Daughter    Other Daughter        had knee replacement   Healthy Son    Heart disease Maternal Grandmother       VITAL SIGNS BP (!) 164/67    Pulse (!) 56    Temp 98.3 F (36.8 C)    Resp 17    Ht 5\' 4"  (1.626 m)    Wt 138 lb 9.6 oz (62.9 kg)    SpO2 98%    BMI 23.79 kg/m   Outpatient Encounter Medications as of 07/30/2019  Medication Sig   acetaminophen (TYLENOL) 500 MG tablet Take 1,000 mg by mouth 2 (two) times daily. Max of 3 grams acetaminophen per 24 hrs from all sources.   albuterol (PROVENTIL HFA;VENTOLIN HFA) 108 (90 Base) MCG/ACT inhaler Inhale 2 puffs into the lungs at bedtime. For SOB or wheezing   ANORO ELLIPTA 62.5-25 MCG/INH AEPB Inhale 1 puff into the lungs daily.    apixaban (ELIQUIS) 2.5 MG TABS tablet Take 1  tablet (2.5 mg total) by mouth 2 (two) times daily. Resume 07/20/17   artificial tears (LACRILUBE) OINT ophthalmic ointment Place 1 application into both eyes at bedtime.   cycloSPORINE (RESTASIS) 0.05 % ophthalmic emulsion Place 1 drop into both eyes every 12 (twelve) hours.    diltiazem (CARDIZEM CD) 180 MG 24 hr capsule TAKE ONE CAPSULE BY MOUTH ONCE DAILY.   furosemide (LASIX) 20 MG tablet Take 20 mg by mouth daily.    gabapentin (NEURONTIN) 100 MG capsule Take 100 mg by mouth 2 (two) times daily.   ipratropium-albuterol (DUONEB) 0.5-2.5 (3) MG/3ML SOLN Take 3 mLs by nebulization every 6 (six) hours as needed. Starting on 01/25/2019  For wheezing or SOB   Melatonin 5 MG TABS Take 5 mg by mouth at bedtime as needed.   Menthol, Topical Analgesic, (BIOFREEZE) 4 % GEL Apply topically to left shoulder and neck for M/S pain   Multiple Vitamin (MULITIVITAMIN WITH MINERALS) TABS Take 1 tablet by mouth every other day.    NON FORMULARY Diet Type:  Regular   Nutritional Supplements (ENSURE ENLIVE PO) Take 1 Bottle by mouth daily.    OXYGEN Inhale 2 L into the lungs 2 (two) times daily. 3:15pm to 11:15pm 11:15pm to 07:15am   pantoprazole (PROTONIX) 40 MG tablet Take 40 mg by mouth 2 (two) times daily.    Polyethyl Glycol-Propyl Glycol (SYSTANE OP) Place 1 drop into both eyes 3 (three) times daily. For dry eyes   polyethylene glycol (MIRALAX / GLYCOLAX) packet Take 17 g by mouth daily as needed. For constipation   traMADol (ULTRAM) 50 MG tablet Take 1 tablet (50 mg total) by mouth 3 (three) times daily.   [DISCONTINUED] Albuterol (VENTOLIN IN) Inhale into the lungs.     No facility-administered encounter medications on file as of 07/30/2019.      SIGNIFICANT DIAGNOSTIC EXAMS  LABS REVIEWED: PREVIOUS   12-07-17: chol 113; ldl 48  trig 47 hdl 56  01-26-19: wbc 11.2; hgb  13.3; hct 43.2; mcv 89.1 plt 307; glucose 83; bun 34; creat 1.00; k+ 4.1; na++ 142; ca 8.7  02-21-19: glucose 83;  bun 34; creat 1.09 k+ 4.0; na++ 141 ca 8.7 03-20-19: glucose 102; bun 17; creat 0.89;  k+ 3.5; na++142 ca 8.7 04-09-19: glucose 94; bun 26; creat 0.95; k+ 3.6; na++ 143; ca 8.5  04-30-19: wbc 8.8 hgb 14.2; hct 4.2; mcv 87.0; plt 205; glucose 93; bun 27; creat 1.08; k+ 3.5; na++ 140; ca 9.0 total bili 0.3; albumin 4.2 urine culture: e-coli: macrobid   NO NEW LABS.     Review of Systems  Constitutional: Negative for malaise/fatigue.  Respiratory: Negative for cough and shortness of breath.   Cardiovascular: Negative for chest pain, palpitations and leg swelling.  Gastrointestinal: Negative for abdominal pain, constipation and heartburn.  Musculoskeletal: Negative for back pain, joint pain and myalgias.  Skin: Negative.   Neurological: Negative for dizziness.  Psychiatric/Behavioral: The patient is not nervous/anxious.     Physical Exam Constitutional:      General: She is not in acute distress.    Appearance: She is well-developed. She is not diaphoretic.  Neck:     Musculoskeletal: Neck supple.     Thyroid: No thyromegaly.  Cardiovascular:     Rate and Rhythm: Normal rate and regular rhythm.     Pulses: Normal pulses.     Heart sounds: Normal heart sounds.     Comments: Pace maker  Pulmonary:     Effort: Pulmonary effort is normal. No respiratory distress.     Breath sounds: Normal breath sounds.  Abdominal:     General: Bowel sounds are normal. There is no distension.     Palpations: Abdomen is soft.     Tenderness: There is no abdominal tenderness.  Musculoskeletal:     Right lower leg: No edema.     Left lower leg: No edema.     Comments: Is able to move all extremities Has trace bilateral lower extremity edema  Wears ted hose  Lymphadenopathy:     Cervical: No cervical adenopathy.  Skin:    General: Skin is warm and dry.  Neurological:     Mental Status: She is alert. Mental status is at baseline.  Psychiatric:        Mood and Affect: Mood normal.      ASSESSMENT/ PLAN:  TODAY;   1. Chronic bilateral low back pain without sciatica : is stable will continue tylenol 1 gm twice daily and ultram 50 mg three times daily   2. Peripheral neuropathy: is stable will continue neurontin 100 mg twice daily  3. CVA: (cerebrovascular accident) is stable will continue eliquis 2.5 mg twice daily   PREVIOUS   4. Dyslipidemia: is stable is off lipitor will monitor   5. Complete heart block: is status post [employer] PTVDP pace maker will monitor    6. Chronic diastolic heart failure: EF 60-65% (06-21-17) is stable will continue lasix 20 mg daily   7. Weight loss: is without change on 03-06-19: 153 pounds;  05-25-19 weight 137 pounds her current weight is 138 pounds will continue supplements as directed and will monitor her status   8. Longstanding persistent atrial fibrillation: is status post pace maker: will continue cardizem cd 180 mg daily for rate control will continue eliquis 2.5 mg twice daily   9. Hypertensive heart and kidney disease with chronic diastolic congestive heart failure and stage 3 chronic kidney disease: is stable b/p 164/67 will continue cardizem cd 180 mg daily is off norvasc . Will monitor   10. Chronic obstructive pulmonary disease: is stable is 02 dependent will continue anoro 62.5/25 mcg 1 puff daily albuterol 2 puffs nighty has duoneb every 6 hours as needed.   11. Chronic kidney disease stage 3 (moderate) is stable bun 27; creat 1.08 will monitor   12. GERD without  esophagitis: is stable will continue protonix 40 mg twice daily   13. Chronic constipation: is stable will continue miralax daily as needed     MD is aware of resident's narcotic use and is in agreement with current plan of care. We will attempt to wean resident as apropriate   Ok Edwards NP [facility]  Contact [phone] Monday through Friday 8am- 5pm  After hours call [phone]

## 2019-08-05 DIAGNOSIS — J449 Chronic obstructive pulmonary disease, unspecified: Secondary | ICD-10-CM | POA: Diagnosis not present

## 2019-08-05 DIAGNOSIS — I5032 Chronic diastolic (congestive) heart failure: Secondary | ICD-10-CM | POA: Diagnosis not present

## 2019-08-05 DIAGNOSIS — M6281 Muscle weakness (generalized): Secondary | ICD-10-CM | POA: Diagnosis not present

## 2019-08-05 DIAGNOSIS — Z993 Dependence on wheelchair: Secondary | ICD-10-CM | POA: Diagnosis not present

## 2019-08-05 DIAGNOSIS — M1990 Unspecified osteoarthritis, unspecified site: Secondary | ICD-10-CM | POA: Diagnosis not present

## 2019-08-05 DIAGNOSIS — R279 Unspecified lack of coordination: Secondary | ICD-10-CM | POA: Diagnosis not present

## 2019-08-06 ENCOUNTER — Encounter: Payer: Self-pay | Admitting: Adult Health

## 2019-08-06 ENCOUNTER — Non-Acute Institutional Stay (SKILLED_NURSING_FACILITY): Payer: Medicare Other | Admitting: Adult Health

## 2019-08-06 DIAGNOSIS — R279 Unspecified lack of coordination: Secondary | ICD-10-CM | POA: Diagnosis not present

## 2019-08-06 DIAGNOSIS — I5032 Chronic diastolic (congestive) heart failure: Secondary | ICD-10-CM | POA: Diagnosis not present

## 2019-08-06 DIAGNOSIS — M25512 Pain in left shoulder: Secondary | ICD-10-CM

## 2019-08-06 DIAGNOSIS — Z993 Dependence on wheelchair: Secondary | ICD-10-CM | POA: Diagnosis not present

## 2019-08-06 DIAGNOSIS — M1990 Unspecified osteoarthritis, unspecified site: Secondary | ICD-10-CM | POA: Diagnosis not present

## 2019-08-06 DIAGNOSIS — M6281 Muscle weakness (generalized): Secondary | ICD-10-CM | POA: Diagnosis not present

## 2019-08-06 DIAGNOSIS — J449 Chronic obstructive pulmonary disease, unspecified: Secondary | ICD-10-CM | POA: Diagnosis not present

## 2019-08-06 NOTE — Progress Notes (Signed)
Location:  [place] Room Number: 108-W Place of Service:  SNF (31)   CODE STATUS: DNR  Allergies  Allergen Reactions  . Sulfur Swelling and Rash    Chief Complaint  Patient presents with  . Acute Visit    Patient is seen for left shoulder pain.    HPI:  Staff report that she is having left shoulder pain. Therapy reports that she has limited range of motion with her left shoulder. She is able to reach her nose but unable to reach the top of her head. She is able to feed herself.    Past Medical History:  Diagnosis Date  . Atrial fibrillation ([place])   . Bradycardia   . Breast nodule 11/15/2014  . Breast pain, left 11/15/2014  . Carotid artery disease ([place])   . Dizziness   . Dyspnea    Previous CPX suggesting possible restrictive physiology, respiratory muscle fatigue, diastolic dysfunction  . Essential hypertension, benign   . Hyperlipidemia   . Oxygen dependent    2 liter  . Pacemaker [place]   . Rib pain on left side 11/15/2014  . Seasonal allergies   . Shingles 05/04/2015  . Stroke ([place])   . Toe fracture, left    left big toe    Past Surgical History:  Procedure Laterality Date  . COLONOSCOPY  02/22/2012   Procedure: COLONOSCOPY;  Surgeon: Rogene Houston, MD;  [facility];  Service: Endoscopy;  Laterality: N/A;  100  . KNEE SURGERY     bilateral  . PACEMAKER IMPLANT N/A 07/17/2017   Procedure: Pacemaker Implant;  Surgeon: Deboraha Sprang, MD;  Location: [place] [facility] LAB;  Service: Cardiovascular;  Laterality: N/A;    Social History   Socioeconomic History  . Marital status: Widowed    Spouse name: Not on file  . Number of children: Not on file  . Years of education: Not on file  . Highest education level: Not on file  Occupational History  . Not on file  Social Needs  . Financial resource strain: Not hard at all  . Food insecurity    Worry: Never true    Inability: Never true  . Transportation needs    Medical:  No    Non-medical: No  Tobacco Use  . Smoking status: Never Smoker  . Smokeless tobacco: Never Used  Substance and Sexual Activity  . Alcohol use: No  . Drug use: No  . Sexual activity: Never    Birth control/protection: Post-menopausal  Lifestyle  . Physical activity    Days per week: 0 days    Minutes per session: 0 min  . Stress: Not at all  Relationships  . Social Herbalist on phone: Three times a week    Gets together: Twice a week    Attends religious service: Never    Active member of club or organization: No    Attends meetings of clubs or organizations: Never    Relationship status: Widowed  . Intimate partner violence    Fear of current or ex partner: No    Emotionally abused: No    Physically abused: No    Forced sexual activity: No  Other Topics Concern  . Not on file  Social History Narrative  . Not on file   Family History  Problem Relation Age of Onset  . Stroke Mother   . Pneumonia Father   . Heart disease Father   . Healthy  Son   . Glaucoma Daughter   . Emphysema Daughter   . Healthy Daughter   . Other Daughter        had knee replacement  . Healthy Son   . Heart disease Maternal Grandmother       VITAL SIGNS BP (!) 149/80   Pulse 60   Temp 98.5 F (36.9 C) (Oral)   Resp 20   Ht 5\' 4"  (1.626 m)   Wt 140 lb 3.2 oz (63.6 kg)   SpO2 100% Comment: On 2L  BMI 24.07 kg/m   Outpatient Encounter Medications as of 08/06/2019  Medication Sig  . acetaminophen (TYLENOL) 500 MG tablet Take 1,000 mg by mouth 2 (two) times daily. Max of 3 grams acetaminophen per 24 hrs from all sources.  Marland Kitchen albuterol (PROVENTIL HFA;VENTOLIN HFA) 108 (90 Base) MCG/ACT inhaler Inhale 2 puffs into the lungs at bedtime. For SOB or wheezing  . ANORO ELLIPTA 62.5-25 MCG/INH AEPB Inhale 1 puff into the lungs daily.   Marland Kitchen apixaban (ELIQUIS) 2.5 MG TABS tablet Take 1 tablet (2.5 mg total) by mouth 2 (two) times daily. Resume 07/20/17  . artificial tears (LACRILUBE)  OINT ophthalmic ointment Place 1 application into both eyes at bedtime.  . cycloSPORINE (RESTASIS) 0.05 % ophthalmic emulsion Place 1 drop into both eyes every 12 (twelve) hours.   Marland Kitchen diltiazem (CARDIZEM CD) 180 MG 24 hr capsule TAKE ONE CAPSULE BY MOUTH ONCE DAILY.  . furosemide (LASIX) 20 MG tablet Take 20 mg by mouth daily.   Marland Kitchen gabapentin (NEURONTIN) 100 MG capsule Take 100 mg by mouth 2 (two) times daily.   Marland Kitchen ipratropium-albuterol (DUONEB) 0.5-2.5 (3) MG/3ML SOLN Take 3 mLs by nebulization every 6 (six) hours as needed. Starting on 01/25/2019  For wheezing or SOB  . Melatonin 5 MG TABS Take 5 mg by mouth at bedtime as needed.   . Menthol, Topical Analgesic, (BIOFREEZE) 4 % GEL Apply 1 application topically 2 (two) times daily. Apply topically to left shoulder and neck for M/S pain   . Multiple Vitamin (MULITIVITAMIN WITH MINERALS) TABS Take 1 tablet by mouth every other day.   . NON FORMULARY Diet Type:  Regular  . Nutritional Supplements (ENSURE ENLIVE PO) Take 237 mLs by mouth daily.   . OXYGEN Inhale 2 L into the lungs 2 (two) times daily. 3:15pm to 11:15pm 11:15pm to 07:15am  . pantoprazole (PROTONIX) 40 MG tablet Take 40 mg by mouth 2 (two) times daily.   Vladimir Faster Glycol-Propyl Glycol (SYSTANE OP) Place 1 drop into both eyes 3 (three) times daily. For dry eyes  . polyethylene glycol (MIRALAX / GLYCOLAX) packet Take 17 g by mouth daily as needed. For constipation  . traMADol (ULTRAM) 50 MG tablet Take 1 tablet (50 mg total) by mouth 3 (three) times daily.  . [DISCONTINUED] Albuterol (VENTOLIN IN) Inhale into the lungs.     No facility-administered encounter medications on file as of 08/06/2019.      SIGNIFICANT DIAGNOSTIC EXAMS   LABS REVIEWED: PREVIOUS   12-07-17: chol 113; ldl 48  trig 47 hdl 56  01-26-19: wbc 11.2; hgb  13.3; hct 43.2; mcv 89.1 plt 307; glucose 83; bun 34; creat 1.00; k+ 4.1; na++ 142; ca 8.7  02-21-19: glucose 83; bun 34; creat 1.09 k+ 4.0; na++ 141 ca 8.7  03-20-19: glucose 102; bun 17; creat 0.89;  k+ 3.5; na++142 ca 8.7 04-09-19: glucose 94; bun 26; creat 0.95; k+ 3.6; na++ 143; ca 8.5  04-30-19: wbc 8.8 hgb  14.2; hct 4.2; mcv 87.0; plt 205; glucose 93; bun 27; creat 1.08; k+ 3.5; na++ 140; ca 9.0 total bili 0.3; albumin 4.2 urine culture: e-coli: macrobid   TODAY;   07-02-19: glucose 91; bun 29; creat 0.96; k+ 4.3; na++ 141; ca 8.7   Review of Systems  Constitutional: Negative for malaise/fatigue.  Respiratory: Negative for cough and shortness of breath.   Cardiovascular: Negative for chest pain, palpitations and leg swelling.  Gastrointestinal: Negative for abdominal pain, constipation and heartburn.  Musculoskeletal: Positive for joint pain. Negative for back pain and myalgias.       Left shoulder pain and reduced range of motion  Skin: Negative.   Neurological: Negative for dizziness.  Psychiatric/Behavioral: The patient is not nervous/anxious.     Physical Exam Constitutional:      General: She is not in acute distress.    Appearance: She is well-developed. She is not diaphoretic.  Neck:     Musculoskeletal: Neck supple.     Thyroid: No thyromegaly.  Cardiovascular:     Rate and Rhythm: Normal rate and regular rhythm.     Pulses: Normal pulses.     Heart sounds: Normal heart sounds.     Comments: Pace maker  Pulmonary:     Effort: Pulmonary effort is normal. No respiratory distress.     Breath sounds: Normal breath sounds.  Abdominal:     General: Bowel sounds are normal. There is no distension.     Palpations: Abdomen is soft.     Tenderness: There is no abdominal tenderness.  Musculoskeletal:     Right lower leg: No edema.     Left lower leg: No edema.     Comments: Is able to move all extremities Has limited range of motion in left shoulder.   Lymphadenopathy:     Cervical: No cervical adenopathy.  Skin:    General: Skin is warm and dry.  Neurological:     Mental Status: She is alert. Mental status is at  baseline.  Psychiatric:        Mood and Affect: Mood normal.      ASSESSMENT/ PLAN:  TODAY;   1. Acute left shoulder pain: is worse: will have therapy to evaluate and treat as indicated for range of motion and pain management will monitor her status.     MD is aware of resident's narcotic use and is in agreement with current plan of care. We will attempt to wean resident as appropriate.  Ok Edwards NP [facility]  Contact [phone] Monday through Friday 8am- 5pm  After hours call [phone]

## 2019-08-07 DIAGNOSIS — J449 Chronic obstructive pulmonary disease, unspecified: Secondary | ICD-10-CM | POA: Diagnosis not present

## 2019-08-07 DIAGNOSIS — R279 Unspecified lack of coordination: Secondary | ICD-10-CM | POA: Diagnosis not present

## 2019-08-07 DIAGNOSIS — I5032 Chronic diastolic (congestive) heart failure: Secondary | ICD-10-CM | POA: Diagnosis not present

## 2019-08-07 DIAGNOSIS — M1990 Unspecified osteoarthritis, unspecified site: Secondary | ICD-10-CM | POA: Diagnosis not present

## 2019-08-07 DIAGNOSIS — Z993 Dependence on wheelchair: Secondary | ICD-10-CM | POA: Diagnosis not present

## 2019-08-07 DIAGNOSIS — M6281 Muscle weakness (generalized): Secondary | ICD-10-CM | POA: Diagnosis not present

## 2019-08-09 DIAGNOSIS — M25512 Pain in left shoulder: Secondary | ICD-10-CM | POA: Insufficient documentation

## 2019-08-10 DIAGNOSIS — M1990 Unspecified osteoarthritis, unspecified site: Secondary | ICD-10-CM | POA: Diagnosis not present

## 2019-08-10 DIAGNOSIS — I5032 Chronic diastolic (congestive) heart failure: Secondary | ICD-10-CM | POA: Diagnosis not present

## 2019-08-10 DIAGNOSIS — R279 Unspecified lack of coordination: Secondary | ICD-10-CM | POA: Diagnosis not present

## 2019-08-10 DIAGNOSIS — Z993 Dependence on wheelchair: Secondary | ICD-10-CM | POA: Diagnosis not present

## 2019-08-10 DIAGNOSIS — M6281 Muscle weakness (generalized): Secondary | ICD-10-CM | POA: Diagnosis not present

## 2019-08-10 DIAGNOSIS — J449 Chronic obstructive pulmonary disease, unspecified: Secondary | ICD-10-CM | POA: Diagnosis not present

## 2019-08-11 DIAGNOSIS — M1990 Unspecified osteoarthritis, unspecified site: Secondary | ICD-10-CM | POA: Diagnosis not present

## 2019-08-11 DIAGNOSIS — Z993 Dependence on wheelchair: Secondary | ICD-10-CM | POA: Diagnosis not present

## 2019-08-11 DIAGNOSIS — M6281 Muscle weakness (generalized): Secondary | ICD-10-CM | POA: Diagnosis not present

## 2019-08-11 DIAGNOSIS — R279 Unspecified lack of coordination: Secondary | ICD-10-CM | POA: Diagnosis not present

## 2019-08-11 DIAGNOSIS — I5032 Chronic diastolic (congestive) heart failure: Secondary | ICD-10-CM | POA: Diagnosis not present

## 2019-08-11 DIAGNOSIS — J449 Chronic obstructive pulmonary disease, unspecified: Secondary | ICD-10-CM | POA: Diagnosis not present

## 2019-08-12 DIAGNOSIS — M6281 Muscle weakness (generalized): Secondary | ICD-10-CM | POA: Diagnosis not present

## 2019-08-12 DIAGNOSIS — M1990 Unspecified osteoarthritis, unspecified site: Secondary | ICD-10-CM | POA: Diagnosis not present

## 2019-08-12 DIAGNOSIS — J449 Chronic obstructive pulmonary disease, unspecified: Secondary | ICD-10-CM | POA: Diagnosis not present

## 2019-08-12 DIAGNOSIS — Z993 Dependence on wheelchair: Secondary | ICD-10-CM | POA: Diagnosis not present

## 2019-08-12 DIAGNOSIS — R279 Unspecified lack of coordination: Secondary | ICD-10-CM | POA: Diagnosis not present

## 2019-08-12 DIAGNOSIS — I5032 Chronic diastolic (congestive) heart failure: Secondary | ICD-10-CM | POA: Diagnosis not present

## 2019-08-14 DIAGNOSIS — I5032 Chronic diastolic (congestive) heart failure: Secondary | ICD-10-CM | POA: Diagnosis not present

## 2019-08-14 DIAGNOSIS — R279 Unspecified lack of coordination: Secondary | ICD-10-CM | POA: Diagnosis not present

## 2019-08-14 DIAGNOSIS — Z993 Dependence on wheelchair: Secondary | ICD-10-CM | POA: Diagnosis not present

## 2019-08-14 DIAGNOSIS — J449 Chronic obstructive pulmonary disease, unspecified: Secondary | ICD-10-CM | POA: Diagnosis not present

## 2019-08-14 DIAGNOSIS — M1990 Unspecified osteoarthritis, unspecified site: Secondary | ICD-10-CM | POA: Diagnosis not present

## 2019-08-14 DIAGNOSIS — M6281 Muscle weakness (generalized): Secondary | ICD-10-CM | POA: Diagnosis not present

## 2019-08-17 DIAGNOSIS — M6281 Muscle weakness (generalized): Secondary | ICD-10-CM | POA: Diagnosis not present

## 2019-08-17 DIAGNOSIS — I5032 Chronic diastolic (congestive) heart failure: Secondary | ICD-10-CM | POA: Diagnosis not present

## 2019-08-17 DIAGNOSIS — Z993 Dependence on wheelchair: Secondary | ICD-10-CM | POA: Diagnosis not present

## 2019-08-17 DIAGNOSIS — M1990 Unspecified osteoarthritis, unspecified site: Secondary | ICD-10-CM | POA: Diagnosis not present

## 2019-08-17 DIAGNOSIS — J449 Chronic obstructive pulmonary disease, unspecified: Secondary | ICD-10-CM | POA: Diagnosis not present

## 2019-08-17 DIAGNOSIS — R279 Unspecified lack of coordination: Secondary | ICD-10-CM | POA: Diagnosis not present

## 2019-09-01 ENCOUNTER — Ambulatory Visit (INDEPENDENT_AMBULATORY_CARE_PROVIDER_SITE_OTHER): Payer: Medicare Other | Admitting: Internal Medicine

## 2019-09-01 ENCOUNTER — Encounter: Payer: Self-pay | Admitting: Internal Medicine

## 2019-09-01 DIAGNOSIS — I442 Atrioventricular block, complete: Secondary | ICD-10-CM | POA: Diagnosis not present

## 2019-09-01 LAB — CUP PACEART INCLINIC DEVICE CHECK
Date Time Interrogation Session: 20200922095838
Implantable Lead Implant Date: 20180808
Implantable Lead Location: 753860
Implantable Lead Model: 1948
Implantable Pulse Generator Implant Date: 20180808
Pulse Gen Model: 1272
Pulse Gen Serial Number: 7957820

## 2019-09-01 NOTE — Progress Notes (Signed)
HPI Wendy Perkins returns today for followup of her PPM. SHe has a h/o CHB, HTN , and dyslipidemia. She has had a stroke in the interim with minimal residual. She has dyspnea with exertion but has become fairly sedentary. She has mild peripheral edema.  Allergies  Allergen Reactions  . Sulfur Swelling and Rash     Current Outpatient Medications  Medication Sig Dispense Refill  . traMADol (ULTRAM) 50 MG tablet Take 1 tablet (50 mg total) by mouth 3 (three) times daily. 90 tablet 0   No current facility-administered medications for this visit.      Past Medical History:  Diagnosis Date  . Atrial fibrillation ([place])   . Bradycardia   . Breast nodule 11/15/2014  . Breast pain, left 11/15/2014  . Carotid artery disease ([place])   . Dizziness   . Dyspnea    Previous CPX suggesting possible restrictive physiology, respiratory muscle fatigue, diastolic dysfunction  . Essential hypertension, benign   . Hyperlipidemia   . Oxygen dependent    2 liter  . Pacemaker [place]   . Rib pain on left side 11/15/2014  . Seasonal allergies   . Shingles 05/04/2015  . Stroke ([place])   . Toe fracture, left    left big toe    ROS:   All systems reviewed and negative except as noted in the HPI.   Past Surgical History:  Procedure Laterality Date  . COLONOSCOPY  02/22/2012   Procedure: COLONOSCOPY;  Surgeon: Rogene Houston, MD;  [facility];  Service: Endoscopy;  Laterality: N/A;  100  . KNEE SURGERY     bilateral  . PACEMAKER IMPLANT N/A 07/17/2017   Procedure: Pacemaker Implant;  Surgeon: Deboraha Sprang, MD;  Location: [place] [facility] LAB;  Service: Cardiovascular;  Laterality: N/A;     Family History  Problem Relation Age of Onset  . Stroke Mother   . Pneumonia Father   . Heart disease Father   . Healthy Son   . Glaucoma Daughter   . Emphysema Daughter   . Healthy Daughter   . Other Daughter        had knee replacement  . Healthy Son   . Heart disease Maternal  Grandmother      Social History   Socioeconomic History  . Marital status: Widowed    Spouse name: Not on file  . Number of children: Not on file  . Years of education: Not on file  . Highest education level: Not on file  Occupational History  . Not on file  Social Needs  . Financial resource strain: Not hard at all  . Food insecurity    Worry: Never true    Inability: Never true  . Transportation needs    Medical: No    Non-medical: No  Tobacco Use  . Smoking status: Never Smoker  . Smokeless tobacco: Never Used  Substance and Sexual Activity  . Alcohol use: No  . Drug use: No  . Sexual activity: Never    Birth control/protection: Post-menopausal  Lifestyle  . Physical activity    Days per week: 0 days    Minutes per session: 0 min  . Stress: Not at all  Relationships  . Social Herbalist on phone: Three times a week    Gets together: Twice a week    Attends religious service: Never    Active member of club or organization: No    Attends meetings  of clubs or organizations: Never    Relationship status: Widowed  . Intimate partner violence    Fear of current or ex partner: No    Emotionally abused: No    Physically abused: No    Forced sexual activity: No  Other Topics Concern  . Not on file  Social History Narrative  . Not on file     BP (!) 160/71   Pulse 68   Temp 98.6 F (37 C)   Ht 5\' 6"  (1.676 m)   Wt 144 lb (65.3 kg)   SpO2 98%   BMI 23.24 kg/m   Physical Exam:  stable appearing elderly woman, NAD HEENT: Unremarkable Neck:  6 cm JVD, no thyromegally Lymphatics:  No adenopathy Back:  No CVA tenderness Lungs:  Clear with no wheezes HEART:  Regular rate rhythm, no murmurs, no rubs, no clicks Abd:  soft, positive bowel sounds, no organomegally, no rebound, no guarding Ext:  2 plus pulses, no edema, no cyanosis, no clubbing Skin:  No rashes no nodules Neuro:  CN II through XII intact, motor grossly intact  DEVICE  Normal  device function.  See PaceArt for details.   Assess/Plan: 1. Atrial fib - her VR is well controlled. We will continue her current meds. 2. PPM - her St. Jude VVI PPM is working normally.  3. HTN - her SBP is elevated. I am reluctant to uptitrate her meds as I am concerned about hypotension and falls.   Mikle Bosworth.D.

## 2019-09-01 NOTE — Patient Instructions (Signed)
Medication Instructions:  Your physician recommends that you continue on your current medications as directed. Please refer to the Current Medication list given to you today.  If you need a refill on your cardiac medications before your next appointment, please call your pharmacy.   Lab work: NONE  If you have labs (blood work) drawn today and your tests are completely normal, you will receive your results only by: . MyChart Message (if you have MyChart) OR . A paper copy in the mail If you have any lab test that is abnormal or we need to change your treatment, we will call you to review the results.  Testing/Procedures: NONE   Follow-Up: At [facility], you and your health needs are our priority.  As part of our continuing mission to provide you with exceptional heart care, we have created designated Provider Care Teams.  These Care Teams include your primary Cardiologist (physician) and Advanced Practice Providers (APPs -  Physician Assistants and Nurse Practitioners) who all work together to provide you with the care you need, when you need it. You will need a follow up appointment in 1 years.  Please call our office 2 months in advance to schedule this appointment.  You may see None or one of the following Advanced Practice Providers on your designated Care Team:   Amber Seiler, NP . Renee Ursuy, PA-C  Any Other Special Instructions Will Be Listed Below (If Applicable). Thank you for choosing [facility]!     

## 2019-09-03 ENCOUNTER — Other Ambulatory Visit: Payer: Self-pay | Admitting: Adult Health

## 2019-09-03 MED ORDER — TRAMADOL HCL 50 MG PO TABS: 50.0000 mg | ORAL_TABLET | Freq: Three times a day (TID) | ORAL | 0 refills | Status: DC

## 2019-09-09 ENCOUNTER — Encounter: Payer: Self-pay | Admitting: Adult Health

## 2019-09-09 ENCOUNTER — Non-Acute Institutional Stay (SKILLED_NURSING_FACILITY): Payer: Medicare Other | Admitting: Adult Health

## 2019-09-09 DIAGNOSIS — I442 Atrioventricular block, complete: Secondary | ICD-10-CM | POA: Diagnosis not present

## 2019-09-09 DIAGNOSIS — I5032 Chronic diastolic (congestive) heart failure: Secondary | ICD-10-CM | POA: Diagnosis not present

## 2019-09-09 DIAGNOSIS — I4811 Longstanding persistent atrial fibrillation: Secondary | ICD-10-CM | POA: Diagnosis not present

## 2019-09-09 NOTE — Progress Notes (Signed)
Location:    [place] Room Number: 108/W Place of Service:  SNF (31)   CODE STATUS: DNR  Allergies  Allergen Reactions  . Sulfur Swelling and Rash   Chief Complaint  Patient presents with  . Medical Management of Chronic Issues        Complete heart block: Chronic diastolic heart failure:  Longstanding persistent atrial fibrillation:    HPI:  She is a 85 year old long term resident of this facility being seen for the management of her chronic illnesses; heart block; afib; chf. She denies any cough; shortness of breath; no leg swelling; no palpitations.   Past Medical History:  Diagnosis Date  . Atrial fibrillation ([place])   . Bradycardia   . Breast nodule 11/15/2014  . Breast pain, left 11/15/2014  . Carotid artery disease ([place])   . Dizziness   . Dyspnea    Previous CPX suggesting possible restrictive physiology, respiratory muscle fatigue, diastolic dysfunction  . Essential hypertension, benign   . Hyperlipidemia   . Oxygen dependent    2 liter  . Pacemaker [place]   . Rib pain on left side 11/15/2014  . Seasonal allergies   . Shingles 05/04/2015  . Stroke ([place])   . Toe fracture, left    left big toe    Past Surgical History:  Procedure Laterality Date  . COLONOSCOPY  02/22/2012   Procedure: COLONOSCOPY;  Surgeon: Rogene Houston, MD;  [facility];  Service: Endoscopy;  Laterality: N/A;  100  . KNEE SURGERY     bilateral  . PACEMAKER IMPLANT N/A 07/17/2017   Procedure: Pacemaker Implant;  Surgeon: Deboraha Sprang, MD;  Location: [place] [facility] LAB;  Service: Cardiovascular;  Laterality: N/A;    Social History   Socioeconomic History  . Marital status: Widowed    Spouse name: Not on file  . Number of children: Not on file  . Years of education: Not on file  . Highest education level: Not on file  Occupational History  . Not on file  Social Needs  . Financial resource strain: Not hard at all  . Food insecurity    Worry:  Never true    Inability: Never true  . Transportation needs    Medical: No    Non-medical: No  Tobacco Use  . Smoking status: Never Smoker  . Smokeless tobacco: Never Used  Substance and Sexual Activity  . Alcohol use: No  . Drug use: No  . Sexual activity: Never    Birth control/protection: Post-menopausal  Lifestyle  . Physical activity    Days per week: 0 days    Minutes per session: 0 min  . Stress: Not at all  Relationships  . Social Herbalist on phone: Three times a week    Gets together: Twice a week    Attends religious service: Never    Active member of club or organization: No    Attends meetings of clubs or organizations: Never    Relationship status: Widowed  . Intimate partner violence    Fear of current or ex partner: No    Emotionally abused: No    Physically abused: No    Forced sexual activity: No  Other Topics Concern  . Not on file  Social History Narrative  . Not on file   Family History  Problem Relation Age of Onset  . Stroke Mother   . Pneumonia Father   . Heart disease  Father   . Healthy Son   . Glaucoma Daughter   . Emphysema Daughter   . Healthy Daughter   . Other Daughter        had knee replacement  . Healthy Son   . Heart disease Maternal Grandmother       VITAL SIGNS BP 122/67   Pulse (!) 59   Temp 98.4 F (36.9 C) (Oral)   Resp 18   Ht 5\' 4"  (1.626 m)   Wt 135 lb 3.2 oz (61.3 kg)   SpO2 95%   BMI 23.21 kg/m   Outpatient Encounter Medications as of 09/09/2019  Medication Sig  . acetaminophen (TYLENOL) 500 MG tablet Take 1,000 mg by mouth 2 (two) times daily. Max of 3 grams acetaminophen per 24 hrs from all sources.  Marland Kitchen albuterol (PROVENTIL HFA;VENTOLIN HFA) 108 (90 Base) MCG/ACT inhaler Inhale 2 puffs into the lungs at bedtime. For SOB or wheezing  . ANORO ELLIPTA 62.5-25 MCG/INH AEPB Inhale 1 puff into the lungs daily.   Marland Kitchen apixaban (ELIQUIS) 2.5 MG TABS tablet Take 1 tablet (2.5 mg total) by mouth 2 (two)  times daily. Resume 07/20/17  . artificial tears (LACRILUBE) OINT ophthalmic ointment Place 1 application into both eyes at bedtime.  . cycloSPORINE (RESTASIS) 0.05 % ophthalmic emulsion Place 1 drop into both eyes every 12 (twelve) hours.   Marland Kitchen diltiazem (CARDIZEM CD) 180 MG 24 hr capsule TAKE ONE CAPSULE BY MOUTH ONCE DAILY.  . furosemide (LASIX) 20 MG tablet Take 20 mg by mouth daily.   Marland Kitchen gabapentin (NEURONTIN) 100 MG capsule Take 100 mg by mouth 2 (two) times daily.   Marland Kitchen ipratropium-albuterol (DUONEB) 0.5-2.5 (3) MG/3ML SOLN Take 3 mLs by nebulization every 6 (six) hours as needed. Starting on 01/25/2019  For wheezing or SOB  . Melatonin 5 MG TABS Take 5 mg by mouth at bedtime as needed.   . Menthol, Topical Analgesic, (BIOFREEZE) 4 % GEL Apply 1 application topically 2 (two) times daily. Apply topically to left shoulder and neck for M/S pain   . Multiple Vitamin (MULITIVITAMIN WITH MINERALS) TABS Take 1 tablet by mouth every other day.   . NON FORMULARY Diet Type:  Regular  . Nutritional Supplements (ENSURE ENLIVE PO) Take 237 mLs by mouth daily.   . OXYGEN Inhale 2 L into the lungs 2 (two) times daily. 3:15pm to 11:15pm 11:15pm to 07:15am  . pantoprazole (PROTONIX) 40 MG tablet Take 40 mg by mouth 2 (two) times daily.   Vladimir Faster Glycol-Propyl Glycol (SYSTANE OP) Place 1 drop into both eyes 3 (three) times daily. For dry eyes  . polyethylene glycol (MIRALAX / GLYCOLAX) packet Take 17 g by mouth daily as needed. For constipation  . traMADol (ULTRAM) 50 MG tablet Take 1 tablet (50 mg total) by mouth 3 (three) times daily.  . [DISCONTINUED] Albuterol (VENTOLIN IN) Inhale into the lungs.     No facility-administered encounter medications on file as of 09/09/2019.      SIGNIFICANT DIAGNOSTIC EXAMS    LABS REVIEWED: PREVIOUS   12-07-17: chol 113; ldl 48  trig 47 hdl 56  01-26-19: wbc 11.2; hgb  13.3; hct 43.2; mcv 89.1 plt 307; glucose 83; bun 34; creat 1.00; k+ 4.1; na++ 142; ca 8.7   02-21-19: glucose 83; bun 34; creat 1.09 k+ 4.0; na++ 141 ca 8.7 03-20-19: glucose 102; bun 17; creat 0.89;  k+ 3.5; na++142 ca 8.7 04-09-19: glucose 94; bun 26; creat 0.95; k+ 3.6; na++ 143; ca 8.5  04-30-19: wbc 8.8 hgb 14.2; hct 4.2; mcv 87.0; plt 205; glucose 93; bun 27; creat 1.08; k+ 3.5; na++ 140; ca 9.0 total bili 0.3; albumin 4.2 urine culture: e-coli: macrobid  07-02-19: glucose 91; bun 29; creat 0.96; k+ 4.3; na++ 141; ca 8.7  NO NEW LABS.   Review of Systems  Constitutional: Negative for malaise/fatigue.  Respiratory: Negative for cough and shortness of breath.   Cardiovascular: Negative for chest pain, palpitations and leg swelling.  Gastrointestinal: Negative for abdominal pain, constipation and heartburn.  Musculoskeletal: Negative for back pain, joint pain and myalgias.  Skin: Negative.   Neurological: Negative for dizziness.  Psychiatric/Behavioral: The patient is not nervous/anxious.     Physical Exam Constitutional:      General: She is not in acute distress.    Appearance: She is well-developed. She is not diaphoretic.  Neck:     Musculoskeletal: Neck supple.     Thyroid: No thyromegaly.  Cardiovascular:     Rate and Rhythm: Normal rate and regular rhythm.     Pulses: Normal pulses.     Heart sounds: Normal heart sounds.     Comments: Pace maker  Pulmonary:     Effort: Pulmonary effort is normal. No respiratory distress.     Breath sounds: Normal breath sounds.  Abdominal:     General: Bowel sounds are normal. There is no distension.     Palpations: Abdomen is soft.     Tenderness: There is no abdominal tenderness.  Musculoskeletal:     Right lower leg: No edema.     Left lower leg: No edema.     Comments:  Is able to move all extremities Has limited range of motion in left shoulder.    Lymphadenopathy:     Cervical: No cervical adenopathy.  Skin:    General: Skin is warm and dry.  Neurological:     Mental Status: She is alert. Mental status is at  baseline.  Psychiatric:        Mood and Affect: Mood normal.     ASSESSMENT/ PLAN:  TODAY;   1. Complete heart block: is status post St. Jude PTV/DP pace maker will monitor  2. Chronic diastolic heart failure: EF 60-65 % (06-21-17): is stable will continue lasix 20 mg daily   3. Longstanding persistent atrial fibrillation: is status post pace make: will continue cardizem cd 180 mg daily for rate control will continue continue eliquis 2.5 mg twice daily    PREVIOUS   4. Dyslipidemia: is stable is off lipitor will monitor   5. Weight loss: is without change on 03-06-19: 153 pounds;  05-25-19 weight 137 pounds her current weight is 135 pounds will continue supplements as directed and will monitor her status   6. Hypertensive heart and kidney disease with chronic diastolic congestive heart failure and stage 3 chronic kidney disease: is stable b/p 122/67 will continue cardizem cd 180 mg daily is off norvasc . Will monitor   7. Chronic obstructive pulmonary disease: is stable is 02 dependent will continue anoro 62.5/25 mcg 1 puff daily albuterol 2 puffs nighty has duoneb every 6 hours as needed.   8. Chronic kidney disease stage 3 (moderate) is stable bun 27; creat 1.08 will monitor   9. GERD without esophagitis: is stable will continue protonix 40 mg twice daily   10. Chronic constipation: is stable will continue miralax daily as needed   11. Chronic bilateral low back pain without sciatica : is stable will continue tylenol 1 gm twice  daily and ultram 50 mg three times daily   12. Peripheral neuropathy: is stable will continue neurontin 100 mg twice daily  13. CVA: (cerebrovascular accident) is stable will continue eliquis 2.5 mg twice daily      MD is aware of resident's narcotic use and is in agreement with current plan of care. We will attempt to wean resident as appropriate.  Ok Edwards NP [facility]  Contact [phone] Monday through Friday 8am- 5pm   After hours call [phone]

## 2019-09-30 ENCOUNTER — Other Ambulatory Visit: Payer: Self-pay | Admitting: Adult Health

## 2019-10-01 ENCOUNTER — Other Ambulatory Visit: Payer: Self-pay | Admitting: Adult Health

## 2019-10-01 MED ORDER — TRAMADOL HCL 50 MG PO TABS: 50.0000 mg | ORAL_TABLET | Freq: Three times a day (TID) | ORAL | 0 refills | Status: DC

## 2019-10-02 DIAGNOSIS — L603 Nail dystrophy: Secondary | ICD-10-CM | POA: Diagnosis not present

## 2019-10-02 DIAGNOSIS — I739 Peripheral vascular disease, unspecified: Secondary | ICD-10-CM | POA: Diagnosis not present

## 2019-10-02 DIAGNOSIS — B351 Tinea unguium: Secondary | ICD-10-CM | POA: Diagnosis not present

## 2019-10-07 ENCOUNTER — Encounter: Payer: Self-pay | Admitting: Internal Medicine

## 2019-10-07 ENCOUNTER — Non-Acute Institutional Stay (SKILLED_NURSING_FACILITY): Payer: Medicare Other | Admitting: Internal Medicine

## 2019-10-07 DIAGNOSIS — I5032 Chronic diastolic (congestive) heart failure: Secondary | ICD-10-CM

## 2019-10-07 DIAGNOSIS — I130 Hypertensive heart and chronic kidney disease with heart failure and stage 1 through stage 4 chronic kidney disease, or unspecified chronic kidney disease: Secondary | ICD-10-CM

## 2019-10-07 DIAGNOSIS — I4811 Longstanding persistent atrial fibrillation: Secondary | ICD-10-CM

## 2019-10-07 DIAGNOSIS — N1832 Chronic kidney disease, stage 3b: Secondary | ICD-10-CM | POA: Diagnosis not present

## 2019-10-07 NOTE — Progress Notes (Signed)
Location:  [name]: 108/W Place of Service:  SNF (31)  Wendy Duos, MD  Patient Care Team: Wendy Duos, MD as PCP - General (Internal Medicine) Wendy Perkins [name], NP as Nurse Practitioner ([place]) [facility], [place] ([place])  Extended Emergency Contact Information Primary Emergency Contact: Perkins, Wendy [address] Wendy Perkins of [place] Phone: [phone] Relation: Son Secondary Emergency Contact: Perkins [place], [place] [address] Wendy Perkins of [place] Phone: [phone] Mobile Phone: [phone] Relation: Son    Allergies: Sulfur  Chief Complaint  Patient presents with  . Medical Management of Chronic Issues    Routine visit of medical managment    HPI: Patient is 85 y.o. female who is being seen for routine issues of chronic atrial fibrillation, chronic diastolic congestive heart failure, and hypertension.  Past Medical History:  Diagnosis Date  . Atrial fibrillation ([place])   . Bradycardia   . Breast nodule 11/15/2014  . Breast pain, left 11/15/2014  . Carotid artery disease ([place])   . Dizziness   . Dyspnea    Previous CPX suggesting possible restrictive physiology, respiratory muscle fatigue, diastolic dysfunction  . Essential hypertension, benign   . Hyperlipidemia   . Oxygen dependent    2 liter  . Pacemaker [place]   . Rib pain on left side 11/15/2014  . Seasonal allergies   . Shingles 05/04/2015  . Stroke ([place])   . Toe fracture, left    left big toe    Past Surgical History:  Procedure Laterality Date  . COLONOSCOPY  02/22/2012   Procedure: COLONOSCOPY;  Surgeon: Rogene Houston, MD;  [facility];  Service: Endoscopy;  Laterality: N/A;  100  . KNEE SURGERY     bilateral  . PACEMAKER IMPLANT N/A 07/17/2017   Procedure: Pacemaker Implant;  Surgeon: Deboraha Sprang, MD;  Location: [facility];  Service:  Cardiovascular;  Laterality: N/A;    Outpatient Encounter Medications as of 10/07/2019  Medication Sig  . acetaminophen (TYLENOL) 500 MG tablet Take 1,000 mg by mouth 2 (two) times daily. Max of 3 grams acetaminophen per 24 hrs from all sources.  Marland Kitchen albuterol (PROVENTIL HFA;VENTOLIN HFA) 108 (90 Base) MCG/ACT inhaler Inhale 2 puffs into the lungs at bedtime. For SOB or wheezing  . ANORO ELLIPTA 62.5-25 MCG/INH AEPB Inhale 1 puff into the lungs daily.   Marland Kitchen apixaban (ELIQUIS) 2.5 MG TABS tablet Take 1 tablet (2.5 mg total) by mouth 2 (two) times daily. Resume 07/20/17  . artificial tears (LACRILUBE) OINT ophthalmic ointment Place 1 application into both eyes at bedtime.  . cycloSPORINE (RESTASIS) 0.05 % ophthalmic emulsion Place 1 drop into both eyes every 12 (twelve) hours.   Marland Kitchen diltiazem (CARDIZEM CD) 180 MG 24 hr capsule TAKE ONE CAPSULE BY MOUTH ONCE DAILY.  . furosemide (LASIX) 20 MG tablet Take 20 mg by mouth daily.   Marland Kitchen gabapentin (NEURONTIN) 100 MG capsule Take 100 mg by mouth 2 (two) times daily.   Marland Kitchen ipratropium-albuterol (DUONEB) 0.5-2.5 (3) MG/3ML SOLN Take 3 mLs by nebulization every 6 (six) hours as needed. Starting on 01/25/2019  For wheezing or SOB  . Melatonin 5 MG TABS Take 5 mg by mouth at bedtime as needed.   . Menthol, Topical Analgesic, (BIOFREEZE) 4 % GEL Apply 1 application topically 2 (two) times daily. Apply topically to left shoulder  and neck for M/S pain   . Multiple Vitamin (MULITIVITAMIN WITH MINERALS) TABS Take 1 tablet by mouth every other day.   . NON FORMULARY Diet Type:  Regular  . Nutritional Supplements (ENSURE ENLIVE PO) Take 237 mLs by mouth daily.   . OXYGEN Inhale 2 L into the lungs 2 (two) times daily. 3:15pm to 11:15pm 11:15pm to 07:15am  . pantoprazole (PROTONIX) 40 MG tablet Take 40 mg by mouth daily.   Vladimir Faster Glycol-Propyl Glycol (SYSTANE OP) Place 1 drop into both eyes 3 (three) times daily. For dry eyes  . polyethylene glycol (MIRALAX / GLYCOLAX)  packet Take 17 g by mouth daily as needed. For constipation  . traMADol (ULTRAM) 50 MG tablet Take 1 tablet (50 mg total) by mouth 3 (three) times daily.  . [DISCONTINUED] Albuterol (VENTOLIN IN) Inhale into the lungs.     No facility-administered encounter medications on file as of 10/07/2019.     No orders of the defined types were placed in this encounter.   Immunization History  Administered Date(s) Administered  . Influenza-Unspecified 10/10/2013, 09/07/2016, 09/11/2017, 09/11/2018, 09/14/2019  . Pneumococcal Conjugate-13 09/13/2017  . Pneumococcal-Unspecified 09/20/2016  . Tdap 09/16/2017  . Zoster 12/05/2017    Social History   Tobacco Use  . Smoking status: Never Smoker  . Smokeless tobacco: Never Used  Substance Use Topics  . Alcohol use: No    Review of Systems  DATA OBTAINED: from nurse GENERAL:  no fevers, fatigue, appetite changes SKIN: No itching, rash HEENT: No complaint RESPIRATORY: No cough, wheezing, SOB CARDIAC: No chest pain, palpitations, lower extremity edema  GI: No abdominal pain, No N/V/D or constipation, No heartburn or reflux  GU: No dysuria, frequency or urgency, or incontinence  MUSCULOSKELETAL: No unrelieved bone/joint pain NEUROLOGIC: No headache, dizziness  PSYCHIATRIC: No overt anxiety or sadness  Vitals:   10/07/19 1130  BP: 138/75  Pulse: 60  Resp: 16  Temp: 98 F (36.7 C)  SpO2: 94%   Body mass index is 23.48 kg/m. Physical Exam  GENERAL APPEARANCE: Alert, conversant, No acute distress  SKIN: No diaphoresis rash HEENT: Unremarkable RESPIRATORY: Breathing is even, unlabored. Lung sounds are clear   CARDIOVASCULAR: Heart RRR no murmurs, rubs or gallops. No peripheral edema  GASTROINTESTINAL: Abdomen is soft, non-tender, not distended w/ normal bowel sounds.  GENITOURINARY: Bladder non tender, not distended  MUSCULOSKELETAL: No abnormal joints or musculature NEUROLOGIC: Cranial nerves 2-12 grossly intact. Moves all  extremities PSYCHIATRIC: Mood and affect appropriate to situation, no behavioral issues  Patient Active Problem List   Diagnosis Date Noted  . Left shoulder pain 08/09/2019  . Weight loss 05/06/2019  . E-coli UTI 05/06/2019  . 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 Chronic kidney disease, stage 3 (moderate) 01/27/2019  . GERD without esophagitis 01/27/2019  . Chronic constipation 01/27/2019  . Peripheral neuropathy 01/27/2019  . Pacemaker 04/01/2018  . Sinus node dysfunction ([place]) 07/17/2017  . Complete heart block ([place]) 07/14/2017  . Ventricular escape rhythm   . Hemorrhoids 06/03/2017  . Retinal vein occlusion of right eye 05/14/2017  . Internal carotid artery stenosis, bilateral 03/07/2017  . Back pain 03/07/2017  . CVA (cerebral vascular accident) ([place]) 10/17/2015  . Chronic obstructive pulmonary disease (COPD) ([place]) 10/17/2015  . Orthostatic hypotension 09/30/2013  . Atrial fibrillation ([place]) 11/23/2011  . Chronic diastolic heart failure ([place]) 11/23/2011  . Essential hypertension 07/13/2010    CMP  Component Value Date/Time   NA 141 07/02/2019 0734   K 4.3 07/02/2019 0734   CL 106 07/02/2019 0734   CO2 26 07/02/2019 0734   GLUCOSE 91 07/02/2019 0734   BUN 29 (H) 07/02/2019 0734   CREATININE 0.96 07/02/2019 0734   CREATININE 1.23 (H) 01/16/2012 1425   CALCIUM 8.7 (L) 07/02/2019 0734   PROT 7.0 04/30/2019 1300   ALBUMIN 4.2 04/30/2019 1300   AST 33 04/30/2019 1300   ALT 21 04/30/2019 1300   ALKPHOS 108 04/30/2019 1300   BILITOT 1.2 04/30/2019 1300   GFRNONAA 49 (L) 07/02/2019 0734   GFRAA 57 (L) 07/02/2019 0734   Recent Labs    04/09/19 0722 04/30/19 1300 07/02/19 0734  NA 143 140 141  K 3.6 3.5 4.3  CL 108 101 106  CO2 26 23 26   GLUCOSE 94 93 91  BUN 26* 27* 29*  CREATININE 0.95 1.08* 0.96  CALCIUM 8.5* 9.0 8.7*   Recent Labs    10/28/18 0750  04/30/19 1300  AST 23 33  ALT 20 21  ALKPHOS 115 108  BILITOT 1.1 1.2  PROT 7.0 7.0  ALBUMIN 3.6 4.2   Recent Labs    01/21/19 1030 01/26/19 0936 04/30/19 1300  WBC 11.6* 11.2* 8.8  NEUTROABS 8.6* 7.9* 7.0  HGB 12.8 13.3 14.2  HCT 41.0 43.2 44.2  MCV 88.6 89.1 87.0  PLT 186 307 205   No results for input(s): CHOL, LDLCALC, TRIG in the last 8760 hours.  Invalid input(s): HCL No results found for: [facility] Lab Results  Component Value Date   TSH 1.446 07/15/2017   Lab Results  Component Value Date   HGBA1C 5.5 12/06/2016   Lab Results  Component Value Date   CHOL 113 12/07/2017   HDL 56 12/07/2017   LDLCALC 48 12/07/2017   TRIG 47 12/07/2017   CHOLHDL 2.0 12/07/2017    Significant Diagnostic Results in last 30 days:  No results found.  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

## 2019-10-10 ENCOUNTER — Encounter: Payer: Self-pay | Admitting: Internal Medicine

## 2019-10-11 NOTE — Assessment & Plan Note (Addendum)
Rate controlled on diltiazem 180 CD and prophylaxed with Eliquis 2.5 mg twice daily; continue current regimen

## 2019-10-11 NOTE — Assessment & Plan Note (Signed)
No reported recent exacerbations; continue Lasix 20 mg daily

## 2019-10-11 NOTE — Assessment & Plan Note (Signed)
Blood pressures controlled; continue diltiazem 180 mg CD daily, Lasix 20 mg daily

## 2019-10-20 ENCOUNTER — Ambulatory Visit (INDEPENDENT_AMBULATORY_CARE_PROVIDER_SITE_OTHER): Payer: Medicare Other | Admitting: *Deleted

## 2019-10-20 DIAGNOSIS — I442 Atrioventricular block, complete: Secondary | ICD-10-CM

## 2019-10-20 DIAGNOSIS — I495 Sick sinus syndrome: Secondary | ICD-10-CM

## 2019-10-20 LAB — CUP PACEART REMOTE DEVICE CHECK
Battery Remaining Longevity: 146 mo
Battery Remaining Percentage: 95.5 %
Battery Voltage: 3.01 V
Brady Statistic RV Percent Paced: 99 %
Date Time Interrogation Session: 20201110070015
Implantable Lead Implant Date: 20180808
Implantable Lead Location: 753860
Implantable Lead Model: 1948
Implantable Pulse Generator Implant Date: 20180808
Lead Channel Impedance Value: 650 Ohm
Lead Channel Pacing Threshold Amplitude: 0.5 V
Lead Channel Pacing Threshold Pulse Width: 0.5 ms
Lead Channel Sensing Intrinsic Amplitude: 12 mV
Lead Channel Setting Pacing Amplitude: 0.75 V
Lead Channel Setting Pacing Pulse Width: 0.5 ms
Lead Channel Setting Sensing Sensitivity: 0.5 mV
Pulse Gen Model: 1272
Pulse Gen Serial Number: 7957820

## 2019-10-27 ENCOUNTER — Other Ambulatory Visit: Payer: Self-pay | Admitting: Adult Health

## 2019-10-27 MED ORDER — TRAMADOL HCL 50 MG PO TABS: 50.0000 mg | ORAL_TABLET | Freq: Three times a day (TID) | ORAL | 0 refills | Status: DC

## 2019-11-03 ENCOUNTER — Encounter: Payer: Self-pay | Admitting: Adult Health

## 2019-11-03 ENCOUNTER — Non-Acute Institutional Stay (SKILLED_NURSING_FACILITY): Payer: Medicare Other | Admitting: Adult Health

## 2019-11-03 DIAGNOSIS — I130 Hypertensive heart and chronic kidney disease with heart failure and stage 1 through stage 4 chronic kidney disease, or unspecified chronic kidney disease: Secondary | ICD-10-CM

## 2019-11-03 DIAGNOSIS — J449 Chronic obstructive pulmonary disease, unspecified: Secondary | ICD-10-CM

## 2019-11-03 DIAGNOSIS — R634 Abnormal weight loss: Secondary | ICD-10-CM | POA: Diagnosis not present

## 2019-11-03 DIAGNOSIS — N1832 Chronic kidney disease, stage 3b: Secondary | ICD-10-CM

## 2019-11-03 DIAGNOSIS — I5032 Chronic diastolic (congestive) heart failure: Secondary | ICD-10-CM

## 2019-11-03 NOTE — Progress Notes (Signed)
Location:    [facility] Room Number: 108/W Place of Service:  SNF (31)   CODE STATUS: DNR  Allergies  Allergen Reactions  . Sulfur Swelling and Rash    Chief Complaint  Patient presents with  . Medical Management of Chronic Issues         Hypertensive heart disease and kidney disease with chronic diastolic congestive heart failure and stage 3 chronic kidney disease   Weight loss: . Chronic obstructive pulmonary disease:    HPI:  She is a 85 year old long term resident of this facility begin seen for the management of her chronic illnesses: hypertensive heart disease; chf; weight loss. Her wight is presently stable. Her appetite is good. There are no reports of uncontrolled pain; no reports of anxiety or agitation.   Past Medical History:  Diagnosis Date  . Atrial fibrillation ([place])   . Bradycardia   . Breast nodule 11/15/2014  . Breast pain, left 11/15/2014  . Carotid artery disease ([place])   . Dizziness   . Dyspnea    Previous CPX suggesting possible restrictive physiology, respiratory muscle fatigue, diastolic dysfunction  . Essential hypertension, benign   . Hyperlipidemia   . Oxygen dependent    2 liter  . Pacemaker [place]   . Rib pain on left side 11/15/2014  . Seasonal allergies   . Shingles 05/04/2015  . Stroke ([place])   . Toe fracture, left    left big toe    Past Surgical History:  Procedure Laterality Date  . COLONOSCOPY  02/22/2012   Procedure: COLONOSCOPY;  Surgeon: Rogene Houston, MD;  [facility];  Service: Endoscopy;  Laterality: N/A;  100  . KNEE SURGERY     bilateral  . PACEMAKER IMPLANT N/A 07/17/2017   Procedure: Pacemaker Implant;  Surgeon: Deboraha Sprang, MD;  Location: [facility];  Service: Cardiovascular;  Laterality: N/A;    Social History   Socioeconomic History  . Marital status: Widowed    Spouse name: Not on file  . Number of children: Not on file  . Years of education: Not on file  .  Highest education level: Not on file  Occupational History  . Not on file  Social Needs  . Financial resource strain: Not hard at all  . Food insecurity    Worry: Never true    Inability: Never true  . Transportation needs    Medical: No    Non-medical: No  Tobacco Use  . Smoking status: Never Smoker  . Smokeless tobacco: Never Used  Substance and Sexual Activity  . Alcohol use: No  . Drug use: No  . Sexual activity: Never    Birth control/protection: Post-menopausal  Lifestyle  . Physical activity    Days per week: 0 days    Minutes per session: 0 min  . Stress: Not at all  Relationships  . Social Herbalist on phone: Three times a week    Gets together: Twice a week    Attends religious service: Never    Active member of club or organization: No    Attends meetings of clubs or organizations: Never    Relationship status: Widowed  . Intimate partner violence    Fear of current or ex partner: No    Emotionally abused: No    Physically abused: No    Forced sexual activity: No  Other Topics Concern  . Not on file  Social History Narrative  .  Not on file   Family History  Problem Relation Age of Onset  . Stroke Mother   . Pneumonia Father   . Heart disease Father   . Healthy Son   . Glaucoma Daughter   . Emphysema Daughter   . Healthy Daughter   . Other Daughter        had knee replacement  . Healthy Son   . Heart disease Maternal Grandmother       VITAL SIGNS BP 135/70   Pulse (!) 56   Temp 97.8 F (36.6 C) (Oral)   Resp 16   Ht 5\' 4"  (1.626 m)   Wt 138 lb (62.6 kg)   SpO2 94%   BMI 23.69 kg/m   Outpatient Encounter Medications as of 11/03/2019  Medication Sig  . acetaminophen (TYLENOL) 500 MG tablet Take 1,000 mg by mouth 2 (two) times daily. Max of 3 grams acetaminophen per 24 hrs from all sources.  Marland Kitchen albuterol (PROVENTIL HFA;VENTOLIN HFA) 108 (90 Base) MCG/ACT inhaler Inhale 2 puffs into the lungs at bedtime. For SOB or wheezing  .  ANORO ELLIPTA 62.5-25 MCG/INH AEPB Inhale 1 puff into the lungs daily.   Marland Kitchen apixaban (ELIQUIS) 2.5 MG TABS tablet Take 1 tablet (2.5 mg total) by mouth 2 (two) times daily. Resume 07/20/17  . artificial tears (LACRILUBE) OINT ophthalmic ointment Place 1 application into both eyes at bedtime.  . cycloSPORINE (RESTASIS) 0.05 % ophthalmic emulsion Place 1 drop into both eyes every 12 (twelve) hours.   Marland Kitchen diltiazem (CARDIZEM CD) 180 MG 24 hr capsule TAKE ONE CAPSULE BY MOUTH ONCE DAILY.  . furosemide (LASIX) 20 MG tablet Take 20 mg by mouth daily.   Marland Kitchen gabapentin (NEURONTIN) 100 MG capsule Take 100 mg by mouth 2 (two) times daily.   Marland Kitchen ipratropium-albuterol (DUONEB) 0.5-2.5 (3) MG/3ML SOLN Take 3 mLs by nebulization every 6 (six) hours as needed. Starting on 01/25/2019  For wheezing or SOB  . Melatonin 5 MG TABS Take 5 mg by mouth at bedtime as needed.   . Menthol, Topical Analgesic, (BIOFREEZE) 4 % GEL Apply 1 application topically 2 (two) times daily. Apply topically to left shoulder and neck for M/S pain   . Multiple Vitamin (MULITIVITAMIN WITH MINERALS) TABS Take 1 tablet by mouth every other day.   . NON FORMULARY Diet Type:  Regular  . Nutritional Supplements (ENSURE ENLIVE PO) Take 237 mLs by mouth daily.   . OXYGEN Inhale 2 L into the lungs 2 (two) times daily. 3:15pm to 11:15pm 11:15pm to 07:15am  . pantoprazole (PROTONIX) 40 MG tablet Take 40 mg by mouth daily.   Vladimir Faster Glycol-Propyl Glycol (SYSTANE OP) Place 1 drop into both eyes 3 (three) times daily. For dry eyes  . polyethylene glycol (MIRALAX / GLYCOLAX) packet Take 17 g by mouth daily as needed. For constipation  . traMADol (ULTRAM) 50 MG tablet Take 1 tablet (50 mg total) by mouth 3 (three) times daily.  . [DISCONTINUED] Albuterol (VENTOLIN IN) Inhale into the lungs.     No facility-administered encounter medications on file as of 11/03/2019.      SIGNIFICANT DIAGNOSTIC EXAMS   LABS REVIEWED: PREVIOUS   12-07-17: chol 113;  ldl 48  trig 47 hdl 56  01-26-19: wbc 11.2; hgb  13.3; hct 43.2; mcv 89.1 plt 307; glucose 83; bun 34; creat 1.00; k+ 4.1; na++ 142; ca 8.7  02-21-19: glucose 83; bun 34; creat 1.09 k+ 4.0; na++ 141 ca 8.7 03-20-19: glucose 102; bun 17; creat  0.89;  k+ 3.5; na++142 ca 8.7 04-09-19: glucose 94; bun 26; creat 0.95; k+ 3.6; na++ 143; ca 8.5  04-30-19: wbc 8.8 hgb 14.2; hct 4.2; mcv 87.0; plt 205; glucose 93; bun 27; creat 1.08; k+ 3.5; na++ 140; ca 9.0 total bili 0.3; albumin 4.2 urine culture: e-coli: macrobid  07-02-19: glucose 91; bun 29; creat 0.96; k+ 4.3; na++ 141; ca 8.7  NO NEW LABS.   Review of Systems  Constitutional: Negative for malaise/fatigue.  Respiratory: Negative for cough and shortness of breath.   Cardiovascular: Negative for chest pain, palpitations and leg swelling.  Gastrointestinal: Negative for abdominal pain, constipation and heartburn.  Musculoskeletal: Negative for back pain, joint pain and myalgias.  Skin: Negative.   Neurological: Negative for dizziness.  Psychiatric/Behavioral: The patient is not nervous/anxious.    Physical Exam Constitutional:      General: She is not in acute distress.    Appearance: She is well-developed. She is not diaphoretic.  Neck:     Musculoskeletal: Neck supple.     Thyroid: No thyromegaly.  Cardiovascular:     Rate and Rhythm: Normal rate and regular rhythm.     Pulses: Normal pulses.     Heart sounds: Normal heart sounds.     Comments: Pacemaker  Pulmonary:     Effort: Pulmonary effort is normal. No respiratory distress.     Breath sounds: Normal breath sounds.     Comments: 02 dependent  Abdominal:     General: Bowel sounds are normal. There is no distension.     Palpations: Abdomen is soft.     Tenderness: There is no abdominal tenderness.  Musculoskeletal:     Right lower leg: No edema.     Left lower leg: No edema.     Comments: Is able to move all extremities Has limited range of motion in left shoulder.   :    Lymphadenopathy:     Cervical: No cervical adenopathy.  Skin:    General: Skin is warm and dry.  Neurological:     Mental Status: She is alert. Mental status is at baseline.  Psychiatric:        Mood and Affect: Mood normal.      ASSESSMENT/ PLAN:  TODAY;   1. Hypertensive heart disease and kidney disease with chronic diastolic congestive heart failure and stage 3 chronic kidney disease is stable b/p 135/70 will continue cardizem cd 180 mg daily is off norvasc will monitor  2. Weight loss: her weight is presently stable on 03-06-19: 153 pounds; 05-25-19: weight is 137 pounds; current weight is 138 pounds will continue supplements as directed and will monitor her status.   3. Chronic obstructive pulmonary disease: is stable 02 dependent; will continue anoro 62.5/25 mcg 1 puff daily albuterol 2 puffs nightly and has duoneb every 6 hours as needed.   PREVIOUS   4. Dyslipidemia: is stable is off lipitor will monitor   5. Chronic kidney disease stage 3 (moderate) is stable bun 27; creat 1.08 will monitor   6. GERD without esophagitis: is stable will continue protonix 40 mg twice daily   7. Chronic constipation: is stable will continue miralax daily as needed   8. Chronic bilateral low back pain without sciatica : is stable will continue tylenol 1 gm twice daily and ultram 50 mg three times daily   9. Peripheral neuropathy: is stable will continue neurontin 100 mg twice daily  10. CVA: (cerebrovascular accident) is stable will continue eliquis 2.5 mg twice daily  11. Complete heart block: is status post [employer] PTV/DP pace maker will monitor  12. Chronic diastolic heart failure: EF 60-65 % (06-21-17): is stable will continue lasix 20 mg daily   13. Longstanding persistent atrial fibrillation: is status post pace make: will continue cardizem cd 180 mg daily for rate control will continue continue eliquis 2.5 mg twice daily     Will check cbc cmp       MD is aware of  resident's narcotic use and is in agreement with current plan of care. We will attempt to wean resident as appropriate.  Ok Edwards NP [facility]  Contact [phone] Monday through Friday 8am- 5pm  After hours call [phone]

## 2019-11-04 ENCOUNTER — Encounter (HOSPITAL_COMMUNITY)
Admission: RE | Admit: 2019-11-04 | Discharge: 2019-11-04 | Disposition: A | Discharge status: Home / Self Care | Payer: Medicare Other | Source: Skilled Nursing Facility | Attending: Adult Health | Admitting: Adult Health

## 2019-11-04 DIAGNOSIS — I130 Hypertensive heart and chronic kidney disease with heart failure and stage 1 through stage 4 chronic kidney disease, or unspecified chronic kidney disease: Secondary | ICD-10-CM | POA: Diagnosis present

## 2019-11-04 LAB — CBC
HCT: 42.1 % (ref 36.0–46.0)
Hemoglobin: 13.3 g/dL (ref 12.0–15.0)
MCH: 29.2 pg (ref 26.0–34.0)
MCHC: 31.6 g/dL (ref 30.0–36.0)
MCV: 92.3 fL (ref 80.0–100.0)
Platelets: 202 10*3/uL (ref 150–400)
RBC: 4.56 MIL/uL (ref 3.87–5.11)
RDW: 14.5 % (ref 11.5–15.5)
WBC: 5.2 10*3/uL (ref 4.0–10.5)
nRBC: 0 % (ref 0.0–0.2)

## 2019-11-04 LAB — COMPREHENSIVE METABOLIC PANEL
ALT: 15 U/L (ref 0–44)
AST: 19 U/L (ref 15–41)
Albumin: 3.5 g/dL (ref 3.5–5.0)
Alkaline Phosphatase: 81 U/L (ref 38–126)
Anion gap: 11 (ref 5–15)
BUN: 20 mg/dL (ref 8–23)
CO2: 25 mmol/L (ref 22–32)
Calcium: 8.8 mg/dL — ABNORMAL LOW (ref 8.9–10.3)
Chloride: 106 mmol/L (ref 98–111)
Creatinine, Ser: 0.7 mg/dL (ref 0.44–1.00)
GFR calc Af Amer: >60 mL/min (ref >60)
GFR calc non Af Amer: >60 mL/min (ref >60)
Glucose, Bld: 92 mg/dL (ref 70–99)
Potassium: 3.9 mmol/L (ref 3.5–5.1)
Sodium: 142 mmol/L (ref 135–145)
Total Bilirubin: 0.6 mg/dL (ref 0.3–1.2)
Total Protein: 6 g/dL — ABNORMAL LOW (ref 6.5–8.1)

## 2019-11-10 NOTE — Progress Notes (Signed)
Remote pacemaker transmission.   

## 2019-11-15 ENCOUNTER — Other Ambulatory Visit (HOSPITAL_COMMUNITY)
Admission: RE | Admit: 2019-11-15 | Discharge: 2019-11-15 | Disposition: A | Discharge status: Home / Self Care | Payer: Medicare Other | Source: Ambulatory Visit | Attending: Internal Medicine | Admitting: Internal Medicine

## 2019-11-15 ENCOUNTER — Other Ambulatory Visit: Payer: Self-pay | Admitting: Internal Medicine

## 2019-11-15 DIAGNOSIS — Z9189 Other specified personal risk factors, not elsewhere classified: Secondary | ICD-10-CM

## 2019-11-15 DIAGNOSIS — Z20828 Contact with and (suspected) exposure to other viral communicable diseases: Secondary | ICD-10-CM | POA: Diagnosis present

## 2019-11-18 LAB — SARS CORONAVIRUS 2 (TAT 6-24 HRS): SARS Coronavirus 2: NEGATIVE

## 2019-11-21 ENCOUNTER — Other Ambulatory Visit (HOSPITAL_COMMUNITY)
Admission: RE | Admit: 2019-11-21 | Discharge: 2019-11-21 | Disposition: A | Discharge status: Home / Self Care | Payer: Medicare Other | Source: Ambulatory Visit | Attending: Internal Medicine | Admitting: Internal Medicine

## 2019-11-21 ENCOUNTER — Other Ambulatory Visit: Payer: Self-pay | Admitting: Internal Medicine

## 2019-11-21 DIAGNOSIS — Z9189 Other specified personal risk factors, not elsewhere classified: Secondary | ICD-10-CM

## 2019-11-21 DIAGNOSIS — Z20828 Contact with and (suspected) exposure to other viral communicable diseases: Secondary | ICD-10-CM | POA: Insufficient documentation

## 2019-11-21 LAB — SARS CORONAVIRUS 2 (TAT 6-24 HRS): SARS Coronavirus 2: NEGATIVE

## 2019-11-24 ENCOUNTER — Other Ambulatory Visit: Payer: Self-pay | Admitting: Adult Health

## 2019-11-24 MED ORDER — TRAMADOL HCL 50 MG PO TABS: 50.0000 mg | ORAL_TABLET | Freq: Three times a day (TID) | ORAL | 0 refills | Status: DC

## 2019-11-25 ENCOUNTER — Other Ambulatory Visit (HOSPITAL_COMMUNITY)
Admission: RE | Admit: 2019-11-25 | Discharge: 2019-11-25 | Disposition: A | Discharge status: Home / Self Care | Payer: Medicare Other | Source: Ambulatory Visit | Attending: Internal Medicine | Admitting: Internal Medicine

## 2019-11-25 DIAGNOSIS — Z20828 Contact with and (suspected) exposure to other viral communicable diseases: Secondary | ICD-10-CM | POA: Diagnosis present

## 2019-11-27 LAB — NOVEL CORONAVIRUS, NAA (HOSP ORDER, SEND-OUT TO REF LAB; TAT 18-24 HRS): SARS-CoV-2, NAA: NOT DETECTED

## 2019-11-30 ENCOUNTER — Other Ambulatory Visit (HOSPITAL_COMMUNITY)
Admission: RE | Admit: 2019-11-30 | Discharge: 2019-11-30 | Disposition: A | Discharge status: Home / Self Care | Payer: Medicare Other | Source: Ambulatory Visit | Attending: Internal Medicine | Admitting: Internal Medicine

## 2019-11-30 DIAGNOSIS — Z20828 Contact with and (suspected) exposure to other viral communicable diseases: Secondary | ICD-10-CM | POA: Diagnosis present

## 2019-12-01 ENCOUNTER — Non-Acute Institutional Stay (SKILLED_NURSING_FACILITY): Payer: Medicare Other | Admitting: Adult Health

## 2019-12-01 ENCOUNTER — Encounter: Payer: Self-pay | Admitting: Adult Health

## 2019-12-01 DIAGNOSIS — G8929 Other chronic pain: Secondary | ICD-10-CM | POA: Diagnosis not present

## 2019-12-01 DIAGNOSIS — K219 Gastro-esophageal reflux disease without esophagitis: Secondary | ICD-10-CM | POA: Diagnosis not present

## 2019-12-01 DIAGNOSIS — M545 Low back pain: Secondary | ICD-10-CM

## 2019-12-01 DIAGNOSIS — N1831 Chronic kidney disease, stage 3a: Secondary | ICD-10-CM | POA: Diagnosis not present

## 2019-12-01 NOTE — Progress Notes (Signed)
Location:    [place] Room Number: 108/W Place of Service:  SNF (31)   CODE STATUS: DNR  Allergies  Allergen Reactions  . Sulfur Swelling and Rash    Chief Complaint  Patient presents with  . Medical Management of Chronic Issues       Chronic kidney disease stage 3 (moderate)  GERD without esophagitis  Chronic bilateral low back pain without sciatica    HPI:  She is being seen for the management of her chronic illnesses: ckd gerd; back pain. She denies any uncontrolled pain; no changes in appetite weight is stable; no reports of anxiety.  She is high risk for covid due to her advanced age; and resident of snf. And lung disease. I have discussed the mederina vaccine with the family sides effects severe effects and expected outcomes. They have agreed to have the vaccine given.   Past Medical History:  Diagnosis Date  . Atrial fibrillation ([place])   . Bradycardia   . Breast nodule 11/15/2014  . Breast pain, left 11/15/2014  . Carotid artery disease ([place])   . Dizziness   . Dyspnea    Previous CPX suggesting possible restrictive physiology, respiratory muscle fatigue, diastolic dysfunction  . Essential hypertension, benign   . Hyperlipidemia   . Oxygen dependent    2 liter  . Pacemaker [place]   . Rib pain on left side 11/15/2014  . Seasonal allergies   . Shingles 05/04/2015  . Stroke ([place])   . Toe fracture, left    left big toe    Past Surgical History:  Procedure Laterality Date  . COLONOSCOPY  02/22/2012   Procedure: COLONOSCOPY;  Surgeon: Rogene Houston, MD;  [facility];  Service: Endoscopy;  Laterality: N/A;  100  . KNEE SURGERY     bilateral  . PACEMAKER IMPLANT N/A 07/17/2017   Procedure: Pacemaker Implant;  Surgeon: Deboraha Sprang, MD;  Location: [place] [facility];  Service: Cardiovascular;  Laterality: N/A;    Social History   Socioeconomic History  . Marital status: Widowed    Spouse name: Not on file  . Number of  children: Not on file  . Years of education: Not on file  . Highest education level: Not on file  Occupational History  . Not on file  Tobacco Use  . Smoking status: Never Smoker  . Smokeless tobacco: Never Used  Substance and Sexual Activity  . Alcohol use: No  . Drug use: No  . Sexual activity: Never    Birth control/protection: Post-menopausal  Other Topics Concern  . Not on file  Social History Narrative  . Not on file   Social Determinants of Health   Financial Resource Strain:   . Difficulty of Paying Living Expenses: Not on file  Food Insecurity:   . Worried About Charity fundraiser in the Last Year: Not on file  . Ran Out of Food in the Last Year: Not on file  Transportation Needs:   . Lack of Transportation (Medical): Not on file  . Lack of Transportation (Non-Medical): Not on file  Physical Activity:   . Days of Exercise per Week: Not on file  . Minutes of Exercise per Session: Not on file  Stress:   . Feeling of Stress : Not on file  Social Connections:   . Frequency of Communication with Friends and Family: Not on file  . Frequency of Social Gatherings with Friends and Family: Not on file  .  Attends Religious Services: Not on file  . Active Member of Clubs or Organizations: Not on file  . Attends Archivist Meetings: Not on file  . Marital Status: Not on file  Intimate Partner Violence:   . Fear of Current or Ex-Partner: Not on file  . Emotionally Abused: Not on file  . Physically Abused: Not on file  . Sexually Abused: Not on file   Family History  Problem Relation Age of Onset  . Stroke Mother   . Pneumonia Father   . Heart disease Father   . Healthy Son   . Glaucoma Daughter   . Emphysema Daughter   . Healthy Daughter   . Other Daughter        had knee replacement  . Healthy Son   . Heart disease Maternal Grandmother       VITAL SIGNS BP (!) 161/80   Pulse 63   Temp (!) 97 F (36.1 C) (Oral)   Resp 19   Ht 5\' 4"  (1.626 m)    Wt 139 lb 6.4 oz (63.2 kg)   SpO2 94%   BMI 23.93 kg/m   Outpatient Encounter Medications as of 12/01/2019  Medication Sig  . acetaminophen (TYLENOL) 500 MG tablet Take 1,000 mg by mouth 2 (two) times daily. Max of 3 grams acetaminophen per 24 hrs from all sources.  Marland Kitchen albuterol (PROVENTIL HFA;VENTOLIN HFA) 108 (90 Base) MCG/ACT inhaler Inhale 2 puffs into the lungs at bedtime. For SOB or wheezing  . ANORO ELLIPTA 62.5-25 MCG/INH AEPB Inhale 1 puff into the lungs daily.   Marland Kitchen apixaban (ELIQUIS) 2.5 MG TABS tablet Take 1 tablet (2.5 mg total) by mouth 2 (two) times daily. Resume 07/20/17  . artificial tears (LACRILUBE) OINT ophthalmic ointment Place 1 application into both eyes at bedtime.  Marland Kitchen ascorbic acid (VITAMIN C) 500 MG tablet Take 500 mg by mouth daily.  . cycloSPORINE (RESTASIS) 0.05 % ophthalmic emulsion Place 1 drop into both eyes every 12 (twelve) hours.   Marland Kitchen diltiazem (CARDIZEM CD) 180 MG 24 hr capsule TAKE ONE CAPSULE BY MOUTH ONCE DAILY.  . furosemide (LASIX) 20 MG tablet Take 20 mg by mouth daily.   Marland Kitchen gabapentin (NEURONTIN) 100 MG capsule Take 100 mg by mouth 2 (two) times daily.   Marland Kitchen ipratropium-albuterol (DUONEB) 0.5-2.5 (3) MG/3ML SOLN Take 3 mLs by nebulization every 6 (six) hours as needed. Starting on 01/25/2019  For wheezing or SOB  . Melatonin 5 MG TABS Take 5 mg by mouth at bedtime as needed.   . Menthol, Topical Analgesic, (BIOFREEZE) 4 % GEL Apply 1 application topically 2 (two) times daily. Apply topically to left shoulder and neck for M/S pain   . Multiple Vitamin (MULITIVITAMIN WITH MINERALS) TABS Take 1 tablet by mouth every other day.   . NON FORMULARY Diet Type:  Regular  . Nutritional Supplements (ENSURE ENLIVE PO) Take 237 mLs by mouth daily.   . OXYGEN Inhale 2 L into the lungs 2 (two) times daily. 3:15pm to 11:15pm 11:15pm to 07:15am  . pantoprazole (PROTONIX) 40 MG tablet Take 40 mg by mouth daily.   Vladimir Faster Glycol-Propyl Glycol (SYSTANE OP) Place 1 drop  into both eyes 3 (three) times daily. For dry eyes  . polyethylene glycol (MIRALAX / GLYCOLAX) packet Take 17 g by mouth daily as needed. For constipation  . traMADol (ULTRAM) 50 MG tablet Take 1 tablet (50 mg total) by mouth 3 (three) times daily.  Marland Kitchen zinc sulfate 220 (50 Zn) MG  capsule Take 220 mg by mouth daily.  . [DISCONTINUED] Albuterol (VENTOLIN IN) Inhale into the lungs.     No facility-administered encounter medications on file as of 12/01/2019.     SIGNIFICANT DIAGNOSTIC EXAMS   LABS REVIEWED: PREVIOUS   01-26-19: wbc 11.2; hgb  13.3; hct 43.2; mcv 89.1 plt 307; glucose 83; bun 34; creat 1.00; k+ 4.1; na++ 142; ca 8.7  02-21-19: glucose 83; bun 34; creat 1.09 k+ 4.0; na++ 141 ca 8.7 03-20-19: glucose 102; bun 17; creat 0.89;  k+ 3.5; na++142 ca 8.7 04-09-19: glucose 94; bun 26; creat 0.95; k+ 3.6; na++ 143; ca 8.5  04-30-19: wbc 8.8 hgb 14.2; hct 4.2; mcv 87.0; plt 205; glucose 93; bun 27; creat 1.08; k+ 3.5; na++ 140; ca 9.0 total bili 0.3; albumin 4.2 urine culture: e-coli: macrobid  07-02-19: glucose 91; bun 29; creat 0.96; k+ 4.3; na++ 141; ca 8.7  TODAY   11-04-19: wbc 5.2; hgb 13.3; hct 42.1; mcv 92.3 plt 202; glucose 92; bun 20; creat 0.70; k+ 3.9; na++ 142; ca 8.8; liver normal albumin 3.5   Review of Systems  Constitutional: Negative for malaise/fatigue.  Respiratory: Negative for cough and shortness of breath.   Cardiovascular: Negative for chest pain, palpitations and leg swelling.  Gastrointestinal: Negative for abdominal pain, constipation and heartburn.  Musculoskeletal: Negative for back pain, joint pain and myalgias.  Skin: Negative.   Neurological: Negative for dizziness.  Psychiatric/Behavioral: The patient is not nervous/anxious.     Physical Exam Constitutional:      General: She is not in acute distress.    Appearance: She is well-developed. She is not diaphoretic.  Neck:     Thyroid: No thyromegaly.  Cardiovascular:     Rate and Rhythm: Normal rate  and regular rhythm.     Pulses: Normal pulses.     Heart sounds: Normal heart sounds.     Comments: Pace maker Pulmonary:     Effort: Pulmonary effort is normal. No respiratory distress.     Breath sounds: Normal breath sounds.     Comments: 02 dependent  Abdominal:     General: Bowel sounds are normal. There is no distension.     Palpations: Abdomen is soft.     Tenderness: There is no abdominal tenderness.  Musculoskeletal:     Cervical back: Neck supple.     Right lower leg: No edema.     Left lower leg: No edema.     Comments: Is able to move all extremities Has limited range of motion in left shoulder.  Lymphadenopathy:     Cervical: No cervical adenopathy.  Skin:    General: Skin is warm and dry.  Neurological:     Mental Status: She is alert. Mental status is at baseline.  Psychiatric:        Mood and Affect: Mood normal.      ASSESSMENT/ PLAN:  TODAY;   1. Chronic kidney disease stage 3 (moderate) is stable bun 20; creat 0.70; will monitor   2. GERD without esophagitis is stable will continue protonix 40 mg daily   3. Chronic bilateral low back pain without sciatica is stable will continue tylenol 1 gm twice daily and ultram 50 mg three times daily   PREVIOUS   4. Dyslipidemia: is stable is off lipitor will monitor   5. Chronic constipation: is stable will continue miralax daily as needed   6. Peripheral neuropathy: is stable will continue neurontin 100 mg twice daily  7. CVA: (cerebrovascular accident)  is stable will continue eliquis 2.5 mg twice daily   8. Complete heart block: is status post [employer] PTV/DP pace maker will monitor  9. Chronic diastolic heart failure: EF 60-65 % (06-21-17): is stable will continue lasix 20 mg daily   10. Longstanding persistent atrial fibrillation: is status post pace make: will continue cardizem cd 180 mg daily for rate control will continue continue eliquis 2.5 mg twice daily   11. Hypertensive heart disease and  kidney disease with chronic diastolic congestive heart failure and stage 3 chronic kidney disease is stable b/p 161/80 will continue cardizem cd 180 mg daily is off norvasc will monitor  12. Weight loss: her weight is presently stable on 03-06-19: 153 pounds; 05-25-19: weight is 137 pounds; current weight is 139 pounds will continue supplements as directed and will monitor her status.   13. Chronic obstructive pulmonary disease: is stable 02 dependent; will continue anoro 62.5/25 mcg 1 puff daily albuterol 2 puffs nightly and has duoneb every 6 hours as needed.   She is to receive the first injection on 12-08-19     MD is aware of resident's narcotic use and is in agreement with current plan of care. We will attempt to wean resident as appropriate.  Ok Edwards NP [facility]  Contact [phone] Monday through Friday 8am- 5pm  After hours call [phone]

## 2019-12-02 LAB — NOVEL CORONAVIRUS, NAA (HOSP ORDER, SEND-OUT TO REF LAB; TAT 18-24 HRS): SARS-CoV-2, NAA: NOT DETECTED

## 2019-12-03 ENCOUNTER — Other Ambulatory Visit: Payer: Self-pay | Admitting: Adult Health

## 2019-12-06 ENCOUNTER — Other Ambulatory Visit (HOSPITAL_COMMUNITY)
Admission: RE | Admit: 2019-12-06 | Discharge: 2019-12-06 | Disposition: A | Discharge status: Home / Self Care | Payer: Medicare Other | Source: Ambulatory Visit | Attending: Adult Health | Admitting: Adult Health

## 2019-12-06 DIAGNOSIS — Z20828 Contact with and (suspected) exposure to other viral communicable diseases: Secondary | ICD-10-CM | POA: Diagnosis present

## 2019-12-07 LAB — NOVEL CORONAVIRUS, NAA (HOSP ORDER, SEND-OUT TO REF LAB; TAT 18-24 HRS): SARS-CoV-2, NAA: NOT DETECTED

## 2019-12-10 ENCOUNTER — Non-Acute Institutional Stay (SKILLED_NURSING_FACILITY): Payer: Medicare Other | Admitting: Adult Health

## 2019-12-10 DIAGNOSIS — G8929 Other chronic pain: Secondary | ICD-10-CM

## 2019-12-10 DIAGNOSIS — M545 Low back pain, unspecified: Secondary | ICD-10-CM

## 2019-12-10 DIAGNOSIS — M25512 Pain in left shoulder: Secondary | ICD-10-CM

## 2019-12-11 ENCOUNTER — Encounter: Payer: Self-pay | Admitting: Adult Health

## 2019-12-11 DIAGNOSIS — M6281 Muscle weakness (generalized): Secondary | ICD-10-CM | POA: Diagnosis not present

## 2019-12-11 DIAGNOSIS — M25561 Pain in right knee: Secondary | ICD-10-CM | POA: Diagnosis not present

## 2019-12-11 DIAGNOSIS — M1990 Unspecified osteoarthritis, unspecified site: Secondary | ICD-10-CM | POA: Diagnosis not present

## 2019-12-11 NOTE — Progress Notes (Signed)
Location:  [place] Room Number: 108-W Place of Service:  SNF (31)   CODE STATUS: DNR  Allergies  Allergen Reactions  . Sulfur Swelling and Rash    Chief Complaint  Patient presents with  . Medication Management    Medication review    HPI:  She is presently taking ultram 50 mg three times daily; tylenol 1 gm twice daily for her back pain and left shoulder pain. Her pain is presently being managed without use of prn medications. There are no reports of changes in appetite no reports of insomnia.    Past Medical History:  Diagnosis Date  . Atrial fibrillation ([place])   . Bradycardia   . Breast nodule 11/15/2014  . Breast pain, left 11/15/2014  . Carotid artery disease ([place])   . Dizziness   . Dyspnea    Previous CPX suggesting possible restrictive physiology, respiratory muscle fatigue, diastolic dysfunction  . Essential hypertension, benign   . Hyperlipidemia   . Oxygen dependent    2 liter  . Pacemaker [place]   . Rib pain on left side 11/15/2014  . Seasonal allergies   . Shingles 05/04/2015  . Stroke ([place])   . Toe fracture, left    left big toe    Past Surgical History:  Procedure Laterality Date  . COLONOSCOPY  02/22/2012   Procedure: COLONOSCOPY;  Surgeon: Rogene Houston, MD;  [facility];  Service: Endoscopy;  Laterality: N/A;  100  . KNEE SURGERY     bilateral  . PACEMAKER IMPLANT N/A 07/17/2017   Procedure: Pacemaker Implant;  Surgeon: Deboraha Sprang, MD;  Location: [place] [facility];  Service: Cardiovascular;  Laterality: N/A;    Social History   Socioeconomic History  . Marital status: Widowed    Spouse name: Not on file  . Number of children: Not on file  . Years of education: Not on file  . Highest education level: Not on file  Occupational History  . Not on file  Tobacco Use  . Smoking status: Never Smoker  . Smokeless tobacco: Never Used  Substance and Sexual Activity  . Alcohol use: No  . Drug use: No   . Sexual activity: Never    Birth control/protection: Post-menopausal  Other Topics Concern  . Not on file  Social History Narrative  . Not on file   Social Determinants of Health   Financial Resource Strain:   . Difficulty of Paying Living Expenses: Not on file  Food Insecurity:   . Worried About Charity fundraiser in the Last Year: Not on file  . Ran Out of Food in the Last Year: Not on file  Transportation Needs:   . Lack of Transportation (Medical): Not on file  . Lack of Transportation (Non-Medical): Not on file  Physical Activity:   . Days of Exercise per Week: Not on file  . Minutes of Exercise per Session: Not on file  Stress:   . Feeling of Stress : Not on file  Social Connections:   . Frequency of Communication with Friends and Family: Not on file  . Frequency of Social Gatherings with Friends and Family: Not on file  . Attends Religious Services: Not on file  . Active Member of Clubs or Organizations: Not on file  . Attends Archivist Meetings: Not on file  . Marital Status: Not on file  Intimate Partner Violence:   . Fear of Current or Ex-Partner: Not on file  .  Emotionally Abused: Not on file  . Physically Abused: Not on file  . Sexually Abused: Not on file   Family History  Problem Relation Age of Onset  . Stroke Mother   . Pneumonia Father   . Heart disease Father   . Healthy Son   . Glaucoma Daughter   . Emphysema Daughter   . Healthy Daughter   . Other Daughter        had knee replacement  . Healthy Son   . Heart disease Maternal Grandmother       VITAL SIGNS BP (!) 161/80   Pulse 63   Temp (!) 97 F (36.1 C) (Oral)   Resp 19   Ht 5\' 4"  (1.626 m)   Wt 139 lb 6.4 oz (63.2 kg)   SpO2 96%   BMI 23.93 kg/m   Outpatient Encounter Medications as of 12/10/2019  Medication Sig  . acetaminophen (TYLENOL) 500 MG tablet Take 1,000 mg by mouth 2 (two) times daily. Max of 3 grams acetaminophen per 24 hrs from all sources.  Marland Kitchen  albuterol (PROVENTIL HFA;VENTOLIN HFA) 108 (90 Base) MCG/ACT inhaler Inhale 2 puffs into the lungs at bedtime. For SOB or wheezing  . ANORO ELLIPTA 62.5-25 MCG/INH AEPB Inhale 1 puff into the lungs daily.   Marland Kitchen apixaban (ELIQUIS) 2.5 MG TABS tablet Take 1 tablet (2.5 mg total) by mouth 2 (two) times daily. Resume 07/20/17  . artificial tears (LACRILUBE) OINT ophthalmic ointment Place 1 application into both eyes at bedtime.  . cycloSPORINE (RESTASIS) 0.05 % ophthalmic emulsion Place 1 drop into both eyes every 12 (twelve) hours.   Marland Kitchen diltiazem (CARDIZEM CD) 180 MG 24 hr capsule TAKE ONE CAPSULE BY MOUTH ONCE DAILY.  . furosemide (LASIX) 20 MG tablet Take 20 mg by mouth daily.   Marland Kitchen gabapentin (NEURONTIN) 100 MG capsule Take 100 mg by mouth 2 (two) times daily.   Marland Kitchen ipratropium-albuterol (DUONEB) 0.5-2.5 (3) MG/3ML SOLN Take 3 mLs by nebulization every 6 (six) hours as needed. Starting on 01/25/2019  For wheezing or SOB  . Melatonin 5 MG TABS Take 5 mg by mouth at bedtime as needed.   . Menthol, Topical Analgesic, (BIOFREEZE) 4 % GEL Apply 1 application topically 2 (two) times daily. Apply topically to left shoulder and neck for M/S pain   . Multiple Vitamin (MULITIVITAMIN WITH MINERALS) TABS Take 1 tablet by mouth every other day.   . NON FORMULARY Diet Type:  Regular  . Nutritional Supplements (ENSURE ENLIVE PO) Take 237 mLs by mouth daily.   . OXYGEN Inhale 2 L into the lungs 2 (two) times daily. 3:15pm to 11:15pm 11:15pm to 07:15am  . pantoprazole (PROTONIX) 40 MG tablet Take 40 mg by mouth daily.   Vladimir Faster Glycol-Propyl Glycol (SYSTANE OP) Place 1 drop into both eyes 3 (three) times daily. For dry eyes  . polyethylene glycol (MIRALAX / GLYCOLAX) packet Take 17 g by mouth daily as needed. For constipation  . traMADol (ULTRAM) 50 MG tablet Take 1 tablet (50 mg total) by mouth 3 (three) times daily.   No facility-administered encounter medications on file as of 12/10/2019.     SIGNIFICANT  DIAGNOSTIC EXAMS   LABS REVIEWED: PREVIOUS   01-26-19: wbc 11.2; hgb  13.3; hct 43.2; mcv 89.1 plt 307; glucose 83; bun 34; creat 1.00; k+ 4.1; na++ 142; ca 8.7  02-21-19: glucose 83; bun 34; creat 1.09 k+ 4.0; na++ 141 ca 8.7 03-20-19: glucose 102; bun 17; creat 0.89;  k+ 3.5; na++142  ca 8.7 04-09-19: glucose 94; bun 26; creat 0.95; k+ 3.6; na++ 143; ca 8.5  04-30-19: wbc 8.8 hgb 14.2; hct 4.2; mcv 87.0; plt 205; glucose 93; bun 27; creat 1.08; k+ 3.5; na++ 140; ca 9.0 total bili 0.3; albumin 4.2 urine culture: e-coli: macrobid  07-02-19: glucose 91; bun 29; creat 0.96; k+ 4.3; na++ 141; ca 8.7 11-04-19: wbc 5.2; hgb 13.3; hct 42.1; mcv 92.3 plt 202; glucose 92; bun 20; creat 0.70; k+ 3.9; na++ 142; ca 8.8; liver normal albumin 3.5   NO NEW LABS.   Review of Systems  Constitutional: Negative for malaise/fatigue.  Respiratory: Negative for cough and shortness of breath.   Cardiovascular: Negative for chest pain, palpitations and leg swelling.  Gastrointestinal: Negative for abdominal pain, constipation and heartburn.  Musculoskeletal: Negative for back pain, joint pain and myalgias.  Skin: Negative.   Neurological: Negative for dizziness.  Psychiatric/Behavioral: The patient is not nervous/anxious.     Physical Exam Constitutional:      General: She is not in acute distress.    Appearance: She is well-developed. She is not diaphoretic.  Neck:     Thyroid: No thyromegaly.  Cardiovascular:     Rate and Rhythm: Normal rate and regular rhythm.     Pulses: Normal pulses.     Heart sounds: Normal heart sounds.     Comments: Pace maker  Pulmonary:     Effort: Pulmonary effort is normal. No respiratory distress.     Breath sounds: Normal breath sounds.     Comments: 02 dependent  Abdominal:     General: Bowel sounds are normal. There is no distension.     Palpations: Abdomen is soft.     Tenderness: There is no abdominal tenderness.  Musculoskeletal:     Cervical back: Neck supple.      Right lower leg: No edema.     Left lower leg: No edema.     Comments: Is able to move all extremities Has limited range of motion in left shoulder.   Lymphadenopathy:     Cervical: No cervical adenopathy.  Skin:    General: Skin is warm and dry.  Neurological:     Mental Status: She is alert. Mental status is at baseline.  Psychiatric:        Mood and Affect: Mood normal.        ASSESSMENT/ PLAN:  TODAY  1. Chronic left shoulder pain  2. Chronic back pain  Is stable will continue ultram 50 mg three times daily and tylenol 1 gm twice daily     MD is aware of resident's narcotic use and is in agreement with current plan of care. We will attempt to wean resident as appropriate.  Ok Edwards NP [facility]  Contact [phone] Monday through Friday 8am- 5pm  After hours call [phone]

## 2019-12-12 DIAGNOSIS — M545 Low back pain: Secondary | ICD-10-CM | POA: Diagnosis not present

## 2019-12-12 DIAGNOSIS — M25552 Pain in left hip: Secondary | ICD-10-CM | POA: Diagnosis not present

## 2019-12-12 DIAGNOSIS — M25551 Pain in right hip: Secondary | ICD-10-CM | POA: Diagnosis not present

## 2019-12-14 DIAGNOSIS — M6281 Muscle weakness (generalized): Secondary | ICD-10-CM | POA: Diagnosis not present

## 2019-12-14 DIAGNOSIS — M1990 Unspecified osteoarthritis, unspecified site: Secondary | ICD-10-CM | POA: Diagnosis not present

## 2019-12-14 DIAGNOSIS — M25561 Pain in right knee: Secondary | ICD-10-CM | POA: Diagnosis not present

## 2019-12-15 DIAGNOSIS — M6281 Muscle weakness (generalized): Secondary | ICD-10-CM | POA: Diagnosis not present

## 2019-12-15 DIAGNOSIS — M1990 Unspecified osteoarthritis, unspecified site: Secondary | ICD-10-CM | POA: Diagnosis not present

## 2019-12-15 DIAGNOSIS — M25561 Pain in right knee: Secondary | ICD-10-CM | POA: Diagnosis not present

## 2019-12-16 DIAGNOSIS — M25561 Pain in right knee: Secondary | ICD-10-CM | POA: Diagnosis not present

## 2019-12-16 DIAGNOSIS — I130 Hypertensive heart and chronic kidney disease with heart failure and stage 1 through stage 4 chronic kidney disease, or unspecified chronic kidney disease: Secondary | ICD-10-CM | POA: Diagnosis not present

## 2019-12-16 DIAGNOSIS — I63312 Cerebral infarction due to thrombosis of left middle cerebral artery: Secondary | ICD-10-CM | POA: Diagnosis not present

## 2019-12-16 DIAGNOSIS — Z1159 Encounter for screening for other viral diseases: Secondary | ICD-10-CM | POA: Diagnosis not present

## 2019-12-16 DIAGNOSIS — M1990 Unspecified osteoarthritis, unspecified site: Secondary | ICD-10-CM | POA: Diagnosis not present

## 2019-12-16 DIAGNOSIS — M6281 Muscle weakness (generalized): Secondary | ICD-10-CM | POA: Diagnosis not present

## 2019-12-17 DIAGNOSIS — M1990 Unspecified osteoarthritis, unspecified site: Secondary | ICD-10-CM | POA: Diagnosis not present

## 2019-12-17 DIAGNOSIS — M25561 Pain in right knee: Secondary | ICD-10-CM | POA: Diagnosis not present

## 2019-12-17 DIAGNOSIS — M6281 Muscle weakness (generalized): Secondary | ICD-10-CM | POA: Diagnosis not present

## 2019-12-18 DIAGNOSIS — M6281 Muscle weakness (generalized): Secondary | ICD-10-CM | POA: Diagnosis not present

## 2019-12-18 DIAGNOSIS — M25561 Pain in right knee: Secondary | ICD-10-CM | POA: Diagnosis not present

## 2019-12-18 DIAGNOSIS — M1990 Unspecified osteoarthritis, unspecified site: Secondary | ICD-10-CM | POA: Diagnosis not present

## 2019-12-20 DIAGNOSIS — M1990 Unspecified osteoarthritis, unspecified site: Secondary | ICD-10-CM | POA: Diagnosis not present

## 2019-12-20 DIAGNOSIS — M6281 Muscle weakness (generalized): Secondary | ICD-10-CM | POA: Diagnosis not present

## 2019-12-20 DIAGNOSIS — M25561 Pain in right knee: Secondary | ICD-10-CM | POA: Diagnosis not present

## 2019-12-21 ENCOUNTER — Other Ambulatory Visit: Payer: Self-pay | Admitting: Adult Health

## 2019-12-21 MED ORDER — TRAMADOL HCL 50 MG PO TABS: 50.0000 mg | ORAL_TABLET | Freq: Three times a day (TID) | ORAL | 0 refills | Status: DC

## 2019-12-23 ENCOUNTER — Encounter: Payer: Self-pay | Admitting: Adult Health

## 2019-12-23 ENCOUNTER — Non-Acute Institutional Stay (SKILLED_NURSING_FACILITY): Payer: Medicare Other | Admitting: Adult Health

## 2019-12-23 DIAGNOSIS — N1832 Chronic kidney disease, stage 3b: Secondary | ICD-10-CM | POA: Diagnosis not present

## 2019-12-23 DIAGNOSIS — J449 Chronic obstructive pulmonary disease, unspecified: Secondary | ICD-10-CM | POA: Diagnosis not present

## 2019-12-23 DIAGNOSIS — I130 Hypertensive heart and chronic kidney disease with heart failure and stage 1 through stage 4 chronic kidney disease, or unspecified chronic kidney disease: Secondary | ICD-10-CM

## 2019-12-23 DIAGNOSIS — M25561 Pain in right knee: Secondary | ICD-10-CM | POA: Diagnosis not present

## 2019-12-23 DIAGNOSIS — I639 Cerebral infarction, unspecified: Secondary | ICD-10-CM | POA: Diagnosis not present

## 2019-12-23 DIAGNOSIS — M1990 Unspecified osteoarthritis, unspecified site: Secondary | ICD-10-CM | POA: Diagnosis not present

## 2019-12-23 DIAGNOSIS — M6281 Muscle weakness (generalized): Secondary | ICD-10-CM | POA: Diagnosis not present

## 2019-12-23 DIAGNOSIS — I5032 Chronic diastolic (congestive) heart failure: Secondary | ICD-10-CM

## 2019-12-23 NOTE — Progress Notes (Signed)
Location:    [facility] Room Number: [id number] Place of Service:  SNF (31) Phillips Grout NP    CODE STATUS: DNR  Allergies  Allergen Reactions  . Sulfur Swelling and Rash    Chief Complaint  Patient presents with  . Acute Visit    Care plan Meeting    HPI:  We have come together for her routine care plan meeting. BIMS 13/15; mood 5/30. She has had one fall in Jan without injury. Her weight is stable. There are no reports of uncontrolled pain; no changes in appetite; no reports of insomnia. She continues to be followed for her chronic illnesses including: chf; cva; copd.   Past Medical History:  Diagnosis Date  . Atrial fibrillation ([place])   . Bradycardia   . Breast nodule 11/15/2014  . Breast pain, left 11/15/2014  . Carotid artery disease ([place])   . Dizziness   . Dyspnea    Previous CPX suggesting possible restrictive physiology, respiratory muscle fatigue, diastolic dysfunction  . Essential hypertension, benign   . Hyperlipidemia   . Oxygen dependent    2 liter  . Pacemaker [place]   . Rib pain on left side 11/15/2014  . Seasonal allergies   . Shingles 05/04/2015  . Stroke ([place])   . Toe fracture, left    left big toe    Past Surgical History:  Procedure Laterality Date  . COLONOSCOPY  02/22/2012   Procedure: COLONOSCOPY;  Surgeon: Rogene Houston, MD;  [facility];  Service: Endoscopy;  Laterality: N/A;  100  . KNEE SURGERY     bilateral  . PACEMAKER IMPLANT N/A 07/17/2017   Procedure: Pacemaker Implant;  Surgeon: Deboraha Sprang, MD;  Location: [facility];  Service: Cardiovascular;  Laterality: N/A;    Social History   Socioeconomic History  . Marital status: Widowed    Spouse name: Not on file  . Number of children: Not on file  . Years of education: Not on file  . Highest education level: Not on file  Occupational History  . Not on file  Tobacco Use  . Smoking status: Never Smoker  . Smokeless tobacco: Never Used   Substance and Sexual Activity  . Alcohol use: No  . Drug use: No  . Sexual activity: Never    Birth control/protection: Post-menopausal  Other Topics Concern  . Not on file  Social History Narrative  . Not on file   Social Determinants of Health   Financial Resource Strain:   . Difficulty of Paying Living Expenses: Not on file  Food Insecurity:   . Worried About Charity fundraiser in the Last Year: Not on file  . Ran Out of Food in the Last Year: Not on file  Transportation Needs:   . Lack of Transportation (Medical): Not on file  . Lack of Transportation (Non-Medical): Not on file  Physical Activity:   . Days of Exercise per Week: Not on file  . Minutes of Exercise per Session: Not on file  Stress:   . Feeling of Stress : Not on file  Social Connections:   . Frequency of Communication with Friends and Family: Not on file  . Frequency of Social Gatherings with Friends and Family: Not on file  . Attends Religious Services: Not on file  . Active Member of Clubs or Organizations: Not on file  . Attends Archivist Meetings: Not on file  . Marital Status: Not on file  Intimate Partner Violence:   . Fear of Current or Ex-Partner: Not on file  . Emotionally Abused: Not on file  . Physically Abused: Not on file  . Sexually Abused: Not on file   Family History  Problem Relation Age of Onset  . Stroke Mother   . Pneumonia Father   . Heart disease Father   . Healthy Son   . Glaucoma Daughter   . Emphysema Daughter   . Healthy Daughter   . Other Daughter        had knee replacement  . Healthy Son   . Heart disease Maternal Grandmother       VITAL SIGNS BP (!) 149/72   Pulse (!) 54   Temp 97.6 F (36.4 C) (Oral)   Resp 20   Ht 5\' 4"  (1.626 m)   Wt 143 lb 12.8 oz (65.2 kg)   SpO2 97%   BMI 24.68 kg/m   Outpatient Encounter Medications as of 12/23/2019  Medication Sig  . acetaminophen (TYLENOL) 500 MG tablet Take 1,000 mg by mouth 2 (two) times daily.  Max of 3 grams acetaminophen per 24 hrs from all sources.  Marland Kitchen albuterol (PROVENTIL HFA;VENTOLIN HFA) 108 (90 Base) MCG/ACT inhaler Inhale 2 puffs into the lungs at bedtime. For SOB or wheezing  . ANORO ELLIPTA 62.5-25 MCG/INH AEPB Inhale 1 puff into the lungs daily.   Marland Kitchen apixaban (ELIQUIS) 2.5 MG TABS tablet Take 1 tablet (2.5 mg total) by mouth 2 (two) times daily. Resume 07/20/17  . artificial tears (LACRILUBE) OINT ophthalmic ointment Place 1 application into both eyes at bedtime.  . cycloSPORINE (RESTASIS) 0.05 % ophthalmic emulsion Place 1 drop into both eyes every 12 (twelve) hours.   Marland Kitchen diltiazem (CARDIZEM CD) 180 MG 24 hr capsule TAKE ONE CAPSULE BY MOUTH ONCE DAILY.  . furosemide (LASIX) 20 MG tablet Take 20 mg by mouth daily.   Marland Kitchen gabapentin (NEURONTIN) 100 MG capsule Take 100 mg by mouth 2 (two) times daily.   . Melatonin 5 MG TABS Take 5 mg by mouth at bedtime as needed.   . Menthol, Topical Analgesic, (BIOFREEZE) 4 % GEL Apply 1 application topically 2 (two) times daily. Apply topically to left shoulder and neck for M/S pain   . NON FORMULARY Diet Type:  Regular  . Nutritional Supplements (ENSURE ENLIVE PO) Take 237 mLs by mouth daily.   . OXYGEN Inhale 2 L into the lungs 2 (two) times daily. 3:15pm to 11:15pm 11:15pm to 07:15am  . pantoprazole (PROTONIX) 40 MG tablet Take 40 mg by mouth daily.   Vladimir Faster Glycol-Propyl Glycol (SYSTANE OP) Place 1 drop into both eyes 3 (three) times daily. For dry eyes  Administer 15 minutes after administering Restasis eye drops.  . polyethylene glycol (MIRALAX / GLYCOLAX) packet Take 17 g by mouth daily as needed. For constipation  . traMADol (ULTRAM) 50 MG tablet Take 1 tablet (50 mg total) by mouth 3 (three) times daily.  . [DISCONTINUED] Albuterol (VENTOLIN IN) Inhale into the lungs.    . [DISCONTINUED] ipratropium-albuterol (DUONEB) 0.5-2.5 (3) MG/3ML SOLN Take 3 mLs by nebulization every 6 (six) hours as needed. Starting on 01/25/2019  For  wheezing or SOB  . [DISCONTINUED] Multiple Vitamin (MULITIVITAMIN WITH MINERALS) TABS Take 1 tablet by mouth every other day.    No facility-administered encounter medications on file as of 12/23/2019.     SIGNIFICANT DIAGNOSTIC EXAMS   LABS REVIEWED: PREVIOUS   01-26-19: wbc 11.2; hgb  13.3; hct 43.2; mcv 89.1  plt 307; glucose 83; bun 34; creat 1.00; k+ 4.1; na++ 142; ca 8.7  02-21-19: glucose 83; bun 34; creat 1.09 k+ 4.0; na++ 141 ca 8.7 03-20-19: glucose 102; bun 17; creat 0.89;  k+ 3.5; na++142 ca 8.7 04-09-19: glucose 94; bun 26; creat 0.95; k+ 3.6; na++ 143; ca 8.5  04-30-19: wbc 8.8 hgb 14.2; hct 4.2; mcv 87.0; plt 205; glucose 93; bun 27; creat 1.08; k+ 3.5; na++ 140; ca 9.0 total bili 0.3; albumin 4.2 urine culture: e-coli: macrobid  07-02-19: glucose 91; bun 29; creat 0.96; k+ 4.3; na++ 141; ca 8.7 11-04-19: wbc 5.2; hgb 13.3; hct 42.1; mcv 92.3 plt 202; glucose 92; bun 20; creat 0.70; k+ 3.9; na++ 142; ca 8.8; liver normal albumin 3.5   NO NEW LABS.   Review of Systems  Constitutional: Negative for malaise/fatigue.  Respiratory: Negative for cough and shortness of breath.   Cardiovascular: Negative for chest pain, palpitations and leg swelling.  Gastrointestinal: Negative for abdominal pain, constipation and heartburn.  Musculoskeletal: Negative for back pain, joint pain and myalgias.  Skin: Negative.   Neurological: Negative for dizziness.  Psychiatric/Behavioral: The patient is not nervous/anxious.     Physical Exam Constitutional:      General: She is not in acute distress.    Appearance: She is well-developed. She is not diaphoretic.  Neck:     Thyroid: No thyromegaly.  Cardiovascular:     Rate and Rhythm: Normal rate and regular rhythm.     Pulses: Normal pulses.     Heart sounds: Normal heart sounds.     Comments: Pace maker  Pulmonary:     Effort: Pulmonary effort is normal. No respiratory distress.     Breath sounds: Normal breath sounds.     Comments: 02  dependent  Abdominal:     General: Bowel sounds are normal. There is no distension.     Palpations: Abdomen is soft.     Tenderness: There is no abdominal tenderness.  Musculoskeletal:     Cervical back: Neck supple.     Right lower leg: No edema.     Left lower leg: No edema.     Comments: Is able to move all extremities Has limited range of motion in left shoulder.    Lymphadenopathy:     Cervical: No cervical adenopathy.  Skin:    General: Skin is warm and dry.  Neurological:     Mental Status: She is alert. Mental status is at baseline.  Psychiatric:        Mood and Affect: Mood normal.      ASSESSMENT/ PLAN:  TODAY  1. Hypertensive heart and kidney disease with chronic diastolic congestive heart failure and stage 3b chronic kidney disease 2. Chronic obstructive pulmonary disease unspecified COPD type 3. Cerebrovascular accident (CVA) unspecified mechanism  Will continue current medications Will continue current plan of care Will continue to monitor her status.   MD is aware of resident's narcotic use and is in agreement with current plan of care. We will attempt to wean resident as appropriate.  Ok Edwards NP [facility]  Contact [phone] Monday through Friday 8am- 5pm  After hours call [phone]

## 2019-12-24 DIAGNOSIS — M25561 Pain in right knee: Secondary | ICD-10-CM | POA: Diagnosis not present

## 2019-12-24 DIAGNOSIS — Z1159 Encounter for screening for other viral diseases: Secondary | ICD-10-CM | POA: Diagnosis not present

## 2019-12-24 DIAGNOSIS — I130 Hypertensive heart and chronic kidney disease with heart failure and stage 1 through stage 4 chronic kidney disease, or unspecified chronic kidney disease: Secondary | ICD-10-CM | POA: Diagnosis not present

## 2019-12-24 DIAGNOSIS — I63312 Cerebral infarction due to thrombosis of left middle cerebral artery: Secondary | ICD-10-CM | POA: Diagnosis not present

## 2019-12-24 DIAGNOSIS — M1990 Unspecified osteoarthritis, unspecified site: Secondary | ICD-10-CM | POA: Diagnosis not present

## 2019-12-24 DIAGNOSIS — M6281 Muscle weakness (generalized): Secondary | ICD-10-CM | POA: Diagnosis not present

## 2019-12-29 DIAGNOSIS — M6281 Muscle weakness (generalized): Secondary | ICD-10-CM | POA: Diagnosis not present

## 2019-12-29 DIAGNOSIS — M1990 Unspecified osteoarthritis, unspecified site: Secondary | ICD-10-CM | POA: Diagnosis not present

## 2019-12-29 DIAGNOSIS — M25561 Pain in right knee: Secondary | ICD-10-CM | POA: Diagnosis not present

## 2019-12-30 DIAGNOSIS — M1990 Unspecified osteoarthritis, unspecified site: Secondary | ICD-10-CM | POA: Diagnosis not present

## 2019-12-30 DIAGNOSIS — M25561 Pain in right knee: Secondary | ICD-10-CM | POA: Diagnosis not present

## 2019-12-30 DIAGNOSIS — M6281 Muscle weakness (generalized): Secondary | ICD-10-CM | POA: Diagnosis not present

## 2019-12-31 DIAGNOSIS — M25561 Pain in right knee: Secondary | ICD-10-CM | POA: Diagnosis not present

## 2019-12-31 DIAGNOSIS — I63312 Cerebral infarction due to thrombosis of left middle cerebral artery: Secondary | ICD-10-CM | POA: Diagnosis not present

## 2019-12-31 DIAGNOSIS — M1990 Unspecified osteoarthritis, unspecified site: Secondary | ICD-10-CM | POA: Diagnosis not present

## 2019-12-31 DIAGNOSIS — Z1159 Encounter for screening for other viral diseases: Secondary | ICD-10-CM | POA: Diagnosis not present

## 2019-12-31 DIAGNOSIS — M6281 Muscle weakness (generalized): Secondary | ICD-10-CM | POA: Diagnosis not present

## 2019-12-31 DIAGNOSIS — I130 Hypertensive heart and chronic kidney disease with heart failure and stage 1 through stage 4 chronic kidney disease, or unspecified chronic kidney disease: Secondary | ICD-10-CM | POA: Diagnosis not present

## 2020-01-01 DIAGNOSIS — M1990 Unspecified osteoarthritis, unspecified site: Secondary | ICD-10-CM | POA: Diagnosis not present

## 2020-01-01 DIAGNOSIS — M25561 Pain in right knee: Secondary | ICD-10-CM | POA: Diagnosis not present

## 2020-01-01 DIAGNOSIS — M6281 Muscle weakness (generalized): Secondary | ICD-10-CM | POA: Diagnosis not present

## 2020-01-04 DIAGNOSIS — M25561 Pain in right knee: Secondary | ICD-10-CM | POA: Diagnosis not present

## 2020-01-04 DIAGNOSIS — M6281 Muscle weakness (generalized): Secondary | ICD-10-CM | POA: Diagnosis not present

## 2020-01-04 DIAGNOSIS — M1990 Unspecified osteoarthritis, unspecified site: Secondary | ICD-10-CM | POA: Diagnosis not present

## 2020-01-05 DIAGNOSIS — M1990 Unspecified osteoarthritis, unspecified site: Secondary | ICD-10-CM | POA: Diagnosis not present

## 2020-01-05 DIAGNOSIS — M25561 Pain in right knee: Secondary | ICD-10-CM | POA: Diagnosis not present

## 2020-01-05 DIAGNOSIS — M6281 Muscle weakness (generalized): Secondary | ICD-10-CM | POA: Diagnosis not present

## 2020-01-06 DIAGNOSIS — M6281 Muscle weakness (generalized): Secondary | ICD-10-CM | POA: Diagnosis not present

## 2020-01-06 DIAGNOSIS — M25561 Pain in right knee: Secondary | ICD-10-CM | POA: Diagnosis not present

## 2020-01-06 DIAGNOSIS — M1990 Unspecified osteoarthritis, unspecified site: Secondary | ICD-10-CM | POA: Diagnosis not present

## 2020-01-07 DIAGNOSIS — Z1159 Encounter for screening for other viral diseases: Secondary | ICD-10-CM | POA: Diagnosis not present

## 2020-01-07 DIAGNOSIS — M25561 Pain in right knee: Secondary | ICD-10-CM | POA: Diagnosis not present

## 2020-01-07 DIAGNOSIS — M1990 Unspecified osteoarthritis, unspecified site: Secondary | ICD-10-CM | POA: Diagnosis not present

## 2020-01-07 DIAGNOSIS — I130 Hypertensive heart and chronic kidney disease with heart failure and stage 1 through stage 4 chronic kidney disease, or unspecified chronic kidney disease: Secondary | ICD-10-CM | POA: Diagnosis not present

## 2020-01-07 DIAGNOSIS — I63312 Cerebral infarction due to thrombosis of left middle cerebral artery: Secondary | ICD-10-CM | POA: Diagnosis not present

## 2020-01-07 DIAGNOSIS — M6281 Muscle weakness (generalized): Secondary | ICD-10-CM | POA: Diagnosis not present

## 2020-01-11 ENCOUNTER — Encounter: Payer: Self-pay | Admitting: Adult Health

## 2020-01-11 ENCOUNTER — Non-Acute Institutional Stay (SKILLED_NURSING_FACILITY): Payer: Medicare Other | Admitting: Adult Health

## 2020-01-11 DIAGNOSIS — I639 Cerebral infarction, unspecified: Secondary | ICD-10-CM | POA: Diagnosis not present

## 2020-01-11 DIAGNOSIS — I442 Atrioventricular block, complete: Secondary | ICD-10-CM | POA: Diagnosis not present

## 2020-01-11 DIAGNOSIS — G588 Other specified mononeuropathies: Secondary | ICD-10-CM

## 2020-01-11 NOTE — Progress Notes (Signed)
Location:    [facility] Room Number: [id number] Place of Service:  SNF (31) Phillips Grout NP    CODE STATUS: DNR  Allergies  Allergen Reactions  . Sulfur Swelling and Rash    Chief Complaint  Patient presents with  . Medical Management of Chronic Issues        Peripheral neuropathy:  CVA (cerebrovascular accident): Complete heart block:    HPI:  She is a 86 year old long term resident of this facility being seen for the management of her chronic illnesses: peripheral neuropathy; cva; chb. There are no reports of uncontrolled pain; weight and appetite are stable. No reports of agitation or anxiety.   Past Medical History:  Diagnosis Date  . Atrial fibrillation ([place])   . Bradycardia   . Breast nodule 11/15/2014  . Breast pain, left 11/15/2014  . Carotid artery disease ([place])   . Dizziness   . Dyspnea    Previous CPX suggesting possible restrictive physiology, respiratory muscle fatigue, diastolic dysfunction  . Essential hypertension, benign   . Hyperlipidemia   . Oxygen dependent    2 liter  . Pacemaker [place]   . Rib pain on left side 11/15/2014  . Seasonal allergies   . Shingles 05/04/2015  . Stroke ([place])   . Toe fracture, left    left big toe    Past Surgical History:  Procedure Laterality Date  . COLONOSCOPY  02/22/2012   Procedure: COLONOSCOPY;  Surgeon: Rogene Houston, MD;  [facility];  Service: Endoscopy;  Laterality: N/A;  100  . KNEE SURGERY     bilateral  . PACEMAKER IMPLANT N/A 07/17/2017   Procedure: Pacemaker Implant;  Surgeon: Deboraha Sprang, MD;  Location: [facility];  Service: Cardiovascular;  Laterality: N/A;    Social History   Socioeconomic History  . Marital status: Widowed    Spouse name: Not on file  . Number of children: Not on file  . Years of education: Not on file  . Highest education level: Not on file  Occupational History  . Not on file  Tobacco Use  . Smoking status: Never Smoker  .  Smokeless tobacco: Never Used  Substance and Sexual Activity  . Alcohol use: No  . Drug use: No  . Sexual activity: Never    Birth control/protection: Post-menopausal  Other Topics Concern  . Not on file  Social History Narrative  . Not on file   Social Determinants of Health   Financial Resource Strain:   . Difficulty of Paying Living Expenses: Not on file  Food Insecurity:   . Worried About Charity fundraiser in the Last Year: Not on file  . Ran Out of Food in the Last Year: Not on file  Transportation Needs:   . Lack of Transportation (Medical): Not on file  . Lack of Transportation (Non-Medical): Not on file  Physical Activity:   . Days of Exercise per Week: Not on file  . Minutes of Exercise per Session: Not on file  Stress:   . Feeling of Stress : Not on file  Social Connections:   . Frequency of Communication with Friends and Family: Not on file  . Frequency of Social Gatherings with Friends and Family: Not on file  . Attends Religious Services: Not on file  . Active Member of Clubs or Organizations: Not on file  . Attends Archivist Meetings: Not on file  . Marital Status: Not on  file  Intimate Partner Violence:   . Fear of Current or Ex-Partner: Not on file  . Emotionally Abused: Not on file  . Physically Abused: Not on file  . Sexually Abused: Not on file   Family History  Problem Relation Age of Onset  . Stroke Mother   . Pneumonia Father   . Heart disease Father   . Healthy Son   . Glaucoma Daughter   . Emphysema Daughter   . Healthy Daughter   . Other Daughter        had knee replacement  . Healthy Son   . Heart disease Maternal Grandmother       VITAL SIGNS BP 130/61   Pulse 70   Temp 98.2 F (36.8 C) (Oral)   Resp 16   Ht 5\' 4"  (1.626 m)   Wt 143 lb 12.8 oz (65.2 kg)   SpO2 94%   BMI 24.68 kg/m   Outpatient Encounter Medications as of 01/11/2020  Medication Sig  . acetaminophen (TYLENOL) 500 MG tablet Take 1,000 mg by mouth  2 (two) times daily. Max of 3 grams acetaminophen per 24 hrs from all sources.  Marland Kitchen albuterol (PROVENTIL HFA;VENTOLIN HFA) 108 (90 Base) MCG/ACT inhaler Inhale 2 puffs into the lungs at bedtime. For SOB or wheezing  . ANORO ELLIPTA 62.5-25 MCG/INH AEPB Inhale 1 puff into the lungs daily.   Marland Kitchen apixaban (ELIQUIS) 2.5 MG TABS tablet Take 1 tablet (2.5 mg total) by mouth 2 (two) times daily. Resume 07/20/17  . artificial tears (LACRILUBE) OINT ophthalmic ointment Place 1 application into both eyes at bedtime.  . cycloSPORINE (RESTASIS) 0.05 % ophthalmic emulsion Place 1 drop into both eyes every 12 (twelve) hours.   Marland Kitchen diltiazem (CARDIZEM CD) 180 MG 24 hr capsule TAKE ONE CAPSULE BY MOUTH ONCE DAILY.  . furosemide (LASIX) 20 MG tablet Take 20 mg by mouth daily.   Marland Kitchen gabapentin (NEURONTIN) 100 MG capsule Take 100 mg by mouth 2 (two) times daily.   . Melatonin 5 MG TABS Take 5 mg by mouth at bedtime as needed.   . Menthol, Topical Analgesic, (BIOFREEZE) 4 % GEL Apply 1 application topically 2 (two) times daily. Apply topically to left shoulder and neck for M/S pain   . NON FORMULARY Diet Type:  Regular  . Nutritional Supplements (ENSURE ENLIVE PO) Take 237 mLs by mouth daily.   . OXYGEN Inhale 2 L into the lungs 2 (two) times daily. 3:15pm to 11:15pm 11:15pm to 07:15am  . pantoprazole (PROTONIX) 40 MG tablet Take 40 mg by mouth daily.   Vladimir Faster Glycol-Propyl Glycol (SYSTANE OP) Place 1 drop into both eyes 3 (three) times daily. For dry eyes  Administer 15 minutes after administering Restasis eye drops.  . polyethylene glycol (MIRALAX / GLYCOLAX) packet Take 17 g by mouth daily as needed. For constipation  . traMADol (ULTRAM) 50 MG tablet Take 1 tablet (50 mg total) by mouth 3 (three) times daily.  . [DISCONTINUED] Albuterol (VENTOLIN IN) Inhale into the lungs.     No facility-administered encounter medications on file as of 01/11/2020.     SIGNIFICANT DIAGNOSTIC EXAMS   LABS REVIEWED: PREVIOUS    01-26-19: wbc 11.2; hgb  13.3; hct 43.2; mcv 89.1 plt 307; glucose 83; bun 34; creat 1.00; k+ 4.1; na++ 142; ca 8.7  02-21-19: glucose 83; bun 34; creat 1.09 k+ 4.0; na++ 141 ca 8.7 03-20-19: glucose 102; bun 17; creat 0.89;  k+ 3.5; na++142 ca 8.7 04-09-19: glucose 94; bun 26;  creat 0.95; k+ 3.6; na++ 143; ca 8.5  04-30-19: wbc 8.8 hgb 14.2; hct 4.2; mcv 87.0; plt 205; glucose 93; bun 27; creat 1.08; k+ 3.5; na++ 140; ca 9.0 total bili 0.3; albumin 4.2 urine culture: e-coli: macrobid  07-02-19: glucose 91; bun 29; creat 0.96; k+ 4.3; na++ 141; ca 8.7 11-04-19: wbc 5.2; hgb 13.3; hct 42.1; mcv 92.3 plt 202; glucose 92; bun 20; creat 0.70; k+ 3.9; na++ 142; ca 8.8; liver normal albumin 3.5   NO NEW LABS.   Review of Systems  Constitutional: Negative for malaise/fatigue.  Respiratory: Negative for cough and shortness of breath.   Cardiovascular: Negative for chest pain, palpitations and leg swelling.  Gastrointestinal: Negative for abdominal pain, constipation and heartburn.  Musculoskeletal: Negative for back pain, joint pain and myalgias.  Skin: Negative.   Neurological: Negative for dizziness.  Psychiatric/Behavioral: The patient is not nervous/anxious.     Physical Exam Constitutional:      General: She is not in acute distress.    Appearance: She is well-developed. She is not diaphoretic.  Neck:     Thyroid: No thyromegaly.  Cardiovascular:     Rate and Rhythm: Normal rate and regular rhythm.     Heart sounds: Normal heart sounds.     Comments: Pace maker Pulmonary:     Effort: Pulmonary effort is normal. No respiratory distress.     Breath sounds: Normal breath sounds.     Comments: 02 dependent  Abdominal:     General: Bowel sounds are normal. There is no distension.     Palpations: Abdomen is soft.     Tenderness: There is no abdominal tenderness.  Musculoskeletal:     Cervical back: Neck supple.     Right lower leg: No edema.     Left lower leg: No edema.     Comments:   Is able to move all extremities Has limited range of motion in left shoulder.  Lymphadenopathy:     Cervical: No cervical adenopathy.  Skin:    General: Skin is warm and dry.  Neurological:     Mental Status: She is alert. Mental status is at baseline.  Psychiatric:        Mood and Affect: Mood normal.      ASSESSMENT/ PLAN:  TODAY;   1. Peripheral neuropathy: is stable will continue gabapentin 100 mg twice daily   2. CVA (cerebrovascular accident): is stable will continue eliquis 2.5 mg twice daily   3. Complete heart block: is status post Dt. Jude PTV/DP pace maker will monitor   PREVIOUS   4. Dyslipidemia: is stable is off lipitor will monitor   5. Chronic constipation: is stable will continue miralax daily as needed   6. Chronic diastolic heart failure: EF 60-65 % (06-21-17): is stable will continue lasix 20 mg daily   7. Longstanding persistent atrial fibrillation: is status post pace make: will continue cardizem cd 180 mg daily for rate control will continue continue eliquis 2.5 mg twice daily   8. Hypertensive heart disease and kidney disease with chronic diastolic congestive heart failure and stage 3 chronic kidney disease is stable b/p 130/61 will continue cardizem cd 180 mg daily is off norvasc will monitor  9. Weight loss: her weight is presently stable on 03-06-19: 153 pounds; 05-25-19: weight is 137 pounds; current weight is 143 pounds will continue supplements as directed and will monitor her status.   10. Chronic obstructive pulmonary disease: is stable 02 dependent; will continue anoro 62.5/25 mcg 1  puff daily albuterol 2 puffs nightly and has duoneb every 6 hours as needed.   11. Chronic kidney disease stage 3 (moderate) is stable bun 20; creat 0.70; will monitor   12. GERD without esophagitis is stable will continue protonix 40 mg daily   13. Chronic bilateral low back pain without sciatica is stable will continue tylenol 1 gm twice daily and ultram 50 mg  three times daily has biofreeze to neck and left shoulder     MD is aware of resident's narcotic use and is in agreement with current plan of care. We will attempt to wean resident as appropriate.  Ok Edwards NP [facility]  Contact [phone] Monday through Friday 8am- 5pm  After hours call [phone]

## 2020-01-18 ENCOUNTER — Other Ambulatory Visit: Payer: Self-pay | Admitting: Adult Health

## 2020-01-18 MED ORDER — TRAMADOL HCL 50 MG PO TABS: 50.0000 mg | ORAL_TABLET | Freq: Three times a day (TID) | ORAL | 0 refills | Status: DC

## 2020-01-19 ENCOUNTER — Ambulatory Visit (INDEPENDENT_AMBULATORY_CARE_PROVIDER_SITE_OTHER): Payer: Medicare Other | Admitting: *Deleted

## 2020-01-19 DIAGNOSIS — I495 Sick sinus syndrome: Secondary | ICD-10-CM

## 2020-01-19 LAB — CUP PACEART REMOTE DEVICE CHECK
Battery Remaining Longevity: 146 mo
Battery Remaining Percentage: 95.5 %
Battery Voltage: 3.01 V
Brady Statistic RV Percent Paced: 99 %
Date Time Interrogation Session: 20210209020015
Implantable Lead Implant Date: 20180808
Implantable Lead Location: 753860
Implantable Lead Model: 1948
Implantable Pulse Generator Implant Date: 20180808
Lead Channel Impedance Value: 630 Ohm
Lead Channel Pacing Threshold Amplitude: 0.5 V
Lead Channel Pacing Threshold Pulse Width: 0.5 ms
Lead Channel Sensing Intrinsic Amplitude: 12 mV
Lead Channel Setting Pacing Amplitude: 0.75 V
Lead Channel Setting Pacing Pulse Width: 0.5 ms
Lead Channel Setting Sensing Sensitivity: 0.5 mV
Pulse Gen Model: 1272
Pulse Gen Serial Number: 7957820

## 2020-01-20 NOTE — Progress Notes (Signed)
PPM Remote  

## 2020-02-03 ENCOUNTER — Other Ambulatory Visit: Payer: Self-pay | Admitting: Adult Health

## 2020-02-03 MED ORDER — TRAMADOL HCL 50 MG PO TABS: 50.0000 mg | ORAL_TABLET | Freq: Three times a day (TID) | ORAL | 0 refills | Status: DC

## 2020-02-09 ENCOUNTER — Non-Acute Institutional Stay (SKILLED_NURSING_FACILITY): Payer: Medicare Other | Admitting: Adult Health

## 2020-02-09 ENCOUNTER — Encounter: Payer: Self-pay | Admitting: Adult Health

## 2020-02-09 DIAGNOSIS — K219 Gastro-esophageal reflux disease without esophagitis: Secondary | ICD-10-CM

## 2020-02-09 DIAGNOSIS — I639 Cerebral infarction, unspecified: Secondary | ICD-10-CM

## 2020-02-09 DIAGNOSIS — I442 Atrioventricular block, complete: Secondary | ICD-10-CM

## 2020-02-09 DIAGNOSIS — K5909 Other constipation: Secondary | ICD-10-CM

## 2020-02-09 DIAGNOSIS — I5032 Chronic diastolic (congestive) heart failure: Secondary | ICD-10-CM

## 2020-02-09 DIAGNOSIS — G588 Other specified mononeuropathies: Secondary | ICD-10-CM

## 2020-02-09 DIAGNOSIS — I4811 Longstanding persistent atrial fibrillation: Secondary | ICD-10-CM

## 2020-02-09 DIAGNOSIS — M545 Low back pain: Secondary | ICD-10-CM

## 2020-02-09 DIAGNOSIS — N1831 Chronic kidney disease, stage 3a: Secondary | ICD-10-CM

## 2020-02-09 DIAGNOSIS — G8929 Other chronic pain: Secondary | ICD-10-CM

## 2020-02-09 DIAGNOSIS — N1832 Chronic kidney disease, stage 3b: Secondary | ICD-10-CM

## 2020-02-09 DIAGNOSIS — I130 Hypertensive heart and chronic kidney disease with heart failure and stage 1 through stage 4 chronic kidney disease, or unspecified chronic kidney disease: Secondary | ICD-10-CM

## 2020-02-09 NOTE — Progress Notes (Signed)
Location:    [facility] Room Number: 108/W Place of Service:  SNF (31)   CODE STATUS: DNR  Allergies  Allergen Reactions  . Sulfur Swelling and Rash    Chief Complaint  Patient presents with  . Annual Exam    Annual Visit    HPI:  Wendy Perkins is a 86 year old long term resident of this facility being seen for her annual comprehensive exam. There have been no hospitalizations. Her weight is stable. No uncontrolled pain; appetite is stable. No reports os anxiety or depressive thoughts. Wendy Perkins continues to be followed for her chronic illnesses including: chf; afib; heart block.   Past Medical History:  Diagnosis Date  . Atrial fibrillation ([place])   . Bradycardia   . Breast nodule 11/15/2014  . Breast pain, left 11/15/2014  . Carotid artery disease ([place])   . Dizziness   . Dyspnea    Previous CPX suggesting possible restrictive physiology, respiratory muscle fatigue, diastolic dysfunction  . Essential hypertension, benign   . Hyperlipidemia   . Oxygen dependent    2 liter  . Pacemaker [place]   . Rib pain on left side 11/15/2014  . Seasonal allergies   . Shingles 05/04/2015  . Stroke ([place])   . Toe fracture, left    left big toe    Past Surgical History:  Procedure Laterality Date  . COLONOSCOPY  02/22/2012   Procedure: COLONOSCOPY;  Surgeon: Rogene Houston, MD;  [facility]: [facility];  Service: Endoscopy;  Laterality: N/A;  100  . KNEE SURGERY     bilateral  . PACEMAKER IMPLANT N/A 07/17/2017   Procedure: Pacemaker Implant;  Surgeon: Deboraha Sprang, MD;  Location: [facility];  Service: Cardiovascular;  Laterality: N/A;    Social History   Socioeconomic History  . Marital status: Widowed    Spouse name: Not on file  . Number of children: Not on file  . Years of education: Not on file  . Highest education level: Not on file  Occupational History  . Not on file  Tobacco Use  . Smoking status: Never Smoker  . Smokeless tobacco: Never Used    Substance and Sexual Activity  . Alcohol use: No  . Drug use: No  . Sexual activity: Never    Birth control/protection: Post-menopausal  Other Topics Concern  . Not on file  Social History Narrative  . Not on file   Social Determinants of Health   Financial Resource Strain:   . Difficulty of Paying Living Expenses: Not on file  Food Insecurity:   . Worried About Charity fundraiser in the Last Year: Not on file  . Ran Out of Food in the Last Year: Not on file  Transportation Needs:   . Lack of Transportation (Medical): Not on file  . Lack of Transportation (Non-Medical): Not on file  Physical Activity:   . Days of Exercise per Week: Not on file  . Minutes of Exercise per Session: Not on file  Stress:   . Feeling of Stress : Not on file  Social Connections:   . Frequency of Communication with Friends and Family: Not on file  . Frequency of Social Gatherings with Friends and Family: Not on file  . Attends Religious Services: Not on file  . Active Member of Clubs or Organizations: Not on file  . Attends Archivist Meetings: Not on file  . Marital Status: Not on file  Intimate Partner Violence:   .  Fear of Current or Ex-Partner: Not on file  . Emotionally Abused: Not on file  . Physically Abused: Not on file  . Sexually Abused: Not on file   Family History  Problem Relation Age of Onset  . Stroke Mother   . Pneumonia Father   . Heart disease Father   . Healthy Son   . Glaucoma Daughter   . Emphysema Daughter   . Healthy Daughter   . Other Daughter        had knee replacement  . Healthy Son   . Heart disease Maternal Grandmother       VITAL SIGNS BP (!) 149/70   Pulse 62   Temp (!) 97.5 F (36.4 C) (Oral)   Resp 18   Ht 5\' 4"  (1.626 m)   Wt 139 lb 9.6 oz (63.3 kg)   SpO2 94%   BMI 23.96 kg/m   Outpatient Encounter Medications as of 02/09/2020  Medication Sig  . acetaminophen (TYLENOL) 500 MG tablet Take 1,000 mg by mouth 2 (two) times daily.  Max of 3 grams acetaminophen per 24 hrs from all sources.  Marland Kitchen albuterol (PROVENTIL HFA;VENTOLIN HFA) 108 (90 Base) MCG/ACT inhaler Inhale 2 puffs into the lungs at bedtime. For SOB or wheezing  . ANORO ELLIPTA 62.5-25 MCG/INH AEPB Inhale 1 puff into the lungs daily.   Marland Kitchen apixaban (ELIQUIS) 2.5 MG TABS tablet Take 1 tablet (2.5 mg total) by mouth 2 (two) times daily. Resume 07/20/17  . artificial tears (LACRILUBE) OINT ophthalmic ointment Place 1 application into both eyes at bedtime.  . cycloSPORINE (RESTASIS) 0.05 % ophthalmic emulsion Place 1 drop into both eyes every 12 (twelve) hours.   Marland Kitchen diltiazem (CARDIZEM CD) 180 MG 24 hr capsule TAKE ONE CAPSULE BY MOUTH ONCE DAILY.  . furosemide (LASIX) 20 MG tablet Take 20 mg by mouth daily.   Marland Kitchen gabapentin (NEURONTIN) 100 MG capsule Take 100 mg by mouth 2 (two) times daily.   . Melatonin 5 MG TABS Take 5 mg by mouth at bedtime as needed.   . Menthol, Topical Analgesic, (BIOFREEZE) 4 % GEL Apply 1 application topically 2 (two) times daily. Apply topically to left shoulder and neck for M/S pain   . NON FORMULARY Diet Type:  Regular  . Nutritional Supplements (ENSURE ENLIVE PO) Take 237 mLs by mouth daily.   . OXYGEN Inhale 2 L into the lungs 2 (two) times daily. 3:15pm to 11:15pm 11:15pm to 07:15am  . pantoprazole (PROTONIX) 40 MG tablet Take 40 mg by mouth daily.   Vladimir Faster Glycol-Propyl Glycol (SYSTANE OP) Place 1 drop into both eyes 3 (three) times daily. For dry eyes  Administer 15 minutes after administering Restasis eye drops.  . polyethylene glycol (MIRALAX / GLYCOLAX) packet Take 17 g by mouth daily as needed. For constipation  . traMADol (ULTRAM) 50 MG tablet Take 1 tablet (50 mg total) by mouth 3 (three) times daily.  . [DISCONTINUED] Albuterol (VENTOLIN IN) Inhale into the lungs.     No facility-administered encounter medications on file as of 02/09/2020.     SIGNIFICANT DIAGNOSTIC EXAMS   LABS REVIEWED: PREVIOUS   02-21-19: glucose 83;  bun 34; creat 1.09 k+ 4.0; na++ 141 ca 8.7 03-20-19: glucose 102; bun 17; creat 0.89;  k+ 3.5; na++142 ca 8.7 04-09-19: glucose 94; bun 26; creat 0.95; k+ 3.6; na++ 143; ca 8.5  04-30-19: wbc 8.8 hgb 14.2; hct 4.2; mcv 87.0; plt 205; glucose 93; bun 27; creat 1.08; k+ 3.5; na++ 140; ca  9.0 total bili 0.3; albumin 4.2 urine culture: e-coli: macrobid  07-02-19: glucose 91; bun 29; creat 0.96; k+ 4.3; na++ 141; ca 8.7 11-04-19: wbc 5.2; hgb 13.3; hct 42.1; mcv 92.3 plt 202; glucose 92; bun 20; creat 0.70; k+ 3.9; na++ 142; ca 8.8; liver normal albumin 3.5   NO NEW LABS.   Review of Systems  Constitutional: Negative for malaise/fatigue.  Respiratory: Negative for cough and shortness of breath.   Cardiovascular: Negative for chest pain, palpitations and leg swelling.  Gastrointestinal: Negative for abdominal pain, constipation and heartburn.  Musculoskeletal: Negative for back pain, joint pain and myalgias.  Skin: Negative.   Neurological: Negative for dizziness.  Psychiatric/Behavioral: The patient is not nervous/anxious.      Physical Exam Constitutional:      General: Wendy Perkins is not in acute distress.    Appearance: Wendy Perkins is well-developed. Wendy Perkins is not diaphoretic.  HENT:     Right Ear: Tympanic membrane normal.     Left Ear: Tympanic membrane normal.     Nose: Nose normal.  Eyes:     Conjunctiva/sclera: Conjunctivae normal.  Neck:     Thyroid: No thyromegaly.  Cardiovascular:     Rate and Rhythm: Normal rate and regular rhythm.     Pulses: Normal pulses.     Heart sounds: Normal heart sounds.     Comments: Pace maker  Pulmonary:     Effort: Pulmonary effort is normal. No respiratory distress.     Breath sounds: Normal breath sounds.     Comments: 02 dependent  Abdominal:     General: Bowel sounds are normal. There is no distension.     Palpations: Abdomen is soft.     Tenderness: There is no abdominal tenderness.  Musculoskeletal:     Cervical back: Neck supple.     Right lower  leg: No edema.     Left lower leg: No edema.     Comments: Is able to move all extremities Has limited range of motion in left shoulder.   Lymphadenopathy:     Cervical: No cervical adenopathy.  Skin:    General: Skin is warm and dry.  Neurological:     Mental Status: Wendy Perkins is alert. Mental status is at baseline.  Psychiatric:        Mood and Affect: Mood normal.       ASSESSMENT/ PLAN:  TODAY;   1. Peripheral neuropathy is stable will continue gabapentin 100 mg twice daily   2. CVA (cerebrovascular accident) is stable will continue eliquis 2.5 mg twice daily   3. Complete heart block is status post [employer] PTV/DP pace maker will monitor   4. Dyslipidemia is stable is off lipitor will monitor  5. Chronic constipation: is stable will continue miralax daily as needed  6. Chronic diastolic heart failure EF 60-65% (06-21-17) is stable will continue lasix 20 mg daily   7. Longstanding persistent atrial fibrillation is status post pace maker; will continue cardizem cd 180 mg daily for rate control eliquis 2.5 mg twice daily   8. 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 Weight loss: her weight without change current weight is 139 pounds; 03-06-19: 153 pounds; 05-25-19 137 pounds current weight is 139 pounds  10. Chronic obstructive pulmonary disease is stable 02 dependent will continue anoro 62.5/25 mcg 1 puff daily  11. Chronic kidney disease stage 3 (moderate) is stable bun 20; creat 0.70 will monitor  12. GERD without  esophagitis: is stable will continue protonix 40 mg daily   13. Chronic bilateral low back pain without sciatica is stable will continue tylenol 1 gm twice daily and ultram 50 mg three times daily has biofreeze to neck and left shoulder.   Health maintenance is up to date.    MD is aware  of resident's narcotic use and is in agreement with current plan of care. We will attempt to wean resident as appropriate.  Ok Edwards NP [facility]  Contact [phone] Monday through Friday 8am- 5pm  After hours call [phone]

## 2020-02-17 ENCOUNTER — Encounter: Payer: Self-pay | Admitting: Adult Health

## 2020-02-17 ENCOUNTER — Non-Acute Institutional Stay: Payer: Self-pay | Admitting: Adult Health

## 2020-02-17 DIAGNOSIS — M545 Low back pain: Secondary | ICD-10-CM

## 2020-02-17 DIAGNOSIS — M25512 Pain in left shoulder: Secondary | ICD-10-CM | POA: Diagnosis not present

## 2020-02-17 DIAGNOSIS — G8929 Other chronic pain: Secondary | ICD-10-CM

## 2020-02-17 NOTE — Progress Notes (Signed)
Location:    [facility] Room Number: 108/W Place of Service:  SNF (31)   CODE STATUS: DNR  Allergies  Allergen Reactions  . Sulfur Swelling and Rash    Chief Complaint  Patient presents with  . Medication Management    Medication Review     HPI:  She is presently taking ultram 50 mg three times daily for her pain management. There are no reports of uncontrolled pain. No reports of changes in appetite or constipation.   Past Medical History:  Diagnosis Date  . Atrial fibrillation ([place])   . Bradycardia   . Breast nodule 11/15/2014  . Breast pain, left 11/15/2014  . Carotid artery disease ([place])   . Dizziness   . Dyspnea    Previous CPX suggesting possible restrictive physiology, respiratory muscle fatigue, diastolic dysfunction  . Essential hypertension, benign   . Hyperlipidemia   . Oxygen dependent    2 liter  . Pacemaker [place]   . Rib pain on left side 11/15/2014  . Seasonal allergies   . Shingles 05/04/2015  . Stroke ([place])   . Toe fracture, left    left big toe    Past Surgical History:  Procedure Laterality Date  . COLONOSCOPY  02/22/2012   Procedure: COLONOSCOPY;  Surgeon: Rogene Houston, MD;  [facility];  Service: Endoscopy;  Laterality: N/A;  100  . KNEE SURGERY     bilateral  . PACEMAKER IMPLANT N/A 07/17/2017   Procedure: Pacemaker Implant;  Surgeon: Deboraha Sprang, MD;  Location: [facility];  Service: Cardiovascular;  Laterality: N/A;    Social History   Socioeconomic History  . Marital status: Widowed    Spouse name: Not on file  . Number of children: Not on file  . Years of education: Not on file  . Highest education level: Not on file  Occupational History  . Not on file  Tobacco Use  . Smoking status: Never Smoker  . Smokeless tobacco: Never Used  Substance and Sexual Activity  . Alcohol use: No  . Drug use: No  . Sexual activity: Never    Birth control/protection: Post-menopausal  Other  Topics Concern  . Not on file  Social History Narrative  . Not on file   Social Determinants of Health   Financial Resource Strain:   . Difficulty of Paying Living Expenses: Not on file  Food Insecurity:   . Worried About Charity fundraiser in the Last Year: Not on file  . Ran Out of Food in the Last Year: Not on file  Transportation Needs:   . Lack of Transportation (Medical): Not on file  . Lack of Transportation (Non-Medical): Not on file  Physical Activity:   . Days of Exercise per Week: Not on file  . Minutes of Exercise per Session: Not on file  Stress:   . Feeling of Stress : Not on file  Social Connections:   . Frequency of Communication with Friends and Family: Not on file  . Frequency of Social Gatherings with Friends and Family: Not on file  . Attends Religious Services: Not on file  . Active Member of Clubs or Organizations: Not on file  . Attends Archivist Meetings: Not on file  . Marital Status: Not on file  Intimate Partner Violence:   . Fear of Current or Ex-Partner: Not on file  . Emotionally Abused: Not on file  . Physically Abused: Not on file  .  Sexually Abused: Not on file   Family History  Problem Relation Age of Onset  . Stroke Mother   . Pneumonia Father   . Heart disease Father   . Healthy Son   . Glaucoma Daughter   . Emphysema Daughter   . Healthy Daughter   . Other Daughter        had knee replacement  . Healthy Son   . Heart disease Maternal Grandmother       VITAL SIGNS BP 116/61   Pulse 81   Temp 98 F (36.7 C) (Oral)   Resp 18   Ht 5\' 4"  (1.626 m)   Wt 139 lb 9.6 oz (63.3 kg)   SpO2 94%   BMI 23.96 kg/m   Outpatient Encounter Medications as of 02/17/2020  Medication Sig  . acetaminophen (TYLENOL) 500 MG tablet Take 1,000 mg by mouth 2 (two) times daily. Max of 3 grams acetaminophen per 24 hrs from all sources.  Marland Kitchen albuterol (PROVENTIL HFA;VENTOLIN HFA) 108 (90 Base) MCG/ACT inhaler Inhale 2 puffs into the lungs  at bedtime. For SOB or wheezing  . ANORO ELLIPTA 62.5-25 MCG/INH AEPB Inhale 1 puff into the lungs daily.   Marland Kitchen apixaban (ELIQUIS) 2.5 MG TABS tablet Take 1 tablet (2.5 mg total) by mouth 2 (two) times daily. Resume 07/20/17  . artificial tears (LACRILUBE) OINT ophthalmic ointment Place 1 application into both eyes at bedtime.  . cycloSPORINE (RESTASIS) 0.05 % ophthalmic emulsion Place 1 drop into both eyes every 12 (twelve) hours.   Marland Kitchen diltiazem (CARDIZEM CD) 180 MG 24 hr capsule TAKE ONE CAPSULE BY MOUTH ONCE DAILY.  . furosemide (LASIX) 20 MG tablet Take 20 mg by mouth daily.   Marland Kitchen gabapentin (NEURONTIN) 100 MG capsule Take 100 mg by mouth 2 (two) times daily.   . Melatonin 5 MG TABS Take 5 mg by mouth at bedtime as needed.   . Menthol, Topical Analgesic, (BIOFREEZE) 4 % GEL Apply 1 application topically 2 (two) times daily. Apply topically to left shoulder and neck for M/S pain   . NON FORMULARY Diet Type:  Regular  . Nutritional Supplements (ENSURE ENLIVE PO) Take 237 mLs by mouth daily.   . OXYGEN Inhale 2 L into the lungs 2 (two) times daily. 3:15pm to 11:15pm 11:15pm to 07:15am  . pantoprazole (PROTONIX) 40 MG tablet Take 40 mg by mouth daily.   Vladimir Faster Glycol-Propyl Glycol (SYSTANE OP) Place 1 drop into both eyes 3 (three) times daily. For dry eyes  Administer 15 minutes after administering Restasis eye drops.  . polyethylene glycol (MIRALAX / GLYCOLAX) packet Take 17 g by mouth daily as needed. For constipation  . traMADol (ULTRAM) 50 MG tablet Take 1 tablet (50 mg total) by mouth 3 (three) times daily.  . [DISCONTINUED] Albuterol (VENTOLIN IN) Inhale into the lungs.     No facility-administered encounter medications on file as of 02/17/2020.     SIGNIFICANT DIAGNOSTIC EXAMS   LABS REVIEWED: PREVIOUS   02-21-19: glucose 83; bun 34; creat 1.09 k+ 4.0; na++ 141 ca 8.7 03-20-19: glucose 102; bun 17; creat 0.89;  k+ 3.5; na++142 ca 8.7 04-09-19: glucose 94; bun 26; creat 0.95; k+ 3.6;  na++ 143; ca 8.5  04-30-19: wbc 8.8 hgb 14.2; hct 4.2; mcv 87.0; plt 205; glucose 93; bun 27; creat 1.08; k+ 3.5; na++ 140; ca 9.0 total bili 0.3; albumin 4.2 urine culture: e-coli: macrobid  07-02-19: glucose 91; bun 29; creat 0.96; k+ 4.3; na++ 141; ca 8.7 11-04-19: wbc  5.2; hgb 13.3; hct 42.1; mcv 92.3 plt 202; glucose 92; bun 20; creat 0.70; k+ 3.9; na++ 142; ca 8.8; liver normal albumin 3.5   NO NEW LABS.   Review of Systems  Constitutional: Negative for malaise/fatigue.  Respiratory: Negative for cough and shortness of breath.   Cardiovascular: Negative for chest pain, palpitations and leg swelling.  Gastrointestinal: Negative for abdominal pain, constipation and heartburn.  Musculoskeletal: Negative for back pain, joint pain and myalgias.  Skin: Negative.   Neurological: Negative for dizziness.  Psychiatric/Behavioral: The patient is not nervous/anxious.     Physical Exam Constitutional:      General: She is not in acute distress.    Appearance: She is well-developed. She is not diaphoretic.  Neck:     Thyroid: No thyromegaly.  Cardiovascular:     Rate and Rhythm: Normal rate and regular rhythm.     Pulses: Normal pulses.     Heart sounds: Normal heart sounds.     Comments: Pace maker  Pulmonary:     Effort: Pulmonary effort is normal. No respiratory distress.     Breath sounds: Normal breath sounds.     Comments: 02 dependent  Abdominal:     General: Bowel sounds are normal. There is no distension.     Palpations: Abdomen is soft.     Tenderness: There is no abdominal tenderness.  Musculoskeletal:     Cervical back: Neck supple.     Right lower leg: No edema.     Left lower leg: No edema.  Lymphadenopathy:     Cervical: No cervical adenopathy.  Skin:    General: Skin is warm and dry.  Neurological:     Mental Status: She is alert. Mental status is at baseline.  Psychiatric:        Mood and Affect: Mood normal.       ASSESSMENT/ PLAN:  TODAY  1. Chronic  left shoulder pain 2. Chronic bilateral lower back pain without sciatica  Will continue tylenol 1 gm twice daily ultram 50 mg three times daily and biofreeze to left shoulder.    MD is aware of resident's narcotic use and is in agreement with current plan of care. We will attempt to wean resident as appropriate.  Ok Edwards NP [facility]  Contact [phone] Monday through Friday 8am- 5pm  After hours call [phone]

## 2020-02-29 ENCOUNTER — Other Ambulatory Visit: Payer: Self-pay | Admitting: Adult Health

## 2020-02-29 MED ORDER — TRAMADOL HCL 50 MG PO TABS: 50.0000 mg | ORAL_TABLET | Freq: Three times a day (TID) | ORAL | 0 refills | Status: DC

## 2020-03-01 ENCOUNTER — Non-Acute Institutional Stay (SKILLED_NURSING_FACILITY): Payer: Medicare Other | Admitting: Adult Health

## 2020-03-01 ENCOUNTER — Encounter: Payer: Self-pay | Admitting: Adult Health

## 2020-03-01 DIAGNOSIS — Z950 Presence of cardiac pacemaker: Secondary | ICD-10-CM

## 2020-03-01 DIAGNOSIS — I130 Hypertensive heart and chronic kidney disease with heart failure and stage 1 through stage 4 chronic kidney disease, or unspecified chronic kidney disease: Secondary | ICD-10-CM

## 2020-03-01 DIAGNOSIS — I5032 Chronic diastolic (congestive) heart failure: Secondary | ICD-10-CM

## 2020-03-01 DIAGNOSIS — Z0000 Encounter for general adult medical examination without abnormal findings: Secondary | ICD-10-CM | POA: Diagnosis not present

## 2020-03-01 DIAGNOSIS — N1831 Chronic kidney disease, stage 3a: Secondary | ICD-10-CM

## 2020-03-01 DIAGNOSIS — J449 Chronic obstructive pulmonary disease, unspecified: Secondary | ICD-10-CM

## 2020-03-01 NOTE — Progress Notes (Signed)
Subjective:   Wendy Perkins is a 86 y.o. female who presents for Medicare Annual (Subsequent) preventive examination.long term resident of [facility]   Review of Systems:  Review of Systems  Constitutional: Negative for malaise/fatigue.  Respiratory: Negative for cough and shortness of breath.   Cardiovascular: Negative for chest pain, palpitations and leg swelling.  Gastrointestinal: Negative for abdominal pain, constipation and heartburn.  Musculoskeletal: Negative for back pain, joint pain and myalgias.  Skin: Negative.   Neurological: Negative for dizziness.  Psychiatric/Behavioral: The patient is not nervous/anxious.     Cardiac Risk Factors include: advanced age (>81men, >62 women);sedentary lifestyle;hypertension     Objective:     Vitals: BP (!) 143/66   Pulse 74   Temp 97.6 F (36.4 C) (Oral)   Resp 17   Ht 5\' 4"  (1.626 m)   Wt 139 lb 9.6 oz (63.3 kg)   SpO2 98%   BMI 23.96 kg/m   Body mass index is 23.96 kg/m.  Advanced Directives 03/01/2020 02/17/2020 01/11/2020 12/23/2019 12/11/2019 12/01/2019 11/03/2019  Does Patient Have a Medical Advance Directive? Yes Yes Yes Yes Yes Yes Yes  Type of Advance Directive Out of facility DNR (pink MOST or yellow form) Out of facility DNR (pink MOST or yellow form) Out of facility DNR (pink MOST or yellow form) Out of facility DNR (pink MOST or yellow form) Out of facility DNR (pink MOST or yellow form) Out of facility DNR (pink MOST or yellow form) Out of facility DNR (pink MOST or yellow form)  Does patient want to make changes to medical advance directive? No - Patient declined No - Patient declined No - Patient declined No - Patient declined No - Patient declined No - Patient declined No - Patient declined  Copy of [place] in Tool  Would patient like information on creating a medical advance directive? - - - - - - -  Pre-existing out of facility DNR order (yellow form or pink MOST form) - Yellow form  placed in chart (order not valid for inpatient use) Yellow form placed in chart (order not valid for inpatient use) Yellow form placed in chart (order not valid for inpatient use) Yellow form placed in chart (order not valid for inpatient use) Yellow form placed in chart (order not valid for inpatient use) Yellow form placed in chart (order not valid for inpatient use)    Tobacco Social History   Tobacco Use  Smoking Status Never Smoker  Smokeless Tobacco Never Used     Counseling given: Not Answered   Clinical Intake:  Pre-visit preparation completed: Yes  Pain : No/denies pain     BMI - recorded: 23.96 Nutritional Status: BMI of 19-24  Normal Diabetes: No  How often do you need to have someone help you when you read instructions, pamphlets, or other written materials from your doctor or pharmacy?: 5 - Always  Interpreter Needed?: No     Past Medical History:  Diagnosis Date  . Atrial fibrillation ([place])   . Bradycardia   . Breast nodule 11/15/2014  . Breast pain, left 11/15/2014  . Carotid artery disease ([place])   . Dizziness   . Dyspnea    Previous CPX suggesting possible restrictive physiology, respiratory muscle fatigue, diastolic dysfunction  . Essential hypertension, benign   . Hyperlipidemia   . Oxygen dependent    2 liter  . Pacemaker [place]   . Rib pain on left side 11/15/2014  .  Seasonal allergies   . Shingles 05/04/2015  . Stroke ([place])   . Toe fracture, left    left big toe   Past Surgical History:  Procedure Laterality Date  . COLONOSCOPY  02/22/2012   Procedure: COLONOSCOPY;  Surgeon: Rogene Houston, MD;  [facility];  Service: Endoscopy;  Laterality: N/A;  100  . KNEE SURGERY     bilateral  . PACEMAKER IMPLANT N/A 07/17/2017   Procedure: Pacemaker Implant;  Surgeon: Deboraha Sprang, MD;  Location: [place] [facility];  Service: Cardiovascular;  Laterality: N/A;   Family History  Problem Relation Age of Onset  . Stroke Mother   .  Pneumonia Father   . Heart disease Father   . Healthy Son   . Glaucoma Daughter   . Emphysema Daughter   . Healthy Daughter   . Other Daughter        had knee replacement  . Healthy Son   . Heart disease Maternal Grandmother    Social History   Socioeconomic History  . Marital status: Widowed    Spouse name: Not on file  . Number of children: Not on file  . Years of education: Not on file  . Highest education level: Not on file  Occupational History  . Not on file  Tobacco Use  . Smoking status: Never Smoker  . Smokeless tobacco: Never Used  Substance and Sexual Activity  . Alcohol use: No  . Drug use: No  . Sexual activity: Never    Birth control/protection: Post-menopausal  Other Topics Concern  . Not on file  Social History Narrative  . Not on file   Social Determinants of Health   Financial Resource Strain:   . Difficulty of Paying Living Expenses:   Food Insecurity:   . Worried About Charity fundraiser in the Last Year:   . Arboriculturist in the Last Year:   Transportation Needs:   . Film/video editor (Medical):   Marland Kitchen Lack of Transportation (Non-Medical):   Physical Activity:   . Days of Exercise per Week:   . Minutes of Exercise per Session:   Stress:   . Feeling of Stress :   Social Connections:   . Frequency of Communication with Friends and Family:   . Frequency of Social Gatherings with Friends and Family:   . Attends Religious Services:   . Active Member of Clubs or Organizations:   . Attends Archivist Meetings:   Marland Kitchen Marital Status:     Outpatient Encounter Medications as of 03/01/2020  Medication Sig  . acetaminophen (TYLENOL) 500 MG tablet Take 1,000 mg by mouth 2 (two) times daily. Max of 3 grams acetaminophen per 24 hrs from all sources.  Marland Kitchen albuterol (PROVENTIL HFA;VENTOLIN HFA) 108 (90 Base) MCG/ACT inhaler Inhale 2 puffs into the lungs at bedtime. For SOB or wheezing  . ANORO ELLIPTA 62.5-25 MCG/INH AEPB Inhale 1 puff into  the lungs daily.   Marland Kitchen apixaban (ELIQUIS) 2.5 MG TABS tablet Take 1 tablet (2.5 mg total) by mouth 2 (two) times daily. Resume 07/20/17  . artificial tears (LACRILUBE) OINT ophthalmic ointment Place 1 application into both eyes at bedtime.  . cycloSPORINE (RESTASIS) 0.05 % ophthalmic emulsion Place 1 drop into both eyes every 12 (twelve) hours.   Marland Kitchen diltiazem (CARDIZEM CD) 180 MG 24 hr capsule TAKE ONE CAPSULE BY MOUTH ONCE DAILY.  . furosemide (LASIX) 20 MG tablet Take 20 mg by mouth daily.   Marland Kitchen  gabapentin (NEURONTIN) 100 MG capsule Take 100 mg by mouth 2 (two) times daily.   . hydrocortisone (ANUSOL-HC) 2.5 % rectal cream Place 1 application rectally daily. Apply topically for hemorrhoids as directed  . Melatonin 5 MG TABS Take 5 mg by mouth at bedtime as needed.   . Menthol, Topical Analgesic, (BIOFREEZE) 4 % GEL Apply 1 application topically 2 (two) times daily. Apply topically to left shoulder and neck for M/S pain   . NON FORMULARY Diet Type:  Regular  . Nutritional Supplements (ENSURE ENLIVE PO) Take 237 mLs by mouth daily.   . OXYGEN Inhale 2 L into the lungs 2 (two) times daily. 3:15pm to 11:15pm 11:15pm to 07:15am  . pantoprazole (PROTONIX) 40 MG tablet Take 40 mg by mouth daily.   Vladimir Faster Glycol-Propyl Glycol (SYSTANE OP) Place 1 drop into both eyes 3 (three) times daily. For dry eyes  Administer 15 minutes after administering Restasis eye drops.  . polyethylene glycol (MIRALAX / GLYCOLAX) packet Take 17 g by mouth daily as needed. For constipation  . traMADol (ULTRAM) 50 MG tablet Take 1 tablet (50 mg total) by mouth 3 (three) times daily.  . [DISCONTINUED] Albuterol (VENTOLIN IN) Inhale into the lungs.     No facility-administered encounter medications on file as of 03/01/2020.    Activities of Daily Living In your present state of health, do you have any difficulty performing the following activities: 03/01/2020  Hearing? N  Vision? N  Difficulty concentrating or making  decisions? Y  Walking or climbing stairs? Y  Dressing or bathing? Y  Doing errands, shopping? Y  Preparing Food and eating ? Y  Using the Toilet? Y  In the past six months, have you accidently leaked urine? Y  Do you have problems with loss of bowel control? Y  Managing your Medications? Y  Managing your Finances? Y  Housekeeping or managing your Housekeeping? Y  Some recent data might be hidden    Patient Care Team: Hennie Duos, MD as PCP - General (Internal Medicine) [name] [name], NP as Nurse Practitioner ([place]) [facility] [place] ([place])    Assessment:   This is a routine wellness examination for [place].  Exercise Activities and Dietary recommendations Current Exercise Habits: The patient does not participate in regular exercise at present  Goals    . Follow up with Primary Care Provider       Fall Risk Fall Risk  03/01/2020 02/13/2020 09/11/2018 09/02/2017  Falls in the past year? 1 1 No Yes  Number falls in past yr: 0 0 - 1  Injury with Fall? 0 0 - No  Risk for fall due to : Impaired balance/gait;Impaired mobility History of fall(s);Impaired mobility - -   Is the patient's home free of loose throw rugs in walkways, pet beds, electrical cords, etc?   yes      Grab bars in the bathroom? yes      Handrails on the stairs?   n/a      Adequate lighting?   yes  Timed Get Up and Go performed: unable to perform  Depression Screen PHQ 2/9 Scores 03/01/2020 02/13/2020 09/11/2018 09/02/2017  PHQ - 2 Score 0 0 0 0     Cognitive Function     6CIT Screen 03/01/2020 09/11/2018 09/02/2017  What Year? 0 points 0 points 4 points  What month? - 0 points 3 points  What time? 3 points 0 points 0 points  Count back from 20 2 points  0 points 0 points  Months in reverse 4 points 4 points 4 points  Repeat phrase 4 points 4 points 2 points  Total Score - 8 13    Immunization History  Administered Date(s) Administered  . Influenza-Unspecified  10/10/2013, 09/07/2016, 09/11/2017, 09/11/2018, 09/14/2019  . Moderna SARS-COVID-2 Vaccination 12/16/2019, 01/13/2020  . Pneumococcal Conjugate-13 09/13/2017  . Pneumococcal-Unspecified 09/20/2016  . Tdap 09/16/2017  . Zoster 12/05/2017    Qualifies for Shingles Vaccine?long term resident of SNF  Screening Tests Health Maintenance  Topic Date Due  . TETANUS/TDAP  09/17/2027  . INFLUENZA VACCINE  Completed  . PNA vac Low Risk Adult  Completed  . DEXA SCAN  Discontinued    Cancer Screenings: Lung: Low Dose CT Chest recommended if Age 74-80 years, 30 pack-year currently smoking OR have quit w/in 15years. Patient does not qualify. Breast:  Up to date on Mammogram?   N/a Up to date of Bone Density/Dexa? n/a Colorectal: na/  Additional Screenings: n/a: Hepatitis C Screening:      Plan:     I have personally reviewed and noted the following in the patient's chart:   . Medical and social history . Use of alcohol, tobacco or illicit drugs  . Current medications and supplements . Functional ability and status . Nutritional status . Physical activity . Advanced directives . List of other physicians . Hospitalizations, surgeries, and ER visits in previous 12 months . Vitals . Screenings to include cognitive, depression, and falls . Referrals and appointments  In addition, I have reviewed and discussed with patient certain preventive protocols, quality metrics, and best practice recommendations. A written personalized care plan for preventive services as well as general preventive health recommendations were provided to patient.     Gerlene Fee, NP  03/01/2020

## 2020-03-01 NOTE — Patient Instructions (Signed)
  Wendy Perkins , Thank you for taking time to come for your Medicare Wellness Visit. I appreciate your ongoing commitment to your health goals. Please review the following plan we discussed and let me know if I can assist you in the future.   These are the goals we discussed: Goals    . Follow up with Primary Care Provider       This is a list of the screening recommended for you and due dates:  Health Maintenance  Topic Date Due  . Tetanus Vaccine  09/17/2027  . Flu Shot  Completed  . Pneumonia vaccines  Completed  . DEXA scan (bone density measurement)  Discontinued

## 2020-03-16 ENCOUNTER — Non-Acute Institutional Stay (SKILLED_NURSING_FACILITY): Payer: Medicare Other | Admitting: Internal Medicine

## 2020-03-16 ENCOUNTER — Encounter: Payer: Self-pay | Admitting: Internal Medicine

## 2020-03-16 DIAGNOSIS — M25512 Pain in left shoulder: Secondary | ICD-10-CM

## 2020-03-16 DIAGNOSIS — G588 Other specified mononeuropathies: Secondary | ICD-10-CM | POA: Diagnosis not present

## 2020-03-16 DIAGNOSIS — K219 Gastro-esophageal reflux disease without esophagitis: Secondary | ICD-10-CM

## 2020-03-16 DIAGNOSIS — G8929 Other chronic pain: Secondary | ICD-10-CM

## 2020-03-16 NOTE — Progress Notes (Signed)
Location:  [facility] Room Number: 108-W Place of Service:  SNF (31)  Wendy Duos, MD  Patient Care Team: Wendy Duos, MD as PCP - General (Internal Medicine) Wendy Perkins Wendy Bougie, NP as Nurse Practitioner ([place]) [facility] [place] ([place])  Extended Emergency Contact Information Primary Emergency Contact: Wendy, [place] [address] Wendy Perkins of [place] Phone: [phone] Relation: Son Secondary Emergency Contact: Wendy Perkins, [place] [address] Wendy Perkins of [place] Phone: [phone] Mobile Phone: [phone] Relation: Son    Allergies: Sulfur  Chief Complaint  Patient presents with  . Medical Management of Chronic Issues    Routine [place] visit    HPI: Patient is a 86 y.o. female who is being seen for routine issues of left shoulder pain, peripheral neuropathy, and GERD.  Past Medical History:  Diagnosis Date  . Atrial fibrillation ([place])   . Bradycardia   . Breast nodule 11/15/2014  . Breast pain, left 11/15/2014  . Carotid artery disease ([place])   . Dizziness   . Dyspnea    Previous CPX suggesting possible restrictive physiology, respiratory muscle fatigue, diastolic dysfunction  . Essential hypertension, benign   . Hyperlipidemia   . Oxygen dependent    2 liter  . Pacemaker [place]   . Rib pain on left side 11/15/2014  . Seasonal allergies   . Shingles 05/04/2015  . Stroke ([place])   . Toe fracture, left    left big toe    Past Surgical History:  Procedure Laterality Date  . COLONOSCOPY  02/22/2012   Procedure: COLONOSCOPY;  Surgeon: Rogene Houston, MD;  [facility];  Service: Endoscopy;  Laterality: N/A;  100  . KNEE SURGERY     bilateral  . PACEMAKER IMPLANT N/A 07/17/2017   Procedure: Pacemaker Implant;  Surgeon: Deboraha Sprang, MD;  Location: [place] [facility] LAB;  Service: Cardiovascular;  Laterality: N/A;     Allergies as of 03/16/2020      Reactions   Sulfur Swelling, Rash      Medication List    Notice   This visit is during an admission. Changes to the med list made in this visit will be reflected in the After Visit Summary of the admission.    Current Outpatient Medications on File Prior to Visit  Medication Sig Dispense Refill  . acetaminophen (TYLENOL) 500 MG tablet Take 1,000 mg by mouth 2 (two) times daily. Max of 3 grams acetaminophen per 24 hrs from all sources.    Marland Kitchen albuterol (PROVENTIL HFA;VENTOLIN HFA) 108 (90 Base) MCG/ACT inhaler Inhale 2 puffs into the lungs at bedtime. For SOB or wheezing    . ANORO ELLIPTA 62.5-25 MCG/INH AEPB Inhale 1 puff into the lungs daily.     Marland Kitchen apixaban (ELIQUIS) 2.5 MG TABS tablet Take 1 tablet (2.5 mg total) by mouth 2 (two) times daily. Resume 07/20/17 60 tablet   . artificial tears (LACRILUBE) OINT ophthalmic ointment Place 1 application into both eyes at bedtime.    . cycloSPORINE (RESTASIS) 0.05 % ophthalmic emulsion Place 1 drop into both eyes every 12 (twelve) hours.     Marland Kitchen diltiazem (CARDIZEM CD) 180 MG 24 hr capsule TAKE ONE CAPSULE BY MOUTH ONCE DAILY. 30 capsule 6  . furosemide (LASIX) 20 MG tablet Take 20 mg by mouth daily.     Marland Kitchen gabapentin (NEURONTIN) 100  MG capsule Take 100 mg by mouth 2 (two) times daily.     . hydrocortisone (ANUSOL-HC) 2.5 % rectal cream Place 1 application rectally daily. Apply topically for hemorrhoids as directed    . Melatonin 5 MG TABS Take 5 mg by mouth at bedtime as needed.     . Menthol, Topical Analgesic, (BIOFREEZE) 4 % GEL Apply 1 application topically 2 (two) times daily. Apply topically to left shoulder and neck for M/S pain     . NON FORMULARY Diet Type:  Regular    . Nutritional Supplements (ENSURE ENLIVE PO) Take 237 mLs by mouth daily.     . OXYGEN Inhale 2 L into the lungs 2 (two) times daily. 3:15pm to 11:15pm 11:15pm to 07:15am    . pantoprazole (PROTONIX) 40 MG tablet Take 40 mg by mouth daily.      Vladimir Faster Glycol-Propyl Glycol (SYSTANE OP) Place 1 drop into both eyes 3 (three) times daily. For dry eyes  Administer 15 minutes after administering Restasis eye drops.    . polyethylene glycol (MIRALAX / GLYCOLAX) packet Take 17 g by mouth daily as needed. For constipation    . traMADol (ULTRAM) 50 MG tablet Take 1 tablet (50 mg total) by mouth 3 (three) times daily. 90 tablet 0  . [DISCONTINUED] Albuterol (VENTOLIN IN) Inhale into the lungs.       No current facility-administered medications on file prior to visit.     No orders of the defined types were placed in this encounter.   Immunization History  Administered Date(s) Administered  . Influenza-Unspecified 10/10/2013, 09/07/2016, 09/11/2017, 09/11/2018, 09/14/2019  . Moderna SARS-COVID-2 Vaccination 12/16/2019, 01/13/2020  . Pneumococcal Conjugate-13 09/13/2017  . Pneumococcal-Unspecified 09/20/2016  . Tdap 09/16/2017  . Zoster 12/05/2017    Social History   Tobacco Use  . Smoking status: Never Smoker  . Smokeless tobacco: Never Used  Substance Use Topics  . Alcohol use: No    Review of Systems  GENERAL:  no fevers, fatigue, appetite changes SKIN: No itching, rash HEENT: No complaint RESPIRATORY: No cough, wheezing, SOB CARDIAC: No chest pain, palpitations, lower extremity edema  GI: No abdominal pain, No N/V/D or constipation, No heartburn or reflux  GU: No dysuria, frequency or urgency, or incontinence  MUSCULOSKELETAL: Chronic shoulder and back pain NEUROLOGIC: No headache, dizziness  PSYCHIATRIC: No overt anxiety or sadness  Vitals:   03/16/20 1530  BP: 119/70  Pulse: 60  Resp: 18  Temp: 97.6 F (36.4 C)  SpO2: 94%   Body mass index is 24.24 kg/m. Physical Exam  GENERAL APPEARANCE: Alert, conversant, No acute distress  SKIN: No diaphoresis rash HEENT: Unremarkable RESPIRATORY: Breathing is even, unlabored. Lung sounds are clear but diffusely decreased; O2 dependent CARDIOVASCULAR: Heart  RRR no murmurs, rubs or gallops. No peripheral edema  GASTROINTESTINAL: Abdomen is soft, non-tender, not distended w/ normal bowel sounds.  GENITOURINARY: Bladder non tender, not distended  MUSCULOSKELETAL: Decreased range of motion left shoulder NEUROLOGIC: Cranial nerves 2-12 grossly intact. Moves all extremities PSYCHIATRIC: Mood and affect appropriate to situation, no behavioral issues  Patient Active Problem List   Diagnosis Date Noted  . Left shoulder pain 08/09/2019  . Weight loss 05/06/2019  . 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 Stage 3 chronic kidney disease 01/27/2019  . GERD without esophagitis 01/27/2019  . Chronic constipation 01/27/2019  . Peripheral neuropathy 01/27/2019  . Pacemaker 04/01/2018  . Sinus node dysfunction (  [place]) 07/17/2017  . Complete heart block ([place]) 07/14/2017  . Ventricular escape rhythm   . Retinal vein occlusion of right eye 05/14/2017  . Internal carotid artery stenosis, bilateral 03/07/2017  . Back pain 03/07/2017  . CVA (cerebral vascular accident) ([place]) 10/17/2015  . Chronic obstructive pulmonary disease (COPD) ([place]) 10/17/2015  . Orthostatic hypotension 09/30/2013  . Atrial fibrillation ([place]) 11/23/2011  . Chronic diastolic heart failure ([place]) 11/23/2011  . Essential hypertension 07/13/2010    CMP     Component Value Date/Time   NA 142 11/04/2019 0700   K 3.9 11/04/2019 0700   CL 106 11/04/2019 0700   CO2 25 11/04/2019 0700   GLUCOSE 92 11/04/2019 0700   BUN 20 11/04/2019 0700   CREATININE 0.70 11/04/2019 0700   CREATININE 1.23 (H) 01/16/2012 1425   CALCIUM 8.8 (L) 11/04/2019 0700   PROT 6.0 (L) 11/04/2019 0700   ALBUMIN 3.5 11/04/2019 0700   AST 19 11/04/2019 0700   ALT 15 11/04/2019 0700   ALKPHOS 81 11/04/2019 0700   BILITOT 0.6 11/04/2019 0700   GFRNONAA >60 11/04/2019 0700   GFRAA >60 11/04/2019 0700   Recent Labs     04/30/19 1300 07/02/19 0734 11/04/19 0700  NA 140 141 142  K 3.5 4.3 3.9  CL 101 106 106  CO2 23 26 25   GLUCOSE 93 91 92  BUN 27* 29* 20  CREATININE 1.08* 0.96 0.70  CALCIUM 9.0 8.7* 8.8*   Recent Labs    04/30/19 1300 11/04/19 0700  AST 33 19  ALT 21 15  ALKPHOS 108 81  BILITOT 1.2 0.6  PROT 7.0 6.0*  ALBUMIN 4.2 3.5   Recent Labs    04/30/19 1300 11/04/19 0700  WBC 8.8 5.2  NEUTROABS 7.0  --   HGB 14.2 13.3  HCT 44.2 42.1  MCV 87.0 92.3  PLT 205 202   No results for input(s): CHOL, LDLCALC, TRIG in the last 8760 hours.  Invalid input(s): HCL No results found for: [facility] Lab Results  Component Value Date   TSH 1.446 07/15/2017   Lab Results  Component Value Date   HGBA1C 5.5 12/06/2016   Lab Results  Component Value Date   CHOL 113 12/07/2017   HDL 56 12/07/2017   LDLCALC 48 12/07/2017   TRIG 47 12/07/2017   CHOLHDL 2.0 12/07/2017    Significant Diagnostic Results in last 30 days:  No results found.  Assessment and Plan  Left shoulder pain Chronic and stable; continue Tylenol 1000 mg twice daily and tramadol 50 mg 3 times daily  Peripheral neuropathy Chronic and stable; continue Neurontin 100 mg mg twice daily  GERD without esophagitis Stable; continue Protonix 40 mg daily     Wendy Duos, MD

## 2020-03-17 ENCOUNTER — Encounter: Payer: Self-pay | Admitting: Adult Health

## 2020-03-17 ENCOUNTER — Non-Acute Institutional Stay (SKILLED_NURSING_FACILITY): Payer: Medicare Other | Admitting: Adult Health

## 2020-03-17 DIAGNOSIS — I639 Cerebral infarction, unspecified: Secondary | ICD-10-CM | POA: Diagnosis not present

## 2020-03-17 DIAGNOSIS — I5032 Chronic diastolic (congestive) heart failure: Secondary | ICD-10-CM | POA: Diagnosis not present

## 2020-03-17 DIAGNOSIS — J449 Chronic obstructive pulmonary disease, unspecified: Secondary | ICD-10-CM | POA: Diagnosis not present

## 2020-03-17 DIAGNOSIS — N1832 Chronic kidney disease, stage 3b: Secondary | ICD-10-CM

## 2020-03-17 DIAGNOSIS — I130 Hypertensive heart and chronic kidney disease with heart failure and stage 1 through stage 4 chronic kidney disease, or unspecified chronic kidney disease: Secondary | ICD-10-CM

## 2020-03-17 NOTE — Progress Notes (Signed)
Location:    [facility] Room Number: 108/W Place of Service:  SNF (31)   CODE STATUS: DNR  Allergies  Allergen Reactions  . Sulfur Swelling and Rash    Chief Complaint  Patient presents with  . Acute Visit    care plan meeting      HPI:  We have come together for her care plan meeting. BIMS 13/15 mood 3/30. Her weight is stable at 141 pounds appetite is good at 75-100%. She has had 2 falls without injury. There are no reports of uncontrolled pain. No reports of agitation. She continues to be followed for her chronic illnesses including: Hypertensive heart and kidney disease with chronic diastolic congestive heart failure and stage 3b chronic kidney disease  Chronic obstructive pulmonary disease unspecified COPD type  Cerebral vascular accident unspecified mechanism  Past Medical History:  Diagnosis Date  . Atrial fibrillation ([place])   . Bradycardia   . Breast nodule 11/15/2014  . Breast pain, left 11/15/2014  . Carotid artery disease ([name])   . Dizziness   . Dyspnea    Previous CPX suggesting possible restrictive physiology, respiratory muscle fatigue, diastolic dysfunction  . Essential hypertension, benign   . Hyperlipidemia   . Oxygen dependent    2 liter  . Pacemaker [place]   . Rib pain on left side 11/15/2014  . Seasonal allergies   . Shingles 05/04/2015  . Stroke ([place])   . Toe fracture, left    left big toe    Past Surgical History:  Procedure Laterality Date  . COLONOSCOPY  02/22/2012   Procedure: COLONOSCOPY;  Surgeon: Rogene Houston, MD;  [facility];  Service: Endoscopy;  Laterality: N/A;  100  . KNEE SURGERY     bilateral  . PACEMAKER IMPLANT N/A 07/17/2017   Procedure: Pacemaker Implant;  Surgeon: Deboraha Sprang, MD;  Location: [facility];  Service: Cardiovascular;  Laterality: N/A;    Social History   Socioeconomic History  . Marital status: Widowed    Spouse name: Not on file  . Number of children: Not on  file  . Years of education: Not on file  . Highest education level: Not on file  Occupational History  . Not on file  Tobacco Use  . Smoking status: Never Smoker  . Smokeless tobacco: Never Used  Substance and Sexual Activity  . Alcohol use: No  . Drug use: No  . Sexual activity: Never    Birth control/protection: Post-menopausal  Other Topics Concern  . Not on file  Social History Narrative  . Not on file   Social Determinants of Health   Financial Resource Strain:   . Difficulty of Paying Living Expenses:   Food Insecurity:   . Worried About Charity fundraiser in the Last Year:   . Arboriculturist in the Last Year:   Transportation Needs:   . Film/video editor (Medical):   Marland Kitchen Lack of Transportation (Non-Medical):   Physical Activity:   . Days of Exercise per Week:   . Minutes of Exercise per Session:   Stress:   . Feeling of Stress :   Social Connections:   . Frequency of Communication with Friends and Family:   . Frequency of Social Gatherings with Friends and Family:   . Attends Religious Services:   . Active Member of Clubs or Organizations:   . Attends Archivist Meetings:   Marland Kitchen Marital Status:   Intimate Production manager  Violence:   . Fear of Current or Ex-Partner:   . Emotionally Abused:   Marland Kitchen Physically Abused:   . Sexually Abused:    Family History  Problem Relation Age of Onset  . Stroke Mother   . Pneumonia Father   . Heart disease Father   . Healthy Son   . Glaucoma Daughter   . Emphysema Daughter   . Healthy Daughter   . Other Daughter        had knee replacement  . Healthy Son   . Heart disease Maternal Grandmother       VITAL SIGNS BP 119/75   Pulse 61   Temp 97.7 F (36.5 C) (Oral)   Resp 20   Ht 5\' 4"  (1.626 m)   Wt 141 lb 3.2 oz (64 kg)   SpO2 94%   BMI 24.24 kg/m   Outpatient Encounter Medications as of 03/17/2020  Medication Sig  . acetaminophen (TYLENOL) 500 MG tablet Take 1,000 mg by mouth 2 (two) times daily. Max of  3 grams acetaminophen per 24 hrs from all sources.  Marland Kitchen albuterol (PROVENTIL HFA;VENTOLIN HFA) 108 (90 Base) MCG/ACT inhaler Inhale 2 puffs into the lungs at bedtime. For SOB or wheezing  . ANORO ELLIPTA 62.5-25 MCG/INH AEPB Inhale 1 puff into the lungs daily.   Marland Kitchen apixaban (ELIQUIS) 2.5 MG TABS tablet Take 1 tablet (2.5 mg total) by mouth 2 (two) times daily. Resume 07/20/17  . artificial tears (LACRILUBE) OINT ophthalmic ointment Place 1 application into both eyes at bedtime.  . cycloSPORINE (RESTASIS) 0.05 % ophthalmic emulsion Place 1 drop into both eyes every 12 (twelve) hours.   Marland Kitchen diltiazem (CARDIZEM CD) 180 MG 24 hr capsule TAKE ONE CAPSULE BY MOUTH ONCE DAILY.  . furosemide (LASIX) 20 MG tablet Take 20 mg by mouth daily.   Marland Kitchen gabapentin (NEURONTIN) 100 MG capsule Take 100 mg by mouth 2 (two) times daily.   . hydrocortisone (ANUSOL-HC) 2.5 % rectal cream Place 1 application rectally daily. Apply topically for hemorrhoids as directed  . Melatonin 5 MG TABS Take 5 mg by mouth at bedtime as needed.   . Menthol, Topical Analgesic, (BIOFREEZE) 4 % GEL Apply 1 application topically 2 (two) times daily. Apply topically to left shoulder and neck for M/S pain   . NON FORMULARY Diet Type:  Regular  . Nutritional Supplements (ENSURE ENLIVE PO) Take 237 mLs by mouth daily.   . OXYGEN Inhale 2 L into the lungs 2 (two) times daily. 3:15pm to 11:15pm 11:15pm to 07:15am  . pantoprazole (PROTONIX) 40 MG tablet Take 40 mg by mouth daily.   Vladimir Faster Glycol-Propyl Glycol (SYSTANE OP) Place 1 drop into both eyes 3 (three) times daily. For dry eyes  Administer 15 minutes after administering Restasis eye drops.  . polyethylene glycol (MIRALAX / GLYCOLAX) packet Take 17 g by mouth daily as needed. For constipation  . traMADol (ULTRAM) 50 MG tablet Take 1 tablet (50 mg total) by mouth 3 (three) times daily.  . [DISCONTINUED] Albuterol (VENTOLIN IN) Inhale into the lungs.     No facility-administered encounter  medications on file as of 03/17/2020.     SIGNIFICANT DIAGNOSTIC EXAMS  LABS REVIEWED: PREVIOUS   03-20-19: glucose 102; bun 17; creat 0.89;  k+ 3.5; na++142 ca 8.7 04-09-19: glucose 94; bun 26; creat 0.95; k+ 3.6; na++ 143; ca 8.5  04-30-19: wbc 8.8 hgb 14.2; hct 4.2; mcv 87.0; plt 205; glucose 93; bun 27; creat 1.08; k+ 3.5; na++ 140; ca 9.0  total bili 0.3; albumin 4.2 urine culture: e-coli: macrobid  07-02-19: glucose 91; bun 29; creat 0.96; k+ 4.3; na++ 141; ca 8.7 11-04-19: wbc 5.2; hgb 13.3; hct 42.1; mcv 92.3 plt 202; glucose 92; bun 20; creat 0.70; k+ 3.9; na++ 142; ca 8.8; liver normal albumin 3.5   NO NEW LABS.    Review of Systems  Constitutional: Negative for malaise/fatigue.  Respiratory: Negative for cough and shortness of breath.   Cardiovascular: Negative for chest pain, palpitations and leg swelling.  Gastrointestinal: Negative for abdominal pain, constipation and heartburn.  Musculoskeletal: Negative for back pain, joint pain and myalgias.  Skin: Negative.   Neurological: Negative for dizziness.  Psychiatric/Behavioral: The patient is not nervous/anxious.     Physical Exam Constitutional:      General: She is not in acute distress.    Appearance: She is well-developed. She is not diaphoretic.  Neck:     Thyroid: No thyromegaly.  Cardiovascular:     Rate and Rhythm: Normal rate and regular rhythm.     Pulses: Normal pulses.     Heart sounds: Normal heart sounds.     Comments: Pace maker Pulmonary:     Effort: Pulmonary effort is normal. No respiratory distress.     Breath sounds: Normal breath sounds.     Comments: 02 dependent  Abdominal:     General: Bowel sounds are normal. There is no distension.     Palpations: Abdomen is soft.     Tenderness: There is no abdominal tenderness.  Musculoskeletal:        General: Normal range of motion.     Cervical back: Neck supple.  Lymphadenopathy:     Cervical: No cervical adenopathy.  Skin:    General: Skin is  warm and dry.  Neurological:     Mental Status: She is alert. Mental status is at baseline.  Psychiatric:        Mood and Affect: Mood normal.       ASSESSMENT/ PLAN:  TODAY  1. Hypertensive heart and kidney disease with chronic diastolic congestive heart failure and stage 3b chronic kidney disease  2. Chronic obstructive pulmonary disease unspecified COPD type  3. Cerebral vascular accident unspecified mechanism  Will continue current plan of care Will continue current medications Will continue to monitor his status.   MD is aware of resident's narcotic use and is in agreement with current plan of care. We will attempt to wean resident as appropriate.  Ok Edwards NP [facility]  Contact [phone] Monday through Friday 8am- 5pm  After hours call [phone]

## 2020-03-21 ENCOUNTER — Encounter: Payer: Self-pay | Admitting: Internal Medicine

## 2020-03-21 NOTE — Assessment & Plan Note (Signed)
Stable continue Protonix 40 mg daily.  ?

## 2020-03-21 NOTE — Assessment & Plan Note (Signed)
Chronic and stable; continue Tylenol 1000 mg twice daily and tramadol 50 mg 3 times daily

## 2020-03-21 NOTE — Assessment & Plan Note (Signed)
Chronic and stable; continue Neurontin 100 mg mg twice daily

## 2020-03-28 ENCOUNTER — Other Ambulatory Visit: Payer: Self-pay | Admitting: Adult Health

## 2020-03-28 MED ORDER — TRAMADOL HCL 50 MG PO TABS: 50.0000 mg | ORAL_TABLET | Freq: Three times a day (TID) | ORAL | 0 refills | Status: DC

## 2020-04-14 ENCOUNTER — Non-Acute Institutional Stay (SKILLED_NURSING_FACILITY): Payer: Medicare Other | Admitting: Adult Health

## 2020-04-14 ENCOUNTER — Encounter: Payer: Self-pay | Admitting: Adult Health

## 2020-04-14 DIAGNOSIS — I442 Atrioventricular block, complete: Secondary | ICD-10-CM | POA: Diagnosis not present

## 2020-04-14 DIAGNOSIS — I639 Cerebral infarction, unspecified: Secondary | ICD-10-CM

## 2020-04-14 DIAGNOSIS — G588 Other specified mononeuropathies: Secondary | ICD-10-CM | POA: Diagnosis not present

## 2020-04-14 NOTE — Progress Notes (Signed)
Location:    [facility] Room Number: 108/W Place of Service:  SNF (31)   CODE STATUS: DNR  Allergies  Allergen Reactions  . Sulfur Swelling and Rash    Chief Complaint  Patient presents with  . Medical Management of Chronic Issues       Peripheral neuropathy  CVA (cerebrovascular accident)  Complete heart block     HPI:  She is a 86 year old long term resident of this facility being seen for the management of her chronic illnesses: peripheral neuropathy; cva; complete heart block.  There are no reports of uncontrolled pain. Her weight is stable at this time. No reports of anxiety or agitation.   Past Medical History:  Diagnosis Date  . Atrial fibrillation ([place])   . Bradycardia   . Breast nodule 11/15/2014  . Breast pain, left 11/15/2014  . Carotid artery disease ([place])   . Dizziness   . Dyspnea    Previous CPX suggesting possible restrictive physiology, respiratory muscle fatigue, diastolic dysfunction  . Essential hypertension, benign   . Hyperlipidemia   . Oxygen dependent    2 liter  . Pacemaker [place]   . Rib pain on left side 11/15/2014  . Seasonal allergies   . Shingles 05/04/2015  . Stroke ([place])   . Toe fracture, left    left big toe    Past Surgical History:  Procedure Laterality Date  . COLONOSCOPY  02/22/2012   Procedure: COLONOSCOPY;  Surgeon: Rogene Houston, MD;  [facility];  Service: Endoscopy;  Laterality: N/A;  100  . KNEE SURGERY     bilateral  . PACEMAKER IMPLANT N/A 07/17/2017   Procedure: Pacemaker Implant;  Surgeon: Deboraha Sprang, MD;  Location: [facility] LAB;  Service: Cardiovascular;  Laterality: N/A;    Social History   Socioeconomic History  . Marital status: Widowed    Spouse name: Not on file  . Number of children: Not on file  . Years of education: Not on file  . Highest education level: Not on file  Occupational History  . Not on file  Tobacco Use  . Smoking status: Never Smoker  .  Smokeless tobacco: Never Used  Substance and Sexual Activity  . Alcohol use: No  . Drug use: No  . Sexual activity: Never    Birth control/protection: Post-menopausal  Other Topics Concern  . Not on file  Social History Narrative  . Not on file   Social Determinants of Health   Financial Resource Strain:   . Difficulty of Paying Living Expenses:   Food Insecurity:   . Worried About Charity fundraiser in the Last Year:   . Arboriculturist in the Last Year:   Transportation Needs:   . Film/video editor (Medical):   Marland Kitchen Lack of Transportation (Non-Medical):   Physical Activity:   . Days of Exercise per Week:   . Minutes of Exercise per Session:   Stress:   . Feeling of Stress :   Social Connections:   . Frequency of Communication with Friends and Family:   . Frequency of Social Gatherings with Friends and Family:   . Attends Religious Services:   . Active Member of Clubs or Organizations:   . Attends Archivist Meetings:   Marland Kitchen Marital Status:   Intimate Partner Violence:   . Fear of Current or Ex-Partner:   . Emotionally Abused:   Marland Kitchen Physically Abused:   . Sexually  Abused:    Family History  Problem Relation Age of Onset  . Stroke Mother   . Pneumonia Father   . Heart disease Father   . Healthy Son   . Glaucoma Daughter   . Emphysema Daughter   . Healthy Daughter   . Other Daughter        had knee replacement  . Healthy Son   . Heart disease Maternal Grandmother       VITAL SIGNS BP 134/74   Pulse 76   Temp 98.6 F (37 C) (Oral)   Resp 20   Ht 5\' 4"  (1.626 m)   Wt 139 lb 3.2 oz (63.1 kg)   SpO2 96%   BMI 23.89 kg/m   Outpatient Encounter Medications as of 04/14/2020  Medication Sig  . acetaminophen (TYLENOL) 500 MG tablet Take 1,000 mg by mouth 2 (two) times daily. Max of 3 grams acetaminophen per 24 hrs from all sources.  Marland Kitchen albuterol (PROVENTIL HFA;VENTOLIN HFA) 108 (90 Base) MCG/ACT inhaler Inhale 2 puffs into the lungs at bedtime. For  SOB or wheezing  . ANORO ELLIPTA 62.5-25 MCG/INH AEPB Inhale 1 puff into the lungs daily.   Marland Kitchen apixaban (ELIQUIS) 2.5 MG TABS tablet Take 1 tablet (2.5 mg total) by mouth 2 (two) times daily. Resume 07/20/17  . artificial tears (LACRILUBE) OINT ophthalmic ointment Place 1 application into both eyes at bedtime.  . cycloSPORINE (RESTASIS) 0.05 % ophthalmic emulsion Place 1 drop into both eyes every 12 (twelve) hours.   Marland Kitchen diltiazem (CARDIZEM CD) 180 MG 24 hr capsule TAKE ONE CAPSULE BY MOUTH ONCE DAILY.  . furosemide (LASIX) 20 MG tablet Take 20 mg by mouth daily.   Marland Kitchen gabapentin (NEURONTIN) 100 MG capsule Take 100 mg by mouth 2 (two) times daily.   . hydrocortisone (ANUSOL-HC) 2.5 % rectal cream Place 1 application rectally daily. Apply topically for hemorrhoids as directed  . Melatonin 5 MG TABS Take 5 mg by mouth at bedtime as needed.   . Menthol, Topical Analgesic, (BIOFREEZE) 4 % GEL Apply 1 application topically 2 (two) times daily. Apply topically to left shoulder and neck for M/S pain   . NON FORMULARY Diet Type:  Regular  . Nutritional Supplements (ENSURE ENLIVE PO) Take 237 mLs by mouth daily.   . OXYGEN Inhale 2 L into the lungs 2 (two) times daily. 3:15pm to 11:15pm 11:15pm to 07:15am  . pantoprazole (PROTONIX) 40 MG tablet Take 40 mg by mouth daily.   Vladimir Faster Glycol-Propyl Glycol (SYSTANE OP) Place 1 drop into both eyes 2 (two) times daily. For dry eyes  Administer 15 minutes after administering Restasis eye drops.  . polyethylene glycol (MIRALAX / GLYCOLAX) packet Take 17 g by mouth daily as needed. For constipation  . traMADol (ULTRAM) 50 MG tablet Take 1 tablet (50 mg total) by mouth 3 (three) times daily.  . [DISCONTINUED] Albuterol (VENTOLIN IN) Inhale into the lungs.     No facility-administered encounter medications on file as of 04/14/2020.     SIGNIFICANT DIAGNOSTIC EXAMS   LABS REVIEWED: PREVIOUS   04-30-19: wbc 8.8 hgb 14.2; hct 4.2; mcv 87.0; plt 205; glucose 93; bun  27; creat 1.08; k+ 3.5; na++ 140; ca 9.0 total bili 0.3; albumin 4.2 urine culture: e-coli: macrobid  07-02-19: glucose 91; bun 29; creat 0.96; k+ 4.3; na++ 141; ca 8.7 11-04-19: wbc 5.2; hgb 13.3; hct 42.1; mcv 92.3 plt 202; glucose 92; bun 20; creat 0.70; k+ 3.9; na++ 142; ca 8.8; liver normal albumin  3.5   NO NEW LABS.    Review of Systems  Constitutional: Negative for malaise/fatigue.  Respiratory: Negative for cough and shortness of breath.   Cardiovascular: Negative for chest pain, palpitations and leg swelling.  Gastrointestinal: Negative for abdominal pain, constipation and heartburn.  Musculoskeletal: Negative for back pain, joint pain and myalgias.  Skin: Negative.   Neurological: Negative for dizziness.  Psychiatric/Behavioral: The patient is not nervous/anxious.       Physical Exam Constitutional:      General: She is not in acute distress.    Appearance: She is well-developed. She is not diaphoretic.  Neck:     Thyroid: No thyromegaly.  Cardiovascular:     Rate and Rhythm: Normal rate and regular rhythm.     Pulses: Normal pulses.     Heart sounds: Normal heart sounds.     Comments: Pace maker  Pulmonary:     Effort: Pulmonary effort is normal. No respiratory distress.     Breath sounds: Normal breath sounds.     Comments: 02 dependent  Abdominal:     General: Bowel sounds are normal. There is no distension.     Palpations: Abdomen is soft.     Tenderness: There is no abdominal tenderness.  Musculoskeletal:        General: Normal range of motion.     Cervical back: Neck supple.     Right lower leg: Edema present.     Left lower leg: Edema present.     Comments: Limited range of motion to left shoudler   Lymphadenopathy:     Cervical: No cervical adenopathy.  Skin:    General: Skin is warm and dry.  Neurological:     Mental Status: She is alert. Mental status is at baseline.  Psychiatric:        Mood and Affect: Mood normal.      ASSESSMENT/  PLAN:  TODAY;   1. Peripheral neuropathy is stable will continue gabapentin 100 mg twice daily   2. CVA (cerebrovascular accident) is stable will continue eliquis 2.5 mg twice daily   3. Complete heart block is status post [employer] PTV/DP pace maker will monitor   PREVIOUS  4. Dyslipidemia is stable is off lipitor will monitor  5. Chronic constipation: is stable will continue miralax daily as needed  6. Chronic diastolic heart failure EF 60-65% (06-21-17) is stable will continue lasix 20 mg daily   7. Longstanding persistent atrial fibrillation is status post pace maker; will continue cardizem cd 180 mg daily for rate control eliquis 2.5 mg twice daily   8. 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 Weight loss: her weight without change current weight is 139 pounds; 03-06-19: 153 pounds; 05-25-19 137 pounds current weight is 139 pounds  10. Chronic obstructive pulmonary disease is stable 02 dependent will continue anoro 62.5/25 mcg 1 puff daily   11. Chronic kidney disease stage 3 (moderate) is stable bun 20; creat 0.70 will monitor  12. GERD without  esophagitis: is stable will continue protonix 40 mg daily   13. Chronic bilateral low back pain without sciatica is stable will continue tylenol 1 gm twice daily and ultram 50 mg three times daily has biofreeze to neck and left shoulder.    MD is aware of resident's narcotic use and is in agreement with current  plan of care. We will attempt to wean resident as appropriate.  Ok Edwards NP [facility]  Contact [phone] Monday through Friday 8am- 5pm  After hours call [phone]

## 2020-04-18 ENCOUNTER — Non-Acute Institutional Stay (SKILLED_NURSING_FACILITY): Payer: Medicare Other | Admitting: Adult Health

## 2020-04-18 ENCOUNTER — Encounter: Payer: Self-pay | Admitting: Adult Health

## 2020-04-18 DIAGNOSIS — I5032 Chronic diastolic (congestive) heart failure: Secondary | ICD-10-CM

## 2020-04-18 NOTE — Progress Notes (Signed)
Location:    [place] Room Number: 108/W Place of Service:  SNF (31)   CODE STATUS: DNR  Allergies  Allergen Reactions  . Sulfur Swelling and Rash    Chief Complaint  Patient presents with  . Acute Visit    lower extremity edema     HPI:  Staff reports that for the past several days her lower extremity edema. She tells me that her feet are hurting due to the swelling. She is wearing ted hose. There are no reports of fevers. Her feet are red without warmth present.   Past Medical History:  Diagnosis Date  . Atrial fibrillation ([place])   . Bradycardia   . Breast nodule 11/15/2014  . Breast pain, left 11/15/2014  . Carotid artery disease ([place])   . Dizziness   . Dyspnea    Previous CPX suggesting possible restrictive physiology, respiratory muscle fatigue, diastolic dysfunction  . Essential hypertension, benign   . Hyperlipidemia   . Oxygen dependent    2 liter  . Pacemaker [place]   . Rib pain on left side 11/15/2014  . Seasonal allergies   . Shingles 05/04/2015  . Stroke ([place])   . Toe fracture, left    left big toe    Past Surgical History:  Procedure Laterality Date  . COLONOSCOPY  02/22/2012   Procedure: COLONOSCOPY;  Surgeon: Rogene Houston, MD;  [facility];  Service: Endoscopy;  Laterality: N/A;  100  . KNEE SURGERY     bilateral  . PACEMAKER IMPLANT N/A 07/17/2017   Procedure: Pacemaker Implant;  Surgeon: Deboraha Sprang, MD;  Location: [place] [place] CV LAB;  Service: Cardiovascular;  Laterality: N/A;    Social History   Socioeconomic History  . Marital status: Widowed    Spouse name: Not on file  . Number of children: Not on file  . Years of education: Not on file  . Highest education level: Not on file  Occupational History  . Not on file  Tobacco Use  . Smoking status: Never Smoker  . Smokeless tobacco: Never Used  Substance and Sexual Activity  . Alcohol use: No  . Drug use: No  . Sexual activity: Never   Birth control/protection: Post-menopausal  Other Topics Concern  . Not on file  Social History Narrative  . Not on file   Social Determinants of Health   Financial Resource Strain:   . Difficulty of Paying Living Expenses:   Food Insecurity:   . Worried About Charity fundraiser in the Last Year:   . Arboriculturist in the Last Year:   Transportation Needs:   . Film/video editor (Medical):   Marland Kitchen Lack of Transportation (Non-Medical):   Physical Activity:   . Days of Exercise per Week:   . Minutes of Exercise per Session:   Stress:   . Feeling of Stress :   Social Connections:   . Frequency of Communication with Friends and Family:   . Frequency of Social Gatherings with Friends and Family:   . Attends Religious Services:   . Active Member of Clubs or Organizations:   . Attends Archivist Meetings:   Marland Kitchen Marital Status:   Intimate Partner Violence:   . Fear of Current or Ex-Partner:   . Emotionally Abused:   Marland Kitchen Physically Abused:   . Sexually Abused:    Family History  Problem Relation Age of Onset  . Stroke Mother   . Pneumonia Father   .  Heart disease Father   . Healthy Son   . Glaucoma Daughter   . Emphysema Daughter   . Healthy Daughter   . Other Daughter        had knee replacement  . Healthy Son   . Heart disease Maternal Grandmother       VITAL SIGNS BP 116/78   Pulse 68   Temp 98.2 F (36.8 C) (Oral)   Resp 20   Ht 5\' 4"  (1.626 m)   Wt 139 lb 3.2 oz (63.1 kg)   SpO2 94%   BMI 23.89 kg/m   Outpatient Encounter Medications as of 04/18/2020  Medication Sig  . acetaminophen (TYLENOL) 500 MG tablet Take 1,000 mg by mouth 2 (two) times daily. Max of 3 grams acetaminophen per 24 hrs from all sources.  Marland Kitchen albuterol (PROVENTIL HFA;VENTOLIN HFA) 108 (90 Base) MCG/ACT inhaler Inhale 2 puffs into the lungs at bedtime. For SOB or wheezing  . ANORO ELLIPTA 62.5-25 MCG/INH AEPB Inhale 1 puff into the lungs daily.   Marland Kitchen apixaban (ELIQUIS) 2.5 MG TABS  tablet Take 1 tablet (2.5 mg total) by mouth 2 (two) times daily. Resume 07/20/17  . artificial tears (LACRILUBE) OINT ophthalmic ointment Place 1 application into both eyes at bedtime.  . cycloSPORINE (RESTASIS) 0.05 % ophthalmic emulsion Place 1 drop into both eyes every 12 (twelve) hours.   Marland Kitchen diltiazem (CARDIZEM CD) 180 MG 24 hr capsule TAKE ONE CAPSULE BY MOUTH ONCE DAILY.  . furosemide (LASIX) 20 MG tablet Take 20 mg by mouth 2 (two) times daily.   Marland Kitchen gabapentin (NEURONTIN) 100 MG capsule Take 100 mg by mouth 2 (two) times daily.   . hydrocortisone (ANUSOL-HC) 2.5 % rectal cream Place 1 application rectally 2 (two) times daily as needed. Apply topically for hemorrhoids as directed   . Melatonin 5 MG TABS Take 5 mg by mouth at bedtime as needed.   . Menthol, Topical Analgesic, (BIOFREEZE) 4 % GEL Apply 1 application topically 2 (two) times daily. Apply topically to left shoulder and neck for M/S pain   . NON FORMULARY Diet Type:  Regular  . Nutritional Supplements (ENSURE ENLIVE PO) Take 237 mLs by mouth daily.   . OXYGEN Inhale 2 L into the lungs 2 (two) times daily. 3:15pm to 11:15pm 11:15pm to 07:15am  . pantoprazole (PROTONIX) 40 MG tablet Take 40 mg by mouth daily.   Vladimir Faster Glycol-Propyl Glycol (SYSTANE OP) Place 1 drop into both eyes 2 (two) times daily. For dry eyes  Administer 15 minutes after administering Restasis eye drops.  . polyethylene glycol (MIRALAX / GLYCOLAX) packet Take 17 g by mouth daily as needed. For constipation  . [DISCONTINUED] traMADol (ULTRAM) 50 MG tablet Take 1 tablet (50 mg total) by mouth 3 (three) times daily.  . [DISCONTINUED] Albuterol (VENTOLIN IN) Inhale into the lungs.     No facility-administered encounter medications on file as of 04/18/2020.     SIGNIFICANT DIAGNOSTIC EXAMS   LABS REVIEWED: PREVIOUS   04-30-19: wbc 8.8 hgb 14.2; hct 4.2; mcv 87.0; plt 205; glucose 93; bun 27; creat 1.08; k+ 3.5; na++ 140; ca 9.0 total bili 0.3; albumin 4.2  urine culture: e-coli: macrobid  07-02-19: glucose 91; bun 29; creat 0.96; k+ 4.3; na++ 141; ca 8.7 11-04-19: wbc 5.2; hgb 13.3; hct 42.1; mcv 92.3 plt 202; glucose 92; bun 20; creat 0.70; k+ 3.9; na++ 142; ca 8.8; liver normal albumin 3.5   NO NEW LABS.    Review of Systems  Constitutional:  Negative for malaise/fatigue.  Respiratory: Negative for cough and shortness of breath.   Cardiovascular: Positive for leg swelling. Negative for chest pain and palpitations.  Gastrointestinal: Negative for abdominal pain, constipation and heartburn.  Musculoskeletal: Positive for joint pain. Negative for back pain and myalgias.       Bilateral feet pain   Skin: Negative.   Neurological: Negative for dizziness.  Psychiatric/Behavioral: The patient is not nervous/anxious.       Physical Exam Constitutional:      General: She is not in acute distress.    Appearance: She is well-developed. She is not diaphoretic.  Neck:     Thyroid: No thyromegaly.  Cardiovascular:     Rate and Rhythm: Normal rate and regular rhythm.     Pulses: Normal pulses.     Heart sounds: Normal heart sounds.     Comments: Pace maker  Pulmonary:     Effort: Pulmonary effort is normal. No respiratory distress.     Breath sounds: Normal breath sounds.     Comments: 02 dependent  Abdominal:     General: Bowel sounds are normal. There is no distension.     Palpations: Abdomen is soft.     Tenderness: There is no abdominal tenderness.  Musculoskeletal:        General: Normal range of motion.     Cervical back: Neck supple.     Right lower leg: Edema present.     Left lower leg: Edema present.     Comments: Limited range of motion to left shoulder   4+ bilateral lower extremity   Lymphadenopathy:     Cervical: No cervical adenopathy.  Skin:    General: Skin is warm and dry.  Neurological:     Mental Status: She is alert. Mental status is at baseline.  Psychiatric:        Mood and Affect: Mood normal.      ASSESSMENT/ PLAN:  TODAY;   1. Chronic diastolic heart failure: EF 60-65% (06-21-17)  Is worse:   Will increase lasix to 20 mg twice daily  Will check cbc cmp 04-25-20     MD is aware of resident's narcotic use and is in agreement with current plan of care. We will attempt to wean resident as appropriate.  Ok Edwards NP [facility]  Contact [phone] Monday through Friday 8am- 5pm  After hours call [phone]

## 2020-04-19 ENCOUNTER — Ambulatory Visit (INDEPENDENT_AMBULATORY_CARE_PROVIDER_SITE_OTHER): Payer: Medicare Other | Admitting: *Deleted

## 2020-04-19 DIAGNOSIS — I442 Atrioventricular block, complete: Secondary | ICD-10-CM

## 2020-04-19 LAB — CUP PACEART REMOTE DEVICE CHECK
Battery Remaining Longevity: 147 mo
Battery Remaining Percentage: 95.5 %
Battery Voltage: 3.01 V
Brady Statistic RV Percent Paced: 99 %
Date Time Interrogation Session: 20210511020015
Implantable Lead Implant Date: 20180808
Implantable Lead Location: 753860
Implantable Lead Model: 1948
Implantable Pulse Generator Implant Date: 20180808
Lead Channel Impedance Value: 610 Ohm
Lead Channel Pacing Threshold Amplitude: 0.625 V
Lead Channel Pacing Threshold Pulse Width: 0.5 ms
Lead Channel Sensing Intrinsic Amplitude: 4.2 mV
Lead Channel Setting Pacing Amplitude: 0.875
Lead Channel Setting Pacing Pulse Width: 0.5 ms
Lead Channel Setting Sensing Sensitivity: 0.5 mV
Pulse Gen Model: 1272
Pulse Gen Serial Number: 7957820

## 2020-04-20 ENCOUNTER — Other Ambulatory Visit: Payer: Self-pay | Admitting: Adult Health

## 2020-04-20 MED ORDER — TRAMADOL HCL 50 MG PO TABS: 50.0000 mg | ORAL_TABLET | Freq: Three times a day (TID) | ORAL | 0 refills | Status: DC

## 2020-04-20 NOTE — Progress Notes (Signed)
Remote pacemaker transmission.   

## 2020-04-25 ENCOUNTER — Encounter (HOSPITAL_COMMUNITY)
Admission: RE | Admit: 2020-04-25 | Discharge: 2020-04-25 | Disposition: A | Discharge status: Home / Self Care | Payer: Medicare Other | Source: Skilled Nursing Facility | Attending: Adult Health | Admitting: Adult Health

## 2020-04-25 DIAGNOSIS — I130 Hypertensive heart and chronic kidney disease with heart failure and stage 1 through stage 4 chronic kidney disease, or unspecified chronic kidney disease: Secondary | ICD-10-CM | POA: Insufficient documentation

## 2020-04-25 LAB — COMPREHENSIVE METABOLIC PANEL
ALT: 12 U/L (ref 0–44)
AST: 18 U/L (ref 15–41)
Albumin: 3.3 g/dL — ABNORMAL LOW (ref 3.5–5.0)
Alkaline Phosphatase: 86 U/L (ref 38–126)
Anion gap: 10 (ref 5–15)
BUN: 26 mg/dL — ABNORMAL HIGH (ref 8–23)
CO2: 27 mmol/L (ref 22–32)
Calcium: 8.8 mg/dL — ABNORMAL LOW (ref 8.9–10.3)
Chloride: 101 mmol/L (ref 98–111)
Creatinine, Ser: 0.94 mg/dL (ref 0.44–1.00)
GFR calc Af Amer: 58 mL/min — ABNORMAL LOW (ref >60)
GFR calc non Af Amer: 50 mL/min — ABNORMAL LOW (ref >60)
Glucose, Bld: 96 mg/dL (ref 70–99)
Potassium: 4.2 mmol/L (ref 3.5–5.1)
Sodium: 138 mmol/L (ref 135–145)
Total Bilirubin: 0.7 mg/dL (ref 0.3–1.2)
Total Protein: 6 g/dL — ABNORMAL LOW (ref 6.5–8.1)

## 2020-04-25 LAB — CBC
HCT: 40.2 % (ref 36.0–46.0)
Hemoglobin: 12.3 g/dL (ref 12.0–15.0)
MCH: 25.6 pg — ABNORMAL LOW (ref 26.0–34.0)
MCHC: 30.6 g/dL (ref 30.0–36.0)
MCV: 83.8 fL (ref 80.0–100.0)
Platelets: 219 10*3/uL (ref 150–400)
RBC: 4.8 MIL/uL (ref 3.87–5.11)
RDW: 15.2 % (ref 11.5–15.5)
WBC: 5.5 10*3/uL (ref 4.0–10.5)
nRBC: 0 % (ref 0.0–0.2)

## 2020-05-10 ENCOUNTER — Encounter: Payer: Self-pay | Admitting: Adult Health

## 2020-05-10 ENCOUNTER — Non-Acute Institutional Stay (SKILLED_NURSING_FACILITY): Payer: Medicare Other | Admitting: Adult Health

## 2020-05-10 DIAGNOSIS — M25562 Pain in left knee: Secondary | ICD-10-CM

## 2020-05-10 DIAGNOSIS — M545 Low back pain, unspecified: Secondary | ICD-10-CM

## 2020-05-10 DIAGNOSIS — G8929 Other chronic pain: Secondary | ICD-10-CM

## 2020-05-10 NOTE — Progress Notes (Signed)
Location:    [place] Room Number: 108/W Place of Service:  SNF (31)   CODE STATUS: DNR  Allergies  Allergen Reactions  . Sulfur Swelling and Rash    Chief Complaint  Patient presents with  . Acute Visit    Left Back Pain    HPI:  She is complaining today of left lower back pain. It is intermittent and achy in nature.   She is also having left knee pain. The pain is constant and the knee is tender to touch. Her appetite is poor.   Past Medical History:  Diagnosis Date  . Atrial fibrillation ([place])   . Bradycardia   . Breast nodule 11/15/2014  . Breast pain, left 11/15/2014  . Carotid artery disease ([place])   . Dizziness   . Dyspnea    Previous CPX suggesting possible restrictive physiology, respiratory muscle fatigue, diastolic dysfunction  . Essential hypertension, benign   . Hyperlipidemia   . Oxygen dependent    2 liter  . Pacemaker [place]   . Rib pain on left side 11/15/2014  . Seasonal allergies   . Shingles 05/04/2015  . Stroke ([place])   . Toe fracture, left    left big toe    Past Surgical History:  Procedure Laterality Date  . COLONOSCOPY  02/22/2012   Procedure: COLONOSCOPY;  Surgeon: Rogene Houston, MD;  [facility];  Service: Endoscopy;  Laterality: N/A;  100  . KNEE SURGERY     bilateral  . PACEMAKER IMPLANT N/A 07/17/2017   Procedure: Pacemaker Implant;  Surgeon: Deboraha Sprang, MD;  Location: [facility] CV LAB;  Service: Cardiovascular;  Laterality: N/A;    Social History   Socioeconomic History  . Marital status: Widowed    Spouse name: Not on file  . Number of children: Not on file  . Years of education: Not on file  . Highest education level: Not on file  Occupational History  . Not on file  Tobacco Use  . Smoking status: Never Smoker  . Smokeless tobacco: Never Used  Substance and Sexual Activity  . Alcohol use: No  . Drug use: No  . Sexual activity: Never    Birth control/protection:  Post-menopausal  Other Topics Concern  . Not on file  Social History Narrative  . Not on file   Social Determinants of Health   Financial Resource Strain:   . Difficulty of Paying Living Expenses:   Food Insecurity:   . Worried About Charity fundraiser in the Last Year:   . Arboriculturist in the Last Year:   Transportation Needs:   . Film/video editor (Medical):   Marland Kitchen Lack of Transportation (Non-Medical):   Physical Activity:   . Days of Exercise per Week:   . Minutes of Exercise per Session:   Stress:   . Feeling of Stress :   Social Connections:   . Frequency of Communication with Friends and Family:   . Frequency of Social Gatherings with Friends and Family:   . Attends Religious Services:   . Active Member of Clubs or Organizations:   . Attends Archivist Meetings:   Marland Kitchen Marital Status:   Intimate Partner Violence:   . Fear of Current or Ex-Partner:   . Emotionally Abused:   Marland Kitchen Physically Abused:   . Sexually Abused:    Family History  Problem Relation Age of Onset  . Stroke Mother   . Pneumonia Father   .  Heart disease Father   . Healthy Son   . Glaucoma Daughter   . Emphysema Daughter   . Healthy Daughter   . Other Daughter        had knee replacement  . Healthy Son   . Heart disease Maternal Grandmother       VITAL SIGNS BP (!) 151/73   Pulse 63   Temp (!) 97.2 F (36.2 C) (Oral)   Resp 18   Ht 5' (1.524 m)   Wt 139 lb 3.2 oz (63.1 kg)   SpO2 95%   BMI 27.19 kg/m   Outpatient Encounter Medications as of 05/10/2020  Medication Sig  . acetaminophen (TYLENOL) 500 MG tablet Take 1,000 mg by mouth 2 (two) times daily. Max of 3 grams acetaminophen per 24 hrs from all sources.  Marland Kitchen albuterol (PROVENTIL HFA;VENTOLIN HFA) 108 (90 Base) MCG/ACT inhaler Inhale 2 puffs into the lungs at bedtime. For SOB or wheezing  . ANORO ELLIPTA 62.5-25 MCG/INH AEPB Inhale 1 puff into the lungs daily.   Marland Kitchen apixaban (ELIQUIS) 2.5 MG TABS tablet Take 1 tablet  (2.5 mg total) by mouth 2 (two) times daily. Resume 07/20/17  . artificial tears (LACRILUBE) OINT ophthalmic ointment Place 1 application into both eyes at bedtime.  . cycloSPORINE (RESTASIS) 0.05 % ophthalmic emulsion Place 1 drop into both eyes every 12 (twelve) hours.   Marland Kitchen diltiazem (CARDIZEM CD) 180 MG 24 hr capsule TAKE ONE CAPSULE BY MOUTH ONCE DAILY.  . furosemide (LASIX) 20 MG tablet Take 20 mg by mouth 2 (two) times daily.   Marland Kitchen gabapentin (NEURONTIN) 100 MG capsule Take 100 mg by mouth 2 (two) times daily.   . hydrocortisone (ANUSOL-HC) 2.5 % rectal cream Place 1 application rectally 2 (two) times daily as needed. Apply topically for hemorrhoids as directed   . Melatonin 5 MG TABS Take 5 mg by mouth at bedtime as needed.   . Menthol, Topical Analgesic, (BIOFREEZE) 4 % GEL Apply 1 application topically 2 (two) times daily. Apply topically to left shoulder and neck for M/S pain   . NON FORMULARY Diet Type:  Regular  . Nutritional Supplements (ENSURE ENLIVE PO) Take 237 mLs by mouth daily.   . OXYGEN Inhale 2 L into the lungs 2 (two) times daily. 3:15pm to 11:15pm 11:15pm to 07:15am  . pantoprazole (PROTONIX) 40 MG tablet Take 40 mg by mouth daily.   Vladimir Faster Glycol-Propyl Glycol (SYSTANE OP) Place 1 drop into both eyes 2 (two) times daily. For dry eyes  Administer 15 minutes after administering Restasis eye drops.  . polyethylene glycol (MIRALAX / GLYCOLAX) packet Take 17 g by mouth daily as needed. For constipation  . traMADol (ULTRAM) 50 MG tablet Take 1 tablet (50 mg total) by mouth 3 (three) times daily.  . [DISCONTINUED] Albuterol (VENTOLIN IN) Inhale into the lungs.     No facility-administered encounter medications on file as of 05/10/2020.     SIGNIFICANT DIAGNOSTIC EXAMS  TODAY  05-09-20: KUB normal KUB  05-09-20: thoracic spine x-ray:Multilevel degenerative disc disease with dextroscoliosis.  05-09-20: lumbar spine x-ray:  Multilevel degenerative disc disease with no acute  fracture.    LABS REVIEWED: PREVIOUS   07-02-19: glucose 91; bun 29; creat 0.96; k+ 4.3; na++ 141; ca 8.7 11-04-19: wbc 5.2; hgb 13.3; hct 42.1; mcv 92.3 plt 202; glucose 92; bun 20; creat 0.70; k+ 3.9; na++ 142; ca 8.8; liver normal albumin 3.5   TODAY  04-25-20: wbc 5.5; hgb 12.3; hct 40.2 mcv 83.8 plt  219; glucose 96; bun 26; creat 0.94; k+ 4.2; na++ 138; ca 8.8 liver normal albumin 3.3    Review of Systems  Constitutional: Negative for malaise/fatigue.  Respiratory: Negative for cough and shortness of breath.   Cardiovascular: Negative for chest pain, palpitations and leg swelling.  Gastrointestinal: Negative for abdominal pain, constipation and heartburn.  Musculoskeletal: Positive for back pain. Negative for joint pain and myalgias.  Skin: Negative.   Neurological: Negative for dizziness.  Psychiatric/Behavioral: The patient is not nervous/anxious.     Physical Exam Constitutional:      General: She is not in acute distress.    Appearance: She is well-developed. She is not diaphoretic.  Neck:     Thyroid: No thyromegaly.  Cardiovascular:     Rate and Rhythm: Normal rate and regular rhythm.     Heart sounds: Normal heart sounds.     Comments: Pace maker  Pulmonary:     Effort: Pulmonary effort is normal. No respiratory distress.     Breath sounds: Normal breath sounds.     Comments: 02 dependent  Abdominal:     General: Bowel sounds are normal. There is no distension.     Palpations: Abdomen is soft.     Tenderness: There is no abdominal tenderness.  Musculoskeletal:     Cervical back: Neck supple.     Right lower leg: Edema present.     Left lower leg: Edema present.     Comments: Limited range of motion to left shoulder   2-3+ bilateral lower extremity    Lymphadenopathy:     Cervical: No cervical adenopathy.  Skin:    General: Skin is warm and dry.  Neurological:     Mental Status: She is alert. Mental status is at baseline.  Psychiatric:        Mood and  Affect: Mood normal.      ASSESSMENT/ PLAN:  TODAY  1. Chronic left side low back pain without sciatica 2. Chronic left knee pain  Will change biofreeze to three times daily  Will change to tylenol cr 650 mg every 6 hours Will change to ultram 50 mg every 8 hours  Will monitor her status   MD is aware of resident's narcotic use and is in agreement with current plan of care. We will attempt to wean resident as appropriate.  Ok Edwards NP [facility]  Contact [phone] Monday through Friday 8am- 5pm  After hours call [phone]

## 2020-05-12 ENCOUNTER — Encounter: Payer: Self-pay | Admitting: Adult Health

## 2020-05-12 ENCOUNTER — Non-Acute Institutional Stay (SKILLED_NURSING_FACILITY): Payer: Medicare Other | Admitting: Adult Health

## 2020-05-12 DIAGNOSIS — I4811 Longstanding persistent atrial fibrillation: Secondary | ICD-10-CM

## 2020-05-12 DIAGNOSIS — I5032 Chronic diastolic (congestive) heart failure: Secondary | ICD-10-CM

## 2020-05-12 DIAGNOSIS — K5909 Other constipation: Secondary | ICD-10-CM | POA: Diagnosis not present

## 2020-05-12 NOTE — Progress Notes (Signed)
Location:    [place] Room Number: 108/W Place of Service:  SNF (31)   CODE STATUS: DNR  Allergies  Allergen Reactions   Sulfur Swelling and Rash    Chief Complaint  Patient presents with   Medical Management of Chronic Issues        Chronic constipation:  chronic diastolic heart failure:  Longstanding persistent atrial fibrillation     HPI:  She is a 86 year old long term resident of this facility being seen for the management of her chronic illnesses: constipation: chf; afib. No reports of heart palpitation; no uncontrolled pain. She is slowly losing weight. Her appetite has been poor.   Past Medical History:  Diagnosis Date   Atrial fibrillation (HCC)    Bradycardia    Breast nodule 11/15/2014   Breast pain, left 11/15/2014   Carotid artery disease (HCC)    Dizziness    Dyspnea    Previous CPX suggesting possible restrictive physiology, respiratory muscle fatigue, diastolic dysfunction   Essential hypertension, benign    Hyperlipidemia    Oxygen dependent    2 liter   Pacemaker [employer]    Rib pain on left side 11/15/2014   Seasonal allergies    Shingles 05/04/2015   Stroke ([place])    Toe fracture, left    left big toe    Past Surgical History:  Procedure Laterality Date   COLONOSCOPY  02/22/2012   Procedure: COLONOSCOPY;  Surgeon: Rogene Houston, MD;  [facility];  Service: Endoscopy;  Laterality: N/A;  100   KNEE SURGERY     bilateral   PACEMAKER IMPLANT N/A 07/17/2017   Procedure: Pacemaker Implant;  Surgeon: Deboraha Sprang, MD;  Location: [place] [facility];  Service: Cardiovascular;  Laterality: N/A;    Social History   Socioeconomic History   Marital status: Widowed    Spouse name: Not on file   Number of children: Not on file   Years of education: Not on file   Highest education level: Not on file  Occupational History   Not on file  Tobacco Use   Smoking status: Never Smoker    Smokeless tobacco: Never Used  Substance and Sexual Activity   Alcohol use: No   Drug use: No   Sexual activity: Never    Birth control/protection: Post-menopausal  Other Topics Concern   Not on file  Social History Narrative   Not on file   Social Determinants of Health   Financial Resource Strain:    Difficulty of Paying Living Expenses:   Food Insecurity:    Worried About Charity fundraiser in the Last Year:    Arboriculturist in the Last Year:   Transportation Needs:    Film/video editor (Medical):    Lack of Transportation (Non-Medical):   Physical Activity:    Days of Exercise per Week:    Minutes of Exercise per Session:   Stress:    Feeling of Stress :   Social Connections:    Frequency of Communication with Friends and Family:    Frequency of Social Gatherings with Friends and Family:    Attends Religious Services:    Active Member of Clubs or Organizations:    Attends Archivist Meetings:    Marital Status:   Intimate Partner Violence:    Fear of Current or Ex-Partner:    Emotionally Abused:    Physically Abused:    Sexually Abused:  Family History  Problem Relation Age of Onset   Stroke Mother    Pneumonia Father    Heart disease Father    Healthy Son    Glaucoma Daughter    Emphysema Daughter    Healthy Daughter    Other Daughter        had knee replacement   Healthy Son    Heart disease Maternal Grandmother       VITAL SIGNS BP (!) 151/73    Pulse 63    Temp 98.1 F (36.7 C) (Oral)    Resp 18    Ht 5\' 4"  (1.626 m)    Wt 133 lb 3.2 oz (60.4 kg)    SpO2 96%    BMI 22.86 kg/m   Outpatient Encounter Medications as of 05/12/2020  Medication Sig   acetaminophen (TYLENOL) 650 MG CR tablet Take 650 mg by mouth every 8 (eight) hours.   albuterol (PROVENTIL HFA;VENTOLIN HFA) 108 (90 Base) MCG/ACT inhaler Inhale 2 puffs into the lungs at bedtime. For SOB or wheezing   ANORO ELLIPTA 62.5-25 MCG/INH  AEPB Inhale 1 puff into the lungs daily.    apixaban (ELIQUIS) 2.5 MG TABS tablet Take 1 tablet (2.5 mg total) by mouth 2 (two) times daily. Resume 07/20/17   artificial tears (LACRILUBE) OINT ophthalmic ointment Place 1 application into both eyes at bedtime.   artificial tears (LACRILUBE) OINT ophthalmic ointment Place 1 application into both eyes daily as needed for dry eyes.   cycloSPORINE (RESTASIS) 0.05 % ophthalmic emulsion Place 1 drop into both eyes every 12 (twelve) hours.    diltiazem (CARDIZEM CD) 180 MG 24 hr capsule TAKE ONE CAPSULE BY MOUTH ONCE DAILY.   furosemide (LASIX) 20 MG tablet Take 20 mg by mouth 2 (two) times daily.    gabapentin (NEURONTIN) 100 MG capsule Take 100 mg by mouth 2 (two) times daily.    hydrocortisone (ANUSOL-HC) 2.5 % rectal cream Place 1 application rectally 2 (two) times daily as needed. Apply topically for hemorrhoids as directed    Melatonin 5 MG TABS Take 5 mg by mouth at bedtime as needed.    Menthol, Topical Analgesic, (BIOFREEZE) 4 % GEL Apply 1 application topically in the morning, at noon, and at bedtime. Apply topically to left shoulder, left knee and neck for M/S pain   NON FORMULARY Diet Type:  Regular   Nutritional Supplements (ENSURE ENLIVE PO) Take 237 mLs by mouth daily.    OXYGEN Inhale 2 L into the lungs at bedtime.    pantoprazole (PROTONIX) 40 MG tablet Take 40 mg by mouth daily.    Polyethyl Glycol-Propyl Glycol (SYSTANE OP) Place 1 drop into both eyes 2 (two) times daily. For dry eyes  Administer 15 minutes after administering Restasis eye drops.   polyethylene glycol (MIRALAX / GLYCOLAX) packet Take 17 g by mouth daily as needed. For constipation   traMADol (ULTRAM) 50 MG tablet Take 50 mg by mouth every 8 (eight) hours.   No facility-administered encounter medications on file as of 05/12/2020.     SIGNIFICANT DIAGNOSTIC EXAMS   PREVIOUS  05-09-20: KUB normal KUB  05-09-20: thoracic spine x-ray:Multilevel  degenerative disc disease with dextroscoliosis.  05-09-20: lumbar spine x-ray:  Multilevel degenerative disc disease with no acute fracture.  NO NEW EXAMS.     LABS REVIEWED: PREVIOUS   07-02-19: glucose 91; bun 29; creat 0.96; k+ 4.3; na++ 141; ca 8.7 11-04-19: wbc 5.2; hgb 13.3; hct 42.1; mcv 92.3 plt 202; glucose 92; bun 20;  creat 0.70; k+ 3.9; na++ 142; ca 8.8; liver normal albumin 3.5  04-25-20: wbc 5.5; hgb 12.3; hct 40.2 mcv 83.8 plt 219; glucose 96; bun 26; creat 0.94; k+ 4.2; na++ 138; ca 8.8 liver normal albumin 3.3    NO NEW LABS.   Review of Systems  Constitutional: Negative for malaise/fatigue.  Respiratory: Negative for cough and shortness of breath.   Cardiovascular: Negative for chest pain, palpitations and leg swelling.  Gastrointestinal: Negative for abdominal pain, constipation and heartburn.  Musculoskeletal: Negative for back pain, joint pain and myalgias.  Skin: Negative.   Neurological: Negative for dizziness.  Psychiatric/Behavioral: The patient is not nervous/anxious.      Physical Exam Constitutional:      General: She is not in acute distress.    Appearance: She is well-developed. She is not diaphoretic.  Neck:     Thyroid: No thyromegaly.  Cardiovascular:     Rate and Rhythm: Normal rate and regular rhythm.     Pulses: Normal pulses.     Heart sounds: Normal heart sounds.     Comments: Pace maker  Pulmonary:     Effort: Pulmonary effort is normal. No respiratory distress.     Breath sounds: Normal breath sounds.     Comments: 02 dependent  Abdominal:     General: Bowel sounds are normal. There is no distension.     Palpations: Abdomen is soft.     Tenderness: There is no abdominal tenderness.  Musculoskeletal:     Cervical back: Neck supple.     Right lower leg: Edema present.     Left lower leg: Edema present.     Comments: Limited range of motion to left shoulder   2+ bilateral lower extremity     Lymphadenopathy:     Cervical: No  cervical adenopathy.  Skin:    General: Skin is warm and dry.  Neurological:     Mental Status: She is alert. Mental status is at baseline.  Psychiatric:        Mood and Affect: Mood normal.     ASSESSMENT/ PLAN:   TODAY;   1. Chronic constipation: is stable will continue miralax daily as needed  2. chronic diastolic heart failure: EF 60-65% (06-21-17) is stable will continue lasix 20 mg twice daily   3. Longstanding persistent atrial fibrillation is status post pace maker will continue cardizem cd 180 mg daily for rate control eliquis 2.5 mg twice daily   PREVIOUS  4. Dyslipidemia is stable is off lipitor will monitor  5. Hypertensive heart and kidney disease with chronic diastolic congestive heart failure and stage 3 chronic kidney disease is stable b/p 151/73 is off norvasc will continue cardizem cd 180 mg daily will monitor   6. Weight loss: her weight without change current weight is 139 pounds; 03-06-19: 153 pounds; 05-25-19 137 pounds current weight is 133 pounds  7. Chronic obstructive pulmonary disease is stable 02 dependent will continue anoro 62.5/25 mcg 1 puff daily albuterol 2 puffs every hs   8. Chronic kidney disease stage 3 (moderate) is stable bun 20; creat 0.70 will monitor  9. GERD without  esophagitis: is stable will continue protonix 40 mg daily   10. Chronic bilateral low back pain without sciatica is stable will continue tylenol cr 650 mg three times  and ultram 50 mg three times daily has biofreeze to neck left knee  and left shoulder.   11. Peripheral neuropathy is stable will continue gabapentin 100 mg twice daily  12. CVA (cerebrovascular accident) is stable will continue eliquis 2.5 mg twice daily   13. Complete heart block is status post [employer] PTV/DP pace maker will monitor        MD is aware of resident's narcotic use and is in agreement with current plan of care. We will attempt to wean resident as appropriate.  Ok Edwards  NP [facility]  Contact [phone] Monday through Friday 8am- 5pm  After hours call [phone]

## 2020-05-17 ENCOUNTER — Other Ambulatory Visit: Payer: Self-pay | Admitting: Adult Health

## 2020-05-17 MED ORDER — TRAMADOL HCL 50 MG PO TABS: 50.0000 mg | ORAL_TABLET | Freq: Three times a day (TID) | ORAL | 0 refills | Status: DC

## 2020-05-25 ENCOUNTER — Non-Acute Institutional Stay: Payer: Self-pay | Admitting: Adult Health

## 2020-05-25 ENCOUNTER — Encounter: Payer: Self-pay | Admitting: Adult Health

## 2020-05-25 DIAGNOSIS — G8929 Other chronic pain: Secondary | ICD-10-CM

## 2020-05-25 NOTE — Progress Notes (Signed)
Location:    [place] Room Number: 108/W Place of Service:  SNF (31)   CODE STATUS: DNR  Allergies  Allergen Reactions   Sulfur Swelling and Rash    Chief Complaint  Patient presents with   Acute Visit    Medication Review    HPI:  She is presently on ultram 50 mg three times daily. She has had a fall in early June. There was no injury present; but she had had significant pain. The ultram is present effective. She has lost weight due to her pain. She presently denies any uncontrolled pain. Her mood state is stable.   Past Medical History:  Diagnosis Date   Atrial fibrillation (HCC)    Bradycardia    Breast nodule 11/15/2014   Breast pain, left 11/15/2014   Carotid artery disease (HCC)    Dizziness    Dyspnea    Previous CPX suggesting possible restrictive physiology, respiratory muscle fatigue, diastolic dysfunction   Essential hypertension, benign    Hyperlipidemia    Oxygen dependent    2 liter   Pacemaker [employer]    Rib pain on left side 11/15/2014   Seasonal allergies    Shingles 05/04/2015   Stroke ([place])    Toe fracture, left    left big toe    Past Surgical History:  Procedure Laterality Date   COLONOSCOPY  02/22/2012   Procedure: COLONOSCOPY;  Surgeon: Rogene Houston, MD;  [facility];  Service: Endoscopy;  Laterality: N/A;  100   KNEE SURGERY     bilateral   PACEMAKER IMPLANT N/A 07/17/2017   Procedure: Pacemaker Implant;  Surgeon: Deboraha Sprang, MD;  Location: [place] [facility];  Service: Cardiovascular;  Laterality: N/A;    Social History   Socioeconomic History   Marital status: Widowed    Spouse name: Not on file   Number of children: Not on file   Years of education: Not on file   Highest education level: Not on file  Occupational History   Not on file  Tobacco Use   Smoking status: Never Smoker   Smokeless tobacco: Never Used  Vaping Use   Vaping Use: Never used    Substance and Sexual Activity   Alcohol use: No   Drug use: No   Sexual activity: Never    Birth control/protection: Post-menopausal  Other Topics Concern   Not on file  Social History Narrative   Not on file   Social Determinants of Health   Financial Resource Strain:    Difficulty of Paying Living Expenses:   Food Insecurity:    Worried About Charity fundraiser in the Last Year:    Arboriculturist in the Last Year:   Transportation Needs:    Film/video editor (Medical):    Lack of Transportation (Non-Medical):   Physical Activity:    Days of Exercise per Week:    Minutes of Exercise per Session:   Stress:    Feeling of Stress :   Social Connections:    Frequency of Communication with Friends and Family:    Frequency of Social Gatherings with Friends and Family:    Attends Religious Services:    Active Member of Clubs or Organizations:    Attends Archivist Meetings:    Marital Status:   Intimate Partner Violence:    Fear of Current or Ex-Partner:    Emotionally Abused:    Physically Abused:    Sexually  Abused:    Family History  Problem Relation Age of Onset   Stroke Mother    Pneumonia Father    Heart disease Father    Healthy Son    Glaucoma Daughter    Emphysema Daughter    Healthy Daughter    Other Daughter        had knee replacement   Healthy Son    Heart disease Maternal Grandmother       VITAL SIGNS BP 132/68    Pulse 62    Temp (!) 97.1 F (36.2 C) (Oral)    Resp 18    Ht 5\' 4"  (1.626 m)    Wt 133 lb 3.2 oz (60.4 kg)    SpO2 93%    BMI 22.86 kg/m   Outpatient Encounter Medications as of 05/25/2020  Medication Sig   acetaminophen (TYLENOL) 650 MG CR tablet Take 650 mg by mouth every 8 (eight) hours.   albuterol (PROVENTIL HFA;VENTOLIN HFA) 108 (90 Base) MCG/ACT inhaler Inhale 2 puffs into the lungs at bedtime. For SOB or wheezing   ANORO ELLIPTA 62.5-25 MCG/INH AEPB Inhale 1 puff into the  lungs daily.    apixaban (ELIQUIS) 2.5 MG TABS tablet Take 1 tablet (2.5 mg total) by mouth 2 (two) times daily. Resume 07/20/17   artificial tears (LACRILUBE) OINT ophthalmic ointment Place 1 application into both eyes at bedtime.   artificial tears (LACRILUBE) OINT ophthalmic ointment Place 1 application into both eyes daily as needed for dry eyes.   cycloSPORINE (RESTASIS) 0.05 % ophthalmic emulsion Place 1 drop into both eyes every 12 (twelve) hours.    diltiazem (CARDIZEM CD) 180 MG 24 hr capsule TAKE ONE CAPSULE BY MOUTH ONCE DAILY.   furosemide (LASIX) 20 MG tablet Take 20 mg by mouth 2 (two) times daily.    gabapentin (NEURONTIN) 100 MG capsule Take 100 mg by mouth 2 (two) times daily.    hydrocortisone (ANUSOL-HC) 2.5 % rectal cream Place 1 application rectally 2 (two) times daily as needed. Apply topically for hemorrhoids as directed    Melatonin 5 MG TABS Take 5 mg by mouth at bedtime as needed.    Menthol, Topical Analgesic, (BIOFREEZE) 4 % GEL Apply 1 application topically in the morning, at noon, and at bedtime. Apply topically to left shoulder, left knee and neck for M/S pain   NON FORMULARY Diet Type:  Regular   Nutritional Supplements (ENSURE ENLIVE PO) Take 237 mLs by mouth daily.    OXYGEN Inhale 2 L into the lungs at bedtime.    pantoprazole (PROTONIX) 40 MG tablet Take 40 mg by mouth daily.    Polyethyl Glycol-Propyl Glycol (SYSTANE OP) Place 1 drop into both eyes 2 (two) times daily. For dry eyes  Administer 15 minutes after administering Restasis eye drops.   polyethylene glycol (MIRALAX / GLYCOLAX) packet Take 17 g by mouth daily as needed. For constipation   traMADol (ULTRAM) 50 MG tablet Take 1 tablet (50 mg total) by mouth every 8 (eight) hours.   [DISCONTINUED] Albuterol (VENTOLIN IN) Inhale into the lungs.     No facility-administered encounter medications on file as of 05/25/2020.     SIGNIFICANT DIAGNOSTIC EXAMS   PREVIOUS  05-09-20: KUB  normal KUB  05-09-20: thoracic spine x-ray:Multilevel degenerative disc disease with dextroscoliosis.  05-09-20: lumbar spine x-ray:  Multilevel degenerative disc disease with no acute fracture.  NO NEW EXAMS.     LABS REVIEWED: PREVIOUS   07-02-19: glucose 91; bun 29; creat 0.96; k+ 4.3;  na++ 141; ca 8.7 11-04-19: wbc 5.2; hgb 13.3; hct 42.1; mcv 92.3 plt 202; glucose 92; bun 20; creat 0.70; k+ 3.9; na++ 142; ca 8.8; liver normal albumin 3.5  04-25-20: wbc 5.5; hgb 12.3; hct 40.2 mcv 83.8 plt 219; glucose 96; bun 26; creat 0.94; k+ 4.2; na++ 138; ca 8.8 liver normal albumin 3.3    NO NEW LABS.   Review of Systems  Constitutional: Negative for malaise/fatigue.  Respiratory: Negative for cough and shortness of breath.   Cardiovascular: Negative for chest pain, palpitations and leg swelling.  Gastrointestinal: Negative for abdominal pain, constipation and heartburn.  Musculoskeletal: Negative for back pain, joint pain and myalgias.  Skin: Negative.   Neurological: Negative for dizziness.  Psychiatric/Behavioral: The patient is not nervous/anxious.    Physical Exam Constitutional:      General: She is not in acute distress.    Appearance: She is well-developed. She is not diaphoretic.  Neck:     Thyroid: No thyromegaly.  Cardiovascular:     Rate and Rhythm: Normal rate and regular rhythm.     Heart sounds: Normal heart sounds.     Comments: Pace maker  Pulmonary:     Effort: Pulmonary effort is normal. No respiratory distress.     Breath sounds: Normal breath sounds.     Comments:   Abdominal:     General: Bowel sounds are normal. There is no distension.     Palpations: Abdomen is soft.     Tenderness: There is no abdominal tenderness.  Musculoskeletal:     Cervical back: Neck supple.     Right lower leg: Edema present.     Left lower leg: Edema present.     Comments: Limited range of motion to left shoulder   2+ bilateral lower extremity      Lymphadenopathy:      Cervical: No cervical adenopathy.  Skin:    General: Skin is warm and dry.  Neurological:     Mental Status: She is alert. Mental status is at baseline.  Psychiatric:        Mood and Affect: Mood normal.       ASSESSMENT/ PLAN:  TODAY  1. Chronic generalized pain:   Pain is managed will continue ultram 50 mg three times daily     MD is aware of resident's narcotic use and is in agreement with current plan of care. We will attempt to wean resident as appropriate.  Ok Edwards NP [facility]  Contact [phone] Monday through Friday 8am- 5pm  After hours call [phone]

## 2020-05-26 ENCOUNTER — Encounter: Payer: Self-pay | Admitting: Adult Health

## 2020-05-26 ENCOUNTER — Non-Acute Institutional Stay (SKILLED_NURSING_FACILITY): Payer: Medicare Other | Admitting: Adult Health

## 2020-05-26 DIAGNOSIS — I639 Cerebral infarction, unspecified: Secondary | ICD-10-CM

## 2020-05-26 DIAGNOSIS — J449 Chronic obstructive pulmonary disease, unspecified: Secondary | ICD-10-CM | POA: Diagnosis not present

## 2020-05-26 DIAGNOSIS — I5032 Chronic diastolic (congestive) heart failure: Secondary | ICD-10-CM

## 2020-05-26 NOTE — Progress Notes (Signed)
Location:    [place] Room Number: 108/W Place of Service:  SNF (31)   CODE STATUS: DNR  Allergies  Allergen Reactions  . Sulfur Swelling and Rash    Chief Complaint  Patient presents with  . Acute Visit    Care Plan Meeting    HPI:  We have come together for her care plan meeting. BIMS 13/15 mood 3/30. Family present. She has had falls X4. She had gone to the dentist; but declined to have a tooth removed. She has had an oral infection and has now agreed to have her tooth removed. She has lost some weight due to her oral health. Will setup another dental appointment for her. We have discussed her advanced directives to include code status; hospitalization; tube feeding; ivf and abt. The MOST form has been filled out. She continues to be followed for her chronic illnesses including: Cerebrovascular accident (CVA) unspecified mechanism Chronic obstructive pulmonary disease unspecified COPD type Chronic diastolic congestive heart failure  Past Medical History:  Diagnosis Date  . Atrial fibrillation ([place])   . Bradycardia   . Breast nodule 11/15/2014  . Breast pain, left 11/15/2014  . Carotid artery disease ([place])   . Dizziness   . Dyspnea    Previous CPX suggesting possible restrictive physiology, respiratory muscle fatigue, diastolic dysfunction  . Essential hypertension, benign   . Hyperlipidemia   . Oxygen dependent    2 liter  . Pacemaker [place]   . Rib pain on left side 11/15/2014  . Seasonal allergies   . Shingles 05/04/2015  . Stroke ([place])   . Toe fracture, left    left big toe    Past Surgical History:  Procedure Laterality Date  . COLONOSCOPY  02/22/2012   Procedure: COLONOSCOPY;  Surgeon: Rogene Houston, MD;  [facility];  Service: Endoscopy;  Laterality: N/A;  100  . KNEE SURGERY     bilateral  . PACEMAKER IMPLANT N/A 07/17/2017   Procedure: Pacemaker Implant;  Surgeon: Deboraha Sprang, MD;  Location: [facility];   Service: Cardiovascular;  Laterality: N/A;    Social History   Socioeconomic History  . Marital status: Widowed    Spouse name: Not on file  . Number of children: Not on file  . Years of education: Not on file  . Highest education level: Not on file  Occupational History  . Not on file  Tobacco Use  . Smoking status: Never Smoker  . Smokeless tobacco: Never Used  Vaping Use  . Vaping Use: Never used  Substance and Sexual Activity  . Alcohol use: No  . Drug use: No  . Sexual activity: Never    Birth control/protection: Post-menopausal  Other Topics Concern  . Not on file  Social History Narrative  . Not on file   Social Determinants of Health   Financial Resource Strain:   . Difficulty of Paying Living Expenses:   Food Insecurity:   . Worried About Charity fundraiser in the Last Year:   . Arboriculturist in the Last Year:   Transportation Needs:   . Film/video editor (Medical):   Marland Kitchen Lack of Transportation (Non-Medical):   Physical Activity:   . Days of Exercise per Week:   . Minutes of Exercise per Session:   Stress:   . Feeling of Stress :   Social Connections:   . Frequency of Communication with Friends and Family:   . Frequency of  Social Gatherings with Friends and Family:   . Attends Religious Services:   . Active Member of Clubs or Organizations:   . Attends Archivist Meetings:   Marland Kitchen Marital Status:   Intimate Partner Violence:   . Fear of Current or Ex-Partner:   . Emotionally Abused:   Marland Kitchen Physically Abused:   . Sexually Abused:    Family History  Problem Relation Age of Onset  . Stroke Mother   . Pneumonia Father   . Heart disease Father   . Healthy Son   . Glaucoma Daughter   . Emphysema Daughter   . Healthy Daughter   . Other Daughter        had knee replacement  . Healthy Son   . Heart disease Maternal Grandmother       VITAL SIGNS BP 132/68   Pulse 62   Temp (!) 97.3 F (36.3 C) (Oral)   Resp 18   Ht 5\' 4"  (1.626 m)    Wt 133 lb 3.2 oz (60.4 kg)   SpO2 91%   BMI 22.86 kg/m   Outpatient Encounter Medications as of 05/26/2020  Medication Sig  . acetaminophen (TYLENOL) 650 MG CR tablet Take 650 mg by mouth every 8 (eight) hours.  Marland Kitchen albuterol (PROVENTIL HFA;VENTOLIN HFA) 108 (90 Base) MCG/ACT inhaler Inhale 2 puffs into the lungs at bedtime. For SOB or wheezing  . ANORO ELLIPTA 62.5-25 MCG/INH AEPB Inhale 1 puff into the lungs daily.   Marland Kitchen apixaban (ELIQUIS) 2.5 MG TABS tablet Take 1 tablet (2.5 mg total) by mouth 2 (two) times daily. Resume 07/20/17  . artificial tears (LACRILUBE) OINT ophthalmic ointment Place 1 application into both eyes at bedtime.  Marland Kitchen artificial tears (LACRILUBE) OINT ophthalmic ointment Place 1 application into both eyes daily as needed for dry eyes.  . cycloSPORINE (RESTASIS) 0.05 % ophthalmic emulsion Place 1 drop into both eyes every 12 (twelve) hours.   Marland Kitchen diltiazem (CARDIZEM CD) 180 MG 24 hr capsule TAKE ONE CAPSULE BY MOUTH ONCE DAILY.  . furosemide (LASIX) 20 MG tablet Take 20 mg by mouth 2 (two) times daily.   Marland Kitchen gabapentin (NEURONTIN) 100 MG capsule Take 100 mg by mouth 2 (two) times daily.   . hydrocortisone (ANUSOL-HC) 2.5 % rectal cream Place 1 application rectally 2 (two) times daily as needed. Apply topically for hemorrhoids as directed   . Melatonin 5 MG TABS Take 5 mg by mouth at bedtime as needed.   . Menthol, Topical Analgesic, (BIOFREEZE) 4 % GEL Apply 1 application topically in the morning, at noon, and at bedtime. Apply topically to left shoulder, left knee and neck for M/S pain  . NON FORMULARY Diet Type:  Regular  . Nutritional Supplements (ENSURE ENLIVE PO) Take 237 mLs by mouth daily.   . OXYGEN Inhale 2 L into the lungs at bedtime.   . pantoprazole (PROTONIX) 40 MG tablet Take 40 mg by mouth daily.   Vladimir Faster Glycol-Propyl Glycol (SYSTANE OP) Place 1 drop into both eyes 2 (two) times daily. For dry eyes  Administer 15 minutes after administering Restasis eye  drops.  . polyethylene glycol (MIRALAX / GLYCOLAX) packet Take 17 g by mouth daily as needed. For constipation  . traMADol (ULTRAM) 50 MG tablet Take 1 tablet (50 mg total) by mouth every 8 (eight) hours.  . [DISCONTINUED] Albuterol (VENTOLIN IN) Inhale into the lungs.     No facility-administered encounter medications on file as of 05/26/2020.     SIGNIFICANT DIAGNOSTIC EXAMS  PREVIOUS  05-09-20: KUB normal KUB  05-09-20: thoracic spine x-ray:Multilevel degenerative disc disease with dextroscoliosis.  05-09-20: lumbar spine x-ray:  Multilevel degenerative disc disease with no acute fracture.  NO NEW EXAMS.     LABS REVIEWED: PREVIOUS   07-02-19: glucose 91; bun 29; creat 0.96; k+ 4.3; na++ 141; ca 8.7 11-04-19: wbc 5.2; hgb 13.3; hct 42.1; mcv 92.3 plt 202; glucose 92; bun 20; creat 0.70; k+ 3.9; na++ 142; ca 8.8; liver normal albumin 3.5  04-25-20: wbc 5.5; hgb 12.3; hct 40.2 mcv 83.8 plt 219; glucose 96; bun 26; creat 0.94; k+ 4.2; na++ 138; ca 8.8 liver normal albumin 3.3    NO NEW LABS.   Review of Systems  Constitutional: Negative for malaise/fatigue.  Respiratory: Negative for cough and shortness of breath.   Cardiovascular: Negative for chest pain, palpitations and leg swelling.  Gastrointestinal: Negative for abdominal pain, constipation and heartburn.  Musculoskeletal: Negative for back pain, joint pain and myalgias.  Skin: Negative.   Neurological: Negative for dizziness.  Psychiatric/Behavioral: The patient is not nervous/anxious.     Physical Exam Constitutional:      General: She is not in acute distress.    Appearance: She is well-developed. She is not diaphoretic.  Neck:     Thyroid: No thyromegaly.  Cardiovascular:     Rate and Rhythm: Normal rate and regular rhythm.     Heart sounds: Normal heart sounds.     Comments: Pace maker  Pulmonary:     Effort: Pulmonary effort is normal. No respiratory distress.     Breath sounds: Normal breath sounds.    Abdominal:     General: Bowel sounds are normal. There is no distension.     Palpations: Abdomen is soft.     Tenderness: There is no abdominal tenderness.  Musculoskeletal:     Cervical back: Neck supple.     Right lower leg: Edema present.     Left lower leg: Edema present.     Comments:  Limited range of motion to left shoulder   2+ bilateral lower extremity       Lymphadenopathy:     Cervical: No cervical adenopathy.  Skin:    General: Skin is warm and dry.  Neurological:     Mental Status: She is alert and oriented to person, place, and time.  Psychiatric:        Mood and Affect: Mood normal.       ASSESSMENT/ PLAN:  TODAY  1. Cerebrovascular accident (CVA) unspecified mechanism 2. Chronic obstructive pulmonary disease unspecified COPD type 3. Chronic diastolic congestive heart failure  Will setup dental appointment  Will continue current medications Will continue to monitor her status  MOST form filled out: DNR; no tube feeding; limited hospitalization; ok for abt; ivf.   Time spent with patient and family 45 minutes: (30 minutes with advanced directives) MOST form filled out; verbalized understanding.     MD is aware of resident's narcotic use and is in agreement with current plan of care. We will attempt to wean resident as appropriate.  Ok Edwards NP [facility]  Contact [phone] Monday through Friday 8am- 5pm  After hours call [phone]

## 2020-05-30 DIAGNOSIS — R52 Pain, unspecified: Secondary | ICD-10-CM | POA: Insufficient documentation

## 2020-06-10 ENCOUNTER — Non-Acute Institutional Stay (SKILLED_NURSING_FACILITY): Payer: Medicare Other | Admitting: Adult Health

## 2020-06-10 ENCOUNTER — Encounter: Payer: Self-pay | Admitting: Adult Health

## 2020-06-10 DIAGNOSIS — N1832 Chronic kidney disease, stage 3b: Secondary | ICD-10-CM

## 2020-06-10 DIAGNOSIS — I5032 Chronic diastolic (congestive) heart failure: Secondary | ICD-10-CM | POA: Diagnosis not present

## 2020-06-10 DIAGNOSIS — I130 Hypertensive heart and chronic kidney disease with heart failure and stage 1 through stage 4 chronic kidney disease, or unspecified chronic kidney disease: Secondary | ICD-10-CM

## 2020-06-10 DIAGNOSIS — J449 Chronic obstructive pulmonary disease, unspecified: Secondary | ICD-10-CM

## 2020-06-10 DIAGNOSIS — R634 Abnormal weight loss: Secondary | ICD-10-CM

## 2020-06-10 NOTE — Progress Notes (Signed)
Location:    [place] Room Number: 108/W Place of Service:  SNF (31)   CODE STATUS: DNR  Allergies  Allergen Reactions  . Sulfur Swelling and Rash    Chief Complaint  Patient presents with  . Medical Management of Chronic Issues        Hypertensive heart and kidney disease with chronic diastolic congestive heart failure wiht stage 3 chronic kidney disease  Weight loss:  Chronic obstructive pulmonary disease     HPI:  She is a 86 year old long term resident of this facility being seen for the management of her chronic illnesses; hypertensive heart and kidney disease; weight loss and copd. No cough no shortness of breath. No uncontrolled pain; her weight is stable at this time.   Past Medical History:  Diagnosis Date  . Atrial fibrillation ([place])   . Bradycardia   . Breast nodule 11/15/2014  . Breast pain, left 11/15/2014  . Carotid artery disease ([place])   . Dizziness   . Dyspnea    Previous CPX suggesting possible restrictive physiology, respiratory muscle fatigue, diastolic dysfunction  . Essential hypertension, benign   . Hyperlipidemia   . Oxygen dependent    2 liter  . Pacemaker [employer]   . Rib pain on left side 11/15/2014  . Seasonal allergies   . Shingles 05/04/2015  . Stroke ([place])   . Toe fracture, left    left big toe    Past Surgical History:  Procedure Laterality Date  . COLONOSCOPY  02/22/2012   Procedure: COLONOSCOPY;  Surgeon: Rogene Houston, MD;  [facility];  Service: Endoscopy;  Laterality: N/A;  100  . KNEE SURGERY     bilateral  . PACEMAKER IMPLANT N/A 07/17/2017   Procedure: Pacemaker Implant;  Surgeon: Deboraha Sprang, MD;  Location: [facility];  Service: Cardiovascular;  Laterality: N/A;    Social History   Socioeconomic History  . Marital status: Widowed    Spouse name: Not on file  . Number of children: Not on file  . Years of education: Not on file  . Highest education level: Not on file    Occupational History  . Not on file  Tobacco Use  . Smoking status: Never Smoker  . Smokeless tobacco: Never Used  Vaping Use  . Vaping Use: Never used  Substance and Sexual Activity  . Alcohol use: No  . Drug use: No  . Sexual activity: Never    Birth control/protection: Post-menopausal  Other Topics Concern  . Not on file  Social History Narrative  . Not on file   Social Determinants of Health   Financial Resource Strain:   . Difficulty of Paying Living Expenses:   Food Insecurity:   . Worried About Charity fundraiser in the Last Year:   . Arboriculturist in the Last Year:   Transportation Needs:   . Film/video editor (Medical):   Marland Kitchen Lack of Transportation (Non-Medical):   Physical Activity:   . Days of Exercise per Week:   . Minutes of Exercise per Session:   Stress:   . Feeling of Stress :   Social Connections:   . Frequency of Communication with Friends and Family:   . Frequency of Social Gatherings with Friends and Family:   . Attends Religious Services:   . Active Member of Clubs or Organizations:   . Attends Archivist Meetings:   Marland Kitchen Marital Status:   Intimate  Partner Violence:   . Fear of Current or Ex-Partner:   . Emotionally Abused:   Marland Kitchen Physically Abused:   . Sexually Abused:    Family History  Problem Relation Age of Onset  . Stroke Mother   . Pneumonia Father   . Heart disease Father   . Healthy Son   . Glaucoma Daughter   . Emphysema Daughter   . Healthy Daughter   . Other Daughter        had knee replacement  . Healthy Son   . Heart disease Maternal Grandmother       VITAL SIGNS BP (!) 126/57   Pulse 63   Temp 98.1 F (36.7 C) (Oral)   Resp 18   Ht 5\' 4"  (1.626 m)   Wt 133 lb 3.2 oz (60.4 kg)   SpO2 97%   BMI 22.86 kg/m   Outpatient Encounter Medications as of 06/10/2020  Medication Sig  . acetaminophen (TYLENOL) 650 MG CR tablet Take 650 mg by mouth every 8 (eight) hours.  Marland Kitchen albuterol (PROVENTIL HFA;VENTOLIN  HFA) 108 (90 Base) MCG/ACT inhaler Inhale 2 puffs into the lungs at bedtime. For SOB or wheezing  . ANORO ELLIPTA 62.5-25 MCG/INH AEPB Inhale 1 puff into the lungs daily.   Marland Kitchen apixaban (ELIQUIS) 2.5 MG TABS tablet Take 1 tablet (2.5 mg total) by mouth 2 (two) times daily. Resume 07/20/17  . artificial tears (LACRILUBE) OINT ophthalmic ointment Place 1 application into both eyes at bedtime.  Marland Kitchen artificial tears (LACRILUBE) OINT ophthalmic ointment Place 1 application into both eyes daily as needed for dry eyes.  . cycloSPORINE (RESTASIS) 0.05 % ophthalmic emulsion Place 1 drop into both eyes every 12 (twelve) hours.   Marland Kitchen diltiazem (CARDIZEM CD) 180 MG 24 hr capsule TAKE ONE CAPSULE BY MOUTH ONCE DAILY.  . furosemide (LASIX) 20 MG tablet Take 20 mg by mouth 2 (two) times daily.   Marland Kitchen gabapentin (NEURONTIN) 100 MG capsule Take 100 mg by mouth 2 (two) times daily.   . hydrocortisone (ANUSOL-HC) 2.5 % rectal cream Place 1 application rectally 2 (two) times daily as needed. Apply topically for hemorrhoids as directed   . Melatonin 5 MG TABS Take 5 mg by mouth at bedtime as needed.   . Menthol, Topical Analgesic, (BIOFREEZE) 4 % GEL Apply 1 application topically in the morning, at noon, and at bedtime. Apply topically to left shoulder, left knee and neck for M/S pain  . NON FORMULARY Diet Type:  Regular  . Nutritional Supplements (ENSURE ENLIVE PO) Take 237 mLs by mouth daily.   . OXYGEN Inhale 2 L into the lungs at bedtime.   . pantoprazole (PROTONIX) 40 MG tablet Take 40 mg by mouth daily.   Vladimir Faster Glycol-Propyl Glycol (SYSTANE OP) Place 1 drop into both eyes 2 (two) times daily. For dry eyes  Administer 15 minutes after administering Restasis eye drops.  . polyethylene glycol (MIRALAX / GLYCOLAX) packet Take 17 g by mouth daily as needed. For constipation  . traMADol (ULTRAM) 50 MG tablet Take 1 tablet (50 mg total) by mouth every 8 (eight) hours.  . [DISCONTINUED] Albuterol (VENTOLIN IN) Inhale into  the lungs.     No facility-administered encounter medications on file as of 06/10/2020.     SIGNIFICANT DIAGNOSTIC EXAMS   PREVIOUS  05-09-20: KUB normal KUB  05-09-20: thoracic spine x-ray:Multilevel degenerative disc disease with dextroscoliosis.  05-09-20: lumbar spine x-ray:  Multilevel degenerative disc disease with no acute fracture.  NO NEW EXAMS.  LABS REVIEWED: PREVIOUS   07-02-19: glucose 91; bun 29; creat 0.96; k+ 4.3; na++ 141; ca 8.7 11-04-19: wbc 5.2; hgb 13.3; hct 42.1; mcv 92.3 plt 202; glucose 92; bun 20; creat 0.70; k+ 3.9; na++ 142; ca 8.8; liver normal albumin 3.5  04-25-20: wbc 5.5; hgb 12.3; hct 40.2 mcv 83.8 plt 219; glucose 96; bun 26; creat 0.94; k+ 4.2; na++ 138; ca 8.8 liver normal albumin 3.3    NO NEW LABS.   Review of Systems  Constitutional: Negative for malaise/fatigue.  Respiratory: Negative for cough and shortness of breath.   Cardiovascular: Negative for chest pain, palpitations and leg swelling.  Gastrointestinal: Negative for abdominal pain, constipation and heartburn.  Musculoskeletal: Negative for back pain, joint pain and myalgias.  Skin: Negative.   Neurological: Negative for dizziness.  Psychiatric/Behavioral: The patient is not nervous/anxious.       Physical Exam Constitutional:      General: She is not in acute distress.    Appearance: She is well-developed. She is not diaphoretic.  Neck:     Thyroid: No thyromegaly.  Cardiovascular:     Rate and Rhythm: Normal rate and regular rhythm.     Pulses: Normal pulses.     Heart sounds: Normal heart sounds.     Comments: Pace maker  Pulmonary:     Effort: Pulmonary effort is normal. No respiratory distress.     Breath sounds: Normal breath sounds.  Abdominal:     General: Bowel sounds are normal. There is no distension.     Palpations: Abdomen is soft.     Tenderness: There is no abdominal tenderness.  Musculoskeletal:     Cervical back: Neck supple.     Right lower leg:  Edema present.     Left lower leg: Edema present.     Comments: Limited range of motion to left shoulder   2+ bilateral lower extremity        Lymphadenopathy:     Cervical: No cervical adenopathy.  Skin:    General: Skin is warm and dry.  Neurological:     Mental Status: She is alert and oriented to person, place, and time.  Psychiatric:        Mood and Affect: Mood normal.       ASSESSMENT/ PLAN:  TODAY;   1. Hypertensive heart and kidney disease with chronic diastolic congestive heart failure wiht satge 3 chronic kidney disease is stable b/p 126/57 will continue cardizem cd 180 mg daily is off norvasc.   2. Weight loss: her weight is stable: 05-25-19: 137 pounds with her current weight of 133 pounds will monitor   3. Chronic obstructive pulmonary disease stable 02 dependent will continue anoro 62.5/25 mcg 1 puff daily albuterol 2 puffs every hs.   PREVIOUS  4. Dyslipidemia is stable is off lipitor will monitor  5. Chronic kidney disease stage 3 (moderate) is stable bun 20; creat 0.70 will monitor  6. GERD without  esophagitis: is stable will continue protonix 40 mg daily   7. Chronic bilateral low back pain without sciatica is stable will continue tylenol cr 650 mg three times  and ultram 50 mg three times daily has biofreeze to neck left knee  and left shoulder.   8. Peripheral neuropathy is stable will continue gabapentin 100 mg twice daily   9. CVA (cerebrovascular accident) is stable will continue eliquis 2.5 mg twice daily   10. Complete heart block is status post [employer] PTV/DP pace maker will monitor  11. Chronic constipation: is stable will continue miralax daily as needed  12. chronic diastolic heart failure: EF 60-65% (06-21-17) is stable will continue lasix 20 mg twice daily   13. Longstanding persistent atrial fibrillation is status post pace maker will continue cardizem cd 180 mg daily for rate control eliquis 2.5 mg twice daily         MD is  aware of resident's narcotic use and is in agreement with current plan of care. We will attempt to wean resident as appropriate.  Ok Edwards NP [facility]  Contact [phone] Monday through Friday 8am- 5pm  After hours call [phone]

## 2020-06-24 ENCOUNTER — Other Ambulatory Visit: Payer: Self-pay | Admitting: Adult Health

## 2020-06-24 MED ORDER — TRAMADOL HCL 50 MG PO TABS: 50.0000 mg | ORAL_TABLET | Freq: Three times a day (TID) | ORAL | 0 refills | Status: DC

## 2020-07-14 ENCOUNTER — Non-Acute Institutional Stay (SKILLED_NURSING_FACILITY): Payer: Medicare Other | Admitting: Adult Health

## 2020-07-14 ENCOUNTER — Encounter: Payer: Self-pay | Admitting: Adult Health

## 2020-07-14 DIAGNOSIS — N1831 Chronic kidney disease, stage 3a: Secondary | ICD-10-CM | POA: Diagnosis not present

## 2020-07-14 DIAGNOSIS — K219 Gastro-esophageal reflux disease without esophagitis: Secondary | ICD-10-CM | POA: Diagnosis not present

## 2020-07-14 DIAGNOSIS — G8929 Other chronic pain: Secondary | ICD-10-CM

## 2020-07-14 DIAGNOSIS — R52 Pain, unspecified: Secondary | ICD-10-CM

## 2020-07-14 NOTE — Progress Notes (Signed)
Location:    [facility] Room Number: 108/W Place of Service:  SNF (31)   CODE STATUS: DNR  Allergies  Allergen Reactions  . Sulfur Swelling and Rash    Chief Complaint  Patient presents with  . Medical Management of Chronic Issues           Chronic kidney disease stage 3 (moderate) GERD without esophagitis: Chronic generalized pain     HPI:  She is a 86 year old long term resident of this facility being seen for the management of her chronic illnesses: Chronic kidney disease stage 3 (moderate)   GERD without esophagitis:   Chronic generalized pain . She is having worsening bilateral leg pain with R>L. Has right bunion pain. No reports of heart burn; no reports of anxiety or agitation.   Past Medical History:  Diagnosis Date  . Atrial fibrillation ([place])   . Bradycardia   . Breast nodule 11/15/2014  . Breast pain, left 11/15/2014  . Carotid artery disease ([place])   . Dizziness   . Dyspnea    Previous CPX suggesting possible restrictive physiology, respiratory muscle fatigue, diastolic dysfunction  . Essential hypertension, benign   . Hyperlipidemia   . Oxygen dependent    2 liter  . Pacemaker [place]   . Rib pain on left side 11/15/2014  . Seasonal allergies   . Shingles 05/04/2015  . Stroke ([place])   . Toe fracture, left    left big toe    Past Surgical History:  Procedure Laterality Date  . COLONOSCOPY  02/22/2012   Procedure: COLONOSCOPY;  Surgeon: Rogene Houston, MD;  [facility];  Service: Endoscopy;  Laterality: N/A;  100  . KNEE SURGERY     bilateral  . PACEMAKER IMPLANT N/A 07/17/2017   Procedure: Pacemaker Implant;  Surgeon: Deboraha Sprang, MD;  Location: [facility];  Service: Cardiovascular;  Laterality: N/A;    Social History   Socioeconomic History  . Marital status: Widowed    Spouse name: Not on file  . Number of children: Not on file  . Years of education: Not on file  . Highest education level: Not on file   Occupational History  . Not on file  Tobacco Use  . Smoking status: Never Smoker  . Smokeless tobacco: Never Used  Vaping Use  . Vaping Use: Never used  Substance and Sexual Activity  . Alcohol use: No  . Drug use: No  . Sexual activity: Never    Birth control/protection: Post-menopausal  Other Topics Concern  . Not on file  Social History Narrative  . Not on file   Social Determinants of Health   Financial Resource Strain:   . Difficulty of Paying Living Expenses:   Food Insecurity:   . Worried About Charity fundraiser in the Last Year:   . Arboriculturist in the Last Year:   Transportation Needs:   . Film/video editor (Medical):   Marland Kitchen Lack of Transportation (Non-Medical):   Physical Activity:   . Days of Exercise per Week:   . Minutes of Exercise per Session:   Stress:   . Feeling of Stress :   Social Connections:   . Frequency of Communication with Friends and Family:   . Frequency of Social Gatherings with Friends and Family:   . Attends Religious Services:   . Active Member of Clubs or Organizations:   . Attends Archivist Meetings:   .  Marital Status:   Intimate Partner Violence:   . Fear of Current or Ex-Partner:   . Emotionally Abused:   Marland Kitchen Physically Abused:   . Sexually Abused:    Family History  Problem Relation Age of Onset  . Stroke Mother   . Pneumonia Father   . Heart disease Father   . Healthy Son   . Glaucoma Daughter   . Emphysema Daughter   . Healthy Daughter   . Other Daughter        had knee replacement  . Healthy Son   . Heart disease Maternal Grandmother       VITAL SIGNS BP 118/70   Pulse 68   Temp 98 F (36.7 C) (Oral)   Resp 18   Ht 5\' 4"  (1.626 m)   Wt 134 lb 3.2 oz (60.9 kg)   SpO2 94%   BMI 23.04 kg/m   Outpatient Encounter Medications as of 07/14/2020  Medication Sig  . acetaminophen (TYLENOL) 650 MG CR tablet Take 650 mg by mouth every 8 (eight) hours.  Marland Kitchen albuterol (PROVENTIL HFA;VENTOLIN HFA) 108  (90 Base) MCG/ACT inhaler Inhale 2 puffs into the lungs at bedtime. For SOB or wheezing  . ANORO ELLIPTA 62.5-25 MCG/INH AEPB Inhale 1 puff into the lungs daily.   Marland Kitchen apixaban (ELIQUIS) 2.5 MG TABS tablet Take 1 tablet (2.5 mg total) by mouth 2 (two) times daily. Resume 07/20/17  . artificial tears (LACRILUBE) OINT ophthalmic ointment Place 1 application into both eyes at bedtime.  Marland Kitchen artificial tears (LACRILUBE) OINT ophthalmic ointment Place 1 application into both eyes daily as needed for dry eyes.  . cycloSPORINE (RESTASIS) 0.05 % ophthalmic emulsion Place 1 drop into both eyes every 12 (twelve) hours.   Marland Kitchen diltiazem (CARDIZEM CD) 180 MG 24 hr capsule TAKE ONE CAPSULE BY MOUTH ONCE DAILY.  . furosemide (LASIX) 20 MG tablet Take 20 mg by mouth 2 (two) times daily.   Marland Kitchen gabapentin (NEURONTIN) 100 MG capsule Take 100 mg by mouth 2 (two) times daily.   . hydrocortisone (ANUSOL-HC) 2.5 % rectal cream Place 1 application rectally 2 (two) times daily as needed. Apply topically for hemorrhoids as directed   . Melatonin 5 MG TABS Take 5 mg by mouth at bedtime as needed.   . Menthol, Topical Analgesic, (BIOFREEZE) 4 % GEL Apply 1 application topically in the morning, at noon, and at bedtime. Apply topically to left shoulder, left knee and neck for M/S pain  . NON FORMULARY Diet Type:  Regular  . Nutritional Supplements (ENSURE ENLIVE PO) Take 237 mLs by mouth daily.   . OXYGEN Inhale 2 L into the lungs at bedtime.   . pantoprazole (PROTONIX) 40 MG tablet Take 40 mg by mouth daily.   Vladimir Faster Glycol-Propyl Glycol (SYSTANE OP) Place 1 drop into both eyes 2 (two) times daily. For dry eyes  Administer 15 minutes after administering Restasis eye drops.  . polyethylene glycol (MIRALAX / GLYCOLAX) packet Take 17 g by mouth daily as needed. For constipation  . traMADol (ULTRAM) 50 MG tablet Take 1 tablet (50 mg total) by mouth every 8 (eight) hours.  . [DISCONTINUED] Albuterol (VENTOLIN IN) Inhale into the lungs.      No facility-administered encounter medications on file as of 07/14/2020.     SIGNIFICANT DIAGNOSTIC EXAMS   PREVIOUS  05-09-20: KUB normal KUB  05-09-20: thoracic spine x-ray:Multilevel degenerative disc disease with dextroscoliosis.  05-09-20: lumbar spine x-ray:  Multilevel degenerative disc disease with no acute fracture.  NO NEW EXAMS.     LABS REVIEWED: PREVIOUS   11-04-19: wbc 5.2; hgb 13.3; hct 42.1; mcv 92.3 plt 202; glucose 92; bun 20; creat 0.70; k+ 3.9; na++ 142; ca 8.8; liver normal albumin 3.5  04-25-20: wbc 5.5; hgb 12.3; hct 40.2 mcv 83.8 plt 219; glucose 96; bun 26; creat 0.94; k+ 4.2; na++ 138; ca 8.8 liver normal albumin 3.3    NO NEW LABS.   Review of Systems  Constitutional: Negative for malaise/fatigue.  Respiratory: Negative for cough and shortness of breath.   Cardiovascular: Positive for leg swelling. Negative for chest pain and palpitations.  Gastrointestinal: Negative for abdominal pain, constipation and heartburn.  Musculoskeletal: Positive for joint pain. Negative for back pain and myalgias.       Has chronic bilateral lower extremity pain  Right bunion pain   Skin: Negative.   Neurological: Negative for dizziness.  Psychiatric/Behavioral: The patient is not nervous/anxious.     Physical Exam Constitutional:      General: She is not in acute distress.    Appearance: She is well-developed. She is not diaphoretic.  Neck:     Thyroid: No thyromegaly.  Cardiovascular:     Rate and Rhythm: Normal rate and regular rhythm.     Heart sounds: Normal heart sounds.     Comments: Pace maker  Pulmonary:     Effort: Pulmonary effort is normal. No respiratory distress.     Breath sounds: Normal breath sounds.  Abdominal:     General: Bowel sounds are normal. There is no distension.     Palpations: Abdomen is soft.     Tenderness: There is no abdominal tenderness.  Musculoskeletal:     Cervical back: Neck supple.     Right lower leg: Edema present.      Left lower leg: Edema present.     Comments: Limited range of motion to left shoulder   1-2+ bilateral lower extremity         Lymphadenopathy:     Cervical: No cervical adenopathy.  Skin:    General: Skin is warm and dry.  Neurological:     Mental Status: She is alert and oriented to person, place, and time.  Psychiatric:        Mood and Affect: Mood normal.     ASSESSMENT/ PLAN:  TODAY;   1. Chronic kidney disease stage 3 (moderate) is stable bun 26 creat 0.94 will monitor   2 GERD without esophagitis: is stable will continue protonix 40 mg daily   3. Chronic generalized pain  is having worsening leg pain will change to tylenol cr 650 mg every 6 hours with ultram 50 mg every 6 hours will monitor her status.    PREVIOUS  4. Dyslipidemia is stable is off lipitor will monitor  5. Peripheral neuropathy is stable will continue gabapentin 100 mg twice daily   6. CVA (cerebrovascular accident) is stable will continue eliquis 2.5 mg twice daily   7. Complete heart block is status post [employer] PTV/DP pace maker will monitor   8. Chronic constipation: is stable will continue miralax daily as needed  10. chronic diastolic heart failure: EF 60-65% (06-21-17) is stable will continue lasix 20 mg twice daily   11. Longstanding persistent atrial fibrillation is status post pace maker will continue cardizem cd 180 mg daily for rate control eliquis 2.5 mg twice daily   12. Hypertensive heart and kidney disease with chronic diastolic congestive heart failure wiht satge 3 chronic kidney disease is  stable b/p 118/70 will continue cardizem cd 180 mg daily is off norvasc.   13. Weight loss: her weight is stable: 05-25-19: 137 pounds with her current weight of 134 pounds will monitor   14. Chronic obstructive pulmonary disease stable 02 dependent will continue anoro 62.5/25 mcg 1 puff daily albuterol 2 puffs every hs.          MD is aware of resident's narcotic use and is in  agreement with current plan of care. We will attempt to wean resident as appropriate.  Ok Edwards NP [facility]  Contact [phone] Monday through Friday 8am- 5pm  After hours call [phone]

## 2020-07-15 ENCOUNTER — Other Ambulatory Visit: Payer: Self-pay | Admitting: Adult Health

## 2020-07-15 MED ORDER — TRAMADOL HCL 50 MG PO TABS: 50.0000 mg | ORAL_TABLET | Freq: Four times a day (QID) | ORAL | 0 refills | Status: DC

## 2020-07-19 ENCOUNTER — Ambulatory Visit: Payer: Medicare Other

## 2020-07-27 LAB — CUP PACEART REMOTE DEVICE CHECK
Battery Remaining Longevity: 146 mo
Battery Remaining Percentage: 95.5 %
Battery Voltage: 3.01 V
Brady Statistic RV Percent Paced: 99 %
Date Time Interrogation Session: 20210810020013
Implantable Lead Implant Date: 20180808
Implantable Lead Location: 753860
Implantable Lead Model: 1948
Implantable Pulse Generator Implant Date: 20180808
Lead Channel Impedance Value: 580 Ohm
Lead Channel Pacing Threshold Amplitude: 0.5 V
Lead Channel Pacing Threshold Pulse Width: 0.5 ms
Lead Channel Sensing Intrinsic Amplitude: 4.5 mV
Lead Channel Setting Pacing Amplitude: 0.75 V
Lead Channel Setting Pacing Pulse Width: 0.5 ms
Lead Channel Setting Sensing Sensitivity: 0.5 mV
Pulse Gen Model: 1272
Pulse Gen Serial Number: 7957820

## 2020-07-28 ENCOUNTER — Encounter: Payer: Self-pay | Admitting: Adult Health

## 2020-07-28 ENCOUNTER — Non-Acute Institutional Stay (SKILLED_NURSING_FACILITY): Payer: Medicare Other | Admitting: Adult Health

## 2020-07-28 DIAGNOSIS — I5032 Chronic diastolic (congestive) heart failure: Secondary | ICD-10-CM

## 2020-07-28 DIAGNOSIS — G8929 Other chronic pain: Secondary | ICD-10-CM | POA: Diagnosis not present

## 2020-07-28 DIAGNOSIS — R52 Pain, unspecified: Secondary | ICD-10-CM | POA: Diagnosis not present

## 2020-07-28 DIAGNOSIS — G588 Other specified mononeuropathies: Secondary | ICD-10-CM

## 2020-07-28 NOTE — Progress Notes (Signed)
Location:    [facility] Room Number: 294T Place of Service:  SNF (31)   CODE STATUS: DNR  Allergies  Allergen Reactions  . Sulfur Swelling and Rash    Chief Complaint  Patient presents with  . Acute Visit    Family Concerns    HPI:  She is having bilateral lower extremity pain; it is tingling with the left leg worse than the right. She has bilateral lower extremity edema. She feels like she has a knot on her left foot from the edema. She is wearing ted hose and is due to see the foot doctor in the next week. Her family is concerned about her lower extremity edema and her pain. She is presently taking tylenol; biofreeze and gabapentin for her pain management. She tells me that she is not getting adequate pain relief.   Past Medical History:  Diagnosis Date  . Atrial fibrillation ([place])   . Bradycardia   . Breast nodule 11/15/2014  . Breast pain, left 11/15/2014  . Carotid artery disease ([place])   . Dizziness   . Dyspnea    Previous CPX suggesting possible restrictive physiology, respiratory muscle fatigue, diastolic dysfunction  . Essential hypertension, benign   . Hyperlipidemia   . Oxygen dependent    2 liter  . Pacemaker [place]   . Rib pain on left side 11/15/2014  . Seasonal allergies   . Shingles 05/04/2015  . Stroke ([place])   . Toe fracture, left    left big toe    Past Surgical History:  Procedure Laterality Date  . COLONOSCOPY  02/22/2012   Procedure: COLONOSCOPY;  Surgeon: Rogene Houston, MD;  [facility];  Service: Endoscopy;  Laterality: N/A;  100  . KNEE SURGERY     bilateral  . PACEMAKER IMPLANT N/A 07/17/2017   Procedure: Pacemaker Implant;  Surgeon: Deboraha Sprang, MD;  Location: [facility];  Service: Cardiovascular;  Laterality: N/A;    Social History   Socioeconomic History  . Marital status: Widowed    Spouse name: Not on file  . Number of children: Not on file  . Years of education: Not on file  .  Highest education level: Not on file  Occupational History  . Not on file  Tobacco Use  . Smoking status: Never Smoker  . Smokeless tobacco: Never Used  Vaping Use  . Vaping Use: Never used  Substance and Sexual Activity  . Alcohol use: No  . Drug use: No  . Sexual activity: Never    Birth control/protection: Post-menopausal  Other Topics Concern  . Not on file  Social History Narrative  . Not on file   Social Determinants of Health   Financial Resource Strain:   . Difficulty of Paying Living Expenses: Not on file  Food Insecurity:   . Worried About Charity fundraiser in the Last Year: Not on file  . Ran Out of Food in the Last Year: Not on file  Transportation Needs:   . Lack of Transportation (Medical): Not on file  . Lack of Transportation (Non-Medical): Not on file  Physical Activity:   . Days of Exercise per Week: Not on file  . Minutes of Exercise per Session: Not on file  Stress:   . Feeling of Stress : Not on file  Social Connections:   . Frequency of Communication with Friends and Family: Not on file  . Frequency of Social Gatherings with Friends and Family: Not  on file  . Attends Religious Services: Not on file  . Active Member of Clubs or Organizations: Not on file  . Attends Archivist Meetings: Not on file  . Marital Status: Not on file  Intimate Partner Violence:   . Fear of Current or Ex-Partner: Not on file  . Emotionally Abused: Not on file  . Physically Abused: Not on file  . Sexually Abused: Not on file   Family History  Problem Relation Age of Onset  . Stroke Mother   . Pneumonia Father   . Heart disease Father   . Healthy Son   . Glaucoma Daughter   . Emphysema Daughter   . Healthy Daughter   . Other Daughter        had knee replacement  . Healthy Son   . Heart disease Maternal Grandmother       VITAL SIGNS BP (!) 184/62   Pulse 63   Temp (!) 97.2 F (36.2 C) (Oral)   Resp 18   Ht 5\' 4"  (1.626 m)   Wt 134 lb 3.2 oz  (60.9 kg)   SpO2 95%   BMI 23.04 kg/m   Outpatient Encounter Medications as of 07/28/2020  Medication Sig  . acetaminophen (TYLENOL) 650 MG CR tablet Take 650 mg by mouth every 6 (six) hours.   Marland Kitchen albuterol (PROVENTIL HFA;VENTOLIN HFA) 108 (90 Base) MCG/ACT inhaler Inhale 2 puffs into the lungs at bedtime. For SOB or wheezing  . ANORO ELLIPTA 62.5-25 MCG/INH AEPB Inhale 1 puff into the lungs daily.   Marland Kitchen apixaban (ELIQUIS) 2.5 MG TABS tablet Take 1 tablet (2.5 mg total) by mouth 2 (two) times daily. Resume 07/20/17  . artificial tears (LACRILUBE) OINT ophthalmic ointment Place 1 application into both eyes at bedtime.  Marland Kitchen artificial tears (LACRILUBE) OINT ophthalmic ointment Place 1 application into both eyes daily as needed for dry eyes.  . cycloSPORINE (RESTASIS) 0.05 % ophthalmic emulsion Place 1 drop into both eyes every 12 (twelve) hours.   Marland Kitchen diltiazem (CARDIZEM CD) 180 MG 24 hr capsule TAKE ONE CAPSULE BY MOUTH ONCE DAILY.  . furosemide (LASIX) 20 MG tablet Take 20 mg by mouth 2 (two) times daily.   Marland Kitchen gabapentin (NEURONTIN) 100 MG capsule Take 100 mg by mouth 3 (three) times daily.   . hydrocortisone (ANUSOL-HC) 2.5 % rectal cream Place 1 application rectally 2 (two) times daily as needed. Apply topically for hemorrhoids as directed   . Melatonin 5 MG TABS Take 5 mg by mouth at bedtime as needed.   . Menthol, Topical Analgesic, (BIOFREEZE) 4 % GEL Apply 1 application topically in the morning, at noon, and at bedtime. Apply topically to left shoulder, left knee and neck for M/S pain  . NON FORMULARY Diet Type:  Regular  . Nutritional Supplements (ENSURE ENLIVE PO) Take 237 mLs by mouth daily.   . OXYGEN Inhale 2 L into the lungs at bedtime.   . pantoprazole (PROTONIX) 40 MG tablet Take 40 mg by mouth daily.   Vladimir Faster Glycol-Propyl Glycol (SYSTANE OP) Place 1 drop into both eyes 2 (two) times daily. For dry eyes  Administer 15 minutes after administering Restasis eye drops.  . polyethylene  glycol (MIRALAX / GLYCOLAX) packet Take 17 g by mouth daily as needed. For constipation  . traMADol (ULTRAM) 50 MG tablet Take 50 mg by mouth every 8 (eight) hours.  . [DISCONTINUED] Albuterol (VENTOLIN IN) Inhale into the lungs.    . [DISCONTINUED] traMADol (ULTRAM) 50 MG tablet  Take 1 tablet (50 mg total) by mouth every 6 (six) hours.   No facility-administered encounter medications on file as of 07/28/2020.     SIGNIFICANT DIAGNOSTIC EXAMS   PREVIOUS  05-09-20: KUB normal KUB  05-09-20: thoracic spine x-ray:Multilevel degenerative disc disease with dextroscoliosis.  05-09-20: lumbar spine x-ray:  Multilevel degenerative disc disease with no acute fracture.  NO NEW EXAMS.     LABS REVIEWED: PREVIOUS   11-04-19: wbc 5.2; hgb 13.3; hct 42.1; mcv 92.3 plt 202; glucose 92; bun 20; creat 0.70; k+ 3.9; na++ 142; ca 8.8; liver normal albumin 3.5  04-25-20: wbc 5.5; hgb 12.3; hct 40.2 mcv 83.8 plt 219; glucose 96; bun 26; creat 0.94; k+ 4.2; na++ 138; ca 8.8 liver normal albumin 3.3    NO NEW LABS.   Review of Systems  Constitutional: Negative for malaise/fatigue.  Respiratory: Negative for cough and shortness of breath.   Cardiovascular: Positive for leg swelling. Negative for chest pain and palpitations.  Gastrointestinal: Negative for abdominal pain, constipation and heartburn.  Musculoskeletal: Positive for myalgias. Negative for back pain and joint pain.       Has chronic bilateral leg pain   Skin: Negative.   Neurological: Positive for tingling. Negative for dizziness.  Psychiatric/Behavioral: The patient is not nervous/anxious.     Physical Exam Constitutional:      General: She is not in acute distress.    Appearance: She is well-developed. She is not diaphoretic.  Neck:     Thyroid: No thyromegaly.  Cardiovascular:     Rate and Rhythm: Normal rate and regular rhythm.     Heart sounds: Normal heart sounds.     Comments: Pace maker  Pulmonary:     Effort: Pulmonary  effort is normal. No respiratory distress.     Breath sounds: Normal breath sounds.  Abdominal:     General: Bowel sounds are normal. There is no distension.     Palpations: Abdomen is soft.     Tenderness: There is no abdominal tenderness.  Musculoskeletal:     Cervical back: Neck supple.     Right lower leg: Edema present.     Left lower leg: Edema present.     Comments: Limited range of motion to left shoulder   2+ bilateral lower extremity          Lymphadenopathy:     Cervical: No cervical adenopathy.  Skin:    General: Skin is warm and dry.  Neurological:     Mental Status: She is alert and oriented to person, place, and time.  Psychiatric:        Mood and Affect: Mood normal.      ASSESSMENT/ PLAN:  TODAY  1. Chronic diastolic heart failure 2. Other  mononeuropathy  3. Chronic generalized pain  Her pain is not managed Will increase to gabapentin 100 mg three times daily  Will continue to monitor her status.     MD is aware of resident's narcotic use and is in agreement with current plan of care. We will attempt to wean resident as appropriate.  Ok Edwards NP [facility]  Contact [phone] Monday through Friday 8am- 5pm  After hours call [phone]

## 2020-08-05 ENCOUNTER — Non-Acute Institutional Stay: Payer: Self-pay | Admitting: Adult Health

## 2020-08-05 ENCOUNTER — Encounter: Payer: Self-pay | Admitting: Adult Health

## 2020-08-05 DIAGNOSIS — R52 Pain, unspecified: Secondary | ICD-10-CM

## 2020-08-05 DIAGNOSIS — G588 Other specified mononeuropathies: Secondary | ICD-10-CM

## 2020-08-08 NOTE — Progress Notes (Signed)
Location:   [facility] Room Number: 108/W Place of Service:  SNF (31)   CODE STATUS: dnr  Allergies  Allergen Reactions  . Sulfur Swelling and Rash    Chief Complaint  Patient presents with  . Acute Visit    medication review     HPI:  She is presently taking ultram 50 mg every 8 hours; and gabapentin 100 mg three times daily. There are no reports of uncontrolled pain; no reports of insomnia. No reports changes in appetite. She is due for a dose reduction.   Past Medical History:  Diagnosis Date  . Atrial fibrillation ([place])   . Bradycardia   . Breast nodule 11/15/2014  . Breast pain, left 11/15/2014  . Carotid artery disease ([place])   . Dizziness   . Dyspnea    Previous CPX suggesting possible restrictive physiology, respiratory muscle fatigue, diastolic dysfunction  . Essential hypertension, benign   . Hyperlipidemia   . Oxygen dependent    2 liter  . Pacemaker [place]   . Rib pain on left side 11/15/2014  . Seasonal allergies   . Shingles 05/04/2015  . Stroke ([place])   . Toe fracture, left    left big toe    Past Surgical History:  Procedure Laterality Date  . COLONOSCOPY  02/22/2012   Procedure: COLONOSCOPY;  Surgeon: Rogene Houston, MD;  [facility];  Service: Endoscopy;  Laterality: N/A;  100  . KNEE SURGERY     bilateral  . PACEMAKER IMPLANT N/A 07/17/2017   Procedure: Pacemaker Implant;  Surgeon: Deboraha Sprang, MD;  Location: [facility];  Service: Cardiovascular;  Laterality: N/A;    Social History   Socioeconomic History  . Marital status: Widowed    Spouse name: Not on file  . Number of children: Not on file  . Years of education: Not on file  . Highest education level: Not on file  Occupational History  . Not on file  Tobacco Use  . Smoking status: Never Smoker  . Smokeless tobacco: Never Used  Vaping Use  . Vaping Use: Never used  Substance and Sexual Activity  . Alcohol use: No  . Drug use: No  . Sexual  activity: Never    Birth control/protection: Post-menopausal  Other Topics Concern  . Not on file  Social History Narrative  . Not on file   Social Determinants of Health   Financial Resource Strain:   . Difficulty of Paying Living Expenses: Not on file  Food Insecurity:   . Worried About Charity fundraiser in the Last Year: Not on file  . Ran Out of Food in the Last Year: Not on file  Transportation Needs:   . Lack of Transportation (Medical): Not on file  . Lack of Transportation (Non-Medical): Not on file  Physical Activity:   . Days of Exercise per Week: Not on file  . Minutes of Exercise per Session: Not on file  Stress:   . Feeling of Stress : Not on file  Social Connections:   . Frequency of Communication with Friends and Family: Not on file  . Frequency of Social Gatherings with Friends and Family: Not on file  . Attends Religious Services: Not on file  . Active Member of Clubs or Organizations: Not on file  . Attends Archivist Meetings: Not on file  . Marital Status: Not on file  Intimate Partner Violence:   . Fear of Current or Ex-Partner:  Not on file  . Emotionally Abused: Not on file  . Physically Abused: Not on file  . Sexually Abused: Not on file   Family History  Problem Relation Age of Onset  . Stroke Mother   . Pneumonia Father   . Heart disease Father   . Healthy Son   . Glaucoma Daughter   . Emphysema Daughter   . Healthy Daughter   . Other Daughter        had knee replacement  . Healthy Son   . Heart disease Maternal Grandmother       VITAL SIGNS BP (!) 160/77   Pulse 61   Temp 97.7 F (36.5 C) (Oral)   Resp 18   Ht 5\' 4"  (1.626 m)   Wt 134 lb 3.2 oz (60.9 kg)   SpO2 97%   BMI 23.04 kg/m   Outpatient Encounter Medications as of 08/05/2020  Medication Sig  . acetaminophen (TYLENOL) 650 MG CR tablet Take 650 mg by mouth every 6 (six) hours.   Marland Kitchen albuterol (PROVENTIL HFA;VENTOLIN HFA) 108 (90 Base) MCG/ACT inhaler Inhale 2  puffs into the lungs at bedtime. For SOB or wheezing  . ANORO ELLIPTA 62.5-25 MCG/INH AEPB Inhale 1 puff into the lungs daily.   Marland Kitchen apixaban (ELIQUIS) 2.5 MG TABS tablet Take 1 tablet (2.5 mg total) by mouth 2 (two) times daily. Resume 07/20/17  . artificial tears (LACRILUBE) OINT ophthalmic ointment Place 1 application into both eyes at bedtime.  Marland Kitchen artificial tears (LACRILUBE) OINT ophthalmic ointment Place 1 application into both eyes daily as needed for dry eyes.  . cycloSPORINE (RESTASIS) 0.05 % ophthalmic emulsion Place 1 drop into both eyes every 12 (twelve) hours.   Marland Kitchen diltiazem (CARDIZEM CD) 180 MG 24 hr capsule TAKE ONE CAPSULE BY MOUTH ONCE DAILY.  . furosemide (LASIX) 20 MG tablet Take 20 mg by mouth 2 (two) times daily.   Marland Kitchen gabapentin (NEURONTIN) 100 MG capsule Take 100 mg by mouth 3 (three) times daily.   . hydrocortisone (ANUSOL-HC) 2.5 % rectal cream Place 1 application rectally 2 (two) times daily as needed. Apply topically for hemorrhoids as directed   . Melatonin 5 MG TABS Take 5 mg by mouth at bedtime as needed.   . Menthol, Topical Analgesic, (BIOFREEZE) 4 % GEL Apply 1 application topically in the morning, at noon, and at bedtime. Apply topically to left shoulder, left knee and neck for M/S pain  . NON FORMULARY Diet Type:  Regular  . Nutritional Supplements (ENSURE ENLIVE PO) Take 237 mLs by mouth daily.   . OXYGEN Inhale 2 L into the lungs at bedtime.   . pantoprazole (PROTONIX) 40 MG tablet Take 40 mg by mouth daily.   Vladimir Faster Glycol-Propyl Glycol (SYSTANE OP) Place 1 drop into both eyes 2 (two) times daily. For dry eyes  Administer 15 minutes after administering Restasis eye drops.  . polyethylene glycol (MIRALAX / GLYCOLAX) packet Take 17 g by mouth daily as needed. For constipation  . traMADol (ULTRAM) 50 MG tablet Take 50 mg by mouth every 8 (eight) hours.  . [DISCONTINUED] Albuterol (VENTOLIN IN) Inhale into the lungs.     No facility-administered encounter  medications on file as of 08/05/2020.     SIGNIFICANT DIAGNOSTIC EXAMS   PREVIOUS  05-09-20: KUB normal KUB  05-09-20: thoracic spine x-ray:Multilevel degenerative disc disease with dextroscoliosis.  05-09-20: lumbar spine x-ray:  Multilevel degenerative disc disease with no acute fracture.  NO NEW EXAMS.     LABS  REVIEWED: PREVIOUS   11-04-19: wbc 5.2; hgb 13.3; hct 42.1; mcv 92.3 plt 202; glucose 92; bun 20; creat 0.70; k+ 3.9; na++ 142; ca 8.8; liver normal albumin 3.5  04-25-20: wbc 5.5; hgb 12.3; hct 40.2 mcv 83.8 plt 219; glucose 96; bun 26; creat 0.94; k+ 4.2; na++ 138; ca 8.8 liver normal albumin 3.3    NO NEW LABS.   Review of Systems  Constitutional: Negative for malaise/fatigue.  Respiratory: Negative for cough and shortness of breath.   Cardiovascular: Negative for chest pain, palpitations and leg swelling.  Gastrointestinal: Negative for abdominal pain, constipation and heartburn.  Musculoskeletal: Negative for back pain, joint pain and myalgias.  Skin: Negative.   Neurological: Negative for dizziness.  Psychiatric/Behavioral: The patient is not nervous/anxious.     Physical Exam Constitutional:      General: She is not in acute distress.    Appearance: She is well-developed. She is not diaphoretic.  Neck:     Thyroid: No thyromegaly.  Cardiovascular:     Rate and Rhythm: Normal rate and regular rhythm.     Pulses: Normal pulses.     Heart sounds: Normal heart sounds.     Comments: Pace maker  Pulmonary:     Effort: Pulmonary effort is normal. No respiratory distress.     Breath sounds: Normal breath sounds.  Abdominal:     General: Bowel sounds are normal. There is no distension.     Palpations: Abdomen is soft.     Tenderness: There is no abdominal tenderness.  Musculoskeletal:     Cervical back: Neck supple.     Right lower leg: Edema present.     Left lower leg: Edema present.     Comments: Limited range of motion to left shoulder   2+ bilateral  lower extremity           Lymphadenopathy:     Cervical: No cervical adenopathy.  Skin:    General: Skin is warm and dry.  Neurological:     Mental Status: She is alert and oriented to person, place, and time.  Psychiatric:        Mood and Affect: Mood normal.       ASSESSMENT/ PLAN:  TODAY  1. Other mononeuropathy 2. Chronic generalized pain  Will continue ultram 50 mgm every 8 hours Will lower gabapentin to 100 mg twice daily  Will monitor her status.    MD is aware of resident's narcotic use and is in agreement with current plan of care. We will attempt to wean resident as appropriate.  Ok Edwards NP [facility]  Contact [phone] Monday through Friday 8am- 5pm  After hours call [phone]

## 2020-08-09 ENCOUNTER — Other Ambulatory Visit: Payer: Self-pay | Admitting: Adult Health

## 2020-08-09 MED ORDER — TRAMADOL HCL 50 MG PO TABS: 50.0000 mg | ORAL_TABLET | Freq: Three times a day (TID) | ORAL | 0 refills | Status: DC

## 2020-08-17 ENCOUNTER — Encounter: Payer: Self-pay | Admitting: Adult Health

## 2020-08-17 ENCOUNTER — Non-Acute Institutional Stay (SKILLED_NURSING_FACILITY): Payer: Medicare Other | Admitting: Adult Health

## 2020-08-17 DIAGNOSIS — I442 Atrioventricular block, complete: Secondary | ICD-10-CM | POA: Diagnosis not present

## 2020-08-17 DIAGNOSIS — G588 Other specified mononeuropathies: Secondary | ICD-10-CM | POA: Diagnosis not present

## 2020-08-17 DIAGNOSIS — I639 Cerebral infarction, unspecified: Secondary | ICD-10-CM

## 2020-08-17 NOTE — Progress Notes (Signed)
Location:    [facility] Room Number: 108/W Place of Service:  SNF (31)   CODE STATUS: DNR  Allergies  Allergen Reactions  . Sulfur Swelling and Rash    Chief Complaint  Patient presents with  . Medical Management of Chronic Issues             Peripheral neuropathy:CVA (cerebrovascular accident)  Complete heart block     HPI:  She is a 86 year old long term resident of this facility being seen for the management of her chronic illnesses; Peripheral neuropathy:CVA (cerebrovascular accident)  Complete heart block. Her pain is presently being management. There are no reports of agitation no reports of anxiety; no reports of insomnia present.   Past Medical History:  Diagnosis Date  . Atrial fibrillation ([place])   . Bradycardia   . Breast nodule 11/15/2014  . Breast pain, left 11/15/2014  . Carotid artery disease ([place])   . Dizziness   . Dyspnea    Previous CPX suggesting possible restrictive physiology, respiratory muscle fatigue, diastolic dysfunction  . Essential hypertension, benign   . Hyperlipidemia   . Oxygen dependent    2 liter  . Pacemaker [place]   . Rib pain on left side 11/15/2014  . Seasonal allergies   . Shingles 05/04/2015  . Stroke ([place])   . Toe fracture, left    left big toe    Past Surgical History:  Procedure Laterality Date  . COLONOSCOPY  02/22/2012   Procedure: COLONOSCOPY;  Surgeon: Rogene Houston, MD;  [facility];  Service: Endoscopy;  Laterality: N/A;  100  . KNEE SURGERY     bilateral  . PACEMAKER IMPLANT N/A 07/17/2017   Procedure: Pacemaker Implant;  Surgeon: Deboraha Sprang, MD;  Location: [facility];  Service: Cardiovascular;  Laterality: N/A;    Social History   Socioeconomic History  . Marital status: Widowed    Spouse name: Not on file  . Number of children: Not on file  . Years of education: Not on file  . Highest education level: Not on file  Occupational History  . Not on file    Tobacco Use  . Smoking status: Never Smoker  . Smokeless tobacco: Never Used  Vaping Use  . Vaping Use: Never used  Substance and Sexual Activity  . Alcohol use: No  . Drug use: No  . Sexual activity: Never    Birth control/protection: Post-menopausal  Other Topics Concern  . Not on file  Social History Narrative  . Not on file   Social Determinants of Health   Financial Resource Strain:   . Difficulty of Paying Living Expenses: Not on file  Food Insecurity:   . Worried About Charity fundraiser in the Last Year: Not on file  . Ran Out of Food in the Last Year: Not on file  Transportation Needs:   . Lack of Transportation (Medical): Not on file  . Lack of Transportation (Non-Medical): Not on file  Physical Activity:   . Days of Exercise per Week: Not on file  . Minutes of Exercise per Session: Not on file  Stress:   . Feeling of Stress : Not on file  Social Connections:   . Frequency of Communication with Friends and Family: Not on file  . Frequency of Social Gatherings with Friends and Family: Not on file  . Attends Religious Services: Not on file  . Active Member of Clubs or Organizations: Not on  file  . Attends Archivist Meetings: Not on file  . Marital Status: Not on file  Intimate Partner Violence:   . Fear of Current or Ex-Partner: Not on file  . Emotionally Abused: Not on file  . Physically Abused: Not on file  . Sexually Abused: Not on file   Family History  Problem Relation Age of Onset  . Stroke Mother   . Pneumonia Father   . Heart disease Father   . Healthy Son   . Glaucoma Daughter   . Emphysema Daughter   . Healthy Daughter   . Other Daughter        had knee replacement  . Healthy Son   . Heart disease Maternal Grandmother       VITAL SIGNS BP (!) 152/75   Pulse 65   Temp 98 F (36.7 C) (Oral)   Resp 18   Ht 5\' 4"  (1.626 m)   Wt 132 lb 12.8 oz (60.2 kg)   SpO2 96%   BMI 22.80 kg/m   Outpatient Encounter Medications as  of 08/17/2020  Medication Sig  . acetaminophen (TYLENOL) 650 MG CR tablet Take 650 mg by mouth every 6 (six) hours.   Marland Kitchen albuterol (PROVENTIL HFA;VENTOLIN HFA) 108 (90 Base) MCG/ACT inhaler Inhale 2 puffs into the lungs at bedtime. For SOB or wheezing  . ANORO ELLIPTA 62.5-25 MCG/INH AEPB Inhale 1 puff into the lungs daily.   Marland Kitchen apixaban (ELIQUIS) 2.5 MG TABS tablet Take 1 tablet (2.5 mg total) by mouth 2 (two) times daily. Resume 07/20/17  . artificial tears (LACRILUBE) OINT ophthalmic ointment Place 1 application into both eyes at bedtime.  Marland Kitchen artificial tears (LACRILUBE) OINT ophthalmic ointment Place 1 application into both eyes daily as needed for dry eyes.  . cycloSPORINE (RESTASIS) 0.05 % ophthalmic emulsion Place 1 drop into both eyes every 12 (twelve) hours.   Marland Kitchen diltiazem (CARDIZEM CD) 180 MG 24 hr capsule TAKE ONE CAPSULE BY MOUTH ONCE DAILY.  . furosemide (LASIX) 20 MG tablet Take 20 mg by mouth 2 (two) times daily.   Marland Kitchen gabapentin (NEURONTIN) 100 MG capsule Take 100 mg by mouth 2 (two) times daily. Bilateral Lower Extremity Neuropathic Pain  . hydrocortisone (ANUSOL-HC) 2.5 % rectal cream Place 1 application rectally 2 (two) times daily as needed. Apply topically for hemorrhoids as directed   . Melatonin 5 MG TABS Take 5 mg by mouth at bedtime as needed.   . Menthol, Topical Analgesic, (BIOFREEZE) 4 % GEL Apply 1 application topically in the morning, at noon, and at bedtime. Apply topically to left shoulder, left knee and neck for M/S pain  . NON FORMULARY Diet Type:  Regular  . Nutritional Supplements (ENSURE ENLIVE PO) Take 237 mLs by mouth daily.   . OXYGEN Inhale 2 L into the lungs at bedtime.   . pantoprazole (PROTONIX) 40 MG tablet Take 40 mg by mouth daily.   Vladimir Faster Glycol-Propyl Glycol (SYSTANE OP) Place 1 drop into both eyes 2 (two) times daily. For dry eyes  Administer 15 minutes after administering Restasis eye drops.  . polyethylene glycol (MIRALAX / GLYCOLAX) packet Take 17  g by mouth daily as needed. For constipation  . traMADol (ULTRAM) 50 MG tablet Take 1 tablet (50 mg total) by mouth every 8 (eight) hours.  . [DISCONTINUED] Albuterol (VENTOLIN IN) Inhale into the lungs.     No facility-administered encounter medications on file as of 08/17/2020.     SIGNIFICANT DIAGNOSTIC EXAMS   PREVIOUS  05-09-20: KUB normal KUB  05-09-20: thoracic spine x-ray:Multilevel degenerative disc disease with dextroscoliosis.  05-09-20: lumbar spine x-ray:  Multilevel degenerative disc disease with no acute fracture.  NO NEW EXAMS.     LABS REVIEWED: PREVIOUS   11-04-19: wbc 5.2; hgb 13.3; hct 42.1; mcv 92.3 plt 202; glucose 92; bun 20; creat 0.70; k+ 3.9; na++ 142; ca 8.8; liver normal albumin 3.5  04-25-20: wbc 5.5; hgb 12.3; hct 40.2 mcv 83.8 plt 219; glucose 96; bun 26; creat 0.94; k+ 4.2; na++ 138; ca 8.8 liver normal albumin 3.3    NO NEW LABS.   Review of Systems  Constitutional: Negative for malaise/fatigue.  Respiratory: Negative for cough and shortness of breath.   Cardiovascular: Negative for chest pain, palpitations and leg swelling.  Gastrointestinal: Negative for abdominal pain, constipation and heartburn.  Musculoskeletal: Negative for back pain, joint pain and myalgias.  Skin: Negative.   Neurological: Negative for dizziness.  Psychiatric/Behavioral: The patient is not nervous/anxious.    .   Physical Exam Constitutional:      General: She is not in acute distress.    Appearance: She is well-developed. She is not diaphoretic.  Neck:     Thyroid: No thyromegaly.  Cardiovascular:     Rate and Rhythm: Normal rate and regular rhythm.     Heart sounds: Normal heart sounds.     Comments: pacemaker Pulmonary:     Effort: Pulmonary effort is normal. No respiratory distress.     Breath sounds: Normal breath sounds.  Abdominal:     General: Bowel sounds are normal. There is no distension.     Palpations: Abdomen is soft.     Tenderness: There is no  abdominal tenderness.  Musculoskeletal:     Cervical back: Neck supple.     Right lower leg: Edema present.     Left lower leg: Edema present.     Comments: Limited range of motion to left shoulder   2+ bilateral lower extremity            Lymphadenopathy:     Cervical: No cervical adenopathy.  Skin:    General: Skin is warm and dry.  Neurological:     Mental Status: She is alert and oriented to person, place, and time.  Psychiatric:        Mood and Affect: Mood normal.     ASSESSMENT/ PLAN:  TODAY;   1. Peripheral neuropathy: is stable will continue gabapentin 100 mg twice daily   2. CVA (cerebrovascular accident) is stable will continue eliquis 2.5 mg twice daily   3. Complete heart block is status post Dt. Jude PTV/DP pace maker will monitor her status    PREVIOUS  4. Dyslipidemia is stable is off lipitor will monitor  5. Chronic constipation: is stable will continue miralax daily as needed  6. chronic diastolic heart failure: EF 60-65% (06-21-17) is stable will continue lasix 20 mg twice daily   7. Longstanding persistent atrial fibrillation is status post pace maker will continue cardizem cd 180 mg daily for rate control eliquis 2.5 mg twice daily   8. Hypertensive heart and kidney disease with chronic diastolic congestive heart failure wiht satge 3 chronic kidney disease is stable b/p 152/75 will continue cardizem cd 180 mg daily is off norvasc. /  9. Weight loss: her weight is stable: 05-25-19: 137 pounds with her current weight of 132 pounds will monitor   10. Chronic obstructive pulmonary disease stable 02 dependent will continue anoro 62.5/25 mcg 1 puff  daily albuterol 2 puffs every hs.  11. Chronic kidney disease stage 3 (moderate) is stable bun 26 creat 0.94 will monitor   12 GERD without esophagitis: is stable will continue protonix 40 mg daily   13. Chronic generalized pain  is stable will continue  tylenol cr 650 mg every 6 hours with ultram 50 mg every 6  hours will monitor her status.       MD is aware of resident's narcotic use and is in agreement with current plan of care. We will attempt to wean resident as appropriate.  Ok Edwards NP [facility]  Contact [phone] Monday through Friday 8am- 5pm  After hours call [phone]

## 2020-09-01 ENCOUNTER — Encounter: Payer: Self-pay | Admitting: Adult Health

## 2020-09-01 ENCOUNTER — Non-Acute Institutional Stay (SKILLED_NURSING_FACILITY): Payer: Medicare Other | Admitting: Adult Health

## 2020-09-01 DIAGNOSIS — I639 Cerebral infarction, unspecified: Secondary | ICD-10-CM

## 2020-09-01 DIAGNOSIS — J449 Chronic obstructive pulmonary disease, unspecified: Secondary | ICD-10-CM | POA: Diagnosis not present

## 2020-09-01 DIAGNOSIS — I442 Atrioventricular block, complete: Secondary | ICD-10-CM | POA: Diagnosis not present

## 2020-09-01 NOTE — Progress Notes (Signed)
Location:    [place] Room Number: 108/W Place of Service:  SNF (31)   CODE STATUS: DNR  Allergies  Allergen Reactions  . Sulfur Swelling and Rash    Chief Complaint  Patient presents with  . Acute Visit    Care Plan Meeting    HPI:  We have come together for her care plan meeting. Family present. BIMS 12/15 mood 6/30. She has had one fall without injury. She requires limited to extensive assist with adls. She is occasionally incontinent of bladder and frequent incontinent bowel. She has has teeth extracted on 08-31-20. Her weight is stable in the 130's. There are no reports of pain present. No reports of agitation or anxiety. Her family would like  She continues to be followed for her chronic illnesses: Cerebrovascular accident (CVA) unspecified mechanism Complete heart block  Chronic obstructive pulmonary disease unspecified COPD type   Past Medical History:  Diagnosis Date  . Atrial fibrillation ([place])   . Bradycardia   . Breast nodule 11/15/2014  . Breast pain, left 11/15/2014  . Carotid artery disease ([place])   . Dizziness   . Dyspnea    Previous CPX suggesting possible restrictive physiology, respiratory muscle fatigue, diastolic dysfunction  . Essential hypertension, benign   . Hyperlipidemia   . Oxygen dependent    2 liter  . Pacemaker [place]   . Rib pain on left side 11/15/2014  . Seasonal allergies   . Shingles 05/04/2015  . Stroke ([place])   . Toe fracture, left    left big toe    Past Surgical History:  Procedure Laterality Date  . COLONOSCOPY  02/22/2012   Procedure: COLONOSCOPY;  Surgeon: Rogene Houston, MD;  [facility];  Service: Endoscopy;  Laterality: N/A;  100  . KNEE SURGERY     bilateral  . PACEMAKER IMPLANT N/A 07/17/2017   Procedure: Pacemaker Implant;  Surgeon: Deboraha Sprang, MD;  Location: [place] [facility];  Service: Cardiovascular;  Laterality: N/A;    Social History   Socioeconomic History  . Marital  status: Widowed    Spouse name: Not on file  . Number of children: Not on file  . Years of education: Not on file  . Highest education level: Not on file  Occupational History  . Not on file  Tobacco Use  . Smoking status: Never Smoker  . Smokeless tobacco: Never Used  Vaping Use  . Vaping Use: Never used  Substance and Sexual Activity  . Alcohol use: No  . Drug use: No  . Sexual activity: Never    Birth control/protection: Post-menopausal  Other Topics Concern  . Not on file  Social History Narrative  . Not on file   Social Determinants of Health   Financial Resource Strain:   . Difficulty of Paying Living Expenses: Not on file  Food Insecurity:   . Worried About Charity fundraiser in the Last Year: Not on file  . Ran Out of Food in the Last Year: Not on file  Transportation Needs:   . Lack of Transportation (Medical): Not on file  . Lack of Transportation (Non-Medical): Not on file  Physical Activity:   . Days of Exercise per Week: Not on file  . Minutes of Exercise per Session: Not on file  Stress:   . Feeling of Stress : Not on file  Social Connections:   . Frequency of Communication with Friends and Family: Not on file  . Frequency  of Social Gatherings with Friends and Family: Not on file  . Attends Religious Services: Not on file  . Active Member of Clubs or Organizations: Not on file  . Attends Archivist Meetings: Not on file  . Marital Status: Not on file  Intimate Partner Violence:   . Fear of Current or Ex-Partner: Not on file  . Emotionally Abused: Not on file  . Physically Abused: Not on file  . Sexually Abused: Not on file   Family History  Problem Relation Age of Onset  . Stroke Mother   . Pneumonia Father   . Heart disease Father   . Healthy Son   . Glaucoma Daughter   . Emphysema Daughter   . Healthy Daughter   . Other Daughter        had knee replacement  . Healthy Son   . Heart disease Maternal Grandmother       VITAL  SIGNS BP 116/62   Pulse 63   Temp 98 F (36.7 C)   Resp 18   Ht 5\' 4"  (1.626 m)   Wt 132 lb 12.8 oz (60.2 kg)   SpO2 96%   BMI 22.80 kg/m   Outpatient Encounter Medications as of 09/01/2020  Medication Sig  . acetaminophen (TYLENOL) 325 MG tablet Take 650 mg by mouth every 6 (six) hours as needed.  Marland Kitchen acetaminophen (TYLENOL) 650 MG CR tablet Take 650 mg by mouth every 6 (six) hours.   Marland Kitchen albuterol (PROVENTIL HFA;VENTOLIN HFA) 108 (90 Base) MCG/ACT inhaler Inhale 2 puffs into the lungs at bedtime. For SOB or wheezing  . ANORO ELLIPTA 62.5-25 MCG/INH AEPB Inhale 1 puff into the lungs daily.   Marland Kitchen apixaban (ELIQUIS) 2.5 MG TABS tablet Take 1 tablet (2.5 mg total) by mouth 2 (two) times daily. Resume 07/20/17  . artificial tears (LACRILUBE) OINT ophthalmic ointment Place 1 application into both eyes at bedtime.  Marland Kitchen artificial tears (LACRILUBE) OINT ophthalmic ointment Place 1 application into both eyes daily as needed for dry eyes.  . cycloSPORINE (RESTASIS) 0.05 % ophthalmic emulsion Place 1 drop into both eyes every 12 (twelve) hours.   Marland Kitchen diltiazem (CARDIZEM CD) 180 MG 24 hr capsule TAKE ONE CAPSULE BY MOUTH ONCE DAILY.  . furosemide (LASIX) 20 MG tablet Take 20 mg by mouth 2 (two) times daily.   Marland Kitchen gabapentin (NEURONTIN) 100 MG capsule Take 100 mg by mouth 2 (two) times daily. Bilateral Lower Extremity Neuropathic Pain  . hydrocortisone (ANUSOL-HC) 2.5 % rectal cream Place 1 application rectally 2 (two) times daily as needed. Apply topically for hemorrhoids as directed   . Melatonin 5 MG TABS Take 5 mg by mouth at bedtime as needed.   . Menthol, Topical Analgesic, (BIOFREEZE) 4 % GEL Apply 1 application topically in the morning, at noon, and at bedtime. Apply topically to left shoulder, left knee and neck for M/S pain  . NON FORMULARY Diet Type:  Regular  . Nutritional Supplements (ENSURE ENLIVE PO) Take 237 mLs by mouth daily.   . OXYGEN Inhale 2 L into the lungs at bedtime.   . pantoprazole  (PROTONIX) 40 MG tablet Take 40 mg by mouth daily.   Vladimir Faster Glycol-Propyl Glycol (SYSTANE OP) Place 1 drop into both eyes 2 (two) times daily. For dry eyes  Administer 15 minutes after administering Restasis eye drops.  . polyethylene glycol (MIRALAX / GLYCOLAX) packet Take 17 g by mouth daily as needed. For constipation  . traMADol (ULTRAM) 50 MG tablet Take 1  tablet (50 mg total) by mouth every 8 (eight) hours.  . [DISCONTINUED] Albuterol (VENTOLIN IN) Inhale into the lungs.     No facility-administered encounter medications on file as of 09/01/2020.     SIGNIFICANT DIAGNOSTIC EXAMS   PREVIOUS  05-09-20: KUB normal KUB  05-09-20: thoracic spine x-ray:Multilevel degenerative disc disease with dextroscoliosis.  05-09-20: lumbar spine x-ray:  Multilevel degenerative disc disease with no acute fracture.  NO NEW EXAMS.     LABS REVIEWED: PREVIOUS   11-04-19: wbc 5.2; hgb 13.3; hct 42.1; mcv 92.3 plt 202; glucose 92; bun 20; creat 0.70; k+ 3.9; na++ 142; ca 8.8; liver normal albumin 3.5  04-25-20: wbc 5.5; hgb 12.3; hct 40.2 mcv 83.8 plt 219; glucose 96; bun 26; creat 0.94; k+ 4.2; na++ 138; ca 8.8 liver normal albumin 3.3    NO NEW LABS.   Review of Systems  Constitutional: Negative for malaise/fatigue.  Respiratory: Negative for cough and shortness of breath.   Cardiovascular: Negative for chest pain, palpitations and leg swelling.  Gastrointestinal: Negative for abdominal pain, constipation and heartburn.  Musculoskeletal: Negative for back pain, joint pain and myalgias.  Skin: Negative.   Neurological: Negative for dizziness.  Psychiatric/Behavioral: The patient is not nervous/anxious.     Physical Exam Constitutional:      General: She is not in acute distress.    Appearance: She is well-developed. She is not diaphoretic.  Neck:     Thyroid: No thyromegaly.  Cardiovascular:     Rate and Rhythm: Normal rate and regular rhythm.     Pulses: Normal pulses.     Heart  sounds: Normal heart sounds.     Comments: Pace maker  Pulmonary:     Effort: Pulmonary effort is normal. No respiratory distress.     Breath sounds: Normal breath sounds.  Abdominal:     General: Bowel sounds are normal. There is no distension.     Palpations: Abdomen is soft.     Tenderness: There is no abdominal tenderness.  Musculoskeletal:     Cervical back: Neck supple.     Right lower leg: No edema.     Left lower leg: No edema.     Comments: Limited range of motion to left shoulder   2+ bilateral lower extremity             Lymphadenopathy:     Cervical: No cervical adenopathy.  Skin:    General: Skin is warm and dry.  Neurological:     Mental Status: She is alert and oriented to person, place, and time.  Psychiatric:        Mood and Affect: Mood normal.      ASSESSMENT/ PLAN:  TODAY  1. Cerebrovascular accident (CVA) unspecified mechanism 2. Complete heart block 3. Chronic obstructive pulmonary disease unspecified COPD type  Will continue current medications Will continue plan of care Will continue to monitor her status.   MD is aware of resident's narcotic use and is in agreement with current plan of care. We will attempt to wean resident as appropriate.  Ok Edwards NP [facility]  Contact [phone] Monday through Friday 8am- 5pm  After hours call [phone]

## 2020-09-06 ENCOUNTER — Other Ambulatory Visit: Payer: Self-pay | Admitting: Adult Health

## 2020-09-06 MED ORDER — TRAMADOL HCL 50 MG PO TABS: 50.0000 mg | ORAL_TABLET | Freq: Three times a day (TID) | ORAL | 0 refills | Status: DC

## 2020-09-14 ENCOUNTER — Encounter: Payer: Self-pay | Admitting: Internal Medicine

## 2020-09-14 ENCOUNTER — Other Ambulatory Visit (HOSPITAL_COMMUNITY)
Admission: AD | Admit: 2020-09-14 | Discharge: 2020-09-14 | Disposition: A | Discharge status: Home / Self Care | Payer: Medicare Other | Source: Skilled Nursing Facility | Attending: Internal Medicine | Admitting: Internal Medicine

## 2020-09-14 ENCOUNTER — Non-Acute Institutional Stay (SKILLED_NURSING_FACILITY): Payer: Medicare Other | Admitting: Internal Medicine

## 2020-09-14 DIAGNOSIS — K625 Hemorrhage of anus and rectum: Secondary | ICD-10-CM | POA: Insufficient documentation

## 2020-09-14 DIAGNOSIS — G588 Other specified mononeuropathies: Secondary | ICD-10-CM | POA: Diagnosis not present

## 2020-09-14 DIAGNOSIS — G2581 Restless legs syndrome: Secondary | ICD-10-CM | POA: Diagnosis not present

## 2020-09-14 DIAGNOSIS — N183 Chronic kidney disease, stage 3 unspecified: Secondary | ICD-10-CM | POA: Diagnosis present

## 2020-09-14 DIAGNOSIS — I10 Essential (primary) hypertension: Secondary | ICD-10-CM

## 2020-09-14 DIAGNOSIS — N1831 Chronic kidney disease, stage 3a: Secondary | ICD-10-CM

## 2020-09-14 DIAGNOSIS — K219 Gastro-esophageal reflux disease without esophagitis: Secondary | ICD-10-CM

## 2020-09-14 LAB — CBC WITH DIFFERENTIAL/PLATELET
Abs Immature Granulocytes: 0.02 10*3/uL (ref 0.00–0.07)
Basophils Absolute: 0 10*3/uL (ref 0.0–0.1)
Basophils Relative: 1 %
Eosinophils Absolute: 0.1 10*3/uL (ref 0.0–0.5)
Eosinophils Relative: 2 %
HCT: 42.3 % (ref 36.0–46.0)
Hemoglobin: 13.1 g/dL (ref 12.0–15.0)
Immature Granulocytes: 0 %
Lymphocytes Relative: 23 %
Lymphs Abs: 1.3 10*3/uL (ref 0.7–4.0)
MCH: 26.3 pg (ref 26.0–34.0)
MCHC: 31 g/dL (ref 30.0–36.0)
MCV: 84.9 fL (ref 80.0–100.0)
Monocytes Absolute: 0.8 10*3/uL (ref 0.1–1.0)
Monocytes Relative: 14 %
Neutro Abs: 3.5 10*3/uL (ref 1.7–7.7)
Neutrophils Relative %: 60 %
Platelets: 201 10*3/uL (ref 150–400)
RBC: 4.98 MIL/uL (ref 3.87–5.11)
RDW: 14.7 % (ref 11.5–15.5)
WBC: 5.7 10*3/uL (ref 4.0–10.5)
nRBC: 0 % (ref 0.0–0.2)
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
## 2020-09-14 NOTE — Assessment & Plan Note (Signed)
NP will monitor & consider trial of Requip

## 2020-09-14 NOTE — Assessment & Plan Note (Signed)
04/25/2020 creatinine 0.94 and GFR 50 compatible with CKD stage IIIa. The normal creatinine is in the context of probable malnutrition with albumin 3.3 and total protein of 6.

## 2020-09-14 NOTE — Patient Instructions (Signed)
See assessment and plan under each diagnosis in the problem list and acutely for this visit 

## 2020-09-14 NOTE — Assessment & Plan Note (Addendum)
BP controlled; no change in antihypertensive medications. She is on a rate limiting calcium channel blocker.  Nonselective beta-blocker would be contraindicated in the context of her COPD.

## 2020-09-14 NOTE — Assessment & Plan Note (Signed)
Her neuropathy is felt to be stable on present dose of Neurontin.

## 2020-09-14 NOTE — Assessment & Plan Note (Signed)
09/14/2020 she describes intermittent dysphagia and dyspepsia as well as black stool.  She is not on iron.  She is on a novel anticoagulant.  The last CBC was in May with hemoglobin 12.3 and hematocrit 40.2.  CBC will be updated.

## 2020-09-28 ENCOUNTER — Other Ambulatory Visit: Payer: Self-pay | Admitting: Adult Health

## 2020-09-28 MED ORDER — TRAMADOL HCL 50 MG PO TABS: 50.0000 mg | ORAL_TABLET | Freq: Three times a day (TID) | ORAL | 0 refills | Status: DC

## 2020-10-07 ENCOUNTER — Encounter: Payer: Self-pay | Admitting: Adult Health

## 2020-10-07 ENCOUNTER — Non-Acute Institutional Stay (SKILLED_NURSING_FACILITY): Payer: Medicare Other | Admitting: Adult Health

## 2020-10-07 DIAGNOSIS — K219 Gastro-esophageal reflux disease without esophagitis: Secondary | ICD-10-CM

## 2020-10-07 DIAGNOSIS — Z66 Do not resuscitate: Secondary | ICD-10-CM | POA: Diagnosis not present

## 2020-10-07 NOTE — Progress Notes (Signed)
Location:    [place] Room Number: 108-W Place of Service:  SNF (31)    CODE STATUS: DNR  Allergies  Allergen Reactions  . Sulfur Swelling and Rash    Chief Complaint  Patient presents with  . Acute Visit    Patient c/o GERD     HPI:  She is having worsening GERD symptoms. She is coughing more with mucus production; which is clear. Does have heart burn. Denies any excessive belching.   Past Medical History:  Diagnosis Date  . Atrial fibrillation ([place])   . Bradycardia   . Breast nodule 11/15/2014  . Breast pain, left 11/15/2014  . Carotid artery disease ([place])   . Dizziness   . Dyspnea    Previous CPX suggesting possible restrictive physiology, respiratory muscle fatigue, diastolic dysfunction  . Essential hypertension, benign   . Hyperlipidemia   . Oxygen dependent    2 liter  . Pacemaker [place]   . Rib pain on left side 11/15/2014  . Seasonal allergies   . Shingles 05/04/2015  . Stroke ([place])   . Toe fracture, left    left big toe    Past Surgical History:  Procedure Laterality Date  . COLONOSCOPY  02/22/2012   Procedure: COLONOSCOPY;  Surgeon: Rogene Houston, MD;  [facility];  Service: Endoscopy;  Laterality: N/A;  100  . KNEE SURGERY     bilateral  . PACEMAKER IMPLANT N/A 07/17/2017   Procedure: Pacemaker Implant;  Surgeon: Deboraha Sprang, MD;  Location: [facility] CV LAB;  Service: Cardiovascular;  Laterality: N/A;    Social History   Socioeconomic History  . Marital status: Widowed    Spouse name: Not on file  . Number of children: Not on file  . Years of education: Not on file  . Highest education level: Not on file  Occupational History  . Not on file  Tobacco Use  . Smoking status: Never Smoker  . Smokeless tobacco: Never Used  Vaping Use  . Vaping Use: Never used  Substance and Sexual Activity  . Alcohol use: No  . Drug use: No  . Sexual activity: Never    Birth control/protection: Post-menopausal    Other Topics Concern  . Not on file  Social History Narrative  . Not on file   Social Determinants of Health   Financial Resource Strain:   . Difficulty of Paying Living Expenses: Not on file  Food Insecurity:   . Worried About Charity fundraiser in the Last Year: Not on file  . Ran Out of Food in the Last Year: Not on file  Transportation Needs:   . Lack of Transportation (Medical): Not on file  . Lack of Transportation (Non-Medical): Not on file  Physical Activity:   . Days of Exercise per Week: Not on file  . Minutes of Exercise per Session: Not on file  Stress:   . Feeling of Stress : Not on file  Social Connections:   . Frequency of Communication with Friends and Family: Not on file  . Frequency of Social Gatherings with Friends and Family: Not on file  . Attends Religious Services: Not on file  . Active Member of Clubs or Organizations: Not on file  . Attends Archivist Meetings: Not on file  . Marital Status: Not on file  Intimate Partner Violence:   . Fear of Current or Ex-Partner: Not on file  . Emotionally Abused: Not on file  .  Physically Abused: Not on file  . Sexually Abused: Not on file   Family History  Problem Relation Age of Onset  . Stroke Mother   . Pneumonia Father   . Heart disease Father   . Healthy Son   . Glaucoma Daughter   . Emphysema Daughter   . Healthy Daughter   . Other Daughter        had knee replacement  . Healthy Son   . Heart disease Maternal Grandmother       VITAL SIGNS BP (!) 180/54   Pulse 60   Temp 97.6 F (36.4 C)   Ht 5\' 4"  (1.626 m)   Wt 133 lb (60.3 kg)   SpO2 92%   BMI 22.83 kg/m   Outpatient Encounter Medications as of 10/07/2020  Medication Sig  . acetaminophen (TYLENOL) 650 MG CR tablet Take 650 mg by mouth every 6 (six) hours.   Marland Kitchen albuterol (PROVENTIL HFA;VENTOLIN HFA) 108 (90 Base) MCG/ACT inhaler Inhale 2 puffs into the lungs at bedtime. For SOB or wheezing  . ANORO ELLIPTA 62.5-25 MCG/INH  AEPB Inhale 1 puff into the lungs daily.   Marland Kitchen apixaban (ELIQUIS) 2.5 MG TABS tablet Take 1 tablet (2.5 mg total) by mouth 2 (two) times daily. Resume 07/20/17  . artificial tears (LACRILUBE) OINT ophthalmic ointment Place 1 application into both eyes at bedtime.  Marland Kitchen artificial tears (LACRILUBE) OINT ophthalmic ointment Place 1 application into both eyes daily as needed for dry eyes.  . cycloSPORINE (RESTASIS) 0.05 % ophthalmic emulsion Place 1 drop into both eyes every 12 (twelve) hours.   Marland Kitchen diltiazem (CARDIZEM CD) 180 MG 24 hr capsule TAKE ONE CAPSULE BY MOUTH ONCE DAILY.  . furosemide (LASIX) 20 MG tablet Take 20 mg by mouth 2 (two) times daily.   Marland Kitchen gabapentin (NEURONTIN) 100 MG capsule Take 100 mg by mouth 2 (two) times daily. Bilateral Lower Extremity Neuropathic Pain  . hydrocortisone (ANUSOL-HC) 2.5 % rectal cream Place 1 application rectally 2 (two) times daily as needed. Apply topically for hemorrhoids as directed   . Melatonin 5 MG TABS Take 5 mg by mouth at bedtime as needed.   . Menthol, Topical Analgesic, (BIOFREEZE) 4 % GEL Apply 1 application topically in the morning, at noon, and at bedtime. Apply topically to left shoulder, left knee and neck for M/S pain  . NON FORMULARY Diet Type:  Regular  . Nutritional Supplements (ENSURE ENLIVE PO) Take 237 mLs by mouth daily.   . OXYGEN Inhale 2 L into the lungs at bedtime.   . pantoprazole (PROTONIX) 40 MG tablet Take 40 mg by mouth daily.   Vladimir Faster Glycol-Propyl Glycol (SYSTANE OP) Place 1 drop into both eyes 2 (two) times daily. For dry eyes  Administer 15 minutes after administering Restasis eye drops.  . polyethylene glycol (MIRALAX / GLYCOLAX) packet Take 17 g by mouth daily as needed. For constipation  . traMADol (ULTRAM) 50 MG tablet Take 1 tablet (50 mg total) by mouth every 8 (eight) hours.  . [DISCONTINUED] acetaminophen (TYLENOL) 325 MG tablet Take 650 mg by mouth every 6 (six) hours as needed. (Patient not taking: Reported on  09/14/2020)  . [DISCONTINUED] Albuterol (VENTOLIN IN) Inhale into the lungs.     No facility-administered encounter medications on file as of 10/07/2020.     SIGNIFICANT DIAGNOSTIC EXAMS  PREVIOUS  05-09-20: KUB normal KUB  05-09-20: thoracic spine x-ray:Multilevel degenerative disc disease with dextroscoliosis.  05-09-20: lumbar spine x-ray:  Multilevel degenerative disc disease with  no acute fracture.  NO NEW EXAMS.     LABS REVIEWED: PREVIOUS   11-04-19: wbc 5.2; hgb 13.3; hct 42.1; mcv 92.3 plt 202; glucose 92; bun 20; creat 0.70; k+ 3.9; na++ 142; ca 8.8; liver normal albumin 3.5  04-25-20: wbc 5.5; hgb 12.3; hct 40.2 mcv 83.8 plt 219; glucose 96; bun 26; creat 0.94; k+ 4.2; na++ 138; ca 8.8 liver normal albumin 3.3    NO NEW LABS.  Review of Systems  Constitutional: Negative for malaise/fatigue.  Respiratory: Positive for cough. Negative for shortness of breath.   Cardiovascular: Negative for chest pain, palpitations and leg swelling.  Gastrointestinal: Positive for heartburn. Negative for abdominal pain, constipation and nausea.  Musculoskeletal: Negative for back pain, joint pain and myalgias.  Skin: Negative.   Neurological: Negative for dizziness.  Psychiatric/Behavioral: The patient is not nervous/anxious.     Physical Exam Constitutional:      General: She is not in acute distress.    Appearance: She is well-developed. She is not diaphoretic.  Neck:     Thyroid: No thyromegaly.  Cardiovascular:     Rate and Rhythm: Normal rate and regular rhythm.     Heart sounds: Normal heart sounds.     Comments: Pacemaker  Pulmonary:     Effort: Pulmonary effort is normal. No respiratory distress.     Breath sounds: Normal breath sounds.  Abdominal:     General: Bowel sounds are normal. There is no distension.     Palpations: Abdomen is soft.     Tenderness: There is no abdominal tenderness.  Musculoskeletal:     Cervical back: Neck supple.     Right lower leg: Edema  present.     Left lower leg: Edema present.     Comments:  Limited range of motion to left shoulder   2+ bilateral lower extremity  Lymphadenopathy:     Cervical: No cervical adenopathy.  Skin:    General: Skin is warm and dry.  Neurological:     Mental Status: She is alert and oriented to person, place, and time.  Psychiatric:        Mood and Affect: Mood normal.       ASSESSMENT/ PLAN:  TODAY  1. GERD without esophagitis: is worse; will increase protonix to twice daily and will monitor her status.   MD is aware of resident's narcotic use and is in agreement with current plan of care. We will attempt to wean resident as appropriate.  Ok Edwards NP [facility]  Contact [phone] Monday through Friday 8am- 5pm  After hours call [phone]

## 2020-10-18 ENCOUNTER — Encounter: Payer: Self-pay | Admitting: Adult Health

## 2020-10-18 ENCOUNTER — Non-Acute Institutional Stay (SKILLED_NURSING_FACILITY): Payer: Medicare Other | Admitting: Adult Health

## 2020-10-18 ENCOUNTER — Ambulatory Visit: Payer: Medicare Other

## 2020-10-18 DIAGNOSIS — K5909 Other constipation: Secondary | ICD-10-CM | POA: Diagnosis not present

## 2020-10-18 DIAGNOSIS — E785 Hyperlipidemia, unspecified: Secondary | ICD-10-CM | POA: Diagnosis not present

## 2020-10-18 DIAGNOSIS — K219 Gastro-esophageal reflux disease without esophagitis: Secondary | ICD-10-CM

## 2020-10-18 NOTE — Progress Notes (Signed)
Location:    [place] Room Number: 108-W Place of Service:  SNF (31)   CODE STATUS: DNR  Allergies  Allergen Reactions  . Sulfur Swelling and Rash    Chief Complaint  Patient presents with  . Medical Management of Chronic Issues           GERD without esophagitis:   Dyslipidemia: Chronic constipation:    HPI:  She is a 86 year old long term resident of this facility being seen for the management of her chronic illnesses:GERD without esophagitis:   Dyslipidemia: Chronic constipation. There are no reports of uncontrolled pain; no reports of heart burn; no reports of constipation.    Past Medical History:  Diagnosis Date  . Atrial fibrillation ([place])   . Bradycardia   . Breast nodule 11/15/2014  . Breast pain, left 11/15/2014  . Carotid artery disease ([place])   . Dizziness   . Dyspnea    Previous CPX suggesting possible restrictive physiology, respiratory muscle fatigue, diastolic dysfunction  . Essential hypertension, benign   . Hyperlipidemia   . Oxygen dependent    2 liter  . Pacemaker [place]   . Rib pain on left side 11/15/2014  . Seasonal allergies   . Shingles 05/04/2015  . Stroke ([place])   . Toe fracture, left    left big toe    Past Surgical History:  Procedure Laterality Date  . COLONOSCOPY  02/22/2012   Procedure: COLONOSCOPY;  Surgeon: Rogene Houston, MD;  [facility];  Service: Endoscopy;  Laterality: N/A;  100  . KNEE SURGERY     bilateral  . PACEMAKER IMPLANT N/A 07/17/2017   Procedure: Pacemaker Implant;  Surgeon: Deboraha Sprang, MD;  Location: [place] [facility];  Service: Cardiovascular;  Laterality: N/A;    Social History   Socioeconomic History  . Marital status: Widowed    Spouse name: Not on file  . Number of children: Not on file  . Years of education: Not on file  . Highest education level: Not on file  Occupational History  . Not on file  Tobacco Use  . Smoking status: Never Smoker  . Smokeless  tobacco: Never Used  Vaping Use  . Vaping Use: Never used  Substance and Sexual Activity  . Alcohol use: No  . Drug use: No  . Sexual activity: Never    Birth control/protection: Post-menopausal  Other Topics Concern  . Not on file  Social History Narrative  . Not on file   Social Determinants of Health   Financial Resource Strain:   . Difficulty of Paying Living Expenses: Not on file  Food Insecurity:   . Worried About Charity fundraiser in the Last Year: Not on file  . Ran Out of Food in the Last Year: Not on file  Transportation Needs:   . Lack of Transportation (Medical): Not on file  . Lack of Transportation (Non-Medical): Not on file  Physical Activity:   . Days of Exercise per Week: Not on file  . Minutes of Exercise per Session: Not on file  Stress:   . Feeling of Stress : Not on file  Social Connections:   . Frequency of Communication with Friends and Family: Not on file  . Frequency of Social Gatherings with Friends and Family: Not on file  . Attends Religious Services: Not on file  . Active Member of Clubs or Organizations: Not on file  . Attends Archivist Meetings: Not  on file  . Marital Status: Not on file  Intimate Partner Violence:   . Fear of Current or Ex-Partner: Not on file  . Emotionally Abused: Not on file  . Physically Abused: Not on file  . Sexually Abused: Not on file   Family History  Problem Relation Age of Onset  . Stroke Mother   . Pneumonia Father   . Heart disease Father   . Healthy Son   . Glaucoma Daughter   . Emphysema Daughter   . Healthy Daughter   . Other Daughter        had knee replacement  . Healthy Son   . Heart disease Maternal Grandmother       VITAL SIGNS BP 136/87   Pulse 84   Temp 98 F (36.7 C)   Resp 18   Ht 5\' 4"  (1.626 m)   Wt 133 lb (60.3 kg)   SpO2 95%   BMI 22.83 kg/m   Outpatient Encounter Medications as of 10/18/2020  Medication Sig  . acetaminophen (TYLENOL) 650 MG CR tablet Take  650 mg by mouth every 6 (six) hours.   Marland Kitchen albuterol (PROVENTIL HFA;VENTOLIN HFA) 108 (90 Base) MCG/ACT inhaler Inhale 2 puffs into the lungs at bedtime. For SOB or wheezing  . ANORO ELLIPTA 62.5-25 MCG/INH AEPB Inhale 1 puff into the lungs daily.   Marland Kitchen apixaban (ELIQUIS) 2.5 MG TABS tablet Take 1 tablet (2.5 mg total) by mouth 2 (two) times daily. Resume 07/20/17  . artificial tears (LACRILUBE) OINT ophthalmic ointment Place 1 application into both eyes at bedtime.  Marland Kitchen artificial tears (LACRILUBE) OINT ophthalmic ointment Place 1 application into both eyes daily as needed for dry eyes.  . cycloSPORINE (RESTASIS) 0.05 % ophthalmic emulsion Place 1 drop into both eyes every 12 (twelve) hours.   Marland Kitchen diltiazem (CARDIZEM CD) 180 MG 24 hr capsule TAKE ONE CAPSULE BY MOUTH ONCE DAILY.  . furosemide (LASIX) 20 MG tablet Take 20 mg by mouth 2 (two) times daily.   Marland Kitchen gabapentin (NEURONTIN) 100 MG capsule Take 100 mg by mouth 2 (two) times daily. Bilateral Lower Extremity Neuropathic Pain  . hydrocortisone (ANUSOL-HC) 2.5 % rectal cream Place 1 application rectally 2 (two) times daily as needed. Apply topically for hemorrhoids as directed   . Melatonin 5 MG TABS Take 5 mg by mouth at bedtime as needed.   . Menthol, Topical Analgesic, (BIOFREEZE) 4 % GEL Apply 1 application topically in the morning, at noon, and at bedtime. Apply topically to left shoulder, left knee and neck for M/S pain  . NON FORMULARY Diet Type:  Regular  . Nutritional Supplements (ENSURE ENLIVE PO) Take 237 mLs by mouth daily.   . OXYGEN Inhale 2 L into the lungs at bedtime.   . pantoprazole (PROTONIX) 40 MG tablet Take 40 mg by mouth daily.   Vladimir Faster Glycol-Propyl Glycol (SYSTANE OP) Place 1 drop into both eyes 2 (two) times daily. For dry eyes  Administer 15 minutes after administering Restasis eye drops.  . polyethylene glycol (MIRALAX / GLYCOLAX) packet Take 17 g by mouth daily as needed. For constipation  . traMADol (ULTRAM) 50 MG  tablet Take 1 tablet (50 mg total) by mouth every 8 (eight) hours.  . [DISCONTINUED] Albuterol (VENTOLIN IN) Inhale into the lungs.     No facility-administered encounter medications on file as of 10/18/2020.     SIGNIFICANT DIAGNOSTIC EXAMS   PREVIOUS  05-09-20: KUB normal KUB  05-09-20: thoracic spine x-ray:Multilevel degenerative disc disease with  dextroscoliosis.  05-09-20: lumbar spine x-ray:  Multilevel degenerative disc disease with no acute fracture.  NO NEW EXAMS.     LABS REVIEWED: PREVIOUS   11-04-19: wbc 5.2; hgb 13.3; hct 42.1; mcv 92.3 plt 202; glucose 92; bun 20; creat 0.70; k+ 3.9; na++ 142; ca 8.8; liver normal albumin 3.5  04-25-20: wbc 5.5; hgb 12.3; hct 40.2 mcv 83.8 plt 219; glucose 96; bun 26; creat 0.94; k+ 4.2; na++ 138; ca 8.8 liver normal albumin 3.3    TODAY  09-14-20: wbc 5.7; hgb 13.1; hct 42.3; mcv 84.9 plt 201    Review of Systems  Constitutional: Negative for malaise/fatigue.  Respiratory: Negative for cough and shortness of breath.   Cardiovascular: Negative for chest pain, palpitations and leg swelling.  Gastrointestinal: Negative for abdominal pain, constipation and heartburn.  Musculoskeletal: Negative for back pain, joint pain and myalgias.  Skin: Negative.   Neurological: Negative for dizziness.  Psychiatric/Behavioral: The patient is not nervous/anxious.     Physical Exam Constitutional:      General: She is not in acute distress.    Appearance: She is well-developed. She is not diaphoretic.  Neck:     Thyroid: No thyromegaly.  Cardiovascular:     Rate and Rhythm: Normal rate and regular rhythm.     Pulses: Normal pulses.     Heart sounds: Normal heart sounds.     Comments: Pacemaker  Pulmonary:     Effort: Pulmonary effort is normal. No respiratory distress.     Breath sounds: Normal breath sounds.  Abdominal:     General: Bowel sounds are normal. There is no distension.     Palpations: Abdomen is soft.     Tenderness: There  is no abdominal tenderness.  Musculoskeletal:     Cervical back: Neck supple.     Right lower leg: Edema present.     Left lower leg: Edema present.     Comments: Limited range of motion to left shoulder   2+ bilateral lower extremity   Lymphadenopathy:     Cervical: No cervical adenopathy.  Skin:    General: Skin is warm and dry.  Neurological:     Mental Status: She is alert and oriented to person, place, and time.  Psychiatric:        Mood and Affect: Mood normal.        ASSESSMENT/ PLAN:  TODAY;   1. GERD without esophagitis: is stable will continue protonix 40 mg daily   2. Dyslipidemia: is stable is off lipitor will monitor  3. Chronic constipation: is stable will continue miralax daily as needed.    PREVIOUS  4. chronic diastolic heart failure: EF 60-65% (06-21-17) is stable will continue lasix 20 mg twice daily   5. Longstanding persistent atrial fibrillation is status post pace maker will continue cardizem cd 180 mg daily for rate control eliquis 2.5 mg twice daily   6. Hypertensive heart and kidney disease with chronic diastolic congestive heart failure wiht satge 3 chronic kidney disease is stable b/p 136/87 will continue cardizem cd 180 mg daily is off norvasc. /  7. Weight loss: her weight is stable: 05-25-19: 137 pounds with her current weight of 133 pounds will monitor   8. Chronic obstructive pulmonary disease stable 02 dependent will continue anoro 62.5/25 mcg 1 puff daily albuterol 2 puffs every hs.  9. Chronic kidney disease stage 3 (moderate) is stable bun 26 creat 0.94 will monitor   10 GERD without esophagitis: is stable will continue protonix 40  mg daily   11. Chronic generalized pain  is stable will continue  tylenol cr 650 mg every 6 hours with ultram 50 mg every 6 hours will monitor her status.   12. Peripheral neuropathy: is stable will continue gabapentin 100 mg twice daily   13. CVA (cerebrovascular accident) is stable will continue eliquis  2.5 mg twice daily   14. Complete heart block is status post Dt. Jude PTV/DP pace maker will monitor her status   Will check cmp      MD is aware of resident's narcotic use and is in agreement with current plan of care. We will attempt to wean resident as appropriate.  Ok Edwards NP [facility]  Contact [phone] Monday through Friday 8am- 5pm  After hours call [phone]

## 2020-10-20 ENCOUNTER — Other Ambulatory Visit (HOSPITAL_COMMUNITY)
Admission: RE | Admit: 2020-10-20 | Discharge: 2020-10-20 | Disposition: A | Discharge status: Home / Self Care | Payer: Medicare Other | Source: Skilled Nursing Facility | Attending: Adult Health | Admitting: Adult Health

## 2020-10-20 DIAGNOSIS — I130 Hypertensive heart and chronic kidney disease with heart failure and stage 1 through stage 4 chronic kidney disease, or unspecified chronic kidney disease: Secondary | ICD-10-CM | POA: Insufficient documentation

## 2020-10-20 LAB — COMPREHENSIVE METABOLIC PANEL
ALT: 15 U/L (ref 0–44)
AST: 20 U/L (ref 15–41)
Albumin: 3.6 g/dL (ref 3.5–5.0)
Alkaline Phosphatase: 92 U/L (ref 38–126)
Anion gap: 13 (ref 5–15)
BUN: 22 mg/dL (ref 8–23)
CO2: 26 mmol/L (ref 22–32)
Calcium: 8.7 mg/dL — ABNORMAL LOW (ref 8.9–10.3)
Chloride: 99 mmol/L (ref 98–111)
Creatinine, Ser: 0.9 mg/dL (ref 0.44–1.00)
GFR, Estimated: 57 mL/min — ABNORMAL LOW (ref >60)
Glucose, Bld: 68 mg/dL — ABNORMAL LOW (ref 70–99)
Potassium: 3.9 mmol/L (ref 3.5–5.1)
Sodium: 138 mmol/L (ref 135–145)
Total Bilirubin: 0.8 mg/dL (ref 0.3–1.2)
Total Protein: 6.4 g/dL — ABNORMAL LOW (ref 6.5–8.1)

## 2020-10-25 ENCOUNTER — Other Ambulatory Visit: Payer: Self-pay | Admitting: Adult Health

## 2020-10-25 MED ORDER — TRAMADOL HCL 50 MG PO TABS: 50.0000 mg | ORAL_TABLET | Freq: Three times a day (TID) | ORAL | 0 refills | Status: DC

## 2020-11-15 ENCOUNTER — Non-Acute Institutional Stay (SKILLED_NURSING_FACILITY): Payer: Medicare Other | Admitting: Adult Health

## 2020-11-15 ENCOUNTER — Encounter: Payer: Self-pay | Admitting: Adult Health

## 2020-11-15 DIAGNOSIS — I5032 Chronic diastolic (congestive) heart failure: Secondary | ICD-10-CM

## 2020-11-15 DIAGNOSIS — I130 Hypertensive heart and chronic kidney disease with heart failure and stage 1 through stage 4 chronic kidney disease, or unspecified chronic kidney disease: Secondary | ICD-10-CM

## 2020-11-15 DIAGNOSIS — N1832 Chronic kidney disease, stage 3b: Secondary | ICD-10-CM | POA: Diagnosis not present

## 2020-11-15 DIAGNOSIS — I4811 Longstanding persistent atrial fibrillation: Secondary | ICD-10-CM | POA: Diagnosis not present

## 2020-11-15 NOTE — Progress Notes (Signed)
Location:    [place] Room Number: 108-W Place of Service:  SNF (31)   CODE STATUS: DNR  Allergies  Allergen Reactions   Sulfur Swelling and Rash    Chief Complaint  Patient presents with   Medical Management of Chronic Issues          Chronic diastolic heart failure   Longstanding persistent atrial fibrillation:   Hypertensive heart and kidney disease with chronic diastolic congestive heart failure with stage 3 chronic kidney disease     HPI:  She is a 86 year old long term resident of this facility being seen for the management of her chronic illnesses:  Chronic diastolic heart failure   Longstanding persistent atrial fibrillation:   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 There are no reports of uncontrolled pain. No reports of changes in appetite. No reports agitation or anxiety.   Past Medical History:  Diagnosis Date   Atrial fibrillation ([facility])    Bradycardia    Breast nodule 11/15/2014   Breast pain, left 11/15/2014   Carotid artery disease ([facility])    Dizziness    Dyspnea    Previous CPX suggesting possible restrictive physiology, respiratory muscle fatigue, diastolic dysfunction   Essential hypertension, benign    Hyperlipidemia    Oxygen dependent    2 liter   Pacemaker [facility]    Rib pain on left side 11/15/2014   Seasonal allergies    Shingles 05/04/2015   Stroke ([place])    Toe fracture, left    left big toe    Past Surgical History:  Procedure Laterality Date   COLONOSCOPY  02/22/2012   Procedure: COLONOSCOPY;  Surgeon: Rogene Houston, MD;  [facility];  Service: Endoscopy;  Laterality: N/A;  100   KNEE SURGERY     bilateral   PACEMAKER IMPLANT N/A 07/17/2017   Procedure: Pacemaker Implant;  Surgeon: Deboraha Sprang, MD;  Location: [place] [facility];  Service: Cardiovascular;  Laterality: N/A;    Social History    Socioeconomic History   Marital status: Widowed    Spouse name: Not on file   Number of children: Not on file   Years of education: Not on file   Highest education level: Not on file  Occupational History   Not on file  Tobacco Use   Smoking status: Never Smoker   Smokeless tobacco: Never Used  Vaping Use   Vaping Use: Never used  Substance and Sexual Activity   Alcohol use: No   Drug use: No   Sexual activity: Never    Birth control/protection: Post-menopausal  Other Topics Concern   Not on file  Social History Narrative   Not on file   Social Determinants of Health   Financial Resource Strain:    Difficulty of Paying Living Expenses: Not on file  Food Insecurity:    Worried About [place] in the Last Year: Not on file   Ran Out of Food in the Last Year: Not on file  Transportation Needs:    Lack of Transportation (Medical): Not on file   Lack of Transportation (Non-Medical): Not on file  Physical Activity:    Days of Exercise per Week: Not on file   Minutes of Exercise per Session: Not on file  Stress:    Feeling of Stress : Not on file  Social Connections:    Frequency of Communication with Friends  and Family: Not on file   Frequency of Social Gatherings with Friends and Family: Not on file   Attends Religious Services: Not on file   Active Member of Clubs or Organizations: Not on file   Attends Archivist Meetings: Not on file   Marital Status: Not on file  Intimate Partner Violence:    Fear of Current or Ex-Partner: Not on file   Emotionally Abused: Not on file   Physically Abused: Not on file   Sexually Abused: Not on file   Family History  Problem Relation Age of Onset   Stroke Mother    Pneumonia Father    Heart disease Father    Healthy Son    Glaucoma Daughter    Emphysema Daughter    Healthy Daughter    Other Daughter        had knee replacement   Healthy Son    Heart disease  Maternal Grandmother       VITAL SIGNS BP (!) 158/70    Pulse 63    Temp (!) 97.1 F (36.2 C)    Resp 20    Ht 5\' 4"  (1.626 m)    Wt 131 lb 6.4 oz (59.6 kg)    SpO2 97%    BMI 22.55 kg/m   Outpatient Encounter Medications as of 11/15/2020  Medication Sig   acetaminophen (TYLENOL) 500 MG tablet Take 1,000 mg by mouth every 8 (eight) hours as needed.   acetaminophen (TYLENOL) 650 MG CR tablet Take 650 mg by mouth every 8 (eight) hours.    albuterol (PROVENTIL HFA;VENTOLIN HFA) 108 (90 Base) MCG/ACT inhaler Inhale 2 puffs into the lungs at bedtime. For SOB or wheezing   ANORO ELLIPTA 62.5-25 MCG/INH AEPB Inhale 1 puff into the lungs daily.    apixaban (ELIQUIS) 2.5 MG TABS tablet Take 1 tablet (2.5 mg total) by mouth 2 (two) times daily. Resume 07/20/17   artificial tears (LACRILUBE) OINT ophthalmic ointment Place 1 application into both eyes at bedtime.   artificial tears (LACRILUBE) OINT ophthalmic ointment Place 1 application into both eyes daily as needed for dry eyes.   cycloSPORINE (RESTASIS) 0.05 % ophthalmic emulsion Place 1 drop into both eyes every 12 (twelve) hours.    diltiazem (CARDIZEM CD) 180 MG 24 hr capsule TAKE ONE CAPSULE BY MOUTH ONCE DAILY.   furosemide (LASIX) 20 MG tablet Take 20 mg by mouth 2 (two) times daily.    gabapentin (NEURONTIN) 100 MG capsule Take 100 mg by mouth 2 (two) times daily. Bilateral Lower Extremity Neuropathic Pain   hydrocortisone (ANUSOL-HC) 2.5 % rectal cream Place 1 application rectally 2 (two) times daily as needed. Apply topically for hemorrhoids as directed    Melatonin 5 MG TABS Take 5 mg by mouth at bedtime as needed.    Menthol, Topical Analgesic, (BIOFREEZE) 4 % GEL Apply 1 application topically in the morning, at noon, and at bedtime. Apply topically to left shoulder, left knee and neck for M/S pain   NON FORMULARY Diet Type:  Regular   Nutritional Supplements (ENSURE ENLIVE PO) Take 237 mLs by mouth daily.    OXYGEN  Inhale 2 L into the lungs at bedtime.    pantoprazole (PROTONIX) 40 MG tablet Take 40 mg by mouth daily.    Polyethyl Glycol-Propyl Glycol (SYSTANE OP) Place 1 drop into both eyes every 4 (four) hours. For dry eyes  Administer 15 minutes after administering Restasis eye drops.   polyethylene glycol (MIRALAX / GLYCOLAX) packet Take 17  g by mouth daily as needed. For constipation   traMADol (ULTRAM) 50 MG tablet Take 1 tablet (50 mg total) by mouth every 8 (eight) hours.   [DISCONTINUED] Albuterol (VENTOLIN IN) Inhale into the lungs.     No facility-administered encounter medications on file as of 11/15/2020.     SIGNIFICANT DIAGNOSTIC EXAMS  PREVIOUS  05-09-20: KUB normal KUB  05-09-20: thoracic spine x-ray:Multilevel degenerative disc disease with dextroscoliosis.  05-09-20: lumbar spine x-ray:  Multilevel degenerative disc disease with no acute fracture.  NO NEW EXAMS.     LABS REVIEWED: PREVIOUS    04-25-20: wbc 5.5; hgb 12.3; hct 40.2 mcv 83.8 plt 219; glucose 96; bun 26; creat 0.94; k+ 4.2; na++ 138; ca 8.8 liver normal albumin 3.3   09-14-20: wbc 5.7; hgb 13.1; hct 42.3; mcv 84.9 plt 201   TODAY  10-20-20: glucose 68; bun 22; creat 0.90; k+ 3.9; na++ 138; ca 8.7 liver normal albumin 3.6   Review of Systems  Constitutional: Negative for malaise/fatigue.  Respiratory: Negative for cough and shortness of breath.   Cardiovascular: Negative for chest pain, palpitations and leg swelling.  Gastrointestinal: Negative for abdominal pain, constipation and heartburn.  Musculoskeletal: Negative for back pain, joint pain and myalgias.  Skin: Negative.   Neurological: Negative for dizziness.  Psychiatric/Behavioral: The patient is not nervous/anxious.     Physical Exam Constitutional:      General: She is not in acute distress.    Appearance: She is well-developed and well-nourished. She is not diaphoretic.  Neck:     Thyroid: No thyromegaly.  Cardiovascular:     Rate and  Rhythm: Normal rate and regular rhythm.     Pulses: Normal pulses and intact distal pulses.     Heart sounds: Normal heart sounds.     Comments: Pacemaker  Pulmonary:     Effort: Pulmonary effort is normal. No respiratory distress.     Breath sounds: Normal breath sounds.  Abdominal:     General: Bowel sounds are normal. There is no distension.     Palpations: Abdomen is soft.     Tenderness: There is no abdominal tenderness.  Musculoskeletal:     Cervical back: Neck supple.     Right lower leg: Edema present.     Left lower leg: Edema present.     Comments: : Limited range of motion to left shoulder   2+ bilateral lower extremity    Lymphadenopathy:     Cervical: No cervical adenopathy.  Skin:    General: Skin is warm and dry.  Neurological:     Mental Status: She is alert and oriented to person, place, and time.  Psychiatric:        Mood and Affect: Mood and affect and mood normal.      ASSESSMENT/ PLAN:  TODAY;   1. Chronic diastolic heart failure EF 60-65% (06-21-17) is stable will continue lasix twice daily   2. Longstanding persistent atrial fibrillation: is status post pace maker; will continue cardizem cd 180 mg daily for rate control and eliquis 2.5 mg twice daily   3. Hypertensive heart and kidney disease with chronic diastolic congestive heart failure with stage 3 chronic kidney disease is stable b/p 158/70 will continue cardizem cd 180 mg daily is off norvasc   PREVIOUS  4. Weight loss: her weight is stable: 05-25-19: 137 pounds with her current weight of 131 pounds will monitor   5. Chronic obstructive pulmonary disease stable 02 dependent will continue anoro 62.5/25 mcg 1 puff  daily albuterol 2 puffs every hs.  6. Chronic kidney disease stage 3 (moderate) is stable bun 26 creat 0.94 will monitor   7 GERD without esophagitis: is stable will continue protonix 40 mg daily   8. Chronic generalized pain  is stable will continue  tylenol cr 650 mg every 6 hours  with ultram 50 mg every 6 hours will monitor her status.   9. Peripheral neuropathy: is stable will continue gabapentin 100 mg twice daily   10. CVA (cerebrovascular accident) is stable will continue eliquis 2.5 mg twice daily   11. Complete heart block is status post Dt. Jude PTV/DP pace maker will monitor her status   12. GERD without esophagitis: is stable will continue protonix 40 mg daily   13. Dyslipidemia: is stable is off lipitor will monitor  14. Chronic constipation: is stable will continue miralax daily as needed.     MD is aware of resident's narcotic use and is in agreement with current plan of care. We will attempt to wean resident as appropriate.  Ok Edwards NP [facility]  Contact [phone] Monday through Friday 8am- 5pm  After hours call [phone]

## 2020-11-22 ENCOUNTER — Non-Acute Institutional Stay: Payer: Self-pay | Admitting: Adult Health

## 2020-11-22 ENCOUNTER — Encounter: Payer: Self-pay | Admitting: Adult Health

## 2020-11-22 ENCOUNTER — Other Ambulatory Visit: Payer: Self-pay | Admitting: Adult Health

## 2020-11-22 DIAGNOSIS — G588 Other specified mononeuropathies: Secondary | ICD-10-CM

## 2020-11-22 DIAGNOSIS — G8929 Other chronic pain: Secondary | ICD-10-CM

## 2020-11-22 MED ORDER — TRAMADOL HCL 50 MG PO TABS: 25.0000 mg | ORAL_TABLET | Freq: Three times a day (TID) | ORAL | 0 refills | Status: DC

## 2020-11-22 NOTE — Progress Notes (Signed)
Location:  [place] Room Number: 108-W Place of Service:  SNF (31)   CODE STATUS: DNR  Allergies  Allergen Reactions   Sulfur Swelling and Rash    Chief Complaint  Patient presents with   Acute Visit    Medication review    HPI:  She is presently taking ulram 50 mg every 8 hours; gabapentin 100 mg twice daily and biofreeze for her pain management. She has melatonin nightly as needed. This medication should be taken on a routine basis; but has not been taking any doses. There are no reports of uncontrolled pain. There are no reports of insomnia. She is due for a GDR.   Past Medical History:  Diagnosis Date   Atrial fibrillation (HCC)    Bradycardia    Breast nodule 11/15/2014   Breast pain, left 11/15/2014   Carotid artery disease (HCC)    Dizziness    Dyspnea    Previous CPX suggesting possible restrictive physiology, respiratory muscle fatigue, diastolic dysfunction   Essential hypertension, benign    Hyperlipidemia    Oxygen dependent    2 liter   Pacemaker [place]    Rib pain on left side 11/15/2014   Seasonal allergies    Shingles 05/04/2015   Stroke ([place])    Toe fracture, left    left big toe    Past Surgical History:  Procedure Laterality Date   COLONOSCOPY  02/22/2012   Procedure: COLONOSCOPY;  Surgeon: Rogene Houston, MD;  [facility];  Service: Endoscopy;  Laterality: N/A;  100   KNEE SURGERY     bilateral   PACEMAKER IMPLANT N/A 07/17/2017   Procedure: Pacemaker Implant;  Surgeon: Deboraha Sprang, MD;  Location: [place] CV LAB;  Service: Cardiovascular;  Laterality: N/A;    Social History   Socioeconomic History   Marital status: Widowed    Spouse name: Not on file   Number of children: Not on file   Years of education: Not on file   Highest education level: Not on file  Occupational History   Not on file  Tobacco Use   Smoking status: Never Smoker   Smokeless tobacco: Never  Used  Vaping Use   Vaping Use: Never used  Substance and Sexual Activity   Alcohol use: No   Drug use: No   Sexual activity: Never    Birth control/protection: Post-menopausal  Other Topics Concern   Not on file  Social History Narrative   Not on file   Social Determinants of Health   Financial Resource Strain: Not on file  Food Insecurity: Not on file  Transportation Needs: Not on file  Physical Activity: Not on file  Stress: Not on file  Social Connections: Not on file  Intimate Partner Violence: Not on file   Family History  Problem Relation Age of Onset   Stroke Mother    Pneumonia Father    Heart disease Father    Healthy Son    Glaucoma Daughter    Emphysema Daughter    Healthy Daughter    Other Daughter        had knee replacement   Healthy Son    Heart disease Maternal Grandmother       VITAL SIGNS BP (!) 160/76    Pulse 62    Temp 98.5 F (36.9 C)    Resp 18    Ht 5\' 4"  (1.626 m)    Wt 131 lb 6.4 oz (59.6 kg)  SpO2 95%    BMI 22.55 kg/m   Outpatient Encounter Medications as of 11/22/2020  Medication Sig   acetaminophen (TYLENOL) 500 MG tablet Take 1,000 mg by mouth every 8 (eight) hours as needed.   acetaminophen (TYLENOL) 650 MG CR tablet Take 650 mg by mouth every 8 (eight) hours.    albuterol (PROVENTIL HFA;VENTOLIN HFA) 108 (90 Base) MCG/ACT inhaler Inhale 2 puffs into the lungs at bedtime. For SOB or wheezing   ANORO ELLIPTA 62.5-25 MCG/INH AEPB Inhale 1 puff into the lungs daily.    apixaban (ELIQUIS) 2.5 MG TABS tablet Take 1 tablet (2.5 mg total) by mouth 2 (two) times daily. Resume 07/20/17   artificial tears (LACRILUBE) OINT ophthalmic ointment Place 1 application into both eyes at bedtime.   artificial tears (LACRILUBE) OINT ophthalmic ointment Place 1 application into both eyes daily as needed for dry eyes.   cycloSPORINE (RESTASIS) 0.05 % ophthalmic emulsion Place 1 drop into both eyes every 12 (twelve) hours.     diltiazem (CARDIZEM CD) 180 MG 24 hr capsule TAKE ONE CAPSULE BY MOUTH ONCE DAILY.   furosemide (LASIX) 20 MG tablet Take 20 mg by mouth 2 (two) times daily.    gabapentin (NEURONTIN) 100 MG capsule Take 100 mg by mouth 2 (two) times daily. Bilateral Lower Extremity Neuropathic Pain   hydrocortisone (ANUSOL-HC) 2.5 % rectal cream Place 1 application rectally 2 (two) times daily as needed. Apply topically for hemorrhoids as directed    Melatonin 5 MG TABS Take 5 mg by mouth at bedtime as needed.    Menthol, Topical Analgesic, (BIOFREEZE) 4 % GEL Apply 1 application topically in the morning, at noon, and at bedtime. Apply topically to left shoulder, left knee and neck for M/S pain   NON FORMULARY Diet Type:  Regular   Nutritional Supplements (ENSURE ENLIVE PO) Take 237 mLs by mouth daily.    OXYGEN Inhale 2 L into the lungs at bedtime.    pantoprazole (PROTONIX) 40 MG tablet Take 40 mg by mouth daily.    Polyethyl Glycol-Propyl Glycol (SYSTANE OP) Place 1 drop into both eyes every 4 (four) hours. For dry eyes  Administer 15 minutes after administering Restasis eye drops.   polyethylene glycol (MIRALAX / GLYCOLAX) packet Take 17 g by mouth daily as needed. For constipation   traMADol (ULTRAM) 50 MG tablet Take 1 tablet (50 mg total) by mouth every 8 (eight) hours.   [DISCONTINUED] Albuterol (VENTOLIN IN) Inhale into the lungs.     No facility-administered encounter medications on file as of 11/22/2020.     SIGNIFICANT DIAGNOSTIC EXAMS  PREVIOUS  05-09-20: KUB normal KUB  05-09-20: thoracic spine x-ray:Multilevel degenerative disc disease with dextroscoliosis.  05-09-20: lumbar spine x-ray:  Multilevel degenerative disc disease with no acute fracture.  NO NEW EXAMS.     LABS REVIEWED: PREVIOUS    04-25-20: wbc 5.5; hgb 12.3; hct 40.2 mcv 83.8 plt 219; glucose 96; bun 26; creat 0.94; k+ 4.2; na++ 138; ca 8.8 liver normal albumin 3.3   09-14-20: wbc 5.7; hgb 13.1; hct 42.3; mcv  84.9 plt 201  10-20-20: glucose 68; bun 22; creat 0.90; k+ 3.9; na++ 138; ca 8.7 liver normal albumin 3.6  NO NEW LABS.     Review of Systems  Constitutional: Negative for malaise/fatigue.  Respiratory: Negative for cough and shortness of breath.   Cardiovascular: Negative for chest pain, palpitations and leg swelling.  Gastrointestinal: Negative for abdominal pain, constipation and heartburn.  Musculoskeletal: Negative for back pain, joint  pain and myalgias.  Skin: Negative.   Neurological: Negative for dizziness.  Psychiatric/Behavioral: The patient is not nervous/anxious.     Physical Exam Constitutional:      General: She is not in acute distress.    Appearance: She is well-developed and well-nourished. She is not diaphoretic.  Neck:     Thyroid: No thyromegaly.  Cardiovascular:     Rate and Rhythm: Normal rate and regular rhythm.     Pulses: Normal pulses and intact distal pulses.     Heart sounds: Normal heart sounds.     Comments: Pace maker  Pulmonary:     Effort: Pulmonary effort is normal. No respiratory distress.     Breath sounds: Normal breath sounds.  Abdominal:     General: Bowel sounds are normal. There is no distension.     Palpations: Abdomen is soft.     Tenderness: There is no abdominal tenderness.  Musculoskeletal:     Cervical back: Neck supple.     Right lower leg: Edema present.     Left lower leg: Edema present.     Comments: Limited range of motion to left shoulder   2+ bilateral lower extremity     Lymphadenopathy:     Cervical: No cervical adenopathy.  Skin:    General: Skin is warm and dry.  Neurological:     Mental Status: She is alert and oriented to person, place, and time.  Psychiatric:        Mood and Affect: Mood and affect and mood normal.       ASSESSMENT/ PLAN:  TODAY  1. Chronic generalized pain 2. Other mononeuropathy  Will continue gabapentin 100 mg twice daily  biofreeze Will stop melatonin Will lower her ultram  to 25 mg three times daily  Will monitor her response   MD is aware of resident's narcotic use and is in agreement with current plan of care. We will attempt to wean resident as appropriate.  Ok Edwards NP [facility]  Contact [phone] Monday through Friday 8am- 5pm  After hours call [phone]

## 2020-12-08 ENCOUNTER — Encounter: Payer: Self-pay | Admitting: Adult Health

## 2020-12-08 ENCOUNTER — Non-Acute Institutional Stay (SKILLED_NURSING_FACILITY): Payer: Medicare Other | Admitting: Adult Health

## 2020-12-08 DIAGNOSIS — I700 Atherosclerosis of aorta: Secondary | ICD-10-CM | POA: Diagnosis not present

## 2020-12-08 DIAGNOSIS — I4811 Longstanding persistent atrial fibrillation: Secondary | ICD-10-CM

## 2020-12-08 DIAGNOSIS — I5032 Chronic diastolic (congestive) heart failure: Secondary | ICD-10-CM | POA: Diagnosis not present

## 2020-12-08 DIAGNOSIS — I639 Cerebral infarction, unspecified: Secondary | ICD-10-CM

## 2020-12-08 NOTE — Progress Notes (Signed)
Location:  [place] Room Number: 108/W Place of Service:  SNF (31)   CODE STATUS: DNR  Allergies  Allergen Reactions  . Sulfur Swelling and Rash    Chief Complaint  Patient presents with  . Acute Visit    Care Plan Meeting     HPI:  We have come together for her care plan meeting. Family present. BIMS 12/15 mood 2/30. She requires limited to extensive assist adls. Is continent of bladder and bowel. Is able to feed self. On 07-17-20 was eased to the floor with no injury. Her weight is stable at 131.4 pounds; has a good appetite. There are no reports of uncontrolled pain; no reports of anxiety or agitation. She continues to be followed for her chronic illnesses including: Aortic atherosclerosis Cerebrovascular accident (CVA) unspecified mechanism Chronic diastolic congestive heart failure Longstanding persistent atrial fibrillation  Past Medical History:  Diagnosis Date  . Atrial fibrillation ([place])   . Bradycardia   . Breast nodule 11/15/2014  . Breast pain, left 11/15/2014  . Carotid artery disease ([place])   . Dizziness   . Dyspnea    Previous CPX suggesting possible restrictive physiology, respiratory muscle fatigue, diastolic dysfunction  . Essential hypertension, benign   . Hyperlipidemia   . Oxygen dependent    2 liter  . Pacemaker [place]   . Rib pain on left side 11/15/2014  . Seasonal allergies   . Shingles 05/04/2015  . Stroke ([place])   . Toe fracture, left    left big toe    Past Surgical History:  Procedure Laterality Date  . COLONOSCOPY  02/22/2012   Procedure: COLONOSCOPY;  Surgeon: Rogene Houston, MD;  Location[facility];  Service: Endoscopy;  Laterality: N/A;  100  . KNEE SURGERY     bilateral  . PACEMAKER IMPLANT N/A 07/17/2017   Procedure: Pacemaker Implant;  Surgeon: Deboraha Sprang, MD;  Location: [place] CV LAB;  Service: Cardiovascular;  Laterality: N/A;    Social History   Socioeconomic History  . Marital status:  Widowed    Spouse name: Not on file  . Number of children: Not on file  . Years of education: Not on file  . Highest education level: Not on file  Occupational History  . Not on file  Tobacco Use  . Smoking status: Never Smoker  . Smokeless tobacco: Never Used  Vaping Use  . Vaping Use: Never used  Substance and Sexual Activity  . Alcohol use: No  . Drug use: No  . Sexual activity: Never    Birth control/protection: Post-menopausal  Other Topics Concern  . Not on file  Social History Narrative  . Not on file   Social Determinants of Health   Financial Resource Strain: Not on file  Food Insecurity: Not on file  Transportation Needs: Not on file  Physical Activity: Not on file  Stress: Not on file  Social Connections: Not on file  Intimate Partner Violence: Not on file   Family History  Problem Relation Age of Onset  . Stroke Mother   . Pneumonia Father   . Heart disease Father   . Healthy Son   . Glaucoma Daughter   . Emphysema Daughter   . Healthy Daughter   . Other Daughter        had knee replacement  . Healthy Son   . Heart disease Maternal Grandmother       VITAL SIGNS BP (!) 143/65   Pulse 62  Temp 98 F (36.7 C)   Resp 18   Ht 5\' 4"  (1.626 m)   Wt 131 lb 6.4 oz (59.6 kg)   SpO2 99%   BMI 22.55 kg/m   Outpatient Encounter Medications as of 12/08/2020  Medication Sig  . acetaminophen (TYLENOL) 500 MG tablet Take 1,000 mg by mouth every 8 (eight) hours as needed.  12/10/2020 acetaminophen (TYLENOL) 650 MG CR tablet Take 650 mg by mouth every 8 (eight) hours.   Marland Kitchen albuterol (PROVENTIL HFA;VENTOLIN HFA) 108 (90 Base) MCG/ACT inhaler Inhale 2 puffs into the lungs at bedtime. For SOB or wheezing  . alum & mag hydroxide-simeth (MAALOX PLUS) 400-400-40 MG/5ML suspension Take 30 mLs by mouth every 8 (eight) hours as needed for indigestion.  Marland Kitchen ELLIPTA 62.5-25 MCG/INH AEPB Inhale 1 puff into the lungs daily.   01-08-1984 apixaban (ELIQUIS) 2.5 MG TABS tablet Take 1  tablet (2.5 mg total) by mouth 2 (two) times daily. Resume 07/20/17  . artificial tears (LACRILUBE) OINT ophthalmic ointment Place 1 application into both eyes at bedtime.  09/19/17 artificial tears (LACRILUBE) OINT ophthalmic ointment Place 1 application into both eyes daily as needed for dry eyes.  . cycloSPORINE (RESTASIS) 0.05 % ophthalmic emulsion Place 1 drop into both eyes every 12 (twelve) hours.  Marland Kitchen diltiazem (CARDIZEM CD) 180 MG 24 hr capsule TAKE ONE CAPSULE BY MOUTH ONCE DAILY.  . furosemide (LASIX) 20 MG tablet Take 20 mg by mouth 2 (two) times daily.   Marland Kitchen gabapentin (NEURONTIN) 100 MG capsule Take 100 mg by mouth 2 (two) times daily. Bilateral Lower Extremity Neuropathic Pain  . hydrocortisone (ANUSOL-HC) 2.5 % rectal cream Place 1 application rectally 2 (two) times daily as needed. Apply topically for hemorrhoids as directed  . Melatonin 5 MG TABS Take 5 mg by mouth at bedtime as needed.   . Menthol, Topical Analgesic, (BIOFREEZE) 4 % GEL Apply 1 application topically in the morning, at noon, and at bedtime. Apply topically to left shoulder, left knee and neck for M/S pain  . NON FORMULARY Diet Type:  Regular  . Nutritional Supplements (ENSURE ENLIVE PO) Take 237 mLs by mouth daily.   . OXYGEN Inhale 2 L into the lungs at bedtime.   . pantoprazole (PROTONIX) 40 MG tablet Take 40 mg by mouth daily.   Marland Kitchen Glycol-Propyl Glycol (SYSTANE OP) Place 1 drop into both eyes every 4 (four) hours. For dry eyes  Administer 15 minutes after administering Restasis eye drops.  . polyethylene glycol (MIRALAX / GLYCOLAX) packet Take 17 g by mouth daily as needed. For constipation  . traMADol (ULTRAM) 50 MG tablet Take 0.5 tablets (25 mg total) by mouth every 8 (eight) hours.  . [DISCONTINUED] Albuterol (VENTOLIN IN) Inhale into the lungs.     No facility-administered encounter medications on file as of 12/08/2020.     SIGNIFICANT DIAGNOSTIC EXAMS   PREVIOUS  05-09-20: KUB normal KUB  05-09-20:  thoracic spine x-ray:Multilevel degenerative disc disease with dextroscoliosis.  05-09-20: lumbar spine x-ray:  Multilevel degenerative disc disease with no acute fracture.  NO NEW EXAMS.     LABS REVIEWED: PREVIOUS    04-25-20: wbc 5.5; hgb 12.3; hct 40.2 mcv 83.8 plt 219; glucose 96; bun 26; creat 0.94; k+ 4.2; na++ 138; ca 8.8 liver normal albumin 3.3   09-14-20: wbc 5.7; hgb 13.1; hct 42.3; mcv 84.9 plt 201  10-20-20: glucose 68; bun 22; creat 0.90; k+ 3.9; na++ 138; ca 8.7 liver normal albumin 3.6  NO NEW  LABS.    Review of Systems  Constitutional: Negative for malaise/fatigue.  Respiratory: Negative for cough and shortness of breath.   Cardiovascular: Negative for chest pain, palpitations and leg swelling.  Gastrointestinal: Negative for abdominal pain, constipation and heartburn.  Musculoskeletal: Negative for back pain, joint pain and myalgias.  Skin: Negative.   Neurological: Negative for dizziness.  Psychiatric/Behavioral: The patient is not nervous/anxious.     Physical Exam Constitutional:      General: She is not in acute distress.    Appearance: She is well-developed and well-nourished. She is not diaphoretic.  Neck:     Thyroid: No thyromegaly.  Cardiovascular:     Rate and Rhythm: Normal rate and regular rhythm.     Pulses: Normal pulses and intact distal pulses.     Heart sounds: Normal heart sounds.     Comments: Pace maker Pulmonary:     Effort: Pulmonary effort is normal. No respiratory distress.     Breath sounds: Normal breath sounds.  Abdominal:     General: Bowel sounds are normal. There is no distension.     Palpations: Abdomen is soft.     Tenderness: There is no abdominal tenderness.  Musculoskeletal:     Cervical back: Neck supple.     Right lower leg: Edema present.     Left lower leg: Edema present.     Comments: Limited range of motion to left shoulder   2+ bilateral lower extremity      Lymphadenopathy:     Cervical: No cervical  adenopathy.  Skin:    General: Skin is warm and dry.  Neurological:     Mental Status: She is alert and oriented to person, place, and time.  Psychiatric:        Mood and Affect: Mood and affect and mood normal.     ASSESSMENT/ PLAN:  TODAY  1. Aortic atherosclerosis (CT 09-20-17) 2. Cerebrovascular accident (CVA) unspecified mechanism 3. Chronic diastolic congestive heart failure 4. Longstanding persistent atrial fibrillation:   Will continue current medications Will continue current plan of care Will continue to monitor her status.   MD is aware of resident's narcotic use and is in agreement with current plan of care. We will attempt to wean resident as appropriate.  Ok Edwards NP [facility]  Contact [phone] Monday through Friday 8am- 5pm  After hours call [phone]

## 2020-12-15 DIAGNOSIS — I700 Atherosclerosis of aorta: Secondary | ICD-10-CM | POA: Insufficient documentation

## 2020-12-19 ENCOUNTER — Encounter: Payer: Self-pay | Admitting: Adult Health

## 2020-12-19 ENCOUNTER — Other Ambulatory Visit: Payer: Self-pay | Admitting: Adult Health

## 2020-12-19 ENCOUNTER — Non-Acute Institutional Stay (SKILLED_NURSING_FACILITY): Payer: Medicare Other | Admitting: Adult Health

## 2020-12-19 DIAGNOSIS — N1831 Chronic kidney disease, stage 3a: Secondary | ICD-10-CM | POA: Diagnosis not present

## 2020-12-19 DIAGNOSIS — J449 Chronic obstructive pulmonary disease, unspecified: Secondary | ICD-10-CM

## 2020-12-19 DIAGNOSIS — I700 Atherosclerosis of aorta: Secondary | ICD-10-CM

## 2020-12-19 DIAGNOSIS — G588 Other specified mononeuropathies: Secondary | ICD-10-CM | POA: Diagnosis not present

## 2020-12-19 LAB — CUP PACEART REMOTE DEVICE CHECK
Battery Remaining Longevity: 146 mo
Battery Remaining Percentage: 95.5 %
Battery Voltage: 3.01 V
Brady Statistic RV Percent Paced: 99 %
Date Time Interrogation Session: 20211109020012
Implantable Lead Implant Date: 20180808
Implantable Lead Location: 753860
Implantable Lead Model: 1948
Implantable Pulse Generator Implant Date: 20180808
Lead Channel Impedance Value: 580 Ohm
Lead Channel Pacing Threshold Amplitude: 0.5 V
Lead Channel Pacing Threshold Pulse Width: 0.5 ms
Lead Channel Sensing Intrinsic Amplitude: 3.6 mV
Lead Channel Setting Pacing Amplitude: 0.75 V
Lead Channel Setting Pacing Pulse Width: 0.5 ms
Lead Channel Setting Sensing Sensitivity: 0.5 mV
Pulse Gen Model: 1272
Pulse Gen Serial Number: 7957820

## 2020-12-19 MED ORDER — TRAMADOL HCL 50 MG PO TABS: 25.0000 mg | ORAL_TABLET | Freq: Three times a day (TID) | ORAL | 0 refills | Status: DC

## 2020-12-19 NOTE — Progress Notes (Signed)
Location:  [place] Room Number: 108/W Place of Service:  SNF (31)   CODE STATUS: DNR  Allergies  Allergen Reactions  . Sulfur Swelling and Rash    Chief Complaint  Patient presents with  . Medical Management of Chronic Issues           Aortic atherosclerosis:  Chronic obstructive pulmonary disease:  Chronic kidney disease stage 3 (moderate)   Peripheral neuropathy:     HPI:  She is a 87 year old long term resident of this facility being seen for the management of her chronic illnesses: Aortic atherosclerosis:  Chronic obstructive pulmonary disease:  Chronic kidney disease stage 3 (moderate)   Peripheral neuropathy:. She denies any cough or shortness of breath. She continues to have burning pain in both feet which is not being managed with gabapentin 100 mg twice daily    Past Medical History:  Diagnosis Date  . Atrial fibrillation ([place])   . Bradycardia   . Breast nodule 11/15/2014  . Breast pain, left 11/15/2014  . Carotid artery disease ([place])   . Dizziness   . Dyspnea    Previous CPX suggesting possible restrictive physiology, respiratory muscle fatigue, diastolic dysfunction  . Essential hypertension, benign   . Hyperlipidemia   . Oxygen dependent    2 liter  . Pacemaker [place]   . Rib pain on left side 11/15/2014  . Seasonal allergies   . Shingles 05/04/2015  . Stroke ([place])   . Toe fracture, left    left big toe    Past Surgical History:  Procedure Laterality Date  . COLONOSCOPY  02/22/2012   Procedure: COLONOSCOPY;  Surgeon: Rogene Houston, MD;  [facility];  Service: Endoscopy;  Laterality: N/A;  100  . KNEE SURGERY     bilateral  . PACEMAKER IMPLANT N/A 07/17/2017   Procedure: Pacemaker Implant;  Surgeon: Deboraha Sprang, MD;  Location: [facility];  Service: Cardiovascular;  Laterality: N/A;    Social History   Socioeconomic History  . Marital status: Widowed    Spouse name: Not on file  . Number of  children: Not on file  . Years of education: Not on file  . Highest education level: Not on file  Occupational History  . Not on file  Tobacco Use  . Smoking status: Never Smoker  . Smokeless tobacco: Never Used  Vaping Use  . Vaping Use: Never used  Substance and Sexual Activity  . Alcohol use: No  . Drug use: No  . Sexual activity: Never    Birth control/protection: Post-menopausal  Other Topics Concern  . Not on file  Social History Narrative  . Not on file   Social Determinants of Health   Financial Resource Strain: Not on file  Food Insecurity: Not on file  Transportation Needs: Not on file  Physical Activity: Not on file  Stress: Not on file  Social Connections: Not on file  Intimate Partner Violence: Not on file   Family History  Problem Relation Age of Onset  . Stroke Mother   . Pneumonia Father   . Heart disease Father   . Healthy Son   . Glaucoma Daughter   . Emphysema Daughter   . Healthy Daughter   . Other Daughter        had knee replacement  . Healthy Son   . Heart disease Maternal Grandmother       VITAL SIGNS BP (!) 145/75   Pulse 68  Temp (!) 97.3 F (36.3 C)   Resp 20   Ht 5\' 4"  (1.626 m)   Wt 132 lb 9.6 oz (60.1 kg)   SpO2 92%   BMI 22.76 kg/m   Outpatient Encounter Medications as of 12/19/2020  Medication Sig  . acetaminophen (TYLENOL) 500 MG tablet Take 1,000 mg by mouth every 8 (eight) hours as needed.  Marland Kitchen acetaminophen (TYLENOL) 650 MG CR tablet Take 650 mg by mouth every 8 (eight) hours.   Marland Kitchen albuterol (PROVENTIL HFA;VENTOLIN HFA) 108 (90 Base) MCG/ACT inhaler Inhale 2 puffs into the lungs at bedtime. For SOB or wheezing  . alum & mag hydroxide-simeth (MAALOX PLUS) 400-400-40 MG/5ML suspension Take 30 mLs by mouth every 8 (eight) hours as needed for indigestion.  Jearl Klinefelter ELLIPTA 62.5-25 MCG/INH AEPB Inhale 1 puff into the lungs daily.   Marland Kitchen apixaban (ELIQUIS) 2.5 MG TABS tablet Take 1 tablet (2.5 mg total) by mouth 2 (two) times  daily. Resume 07/20/17  . artificial tears (LACRILUBE) OINT ophthalmic ointment Place 1 application into both eyes at bedtime.  Marland Kitchen artificial tears (LACRILUBE) OINT ophthalmic ointment Place 1 application into both eyes daily as needed for dry eyes.  . cycloSPORINE (RESTASIS) 0.05 % ophthalmic emulsion Place 1 drop into both eyes every 12 (twelve) hours.  Marland Kitchen diltiazem (CARDIZEM CD) 180 MG 24 hr capsule TAKE ONE CAPSULE BY MOUTH ONCE DAILY.  . furosemide (LASIX) 20 MG tablet Take 20 mg by mouth 2 (two) times daily.   Marland Kitchen gabapentin (NEURONTIN) 100 MG capsule Take 200 mg by mouth 2 (two) times daily. Bilateral Lower Extremity Neuropathic Pain  . hydrocortisone (ANUSOL-HC) 2.5 % rectal cream Place 1 application rectally 2 (two) times daily as needed. Apply topically for hemorrhoids as directed  . Melatonin 5 MG TABS Take 5 mg by mouth at bedtime as needed.   . Menthol, Topical Analgesic, (BIOFREEZE) 4 % GEL Apply 1 application topically in the morning, at noon, and at bedtime. Apply topically to left shoulder, left knee and neck for M/S pain  . NON FORMULARY Diet Type:  Regular  . Nutritional Supplements (ENSURE ENLIVE PO) Take 237 mLs by mouth daily.   . OXYGEN Inhale 2 L into the lungs at bedtime.   . pantoprazole (PROTONIX) 40 MG tablet Take 40 mg by mouth 2 (two) times daily.  Vladimir Faster Glycol-Propyl Glycol (SYSTANE OP) Place 1 drop into both eyes every 4 (four) hours. For dry eyes  Administer 15 minutes after administering Restasis eye drops.  . polyethylene glycol (MIRALAX / GLYCOLAX) packet Take 17 g by mouth daily as needed. For constipation  . traMADol (ULTRAM) 50 MG tablet Take 0.5 tablets (25 mg total) by mouth every 8 (eight) hours.  . [DISCONTINUED] Albuterol (VENTOLIN IN) Inhale into the lungs.     No facility-administered encounter medications on file as of 12/19/2020.     SIGNIFICANT DIAGNOSTIC EXAMS   PREVIOUS  05-09-20: KUB normal KUB  05-09-20: thoracic spine x-ray:Multilevel  degenerative disc disease with dextroscoliosis.  05-09-20: lumbar spine x-ray:  Multilevel degenerative disc disease with no acute fracture.  NO NEW EXAMS.     LABS REVIEWED: PREVIOUS    04-25-20: wbc 5.5; hgb 12.3; hct 40.2 mcv 83.8 plt 219; glucose 96; bun 26; creat 0.94; k+ 4.2; na++ 138; ca 8.8 liver normal albumin 3.3   09-14-20: wbc 5.7; hgb 13.1; hct 42.3; mcv 84.9 plt 201  10-20-20: glucose 68; bun 22; creat 0.90; k+ 3.9; na++ 138; ca 8.7 liver normal albumin 3.6  NO NEW LABS .   Review of Systems  Constitutional: Negative for malaise/fatigue.  Respiratory: Negative for cough and shortness of breath.   Cardiovascular: Negative for chest pain, palpitations and leg swelling.  Gastrointestinal: Negative for abdominal pain, constipation and heartburn.  Musculoskeletal: Negative for back pain, joint pain and myalgias.  Skin: Negative.   Neurological: Negative for dizziness.       Has burning pain in both feet   Psychiatric/Behavioral: The patient is not nervous/anxious.     Physical Exam Constitutional:      General: She is not in acute distress.    Appearance: She is well-developed and well-nourished. She is not diaphoretic.  Neck:     Thyroid: No thyromegaly.  Cardiovascular:     Rate and Rhythm: Normal rate and regular rhythm.     Pulses: Normal pulses and intact distal pulses.     Heart sounds: Normal heart sounds.     Comments: Pace maker  Pulmonary:     Effort: Pulmonary effort is normal. No respiratory distress.     Breath sounds: Normal breath sounds.  Abdominal:     General: Bowel sounds are normal. There is no distension.     Palpations: Abdomen is soft.     Tenderness: There is no abdominal tenderness.  Musculoskeletal:        General: No edema.     Cervical back: Neck supple.     Right lower leg: No edema.     Left lower leg: No edema.     Comments:  Limited range of motion to left shoulder   2+ bilateral lower extremity       Lymphadenopathy:      Cervical: No cervical adenopathy.  Skin:    General: Skin is warm and dry.  Neurological:     Mental Status: She is alert and oriented to person, place, and time.  Psychiatric:        Mood and Affect: Mood and affect and mood normal.       ASSESSMENT/ PLAN:  TODAY;   1. Aortic atherosclerosis: is stable will monitor   2. Chronic obstructive pulmonary disease: is stable 02 dependent will continue anoro 62.5/25 mcg 1 puff daily and albuterol 2 puffs every hs  3. Chronic kidney disease stage 3 (moderate) is stable bun 26; creat 0.94 will monitor  4.  Peripheral neuropathy: is worse will increase to gabapentin to 200 mg twice daily   PREVIOUS  5.  GERD without esophagitis: is stable will continue protonix 40 mg daily   6. Chronic generalized pain  is stable will continue  tylenol cr 650 mg every 6 hours with ultram 25 mg three times daily  will monitor her status.   7. CVA (cerebrovascular accident) is stable will continue eliquis 2.5 mg twice daily   8. Complete heart block is status post Dt. Jude PTV/DP pace maker will monitor her status   9. GERD without esophagitis: is stable will continue protonix 40 mg daily   10. Dyslipidemia: is stable is off lipitor will monitor  11. Chronic constipation: is stable will continue miralax daily as needed.   12. Chronic diastolic heart failure EF 60-65% (06-21-17) is stable will continue lasix twice daily   13. Longstanding persistent atrial fibrillation: is status post pace maker; will continue cardizem cd 180 mg daily for rate control and eliquis 2.5 mg twice daily   14. Hypertensive heart and kidney disease with chronic diastolic congestive heart failure with stage 3 chronic kidney disease is  stable b/p 145/75 will continue cardizem cd 180 mg daily is off norvasc         MD is aware of resident's narcotic use and is in agreement with current plan of care. We will attempt to wean resident as appropriate.  Ok Edwards  NP [facility]  Contact [phone] Monday through Friday 8am- 5pm  After hours call [phone]

## 2021-01-11 DIAGNOSIS — I130 Hypertensive heart and chronic kidney disease with heart failure and stage 1 through stage 4 chronic kidney disease, or unspecified chronic kidney disease: Secondary | ICD-10-CM | POA: Diagnosis not present

## 2021-01-11 DIAGNOSIS — I63312 Cerebral infarction due to thrombosis of left middle cerebral artery: Secondary | ICD-10-CM | POA: Diagnosis not present

## 2021-01-11 DIAGNOSIS — Z1159 Encounter for screening for other viral diseases: Secondary | ICD-10-CM | POA: Diagnosis not present

## 2021-01-13 DIAGNOSIS — Z1159 Encounter for screening for other viral diseases: Secondary | ICD-10-CM | POA: Diagnosis not present

## 2021-01-13 DIAGNOSIS — I130 Hypertensive heart and chronic kidney disease with heart failure and stage 1 through stage 4 chronic kidney disease, or unspecified chronic kidney disease: Secondary | ICD-10-CM | POA: Diagnosis not present

## 2021-01-13 DIAGNOSIS — I63312 Cerebral infarction due to thrombosis of left middle cerebral artery: Secondary | ICD-10-CM | POA: Diagnosis not present

## 2021-01-16 ENCOUNTER — Encounter: Payer: Self-pay | Admitting: Adult Health

## 2021-01-16 ENCOUNTER — Non-Acute Institutional Stay (SKILLED_NURSING_FACILITY): Payer: Medicare Other | Admitting: Adult Health

## 2021-01-16 DIAGNOSIS — G8929 Other chronic pain: Secondary | ICD-10-CM | POA: Diagnosis not present

## 2021-01-16 DIAGNOSIS — Z950 Presence of cardiac pacemaker: Secondary | ICD-10-CM

## 2021-01-16 DIAGNOSIS — Z1159 Encounter for screening for other viral diseases: Secondary | ICD-10-CM | POA: Diagnosis not present

## 2021-01-16 DIAGNOSIS — I639 Cerebral infarction, unspecified: Secondary | ICD-10-CM

## 2021-01-16 DIAGNOSIS — K219 Gastro-esophageal reflux disease without esophagitis: Secondary | ICD-10-CM | POA: Diagnosis not present

## 2021-01-16 DIAGNOSIS — I130 Hypertensive heart and chronic kidney disease with heart failure and stage 1 through stage 4 chronic kidney disease, or unspecified chronic kidney disease: Secondary | ICD-10-CM | POA: Diagnosis not present

## 2021-01-16 DIAGNOSIS — R52 Pain, unspecified: Secondary | ICD-10-CM | POA: Diagnosis not present

## 2021-01-16 DIAGNOSIS — I63312 Cerebral infarction due to thrombosis of left middle cerebral artery: Secondary | ICD-10-CM | POA: Diagnosis not present

## 2021-01-16 NOTE — Progress Notes (Signed)
Location:  [place] [place] Room Number: 108/W Place of Service:  SNF (31)   CODE STATUS: DNR  Allergies  Allergen Reactions  . Elemental Sulfur Swelling and Rash    Chief Complaint  Patient presents with  . Medical Management of Chronic Issues              GERD without esophagitis:   Chronic generalized pain:  CVA (cerebrovascular accident)    HPI:  She is a 87 year old long term resident of this facility being seen for the management of her chronic illnesses: GERD without esophagitis:   Chronic generalized pain:  CVA (cerebrovascular accident). She is not having pain today; states that she feels a little bit better. She denies any changes in her appetite; no constipation.   Past Medical History:  Diagnosis Date  . Atrial fibrillation ([place])   . Bradycardia   . Breast nodule 11/15/2014  . Breast pain, left 11/15/2014  . Carotid artery disease ([place])   . Dizziness   . Dyspnea    Previous CPX suggesting possible restrictive physiology, respiratory muscle fatigue, diastolic dysfunction  . Essential hypertension, benign   . Hyperlipidemia   . Oxygen dependent    2 liter  . Pacemaker [place]   . Rib pain on left side 11/15/2014  . Seasonal allergies   . Shingles 05/04/2015  . Stroke ([place])   . Toe fracture, left    left big toe    Past Surgical History:  Procedure Laterality Date  . COLONOSCOPY  02/22/2012   Procedure: COLONOSCOPY;  Surgeon: Rogene Houston, MD;  [facility];  Service: Endoscopy;  Laterality: N/A;  100  . KNEE SURGERY     bilateral  . PACEMAKER IMPLANT N/A 07/17/2017   Procedure: Pacemaker Implant;  Surgeon: Deboraha Sprang, MD;  Location: [facility];  Service: Cardiovascular;  Laterality: N/A;    Social History   Socioeconomic History  . Marital status: Widowed    Spouse name: Not on file  . Number of children: Not on file  . Years of education: Not on file  . Highest education level: Not on file  Occupational  History  . Not on file  Tobacco Use  . Smoking status: Never Smoker  . Smokeless tobacco: Never Used  Vaping Use  . Vaping Use: Never used  Substance and Sexual Activity  . Alcohol use: No  . Drug use: No  . Sexual activity: Never    Birth control/protection: Post-menopausal  Other Topics Concern  . Not on file  Social History Narrative  . Not on file   Social Determinants of Health   Financial Resource Strain: Not on file  Food Insecurity: Not on file  Transportation Needs: Not on file  Physical Activity: Not on file  Stress: Not on file  Social Connections: Not on file  Intimate Partner Violence: Not on file   Family History  Problem Relation Age of Onset  . Stroke Mother   . Pneumonia Father   . Heart disease Father   . Healthy Son   . Glaucoma Daughter   . Emphysema Daughter   . Healthy Daughter   . Other Daughter        had knee replacement  . Healthy Son   . Heart disease Maternal Grandmother       VITAL SIGNS BP (!) 156/75   Pulse 62   Temp (!) 97.3 F (36.3 C)   Resp 20   Ht  5\' 4"  (1.626 m)   Wt 146 lb 9.6 oz (66.5 kg)   SpO2 93%   BMI 25.16 kg/m   Outpatient Encounter Medications as of 01/16/2021  Medication Sig  . acetaminophen (TYLENOL) 500 MG tablet Take 1,000 mg by mouth every 8 (eight) hours as needed.  Marland Kitchen acetaminophen (TYLENOL) 650 MG CR tablet Take 650 mg by mouth every 8 (eight) hours.   Marland Kitchen albuterol (PROVENTIL HFA;VENTOLIN HFA) 108 (90 Base) MCG/ACT inhaler Inhale 2 puffs into the lungs at bedtime. For SOB or wheezing  . alum & mag hydroxide-simeth (MAALOX PLUS) 400-400-40 MG/5ML suspension Take 30 mLs by mouth every 8 (eight) hours as needed for indigestion.  Jearl Klinefelter ELLIPTA 62.5-25 MCG/INH AEPB Inhale 1 puff into the lungs daily.   Marland Kitchen apixaban (ELIQUIS) 2.5 MG TABS tablet Take 1 tablet (2.5 mg total) by mouth 2 (two) times daily. Resume 07/20/17  . artificial tears (LACRILUBE) OINT ophthalmic ointment Place 1 application into both eyes at  bedtime.  Marland Kitchen artificial tears (LACRILUBE) OINT ophthalmic ointment Place 1 application into both eyes daily as needed for dry eyes.  . cycloSPORINE (RESTASIS) 0.05 % ophthalmic emulsion Place 1 drop into both eyes every 12 (twelve) hours.  Marland Kitchen diltiazem (CARDIZEM CD) 180 MG 24 hr capsule TAKE ONE CAPSULE BY MOUTH ONCE DAILY.  . furosemide (LASIX) 20 MG tablet Take 20 mg by mouth 2 (two) times daily.   Marland Kitchen gabapentin (NEURONTIN) 100 MG capsule Take 200 mg by mouth 2 (two) times daily. Bilateral Lower Extremity Neuropathic Pain  . hydrocortisone (ANUSOL-HC) 2.5 % rectal cream Place 1 application rectally 2 (two) times daily as needed. Apply topically for hemorrhoids as directed  . Melatonin 5 MG TABS Take 5 mg by mouth at bedtime as needed.   . Menthol, Topical Analgesic, (BIOFREEZE) 4 % GEL Apply 1 application topically in the morning, at noon, and at bedtime. Apply topically to left shoulder, left knee and neck for M/S pain  . NON FORMULARY Diet Type:  Regular  . Nutritional Supplements (ENSURE ENLIVE PO) Take 237 mLs by mouth daily.   . OXYGEN Inhale 2 L into the lungs at bedtime.   . pantoprazole (PROTONIX) 40 MG tablet Take 40 mg by mouth 2 (two) times daily.  Vladimir Faster Glycol-Propyl Glycol (SYSTANE OP) Place 1 drop into both eyes every 4 (four) hours. For dry eyes  Administer 15 minutes after administering Restasis eye drops.  . polyethylene glycol (MIRALAX / GLYCOLAX) packet Take 17 g by mouth daily as needed. For constipation  . traMADol (ULTRAM) 50 MG tablet Take 0.5 tablets (25 mg total) by mouth every 8 (eight) hours.  . [DISCONTINUED] Albuterol (VENTOLIN IN) Inhale into the lungs.     No facility-administered encounter medications on file as of 01/16/2021.     SIGNIFICANT DIAGNOSTIC EXAMS   PREVIOUS  05-09-20: KUB normal KUB  05-09-20: thoracic spine x-ray:Multilevel degenerative disc disease with dextroscoliosis.  05-09-20: lumbar spine x-ray:  Multilevel degenerative disc disease  with no acute fracture.  NO NEW EXAMS.     LABS REVIEWED: PREVIOUS    04-25-20: wbc 5.5; hgb 12.3; hct 40.2 mcv 83.8 plt 219; glucose 96; bun 26; creat 0.94; k+ 4.2; na++ 138; ca 8.8 liver normal albumin 3.3   09-14-20: wbc 5.7; hgb 13.1; hct 42.3; mcv 84.9 plt 201  10-20-20: glucose 68; bun 22; creat 0.90; k+ 3.9; na++ 138; ca 8.7 liver normal albumin 3.6   NO NEW LABS .   Review of Systems  Constitutional:  Negative for malaise/fatigue.  Respiratory: Negative for cough and shortness of breath.   Cardiovascular: Negative for chest pain, palpitations and leg swelling.  Gastrointestinal: Negative for abdominal pain, constipation and heartburn.  Musculoskeletal: Negative for back pain, joint pain and myalgias.  Skin: Negative.   Neurological: Negative for dizziness.  Psychiatric/Behavioral: The patient is not nervous/anxious.       Physical Exam Constitutional:      General: She is not in acute distress.    Appearance: She is well-developed and well-nourished. She is not diaphoretic.  Neck:     Thyroid: No thyromegaly.  Cardiovascular:     Rate and Rhythm: Normal rate and regular rhythm.     Pulses: Normal pulses and intact distal pulses.     Heart sounds: Normal heart sounds.     Comments: Pacemaker  Pulmonary:     Effort: Pulmonary effort is normal. No respiratory distress.     Breath sounds: Normal breath sounds.  Abdominal:     General: Bowel sounds are normal. There is no distension.     Palpations: Abdomen is soft.     Tenderness: There is no abdominal tenderness.  Musculoskeletal:     Cervical back: Neck supple.     Right lower leg: Edema present.     Left lower leg: Edema present.     Comments:  Limited range of motion to left shoulder   2+ bilateral lower extremity        Lymphadenopathy:     Cervical: No cervical adenopathy.  Skin:    General: Skin is warm and dry.  Neurological:     Mental Status: She is alert and oriented to person, place, and time.   Psychiatric:        Mood and Affect: Mood and affect and mood normal.     ASSESSMENT/ PLAN:  TODAY;   1. GERD without esophagitis: is stable will continue protonix 40 mg daily   2. Chronic generalized pain: is stable will continue tylenol cr 650 mg every 6 hours with ultram 25 mg three times daily   3. CVA (cerebrovascular accident) is stable will continue eliquis 2.5 mg twice daily    PREVIOUS  4. Complete heart block is status post Dt. Jude PTV/DP pace maker will monitor her status   5. GERD without esophagitis: is stable will continue protonix 40 mg daily   6. Dyslipidemia: is stable is off lipitor will monitor  7. Chronic constipation: is stable will continue miralax daily as needed.   8. Chronic diastolic heart failure EF 60-65% (06-21-17) is stable will continue lasix twice daily   9. Longstanding persistent atrial fibrillation: is status post pace maker; will continue cardizem cd 180 mg daily for rate control and eliquis 2.5 mg twice daily   10. Hypertensive heart and kidney disease with chronic diastolic congestive heart failure with stage 3 chronic kidney disease is stable b/p 156/75 will continue cardizem cd 180 mg daily is off norvasc   11. Aortic atherosclerosis: is stable will monitor   12. Chronic obstructive pulmonary disease: is stable 02 dependent will continue anoro 62.5/25 mcg 1 puff daily and albuterol 2 puffs every hs  13. Chronic kidney disease stage 3 (moderate) is stable bun 26; creat 0.94 will monitor  14.  Peripheral neuropathy: is stable  will continue  gabapentin to 200 mg twice daily       MD is aware of resident's narcotic use and is in agreement with current plan of care. We will attempt to wean  resident as appropriate.  Ok Edwards NP [facility]  Contact [phone] Monday through Friday 8am- 5pm  After hours call [phone]

## 2021-01-17 ENCOUNTER — Other Ambulatory Visit: Payer: Self-pay | Admitting: Adult Health

## 2021-01-17 ENCOUNTER — Ambulatory Visit (INDEPENDENT_AMBULATORY_CARE_PROVIDER_SITE_OTHER): Payer: Medicare Other

## 2021-01-17 DIAGNOSIS — I442 Atrioventricular block, complete: Secondary | ICD-10-CM

## 2021-01-17 LAB — CUP PACEART REMOTE DEVICE CHECK
Battery Remaining Longevity: 146 mo
Battery Remaining Percentage: 95.5 %
Battery Voltage: 3.01 V
Brady Statistic RV Percent Paced: 99 %
Date Time Interrogation Session: 20220208020014
Implantable Lead Implant Date: 20180808
Implantable Lead Location: 753860
Implantable Lead Model: 1948
Implantable Pulse Generator Implant Date: 20180808
Lead Channel Impedance Value: 550 Ohm
Lead Channel Pacing Threshold Amplitude: 0.375 V
Lead Channel Pacing Threshold Pulse Width: 0.5 ms
Lead Channel Sensing Intrinsic Amplitude: 4.2 mV
Lead Channel Setting Pacing Amplitude: 0.625
Lead Channel Setting Pacing Pulse Width: 0.5 ms
Lead Channel Setting Sensing Sensitivity: 0.5 mV
Pulse Gen Model: 1272
Pulse Gen Serial Number: 7957820

## 2021-01-17 MED ORDER — TRAMADOL HCL 50 MG PO TABS: 25.0000 mg | ORAL_TABLET | Freq: Three times a day (TID) | ORAL | 0 refills | Status: DC

## 2021-01-18 DIAGNOSIS — I63312 Cerebral infarction due to thrombosis of left middle cerebral artery: Secondary | ICD-10-CM | POA: Diagnosis not present

## 2021-01-18 DIAGNOSIS — I130 Hypertensive heart and chronic kidney disease with heart failure and stage 1 through stage 4 chronic kidney disease, or unspecified chronic kidney disease: Secondary | ICD-10-CM | POA: Diagnosis not present

## 2021-01-18 DIAGNOSIS — Z1159 Encounter for screening for other viral diseases: Secondary | ICD-10-CM | POA: Diagnosis not present

## 2021-01-20 DIAGNOSIS — Z1159 Encounter for screening for other viral diseases: Secondary | ICD-10-CM | POA: Diagnosis not present

## 2021-01-20 DIAGNOSIS — I130 Hypertensive heart and chronic kidney disease with heart failure and stage 1 through stage 4 chronic kidney disease, or unspecified chronic kidney disease: Secondary | ICD-10-CM | POA: Diagnosis not present

## 2021-01-20 DIAGNOSIS — I63312 Cerebral infarction due to thrombosis of left middle cerebral artery: Secondary | ICD-10-CM | POA: Diagnosis not present

## 2021-01-23 DIAGNOSIS — I130 Hypertensive heart and chronic kidney disease with heart failure and stage 1 through stage 4 chronic kidney disease, or unspecified chronic kidney disease: Secondary | ICD-10-CM | POA: Diagnosis not present

## 2021-01-23 DIAGNOSIS — Z1159 Encounter for screening for other viral diseases: Secondary | ICD-10-CM | POA: Diagnosis not present

## 2021-01-23 DIAGNOSIS — I63312 Cerebral infarction due to thrombosis of left middle cerebral artery: Secondary | ICD-10-CM | POA: Diagnosis not present

## 2021-01-23 NOTE — Progress Notes (Signed)
Remote pacemaker transmission.   

## 2021-01-25 DIAGNOSIS — I63312 Cerebral infarction due to thrombosis of left middle cerebral artery: Secondary | ICD-10-CM | POA: Diagnosis not present

## 2021-01-25 DIAGNOSIS — Z1159 Encounter for screening for other viral diseases: Secondary | ICD-10-CM | POA: Diagnosis not present

## 2021-01-25 DIAGNOSIS — I130 Hypertensive heart and chronic kidney disease with heart failure and stage 1 through stage 4 chronic kidney disease, or unspecified chronic kidney disease: Secondary | ICD-10-CM | POA: Diagnosis not present

## 2021-02-08 ENCOUNTER — Non-Acute Institutional Stay (SKILLED_NURSING_FACILITY): Payer: Medicare Other | Admitting: Adult Health

## 2021-02-08 ENCOUNTER — Encounter: Payer: Self-pay | Admitting: Adult Health

## 2021-02-08 DIAGNOSIS — I639 Cerebral infarction, unspecified: Secondary | ICD-10-CM | POA: Diagnosis not present

## 2021-02-08 DIAGNOSIS — K219 Gastro-esophageal reflux disease without esophagitis: Secondary | ICD-10-CM

## 2021-02-08 DIAGNOSIS — I442 Atrioventricular block, complete: Secondary | ICD-10-CM | POA: Diagnosis not present

## 2021-02-08 DIAGNOSIS — Z950 Presence of cardiac pacemaker: Secondary | ICD-10-CM | POA: Diagnosis not present

## 2021-02-08 DIAGNOSIS — N1831 Chronic kidney disease, stage 3a: Secondary | ICD-10-CM | POA: Diagnosis not present

## 2021-02-08 DIAGNOSIS — N1832 Chronic kidney disease, stage 3b: Secondary | ICD-10-CM

## 2021-02-08 DIAGNOSIS — I700 Atherosclerosis of aorta: Secondary | ICD-10-CM | POA: Diagnosis not present

## 2021-02-08 DIAGNOSIS — I5032 Chronic diastolic (congestive) heart failure: Secondary | ICD-10-CM

## 2021-02-08 DIAGNOSIS — J449 Chronic obstructive pulmonary disease, unspecified: Secondary | ICD-10-CM | POA: Diagnosis not present

## 2021-02-08 DIAGNOSIS — G2581 Restless legs syndrome: Secondary | ICD-10-CM

## 2021-02-08 DIAGNOSIS — I130 Hypertensive heart and chronic kidney disease with heart failure and stage 1 through stage 4 chronic kidney disease, or unspecified chronic kidney disease: Secondary | ICD-10-CM

## 2021-02-08 DIAGNOSIS — G588 Other specified mononeuropathies: Secondary | ICD-10-CM

## 2021-02-08 NOTE — Progress Notes (Signed)
Provider: Location  [facility]   PCP: Gerlene Fee, NP  Extended Emergency Contact Information Primary Emergency Contact: Adin Hector, [place] [address] [name] of [place] Phone: [phone] Relation: Son Secondary Emergency Contact: [name], [place] [address] [name] of [place] Phone: [phone] Mobile Phone: [phone] Relation: Son  Codes status: dnr Goals of care: advanced directive information Advanced Directives 01/16/2021  Does Patient Have a Medical Advance Directive? Yes  Type of Advance Directive Out of facility DNR (pink MOST or yellow form)  Does patient want to make changes to medical advance directive? No - Patient declined  Copy of [place] in Chart? -  Would patient like information on creating a medical advance directive? -  Pre-existing out of facility DNR order (yellow form or pink MOST form) Yellow form placed in chart (order not valid for inpatient use)     Allergies  Allergen Reactions  . Elemental Sulfur Swelling and Rash    Chief Complaint  Patient presents with  . Annual Exam    HPI  She is a 87 year old long term resident of this facility being seen for her annual exam. She has not required any hospital admissions. She has not been to the ED. She has gone out on family visits. She does have chronic bilateral shoulder pain. She does have a good appetite; no reports of insomnia present. She continues to be followed for her chronic illnesses including:  Chronic constipation:   Chronic diastolic heart failure  Longstanding persistent atrial fibrillation:  Past Medical History:  Diagnosis Date  . Atrial fibrillation ([place])   . Bradycardia   . Breast nodule 11/15/2014  . Breast pain, left 11/15/2014  . Carotid artery disease ([place])   . Dizziness   . Dyspnea    Previous CPX suggesting possible restrictive physiology, respiratory muscle fatigue, diastolic  dysfunction  . Essential hypertension, benign   . Hyperlipidemia   . Oxygen dependent    2 liter  . Pacemaker [place]   . Rib pain on left side 11/15/2014  . Seasonal allergies   . Shingles 05/04/2015  . Stroke ([place])   . Toe fracture, left    left big toe   Past Surgical History:  Procedure Laterality Date  . COLONOSCOPY  02/22/2012   Procedure: COLONOSCOPY;  Surgeon: Rogene Houston, MD;  [facility];  Service: Endoscopy;  Laterality: N/A;  100  . KNEE SURGERY     bilateral  . PACEMAKER IMPLANT N/A 07/17/2017   Procedure: Pacemaker Implant;  Surgeon: Deboraha Sprang, MD;  Location: [facility];  Service: Cardiovascular;  Laterality: N/A;    reports that she has never smoked. She has never used smokeless tobacco. She reports that she does not drink alcohol and does not use drugs. Social History   Tobacco Use  . Smoking status: Never Smoker  . Smokeless tobacco: Never Used  Vaping Use  . Vaping Use: Never used  Substance Use Topics  . Alcohol use: No  . Drug use: No   Family History  Problem Relation Age of Onset  . Stroke Mother   . Pneumonia Father   . Heart disease Father   . Healthy Son   . Glaucoma Daughter   . Emphysema Daughter   . Healthy Daughter   . Other Daughter  had knee replacement  . Healthy Son   . Heart disease Maternal Grandmother     Pertinent  Health Maintenance Due  Topic Date Due  . INFLUENZA VACCINE  Completed  . PNA vac Low Risk Adult  Completed  . DEXA SCAN  Discontinued   Fall Risk  03/01/2020 02/13/2020 09/11/2018 09/02/2017  Falls in the past year? 1 1 No Yes  Number falls in past yr: 0 0 - 1  Injury with Fall? 0 0 - No  Risk for fall due to : Impaired balance/gait;Impaired mobility History of fall(s);Impaired mobility - -   Depression screen [facility] 2/9 03/01/2020 02/13/2020 09/11/2018 09/02/2017  Decreased Interest 0 0 0 0  Down, Depressed, Hopeless 0 0 0 0  PHQ - 2 Score 0 0 0 0  Some recent data might be hidden     Functional Status Survey:    Outpatient Encounter Medications as of 02/08/2021  Medication Sig  . acetaminophen (TYLENOL) 500 MG tablet Take 1,000 mg by mouth every 8 (eight) hours as needed.  Marland Kitchen acetaminophen (TYLENOL) 650 MG CR tablet Take 650 mg by mouth every 8 (eight) hours.   Marland Kitchen albuterol (PROVENTIL HFA;VENTOLIN HFA) 108 (90 Base) MCG/ACT inhaler Inhale 2 puffs into the lungs at bedtime. For SOB or wheezing  . alum & mag hydroxide-simeth (MAALOX PLUS) 400-400-40 MG/5ML suspension Take 30 mLs by mouth every 8 (eight) hours as needed for indigestion.  Jearl Klinefelter ELLIPTA 62.5-25 MCG/INH AEPB Inhale 1 puff into the lungs daily.   Marland Kitchen apixaban (ELIQUIS) 2.5 MG TABS tablet Take 1 tablet (2.5 mg total) by mouth 2 (two) times daily. Resume 07/20/17  . artificial tears (LACRILUBE) OINT ophthalmic ointment Place 1 application into both eyes at bedtime.  Marland Kitchen artificial tears (LACRILUBE) OINT ophthalmic ointment Place 1 application into both eyes daily as needed for dry eyes.  . cycloSPORINE (RESTASIS) 0.05 % ophthalmic emulsion Place 1 drop into both eyes every 12 (twelve) hours.  Marland Kitchen diltiazem (CARDIZEM CD) 180 MG 24 hr capsule TAKE ONE CAPSULE BY MOUTH ONCE DAILY.  . furosemide (LASIX) 20 MG tablet Take 20 mg by mouth 2 (two) times daily.   Marland Kitchen gabapentin (NEURONTIN) 100 MG capsule Take 200 mg by mouth 2 (two) times daily. Bilateral Lower Extremity Neuropathic Pain  . hydrocortisone (ANUSOL-HC) 2.5 % rectal cream Place 1 application rectally 2 (two) times daily as needed. Apply topically for hemorrhoids as directed  . Melatonin 5 MG TABS Take 5 mg by mouth at bedtime as needed.   . Menthol, Topical Analgesic, (BIOFREEZE) 4 % GEL Apply 1 application topically in the morning, at noon, and at bedtime. Apply topically to left shoulder, left knee and neck for M/S pain  . NON FORMULARY Diet Type:  Regular  . Nutritional Supplements (ENSURE ENLIVE PO) Take 237 mLs by mouth daily.   . OXYGEN Inhale 2 L into the  lungs at bedtime.   . pantoprazole (PROTONIX) 40 MG tablet Take 40 mg by mouth 2 (two) times daily.  Vladimir Faster Glycol-Propyl Glycol (SYSTANE OP) Place 1 drop into both eyes every 4 (four) hours. For dry eyes  Administer 15 minutes after administering Restasis eye drops.  . polyethylene glycol (MIRALAX / GLYCOLAX) packet Take 17 g by mouth daily as needed. For constipation  . traMADol (ULTRAM) 50 MG tablet Take 0.5 tablets (25 mg total) by mouth every 8 (eight) hours.  . [DISCONTINUED] Albuterol (VENTOLIN IN) Inhale into the lungs.     No facility-administered encounter medications on file  as of 02/08/2021.     Vitals:   02/08/21 1034  BP: (!) 162/78  Pulse: 68  Resp: 20  Temp: (!) 97.5 F (36.4 C)  Weight: 146 lb 9.6 oz (66.5 kg)  Height: 5\' 4"  (1.626 m)   Body mass index is 25.16 kg/m.  DIAGNOSTIC EXAMS   PREVIOUS  05-09-20: KUB normal KUB  05-09-20: thoracic spine x-ray:Multilevel degenerative disc disease with dextroscoliosis.  05-09-20: lumbar spine x-ray:  Multilevel degenerative disc disease with no acute fracture.  NO NEW EXAMS.     LABS REVIEWED: PREVIOUS    04-25-20: wbc 5.5; hgb 12.3; hct 40.2 mcv 83.8 plt 219; glucose 96; bun 26; creat 0.94; k+ 4.2; na++ 138; ca 8.8 liver normal albumin 3.3   09-14-20: wbc 5.7; hgb 13.1; hct 42.3; mcv 84.9 plt 201  10-20-20: glucose 68; bun 22; creat 0.90; k+ 3.9; na++ 138; ca 8.7 liver normal albumin 3.6   NO NEW LABS .   Review of Systems  Constitutional: Negative for malaise/fatigue.  Respiratory: Negative for cough and shortness of breath.   Cardiovascular: Negative for chest pain, palpitations and leg swelling.  Gastrointestinal: Negative for abdominal pain, constipation and heartburn.  Musculoskeletal: Positive for joint pain. Negative for back pain and myalgias.       Has chronic joint pain   Skin: Negative.   Neurological: Negative for dizziness.  Psychiatric/Behavioral: The patient is not nervous/anxious.      Physical Exam Constitutional:      General: She is not in acute distress.    Appearance: She is well-developed and well-nourished. She is not diaphoretic.  HENT:     Right Ear: Tympanic membrane normal.     Left Ear: Tympanic membrane normal.     Mouth/Throat:     Mouth: Mucous membranes are moist.     Pharynx: Oropharynx is clear.  Eyes:     Conjunctiva/sclera: Conjunctivae normal.  Neck:     Thyroid: No thyromegaly.  Cardiovascular:     Rate and Rhythm: Normal rate and regular rhythm.     Pulses: Normal pulses and intact distal pulses.     Heart sounds: Normal heart sounds.  Pulmonary:     Effort: Pulmonary effort is normal. No respiratory distress.     Breath sounds: Normal breath sounds.  Abdominal:     General: Bowel sounds are normal. There is no distension.     Palpations: Abdomen is soft.     Tenderness: There is no abdominal tenderness.  Musculoskeletal:        General: No edema. Normal range of motion.     Cervical back: Neck supple.     Right lower leg: No edema.     Left lower leg: No edema.  Lymphadenopathy:     Cervical: No cervical adenopathy.  Skin:    General: Skin is warm and dry.  Neurological:     Mental Status: She is alert. Mental status is at baseline.  Psychiatric:        Mood and Affect: Mood and affect and mood normal.      ASSESSMENT/ PLAN:  TODAY;   1. GERD without esophagitis: is stable will continue protonix 40 mg daily   2. Chronic generalized pain: is stable will continue tylenol cr 650 mg every 6 hours with ultram 25 mg three times daily   3. CVA (cerebrovascular accident) is stable will continue eliquis 2.5 mg twice daily   4. Complete heart block is status post Dt. Jude PTV/DP pace maker will monitor  her status   5. GERD without esophagitis: is stable will continue protonix 40 mg daily   6. Dyslipidemia: is stable is off lipitor will monitor  7. Chronic constipation: is stable will continue miralax daily as needed.   8.  Chronic diastolic heart failure EF 60-65% (06-21-17) is stable will continue lasix twice daily   9. Longstanding persistent atrial fibrillation: is status post pace maker; will continue cardizem cd 180 mg daily for rate control and eliquis 2.5 mg twice daily   10. Hypertensive heart and kidney disease with chronic diastolic congestive heart failure with stage 3 chronic kidney disease is stable b/p 156/75 will continue cardizem cd 180 mg daily is off norvasc   11. Aortic atherosclerosis: is stable will monitor   12. Chronic obstructive pulmonary disease: is stable 02 dependent will continue anoro 62.5/25 mcg 1 puff daily and albuterol 2 puffs every hs  13. Chronic kidney disease stage 3 (moderate) is stable bun 26; creat 0.94 will monitor  14.  Peripheral neuropathy: is stable  will continue  gabapentin to 200 mg twice daily       MD is aware of resident's narcotic use and is in agreement with current plan of care. We will attempt to wean resident as appropriate.  Ok Edwards NP [facility]  Contact [phone] Monday through Friday 8am- 5pm  After hours call [phone]

## 2021-02-09 ENCOUNTER — Other Ambulatory Visit: Payer: Self-pay | Admitting: Adult Health

## 2021-02-09 MED ORDER — TRAMADOL HCL 50 MG PO TABS: 25.0000 mg | ORAL_TABLET | Freq: Three times a day (TID) | ORAL | 0 refills | Status: DC

## 2021-02-20 ENCOUNTER — Other Ambulatory Visit: Payer: Self-pay | Admitting: Adult Health

## 2021-02-28 DIAGNOSIS — L603 Nail dystrophy: Secondary | ICD-10-CM | POA: Diagnosis not present

## 2021-02-28 DIAGNOSIS — M2041 Other hammer toe(s) (acquired), right foot: Secondary | ICD-10-CM | POA: Diagnosis not present

## 2021-02-28 DIAGNOSIS — I739 Peripheral vascular disease, unspecified: Secondary | ICD-10-CM | POA: Diagnosis not present

## 2021-02-28 DIAGNOSIS — B351 Tinea unguium: Secondary | ICD-10-CM | POA: Diagnosis not present

## 2021-02-28 DIAGNOSIS — M2042 Other hammer toe(s) (acquired), left foot: Secondary | ICD-10-CM | POA: Diagnosis not present

## 2021-03-02 ENCOUNTER — Non-Acute Institutional Stay (SKILLED_NURSING_FACILITY): Payer: Medicare Other | Admitting: Adult Health

## 2021-03-02 ENCOUNTER — Encounter: Payer: Self-pay | Admitting: Adult Health

## 2021-03-02 DIAGNOSIS — I442 Atrioventricular block, complete: Secondary | ICD-10-CM

## 2021-03-02 DIAGNOSIS — I700 Atherosclerosis of aorta: Secondary | ICD-10-CM | POA: Diagnosis not present

## 2021-03-02 DIAGNOSIS — I5032 Chronic diastolic (congestive) heart failure: Secondary | ICD-10-CM

## 2021-03-02 NOTE — Progress Notes (Signed)
Location:  [facility] Room Number: 631 Place of Service:  SNF (31)   CODE STATUS: dnr  Allergies  Allergen Reactions  . Elemental Sulfur Swelling and Rash    Chief Complaint  Patient presents with  . Acute Visit    Care plan meeting     HPI:  We have come together for her care plan meeting. BIMS 15/15 mood 3/30.  She is nonambulatory ; she is able to feed herself. She requires limited to extensive assistance with her adls. She is occasionally incontinent of bladder and bowel. No recent falls. She continues to wear TED hose uses 02 at night. Therapy none at this time.    Weight is stable is 139.8 pounds usual mid 130's regular diet good appetite.    She continues to be followed for her chronic illnesses including: Chronic diastolic congestive heart failure Aortic atherosclerosis  Complete heart block   Past Medical History:  Diagnosis Date  . Atrial fibrillation ([place])   . Bradycardia   . Breast nodule 11/15/2014  . Breast pain, left 11/15/2014  . Carotid artery disease ([place])   . Dizziness   . Dyspnea    Previous CPX suggesting possible restrictive physiology, respiratory muscle fatigue, diastolic dysfunction  . Essential hypertension, benign   . Hyperlipidemia   . Oxygen dependent    2 liter  . Pacemaker [place]   . Rib pain on left side 11/15/2014  . Seasonal allergies   . Shingles 05/04/2015  . Stroke ([place])   . Toe fracture, left    left big toe    Past Surgical History:  Procedure Laterality Date  . COLONOSCOPY  02/22/2012   Procedure: COLONOSCOPY;  Surgeon: Rogene Houston, MD;  [facility];  Service: Endoscopy;  Laterality: N/A;  100  . KNEE SURGERY     bilateral  . PACEMAKER IMPLANT N/A 07/17/2017   Procedure: Pacemaker Implant;  Surgeon: Deboraha Sprang, MD;  Location: [facility];  Service: Cardiovascular;  Laterality: N/A;    Social History   Socioeconomic History  . Marital status: Widowed    Spouse name: Not  on file  . Number of children: Not on file  . Years of education: Not on file  . Highest education level: Not on file  Occupational History  . Not on file  Tobacco Use  . Smoking status: Never Smoker  . Smokeless tobacco: Never Used  Vaping Use  . Vaping Use: Never used  Substance and Sexual Activity  . Alcohol use: No  . Drug use: No  . Sexual activity: Never    Birth control/protection: Post-menopausal  Other Topics Concern  . Not on file  Social History Narrative  . Not on file   Social Determinants of Health   Financial Resource Strain: Not on file  Food Insecurity: Not on file  Transportation Needs: Not on file  Physical Activity: Not on file  Stress: Not on file  Social Connections: Not on file  Intimate Partner Violence: Not on file   Family History  Problem Relation Age of Onset  . Stroke Mother   . Pneumonia Father   . Heart disease Father   . Healthy Son   . Glaucoma Daughter   . Emphysema Daughter   . Healthy Daughter   . Other Daughter        had knee replacement  . Healthy Son   . Heart disease Maternal Grandmother       VITAL  SIGNS BP (!) 155/66   Pulse 90   Temp 98.2 F (36.8 C)   Resp 18   Ht 5\' 4"  (1.626 m)   Wt 111 lb 9.6 oz (50.6 kg)   SpO2 98%   BMI 19.16 kg/m   Outpatient Encounter Medications as of 03/02/2021  Medication Sig  . acetaminophen (TYLENOL) 500 MG tablet Take 1,000 mg by mouth every 8 (eight) hours as needed.  Marland Kitchen acetaminophen (TYLENOL) 650 MG CR tablet Take 650 mg by mouth every 8 (eight) hours.   Marland Kitchen albuterol (PROVENTIL HFA;VENTOLIN HFA) 108 (90 Base) MCG/ACT inhaler Inhale 2 puffs into the lungs at bedtime. For SOB or wheezing  . alum & mag hydroxide-simeth (MAALOX PLUS) 400-400-40 MG/5ML suspension Take 30 mLs by mouth every 8 (eight) hours as needed for indigestion.  Jearl Klinefelter ELLIPTA 62.5-25 MCG/INH AEPB Inhale 1 puff into the lungs daily.   Marland Kitchen apixaban (ELIQUIS) 2.5 MG TABS tablet Take 1 tablet (2.5 mg total) by  mouth 2 (two) times daily. Resume 07/20/17  . artificial tears (LACRILUBE) OINT ophthalmic ointment Place 1 application into both eyes at bedtime.  Marland Kitchen artificial tears (LACRILUBE) OINT ophthalmic ointment Place 1 application into both eyes daily as needed for dry eyes.  . cycloSPORINE (RESTASIS) 0.05 % ophthalmic emulsion Place 1 drop into both eyes every 12 (twelve) hours.  Marland Kitchen diltiazem (CARDIZEM CD) 180 MG 24 hr capsule TAKE ONE CAPSULE BY MOUTH ONCE DAILY.  . furosemide (LASIX) 20 MG tablet Take 20 mg by mouth 2 (two) times daily.   Marland Kitchen gabapentin (NEURONTIN) 100 MG capsule Take 200 mg by mouth 2 (two) times daily. Bilateral Lower Extremity Neuropathic Pain  . hydrocortisone (ANUSOL-HC) 2.5 % rectal cream Place 1 application rectally 2 (two) times daily as needed. Apply topically for hemorrhoids as directed  . Melatonin 5 MG TABS Take 5 mg by mouth at bedtime as needed.   . Menthol, Topical Analgesic, (BIOFREEZE) 4 % GEL Apply 1 application topically in the morning, at noon, and at bedtime. Apply topically to left shoulder, left knee and neck for M/S pain  . NON FORMULARY Diet Type:  Regular  . Nutritional Supplements (ENSURE ENLIVE PO) Take 237 mLs by mouth daily.   . OXYGEN Inhale 2 L into the lungs at bedtime.   . pantoprazole (PROTONIX) 40 MG tablet Take 40 mg by mouth 2 (two) times daily.  Vladimir Faster Glycol-Propyl Glycol (SYSTANE OP) Place 1 drop into both eyes every 4 (four) hours. For dry eyes  Administer 15 minutes after administering Restasis eye drops.  . polyethylene glycol (MIRALAX / GLYCOLAX) packet Take 17 g by mouth daily as needed. For constipation  . traMADol (ULTRAM) 50 MG tablet Take 0.5 tablets (25 mg total) by mouth every 8 (eight) hours.  . [DISCONTINUED] Albuterol (VENTOLIN IN) Inhale into the lungs.     No facility-administered encounter medications on file as of 03/02/2021.     SIGNIFICANT DIAGNOSTIC EXAMS   PREVIOUS  05-09-20: KUB normal KUB  05-09-20: thoracic  spine x-ray:Multilevel degenerative disc disease with dextroscoliosis.  05-09-20: lumbar spine x-ray:  Multilevel degenerative disc disease with no acute fracture.  NO NEW EXAMS.     LABS REVIEWED: PREVIOUS    04-25-20: wbc 5.5; hgb 12.3; hct 40.2 mcv 83.8 plt 219; glucose 96; bun 26; creat 0.94; k+ 4.2; na++ 138; ca 8.8 liver normal albumin 3.3   09-14-20: wbc 5.7; hgb 13.1; hct 42.3; mcv 84.9 plt 201  10-20-20: glucose 68; bun 22; creat 0.90; k+  3.9; na++ 138; ca 8.7 liver normal albumin 3.6   NO NEW LABS .    Review of Systems  Constitutional: Negative for malaise/fatigue.  Respiratory: Negative for cough and shortness of breath.   Cardiovascular: Negative for chest pain, palpitations and leg swelling.  Gastrointestinal: Negative for abdominal pain, constipation and heartburn.  Musculoskeletal: Negative for back pain, joint pain and myalgias.  Skin: Negative.   Neurological: Negative for dizziness.  Psychiatric/Behavioral: The patient is not nervous/anxious.     Physical Exam Constitutional:      General: She is not in acute distress.    Appearance: She is well-developed. She is not diaphoretic.  Neck:     Thyroid: No thyromegaly.  Cardiovascular:     Rate and Rhythm: Normal rate and regular rhythm.     Pulses: Normal pulses.     Heart sounds: Normal heart sounds.  Pulmonary:     Effort: Pulmonary effort is normal. No respiratory distress.     Breath sounds: Normal breath sounds.  Abdominal:     General: Bowel sounds are normal. There is no distension.     Palpations: Abdomen is soft.     Tenderness: There is no abdominal tenderness.  Musculoskeletal:        General: Normal range of motion.     Cervical back: Neck supple.     Right lower leg: No edema.     Left lower leg: No edema.  Lymphadenopathy:     Cervical: No cervical adenopathy.  Skin:    General: Skin is warm and dry.  Neurological:     Mental Status: She is alert and oriented to person, place, and time.   Psychiatric:        Mood and Affect: Mood normal.       ASSESSMENT/ PLAN:  TODAY  1. Chronic diastolic congestive heart failure 2. Aortic atherosclerosis  3. Complete heart block   Will continue current plan of care Will continue current medications Will continue to monitor her status    Time spent with patient 40 minutes >50% spent with counseling regarding activities; medications; overall medical status.    Ok Edwards NP [facility]  Contact [phone] Monday through Friday 8am- 5pm  After hours call [phone]

## 2021-03-06 ENCOUNTER — Non-Acute Institutional Stay (SKILLED_NURSING_FACILITY): Payer: Medicare Other | Admitting: Adult Health

## 2021-03-06 DIAGNOSIS — Z0000 Encounter for general adult medical examination without abnormal findings: Secondary | ICD-10-CM

## 2021-03-06 NOTE — Progress Notes (Signed)
Subjective:   Wendy Perkins is a 87 y.o. female who presents for Medicare Annual (Subsequent) preventive examination.  Review of Systems    Review of Systems  Constitutional: Negative for malaise/fatigue.  Respiratory: Negative for cough and shortness of breath.   Cardiovascular: Negative for chest pain, palpitations and leg swelling.  Gastrointestinal: Negative for abdominal pain, constipation and heartburn.  Musculoskeletal: Negative for back pain, joint pain and myalgias.  Skin: Negative.   Neurological: Negative for dizziness.  Psychiatric/Behavioral: The patient is not nervous/anxious.     Cardiac Risk Factors include: advanced age (>44men, >81 women);sedentary lifestyle     Objective:    Today's Vitals   03/06/21 1225 03/06/21 1228  BP: (!) 141/69   Pulse: 62   Resp: 18   Temp: (!) 97.2 F (36.2 C)   SpO2: 95%   Weight: 139 lb 12.8 oz (63.4 kg)   Height: 5\' 4"  (1.626 m)   PainSc:  0-No pain   Body mass index is 24 kg/m.  Advanced Directives 01/16/2021 12/19/2020 12/08/2020 11/22/2020 11/15/2020 10/18/2020 10/07/2020  Does Patient Have a Medical Advance Directive? Yes Yes Yes Yes Yes Yes Yes  Type of Advance Directive Out of facility DNR (pink MOST or yellow form) Out of facility DNR (pink MOST or yellow form) Out of facility DNR (pink MOST or yellow form) Out of facility DNR (pink MOST or yellow form) Out of facility DNR (pink MOST or yellow form) Out of facility DNR (pink MOST or yellow form) Out of facility DNR (pink MOST or yellow form)  Does patient want to make changes to medical advance directive? No - Patient declined No - Patient declined No - Patient declined No - Patient declined No - Patient declined No - Patient declined No - Patient declined  Copy of [place] in [place]  Would patient like information on creating a medical advance directive? - - - - - - -  Pre-existing out of facility DNR order (yellow form or pink MOST  form) Yellow form placed in chart (order not valid for inpatient use) Yellow form placed in chart (order not valid for inpatient use) Yellow form placed in chart (order not valid for inpatient use) Yellow form placed in chart (order not valid for inpatient use) Yellow form placed in chart (order not valid for inpatient use) Yellow form placed in chart (order not valid for inpatient use) Yellow form placed in chart (order not valid for inpatient use)    Current Medications (verified) Outpatient Encounter Medications as of 03/06/2021  Medication Sig  . acetaminophen (TYLENOL) 500 MG tablet Take 1,000 mg by mouth every 8 (eight) hours as needed.  Marland Kitchen acetaminophen (TYLENOL) 650 MG CR tablet Take 650 mg by mouth every 8 (eight) hours.   Marland Kitchen albuterol (PROVENTIL HFA;VENTOLIN HFA) 108 (90 Base) MCG/ACT inhaler Inhale 2 puffs into the lungs at bedtime. For SOB or wheezing  . alum & mag hydroxide-simeth (MAALOX PLUS) 400-400-40 MG/5ML suspension Take 30 mLs by mouth every 8 (eight) hours as needed for indigestion.  Jearl Klinefelter ELLIPTA 62.5-25 MCG/INH AEPB Inhale 1 puff into the lungs daily.   Marland Kitchen apixaban (ELIQUIS) 2.5 MG TABS tablet Take 1 tablet (2.5 mg total) by mouth 2 (two) times daily. Resume 07/20/17  . artificial tears (LACRILUBE) OINT ophthalmic ointment Place 1 application into both eyes at bedtime.  Marland Kitchen artificial tears (LACRILUBE) OINT ophthalmic ointment Place 1 application into both eyes daily as needed for  dry eyes.  . cycloSPORINE (RESTASIS) 0.05 % ophthalmic emulsion Place 1 drop into both eyes every 12 (twelve) hours.  Marland Kitchen diltiazem (CARDIZEM CD) 180 MG 24 hr capsule TAKE ONE CAPSULE BY MOUTH ONCE DAILY.  . furosemide (LASIX) 20 MG tablet Take 20 mg by mouth 2 (two) times daily.   Marland Kitchen gabapentin (NEURONTIN) 100 MG capsule Take 200 mg by mouth 2 (two) times daily. Bilateral Lower Extremity Neuropathic Pain  . hydrocortisone (ANUSOL-HC) 2.5 % rectal cream Place 1 application rectally 2 (two) times daily as  needed. Apply topically for hemorrhoids as directed  . Melatonin 5 MG TABS Take 5 mg by mouth at bedtime as needed.   . Menthol, Topical Analgesic, (BIOFREEZE) 4 % GEL Apply 1 application topically in the morning, at noon, and at bedtime. Apply topically to left shoulder, left knee and neck for M/S pain  . NON FORMULARY Diet Type:  Regular  . Nutritional Supplements (ENSURE ENLIVE PO) Take 237 mLs by mouth daily.   . OXYGEN Inhale 2 L into the lungs at bedtime.   . pantoprazole (PROTONIX) 40 MG tablet Take 40 mg by mouth 2 (two) times daily.  Vladimir Faster Glycol-Propyl Glycol (SYSTANE OP) Place 1 drop into both eyes every 4 (four) hours. For dry eyes  Administer 15 minutes after administering Restasis eye drops.  . polyethylene glycol (MIRALAX / GLYCOLAX) packet Take 17 g by mouth daily as needed. For constipation  . traMADol (ULTRAM) 50 MG tablet Take 0.5 tablets (25 mg total) by mouth every 8 (eight) hours.  . [DISCONTINUED] Albuterol (VENTOLIN IN) Inhale into the lungs.     No facility-administered encounter medications on file as of 03/06/2021.    Allergies (verified) Elemental sulfur   History: Past Medical History:  Diagnosis Date  . Atrial fibrillation ([address])   . Bradycardia   . Breast nodule 11/15/2014  . Breast pain, left 11/15/2014  . Carotid artery disease ([place])   . Dizziness   . Dyspnea    Previous CPX suggesting possible restrictive physiology, respiratory muscle fatigue, diastolic dysfunction  . Essential hypertension, benign   . Hyperlipidemia   . Oxygen dependent    2 liter  . Pacemaker [place]   . Rib pain on left side 11/15/2014  . Seasonal allergies   . Shingles 05/04/2015  . Stroke ([place])   . Toe fracture, left    left big toe   Past Surgical History:  Procedure Laterality Date  . COLONOSCOPY  02/22/2012   Procedure: COLONOSCOPY;  Surgeon: Rogene Houston, MD;  [facility];  Service: Endoscopy;  Laterality: N/A;  100  . KNEE SURGERY     bilateral   . PACEMAKER IMPLANT N/A 07/17/2017   Procedure: Pacemaker Implant;  Surgeon: Deboraha Sprang, MD;  Location: [facility];  Service: Cardiovascular;  Laterality: N/A;   Family History  Problem Relation Age of Onset  . Stroke Mother   . Pneumonia Father   . Heart disease Father   . Healthy Son   . Glaucoma Daughter   . Emphysema Daughter   . Healthy Daughter   . Other Daughter        had knee replacement  . Healthy Son   . Heart disease Maternal Grandmother    Social History   Socioeconomic History  . Marital status: Widowed    Spouse name: Not on file  . Number of children: Not on file  . Years of education: Not on file  . Highest education  level: Not on file  Occupational History  . Not on file  Tobacco Use  . Smoking status: Never Smoker  . Smokeless tobacco: Never Used  Vaping Use  . Vaping Use: Never used  Substance and Sexual Activity  . Alcohol use: No  . Drug use: No  . Sexual activity: Never    Birth control/protection: Post-menopausal  Other Topics Concern  . Not on file  Social History Narrative  . Not on file   Social Determinants of Health   Financial Resource Strain: Not on file  Food Insecurity: Not on file  Transportation Needs: Not on file  Physical Activity: Not on file  Stress: Not on file  Social Connections: Not on file    Tobacco Counseling Counseling given: Not Answered   Clinical Intake:  Pre-visit preparation completed: Yes  Pain : No/denies pain Pain Score: 0-No pain     BMI - recorded: 24 Nutritional Status: BMI of 19-24  Normal Nutritional Risks: Unintentional weight loss Diabetes: No  How often do you need to have someone help you when you read instructions, pamphlets, or other written materials from your doctor or pharmacy?: 5 - [place] Needed?: No      Activities of Daily Living In your present state of health, do you have any difficulty performing the following activities:  03/06/2021  Hearing? Y  Vision? N  Difficulty concentrating or making decisions? Y  Walking or climbing stairs? Y  Dressing or bathing? Y  Doing errands, shopping? Y  Preparing Food and eating ? Y  Using the Toilet? Y  In the past six months, have you accidently leaked urine? Y  Do you have problems with loss of bowel control? Y  Managing your Medications? Y  Managing your Finances? Y  Housekeeping or managing your Housekeeping? Y  Some recent data might be hidden    Patient Care Team: Gerlene Fee, NP as PCP - General ([place], [place] ([place])  Indicate any recent [place] you may have received from other than [facility] providers in the past year (date may be approximate).     Assessment:   This is a routine wellness examination for Wendy Perkins.  Hearing/Vision screen No exam data present  Dietary issues and exercise activities discussed: Current Exercise Habits: The patient does not participate in regular exercise at present, Exercise limited by: None identified  Goals    . Absence of Fall and Fall-Related Injury     Evidence-based guidance:   Assess fall risk using a validated tool when available. Consider balance and gait impairment, muscle weakness, diminished vision or hearing, environmental hazards, presence of urinary or bowel urgency and/or incontinence.   Communicate fall injury risk to interprofessional healthcare team.   Develop a fall prevention plan with the patient and family.   Promote use of personal vision and auditory aids.   Promote reorientation, appropriate sensory stimulation, and routines to decrease risk of fall when changes in mental status are present.   Assess assistance level required for safe and effective self-care; consider referral for home care.   Encourage physical activity, such as performance of self-care at highest level of ability, strength and balance exercise program, and provision of  appropriate assistive devices; refer to rehabilitation therapy.   Refer to community-based fall prevention program where available.   If fall occurs, determine the cause and revise fall injury prevention plan.   Regularly review medication contribution to fall risk; consider risk related to polypharmacy  and age.   Refer to pharmacist for consultation when concerns about medications are revealed.   Balance adequate pain management with potential for oversedation.   Provide guidance related to environmental modifications.   Consider supplementation with Vitamin D.   Notes:     . Follow up with Primary Care Provider    . General - Client will not be readmitted within 30 days ([facility])      Depression Screen PHQ 2/9 Scores 03/06/2021 03/01/2020 02/13/2020 09/11/2018 09/02/2017  PHQ - 2 Score 0 0 0 0 0    Fall Risk Fall Risk  03/06/2021 03/01/2020 02/13/2020 09/11/2018 09/02/2017  Falls in the past year? 0 1 1 No Yes  Number falls in past yr: 0 0 0 - 1  Injury with Fall? 0 0 0 - No  Risk for fall due to : Impaired mobility;Impaired balance/gait Impaired balance/gait;Impaired mobility History of fall(s);Impaired mobility - -  Follow up Follow up appointment - - - -    FALL RISK PREVENTION PERTAINING TO THE HOME:  Any stairs in or around the home? no If so, are there any without handrails? n/a Home free of loose throw rugs in walkways, pet beds, electrical cords, etc? yes  Adequate lighting in your home to reduce risk of falls? Yes   ASSISTIVE DEVICES UTILIZED TO PREVENT FALLS:  Life alert? no Use of a cane, walker or w/c?yes Grab bars in the bathroom?yes  Shower chair or bench in shower? Yes  Elevated toilet seat or a handicapped toilet? Yes   TIMED UP AND GO:  Was the test performed? no  Nonambulatory   Cognitive Function: MMSE - Mini Mental State Exam 03/06/2021  Orientation to time 5  Orientation to Place 5  Registration 3  Attention/ Calculation 3  Recall 3  Language- name  2 objects 2  Language- repeat 1  Language- follow 3 step command 3  Language- read & follow direction 1  Write a sentence 0  Copy design 0  Total score 26     6CIT Screen 03/06/2021 03/01/2020 09/11/2018 09/02/2017  What Year? 0 points 0 points 0 points 4 points  What month? 0 points - 0 points 3 points  What time? 0 points 3 points 0 points 0 points  Count back from 20 2 points 2 points 0 points 0 points  Months in reverse 2 points 4 points 4 points 4 points  Repeat phrase 2 points 4 points 4 points 2 points  Total Score 6 - 8 13    Immunizations Immunization History  Administered Date(s) Administered  . Influenza-Unspecified 10/10/2013, 09/07/2016, 09/11/2017, 09/11/2018, 09/14/2019, 09/16/2020  . Moderna Sars-Covid-2 Vaccination 12/16/2019, 01/13/2020, 10/15/2020  . Pneumococcal Conjugate-13 09/13/2017  . Pneumococcal-Unspecified 09/20/2016  . Tdap 09/16/2017  . Zoster 12/05/2017   Vaccine per facility  Screening Tests Health Maintenance  Topic Date Due  . TETANUS/TDAP  09/17/2027  . INFLUENZA VACCINE  Completed  . COVID-19 Vaccine  Completed  . PNA vac Low Risk Adult  Completed  . HPV VACCINES  Aged Out  . DEXA SCAN  Discontinued    Health Maintenance  There are no preventive care reminders to display for this patient.   Lung Cancer Screening: (Low Dose CT Chest recommended if Age 52-80 years, 30 pack-year currently smoking OR have quit w/in 15years.) does not  qualify.   Lung Cancer Screening Referral: n/a  Additional Screening:  Hepatitis C Screening: doe snot qualify   Vision Screening: Recommended annual ophthalmology exams for early detection of glaucoma  and other disorders of the eye. Is the patient up to date with their annual eye exam?  yes  Who is the provider or what is the name of the office in which the patient attends annual eye exams? Per facility  If pt is not established with a provider, would they like to be referred to a provider to establish  care?n/a   Dental Screening: Recommended annual dental exams for proper oral hygiene  Community Resource Referral / Chronic Care Management: CRR required this visit?  no   CCM required this visit?  no     Plan:     I have personally reviewed and noted the following in the patient's chart:   . Medical and social history . Use of alcohol, tobacco or illicit drugs  . Current medications and supplements . Functional ability and status . Nutritional status . Physical activity . Advanced directives . List of other physicians . Hospitalizations, surgeries, and ER visits in previous 12 months . Vitals . Screenings to include cognitive, depression, and falls . Referrals and appointments  In addition, I have reviewed and discussed with patient certain preventive protocols, quality metrics, and best practice recommendations. A written personalized care plan for preventive services as well as general preventive health recommendations were provided to patient.     Gerlene Fee, NP   03/06/2021

## 2021-03-06 NOTE — Patient Instructions (Signed)
   Wendy Perkins , Thank you for taking time to come for your Medicare Wellness Visit. I appreciate your ongoing commitment to your health goals. Please review the following plan we discussed and let me know if I can assist you in the future.   These are the goals we discussed: Goals    . Absence of Fall and Fall-Related Injury     Evidence-based guidance:   Assess fall risk using a validated tool when available. Consider balance and gait impairment, muscle weakness, diminished vision or hearing, environmental hazards, presence of urinary or bowel urgency and/or incontinence.   Communicate fall injury risk to interprofessional healthcare team.   Develop a fall prevention plan with the patient and family.   Promote use of personal vision and auditory aids.   Promote reorientation, appropriate sensory stimulation, and routines to decrease risk of fall when changes in mental status are present.   Assess assistance level required for safe and effective self-care; consider referral for home care.   Encourage physical activity, such as performance of self-care at highest level of ability, strength and balance exercise program, and provision of appropriate assistive devices; refer to rehabilitation therapy.   Refer to community-based fall prevention program where available.   If fall occurs, determine the cause and revise fall injury prevention plan.   Regularly review medication contribution to fall risk; consider risk related to polypharmacy and age.   Refer to pharmacist for consultation when concerns about medications are revealed.   Balance adequate pain management with potential for oversedation.   Provide guidance related to environmental modifications.   Consider supplementation with Vitamin D.   Notes:     . Follow up with Primary Care Provider    . General - Client will not be readmitted within 30 days ([facility])       This is a list of the screening recommended for you and due  dates:  Health Maintenance  Topic Date Due  . Tetanus Vaccine  09/17/2027  . Flu Shot  Completed  . COVID-19 Vaccine  Completed  . Pneumonia vaccines  Completed  . HPV Vaccine  Aged Out  . DEXA scan (bone density measurement)  Discontinued

## 2021-03-08 ENCOUNTER — Ambulatory Visit: Payer: Medicare Other | Admitting: Podiatry

## 2021-03-13 ENCOUNTER — Other Ambulatory Visit: Payer: Self-pay | Admitting: Adult Health

## 2021-03-13 MED ORDER — TRAMADOL HCL 50 MG PO TABS: 25.0000 mg | ORAL_TABLET | Freq: Three times a day (TID) | ORAL | 0 refills | Status: DC

## 2021-03-16 ENCOUNTER — Non-Acute Institutional Stay (SKILLED_NURSING_FACILITY): Payer: Medicare Other | Admitting: Internal Medicine

## 2021-03-16 ENCOUNTER — Encounter: Payer: Self-pay | Admitting: Internal Medicine

## 2021-03-16 DIAGNOSIS — E441 Mild protein-calorie malnutrition: Secondary | ICD-10-CM

## 2021-03-16 DIAGNOSIS — J449 Chronic obstructive pulmonary disease, unspecified: Secondary | ICD-10-CM

## 2021-03-16 DIAGNOSIS — E46 Unspecified protein-calorie malnutrition: Secondary | ICD-10-CM | POA: Insufficient documentation

## 2021-03-16 DIAGNOSIS — N1831 Chronic kidney disease, stage 3a: Secondary | ICD-10-CM

## 2021-03-16 DIAGNOSIS — I4811 Longstanding persistent atrial fibrillation: Secondary | ICD-10-CM | POA: Diagnosis not present

## 2021-03-16 NOTE — Progress Notes (Signed)
NURSING HOME LOCATION: [facility] ROOM NUMBER:  18 W  CODE STATUS:  DNR  PCP:  Bard Herbert NP  This is a nursing facility follow up visit of chronic medical diagnoses & to document compliance with Regulation 483.30 (c) in The [place] Manual Phase 2 which mandates caregiver visit ( visits can alternate among physician, PA or NP as per statutes) within 10 days of 30 days / 60 days/ 90 days post admission to SNF date    Interim medical record and care since last SNF visit was updated with review of diagnostic studies and change in clinical status since last visit were documented.  HPI: She is a permanent resident of facility with diagnoses of history of stroke, oxygen dependent COPD, dyslipidemia, essential hypertension, PAD, and A. fib. Surgeries and procedures include [employer] pacemaker placement  Review of systems: She has only minor complaints, chiefly that "my legs are stiff".  She does validate occasional abdominal discomfort.  She states that remotely she had black stool which was apparently related to medication, probably iron.  Constitutional: No fever, significant weight change, fatigue  Eyes: No redness, discharge, pain, vision change ENT/mouth: No nasal congestion,  purulent discharge, earache, change in hearing, sore throat  Cardiovascular: No chest pain, palpitations, paroxysmal nocturnal dyspnea,  edema  Respiratory: No cough, sputum production, hemoptysis, significant snoring, apnea   Gastrointestinal: No heartburn, dysphagia, nausea /vomiting, rectal bleeding, melena @ present, change in bowels Genitourinary: No dysuria, hematuria, pyuria, incontinence, nocturia Dermatologic: No rash, pruritus, change in appearance of skin Neurologic: No dizziness, headache, syncope, seizures, numbness, tingling Psychiatric: No significant anxiety, depression, insomnia, anorexia Endocrine: No change in hair/skin/nails, excessive thirst, excessive  hunger, excessive urination  Hematologic/lymphatic: No significant bruising, lymphadenopathy, abnormal bleeding Allergy/immunology: No itchy/watery eyes, significant sneezing, urticaria, angioedema  Physical exam:  Pertinent or positive findings: She appears her stated age.  Initially she was sleeping but could be aroused.  She remained somewhat lethargic.  She is hard of hearing despite wearing hearing aids bilaterally.  Lacrimal glands are very prominent.  The right nasolabial fold was slightly decreased.  She has a grade 1 systolic murmur with splitting and slight accentuation of the second heart sound.  Abdomen is protuberant.  Nonpitting edema is visible over the lower extremities.  Pedal pulses are decreased.  She has marked arthritic changes of the hands and especially the right knee with dramatic fusiform enlargement.  Interosseous wasting is present.  General appearance: Adequately nourished; no acute distress, increased work of breathing is present.   Lymphatic: No lymphadenopathy about the head, neck, axilla. Eyes: No conjunctival inflammation or lid edema is present. There is no scleral icterus. Ears:  External ear exam shows no significant lesions or deformities.   Nose:  External nasal examination shows no deformity or inflammation. Nasal mucosa are pink and moist without lesions, exudates Oral exam:  Lips and gums are healthy appearing. There is no oropharyngeal erythema or exudate. Neck:  No thyromegaly, masses, tenderness noted.    Heart:  Normal rate and regular rhythm. S1 normal without gallop, click, rub .  Lungs:  without wheezes, rhonchi, rales, rubs. Abdomen: Bowel sounds are normal. Abdomen is soft and nontender with no organomegaly, hernias, masses. GU: Deferred  Extremities:  No cyanosis, clubbing  Neurologic exam :Balance, [place], finger to nose testing could not be completed due to clinical state Skin: Warm & dry w/o tenting. No significant lesions or rash.  See  summary under each active problem  in the Problem List with associated updated therapeutic plan

## 2021-03-16 NOTE — Assessment & Plan Note (Signed)
No exacerbation present; she remains on O2 at 2 L overnight.  Exertional dyspnea requiring O2 with mobilization is not occurring as her crippling arthritis prevents such.

## 2021-03-16 NOTE — Assessment & Plan Note (Addendum)
Clinically her rhythm was regular today. Last EKG 10/22/2017 revealed paced rhythm. She does have a systolic murmur and splitting & slight accentuation of the second heart sound . No change in regimen indicated clinically.

## 2021-03-16 NOTE — Patient Instructions (Signed)
See assessment and plan under each diagnosis in the problem list and acutely for this visit 

## 2021-03-16 NOTE — Assessment & Plan Note (Addendum)
Most recent labs were 10/20/2020 and revealed CKD stage IIIa with a creatinine of 0.9 and GFR 57. Avoid nephrotoxic drugs.

## 2021-03-16 NOTE — Assessment & Plan Note (Signed)
10/20/2020 total protein was 6.4 and albumin low normal at 3.6.  Albumin previously had been 3.3. Nutritionist @ SNF to monitor.

## 2021-03-22 ENCOUNTER — Non-Acute Institutional Stay (SKILLED_NURSING_FACILITY): Payer: Medicare Other | Admitting: Adult Health

## 2021-03-22 ENCOUNTER — Encounter: Payer: Self-pay | Admitting: Adult Health

## 2021-03-22 DIAGNOSIS — L03032 Cellulitis of left toe: Secondary | ICD-10-CM

## 2021-03-22 NOTE — Progress Notes (Signed)
Location:  [facility] Room Number: 417 Place of Service:  SNF (31)   CODE STATUS: dnr  Allergies  Allergen Reactions  . Elemental Sulfur Swelling and Rash    Chief Complaint  Patient presents with  . Acute Visit     Left fourth toe     HPI:  Her left fourth toe has a small ulceration present. The area is red hot and painful to touch. There is no drainage present. There are no reports of fevers present.   Past Medical History:  Diagnosis Date  . Atrial fibrillation ([place])   . Bradycardia   . Breast nodule 11/15/2014  . Breast pain, left 11/15/2014  . Carotid artery disease ([place])   . Dizziness   . Dyspnea    Previous CPX suggesting possible restrictive physiology, respiratory muscle fatigue, diastolic dysfunction  . Essential hypertension, benign   . Hyperlipidemia   . Oxygen dependent    2 liter  . Pacemaker [place]   . Rib pain on left side 11/15/2014  . Seasonal allergies   . Shingles 05/04/2015  . Stroke ([place])   . Toe fracture, left    left big toe    Past Surgical History:  Procedure Laterality Date  . COLONOSCOPY  02/22/2012   Procedure: COLONOSCOPY;  Surgeon: Rogene Houston, MD;  Location: [facility];  Service: Endoscopy;  Laterality: N/A;  100  . KNEE SURGERY     bilateral  . PACEMAKER IMPLANT N/A 07/17/2017   Procedure: Pacemaker Implant;  Surgeon: Deboraha Sprang, MD;  Location: [facility];  Service: Cardiovascular;  Laterality: N/A;    Social History   Socioeconomic History  . Marital status: Widowed    Spouse name: Not on file  . Number of children: Not on file  . Years of education: Not on file  . Highest education level: Not on file  Occupational History  . Not on file  Tobacco Use  . Smoking status: Never Smoker  . Smokeless tobacco: Never Used  Vaping Use  . Vaping Use: Never used  Substance and Sexual Activity  . Alcohol use: No  . Drug use: No  . Sexual activity: Never    Birth control/protection:  Post-menopausal  Other Topics Concern  . Not on file  Social History Narrative  . Not on file   Social Determinants of Health   Financial Resource Strain: Not on file  Food Insecurity: Not on file  Transportation Needs: Not on file  Physical Activity: Not on file  Stress: Not on file  Social Connections: Not on file  Intimate Partner Violence: Not on file   Family History  Problem Relation Age of Onset  . Stroke Mother   . Pneumonia Father   . Heart disease Father   . Healthy Son   . Glaucoma Daughter   . Emphysema Daughter   . Healthy Daughter   . Other Daughter        had knee replacement  . Healthy Son   . Heart disease Maternal Grandmother       VITAL SIGNS BP (!) 177/60   Pulse 60   Resp 18   Ht 5\' 4"  (1.626 m)   Wt 139 lb (63 kg)   SpO2 96%   BMI 23.86 kg/m   Outpatient Encounter Medications as of 03/22/2021  Medication Sig  . acetaminophen (TYLENOL) 500 MG tablet Take 1,000 mg by mouth every 8 (eight) hours as needed. (Patient not taking: Reported  on 03/17/2021)  . acetaminophen (TYLENOL) 650 MG CR tablet Take 650 mg by mouth every 8 (eight) hours.   Marland Kitchen albuterol (PROVENTIL HFA;VENTOLIN HFA) 108 (90 Base) MCG/ACT inhaler Inhale 2 puffs into the lungs at bedtime. For SOB or wheezing  . alum & mag hydroxide-simeth (MAALOX PLUS) 400-400-40 MG/5ML suspension Take 30 mLs by mouth every 8 (eight) hours as needed for indigestion.  Jearl Klinefelter ELLIPTA 62.5-25 MCG/INH AEPB Inhale 1 puff into the lungs daily.   Marland Kitchen apixaban (ELIQUIS) 2.5 MG TABS tablet Take 1 tablet (2.5 mg total) by mouth 2 (two) times daily. Resume 07/20/17  . artificial tears (LACRILUBE) OINT ophthalmic ointment Place 1 application into both eyes at bedtime.  Marland Kitchen artificial tears (LACRILUBE) OINT ophthalmic ointment Place 1 application into both eyes daily as needed for dry eyes.  . cycloSPORINE (RESTASIS) 0.05 % ophthalmic emulsion Place 1 drop into both eyes every 12 (twelve) hours.  Marland Kitchen diltiazem (CARDIZEM  CD) 180 MG 24 hr capsule TAKE ONE CAPSULE BY MOUTH ONCE DAILY.  . furosemide (LASIX) 20 MG tablet Take 20 mg by mouth 2 (two) times daily.   Marland Kitchen gabapentin (NEURONTIN) 100 MG capsule Take 200 mg by mouth 2 (two) times daily. Bilateral Lower Extremity Neuropathic Pain  . hydrocortisone (ANUSOL-HC) 2.5 % rectal cream Place 1 application rectally 2 (two) times daily as needed. Apply topically for hemorrhoids as directed  . Melatonin 5 MG TABS Take 5 mg by mouth at bedtime as needed.   . Menthol, Topical Analgesic, (BIOFREEZE) 4 % GEL Apply 1 application topically in the morning, at noon, and at bedtime. Apply topically to left shoulder, left knee and neck for M/S pain  . NON FORMULARY Diet Type:  Regular  . Nutritional Supplements (ENSURE ENLIVE PO) Take 237 mLs by mouth daily.   . OXYGEN Inhale 2 L into the lungs at bedtime.   . pantoprazole (PROTONIX) 40 MG tablet Take 40 mg by mouth 2 (two) times daily.  Vladimir Faster Glycol-Propyl Glycol (SYSTANE OP) Place 1 drop into both eyes every 4 (four) hours. For dry eyes  Administer 15 minutes after administering Restasis eye drops.  . polyethylene glycol (MIRALAX / GLYCOLAX) packet Take 17 g by mouth daily as needed. For constipation  . traMADol (ULTRAM) 50 MG tablet Take 0.5 tablets (25 mg total) by mouth every 8 (eight) hours.  . [DISCONTINUED] Albuterol (VENTOLIN IN) Inhale into the lungs.     No facility-administered encounter medications on file as of 03/22/2021.     SIGNIFICANT DIAGNOSTIC EXAMS   PREVIOUS  05-09-20: KUB normal KUB  05-09-20: thoracic spine x-ray:Multilevel degenerative disc disease with dextroscoliosis.  05-09-20: lumbar spine x-ray:  Multilevel degenerative disc disease with no acute fracture.  NO NEW EXAMS.     LABS REVIEWED: PREVIOUS    04-25-20: wbc 5.5; hgb 12.3; hct 40.2 mcv 83.8 plt 219; glucose 96; bun 26; creat 0.94; k+ 4.2; na++ 138; ca 8.8 liver normal albumin 3.3   09-14-20: wbc 5.7; hgb 13.1; hct 42.3; mcv 84.9  plt 201  10-20-20: glucose 68; bun 22; creat 0.90; k+ 3.9; na++ 138; ca 8.7 liver normal albumin 3.6   NO NEW LABS .    Review of Systems  Constitutional: Negative for malaise/fatigue.  Respiratory: Negative for cough and shortness of breath.   Cardiovascular: Negative for chest pain, palpitations and leg swelling.  Gastrointestinal: Negative for abdominal pain, constipation and heartburn.  Musculoskeletal: Negative for back pain, joint pain and myalgias.  Skin:  Sore on left toe  Neurological: Negative for dizziness.  Psychiatric/Behavioral: The patient is not nervous/anxious.      Physical Exam Constitutional:      General: She is not in acute distress.    Appearance: She is well-developed. She is not diaphoretic.  Neck:     Thyroid: No thyromegaly.  Cardiovascular:     Rate and Rhythm: Normal rate and regular rhythm.     Pulses: Normal pulses.     Heart sounds: Normal heart sounds.  Pulmonary:     Effort: Pulmonary effort is normal. No respiratory distress.     Breath sounds: Normal breath sounds.  Abdominal:     General: Bowel sounds are normal. There is no distension.     Palpations: Abdomen is soft.     Tenderness: There is no abdominal tenderness.  Musculoskeletal:        General: Normal range of motion.     Cervical back: Neck supple.     Right lower leg: Edema present.     Left lower leg: Edema present.     Comments: 2-3+ bilateral lower extremity edema   Lymphadenopathy:     Cervical: No cervical adenopathy.  Skin:    General: Skin is warm and dry.     Comments: Left 4th toe: red; hot; tender to touch   Neurological:     Mental Status: She is alert and oriented to person, place, and time.  Psychiatric:        Mood and Affect: Mood normal.      ASSESSMENT/ PLAN:  TODAY  1. Cellulitis fourth toe left: is worse will begin doxycycline 100 mg twice daily through 03-31-21     Ok Edwards NP [facility]  Contact [phone] Monday  through Friday 8am- 5pm  After hours call [phone]

## 2021-03-27 ENCOUNTER — Ambulatory Visit (INDEPENDENT_AMBULATORY_CARE_PROVIDER_SITE_OTHER): Payer: Medicare Other | Admitting: Podiatry

## 2021-03-27 DIAGNOSIS — L84 Corns and callosities: Secondary | ICD-10-CM | POA: Diagnosis not present

## 2021-03-27 DIAGNOSIS — S91105A Unspecified open wound of left lesser toe(s) without damage to nail, initial encounter: Secondary | ICD-10-CM

## 2021-03-27 NOTE — Progress Notes (Signed)
   HPI: 87 y.o. female presenting today as a new patient with her son for evaluation of a wound that developed to the fourth toe left foot.  Patient states that she is currently on oral doxycycline that was prescribed by different physician for the toe wound.  She also has a symptomatic callus to the inside of the great toe left foot.  She presents for further treatment and evaluation  Past Medical History:  Diagnosis Date  . Atrial fibrillation ([place])   . Bradycardia   . Breast nodule 11/15/2014  . Breast pain, left 11/15/2014  . Carotid artery disease ([place])   . Dizziness   . Dyspnea    Previous CPX suggesting possible restrictive physiology, respiratory muscle fatigue, diastolic dysfunction  . Essential hypertension, benign   . Hyperlipidemia   . Oxygen dependent    2 liter  . Pacemaker [place]   . Rib pain on left side 11/15/2014  . Seasonal allergies   . Shingles 05/04/2015  . Stroke ([place])   . Toe fracture, left    left big toe     Physical Exam: General: The patient is alert and oriented x3 in no acute distress.  Dermatology: Skin is warm, dry and supple bilateral lower extremities. Negative for open lesions or macerations with exception of a superficial wound to the left fourth toe limited to breakdown of skin.  There is some associated erythema around the toe as well.  Vascular: Palpable pedal pulses bilaterally. No edema of noted. Capillary refill within normal limits.  Neurological: Epicritic and protective threshold grossly intact bilaterally.   Musculoskeletal Exam: Range of motion within normal limits to all pedal and ankle joints bilateral. Muscle strength 5/5 in all groups bilateral.    Assessment: 1. Pre-ulcerative callus medial 1st MTPJ left 2. Open superficial wound limited to breakdown of skin left 4th toe.   Plan of Care:  1. Patient evaluated. 2.  Light debridement of the preulcerative callus was performed without incident or bleeding using a tissue  nipper.  Patient felt immediate relief 3.  In regards to the superficial wound to the left fourth toe, recommend antibiotic ointment and a Band-Aid daily 4.  Continue oral doxycycline as prescribed by prescribing physician 5.  Return to clinic as needed      Edrick Kins, DPM [facility]  Dr. Edrick Kins, DPM    [address], [place] [address]                Office [phone]  Fax [phone]

## 2021-04-05 DIAGNOSIS — H35373 Puckering of macula, bilateral: Secondary | ICD-10-CM | POA: Diagnosis not present

## 2021-04-05 DIAGNOSIS — H35032 Hypertensive retinopathy, left eye: Secondary | ICD-10-CM | POA: Diagnosis not present

## 2021-04-05 DIAGNOSIS — H353122 Nonexudative age-related macular degeneration, left eye, intermediate dry stage: Secondary | ICD-10-CM | POA: Diagnosis not present

## 2021-04-05 DIAGNOSIS — Z961 Presence of intraocular lens: Secondary | ICD-10-CM | POA: Diagnosis not present

## 2021-04-05 DIAGNOSIS — H35362 Drusen (degenerative) of macula, left eye: Secondary | ICD-10-CM | POA: Diagnosis not present

## 2021-04-05 DIAGNOSIS — H353114 Nonexudative age-related macular degeneration, right eye, advanced atrophic with subfoveal involvement: Secondary | ICD-10-CM | POA: Diagnosis not present

## 2021-04-07 ENCOUNTER — Other Ambulatory Visit: Payer: Self-pay | Admitting: Adult Health

## 2021-04-07 MED ORDER — TRAMADOL HCL 50 MG PO TABS: 25.0000 mg | ORAL_TABLET | Freq: Three times a day (TID) | ORAL | 0 refills | Status: DC

## 2021-04-14 ENCOUNTER — Non-Acute Institutional Stay (SKILLED_NURSING_FACILITY): Payer: Medicare Other | Admitting: Adult Health

## 2021-04-14 ENCOUNTER — Encounter: Payer: Self-pay | Admitting: Adult Health

## 2021-04-14 DIAGNOSIS — E785 Hyperlipidemia, unspecified: Secondary | ICD-10-CM | POA: Diagnosis not present

## 2021-04-14 DIAGNOSIS — K219 Gastro-esophageal reflux disease without esophagitis: Secondary | ICD-10-CM | POA: Diagnosis not present

## 2021-04-14 DIAGNOSIS — I442 Atrioventricular block, complete: Secondary | ICD-10-CM

## 2021-04-14 NOTE — Progress Notes (Signed)
Location:  [facility] Room Number: 108 Place of Service:  SNF (31)   CODE STATUS: dnr  Allergies  Allergen Reactions  . Elemental Sulfur Swelling and Rash    Chief Complaint  Patient presents with  . Medical Management of Chronic Issues          Complete heart block    GERD without esophagitis:  dysipidemia     HPI:  She is a 87 year old term resident of this facility being seen for the management of her chronic illnesses:  Complete heart block    GERD without esophagitis:  dysipidemia . There are no reports of uncontrolled pain; no changes in appetite; no heart burn.  Past Medical History:  Diagnosis Date  . Atrial fibrillation ([place])   . Bradycardia   . Breast nodule 11/15/2014  . Breast pain, left 11/15/2014  . Carotid artery disease ([place])   . Dizziness   . Dyspnea    Previous CPX suggesting possible restrictive physiology, respiratory muscle fatigue, diastolic dysfunction  . Essential hypertension, benign   . Hyperlipidemia   . Oxygen dependent    2 liter  . Pacemaker [place]   . Rib pain on left side 11/15/2014  . Seasonal allergies   . Shingles 05/04/2015  . Stroke ([place])   . Toe fracture, left    left big toe    Past Surgical History:  Procedure Laterality Date  . COLONOSCOPY  02/22/2012   Procedure: COLONOSCOPY;  Surgeon: Rogene Houston, MD;  Location[facility];  Service: Endoscopy;  Laterality: N/A;  100  . KNEE SURGERY     bilateral  . PACEMAKER IMPLANT N/A 07/17/2017   Procedure: Pacemaker Implant;  Surgeon: Deboraha Sprang, MD;  Location: [facility];  Service: Cardiovascular;  Laterality: N/A;    Social History   Socioeconomic History  . Marital status: Widowed    Spouse name: Not on file  . Number of children: Not on file  . Years of education: Not on file  . Highest education level: Not on file  Occupational History  . Not on file  Tobacco Use  . Smoking status: Never Smoker  . Smokeless tobacco: Never  Used  Vaping Use  . Vaping Use: Never used  Substance and Sexual Activity  . Alcohol use: No  . Drug use: No  . Sexual activity: Never    Birth control/protection: Post-menopausal  Other Topics Concern  . Not on file  Social History Narrative  . Not on file   Social Determinants of Health   Financial Resource Strain: Not on file  Food Insecurity: Not on file  Transportation Needs: Not on file  Physical Activity: Not on file  Stress: Not on file  Social Connections: Not on file  Intimate Partner Violence: Not on file   Family History  Problem Relation Age of Onset  . Stroke Mother   . Pneumonia Father   . Heart disease Father   . Healthy Son   . Glaucoma Daughter   . Emphysema Daughter   . Healthy Daughter   . Other Daughter        had knee replacement  . Healthy Son   . Heart disease Maternal Grandmother       VITAL SIGNS BP 130/80   Pulse 74   Temp 98.3 F (36.8 C)   Resp 18   Ht 5\' 4"  (1.626 m)   Wt 145 lb 12.8 oz (66.1 kg)  BMI 25.03 kg/m   Outpatient Encounter Medications as of 04/14/2021  Medication Sig  . acetaminophen (TYLENOL) 500 MG tablet Take 1,000 mg by mouth every 8 (eight) hours as needed. (Patient not taking: Reported on 03/17/2021)  . acetaminophen (TYLENOL) 650 MG CR tablet Take 650 mg by mouth every 8 (eight) hours.   Marland Kitchen albuterol (PROVENTIL HFA;VENTOLIN HFA) 108 (90 Base) MCG/ACT inhaler Inhale 2 puffs into the lungs at bedtime. For SOB or wheezing  . alum & mag hydroxide-simeth (MAALOX PLUS) 400-400-40 MG/5ML suspension Take 30 mLs by mouth every 8 (eight) hours as needed for indigestion.  Jearl Klinefelter ELLIPTA 62.5-25 MCG/INH AEPB Inhale 1 puff into the lungs daily.   Marland Kitchen apixaban (ELIQUIS) 2.5 MG TABS tablet Take 1 tablet (2.5 mg total) by mouth 2 (two) times daily. Resume 07/20/17  . artificial tears (LACRILUBE) OINT ophthalmic ointment Place 1 application into both eyes at bedtime.  Marland Kitchen artificial tears (LACRILUBE) OINT ophthalmic ointment Place 1  application into both eyes daily as needed for dry eyes.  . cycloSPORINE (RESTASIS) 0.05 % ophthalmic emulsion Place 1 drop into both eyes every 12 (twelve) hours.  Marland Kitchen diltiazem (CARDIZEM CD) 180 MG 24 hr capsule TAKE ONE CAPSULE BY MOUTH ONCE DAILY.  Marland Kitchen doxycycline (VIBRA-TABS) 100 MG tablet Take 100 mg by mouth 2 (two) times daily.  . furosemide (LASIX) 20 MG tablet Take 20 mg by mouth 2 (two) times daily.   Marland Kitchen gabapentin (NEURONTIN) 100 MG capsule Take 200 mg by mouth 2 (two) times daily. Bilateral Lower Extremity Neuropathic Pain  . hydrocortisone (ANUSOL-HC) 2.5 % rectal cream Place 1 application rectally 2 (two) times daily as needed. Apply topically for hemorrhoids as directed  . Melatonin 5 MG TABS Take 5 mg by mouth at bedtime as needed.   . Menthol, Topical Analgesic, (BIOFREEZE) 4 % GEL Apply 1 application topically in the morning, at noon, and at bedtime. Apply topically to left shoulder, left knee and neck for M/S pain  . NON FORMULARY Diet Type:  Regular  . Nutritional Supplements (ENSURE ENLIVE PO) Take 237 mLs by mouth daily.   . OXYGEN Inhale 2 L into the lungs at bedtime.   . pantoprazole (PROTONIX) 40 MG tablet Take 40 mg by mouth 2 (two) times daily.  Vladimir Faster Glycol-Propyl Glycol (SYSTANE OP) Place 1 drop into both eyes every 4 (four) hours. For dry eyes  Administer 15 minutes after administering Restasis eye drops.  . polyethylene glycol (MIRALAX / GLYCOLAX) packet Take 17 g by mouth daily as needed. For constipation  . traMADol (ULTRAM) 50 MG tablet Take 0.5 tablets (25 mg total) by mouth every 8 (eight) hours.  . [DISCONTINUED] Albuterol (VENTOLIN IN) Inhale into the lungs.     No facility-administered encounter medications on file as of 04/14/2021.     SIGNIFICANT DIAGNOSTIC EXAMS   PREVIOUS  05-09-20: KUB normal KUB  05-09-20: thoracic spine x-ray:Multilevel degenerative disc disease with dextroscoliosis.  05-09-20: lumbar spine x-ray:  Multilevel degenerative disc  disease with no acute fracture.  NO NEW EXAMS.     LABS REVIEWED: PREVIOUS    04-25-20: wbc 5.5; hgb 12.3; hct 40.2 mcv 83.8 plt 219; glucose 96; bun 26; creat 0.94; k+ 4.2; na++ 138; ca 8.8 liver normal albumin 3.3   09-14-20: wbc 5.7; hgb 13.1; hct 42.3; mcv 84.9 plt 201  10-20-20: glucose 68; bun 22; creat 0.90; k+ 3.9; na++ 138; ca 8.7 liver normal albumin 3.6   NO NEW LABS .   Review of  Systems  Constitutional: Negative for malaise/fatigue.  Respiratory: Negative for cough and shortness of breath.   Cardiovascular: Negative for chest pain, palpitations and leg swelling.  Gastrointestinal: Negative for abdominal pain, constipation and heartburn.  Musculoskeletal: Positive for joint pain. Negative for back pain and myalgias.       Has chronic joint pain   Skin: Negative.   Neurological: Negative for dizziness.  Psychiatric/Behavioral: The patient is not nervous/anxious.       Physical Exam Constitutional:      General: She is not in acute distress.    Appearance: She is well-developed. She is not diaphoretic.  Neck:     Thyroid: No thyromegaly.  Cardiovascular:     Rate and Rhythm: Normal rate and regular rhythm.     Pulses: Normal pulses.     Heart sounds: Normal heart sounds.  Pulmonary:     Effort: Pulmonary effort is normal. No respiratory distress.     Breath sounds: Normal breath sounds.  Abdominal:     General: Bowel sounds are normal. There is no distension.     Palpations: Abdomen is soft.     Tenderness: There is no abdominal tenderness.  Musculoskeletal:        General: Normal range of motion.     Cervical back: Neck supple.     Right lower leg: Edema present.     Left lower leg: Edema present.     Comments: 2-3+ bilateral lower extremity edema    Lymphadenopathy:     Cervical: No cervical adenopathy.  Skin:    General: Skin is warm and dry.  Neurological:     Mental Status: She is alert and oriented to person, place, and time.  Psychiatric:         Mood and Affect: Mood normal.      ASSESSMENT/ PLAN:  TODAY;   1. Complete heart block is status post [employer] PTV/DP Psychologist, forensic; will monitor   2. GERD without esophagitis: is stable will continue protonix 40 mg daily   3. dysipidemia is stable is off lipitor   PREVIOUS   4. Chronic constipation: is stable will continue miralax daily as needed.   5. Chronic diastolic heart failure EF 60-65% (06-21-17) is stable will continue lasix twice daily   6. Longstanding persistent atrial fibrillation: is status post pace maker; will continue cardizem cd 180 mg daily for rate control and eliquis 2.5 mg twice daily   7. Hypertensive heart and kidney disease with chronic diastolic congestive heart failure with stage 3 chronic kidney disease is stable b/p 156/75 will continue cardizem cd 180 mg daily is off norvasc   8. Aortic atherosclerosis: is stable will monitor   9. Chronic obstructive pulmonary disease: is stable 02 dependent will continue anoro 62.5/25 mcg 1 puff daily and albuterol 2 puffs every hs  10. Chronic kidney disease stage 3 (moderate) is stable bun 26; creat 0.94 will monitor  11.  Peripheral neuropathy: is stable  will continue  gabapentin to 200 mg twice daily   12. GERD without esophagitis: is stable will continue protonix 40 mg daily   13. Chronic generalized pain: is stable will continue tylenol cr 650 mg every 6 hours with ultram 25 mg three times daily   14. CVA (cerebrovascular accident) is stable will continue eliquis 2.5 mg twice daily    Will check cbc; cmp   Ok Edwards NP [facility]  Contact [phone] Monday through Friday 8am- 5pm  After hours call [phone]

## 2021-04-18 ENCOUNTER — Ambulatory Visit (INDEPENDENT_AMBULATORY_CARE_PROVIDER_SITE_OTHER): Payer: Medicare Other

## 2021-04-18 DIAGNOSIS — I442 Atrioventricular block, complete: Secondary | ICD-10-CM | POA: Diagnosis not present

## 2021-04-20 LAB — CUP PACEART REMOTE DEVICE CHECK
Battery Remaining Longevity: 145 mo
Battery Remaining Percentage: 95.5 %
Battery Voltage: 3.01 V
Brady Statistic RV Percent Paced: 99 %
Date Time Interrogation Session: 20220510020014
Implantable Lead Implant Date: 20180808
Implantable Lead Location: 753860
Implantable Lead Model: 1948
Implantable Pulse Generator Implant Date: 20180808
Lead Channel Impedance Value: 550 Ohm
Lead Channel Pacing Threshold Amplitude: 0.5 V
Lead Channel Pacing Threshold Pulse Width: 0.5 ms
Lead Channel Sensing Intrinsic Amplitude: 7.2 mV
Lead Channel Setting Pacing Amplitude: 0.75 V
Lead Channel Setting Pacing Pulse Width: 0.5 ms
Lead Channel Setting Sensing Sensitivity: 0.5 mV
Pulse Gen Model: 1272
Pulse Gen Serial Number: 7957820

## 2021-04-26 ENCOUNTER — Encounter: Payer: Medicare Other | Admitting: Internal Medicine

## 2021-05-01 ENCOUNTER — Other Ambulatory Visit: Payer: Self-pay | Admitting: Adult Health

## 2021-05-01 MED ORDER — TRAMADOL HCL 50 MG PO TABS: 25.0000 mg | ORAL_TABLET | Freq: Three times a day (TID) | ORAL | 0 refills | Status: DC

## 2021-05-10 NOTE — Progress Notes (Signed)
Remote pacemaker transmission.   

## 2021-05-11 ENCOUNTER — Encounter: Payer: Self-pay | Admitting: Adult Health

## 2021-05-11 ENCOUNTER — Non-Acute Institutional Stay (SKILLED_NURSING_FACILITY): Payer: Medicare Other | Admitting: Adult Health

## 2021-05-11 DIAGNOSIS — I4811 Longstanding persistent atrial fibrillation: Secondary | ICD-10-CM | POA: Diagnosis not present

## 2021-05-11 DIAGNOSIS — I5032 Chronic diastolic (congestive) heart failure: Secondary | ICD-10-CM

## 2021-05-11 DIAGNOSIS — K5909 Other constipation: Secondary | ICD-10-CM | POA: Diagnosis not present

## 2021-05-11 DIAGNOSIS — N1832 Chronic kidney disease, stage 3b: Secondary | ICD-10-CM

## 2021-05-11 DIAGNOSIS — I130 Hypertensive heart and chronic kidney disease with heart failure and stage 1 through stage 4 chronic kidney disease, or unspecified chronic kidney disease: Secondary | ICD-10-CM

## 2021-05-11 NOTE — Progress Notes (Signed)
Location:  [place] Room Number: 108-W Place of Service:  SNF (31)   CODE STATUS: DNR  Allergies  Allergen Reactions  . Elemental Sulfur Swelling and Rash    Chief Complaint  Patient presents with  . Medical Management of Chronic Issues            Chronic constipation:     Chronic diastolic heart failure:   . Long standing persistent atrial fibrillation:     Hypertensive heart and kidney disease with chronic diastolic congestive heart failure and stage 3 chronic kidney disease     HPI:  She is a 87 year old long term resident of this facility being seen for the management of her chronic illnesses:Chronic constipation:     Chronic diastolic heart failure:   . Long standing persistent atrial fibrillation:     Hypertensive heart and kidney disease with chronic diastolic congestive heart failure and stage 3 chronic kidney disease. There are no reports of uncontrolled pain; no changes in appetite; no constipation; no anxiety.    Past Medical History:  Diagnosis Date  . Atrial fibrillation ([place])   . Bradycardia   . Breast nodule 11/15/2014  . Breast pain, left 11/15/2014  . Carotid artery disease ([place])   . Dizziness   . Dyspnea    Previous CPX suggesting possible restrictive physiology, respiratory muscle fatigue, diastolic dysfunction  . Essential hypertension, benign   . Hyperlipidemia   . Oxygen dependent    2 liter  . Pacemaker [place]   . Rib pain on left side 11/15/2014  . Seasonal allergies   . Shingles 05/04/2015  . Stroke ([place])   . Toe fracture, left    left big toe    Past Surgical History:  Procedure Laterality Date  . COLONOSCOPY  02/22/2012   Procedure: COLONOSCOPY;  Surgeon: Rogene Houston, MD;  [facility];  Service: Endoscopy;  Laterality: N/A;  100  . KNEE SURGERY     bilateral  . PACEMAKER IMPLANT N/A 07/17/2017   Procedure: Pacemaker Implant;  Surgeon: Deboraha Sprang, MD;  Location: [facility];  Service:  Cardiovascular;  Laterality: N/A;    Social History   Socioeconomic History  . Marital status: Widowed    Spouse name: Not on file  . Number of children: Not on file  . Years of education: Not on file  . Highest education level: Not on file  Occupational History  . Not on file  Tobacco Use  . Smoking status: Never Smoker  . Smokeless tobacco: Never Used  Vaping Use  . Vaping Use: Never used  Substance and Sexual Activity  . Alcohol use: No  . Drug use: No  . Sexual activity: Never    Birth control/protection: Post-menopausal  Other Topics Concern  . Not on file  Social History Narrative  . Not on file   Social Determinants of Health   Financial Resource Strain: Not on file  Food Insecurity: Not on file  Transportation Needs: Not on file  Physical Activity: Not on file  Stress: Not on file  Social Connections: Not on file  Intimate Partner Violence: Not on file   Family History  Problem Relation Age of Onset  . Stroke Mother   . Pneumonia Father   . Heart disease Father   . Healthy Son   . Glaucoma Daughter   . Emphysema Daughter   . Healthy Daughter   . Other Daughter  had knee replacement  . Healthy Son   . Heart disease Maternal Grandmother       VITAL SIGNS BP (!) 156/72   Pulse 64   Temp (!) 97.2 F (36.2 C)   Resp 20   Ht 5\' 4"  (1.626 m)   Wt 143 lb 12.8 oz (65.2 kg)   SpO2 94%   BMI 24.68 kg/m   Outpatient Encounter Medications as of 05/11/2021  Medication Sig  . acetaminophen (TYLENOL) 650 MG CR tablet Take 650 mg by mouth every 8 (eight) hours.   Marland Kitchen albuterol (PROVENTIL HFA;VENTOLIN HFA) 108 (90 Base) MCG/ACT inhaler Inhale 2 puffs into the lungs at bedtime. For SOB or wheezing  . alum & mag hydroxide-simeth (MAALOX PLUS) 400-400-40 MG/5ML suspension Take 30 mLs by mouth every 8 (eight) hours as needed for indigestion.  Jearl Klinefelter ELLIPTA 62.5-25 MCG/INH AEPB Inhale 1 puff into the lungs daily.   Marland Kitchen apixaban (ELIQUIS) 2.5 MG TABS tablet  Take 1 tablet (2.5 mg total) by mouth 2 (two) times daily. Resume 07/20/17  . artificial tears (LACRILUBE) OINT ophthalmic ointment Place 1 application into both eyes at bedtime.  Marland Kitchen artificial tears (LACRILUBE) OINT ophthalmic ointment Place 1 application into both eyes daily as needed for dry eyes.  . cycloSPORINE (RESTASIS) 0.05 % ophthalmic emulsion Place 1 drop into both eyes every 12 (twelve) hours.  Marland Kitchen diltiazem (CARDIZEM CD) 180 MG 24 hr capsule TAKE ONE CAPSULE BY MOUTH ONCE DAILY.  . furosemide (LASIX) 20 MG tablet Take 20 mg by mouth 2 (two) times daily.   Marland Kitchen gabapentin (NEURONTIN) 100 MG capsule Take 200 mg by mouth 2 (two) times daily. Bilateral Lower Extremity Neuropathic Pain  . hydrocortisone (ANUSOL-HC) 2.5 % rectal cream Place 1 application rectally 2 (two) times daily as needed. Apply topically for hemorrhoids as directed  . Melatonin 5 MG TABS Take 5 mg by mouth at bedtime as needed.   . Menthol, Topical Analgesic, (BIOFREEZE) 4 % GEL Apply 1 application topically in the morning, at noon, and at bedtime. Apply topically to left shoulder, left knee and neck for M/S pain  . NON FORMULARY Diet Type:  Regular  . OXYGEN Inhale 2 L into the lungs at bedtime.   . pantoprazole (PROTONIX) 40 MG tablet Take 40 mg by mouth 2 (two) times daily.  Vladimir Faster Glycol-Propyl Glycol (SYSTANE OP) Place 1 drop into both eyes every 4 (four) hours. For dry eyes  Administer 15 minutes after administering Restasis eye drops.  . polyethylene glycol (MIRALAX / GLYCOLAX) packet Take 17 g by mouth daily as needed. For constipation  . traMADol (ULTRAM) 50 MG tablet Take 0.5 tablets (25 mg total) by mouth every 8 (eight) hours.  . Nutritional Supplements (ENSURE ENLIVE PO) Take 237 mLs by mouth daily.  (Patient not taking: Reported on 05/11/2021)  . [DISCONTINUED] acetaminophen (TYLENOL) 500 MG tablet Take 1,000 mg by mouth every 8 (eight) hours as needed. (Patient not taking: Reported on 03/17/2021)  .  [DISCONTINUED] Albuterol (VENTOLIN IN) Inhale into the lungs.    . [DISCONTINUED] doxycycline (VIBRA-TABS) 100 MG tablet Take 100 mg by mouth 2 (two) times daily.   No facility-administered encounter medications on file as of 05/11/2021.     SIGNIFICANT DIAGNOSTIC EXAMS   NO NEW EXAMS.     LABS REVIEWED: PREVIOUS    09-14-20: wbc 5.7; hgb 13.1; hct 42.3; mcv 84.9 plt 201  10-20-20: glucose 68; bun 22; creat 0.90; k+ 3.9; na++ 138; ca 8.7 liver normal albumin  3.6   NO NEW LABS .   Review of Systems  Constitutional: Negative for malaise/fatigue.  Respiratory: Negative for cough and shortness of breath.   Cardiovascular: Negative for chest pain, palpitations and leg swelling.  Gastrointestinal: Negative for abdominal pain, constipation and heartburn.  Musculoskeletal: Positive for joint pain. Negative for back pain and myalgias.       Has chronic joint pain   Skin: Negative.   Neurological: Negative for dizziness.  Psychiatric/Behavioral: The patient is not nervous/anxious.       Physical Exam Constitutional:      General: She is not in acute distress.    Appearance: She is well-developed. She is not diaphoretic.  Neck:     Thyroid: No thyromegaly.  Cardiovascular:     Rate and Rhythm: Normal rate and regular rhythm.     Pulses: Normal pulses.     Heart sounds: Normal heart sounds.  Pulmonary:     Effort: Pulmonary effort is normal. No respiratory distress.     Breath sounds: Normal breath sounds.  Abdominal:     General: Bowel sounds are normal. There is no distension.     Palpations: Abdomen is soft.     Tenderness: There is no abdominal tenderness.  Musculoskeletal:        General: Normal range of motion.     Cervical back: Neck supple.     Right lower leg: Edema present.     Left lower leg: Edema present.     Comments: 2-3+ bilateral lower extremity edema.   Lymphadenopathy:     Cervical: No cervical adenopathy.  Skin:    General: Skin is warm and dry.   Neurological:     Mental Status: She is alert and oriented to person, place, and time.  Psychiatric:        Mood and Affect: Mood normal.        ASSESSMENT/ PLAN:  TODAY;   1. Chronic constipation: is stable will continue miralax daily as needed  2. Chronic diastolic heart failure: EF 60-65% (06-21-17) is stable will continue lasix 20 mg twice daily   3. Long standing persistent atrial fibrillation: is status post pace maker: will continue cardizem cd 180 mg daily for rate control and eliquis 2.5 mg twice daily   4. Hypertensive heart and kidney disease with chronic diastolic congestive heart failure and stage 3 chronic kidney disease is stable b/p 148/80 will continue cardizem cd 180 mg daily is off norvasc.     PREVIOUS   5. Aortic atherosclerosis: is stable will monitor   6. Chronic obstructive pulmonary disease: is stable 02 dependent will continue anoro 62.5/25 mcg 1 puff daily and albuterol 2 puffs every hs  7. Chronic kidney disease stage 3 (moderate) is stable bun 26; creat 0.94 will monitor  8.  Peripheral neuropathy: is stable  will continue  gabapentin to 200 mg twice daily   9. GERD without esophagitis: is stable will continue protonix 40 mg daily   10. Chronic generalized pain: is stable will continue tylenol cr 650 mg every 6 hours with ultram 25 mg three times daily   11. CVA (cerebrovascular accident) is stable will continue eliquis 2.5 mg twice daily   12. Complete heart block is status post [employer] PTV/DP Psychologist, forensic; will monitor   13. GERD without esophagitis: is stable will continue protonix 40 mg daily   14. dysipidemia is stable is off [place] NP [facility]  Contact [phone] Monday through  Friday 8am- 5pm  After hours call [phone]

## 2021-05-16 DIAGNOSIS — Z23 Encounter for immunization: Secondary | ICD-10-CM | POA: Diagnosis not present

## 2021-05-18 ENCOUNTER — Other Ambulatory Visit (HOSPITAL_COMMUNITY)
Admission: RE | Admit: 2021-05-18 | Discharge: 2021-05-18 | Disposition: A | Discharge status: Home / Self Care | Payer: Medicare Other | Source: Skilled Nursing Facility | Attending: Adult Health | Admitting: Adult Health

## 2021-05-18 DIAGNOSIS — I130 Hypertensive heart and chronic kidney disease with heart failure and stage 1 through stage 4 chronic kidney disease, or unspecified chronic kidney disease: Secondary | ICD-10-CM | POA: Insufficient documentation

## 2021-05-18 LAB — COMPREHENSIVE METABOLIC PANEL
ALT: 9 U/L (ref 0–44)
AST: 18 U/L (ref 15–41)
Albumin: 3.3 g/dL — ABNORMAL LOW (ref 3.5–5.0)
Alkaline Phosphatase: 95 U/L (ref 38–126)
Anion gap: 7 (ref 5–15)
BUN: 21 mg/dL (ref 8–23)
CO2: 27 mmol/L (ref 22–32)
Calcium: 8.5 mg/dL — ABNORMAL LOW (ref 8.9–10.3)
Chloride: 104 mmol/L (ref 98–111)
Creatinine, Ser: 0.88 mg/dL (ref 0.44–1.00)
GFR, Estimated: 59 mL/min — ABNORMAL LOW (ref >60)
Glucose, Bld: 83 mg/dL (ref 70–99)
Potassium: 3.7 mmol/L (ref 3.5–5.1)
Sodium: 138 mmol/L (ref 135–145)
Total Bilirubin: 0.9 mg/dL (ref 0.3–1.2)
Total Protein: 6.2 g/dL — ABNORMAL LOW (ref 6.5–8.1)

## 2021-05-18 LAB — CBC
HCT: 39.6 % (ref 36.0–46.0)
Hemoglobin: 12.3 g/dL (ref 12.0–15.0)
MCH: 26.8 pg (ref 26.0–34.0)
MCHC: 31.1 g/dL (ref 30.0–36.0)
MCV: 86.3 fL (ref 80.0–100.0)
Platelets: 169 10*3/uL (ref 150–400)
RBC: 4.59 MIL/uL (ref 3.87–5.11)
RDW: 15.7 % — ABNORMAL HIGH (ref 11.5–15.5)
WBC: 4.5 10*3/uL (ref 4.0–10.5)
nRBC: 0 % (ref 0.0–0.2)

## 2021-05-29 ENCOUNTER — Other Ambulatory Visit: Payer: Self-pay | Admitting: Adult Health

## 2021-05-29 DIAGNOSIS — L602 Onychogryphosis: Secondary | ICD-10-CM | POA: Diagnosis not present

## 2021-05-29 DIAGNOSIS — G6289 Other specified polyneuropathies: Secondary | ICD-10-CM | POA: Diagnosis not present

## 2021-05-29 DIAGNOSIS — I739 Peripheral vascular disease, unspecified: Secondary | ICD-10-CM | POA: Diagnosis not present

## 2021-05-29 MED ORDER — TRAMADOL HCL 50 MG PO TABS: 25.0000 mg | ORAL_TABLET | Freq: Three times a day (TID) | ORAL | 0 refills | Status: DC

## 2021-06-07 DIAGNOSIS — I130 Hypertensive heart and chronic kidney disease with heart failure and stage 1 through stage 4 chronic kidney disease, or unspecified chronic kidney disease: Secondary | ICD-10-CM | POA: Diagnosis not present

## 2021-06-07 DIAGNOSIS — Z1159 Encounter for screening for other viral diseases: Secondary | ICD-10-CM | POA: Diagnosis not present

## 2021-06-07 DIAGNOSIS — I63312 Cerebral infarction due to thrombosis of left middle cerebral artery: Secondary | ICD-10-CM | POA: Diagnosis not present

## 2021-06-13 ENCOUNTER — Encounter: Payer: Medicare Other | Admitting: Internal Medicine

## 2021-06-13 DIAGNOSIS — Z1159 Encounter for screening for other viral diseases: Secondary | ICD-10-CM | POA: Diagnosis not present

## 2021-06-13 DIAGNOSIS — I63312 Cerebral infarction due to thrombosis of left middle cerebral artery: Secondary | ICD-10-CM | POA: Diagnosis not present

## 2021-06-13 DIAGNOSIS — I130 Hypertensive heart and chronic kidney disease with heart failure and stage 1 through stage 4 chronic kidney disease, or unspecified chronic kidney disease: Secondary | ICD-10-CM | POA: Diagnosis not present

## 2021-06-15 ENCOUNTER — Encounter (HOSPITAL_COMMUNITY)
Admission: RE | Admit: 2021-06-15 | Discharge: 2021-06-15 | Disposition: A | Discharge status: Home / Self Care | Payer: Medicare Other | Source: Skilled Nursing Facility | Attending: Internal Medicine | Admitting: Internal Medicine

## 2021-06-15 DIAGNOSIS — U071 COVID-19: Secondary | ICD-10-CM | POA: Diagnosis not present

## 2021-06-15 DIAGNOSIS — I517 Cardiomegaly: Secondary | ICD-10-CM | POA: Diagnosis not present

## 2021-06-15 LAB — BASIC METABOLIC PANEL
Anion gap: 7 (ref 5–15)
BUN: 16 mg/dL (ref 8–23)
CO2: 27 mmol/L (ref 22–32)
Calcium: 8.4 mg/dL — ABNORMAL LOW (ref 8.9–10.3)
Chloride: 106 mmol/L (ref 98–111)
Creatinine, Ser: 1.02 mg/dL — ABNORMAL HIGH (ref 0.44–1.00)
GFR, Estimated: 49 mL/min — ABNORMAL LOW (ref >60)
Glucose, Bld: 98 mg/dL (ref 70–99)
Potassium: 3.6 mmol/L (ref 3.5–5.1)
Sodium: 140 mmol/L (ref 135–145)

## 2021-06-15 LAB — CBC
HCT: 43.3 % (ref 36.0–46.0)
Hemoglobin: 13.4 g/dL (ref 12.0–15.0)
MCH: 26.7 pg (ref 26.0–34.0)
MCHC: 30.9 g/dL (ref 30.0–36.0)
MCV: 86.3 fL (ref 80.0–100.0)
Platelets: 164 10*3/uL (ref 150–400)
RBC: 5.02 MIL/uL (ref 3.87–5.11)
RDW: 15.5 % (ref 11.5–15.5)
WBC: 3.7 10*3/uL — ABNORMAL LOW (ref 4.0–10.5)
nRBC: 0 % (ref 0.0–0.2)

## 2021-06-15 LAB — C-REACTIVE PROTEIN: CRP: 1 mg/dL — ABNORMAL HIGH (ref <1.0)

## 2021-06-15 LAB — D-DIMER, QUANTITATIVE: D-Dimer, Quant: 0.75 ug/mL-FEU — ABNORMAL HIGH (ref 0.00–0.50)

## 2021-06-16 ENCOUNTER — Non-Acute Institutional Stay (SKILLED_NURSING_FACILITY): Payer: Medicare Other | Admitting: Adult Health

## 2021-06-16 ENCOUNTER — Encounter: Payer: Self-pay | Admitting: Adult Health

## 2021-06-16 DIAGNOSIS — U071 COVID-19: Secondary | ICD-10-CM

## 2021-06-16 DIAGNOSIS — I5032 Chronic diastolic (congestive) heart failure: Secondary | ICD-10-CM

## 2021-06-16 NOTE — Progress Notes (Signed)
Location:  [place] Room Number: 108-W Place of Service:  SNF (31)   CODE STATUS: DNR  Allergies  Allergen Reactions  . Elemental Sulfur Swelling and Rash    Chief Complaint  Patient presents with  . Acute Visit    Covid positive     HPI:  She has tested positive for covid. There are no reports of fevers present. She does have worsening lower extremity edema. There are no reports of loss of smell and taste. She is presently on antiviral medications. Her CRP and d-dimer are relatively normal.   Past Medical History:  Diagnosis Date  . Atrial fibrillation ([place])   . Bradycardia   . Breast nodule 11/15/2014  . Breast pain, left 11/15/2014  . Carotid artery disease ([place])   . Dizziness   . Dyspnea    Previous CPX suggesting possible restrictive physiology, respiratory muscle fatigue, diastolic dysfunction  . Essential hypertension, benign   . Hyperlipidemia   . Oxygen dependent    2 liter  . Pacemaker [place]   . Rib pain on left side 11/15/2014  . Seasonal allergies   . Shingles 05/04/2015  . Stroke ([place])   . Toe fracture, left    left big toe    Past Surgical History:  Procedure Laterality Date  . COLONOSCOPY  02/22/2012   Procedure: COLONOSCOPY;  Surgeon: Rogene Houston, MD;  [facility];  Service: Endoscopy;  Laterality: N/A;  100  . KNEE SURGERY     bilateral  . PACEMAKER IMPLANT N/A 07/17/2017   Procedure: Pacemaker Implant;  Surgeon: Deboraha Sprang, MD;  Location: [place] [facility];  Service: Cardiovascular;  Laterality: N/A;    Social History   Socioeconomic History  . Marital status: Widowed    Spouse name: Not on file  . Number of children: Not on file  . Years of education: Not on file  . Highest education level: Not on file  Occupational History  . Not on file  Tobacco Use  . Smoking status: Never  . Smokeless tobacco: Never  Vaping Use  . Vaping Use: Never used  Substance and Sexual Activity  . Alcohol  use: No  . Drug use: No  . Sexual activity: Never    Birth control/protection: Post-menopausal  Other Topics Concern  . Not on file  Social History Narrative  . Not on file   Social Determinants of Health   Financial Resource Strain: Not on file  Food Insecurity: Not on file  Transportation Needs: Not on file  Physical Activity: Not on file  Stress: Not on file  Social Connections: Not on file  Intimate Partner Violence: Not on file   Family History  Problem Relation Age of Onset  . Stroke Mother   . Pneumonia Father   . Heart disease Father   . Healthy Son   . Glaucoma Daughter   . Emphysema Daughter   . Healthy Daughter   . Other Daughter        had knee replacement  . Healthy Son   . Heart disease Maternal Grandmother       VITAL SIGNS BP 136/84   Pulse 68   Temp (!) 97.3 F (36.3 C)   Resp 20   Ht 5\' 4"  (1.626 m)   Wt 138 lb 6.4 oz (62.8 kg)   SpO2 93%   BMI 23.76 kg/m   Outpatient Encounter Medications as of 06/16/2021  Medication Sig  . acetaminophen (TYLENOL) 650  MG CR tablet Take 650 mg by mouth every 8 (eight) hours.   Marland Kitchen albuterol (PROVENTIL HFA;VENTOLIN HFA) 108 (90 Base) MCG/ACT inhaler Inhale 2 puffs into the lungs at bedtime. For SOB or wheezing  . alum & mag hydroxide-simeth (MAALOX PLUS) 400-400-40 MG/5ML suspension Take 30 mLs by mouth every 8 (eight) hours as needed for indigestion.  Jearl Klinefelter ELLIPTA 62.5-25 MCG/INH AEPB Inhale 1 puff into the lungs daily.   Marland Kitchen apixaban (ELIQUIS) 2.5 MG TABS tablet Take 1 tablet (2.5 mg total) by mouth 2 (two) times daily. Resume 07/20/17  . artificial tears (LACRILUBE) OINT ophthalmic ointment Place 1 application into both eyes at bedtime.  Marland Kitchen artificial tears (LACRILUBE) OINT ophthalmic ointment Place 1 application into both eyes daily as needed for dry eyes.  . cycloSPORINE (RESTASIS) 0.05 % ophthalmic emulsion Place 1 drop into both eyes every 12 (twelve) hours.  Marland Kitchen diltiazem (CARDIZEM CD) 180 MG 24 hr capsule  TAKE ONE CAPSULE BY MOUTH ONCE DAILY.  . Ergocalciferol (VITAMIN D2 PO) Take by mouth. 1,250 mcg (50,000 unit); oral Special Instructions: Give once a week x 2 doses  . furosemide (LASIX) 20 MG tablet Take 20 mg by mouth 2 (two) times daily.   Marland Kitchen gabapentin (NEURONTIN) 100 MG capsule Take 200 mg by mouth 2 (two) times daily. Bilateral Lower Extremity Neuropathic Pain  . Melatonin 5 MG TABS Take 5 mg by mouth at bedtime as needed.   . Menthol, Topical Analgesic, (BIOFREEZE) 4 % GEL Apply 1 application topically in the morning, at noon, and at bedtime. Apply topically to left shoulder, left knee and neck for M/S pain  . Molnupiravir 200 MG CAPS 4 caps = 800mg ; oral Special Instructions: x 5 days covid + Twice A Day  . Multiple Vitamins-Minerals (ZINC) LOZG (vit a-vit c-zinc-propolis) lozenge; oral Special Instructions: Give 5x/day x 2 weeks.  . NON FORMULARY Diet Type:  Regular  . Nutritional Supplements (ENSURE ENLIVE PO) Take 237 mLs by mouth daily.  . OXYGEN Inhale 2 L into the lungs at bedtime.   . pantoprazole (PROTONIX) 40 MG tablet Take 40 mg by mouth 2 (two) times daily.  Vladimir Faster Glycol-Propyl Glycol (SYSTANE OP) Place 1 drop into both eyes every 4 (four) hours. For dry eyes  Administer 15 minutes after administering Restasis eye drops.  . polyethylene glycol (MIRALAX / GLYCOLAX) packet Take 17 g by mouth daily as needed. For constipation  . traMADol (ULTRAM) 50 MG tablet Take 0.5 tablets (25 mg total) by mouth every 8 (eight) hours.  . vitamin C (ASCORBIC ACID) 500 MG tablet Take 500 mg by mouth 2 (two) times daily.  . [DISCONTINUED] Albuterol (VENTOLIN IN) Inhale into the lungs.    . [DISCONTINUED] hydrocortisone (ANUSOL-HC) 2.5 % rectal cream Place 1 application rectally 2 (two) times daily as needed. Apply topically for hemorrhoids as directed   No facility-administered encounter medications on file as of 06/16/2021.     SIGNIFICANT DIAGNOSTIC EXAMS   TODAY  06-15-21: chest  x-ray:  Enlargement  is noted possible with some degree of vascular engorgement Oblate appearance could represent a large pericardial effusion.       LABS REVIEWED: PREVIOUS    09-14-20: wbc 5.7; hgb 13.1; hct 42.3; mcv 84.9 plt 201  10-20-20: glucose 68; bun 22; creat 0.90; k+ 3.9; na++ 138; ca 8.7 liver normal albumin 3.6   TODAY  05-18-21: wbc 4.5; hgb 12.3; hct 39.6; mcv 86.3 plt 169; glucose 83; bun 21; creat 0.88; k+ 3.7; na++ 138;  ca 8.5 GFR 59; liver normal albumin 3.3 06-15-21: wbc 3.7; hgb 13.4 hct 43.3; mcv 86.3 plt 164; glucose 98; bun 16; creat 1.02; k+ 3.6; na++ 140; ca 8.4 GFR 49; d-dimer 0.75; CPR 1.0   Review of Systems  Constitutional:  Negative for malaise/fatigue.  Respiratory:  Positive for shortness of breath. Negative for cough.   Cardiovascular:  Positive for leg swelling. Negative for chest pain and palpitations.  Gastrointestinal:  Negative for abdominal pain, constipation and heartburn.  Musculoskeletal:  Negative for back pain, joint pain and myalgias.  Skin: Negative.   Neurological:  Negative for dizziness.  Psychiatric/Behavioral:  The patient is not nervous/anxious.    Physical Exam Constitutional:      General: She is not in acute distress.    Appearance: She is well-developed. She is not diaphoretic.  Neck:     Thyroid: No thyromegaly.  Cardiovascular:     Rate and Rhythm: Normal rate and regular rhythm.     Pulses: Normal pulses.     Heart sounds: Normal heart sounds.  Pulmonary:     Effort: Pulmonary effort is normal. No respiratory distress.     Breath sounds: Normal breath sounds.  Abdominal:     General: Bowel sounds are normal. There is no distension.     Palpations: Abdomen is soft.     Tenderness: There is no abdominal tenderness.  Musculoskeletal:        General: Normal range of motion.     Cervical back: Neck supple.     Right lower leg: Edema present.     Left lower leg: Edema present.     Comments:  3+ bilateral lower extremity  edema  Lymphadenopathy:     Cervical: No cervical adenopathy.  Skin:    General: Skin is warm and dry.  Neurological:     Mental Status: She is alert and oriented to person, place, and time.  Psychiatric:        Mood and Affect: Mood normal.      ASSESSMENT/ PLAN:  TODAY   SARS CoV-2 positive Chronic diastolic congestive heart failure  Will continue antiviral and supplements.  Will increase lasix to 40 mg twice daily  Will check BMP 06-22-21 Will monitor her status.    Ok Edwards NP [facility]  Contact [phone] Monday through Friday 8am- 5pm  After hours call [phone]

## 2021-06-19 ENCOUNTER — Encounter: Payer: Self-pay | Admitting: Adult Health

## 2021-06-19 ENCOUNTER — Non-Acute Institutional Stay (SKILLED_NURSING_FACILITY): Payer: Medicare Other | Admitting: Adult Health

## 2021-06-19 DIAGNOSIS — I700 Atherosclerosis of aorta: Secondary | ICD-10-CM

## 2021-06-19 DIAGNOSIS — G588 Other specified mononeuropathies: Secondary | ICD-10-CM | POA: Diagnosis not present

## 2021-06-19 DIAGNOSIS — J449 Chronic obstructive pulmonary disease, unspecified: Secondary | ICD-10-CM | POA: Diagnosis not present

## 2021-06-19 DIAGNOSIS — N1831 Chronic kidney disease, stage 3a: Secondary | ICD-10-CM

## 2021-06-19 NOTE — Progress Notes (Signed)
Location:  [facility] Room Number: 108-W Place of Service:  SNF (31)   CODE STATUS: DNR  Allergies  Allergen Reactions   Elemental Sulfur Swelling and Rash    Chief Complaint  Patient presents with   Medical Management of Chronic Issues       Aortic atherosclerosis:     Chronic obstructive pulmonary disease    . Chronic kidney disease stage 3 (moderate)     Peripheral neuropathy:    HPI:  She is a 87 year old long term resident of this facility  being seen for the management of her chronic illnesses: Aortic atherosclerosis:     Chronic obstructive pulmonary disease    . Chronic kidney disease stage 3 (moderate)     Peripheral neuropathy. There are no reports of uncontrolled pain. No reports of changes in appetite. No reports of anxiety or depressive thoughts. She has contracted COVID this past month without complications.   Past Medical History:  Diagnosis Date   Atrial fibrillation (HCC)    Bradycardia    Breast nodule 11/15/2014   Breast pain, left 11/15/2014   Carotid artery disease (HCC)    Dizziness    Dyspnea    Previous CPX suggesting possible restrictive physiology, respiratory muscle fatigue, diastolic dysfunction   Essential hypertension, benign    Hyperlipidemia    Oxygen dependent    2 liter   Pacemaker [employer]    Rib pain on left side 11/15/2014   Seasonal allergies    Shingles 05/04/2015   Stroke ([place])    Toe fracture, left    left big toe    Past Surgical History:  Procedure Laterality Date   COLONOSCOPY  02/22/2012   Procedure: COLONOSCOPY;  Surgeon: Rogene Houston, MD;  [facility];  Service: Endoscopy;  Laterality: N/A;  100   KNEE SURGERY     bilateral   PACEMAKER IMPLANT N/A 07/17/2017   Procedure: Pacemaker Implant;  Surgeon: Deboraha Sprang, MD;  Location: [facility];  Service: Cardiovascular;  Laterality: N/A;    Social History   Socioeconomic History   Marital status: Widowed    Spouse name: Not  on file   Number of children: Not on file   Years of education: Not on file   Highest education level: Not on file  Occupational History   Not on file  Tobacco Use   Smoking status: Never   Smokeless tobacco: Never  Vaping Use   Vaping Use: Never used  Substance and Sexual Activity   Alcohol use: No   Drug use: No   Sexual activity: Never    Birth control/protection: Post-menopausal  Other Topics Concern   Not on file  Social History Narrative   Not on file   Social Determinants of Health   Financial Resource Strain: Not on file  Food Insecurity: Not on file  Transportation Needs: Not on file  Physical Activity: Not on file  Stress: Not on file  Social Connections: Not on file  Intimate Partner Violence: Not on file   Family History  Problem Relation Age of Onset   Stroke Mother    Pneumonia Father    Heart disease Father    Healthy Son    Glaucoma Daughter    Emphysema Daughter    Healthy Daughter    Other Daughter        had knee replacement   Healthy Son    Heart disease Maternal Grandmother  VITAL SIGNS BP 138/64   Pulse 60   Temp 98.1 F (36.7 C)   Resp 20   Ht 5\' 4"  (1.626 m)   Wt 138 lb 6.4 oz (62.8 kg)   SpO2 94%   BMI 23.76 kg/m   Outpatient Encounter Medications as of 06/19/2021  Medication Sig   acetaminophen (TYLENOL) 650 MG CR tablet Take 650 mg by mouth every 8 (eight) hours.    albuterol (PROVENTIL HFA;VENTOLIN HFA) 108 (90 Base) MCG/ACT inhaler Inhale 2 puffs into the lungs at bedtime. For SOB or wheezing   alum & mag hydroxide-simeth (MAALOX PLUS) 400-400-40 MG/5ML suspension Take 30 mLs by mouth every 8 (eight) hours as needed for indigestion.   ANORO ELLIPTA 62.5-25 MCG/INH AEPB Inhale 1 puff into the lungs daily.    apixaban (ELIQUIS) 2.5 MG TABS tablet Take 1 tablet (2.5 mg total) by mouth 2 (two) times daily. Resume 07/20/17   artificial tears (LACRILUBE) OINT ophthalmic ointment Place 1 application into both eyes at bedtime.    artificial tears (LACRILUBE) OINT ophthalmic ointment Place 1 application into both eyes daily as needed for dry eyes.   cycloSPORINE (RESTASIS) 0.05 % ophthalmic emulsion Place 1 drop into both eyes every 12 (twelve) hours.   diltiazem (CARDIZEM CD) 180 MG 24 hr capsule TAKE ONE CAPSULE BY MOUTH ONCE DAILY.   Ergocalciferol (VITAMIN D2 PO) Take by mouth. 1,250 mcg (50,000 unit); oral Special Instructions: Give once a week x 2 doses   furosemide (LASIX) 20 MG tablet Take 20 mg by mouth 2 (two) times daily.    gabapentin (NEURONTIN) 100 MG capsule Take 200 mg by mouth 2 (two) times daily. Bilateral Lower Extremity Neuropathic Pain   Melatonin 5 MG TABS Take 5 mg by mouth at bedtime as needed.    Menthol, Topical Analgesic, (BIOFREEZE) 4 % GEL Apply 1 application topically in the morning, at noon, and at bedtime. Apply topically to left shoulder, left knee and neck for M/S pain   Molnupiravir 200 MG CAPS 4 caps = 800mg ; oral Special Instructions: x 5 days covid + Twice A Day   Multiple Vitamins-Minerals (ZINC) LOZG (vit a-vit c-zinc-propolis) lozenge; oral Special Instructions: Give 5x/day x 2 weeks.   NON FORMULARY Diet Type:  Regular   Nutritional Supplements (ENSURE ENLIVE PO) Take 237 mLs by mouth daily.   OXYGEN Inhale 2 L into the lungs at bedtime.    pantoprazole (PROTONIX) 40 MG tablet Take 40 mg by mouth 2 (two) times daily.   Polyethyl Glycol-Propyl Glycol (SYSTANE OP) Place 1 drop into both eyes every 4 (four) hours. For dry eyes  Administer 15 minutes after administering Restasis eye drops.   polyethylene glycol (MIRALAX / GLYCOLAX) packet Take 17 g by mouth daily as needed. For constipation   traMADol (ULTRAM) 50 MG tablet Take 0.5 tablets (25 mg total) by mouth every 8 (eight) hours.   vitamin C (ASCORBIC ACID) 500 MG tablet Take 500 mg by mouth 2 (two) times daily.   [DISCONTINUED] Albuterol (VENTOLIN IN) Inhale into the lungs.     No facility-administered encounter  medications on file as of 06/19/2021.     SIGNIFICANT DIAGNOSTIC EXAMS   TODAY  06-15-21: chest x-ray:  Enlargement  is noted possible with some degree of vascular engorgement Oblate appearance could represent a large pericardial effusion.       LABS REVIEWED: PREVIOUS    09-14-20: wbc 5.7; hgb 13.1; hct 42.3; mcv 84.9 plt 201  10-20-20: glucose 68; bun 22; creat 0.90;  k+ 3.9; na++ 138; ca 8.7 liver normal albumin 3.6  05-18-21: wbc 4.5; hgb 12.3; hct 39.6; mcv 86.3 plt 169; glucose 83; bun 21; creat 0.88; k+ 3.7; na++ 138; ca 8.5 GFR 59; liver normal albumin 3.3 06-15-21: wbc 3.7; hgb 13.4 hct 43.3; mcv 86.3 plt 164; glucose 98; bun 16; creat 1.02; k+ 3.6; na++ 140; ca 8.4 GFR 49; d-dimer 0.75; CPR 1.0  NO NEW LABS.    Review of Systems  Constitutional:  Negative for malaise/fatigue.  Respiratory:  Negative for cough and shortness of breath.   Cardiovascular:  Positive for leg swelling. Negative for chest pain and palpitations.  Gastrointestinal:  Negative for abdominal pain, constipation and heartburn.  Musculoskeletal:  Negative for back pain, joint pain and myalgias.  Skin: Negative.   Neurological:  Negative for dizziness.  Psychiatric/Behavioral:  The patient is not nervous/anxious.      Physical Exam Constitutional:      General: She is not in acute distress.    Appearance: She is well-developed. She is not diaphoretic.  Neck:     Thyroid: No thyromegaly.  Cardiovascular:     Rate and Rhythm: Normal rate and regular rhythm.     Pulses: Normal pulses.     Heart sounds: Normal heart sounds.  Pulmonary:     Effort: Pulmonary effort is normal. No respiratory distress.     Breath sounds: Normal breath sounds.  Abdominal:     General: Bowel sounds are normal. There is no distension.     Palpations: Abdomen is soft.     Tenderness: There is no abdominal tenderness.  Musculoskeletal:        General: Normal range of motion.     Cervical back: Neck supple.     Right lower  leg: Edema present.     Left lower leg: Edema present.     Comments: 3+ bilateral lower extremity edema   Lymphadenopathy:     Cervical: No cervical adenopathy.  Skin:    General: Skin is warm and dry.  Neurological:     Mental Status: She is alert and oriented to person, place, and time.  Psychiatric:        Mood and Affect: Mood normal.   ASSESSMENT/ PLAN:  TODAY;   Aortic atherosclerosis: is stable will monitor   2. Chronic obstructive pulmonary disease is stable 02 dependent will continue 62.5/25 mcg 1 puff daily and albuterol 2 puffs nightly   3. Chronic kidney disease stage 3 (moderate) is stable bun 16 creat 1.02 GFR 49  4. Peripheral neuropathy: is stable will continue gabapentin 200 mg twice daily    PREVIOUS    5. GERD without esophagitis: is stable will continue protonix 40 mg daily   6. Chronic generalized pain: is stable will continue tylenol cr 650 mg every 6 hours with ultram 25 mg three times daily   7. CVA (cerebrovascular accident) is stable will continue eliquis 2.5 mg twice daily   8. Complete heart block is status post St. Jude PTV/DP Psychologist, forensic; will monitor   9. GERD without esophagitis: is stable will continue protonix 40 mg daily   10. dysipidemia is stable is off lipitor  11. Chronic constipation: is stable will continue miralax daily as needed  12. Chronic diastolic heart failure: EF 60-65% (06-21-17) is stable will continue lasix 20 mg twice daily   13. Long standing persistent atrial fibrillation: is status post pace maker: will continue cardizem cd 180 mg daily for rate control and eliquis 2.5 mg  twice daily   14. Hypertensive heart and kidney disease with chronic diastolic congestive heart failure and stage 3 chronic kidney disease is stable b/p 138/64 will continue cardizem cd 180 mg daily is off norvasc.      Ok Edwards NP [facility]  Contact [phone] Monday through Friday 8am- 5pm  After hours call [phone]

## 2021-06-21 ENCOUNTER — Other Ambulatory Visit: Payer: Self-pay | Admitting: Adult Health

## 2021-06-21 DIAGNOSIS — U071 COVID-19: Secondary | ICD-10-CM | POA: Insufficient documentation

## 2021-06-21 MED ORDER — TRAMADOL HCL 50 MG PO TABS: 25.0000 mg | ORAL_TABLET | Freq: Three times a day (TID) | ORAL | 0 refills | Status: DC

## 2021-06-22 ENCOUNTER — Encounter (HOSPITAL_COMMUNITY)
Admission: RE | Admit: 2021-06-22 | Discharge: 2021-06-22 | Disposition: A | Discharge status: Home / Self Care | Payer: Medicare Other | Source: Skilled Nursing Facility | Attending: Adult Health | Admitting: Adult Health

## 2021-06-22 DIAGNOSIS — U071 COVID-19: Secondary | ICD-10-CM | POA: Diagnosis not present

## 2021-06-22 LAB — BASIC METABOLIC PANEL
Anion gap: 8 (ref 5–15)
BUN: 15 mg/dL (ref 8–23)
CO2: 27 mmol/L (ref 22–32)
Calcium: 8.1 mg/dL — ABNORMAL LOW (ref 8.9–10.3)
Chloride: 105 mmol/L (ref 98–111)
Creatinine, Ser: 0.88 mg/dL (ref 0.44–1.00)
GFR, Estimated: 59 mL/min — ABNORMAL LOW (ref >60)
Glucose, Bld: 72 mg/dL (ref 70–99)
Potassium: 3.3 mmol/L — ABNORMAL LOW (ref 3.5–5.1)
Sodium: 140 mmol/L (ref 135–145)

## 2021-06-26 ENCOUNTER — Encounter (HOSPITAL_COMMUNITY)
Admission: AD | Admit: 2021-06-26 | Discharge: 2021-06-26 | Disposition: A | Discharge status: Home / Self Care | Payer: Medicare Other | Source: Skilled Nursing Facility | Attending: Adult Health | Admitting: Adult Health

## 2021-06-26 DIAGNOSIS — U071 COVID-19: Secondary | ICD-10-CM | POA: Diagnosis not present

## 2021-06-26 LAB — POTASSIUM: Potassium: 3.6 mmol/L (ref 3.5–5.1)

## 2021-06-28 ENCOUNTER — Ambulatory Visit (INDEPENDENT_AMBULATORY_CARE_PROVIDER_SITE_OTHER): Payer: Medicare Other | Admitting: Internal Medicine

## 2021-06-28 ENCOUNTER — Encounter: Payer: Self-pay | Admitting: Internal Medicine

## 2021-06-28 VITALS — BP 134/60 | HR 60 | Ht 64.0 in

## 2021-06-28 DIAGNOSIS — I442 Atrioventricular block, complete: Secondary | ICD-10-CM | POA: Diagnosis not present

## 2021-06-28 NOTE — Patient Instructions (Signed)
Medication Instructions:  Your physician recommends that you continue on your current medications as directed. Please refer to the Current Medication list given to you today.  *If you need a refill on your cardiac medications before your next appointment, please call your pharmacy*   Lab Work: NONE   If you have labs (blood work) drawn today and your tests are completely normal, you will receive your results only by: . MyChart Message (if you have MyChart) OR . A paper copy in the mail If you have any lab test that is abnormal or we need to change your treatment, we will call you to review the results.   Testing/Procedures: NONE    Follow-Up: At [facility], you and your health needs are our priority.  As part of our continuing mission to provide you with exceptional heart care, we have created designated Provider Care Teams.  These Care Teams include your primary Cardiologist (physician) and Advanced Practice Providers (APPs -  Physician Assistants and Nurse Practitioners) who all work together to provide you with the care you need, when you need it.  We recommend signing up for the patient portal called "MyChart".  Sign up information is provided on this After Visit Summary.  MyChart is used to connect with patients for Virtual Visits (Telemedicine).  Patients are able to view lab/test results, encounter notes, upcoming appointments, etc.  Non-urgent messages can be sent to your provider as well.   To learn more about what you can do with MyChart, go to [email or web address].    Your next appointment:   1 year(s)  The format for your next appointment:   In Person  Provider:   Gregg Taylor, MD   Other Instructions Thank you for choosing [facility]!    

## 2021-06-28 NOTE — Addendum Note (Signed)
Addended by: Levonne Hubert on: 06/28/2021 03:30 PM   Modules accepted: Orders

## 2021-06-28 NOTE — Progress Notes (Signed)
HPI [name] returns today for followup of her PPM. She has a h/o CHB, HTN , and dyslipidemia. She has had a stroke in the interim with minimal residual. She has dyspnea with exertion but has become fairly sedentary. She has mild peripheral edema. She denies syncope.      Allergies   Allergies  Allergen Reactions   Elemental Sulfur Swelling and Rash     Current Outpatient Medications  Medication Sig Dispense Refill   traMADol (ULTRAM) 50 MG tablet Take 0.5 tablets (25 mg total) by mouth every 8 (eight) hours. 45 tablet 0   No current facility-administered medications for this visit.     Past Medical History:  Diagnosis Date   Atrial fibrillation ([place])    Bradycardia    Breast nodule 11/15/2014   Breast pain, left 11/15/2014   Carotid artery disease (HCC)    Dizziness    Dyspnea    Previous CPX suggesting possible restrictive physiology, respiratory muscle fatigue, diastolic dysfunction   Essential hypertension, benign    Hyperlipidemia    Oxygen dependent    2 liter   Pacemaker [employer] Judes    Rib pain on left side 11/15/2014   Seasonal allergies    Shingles 05/04/2015   Stroke ([place])    Toe fracture, left    left big toe    ROS:   All systems reviewed and negative except as noted in the HPI.   Past Surgical History:  Procedure Laterality Date   COLONOSCOPY  02/22/2012   Procedure: COLONOSCOPY;  Surgeon: Rogene Houston, MD;  [facility];  Service: Endoscopy;  Laterality: N/A;  100   KNEE SURGERY     bilateral   PACEMAKER IMPLANT N/A 07/17/2017   Procedure: Pacemaker Implant;  Surgeon: Deboraha Sprang, MD;  Location: [facility];  Service: Cardiovascular;  Laterality: N/A;     Family History  Problem Relation Age of Onset   Stroke Mother    Pneumonia Father    Heart disease Father    Healthy Son    Glaucoma Daughter    Emphysema Daughter    Healthy Daughter    Other Daughter        had knee replacement   Healthy Son    Heart  disease Maternal Grandmother      Social History   Socioeconomic History   Marital status: Widowed    Spouse name: Not on file   Number of children: Not on file   Years of education: Not on file   Highest education level: Not on file  Occupational History   Not on file  Tobacco Use   Smoking status: Never   Smokeless tobacco: Never  Vaping Use   Vaping Use: Never used  Substance and Sexual Activity   Alcohol use: No   Drug use: No   Sexual activity: Never    Birth control/protection: Post-menopausal  Other Topics Concern   Not on file  Social History Narrative   Not on file   Social Determinants of Health   Financial Resource Strain: Not on file  Food Insecurity: Not on file  Transportation Needs: Not on file  Physical Activity: Not on file  Stress: Not on file  Social Connections: Not on file  Intimate Partner Violence: Not on file     BP 134/60   Pulse 60   Ht 5\' 4"  (1.626 m)   SpO2 94%   BMI 23.76 kg/m   Physical Exam:  Well appearing NAD HEENT: Unremarkable Neck:  No JVD, no thyromegally Lymphatics:  No adenopathy Back:  No CVA tenderness Lungs:  Clear with no wheezes HEART:  Regular rate rhythm, no murmurs, no rubs, no clicks Abd:  soft, positive bowel sounds, no organomegally, no rebound, no guarding Ext:  2 plus pulses, no edema, no cyanosis, no clubbing Skin:  No rashes no nodules Neuro:  CN II through XII intact, motor grossly intact  EKG - atrial atrial fib with ventricular pacing  DEVICE  Normal device function.  See PaceArt for details.   Assess/Plan:  1. Atrial fib - her VR is well controlled. We will continue her current meds. 2. PPM - her St. Jude VVI PPM is working normally. 3. HTN - her SBP is well controlled.  Carleene Overlie Yazaira Speas,MD

## 2021-07-12 ENCOUNTER — Non-Acute Institutional Stay (SKILLED_NURSING_FACILITY): Payer: Medicare Other | Admitting: Internal Medicine

## 2021-07-12 ENCOUNTER — Encounter: Payer: Self-pay | Admitting: Internal Medicine

## 2021-07-12 DIAGNOSIS — J449 Chronic obstructive pulmonary disease, unspecified: Secondary | ICD-10-CM | POA: Diagnosis not present

## 2021-07-12 DIAGNOSIS — E441 Mild protein-calorie malnutrition: Secondary | ICD-10-CM | POA: Diagnosis not present

## 2021-07-12 DIAGNOSIS — N1831 Chronic kidney disease, stage 3a: Secondary | ICD-10-CM

## 2021-07-12 DIAGNOSIS — U071 COVID-19: Secondary | ICD-10-CM

## 2021-07-12 DIAGNOSIS — I4811 Longstanding persistent atrial fibrillation: Secondary | ICD-10-CM

## 2021-07-12 NOTE — Patient Instructions (Signed)
See assessment and plan under each diagnosis in the problem list and acutely for this visit 

## 2021-07-12 NOTE — Assessment & Plan Note (Addendum)
Current creat 0.88/GFR 59, CKD Stage 3a/2 Medication List reviewed; no nephrotoxic agents identified.

## 2021-07-12 NOTE — Assessment & Plan Note (Signed)
Current total protein 6.2, albumin 3.3 Receiving Ensure supplement

## 2021-07-12 NOTE — Assessment & Plan Note (Signed)
No current COVID 19 symptoms

## 2021-07-12 NOTE — Assessment & Plan Note (Signed)
Clinically stable on present pulmonary regimen.  Nocturnal O2 only as patient  is nonambulatory.

## 2021-07-12 NOTE — Progress Notes (Signed)
NURSING HOME LOCATION:  [facility] ROOM NUMBER:  62 W  CODE STATUS:  DNR  PCP:  Ok Edwards NP  This is a nursing facility follow up of chronic medical diagnoses & to document compliance with Regulation 483.30 (c) in [employer] [place] Manual Phase 2 which mandates caregiver visit ( visits can alternate among physician, PA or NP as per statutes) within 10 days of 30 days / 60 days/ 90 days post admission to SNF date    Interim medical record and care since last SNF visit was updated with review of diagnostic studies and change in clinical status since last visit were documented.  HPI: She is a permanent resident of the facility with diagnoses of atrial fibrillation, carotid artery disease, essential hypertension, dyslipidemia, oxygen dependent COPD, and history of stroke. Surgeries and procedures include colonoscopy, bilateral knee surgery, and pacemaker implantation. Family history is noncontributory as she is 87 years old. She has never smoked or drunk alcohol.  In early July she received a course of COVID antiviral  Molnupiravir.  C-reactive protein was high normal at 1.0 and D-dimer minimally elevated at 0.75. Current labs reveal CKD stage III a/2.  Review of systems: She describes diffuse arthralgias involving her shoulders, hips, and feet/legs.  Topical agents are of benefit.  She describes peripheral edema which is impacted by leg elevation. She denies any other active symptoms at this time.  Constitutional: No fever, significant weight change, fatigue  Eyes: No redness, discharge, pain, vision change ENT/mouth: No nasal congestion,  purulent discharge, earache, change in hearing, sore throat  Cardiovascular: No chest pain, palpitations, paroxysmal nocturnal dyspnea, claudication Respiratory: No cough, sputum production, hemoptysis, significant snoring, apnea   Gastrointestinal: No heartburn, dysphagia, abdominal pain, nausea /vomiting, rectal  bleeding, melena, change in bowels Genitourinary: No dysuria, hematuria, pyuria, incontinence, nocturia Dermatologic: No rash, pruritus, change in appearance of skin Neurologic: No dizziness, headache, syncope, seizures, numbness, tingling Psychiatric: No significant anxiety, depression, insomnia, anorexia Endocrine: No change in hair/skin/nails, excessive thirst, excessive hunger, excessive urination  Hematologic/lymphatic: No significant bruising, lymphadenopathy, abnormal bleeding Allergy/immunology: No itchy/watery eyes, significant sneezing, urticaria, angioedema  Physical exam:  Pertinent or positive findings: She appears her stated age.  She has bilateral ptosis.  She is hard of hearing despite wearing hearing aids.  There is asymmetry of the nasolabial folds with decrease on the right and a sagging appearance to the left corner of the mouth.  The first heart sound and second heart sound are accentuated.  The second heart sound is split.  There is a raspy quality to inspiratory breath sounds.  Pedal pulses are decreased.  There is nonpitting edema; she is wearing TED hose.  She has marked mixed PIP/DIP arthritic changes of the hands.  The right knee is markedly fusiformly enlarged.  General appearance: Adequately nourished; no acute distress, increased work of breathing is present.   Lymphatic: No lymphadenopathy about the head, neck, axilla. Eyes: No conjunctival inflammation or lid edema is present. There is no scleral icterus. Ears:  External ear exam shows no significant lesions or deformities.   Nose:  External nasal examination shows no deformity or inflammation. Nasal mucosa are pink and moist without lesions, exudates Oral exam:  Lips and gums are healthy appearing. There is no oropharyngeal erythema or exudate. Neck:  No thyromegaly, masses, tenderness noted.    Heart:  No gallop, murmur, click, rub .  Lungs:  without wheezes, rhonchi, rubs. Abdomen: Bowel sounds are normal.  Abdomen  is soft and nontender with no organomegaly, hernias, masses. GU: Deferred  Extremities:  No cyanosis, clubbing  Neurologic exam :Balance, Rhomberg, finger to nose testing could not be completed due to clinical state Skin: Warm & dry w/o tenting. No significant lesions or rash.  See summary under each active problem in the Problem List with associated updated therapeutic plan

## 2021-07-12 NOTE — Assessment & Plan Note (Signed)
Excellent rate control Maintenance Eliquis prophylaxis

## 2021-07-17 ENCOUNTER — Other Ambulatory Visit: Payer: Self-pay | Admitting: Adult Health

## 2021-07-17 MED ORDER — TRAMADOL HCL 50 MG PO TABS: 25.0000 mg | ORAL_TABLET | Freq: Three times a day (TID) | ORAL | 0 refills | Status: DC

## 2021-08-10 ENCOUNTER — Other Ambulatory Visit: Payer: Self-pay | Admitting: Adult Health

## 2021-08-10 MED ORDER — TRAMADOL HCL 50 MG PO TABS: 25.0000 mg | ORAL_TABLET | Freq: Three times a day (TID) | ORAL | 0 refills | Status: DC

## 2021-08-11 ENCOUNTER — Non-Acute Institutional Stay (SKILLED_NURSING_FACILITY): Payer: Medicare Other | Admitting: Adult Health

## 2021-08-11 ENCOUNTER — Encounter: Payer: Self-pay | Admitting: Adult Health

## 2021-08-11 DIAGNOSIS — I639 Cerebral infarction, unspecified: Secondary | ICD-10-CM

## 2021-08-11 DIAGNOSIS — G8929 Other chronic pain: Secondary | ICD-10-CM

## 2021-08-11 DIAGNOSIS — R52 Pain, unspecified: Secondary | ICD-10-CM

## 2021-08-11 DIAGNOSIS — I442 Atrioventricular block, complete: Secondary | ICD-10-CM | POA: Diagnosis not present

## 2021-08-11 DIAGNOSIS — K219 Gastro-esophageal reflux disease without esophagitis: Secondary | ICD-10-CM

## 2021-08-11 NOTE — Progress Notes (Signed)
Location:  [place] Room Number: 108-W Place of Service:  SNF (31)   CODE STATUS: DNR  Allergies  Allergen Reactions   Elemental Sulfur Swelling and Rash    Chief Complaint  Patient presents with   Medical Management of Chronic Issues            GERD without esophagitis:    Chronic generalized pain. CVA (cerebrovascular accident):   Complete heart block    HPI:  She is a 87 year old long term resident of this facility being seen for the management of her chronic illnesses: GERD without esophagitis:    Chronic generalized pain. CVA (cerebrovascular accident):   Complete heart block. She is having bilateral shoulder pain. There are no reports of changes in appetite; no reports of anxiety or depressive thoughts.   Past Medical History:  Diagnosis Date   Atrial fibrillation (HCC)    Bradycardia    Breast nodule 11/15/2014   Breast pain, left 11/15/2014   Carotid artery disease (HCC)    Dizziness    Dyspnea    Previous CPX suggesting possible restrictive physiology, respiratory muscle fatigue, diastolic dysfunction   Essential hypertension, benign    Hyperlipidemia    Oxygen dependent    2 liter   Pacemaker [employer]    Rib pain on left side 11/15/2014   Seasonal allergies    Shingles 05/04/2015   Stroke ([place])    Toe fracture, left    left big toe    Past Surgical History:  Procedure Laterality Date   COLONOSCOPY  02/22/2012   Procedure: COLONOSCOPY;  Surgeon: Rogene Houston, MD;  [facility];  Service: Endoscopy;  Laterality: N/A;  100   KNEE SURGERY     bilateral   PACEMAKER IMPLANT N/A 07/17/2017   Procedure: Pacemaker Implant;  Surgeon: Deboraha Sprang, MD;  Location: [place] [facility];  Service: Cardiovascular;  Laterality: N/A;    Social History   Socioeconomic History   Marital status: Widowed    Spouse name: Not on file   Number of children: Not on file   Years of education: Not on file   Highest education level: Not on  file  Occupational History   Not on file  Tobacco Use   Smoking status: Never   Smokeless tobacco: Never  Vaping Use   Vaping Use: Never used  Substance and Sexual Activity   Alcohol use: No   Drug use: No   Sexual activity: Never    Birth control/protection: Post-menopausal  Other Topics Concern   Not on file  Social History Narrative   Not on file   Social Determinants of Health   Financial Resource Strain: Not on file  Food Insecurity: Not on file  Transportation Needs: Not on file  Physical Activity: Not on file  Stress: Not on file  Social Connections: Not on file  Intimate Partner Violence: Not on file   Family History  Problem Relation Age of Onset   Stroke Mother    Pneumonia Father    Heart disease Father    Healthy Son    Glaucoma Daughter    Emphysema Daughter    Healthy Daughter    Other Daughter        had knee replacement   Healthy Son    Heart disease Maternal Grandmother       VITAL SIGNS BP 138/76   Pulse 72   Temp 97.6 F (36.4 C)   Resp 18  Ht '5\' 4"'$  (1.626 m)   Wt 130 lb 12.8 oz (59.3 kg)   SpO2 92%   BMI 22.45 kg/m   Outpatient Encounter Medications as of 08/11/2021  Medication Sig   acetaminophen (TYLENOL) 650 MG CR tablet Take 650 mg by mouth every 8 (eight) hours.    albuterol (PROVENTIL HFA;VENTOLIN HFA) 108 (90 Base) MCG/ACT inhaler Inhale 2 puffs into the lungs at bedtime. For SOB or wheezing   alum & mag hydroxide-simeth (MAALOX PLUS) 400-400-40 MG/5ML suspension Take 30 mLs by mouth every 8 (eight) hours as needed for indigestion.   ANORO ELLIPTA 62.5-25 MCG/INH AEPB Inhale 1 puff into the lungs daily.    apixaban (ELIQUIS) 2.5 MG TABS tablet Take 1 tablet (2.5 mg total) by mouth 2 (two) times daily. Resume 07/20/17   artificial tears (LACRILUBE) OINT ophthalmic ointment Place 1 application into both eyes at bedtime.   Carboxymethylcellulose Sodium (ARTIFICIAL TEARS OP) Apply to eye. Artificial Tears Opthalmic GEL Apply to  both eyes at night. Wait 5 minutes between multiple applications in same eye.  Apply to both eyes daily as needed for irritation/dryness. Wait at least 5 minutes between multiple opth administrations in same eye.   cycloSPORINE (RESTASIS) 0.05 % ophthalmic emulsion Place 1 drop into both eyes every 12 (twelve) hours.   diltiazem (CARDIZEM CD) 180 MG 24 hr capsule TAKE ONE CAPSULE BY MOUTH ONCE DAILY.   furosemide (LASIX) 40 MG tablet Take 40 mg by mouth 2 (two) times daily. Chronic diastolic (congestive) heart failure   gabapentin (NEURONTIN) 100 MG capsule Take 200 mg by mouth 2 (two) times daily. Bilateral Lower Extremity Neuropathic Pain   Melatonin 5 MG TABS Take 5 mg by mouth at bedtime as needed.    Menthol, Topical Analgesic, (BIOFREEZE) 4 % GEL Apply 1 application topically in the morning, at noon, and at bedtime. Apply topically to left shoulder, left knee and neck for M/S pain   NON FORMULARY Diet Type:  Regular   Nutritional Supplements (ENSURE ENLIVE PO) Take 237 mLs by mouth daily.   OXYGEN Inhale 2 L into the lungs at bedtime.    pantoprazole (PROTONIX) 40 MG tablet Take 40 mg by mouth 2 (two) times daily.   Polyethyl Glycol-Propyl Glycol (SYSTANE OP) Place 1 drop into both eyes every 4 (four) hours. For dry eyes  Administer 15 minutes after administering Restasis eye drops.   polyethylene glycol (MIRALAX / GLYCOLAX) packet Take 17 g by mouth daily as needed. For constipation   potassium chloride SA (KLOR-CON) 20 MEQ tablet Take 20 mEq by mouth 2 (two) times daily.   traMADol (ULTRAM) 50 MG tablet Take 0.5 tablets (25 mg total) by mouth every 8 (eight) hours.   Molnupiravir 200 MG CAPS 4 caps = '800mg'$ ; oral Special Instructions: x 5 days covid + Twice A Day (Patient not taking: Reported on 06/28/2021)   [DISCONTINUED] Albuterol (VENTOLIN IN) Inhale into the lungs.     [DISCONTINUED] Ergocalciferol (VITAMIN D2 PO) Take by mouth. 1,250 mcg (50,000 unit); oral Special Instructions:  Give once a week x 2 doses   [DISCONTINUED] furosemide (LASIX) 20 MG tablet Take 20 mg by mouth 2 (two) times daily.    [DISCONTINUED] Multiple Vitamins-Minerals (ZINC) LOZG (vit a-vit c-zinc-propolis) lozenge; oral Special Instructions: Give 5x/day x 2 weeks.   [DISCONTINUED] vitamin C (ASCORBIC ACID) 500 MG tablet Take 500 mg by mouth 2 (two) times daily.   No facility-administered encounter medications on file as of 08/11/2021.     SIGNIFICANT DIAGNOSTIC  EXAMS  PREVIOUS  06-15-21: chest x-ray:  Enlargement  is noted possible with some degree of vascular engorgement Oblate appearance could represent a large pericardial effusion.   NO NEW EXAMS.       LABS REVIEWED: PREVIOUS    09-14-20: wbc 5.7; hgb 13.1; hct 42.3; mcv 84.9 plt 201  10-20-20: glucose 68; bun 22; creat 0.90; k+ 3.9; na++ 138; ca 8.7 liver normal albumin 3.6  05-18-21: wbc 4.5; hgb 12.3; hct 39.6; mcv 86.3 plt 169; glucose 83; bun 21; creat 0.88; k+ 3.7; na++ 138; ca 8.5 GFR 59; liver normal albumin 3.3 06-15-21: wbc 3.7; hgb 13.4 hct 43.3; mcv 86.3 plt 164; glucose 98; bun 16; creat 1.02; k+ 3.6; na++ 140; ca 8.4 GFR 49; d-dimer 0.75; CPR 1.0  TODAY  06-22-21: glucose 72; bun 15; creat 0.88; k+ 3.3; na++ 140; ca 8.1; GFR 59 06-26-21: k+ 3.6      Review of Systems  Constitutional:  Negative for malaise/fatigue.  Respiratory:  Negative for cough and shortness of breath.   Cardiovascular:  Positive for leg swelling. Negative for chest pain and palpitations.  Gastrointestinal:  Negative for abdominal pain, constipation and heartburn.  Musculoskeletal:  Positive for joint pain. Negative for back pain and myalgias.  Skin: Negative.   Neurological:  Negative for dizziness.  Psychiatric/Behavioral:  The patient is not nervous/anxious.      Physical Exam Constitutional:      General: She is not in acute distress.    Appearance: She is well-developed. She is not diaphoretic.  Neck:     Thyroid: No thyromegaly.   Cardiovascular:     Rate and Rhythm: Normal rate and regular rhythm.     Pulses: Normal pulses.     Heart sounds: Normal heart sounds.  Pulmonary:     Effort: Pulmonary effort is normal. No respiratory distress.     Breath sounds: Normal breath sounds.  Abdominal:     General: Bowel sounds are normal. There is no distension.     Palpations: Abdomen is soft.     Tenderness: There is no abdominal tenderness.  Musculoskeletal:        General: Normal range of motion.     Cervical back: Neck supple.     Right lower leg: Edema present.     Left lower leg: Edema present.     Comments:  3+ bilateral lower extremity edema    Lymphadenopathy:     Cervical: No cervical adenopathy.  Skin:    General: Skin is warm and dry.  Neurological:     Mental Status: She is alert and oriented to person, place, and time.  Psychiatric:        Mood and Affect: Mood normal.       ASSESSMENT/ PLAN:  TODAY;   GERD without esophagitis: is stable will continue protonix 40 mg twice daily   2. Chronic generalized pain: is worse will continue tylenol cr 650 mg every 6 hours; ultram 25 mg three times daily will begin voltaren gel 2 gm to both shoulders twice daily   3. CVA (cerebrovascular accident): is stable will continue eliquis 2.5 mg twice daily   4. Complete heart block is status post [employer] PTV/DP pacemaker will monitor   PREVIOUS    5. GERD without esophagitis: is stable will continue protonix 40 mg daily   6. dysipidemia is stable is off lipitor  7. Chronic constipation: is stable will continue miralax daily as needed  8. Chronic diastolic heart failure: EF 60-65% (  06-21-17) is stable will continue lasix 20 mg twice daily   9. Long standing persistent atrial fibrillation: is status post pace maker: will continue cardizem cd 180 mg daily for rate control and eliquis 2.5 mg twice daily   10. 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   11. Aortic atherosclerosis: is stable will monitor   12. Chronic obstructive pulmonary disease is stable 02 dependent will continue 62.5/25 mcg 1 puff daily and albuterol 2 puffs nightly   13. Chronic kidney disease stage 3 (moderate) is stable bun 16 creat 1.02 GFR 49  14. Peripheral neuropathy: is stable will continue gabapentin 200 mg twice daily       Ok Edwards NP [facility]  Contact [phone] Monday through Friday 8am- 5pm  After hours call [phone]

## 2021-09-01 ENCOUNTER — Non-Acute Institutional Stay (SKILLED_NURSING_FACILITY): Payer: Medicare Other | Admitting: Adult Health

## 2021-09-01 ENCOUNTER — Encounter: Payer: Self-pay | Admitting: Adult Health

## 2021-09-01 DIAGNOSIS — I639 Cerebral infarction, unspecified: Secondary | ICD-10-CM | POA: Diagnosis not present

## 2021-09-01 DIAGNOSIS — I700 Atherosclerosis of aorta: Secondary | ICD-10-CM

## 2021-09-01 DIAGNOSIS — I5032 Chronic diastolic (congestive) heart failure: Secondary | ICD-10-CM | POA: Diagnosis not present

## 2021-09-01 NOTE — Progress Notes (Signed)
Location:  [place] Room Number: 108-W Place of Service:  SNF (31)   CODE STATUS: DNR  Allergies  Allergen Reactions   Elemental Sulfur Swelling and Rash   Chief Complaint  Patient presents with   Acute Visit    Care plan meeting       HPI:  We have come together for her care plan meeting. BIMS 15/15 mood 3/30: tires easily. She requires limited to extensive assist with adls. There have been no falls. She is occasionally incontinent of bladder and frequently incontinent of bowel. Therapy: will evaluate for left shoulder pain. Dietary: 130.8 pounds which is down 9 pounds over the past quarter. Is on regular diet appetite is variable. She continues to be followed for her chronic illnesses including:   Aortic atherosclerosis  Cerebrovascular accident (CVA) unspecified mechanism  Chronic diastolic heart failure:  Past Medical History:  Diagnosis Date   Atrial fibrillation (HCC)    Bradycardia    Breast nodule 11/15/2014   Breast pain, left 11/15/2014   Carotid artery disease (HCC)    Dizziness    Dyspnea    Previous CPX suggesting possible restrictive physiology, respiratory muscle fatigue, diastolic dysfunction   Essential hypertension, benign    Hyperlipidemia    Oxygen dependent    2 liter   Pacemaker [employer]    Rib pain on left side 11/15/2014   Seasonal allergies    Shingles 05/04/2015   Stroke ([place])    Toe fracture, left    left big toe    Past Surgical History:  Procedure Laterality Date   COLONOSCOPY  02/22/2012   Procedure: COLONOSCOPY;  Surgeon: Rogene Houston, MD;  [facility];  Service: Endoscopy;  Laterality: N/A;  100   KNEE SURGERY     bilateral   PACEMAKER IMPLANT N/A 07/17/2017   Procedure: Pacemaker Implant;  Surgeon: Deboraha Sprang, MD;  Location: [place] [facility];  Service: Cardiovascular;  Laterality: N/A;    Social History   Socioeconomic History   Marital status: Widowed    Spouse name: Not on file    Number of children: Not on file   Years of education: Not on file   Highest education level: Not on file  Occupational History   Not on file  Tobacco Use   Smoking status: Never   Smokeless tobacco: Never  Vaping Use   Vaping Use: Never used  Substance and Sexual Activity   Alcohol use: No   Drug use: No   Sexual activity: Never    Birth control/protection: Post-menopausal  Other Topics Concern   Not on file  Social History Narrative   Not on file   Social Determinants of Health   Financial Resource Strain: Not on file  Food Insecurity: Not on file  Transportation Needs: Not on file  Physical Activity: Not on file  Stress: Not on file  Social Connections: Not on file  Intimate Partner Violence: Not on file   Family History  Problem Relation Age of Onset   Stroke Mother    Pneumonia Father    Heart disease Father    Healthy Son    Glaucoma Daughter    Emphysema Daughter    Healthy Daughter    Other Daughter        had knee replacement   Healthy Son    Heart disease Maternal Grandmother       VITAL SIGNS BP 122/68   Pulse 72   Temp 97.6 F (  36.4 C)   Resp 18   Ht 5\' 4"  (1.626 m)   Wt 130 lb 12.8 oz (59.3 kg)   SpO2 98%   BMI 22.45 kg/m   Outpatient Encounter Medications as of 09/01/2021  Medication Sig   acetaminophen (TYLENOL) 650 MG CR tablet Take 650 mg by mouth every 8 (eight) hours.    albuterol (PROAIR HFA) 108 (90 Base) MCG/ACT inhaler Inhale 2 puffs into the lungs at bedtime. Wait at least 5 minutes between use of multiple inhalers. [DX: Chronic obstructive pulmonary disease, unspecified]   alum & mag hydroxide-simeth (MAALOX PLUS) 400-400-40 MG/5ML suspension Take 30 mLs by mouth every 8 (eight) hours as needed for indigestion.   ANORO ELLIPTA 62.5-25 MCG/INH AEPB Inhale 1 puff into the lungs daily.    apixaban (ELIQUIS) 2.5 MG TABS tablet Take 1 tablet (2.5 mg total) by mouth 2 (two) times daily. Resume 07/20/17   artificial tears (LACRILUBE)  OINT ophthalmic ointment Place 1 application into both eyes at bedtime.   Carboxymethylcellulose Sodium (ARTIFICIAL TEARS OP) Apply to eye. Artificial Tears Opthalmic GEL Apply to both eyes at night. Wait 5 minutes between multiple applications in same eye.  Apply to both eyes daily as needed for irritation/dryness. Wait at least 5 minutes between multiple opth administrations in same eye.   cycloSPORINE (RESTASIS) 0.05 % ophthalmic emulsion Place 1 drop into both eyes every 12 (twelve) hours.   diltiazem (CARDIZEM CD) 180 MG 24 hr capsule TAKE ONE CAPSULE BY MOUTH ONCE DAILY.   furosemide (LASIX) 40 MG tablet Take 40 mg by mouth 2 (two) times daily. Chronic diastolic (congestive) heart failure   gabapentin (NEURONTIN) 100 MG capsule Take 200 mg by mouth 2 (two) times daily. Bilateral Lower Extremity Neuropathic Pain   Melatonin 5 MG TABS Take 5 mg by mouth at bedtime as needed.    NON FORMULARY Diet Type:  Regular   Nutritional Supplements (ENSURE ENLIVE PO) Take 237 mLs by mouth daily.   OXYGEN Inhale 2 L into the lungs at bedtime.    pantoprazole (PROTONIX) 40 MG tablet Take 40 mg by mouth 2 (two) times daily.   Polyethyl Glycol-Propyl Glycol (SYSTANE OP) Place 1 drop into both eyes every 4 (four) hours. For dry eyes  Administer 15 minutes after administering Restasis eye drops.   polyethylene glycol (MIRALAX / GLYCOLAX) packet Take 17 g by mouth daily as needed. For constipation   potassium chloride SA (KLOR-CON) 20 MEQ tablet Take 20 mEq by mouth 2 (two) times daily.   traMADol (ULTRAM) 50 MG tablet Take 0.5 tablets (25 mg total) by mouth every 8 (eight) hours.   [DISCONTINUED] albuterol (PROVENTIL HFA;VENTOLIN HFA) 108 (90 Base) MCG/ACT inhaler Inhale 2 puffs into the lungs at bedtime. For SOB or wheezing   [DISCONTINUED] Albuterol (VENTOLIN IN) Inhale into the lungs.     [DISCONTINUED] Menthol, Topical Analgesic, (BIOFREEZE) 4 % GEL Apply 1 application topically in the morning, at noon,  and at bedtime. Apply topically to left shoulder, left knee and neck for M/S pain   [DISCONTINUED] Molnupiravir 200 MG CAPS 4 caps = 800mg ; oral Special Instructions: x 5 days covid + Twice A Day (Patient not taking: Reported on 06/28/2021)   No facility-administered encounter medications on file as of 09/01/2021.     SIGNIFICANT DIAGNOSTIC EXAMS  PREVIOUS  06-15-21: chest x-ray:  Enlargement  is noted possible with some degree of vascular engorgement Oblate appearance could represent a large pericardial effusion.   NO NEW EXAMS.  LABS REVIEWED: PREVIOUS    09-14-20: wbc 5.7; hgb 13.1; hct 42.3; mcv 84.9 plt 201  10-20-20: glucose 68; bun 22; creat 0.90; k+ 3.9; na++ 138; ca 8.7 liver normal albumin 3.6  05-18-21: wbc 4.5; hgb 12.3; hct 39.6; mcv 86.3 plt 169; glucose 83; bun 21; creat 0.88; k+ 3.7; na++ 138; ca 8.5 GFR 59; liver normal albumin 3.3 06-15-21: wbc 3.7; hgb 13.4 hct 43.3; mcv 86.3 plt 164; glucose 98; bun 16; creat 1.02; k+ 3.6; na++ 140; ca 8.4 GFR 49; d-dimer 0.75; CPR 1.0 06-22-21: glucose 72; bun 15; creat 0.88; k+ 3.3; na++ 140; ca 8.1; GFR 59 06-26-21: k+ 3.6   NO NEW LABS.     Review of Systems  Constitutional:  Negative for malaise/fatigue.  Respiratory:  Negative for cough and shortness of breath.   Cardiovascular:  Positive for leg swelling. Negative for chest pain and palpitations.  Gastrointestinal:  Negative for abdominal pain, constipation and heartburn.  Musculoskeletal:  Positive for joint pain. Negative for back pain and myalgias.  Skin: Negative.   Neurological:  Negative for dizziness.  Psychiatric/Behavioral:  The patient is not nervous/anxious.       Physical Exam Constitutional:      General: She is not in acute distress.    Appearance: She is well-developed. She is not diaphoretic.  Neck:     Thyroid: No thyromegaly.  Cardiovascular:     Rate and Rhythm: Normal rate and regular rhythm.     Pulses: Normal pulses.     Heart sounds:  Normal heart sounds.  Pulmonary:     Effort: Pulmonary effort is normal. No respiratory distress.     Breath sounds: Normal breath sounds.  Abdominal:     General: Bowel sounds are normal. There is no distension.     Palpations: Abdomen is soft.     Tenderness: There is no abdominal tenderness.  Musculoskeletal:        General: Normal range of motion.     Cervical back: Neck supple.     Right lower leg: Edema present.     Left lower leg: Edema present.     Comments:   3+ bilateral lower extremity edema  Lymphadenopathy:     Cervical: No cervical adenopathy.  Skin:    General: Skin is warm and dry.  Neurological:     Mental Status: She is alert and oriented to person, place, and time.  Psychiatric:        Mood and Affect: Mood normal.     ASSESSMENT/ PLAN:  TODAY  Aortic atherosclerosis Cerebrovascular accident (CVA) unspecified mechanism Chronic diastolic heart failure:   Will continue current medications Therapy: left arm pain management  Will continue to monitor her status.   Time spent with patient: 40 minutes: medications plan of care therapy needs    Ok Edwards NP [facility]  Contact [phone] Monday through Friday 8am- 5pm  After hours call [phone]

## 2021-09-05 DIAGNOSIS — I63312 Cerebral infarction due to thrombosis of left middle cerebral artery: Secondary | ICD-10-CM | POA: Diagnosis not present

## 2021-09-05 DIAGNOSIS — M1990 Unspecified osteoarthritis, unspecified site: Secondary | ICD-10-CM | POA: Diagnosis not present

## 2021-09-05 DIAGNOSIS — M25512 Pain in left shoulder: Secondary | ICD-10-CM | POA: Diagnosis not present

## 2021-09-05 DIAGNOSIS — Z741 Need for assistance with personal care: Secondary | ICD-10-CM | POA: Diagnosis not present

## 2021-09-05 DIAGNOSIS — M25511 Pain in right shoulder: Secondary | ICD-10-CM | POA: Diagnosis not present

## 2021-09-07 DIAGNOSIS — I63312 Cerebral infarction due to thrombosis of left middle cerebral artery: Secondary | ICD-10-CM | POA: Diagnosis not present

## 2021-09-07 DIAGNOSIS — Z741 Need for assistance with personal care: Secondary | ICD-10-CM | POA: Diagnosis not present

## 2021-09-07 DIAGNOSIS — M25511 Pain in right shoulder: Secondary | ICD-10-CM | POA: Diagnosis not present

## 2021-09-07 DIAGNOSIS — M25512 Pain in left shoulder: Secondary | ICD-10-CM | POA: Diagnosis not present

## 2021-09-07 DIAGNOSIS — M1990 Unspecified osteoarthritis, unspecified site: Secondary | ICD-10-CM | POA: Diagnosis not present

## 2021-09-08 DIAGNOSIS — I63312 Cerebral infarction due to thrombosis of left middle cerebral artery: Secondary | ICD-10-CM | POA: Diagnosis not present

## 2021-09-08 DIAGNOSIS — M25512 Pain in left shoulder: Secondary | ICD-10-CM | POA: Diagnosis not present

## 2021-09-08 DIAGNOSIS — M25511 Pain in right shoulder: Secondary | ICD-10-CM | POA: Diagnosis not present

## 2021-09-08 DIAGNOSIS — M1990 Unspecified osteoarthritis, unspecified site: Secondary | ICD-10-CM | POA: Diagnosis not present

## 2021-09-08 DIAGNOSIS — Z741 Need for assistance with personal care: Secondary | ICD-10-CM | POA: Diagnosis not present

## 2021-09-11 ENCOUNTER — Other Ambulatory Visit: Payer: Self-pay | Admitting: Adult Health

## 2021-09-11 DIAGNOSIS — I63312 Cerebral infarction due to thrombosis of left middle cerebral artery: Secondary | ICD-10-CM | POA: Diagnosis not present

## 2021-09-11 DIAGNOSIS — Z741 Need for assistance with personal care: Secondary | ICD-10-CM | POA: Diagnosis not present

## 2021-09-11 DIAGNOSIS — M25512 Pain in left shoulder: Secondary | ICD-10-CM | POA: Diagnosis not present

## 2021-09-11 DIAGNOSIS — M25511 Pain in right shoulder: Secondary | ICD-10-CM | POA: Diagnosis not present

## 2021-09-11 DIAGNOSIS — M1990 Unspecified osteoarthritis, unspecified site: Secondary | ICD-10-CM | POA: Diagnosis not present

## 2021-09-11 MED ORDER — TRAMADOL HCL 50 MG PO TABS: 25.0000 mg | ORAL_TABLET | Freq: Three times a day (TID) | ORAL | 0 refills | Status: DC

## 2021-09-12 ENCOUNTER — Non-Acute Institutional Stay (SKILLED_NURSING_FACILITY): Payer: Medicare Other | Admitting: Adult Health

## 2021-09-12 ENCOUNTER — Encounter: Payer: Self-pay | Admitting: Adult Health

## 2021-09-12 DIAGNOSIS — K5909 Other constipation: Secondary | ICD-10-CM

## 2021-09-12 DIAGNOSIS — I63312 Cerebral infarction due to thrombosis of left middle cerebral artery: Secondary | ICD-10-CM | POA: Diagnosis not present

## 2021-09-12 DIAGNOSIS — Z741 Need for assistance with personal care: Secondary | ICD-10-CM | POA: Diagnosis not present

## 2021-09-12 DIAGNOSIS — M1990 Unspecified osteoarthritis, unspecified site: Secondary | ICD-10-CM | POA: Diagnosis not present

## 2021-09-12 DIAGNOSIS — E785 Hyperlipidemia, unspecified: Secondary | ICD-10-CM | POA: Diagnosis not present

## 2021-09-12 DIAGNOSIS — M25512 Pain in left shoulder: Secondary | ICD-10-CM | POA: Diagnosis not present

## 2021-09-12 DIAGNOSIS — I5032 Chronic diastolic (congestive) heart failure: Secondary | ICD-10-CM | POA: Diagnosis not present

## 2021-09-12 DIAGNOSIS — K219 Gastro-esophageal reflux disease without esophagitis: Secondary | ICD-10-CM | POA: Diagnosis not present

## 2021-09-12 DIAGNOSIS — M25511 Pain in right shoulder: Secondary | ICD-10-CM | POA: Diagnosis not present

## 2021-09-12 NOTE — Progress Notes (Signed)
Location:  [facility] Room Number: 108 Place of Service:  SNF (31)   CODE STATUS: dnr  Allergies  Allergen Reactions   Elemental Sulfur Swelling and Rash    Chief Complaint  Patient presents with   Medical Management of Chronic Issues             GERD without esophagitis  Dyslipidemia   Chronic constipation:  Chronic diastolic heart failure    HPI:  She is a 87 year old long term resident of this facility being seen for the management of her chronic illnesses: GERD without esophagitis  Dyslipidemia   Chronic constipation:  Chronic diastolic heart failure. There are no reports of uncontrolled pain; no changes in appetite weight is stable.   Past Medical History:  Diagnosis Date   Atrial fibrillation (HCC)    Bradycardia    Breast nodule 11/15/2014   Breast pain, left 11/15/2014   Carotid artery disease (HCC)    Dizziness    Dyspnea    Previous CPX suggesting possible restrictive physiology, respiratory muscle fatigue, diastolic dysfunction   Essential hypertension, benign    Hyperlipidemia    Oxygen dependent    2 liter   Pacemaker [employer]    Rib pain on left side 11/15/2014   Seasonal allergies    Shingles 05/04/2015   Stroke ([place])    Toe fracture, left    left big toe    Past Surgical History:  Procedure Laterality Date   COLONOSCOPY  02/22/2012   Procedure: COLONOSCOPY;  Surgeon: Rogene Houston, MD;  [facility];  Service: Endoscopy;  Laterality: N/A;  100   KNEE SURGERY     bilateral   PACEMAKER IMPLANT N/A 07/17/2017   Procedure: Pacemaker Implant;  Surgeon: Deboraha Sprang, MD;  Location: [facility];  Service: Cardiovascular;  Laterality: N/A;    Social History   Socioeconomic History   Marital status: Widowed    Spouse name: Not on file   Number of children: Not on file   Years of education: Not on file   Highest education level: Not on file  Occupational History   Not on file  Tobacco Use   Smoking  status: Never   Smokeless tobacco: Never  Vaping Use   Vaping Use: Never used  Substance and Sexual Activity   Alcohol use: No   Drug use: No   Sexual activity: Never    Birth control/protection: Post-menopausal  Other Topics Concern   Not on file  Social History Narrative   Not on file   Social Determinants of Health   Financial Resource Strain: Not on file  Food Insecurity: Not on file  Transportation Needs: Not on file  Physical Activity: Not on file  Stress: Not on file  Social Connections: Not on file  Intimate Partner Violence: Not on file   Family History  Problem Relation Age of Onset   Stroke Mother    Pneumonia Father    Heart disease Father    Healthy Son    Glaucoma Daughter    Emphysema Daughter    Healthy Daughter    Other Daughter        had knee replacement   Healthy Son    Heart disease Maternal Grandmother       VITAL SIGNS BP 135/72   Pulse 68   Temp 98.6 F (37 C)   Resp 18   Ht 5\' 4"  (1.626 m)   Wt 130 lb  12.8 oz (59.3 kg)   SpO2 98%   BMI 22.45 kg/m   Outpatient Encounter Medications as of 09/12/2021  Medication Sig   acetaminophen (TYLENOL) 650 MG CR tablet Take 650 mg by mouth every 8 (eight) hours.    albuterol (PROAIR HFA) 108 (90 Base) MCG/ACT inhaler Inhale 2 puffs into the lungs at bedtime. Wait at least 5 minutes between use of multiple inhalers. [DX: Chronic obstructive pulmonary disease, unspecified]   alum & mag hydroxide-simeth (MAALOX PLUS) 400-400-40 MG/5ML suspension Take 30 mLs by mouth every 8 (eight) hours as needed for indigestion.   ANORO ELLIPTA 62.5-25 MCG/INH AEPB Inhale 1 puff into the lungs daily.    apixaban (ELIQUIS) 2.5 MG TABS tablet Take 1 tablet (2.5 mg total) by mouth 2 (two) times daily. Resume 07/20/17   artificial tears (LACRILUBE) OINT ophthalmic ointment Place 1 application into both eyes at bedtime.   Carboxymethylcellulose Sodium (ARTIFICIAL TEARS OP) Apply to eye. Artificial Tears Opthalmic  GEL Apply to both eyes at night. Wait 5 minutes between multiple applications in same eye.  Apply to both eyes daily as needed for irritation/dryness. Wait at least 5 minutes between multiple opth administrations in same eye.   cycloSPORINE (RESTASIS) 0.05 % ophthalmic emulsion Place 1 drop into both eyes every 12 (twelve) hours.   diltiazem (CARDIZEM CD) 180 MG 24 hr capsule TAKE ONE CAPSULE BY MOUTH ONCE DAILY.   furosemide (LASIX) 40 MG tablet Take 40 mg by mouth 2 (two) times daily. Chronic diastolic (congestive) heart failure   gabapentin (NEURONTIN) 100 MG capsule Take 200 mg by mouth 2 (two) times daily. Bilateral Lower Extremity Neuropathic Pain   Melatonin 5 MG TABS Take 5 mg by mouth at bedtime as needed.    NON FORMULARY Diet Type:  Regular   Nutritional Supplements (ENSURE ENLIVE PO) Take 237 mLs by mouth daily.   OXYGEN Inhale 2 L into the lungs at bedtime.    pantoprazole (PROTONIX) 40 MG tablet Take 40 mg by mouth 2 (two) times daily.   Polyethyl Glycol-Propyl Glycol (SYSTANE OP) Place 1 drop into both eyes every 4 (four) hours. For dry eyes  Administer 15 minutes after administering Restasis eye drops.   polyethylene glycol (MIRALAX / GLYCOLAX) packet Take 17 g by mouth daily as needed. For constipation   potassium chloride SA (KLOR-CON) 20 MEQ tablet Take 20 mEq by mouth 2 (two) times daily.   traMADol (ULTRAM) 50 MG tablet Take 0.5 tablets (25 mg total) by mouth every 8 (eight) hours.   [DISCONTINUED] Albuterol (VENTOLIN IN) Inhale into the lungs.     No facility-administered encounter medications on file as of 09/12/2021.     SIGNIFICANT DIAGNOSTIC EXAMS  PREVIOUS  06-15-21: chest x-ray:  Enlargement  is noted possible with some degree of vascular engorgement Oblate appearance could represent a large pericardial effusion.   NO NEW EXAMS.       LABS REVIEWED: PREVIOUS    09-14-20: wbc 5.7; hgb 13.1; hct 42.3; mcv 84.9 plt 201  10-20-20: glucose 68; bun 22; creat  0.90; k+ 3.9; na++ 138; ca 8.7 liver normal albumin 3.6  05-18-21: wbc 4.5; hgb 12.3; hct 39.6; mcv 86.3 plt 169; glucose 83; bun 21; creat 0.88; k+ 3.7; na++ 138; ca 8.5 GFR 59; liver normal albumin 3.3 06-15-21: wbc 3.7; hgb 13.4 hct 43.3; mcv 86.3 plt 164; glucose 98; bun 16; creat 1.02; k+ 3.6; na++ 140; ca 8.4 GFR 49; d-dimer 0.75; CPR 1.0 06-22-21: glucose 72; bun 15; creat 0.88;  k+ 3.3; na++ 140; ca 8.1; GFR 59 06-26-21: k+ 3.6     NO NEW LABS.   Review of Systems  Constitutional:  Negative for malaise/fatigue.  Respiratory:  Negative for cough and shortness of breath.   Cardiovascular:  Positive for leg swelling. Negative for chest pain and palpitations.  Gastrointestinal:  Negative for abdominal pain, constipation and heartburn.  Musculoskeletal:  Positive for joint pain. Negative for back pain and myalgias.  Skin: Negative.   Neurological:  Negative for dizziness.  Psychiatric/Behavioral:  The patient is not nervous/anxious.    Physical Exam Constitutional:      General: She is not in acute distress.    Appearance: She is well-developed. She is not diaphoretic.  Neck:     Thyroid: No thyromegaly.  Cardiovascular:     Rate and Rhythm: Normal rate and regular rhythm.     Pulses: Normal pulses.     Heart sounds: Normal heart sounds.  Pulmonary:     Effort: Pulmonary effort is normal. No respiratory distress.     Breath sounds: Normal breath sounds.  Abdominal:     General: Bowel sounds are normal. There is no distension.     Palpations: Abdomen is soft.     Tenderness: There is no abdominal tenderness.  Musculoskeletal:        General: Normal range of motion.     Cervical back: Neck supple.     Right lower leg: Edema present.     Left lower leg: Edema present.     Comments:   3+ bilateral lower extremity edema   Lymphadenopathy:     Cervical: No cervical adenopathy.  Skin:    General: Skin is warm and dry.  Neurological:     Mental Status: She is alert and oriented to  person, place, and time.  Psychiatric:        Mood and Affect: Mood normal.     ASSESSMENT/ PLAN:  TODAY;   GERD without esophagitis is stable will continue protonix 40 mg twice daily   2. Dyslipidemia is stable is off lipitor  3. Chronic constipation: is stable will continue miralax daily as needed  4. Chronic diastolic heart failure EF 60-65% (06-21-17) is stable will continue lasix 20 mg twice daily   PREVIOUS   5. Long standing persistent atrial fibrillation: is status post pace maker: will continue cardizem cd 180 mg daily for rate control and eliquis 2.5 mg twice daily   6. Hypertensive heart and kidney disease with chronic diastolic congestive heart failure and stage 3 chronic kidney disease is stable b/p 138/64 will continue cardizem cd 180 mg daily is off norvasc.   7. Aortic atherosclerosis: is stable will monitor   8. Chronic obstructive pulmonary disease is stable 02 dependent will continue 62.5/25 mcg 1 puff daily and albuterol 2 puffs nightly   9. Chronic kidney disease stage 3 (moderate) is stable bun 16 creat 1.02 GFR 49  10. Peripheral neuropathy: is stable will continue gabapentin 200 mg twice daily   11. GERD without esophagitis: is stable will continue protonix 40 mg twice daily   12. Chronic generalized pain: is worse will continue tylenol cr 650 mg every 6 hours; ultram 25 mg three times daily will begin voltaren gel 2 gm to both shoulders twice daily   13. CVA (cerebrovascular accident): is stable will continue eliquis 2.5 mg twice daily   14. Complete heart block is status post [employer] PTV/DP pacemaker will monitor  Ok Edwards NP [facility]  Contact [phone] Monday through Friday 8am- 5pm  After hours call [phone]

## 2021-09-13 DIAGNOSIS — M25512 Pain in left shoulder: Secondary | ICD-10-CM | POA: Diagnosis not present

## 2021-09-13 DIAGNOSIS — Z741 Need for assistance with personal care: Secondary | ICD-10-CM | POA: Diagnosis not present

## 2021-09-13 DIAGNOSIS — M25511 Pain in right shoulder: Secondary | ICD-10-CM | POA: Diagnosis not present

## 2021-09-13 DIAGNOSIS — Z23 Encounter for immunization: Secondary | ICD-10-CM | POA: Diagnosis not present

## 2021-09-13 DIAGNOSIS — M1990 Unspecified osteoarthritis, unspecified site: Secondary | ICD-10-CM | POA: Diagnosis not present

## 2021-09-13 DIAGNOSIS — I63312 Cerebral infarction due to thrombosis of left middle cerebral artery: Secondary | ICD-10-CM | POA: Diagnosis not present

## 2021-09-14 DIAGNOSIS — Z741 Need for assistance with personal care: Secondary | ICD-10-CM | POA: Diagnosis not present

## 2021-09-14 DIAGNOSIS — M25511 Pain in right shoulder: Secondary | ICD-10-CM | POA: Diagnosis not present

## 2021-09-14 DIAGNOSIS — M1990 Unspecified osteoarthritis, unspecified site: Secondary | ICD-10-CM | POA: Diagnosis not present

## 2021-09-14 DIAGNOSIS — I63312 Cerebral infarction due to thrombosis of left middle cerebral artery: Secondary | ICD-10-CM | POA: Diagnosis not present

## 2021-09-14 DIAGNOSIS — M25512 Pain in left shoulder: Secondary | ICD-10-CM | POA: Diagnosis not present

## 2021-09-15 DIAGNOSIS — M25512 Pain in left shoulder: Secondary | ICD-10-CM | POA: Diagnosis not present

## 2021-09-15 DIAGNOSIS — Z23 Encounter for immunization: Secondary | ICD-10-CM | POA: Diagnosis not present

## 2021-09-15 DIAGNOSIS — Z741 Need for assistance with personal care: Secondary | ICD-10-CM | POA: Diagnosis not present

## 2021-09-15 DIAGNOSIS — M25511 Pain in right shoulder: Secondary | ICD-10-CM | POA: Diagnosis not present

## 2021-09-15 DIAGNOSIS — I63312 Cerebral infarction due to thrombosis of left middle cerebral artery: Secondary | ICD-10-CM | POA: Diagnosis not present

## 2021-09-15 DIAGNOSIS — M1990 Unspecified osteoarthritis, unspecified site: Secondary | ICD-10-CM | POA: Diagnosis not present

## 2021-09-18 DIAGNOSIS — Z741 Need for assistance with personal care: Secondary | ICD-10-CM | POA: Diagnosis not present

## 2021-09-18 DIAGNOSIS — M1990 Unspecified osteoarthritis, unspecified site: Secondary | ICD-10-CM | POA: Diagnosis not present

## 2021-09-18 DIAGNOSIS — M25511 Pain in right shoulder: Secondary | ICD-10-CM | POA: Diagnosis not present

## 2021-09-18 DIAGNOSIS — M25512 Pain in left shoulder: Secondary | ICD-10-CM | POA: Diagnosis not present

## 2021-09-18 DIAGNOSIS — I63312 Cerebral infarction due to thrombosis of left middle cerebral artery: Secondary | ICD-10-CM | POA: Diagnosis not present

## 2021-09-19 DIAGNOSIS — I63312 Cerebral infarction due to thrombosis of left middle cerebral artery: Secondary | ICD-10-CM | POA: Diagnosis not present

## 2021-09-19 DIAGNOSIS — M25511 Pain in right shoulder: Secondary | ICD-10-CM | POA: Diagnosis not present

## 2021-09-19 DIAGNOSIS — M25512 Pain in left shoulder: Secondary | ICD-10-CM | POA: Diagnosis not present

## 2021-09-19 DIAGNOSIS — M1990 Unspecified osteoarthritis, unspecified site: Secondary | ICD-10-CM | POA: Diagnosis not present

## 2021-09-19 DIAGNOSIS — Z741 Need for assistance with personal care: Secondary | ICD-10-CM | POA: Diagnosis not present

## 2021-09-20 DIAGNOSIS — M1990 Unspecified osteoarthritis, unspecified site: Secondary | ICD-10-CM | POA: Diagnosis not present

## 2021-09-20 DIAGNOSIS — I63312 Cerebral infarction due to thrombosis of left middle cerebral artery: Secondary | ICD-10-CM | POA: Diagnosis not present

## 2021-09-20 DIAGNOSIS — M25512 Pain in left shoulder: Secondary | ICD-10-CM | POA: Diagnosis not present

## 2021-09-20 DIAGNOSIS — M25511 Pain in right shoulder: Secondary | ICD-10-CM | POA: Diagnosis not present

## 2021-09-20 DIAGNOSIS — Z741 Need for assistance with personal care: Secondary | ICD-10-CM | POA: Diagnosis not present

## 2021-09-21 DIAGNOSIS — M1990 Unspecified osteoarthritis, unspecified site: Secondary | ICD-10-CM | POA: Diagnosis not present

## 2021-09-21 DIAGNOSIS — M25511 Pain in right shoulder: Secondary | ICD-10-CM | POA: Diagnosis not present

## 2021-09-21 DIAGNOSIS — M25512 Pain in left shoulder: Secondary | ICD-10-CM | POA: Diagnosis not present

## 2021-09-21 DIAGNOSIS — Z741 Need for assistance with personal care: Secondary | ICD-10-CM | POA: Diagnosis not present

## 2021-09-21 DIAGNOSIS — I63312 Cerebral infarction due to thrombosis of left middle cerebral artery: Secondary | ICD-10-CM | POA: Diagnosis not present

## 2021-09-22 DIAGNOSIS — M25511 Pain in right shoulder: Secondary | ICD-10-CM | POA: Diagnosis not present

## 2021-09-22 DIAGNOSIS — I63312 Cerebral infarction due to thrombosis of left middle cerebral artery: Secondary | ICD-10-CM | POA: Diagnosis not present

## 2021-09-22 DIAGNOSIS — Z741 Need for assistance with personal care: Secondary | ICD-10-CM | POA: Diagnosis not present

## 2021-09-22 DIAGNOSIS — M1990 Unspecified osteoarthritis, unspecified site: Secondary | ICD-10-CM | POA: Diagnosis not present

## 2021-09-22 DIAGNOSIS — M25512 Pain in left shoulder: Secondary | ICD-10-CM | POA: Diagnosis not present

## 2021-09-25 DIAGNOSIS — M25512 Pain in left shoulder: Secondary | ICD-10-CM | POA: Diagnosis not present

## 2021-09-25 DIAGNOSIS — I63312 Cerebral infarction due to thrombosis of left middle cerebral artery: Secondary | ICD-10-CM | POA: Diagnosis not present

## 2021-09-25 DIAGNOSIS — Z741 Need for assistance with personal care: Secondary | ICD-10-CM | POA: Diagnosis not present

## 2021-09-25 DIAGNOSIS — M1990 Unspecified osteoarthritis, unspecified site: Secondary | ICD-10-CM | POA: Diagnosis not present

## 2021-09-25 DIAGNOSIS — M25511 Pain in right shoulder: Secondary | ICD-10-CM | POA: Diagnosis not present

## 2021-09-26 DIAGNOSIS — I63312 Cerebral infarction due to thrombosis of left middle cerebral artery: Secondary | ICD-10-CM | POA: Diagnosis not present

## 2021-09-26 DIAGNOSIS — M25511 Pain in right shoulder: Secondary | ICD-10-CM | POA: Diagnosis not present

## 2021-09-26 DIAGNOSIS — Z741 Need for assistance with personal care: Secondary | ICD-10-CM | POA: Diagnosis not present

## 2021-09-26 DIAGNOSIS — M25512 Pain in left shoulder: Secondary | ICD-10-CM | POA: Diagnosis not present

## 2021-09-26 DIAGNOSIS — M1990 Unspecified osteoarthritis, unspecified site: Secondary | ICD-10-CM | POA: Diagnosis not present

## 2021-09-28 DIAGNOSIS — M25512 Pain in left shoulder: Secondary | ICD-10-CM | POA: Diagnosis not present

## 2021-09-28 DIAGNOSIS — I63312 Cerebral infarction due to thrombosis of left middle cerebral artery: Secondary | ICD-10-CM | POA: Diagnosis not present

## 2021-09-28 DIAGNOSIS — M25511 Pain in right shoulder: Secondary | ICD-10-CM | POA: Diagnosis not present

## 2021-09-28 DIAGNOSIS — M1990 Unspecified osteoarthritis, unspecified site: Secondary | ICD-10-CM | POA: Diagnosis not present

## 2021-09-28 DIAGNOSIS — Z741 Need for assistance with personal care: Secondary | ICD-10-CM | POA: Diagnosis not present

## 2021-09-29 DIAGNOSIS — M25511 Pain in right shoulder: Secondary | ICD-10-CM | POA: Diagnosis not present

## 2021-09-29 DIAGNOSIS — M1990 Unspecified osteoarthritis, unspecified site: Secondary | ICD-10-CM | POA: Diagnosis not present

## 2021-09-29 DIAGNOSIS — Z741 Need for assistance with personal care: Secondary | ICD-10-CM | POA: Diagnosis not present

## 2021-09-29 DIAGNOSIS — I63312 Cerebral infarction due to thrombosis of left middle cerebral artery: Secondary | ICD-10-CM | POA: Diagnosis not present

## 2021-09-29 DIAGNOSIS — M25512 Pain in left shoulder: Secondary | ICD-10-CM | POA: Diagnosis not present

## 2021-10-02 DIAGNOSIS — M25512 Pain in left shoulder: Secondary | ICD-10-CM | POA: Diagnosis not present

## 2021-10-02 DIAGNOSIS — I63312 Cerebral infarction due to thrombosis of left middle cerebral artery: Secondary | ICD-10-CM | POA: Diagnosis not present

## 2021-10-02 DIAGNOSIS — M25511 Pain in right shoulder: Secondary | ICD-10-CM | POA: Diagnosis not present

## 2021-10-02 DIAGNOSIS — Z741 Need for assistance with personal care: Secondary | ICD-10-CM | POA: Diagnosis not present

## 2021-10-02 DIAGNOSIS — M1990 Unspecified osteoarthritis, unspecified site: Secondary | ICD-10-CM | POA: Diagnosis not present

## 2021-10-03 DIAGNOSIS — M25512 Pain in left shoulder: Secondary | ICD-10-CM | POA: Diagnosis not present

## 2021-10-03 DIAGNOSIS — Z741 Need for assistance with personal care: Secondary | ICD-10-CM | POA: Diagnosis not present

## 2021-10-03 DIAGNOSIS — M25511 Pain in right shoulder: Secondary | ICD-10-CM | POA: Diagnosis not present

## 2021-10-03 DIAGNOSIS — M1990 Unspecified osteoarthritis, unspecified site: Secondary | ICD-10-CM | POA: Diagnosis not present

## 2021-10-03 DIAGNOSIS — I63312 Cerebral infarction due to thrombosis of left middle cerebral artery: Secondary | ICD-10-CM | POA: Diagnosis not present

## 2021-10-04 DIAGNOSIS — Z741 Need for assistance with personal care: Secondary | ICD-10-CM | POA: Diagnosis not present

## 2021-10-04 DIAGNOSIS — I63312 Cerebral infarction due to thrombosis of left middle cerebral artery: Secondary | ICD-10-CM | POA: Diagnosis not present

## 2021-10-04 DIAGNOSIS — M25512 Pain in left shoulder: Secondary | ICD-10-CM | POA: Diagnosis not present

## 2021-10-04 DIAGNOSIS — M25511 Pain in right shoulder: Secondary | ICD-10-CM | POA: Diagnosis not present

## 2021-10-04 DIAGNOSIS — Z1159 Encounter for screening for other viral diseases: Secondary | ICD-10-CM | POA: Diagnosis not present

## 2021-10-04 DIAGNOSIS — I130 Hypertensive heart and chronic kidney disease with heart failure and stage 1 through stage 4 chronic kidney disease, or unspecified chronic kidney disease: Secondary | ICD-10-CM | POA: Diagnosis not present

## 2021-10-04 DIAGNOSIS — M1990 Unspecified osteoarthritis, unspecified site: Secondary | ICD-10-CM | POA: Diagnosis not present

## 2021-10-10 ENCOUNTER — Other Ambulatory Visit: Payer: Self-pay | Admitting: Adult Health

## 2021-10-10 MED ORDER — TRAMADOL HCL 50 MG PO TABS: 25.0000 mg | ORAL_TABLET | Freq: Three times a day (TID) | ORAL | 0 refills | Status: DC

## 2021-10-13 ENCOUNTER — Encounter: Payer: Self-pay | Admitting: Adult Health

## 2021-10-13 ENCOUNTER — Non-Acute Institutional Stay (SKILLED_NURSING_FACILITY): Payer: Medicare Other | Admitting: Adult Health

## 2021-10-13 DIAGNOSIS — N1832 Chronic kidney disease, stage 3b: Secondary | ICD-10-CM | POA: Diagnosis not present

## 2021-10-13 DIAGNOSIS — I5032 Chronic diastolic (congestive) heart failure: Secondary | ICD-10-CM

## 2021-10-13 DIAGNOSIS — I4811 Longstanding persistent atrial fibrillation: Secondary | ICD-10-CM

## 2021-10-13 DIAGNOSIS — J449 Chronic obstructive pulmonary disease, unspecified: Secondary | ICD-10-CM | POA: Diagnosis not present

## 2021-10-13 DIAGNOSIS — I130 Hypertensive heart and chronic kidney disease with heart failure and stage 1 through stage 4 chronic kidney disease, or unspecified chronic kidney disease: Secondary | ICD-10-CM

## 2021-10-13 DIAGNOSIS — I700 Atherosclerosis of aorta: Secondary | ICD-10-CM | POA: Diagnosis not present

## 2021-10-13 NOTE — Progress Notes (Signed)
Location:  [facility] Room Number: 81 Place of Service:  SNF (31)   CODE STATUS: dnr   Allergies  Allergen Reactions   Elemental Sulfur Swelling and Rash    Chief Complaint  Patient presents with   Medical Management of Chronic Issues             Long standing persistent atrial fibrillation: Hypertensive heart and kidney disease with chronic diastolic congestive heart failure and stage 3 chronic kidney disease:  Aortic atherosclerosis  Chronic obstructive pulmonary disease    HPI:  She is a 87 year old long term resident of this facility being seen for the management of her chronic illnesses: Long standing persistent atrial fibrillation: Hypertensive heart and kidney disease with chronic diastolic congestive heart failure and stage 3 chronic kidney disease:  Aortic atherosclerosis  Chronic obstructive pulmonary disease. There are no reports of uncontrolled pain. No changes in appetite; no reports of anxiety or depressive thoughts.   Past Medical History:  Diagnosis Date   Atrial fibrillation (HCC)    Bradycardia    Breast nodule 11/15/2014   Breast pain, left 11/15/2014   Carotid artery disease (HCC)    Dizziness    Dyspnea    Previous CPX suggesting possible restrictive physiology, respiratory muscle fatigue, diastolic dysfunction   Essential hypertension, benign    Hyperlipidemia    Oxygen dependent    2 liter   Pacemaker [employer]    Rib pain on left side 11/15/2014   Seasonal allergies    Shingles 05/04/2015   Stroke ([place])    Toe fracture, left    left big toe    Past Surgical History:  Procedure Laterality Date   COLONOSCOPY  02/22/2012   Procedure: COLONOSCOPY;  Surgeon: Rogene Houston, MD;  [facility];  Service: Endoscopy;  Laterality: N/A;  100   KNEE SURGERY     bilateral   PACEMAKER IMPLANT N/A 07/17/2017   Procedure: Pacemaker Implant;  Surgeon: Deboraha Sprang, MD;  Location: [facility];  Service:  Cardiovascular;  Laterality: N/A;    Social History   Socioeconomic History   Marital status: Widowed    Spouse name: Not on file   Number of children: Not on file   Years of education: Not on file   Highest education level: Not on file  Occupational History   Not on file  Tobacco Use   Smoking status: Never   Smokeless tobacco: Never  Vaping Use   Vaping Use: Never used  Substance and Sexual Activity   Alcohol use: No   Drug use: No   Sexual activity: Never    Birth control/protection: Post-menopausal  Other Topics Concern   Not on file  Social History Narrative   Not on file   Social Determinants of Health   Financial Resource Strain: Not on file  Food Insecurity: Not on file  Transportation Needs: Not on file  Physical Activity: Not on file  Stress: Not on file  Social Connections: Not on file  Intimate Partner Violence: Not on file   Family History  Problem Relation Age of Onset   Stroke Mother    Pneumonia Father    Heart disease Father    Healthy Son    Glaucoma Daughter    Emphysema Daughter    Healthy Daughter    Other Daughter        had knee replacement   Healthy Son    Heart disease Maternal Grandmother  VITAL SIGNS BP (!) 113/57   Pulse 78   Temp (!) 97.3 F (36.3 C)   Resp 18   Ht 5\' 4"  (1.626 m)   Wt 133 lb 3.2 oz (60.4 kg)   SpO2 96%   BMI 22.86 kg/m   Outpatient Encounter Medications as of 10/13/2021  Medication Sig   acetaminophen (TYLENOL) 650 MG CR tablet Take 650 mg by mouth every 8 (eight) hours.    albuterol (PROAIR HFA) 108 (90 Base) MCG/ACT inhaler Inhale 2 puffs into the lungs at bedtime. Wait at least 5 minutes between use of multiple inhalers. [DX: Chronic obstructive pulmonary disease, unspecified]   alum & mag hydroxide-simeth (MAALOX PLUS) 400-400-40 MG/5ML suspension Take 30 mLs by mouth every 8 (eight) hours as needed for indigestion.   ANORO ELLIPTA 62.5-25 MCG/INH AEPB Inhale 1 puff into the lungs daily.     apixaban (ELIQUIS) 2.5 MG TABS tablet Take 1 tablet (2.5 mg total) by mouth 2 (two) times daily. Resume 07/20/17   artificial tears (LACRILUBE) OINT ophthalmic ointment Place 1 application into both eyes at bedtime.   Carboxymethylcellulose Sodium (ARTIFICIAL TEARS OP) Apply to eye. Artificial Tears Opthalmic GEL Apply to both eyes at night. Wait 5 minutes between multiple applications in same eye.  Apply to both eyes daily as needed for irritation/dryness. Wait at least 5 minutes between multiple opth administrations in same eye.   cycloSPORINE (RESTASIS) 0.05 % ophthalmic emulsion Place 1 drop into both eyes every 12 (twelve) hours.   diltiazem (CARDIZEM CD) 180 MG 24 hr capsule TAKE ONE CAPSULE BY MOUTH ONCE DAILY.   furosemide (LASIX) 40 MG tablet Take 40 mg by mouth 2 (two) times daily. Chronic diastolic (congestive) heart failure   gabapentin (NEURONTIN) 100 MG capsule Take 200 mg by mouth 2 (two) times daily. Bilateral Lower Extremity Neuropathic Pain   Melatonin 5 MG TABS Take 5 mg by mouth at bedtime as needed.    NON FORMULARY Diet Type:  Regular   Nutritional Supplements (ENSURE ENLIVE PO) Take 237 mLs by mouth daily.   OXYGEN Inhale 2 L into the lungs at bedtime.    pantoprazole (PROTONIX) 40 MG tablet Take 40 mg by mouth 2 (two) times daily.   Polyethyl Glycol-Propyl Glycol (SYSTANE OP) Place 1 drop into both eyes every 4 (four) hours. For dry eyes  Administer 15 minutes after administering Restasis eye drops.   polyethylene glycol (MIRALAX / GLYCOLAX) packet Take 17 g by mouth daily as needed. For constipation   potassium chloride SA (KLOR-CON) 20 MEQ tablet Take 20 mEq by mouth 2 (two) times daily.   traMADol (ULTRAM) 50 MG tablet Take 0.5 tablets (25 mg total) by mouth every 8 (eight) hours.   [DISCONTINUED] Albuterol (VENTOLIN IN) Inhale into the lungs.     No facility-administered encounter medications on file as of 10/13/2021.     SIGNIFICANT DIAGNOSTIC  EXAMS   PREVIOUS  06-15-21: chest x-ray:  Enlargement  is noted possible with some degree of vascular engorgement Oblate appearance could represent a large pericardial effusion.   NO NEW EXAMS.       LABS REVIEWED: PREVIOUS    10-20-20: glucose 68; bun 22; creat 0.90; k+ 3.9; na++ 138; ca 8.7 liver normal albumin 3.6  05-18-21: wbc 4.5; hgb 12.3; hct 39.6; mcv 86.3 plt 169; glucose 83; bun 21; creat 0.88; k+ 3.7; na++ 138; ca 8.5 GFR 59; liver normal albumin 3.3 06-15-21: wbc 3.7; hgb 13.4 hct 43.3; mcv 86.3 plt 164; glucose 98; bun  16; creat 1.02; k+ 3.6; na++ 140; ca 8.4 GFR 49; d-dimer 0.75; CPR 1.0 06-22-21: glucose 72; bun 15; creat 0.88; k+ 3.3; na++ 140; ca 8.1; GFR 59 06-26-21: k+ 3.6     NO NEW LABS.   Review of Systems  Constitutional:  Negative for malaise/fatigue.  Respiratory:  Negative for cough and shortness of breath.   Cardiovascular:  Positive for leg swelling. Negative for chest pain and palpitations.  Gastrointestinal:  Negative for abdominal pain, constipation and heartburn.  Musculoskeletal:  Negative for back pain, joint pain and myalgias.  Skin: Negative.   Neurological:  Negative for dizziness.  Psychiatric/Behavioral:  The patient is not nervous/anxious.    Physical Exam Constitutional:      General: She is not in acute distress.    Appearance: She is well-developed. She is not diaphoretic.  Neck:     Thyroid: No thyromegaly.  Cardiovascular:     Rate and Rhythm: Normal rate and regular rhythm.     Pulses: Normal pulses.     Heart sounds: Normal heart sounds.  Pulmonary:     Effort: Pulmonary effort is normal. No respiratory distress.     Breath sounds: Normal breath sounds.  Abdominal:     General: Bowel sounds are normal. There is no distension.     Palpations: Abdomen is soft.     Tenderness: There is no abdominal tenderness.  Musculoskeletal:        General: Normal range of motion.     Cervical back: Neck supple.     Right lower leg: Edema  present.     Left lower leg: Edema present.     Comments:   3+ bilateral lower extremity edema    Lymphadenopathy:     Cervical: No cervical adenopathy.  Skin:    General: Skin is warm and dry.  Neurological:     Mental Status: She is alert and oriented to person, place, and time.  Psychiatric:        Mood and Affect: Mood normal.     ASSESSMENT/ PLAN:  TODAY;   Long standing persistent atrial fibrillation: is status post pace maker; will continue cardizem cd 180 mg daily for rate control eliquis 2.5 mg twice daily   2. Hypertensive heart and kidney disease with chronic diastolic congestive heart failure and stage 3 chronic kidney disease: is stable 113/57 will continue cardizem cd 180 mg daily is off norvasc  3. Aortic atherosclerosis  4. Chronic obstructive pulmonary disease is stable 02 dependent will continue symbicort 62.5/25 mcg 1 puff daily; albuterol 2 puffs nightly    PREVIOUS   5. Chronic kidney disease stage 3 (moderate) is stable bun 16 creat 1.02 GFR 49  6. Peripheral neuropathy: is stable will continue gabapentin 200 mg twice daily   7. GERD without esophagitis: is stable will continue protonix 40 mg twice daily   8. Chronic generalized pain: is worse will continue tylenol cr 650 mg every 6 hours; ultram 25 mg three times daily will begin voltaren gel 2 gm to both shoulders twice daily   9. CVA (cerebrovascular accident): is stable will continue eliquis 2.5 mg twice daily   10. Complete heart block is status post [employer] PTV/DP pacemaker will monitor   11. GERD without esophagitis is stable will continue protonix 40 mg twice daily   12. Dyslipidemia is stable is off lipitor  13. Chronic constipation: is stable will continue miralax daily as needed  14. Chronic diastolic heart failure EF 60-65% (06-21-17) is stable  will continue lasix 20 mg twice daily     Ok Edwards NP [facility]  Contact [phone] Monday through Friday 8am- 5pm   After hours call [phone]

## 2021-10-31 ENCOUNTER — Encounter: Payer: Self-pay | Admitting: Adult Health

## 2021-10-31 ENCOUNTER — Non-Acute Institutional Stay (SKILLED_NURSING_FACILITY): Payer: Medicare Other | Admitting: Adult Health

## 2021-10-31 DIAGNOSIS — M10372 Gout due to renal impairment, left ankle and foot: Secondary | ICD-10-CM | POA: Diagnosis not present

## 2021-10-31 DIAGNOSIS — L03116 Cellulitis of left lower limb: Secondary | ICD-10-CM

## 2021-10-31 NOTE — Progress Notes (Signed)
Location:  [place] Room Number: 108-W Place of Service:  SNF (31)   CODE STATUS: DNR  Allergies  Allergen Reactions   Elemental Sulfur Swelling and Rash    Chief Complaint  Patient presents with   Acute Visit    Left foot pain     HPI:  She is complaining of left foot pain. Started several days ago. Her current regimen is not effective for her pain. Her left great toe is red and painful. The top of her foot has a rash as well. There are no reports of fevers present.   Past Medical History:  Diagnosis Date   Atrial fibrillation (HCC)    Bradycardia    Breast nodule 11/15/2014   Breast pain, left 11/15/2014   Carotid artery disease (HCC)    Dizziness    Dyspnea    Previous CPX suggesting possible restrictive physiology, respiratory muscle fatigue, diastolic dysfunction   Essential hypertension, benign    Hyperlipidemia    Oxygen dependent    2 liter   Pacemaker [employer]    Rib pain on left side 11/15/2014   Seasonal allergies    Shingles 05/04/2015   Stroke ([place])    Toe fracture, left    left big toe    Past Surgical History:  Procedure Laterality Date   COLONOSCOPY  02/22/2012   Procedure: COLONOSCOPY;  Surgeon: Rogene Houston, MD;  [facility];  Service: Endoscopy;  Laterality: N/A;  100   KNEE SURGERY     bilateral   PACEMAKER IMPLANT N/A 07/17/2017   Procedure: Pacemaker Implant;  Surgeon: Deboraha Sprang, MD;  Location: [place] [facility] LAB;  Service: Cardiovascular;  Laterality: N/A;    Social History   Socioeconomic History   Marital status: Widowed    Spouse name: Not on file   Number of children: Not on file   Years of education: Not on file   Highest education level: Not on file  Occupational History   Not on file  Tobacco Use   Smoking status: Never   Smokeless tobacco: Never  Vaping Use   Vaping Use: Never used  Substance and Sexual Activity   Alcohol use: No   Drug use: No   Sexual activity: Never     Birth control/protection: Post-menopausal  Other Topics Concern   Not on file  Social History Narrative   Not on file   Social Determinants of Health   Financial Resource Strain: Not on file  Food Insecurity: Not on file  Transportation Needs: Not on file  Physical Activity: Not on file  Stress: Not on file  Social Connections: Not on file  Intimate Partner Violence: Not on file   Family History  Problem Relation Age of Onset   Stroke Mother    Pneumonia Father    Heart disease Father    Healthy Son    Glaucoma Daughter    Emphysema Daughter    Healthy Daughter    Other Daughter        had knee replacement   Healthy Son    Heart disease Maternal Grandmother       VITAL SIGNS BP 114/72   Pulse 62   Temp (!) 97.3 F (36.3 C)   Resp 16   Ht 5\' 4"  (1.626 m)   Wt 133 lb 3.2 oz (60.4 kg)   BMI 22.86 kg/m   Outpatient Encounter Medications as of 10/31/2021  Medication Sig   acetaminophen (TYLENOL) 650 MG  CR tablet Take 650 mg by mouth every 12 (twelve) hours. 9 am and 9 pm   albuterol (VENTOLIN HFA) 108 (90 Base) MCG/ACT inhaler Inhale 2 puffs into the lungs at bedtime. Wait at least 5 minutes between use of multiple inhalers. [DX: Chronic obstructive pulmonary disease, unspecified]   alum & mag hydroxide-simeth (MAALOX PLUS) 400-400-40 MG/5ML suspension Take 30 mLs by mouth every 8 (eight) hours as needed for indigestion.   ANORO ELLIPTA 62.5-25 MCG/INH AEPB Inhale 1 puff into the lungs daily.    apixaban (ELIQUIS) 2.5 MG TABS tablet Take 1 tablet (2.5 mg total) by mouth 2 (two) times daily. Resume 07/20/17   artificial tears (LACRILUBE) OINT ophthalmic ointment Place 1 application into both eyes at bedtime.   carbamide peroxide (DEBROX) 6.5 % OTIC solution Place 3 drops into both ears at bedtime. X 3 days   Carboxymethylcellulose Sodium (ARTIFICIAL TEARS OP) Apply to eye. Artificial Tears Opthalmic GEL Apply to both eyes at night. Wait 5 minutes between multiple  applications in same eye.  Apply to both eyes daily as needed for irritation/dryness. Wait at least 5 minutes between multiple opth administrations in same eye.   colchicine 0.6 MG tablet Take 0.3 mg by mouth daily. 12 pm   cycloSPORINE (RESTASIS) 0.05 % ophthalmic emulsion Place 1 drop into both eyes every 12 (twelve) hours.   diltiazem (CARDIZEM CD) 180 MG 24 hr capsule TAKE ONE CAPSULE BY MOUTH ONCE DAILY.   doxycycline (MONODOX) 50 MG capsule Take 50 mg by mouth 2 (two) times daily. For left foot cellulitis   furosemide (LASIX) 40 MG tablet Take 40 mg by mouth 2 (two) times daily. Chronic diastolic (congestive) heart failure   gabapentin (NEURONTIN) 100 MG capsule Take 200 mg by mouth 2 (two) times daily. Bilateral Lower Extremity Neuropathic Pain   Melatonin 5 MG TABS Take 5 mg by mouth at bedtime as needed.    Menthol, Topical Analgesic, (BIOFREEZE) 4 % GEL Apply 1 application topically in the morning and at bedtime. To shoulders and knees   NON FORMULARY Diet Type:  Regular   Nutritional Supplements (ENSURE ENLIVE PO) Take 237 mLs by mouth daily.   OXYGEN Inhale 2 L into the lungs at bedtime.    pantoprazole (PROTONIX) 40 MG tablet Take 40 mg by mouth 2 (two) times daily.   Polyethyl Glycol-Propyl Glycol (SYSTANE OP) Place 1 drop into both eyes every 4 (four) hours. For dry eyes  Administer 15 minutes after administering Restasis eye drops.   polyethylene glycol (MIRALAX / GLYCOLAX) packet Take 17 g by mouth daily as needed. For constipation   potassium chloride SA (KLOR-CON) 20 MEQ tablet Take 20 mEq by mouth 2 (two) times daily.   traMADol (ULTRAM) 50 MG tablet Take 0.5 tablets (25 mg total) by mouth every 8 (eight) hours.   [DISCONTINUED] Albuterol (VENTOLIN IN) Inhale into the lungs.     No facility-administered encounter medications on file as of 10/31/2021.     SIGNIFICANT DIAGNOSTIC EXAMS  PREVIOUS  06-15-21: chest x-ray:  Enlargement  is noted possible with some degree of  vascular engorgement Oblate appearance could represent a large pericardial effusion.   NO NEW EXAMS.       LABS REVIEWED: PREVIOUS    10-20-20: glucose 68; bun 22; creat 0.90; k+ 3.9; na++ 138; ca 8.7 liver normal albumin 3.6  05-18-21: wbc 4.5; hgb 12.3; hct 39.6; mcv 86.3 plt 169; glucose 83; bun 21; creat 0.88; k+ 3.7; na++ 138; ca 8.5 GFR 59; liver  normal albumin 3.3 06-15-21: wbc 3.7; hgb 13.4 hct 43.3; mcv 86.3 plt 164; glucose 98; bun 16; creat 1.02; k+ 3.6; na++ 140; ca 8.4 GFR 49; d-dimer 0.75; CPR 1.0 06-22-21: glucose 72; bun 15; creat 0.88; k+ 3.3; na++ 140; ca 8.1; GFR 59 06-26-21: k+ 3.6     NO NEW LABS.   Review of Systems  Constitutional:  Negative for malaise/fatigue.  Respiratory:  Negative for cough and shortness of breath.   Cardiovascular:  Positive for leg swelling. Negative for chest pain and palpitations.  Gastrointestinal:  Negative for abdominal pain, constipation and heartburn.  Musculoskeletal:  Positive for joint pain. Negative for back pain and myalgias.       Left great toe pain   Skin:        Rash top of left foot   Neurological:  Negative for dizziness.  Psychiatric/Behavioral:  The patient is not nervous/anxious.    Physical Exam Constitutional:      General: She is not in acute distress.    Appearance: She is well-developed. She is not diaphoretic.  Neck:     Thyroid: No thyromegaly.  Cardiovascular:     Rate and Rhythm: Normal rate and regular rhythm.     Heart sounds: Normal heart sounds.  Pulmonary:     Effort: Pulmonary effort is normal. No respiratory distress.     Breath sounds: Normal breath sounds.  Abdominal:     General: Bowel sounds are normal. There is no distension.     Palpations: Abdomen is soft.     Tenderness: There is no abdominal tenderness.  Musculoskeletal:        General: Normal range of motion.     Cervical back: Neck supple.     Right lower leg: Edema present.     Left lower leg: Edema present.     Comments: 2-3+  bilateral lower extremity edema Left great toe is red warm and swollen  Lymphadenopathy:     Cervical: No cervical adenopathy.  Skin:    General: Skin is warm and dry.     Comments: Blotchy rash on top of left foot.   Neurological:     Mental Status: She is alert. Mental status is at baseline.  Psychiatric:        Mood and Affect: Mood normal.      ASSESSMENT/ PLAN:  TODAY   Acute gout left great toe Left foot cellulitis  Will begin colchicine 0.3 mg daily through 11-07-21 Will begin doxycyline 50 mg twice daily through 11-07-21    Ok Edwards NP [facility]  Contact [phone] Monday through Friday 8am- 5pm  After hours call [phone]

## 2021-11-09 ENCOUNTER — Non-Acute Institutional Stay (SKILLED_NURSING_FACILITY): Payer: Medicare Other | Admitting: Adult Health

## 2021-11-09 ENCOUNTER — Other Ambulatory Visit: Payer: Self-pay | Admitting: Adult Health

## 2021-11-09 ENCOUNTER — Encounter: Payer: Self-pay | Admitting: Adult Health

## 2021-11-09 DIAGNOSIS — M159 Polyosteoarthritis, unspecified: Secondary | ICD-10-CM

## 2021-11-09 MED ORDER — TRAMADOL HCL 50 MG PO TABS: 25.0000 mg | ORAL_TABLET | Freq: Three times a day (TID) | ORAL | 0 refills | Status: DC

## 2021-11-09 NOTE — Progress Notes (Signed)
Location:  [place] Room Number: 108-W Place of Service:  SNF (31)   CODE STATUS: DNR  Allergies  Allergen Reactions   Elemental Sulfur Swelling and Rash    Chief Complaint  Patient presents with   Acute Visit    Pain management     HPI:  Her family is concerned about her level of pain. She has bilateral knee pain; left ankle and left shoulder pain. She is presently taking tylenol; low dose ultram and topical treatments. Her family is wanting to have her shoulder injected. I did explain to her family that I do not do joint injections. They would like an ortho consult to get her joints injected.   Past Medical History:  Diagnosis Date   Atrial fibrillation (HCC)    Bradycardia    Breast nodule 11/15/2014   Breast pain, left 11/15/2014   Carotid artery disease (HCC)    Dizziness    Dyspnea    Previous CPX suggesting possible restrictive physiology, respiratory muscle fatigue, diastolic dysfunction   Essential hypertension, benign    Hyperlipidemia    Oxygen dependent    2 liter   Pacemaker [employer]    Rib pain on left side 11/15/2014   Seasonal allergies    Shingles 05/04/2015   Stroke ([place])    Toe fracture, left    left big toe    Past Surgical History:  Procedure Laterality Date   COLONOSCOPY  02/22/2012   Procedure: COLONOSCOPY;  Surgeon: Rogene Houston, MD;  [facility];  Service: Endoscopy;  Laterality: N/A;  100   KNEE SURGERY     bilateral   PACEMAKER IMPLANT N/A 07/17/2017   Procedure: Pacemaker Implant;  Surgeon: Deboraha Sprang, MD;  Location: [place] CV LAB;  Service: Cardiovascular;  Laterality: N/A;    Social History   Socioeconomic History   Marital status: Widowed    Spouse name: Not on file   Number of children: Not on file   Years of education: Not on file   Highest education level: Not on file  Occupational History   Not on file  Tobacco Use   Smoking status: Never   Smokeless tobacco: Never   Vaping Use   Vaping Use: Never used  Substance and Sexual Activity   Alcohol use: No   Drug use: No   Sexual activity: Never    Birth control/protection: Post-menopausal  Other Topics Concern   Not on file  Social History Narrative   Not on file   Social Determinants of Health   Financial Resource Strain: Not on file  Food Insecurity: Not on file  Transportation Needs: Not on file  Physical Activity: Not on file  Stress: Not on file  Social Connections: Not on file  Intimate Partner Violence: Not on file   Family History  Problem Relation Age of Onset   Stroke Mother    Pneumonia Father    Heart disease Father    Healthy Son    Glaucoma Daughter    Emphysema Daughter    Healthy Daughter    Other Daughter        had knee replacement   Healthy Son    Heart disease Maternal Grandmother       VITAL SIGNS BP (!) 145/73   Pulse 63   Temp 98 F (36.7 C)   Resp 18   Ht 5\' 4"  (1.626 m)   Wt 133 lb 3.2 oz (60.4 kg)   SpO2 96%  BMI 22.86 kg/m   Outpatient Encounter Medications as of 11/09/2021  Medication Sig   acetaminophen (TYLENOL) 650 MG CR tablet Take 650 mg by mouth every 12 (twelve) hours. 9 am and 9 pm   albuterol (VENTOLIN HFA) 108 (90 Base) MCG/ACT inhaler Inhale 2 puffs into the lungs at bedtime. Wait at least 5 minutes between use of multiple inhalers. [DX: Chronic obstructive pulmonary disease, unspecified]   alum & mag hydroxide-simeth (MAALOX PLUS) 400-400-40 MG/5ML suspension Take 30 mLs by mouth every 8 (eight) hours as needed for indigestion.   ANORO ELLIPTA 62.5-25 MCG/INH AEPB Inhale 1 puff into the lungs daily.    apixaban (ELIQUIS) 2.5 MG TABS tablet Take 1 tablet (2.5 mg total) by mouth 2 (two) times daily. Resume 07/20/17   artificial tears (LACRILUBE) OINT ophthalmic ointment Place 1 application into both eyes at bedtime.   Carboxymethylcellulose Sodium (ARTIFICIAL TEARS OP) Apply to eye. Artificial Tears Opthalmic GEL Apply to both eyes at  night. Wait 5 minutes between multiple applications in same eye.  Apply to both eyes daily as needed for irritation/dryness. Wait at least 5 minutes between multiple opth administrations in same eye.   cycloSPORINE (RESTASIS) 0.05 % ophthalmic emulsion Place 1 drop into both eyes 2 (two) times daily.   diclofenac Sodium (VOLTAREN) 1 % GEL Apply 2 g topically 3 (three) times daily. Special Instructions: apply to left shoulder bilateral knees left ankle for pain management   diltiazem (CARDIZEM CD) 180 MG 24 hr capsule TAKE ONE CAPSULE BY MOUTH ONCE DAILY.   furosemide (LASIX) 40 MG tablet Take 40 mg by mouth 2 (two) times daily. Chronic diastolic (congestive) heart failure   gabapentin (NEURONTIN) 100 MG capsule Take 200 mg by mouth 2 (two) times daily. Bilateral Lower Extremity Neuropathic Pain   Melatonin 5 MG TABS Take 5 mg by mouth at bedtime as needed.    NON FORMULARY Diet Type:  Regular   Nutritional Supplements (ENSURE ENLIVE PO) Take 237 mLs by mouth daily.   OXYGEN Inhale 2 L into the lungs at bedtime.    pantoprazole (PROTONIX) 40 MG tablet Take 40 mg by mouth 2 (two) times daily.   Polyethyl Glycol-Propyl Glycol (SYSTANE OP) Place 1 drop into both eyes every 4 (four) hours. For dry eyes  Administer 15 minutes after administering Restasis eye drops.   polyethylene glycol (MIRALAX / GLYCOLAX) packet Take 17 g by mouth daily as needed. For constipation   potassium chloride SA (KLOR-CON) 20 MEQ tablet Take 20 mEq by mouth 2 (two) times daily.   traMADol (ULTRAM) 50 MG tablet Take 0.5 tablets (25 mg total) by mouth every 8 (eight) hours.   Menthol, Topical Analgesic, (BIOFREEZE) 4 % GEL Apply 1 application topically in the morning and at bedtime. To shoulders and knees   [DISCONTINUED] colchicine 0.6 MG tablet Take 0.3 mg by mouth daily. 12 pm   [DISCONTINUED] doxycycline (MONODOX) 50 MG capsule Take 50 mg by mouth 2 (two) times daily. For left foot cellulitis   No facility-administered  encounter medications on file as of 11/09/2021.     SIGNIFICANT DIAGNOSTIC EXAMS   PREVIOUS  06-15-21: chest x-ray:  Enlargement  is noted possible with some degree of vascular engorgement Oblate appearance could represent a large pericardial effusion.   NO NEW EXAMS.       LABS REVIEWED: PREVIOUS    10-20-20: glucose 68; bun 22; creat 0.90; k+ 3.9; na++ 138; ca 8.7 liver normal albumin 3.6  05-18-21: wbc 4.5; hgb 12.3; hct  39.6; mcv 86.3 plt 169; glucose 83; bun 21; creat 0.88; k+ 3.7; na++ 138; ca 8.5 GFR 59; liver normal albumin 3.3 06-15-21: wbc 3.7; hgb 13.4 hct 43.3; mcv 86.3 plt 164; glucose 98; bun 16; creat 1.02; k+ 3.6; na++ 140; ca 8.4 GFR 49; d-dimer 0.75; CPR 1.0 06-22-21: glucose 72; bun 15; creat 0.88; k+ 3.3; na++ 140; ca 8.1; GFR 59 06-26-21: k+ 3.6     NO NEW LABS.   Review of Systems  Constitutional:  Negative for malaise/fatigue.  Respiratory:  Negative for cough and shortness of breath.   Cardiovascular:  Negative for chest pain, palpitations and leg swelling.  Gastrointestinal:  Negative for abdominal pain, constipation and heartburn.  Musculoskeletal:  Positive for joint pain. Negative for back pain and myalgias.       Left shoulder bilateral knees and ankle pain   Skin: Negative.   Neurological:  Negative for dizziness.  Psychiatric/Behavioral:  The patient is not nervous/anxious.      Physical Exam Constitutional:      General: She is not in acute distress.    Appearance: She is well-developed. She is not diaphoretic.  Neck:     Thyroid: No thyromegaly.  Cardiovascular:     Rate and Rhythm: Normal rate and regular rhythm.     Heart sounds: Normal heart sounds.  Pulmonary:     Effort: Pulmonary effort is normal. No respiratory distress.     Breath sounds: Normal breath sounds.  Abdominal:     General: Bowel sounds are normal. There is no distension.     Palpations: Abdomen is soft.     Tenderness: There is no abdominal tenderness.   Musculoskeletal:        General: Normal range of motion.     Cervical back: Neck supple.     Right lower leg: Edema present.     Left lower leg: Edema present.     Comments:  2-3+ bilateral lower extremity edema  Lymphadenopathy:     Cervical: No cervical adenopathy.  Skin:    General: Skin is warm and dry.  Neurological:     Mental Status: She is alert. Mental status is at baseline.  Psychiatric:        Mood and Affect: Mood normal.      ASSESSMENT/ PLAN:  TODAY  Primary osteoarthritis multiple sites: is worse will stop biofreeze. Will continue tylenol and ultram will begin voltaren gel 1% 2 gm three times daily to left shoulder; left ankle and bilateral knees. Will setup ortho consult for joint injection and will monitor her status.     Ok Edwards NP [facility]  Contact [phone] Monday through Friday 8am- 5pm  After hours call [phone]

## 2021-11-13 DIAGNOSIS — I739 Peripheral vascular disease, unspecified: Secondary | ICD-10-CM | POA: Diagnosis not present

## 2021-11-13 DIAGNOSIS — L603 Nail dystrophy: Secondary | ICD-10-CM | POA: Diagnosis not present

## 2021-11-13 DIAGNOSIS — L602 Onychogryphosis: Secondary | ICD-10-CM | POA: Diagnosis not present

## 2021-11-16 ENCOUNTER — Ambulatory Visit: Payer: Medicare Other | Admitting: Orthopedic Surgery

## 2021-11-17 ENCOUNTER — Encounter: Payer: Self-pay | Admitting: Internal Medicine

## 2021-11-17 ENCOUNTER — Non-Acute Institutional Stay (SKILLED_NURSING_FACILITY): Payer: Medicare Other | Admitting: Internal Medicine

## 2021-11-17 DIAGNOSIS — M25512 Pain in left shoulder: Secondary | ICD-10-CM

## 2021-11-17 DIAGNOSIS — I10 Essential (primary) hypertension: Secondary | ICD-10-CM

## 2021-11-17 DIAGNOSIS — G8929 Other chronic pain: Secondary | ICD-10-CM | POA: Diagnosis not present

## 2021-11-17 DIAGNOSIS — M25562 Pain in left knee: Secondary | ICD-10-CM | POA: Diagnosis not present

## 2021-11-17 DIAGNOSIS — N1831 Chronic kidney disease, stage 3a: Secondary | ICD-10-CM | POA: Diagnosis not present

## 2021-11-17 NOTE — Assessment & Plan Note (Signed)
Current creatinine is 0.88 with a GFR of 59 indicating high CKD stage II.  This represents an improvement from the prior GFR 49.  There is no indication for med change at this time.

## 2021-11-17 NOTE — Assessment & Plan Note (Addendum)
She actually describes pain in both shoulders.  She describes pain when she elevates her arm to the shoulder level.  Shoulder impingement syndrome related to ambulating in the wheelchair is suggested clinically rather than PMR.  Orthopedic follow-up is scheduled 11/21/2021. Discuss repeat sed rate with Bard Herbert NP.

## 2021-11-17 NOTE — Patient Instructions (Signed)
See assessment and plan under each diagnosis in the problem list and acutely for this visit 

## 2021-11-17 NOTE — Assessment & Plan Note (Addendum)
She actually describes pain from the knee to the foot.  Biofreeze topically does help. Clinically the DIP and PIP changes of the hands with lateral deviation and partial contractions on the right suggest almost a rheumatoid arthritis picture. There is no RA factor found on labs.  The only sed rate on record was normal at 18.  It is also reassuring that the C-reactive protein in July was not significantly elevated.

## 2021-11-17 NOTE — Progress Notes (Signed)
NURSING HOME LOCATION:  [facility] ROOM NUMBER: 29 W    CODE STATUS:  DNR  PCP:  Ok Edwards NP  This a nursing facility follow up visit of chronic medical diagnoses & to document compliance with Regulation 483.30 (c) in [employer] Phase 2 which mandates caregiver visit ( visits can alternate among physician, PA or NP as per statutes) within 10 days of 30 days / 60 days/ 90 days post admission to SNF date    Interim medical record and care since last SNF visit was updated with review of diagnostic studies and change in clinical status since last visit were documented.  HPI: She is a permanent resident of this facility with medical diagnoses of atrial fibrillation, carotid artery disease, essential hypertension, dyslipidemia, history of stroke, and status post [employer] pacemaker placement.  Most recent labs were in July; when she tested positive for COVID.  D-dimer at that time was 0.75 and CRP 1.0, both without significant elevation.  She had mild hypokalemia of 3.3 which was corrected.  Creatinine was 0.88 with a GFR of 59 indicating high CKD stage II.  This represents an improvement from her prior GFR of 49.  White count was low at 3700 but CBC and platelet count were normal.  Review of systems: She continues to complain of constant pain in her shoulders and left lower extremity from the knee to the foot.  Occasionally she has had pain in the right lower extremity as well.  Apparently she gets benefit from Biofreeze topically.  She has intermittent numbness of the legs.  She describes exertional dyspnea when she mobilizes in the wheelchair.  Orthopedic follow-up with Dr. Amedeo Kinsman is scheduled for 12/13. She describes exertional dyspnea using the wheelchair.  Constitutional: No fever, significant weight change  Eyes: No redness, discharge, pain, vision change ENT/mouth: No nasal congestion,  purulent discharge, earache, change in hearing, sore  throat  Cardiovascular: No chest pain, palpitations, paroxysmal nocturnal dyspnea, claudication, edema  Respiratory: No cough, sputum production, hemoptysis, significant snoring, apnea   Gastrointestinal: No heartburn, dysphagia, abdominal pain, nausea /vomiting, rectal bleeding, melena, change in bowels Genitourinary: No dysuria, hematuria, pyuria, incontinence, nocturia Dermatologic: No rash, pruritus, change in appearance of skin Neurologic: No dizziness, headache, syncope, seizures Psychiatric: No significant anxiety, depression, insomnia, anorexia Endocrine: No change in hair/skin/nails, excessive thirst, excessive hunger, excessive urination  Hematologic/lymphatic: No significant bruising, lymphadenopathy, abnormal bleeding  Physical exam:  Pertinent or positive findings: She appears her stated age.  She is profoundly deaf even though she wears bilateral hearing aids.  She has bilateral ptosis and prominent lacrimal glands.  The lower lids are very puffy.  Dentition is intact and quite good for age.  Heart sounds are slow and distant.  She has rales localized to the left anterior chest in the parasternal area.  Bronchovesicular breath sounds are suggested in the posterior thorax.  Pedal pulses were not palpable.  She is wearing elastic stockings.  There is fusiform enlargement of the right knee more than the left with a suggestion of possible effusion.  She has severe DIP and PIP joint changes with lateral deviation of the hands.  She has partial flexion contractures of the right hand.  She describes pain with elevation of the upper extremities to the shoulder level.  General appearance: Adequately nourished; no acute distress, increased work of breathing is present.   Lymphatic: No lymphadenopathy about the head, neck, axilla. Eyes: No conjunctival inflammation or lid  edema is present. There is no scleral icterus. Ears:  External ear exam shows no significant lesions or deformities.   Nose:   External nasal examination shows no deformity or inflammation. Nasal mucosa are pink and moist without lesions, exudates Oral exam:  Lips and gums are healthy appearing. There is no oropharyngeal erythema or exudate. Neck:  No thyromegaly, masses, tenderness noted.    Heart:  No gallop, murmur, click, rub .  Lungs:  without wheezes, rhonchi, rubs. Abdomen: Bowel sounds are normal. Abdomen is soft and nontender with no organomegaly, hernias, masses. GU: Deferred  Extremities:  No cyanosis, clubbing  Neurologic exam :Balance, Rhomberg, finger to nose testing could not be completed due to clinical state Skin: Warm & dry w/o tenting. No significant lesions or rash.  See summary under each active problem in the Problem List with associated updated therapeutic plan

## 2021-11-17 NOTE — Assessment & Plan Note (Signed)
BP controlled; no change in antihypertensive medications  

## 2021-11-21 ENCOUNTER — Ambulatory Visit (INDEPENDENT_AMBULATORY_CARE_PROVIDER_SITE_OTHER): Payer: Medicare Other | Admitting: Orthopedic Surgery

## 2021-11-21 ENCOUNTER — Encounter: Payer: Self-pay | Admitting: Orthopedic Surgery

## 2021-11-21 ENCOUNTER — Inpatient Hospital Stay: Payer: Medicare Other

## 2021-11-21 VITALS — Ht 64.0 in | Wt 133.0 lb

## 2021-11-21 DIAGNOSIS — M12812 Other specific arthropathies, not elsewhere classified, left shoulder: Secondary | ICD-10-CM

## 2021-11-21 DIAGNOSIS — G8929 Other chronic pain: Secondary | ICD-10-CM

## 2021-11-21 DIAGNOSIS — M25512 Pain in left shoulder: Secondary | ICD-10-CM

## 2021-11-21 DIAGNOSIS — M25511 Pain in right shoulder: Secondary | ICD-10-CM

## 2021-11-21 DIAGNOSIS — M12811 Other specific arthropathies, not elsewhere classified, right shoulder: Secondary | ICD-10-CM | POA: Diagnosis not present

## 2021-11-21 NOTE — Patient Instructions (Signed)

## 2021-11-21 NOTE — Progress Notes (Signed)
New Patient Visit  Assessment: Wendy Perkins is a 87 y.o. female with the following: 1. Rotator cuff arthropathy of both shoulders  Plan: Radiographs of both shoulders demonstrates severe glenohumeral arthritis, as well as rotator cuff arthropathy.  Given her age, she is not a surgical candidate.  She is interested in steroid injections.  I think this is reasonable.  I stressed the point that because of the severity, there are no guarantees as it relates to relief following these injections.  The patient and her son both stated their understanding.  Follow-up as needed.  Procedure note injection Left shoulder    Verbal consent was obtained to inject the left shoulder, subacromial space Timeout was completed to confirm the site of injection.  The skin was prepped with alcohol and ethyl chloride was sprayed at the injection site.  A 21-gauge needle was used to inject 40 mg of Depo-Medrol and 1% lidocaine (3 cc) into the subacromial space of the left shoulder using a posterolateral approach.  There were no complications. A sterile bandage was applied.   Procedure note injection - Right shoulder    Verbal consent was obtained to inject the right shoulder, subacromial space Timeout was completed to confirm the site of injection.   The skin was prepped with alcohol and ethyl chloride was sprayed at the injection site.  A 21-gauge needle was used to inject 40 mg of Depo-Medrol and 1% lidocaine (3 cc) into the subacromial space of the right shoulder using a posterolateral approach.  There were no complications.  A sterile bandage was applied.     Follow-up: Return if symptoms worsen or fail to improve.  Subjective:  Chief Complaint  Patient presents with   Shoulder Pain    Bilat shoulder pain for years would like injections.     History of Present Illness: Wendy Perkins is a 87 y.o. female who has been referred to clinic today by Ok Edwards, NP for evaluation of bilateral  shoulder pain.  Right shoulder is currently worse than her left.  She has had pain in both shoulders for several years.  She takes Tylenol as needed for pain.  She has limited range of motion, and notes severe pain with motion.  She has had injections in the past, is interested in repeat injections today.   Review of Systems: No fevers or chills No numbness or tingling No chest pain No shortness of breath No bowel or bladder dysfunction No GI distress No headaches   Medical History:  Past Medical History:  Diagnosis Date   Atrial fibrillation (HCC)    Bradycardia    Breast nodule 11/15/2014   Breast pain, left 11/15/2014   Carotid artery disease (HCC)    Dizziness    Dyspnea    Previous CPX suggesting possible restrictive physiology, respiratory muscle fatigue, diastolic dysfunction   Essential hypertension, benign    Hyperlipidemia    Oxygen dependent    2 liter   Pacemaker [employer]    Rib pain on left side 11/15/2014   Seasonal allergies    Shingles 05/04/2015   Stroke ([place])    Toe fracture, left    left big toe    Past Surgical History:  Procedure Laterality Date   COLONOSCOPY  02/22/2012   Procedure: COLONOSCOPY;  Surgeon: Rogene Houston, MD;  [facility];  Service: Endoscopy;  Laterality: N/A;  100   KNEE SURGERY     bilateral   PACEMAKER IMPLANT N/A 07/17/2017   Procedure:  Pacemaker Implant;  Surgeon: Deboraha Sprang, MD;  Location: [facility];  Service: Cardiovascular;  Laterality: N/A;    Family History  Problem Relation Age of Onset   Stroke Mother    Pneumonia Father    Heart disease Father    Healthy Son    Glaucoma Daughter    Emphysema Daughter    Healthy Daughter    Other Daughter        had knee replacement   Healthy Son    Heart disease Maternal Grandmother    Social History   Tobacco Use   Smoking status: Never   Smokeless tobacco: Never  Vaping Use   Vaping Use: Never used  Substance Use Topics   Alcohol use: No    Drug use: Yes    Types: Amyl nitrate    Allergies  Allergen Reactions   Elemental Sulfur Swelling and Rash    No outpatient medications have been marked as taking for the 11/21/21 encounter (Office Visit) with Mordecai Rasmussen, MD.    Objective: Ht 5\' 4"  (1.626 m)    Wt 133 lb (60.3 kg)    BMI 22.83 kg/m   Physical Exam:  General: Elderly female., Alert and oriented., No acute distress., and Seated in a wheelchair.  Evaluation of bilateral shoulders demonstrates diffuse atrophy.  Forward flexion limited to 100 degrees.  Crepitus is appreciated with gentle range of motion.  Fingers warm and well-perfused.  2+ radial pulse bilaterally.    IMAGING: I personally ordered and reviewed the following images  X-rays of bilateral shoulders were obtained in clinic today.  There is evidence of severe glenohumeral arthritis, as well as rotator cuff arthropathy.  There is acetabular rotation of the acromion and bilateral shoulders.  There is flattening of the humeral head in the right shoulder.  Impression: Severe bilateral glenohumeral arthritis, with evidence of chronic rotator cuff tear.  New Medications:  No orders of the defined types were placed in this encounter.     Mordecai Rasmussen, MD  11/21/2021 12:02 PM

## 2021-11-24 ENCOUNTER — Encounter: Payer: Self-pay | Admitting: Adult Health

## 2021-11-24 ENCOUNTER — Non-Acute Institutional Stay (SKILLED_NURSING_FACILITY): Payer: Medicare Other | Admitting: Adult Health

## 2021-11-24 DIAGNOSIS — I5032 Chronic diastolic (congestive) heart failure: Secondary | ICD-10-CM

## 2021-11-24 DIAGNOSIS — I700 Atherosclerosis of aorta: Secondary | ICD-10-CM

## 2021-11-24 DIAGNOSIS — I639 Cerebral infarction, unspecified: Secondary | ICD-10-CM

## 2021-11-24 NOTE — Progress Notes (Signed)
Location:  [facility] Room Number: 108-W Place of Service:  SNF (31)   CODE STATUS: DNR  Allergies  Allergen Reactions   Elemental Sulfur Swelling and Rash    Chief Complaint  Patient presents with   Acute Visit    Care plan meeting    HPI:  We have come together for her care plan meeting. BIMS 12/15 mood 2/30: frequently tired. She requires limited to extensive assist with her adls. She is occassionally incontinent of bladder frequently incontinent of bowel.  There have been no falls; she is nonambulatory. Dietary: regular diet: fair appetite; feeds self. Weight is 133.6 pounds up 3.6 pounds in 3 months. No therapy as this time. She continues to be followed for her chronic illnesses including:   Aortic atherosclerosis  Chronic diastolic heart failure Cerebrovascular accident (CVA) unspecified mechanism  Past Medical History:  Diagnosis Date   Atrial fibrillation (HCC)    Bradycardia    Breast nodule 11/15/2014   Breast pain, left 11/15/2014   Carotid artery disease (HCC)    Dizziness    Dyspnea    Previous CPX suggesting possible restrictive physiology, respiratory muscle fatigue, diastolic dysfunction   Essential hypertension, benign    Hyperlipidemia    Oxygen dependent    2 liter   Pacemaker [employer]    Rib pain on left side 11/15/2014   Seasonal allergies    Shingles 05/04/2015   Stroke ([place])    Toe fracture, left    left big toe    Past Surgical History:  Procedure Laterality Date   COLONOSCOPY  02/22/2012   Procedure: COLONOSCOPY;  Surgeon: Rogene Houston, MD;  [facility];  Service: Endoscopy;  Laterality: N/A;  100   KNEE SURGERY     bilateral   PACEMAKER IMPLANT N/A 07/17/2017   Procedure: Pacemaker Implant;  Surgeon: Deboraha Sprang, MD;  Location: [facility];  Service: Cardiovascular;  Laterality: N/A;    Social History   Socioeconomic History   Marital status: Widowed    Spouse name: Not on file   Number of  children: Not on file   Years of education: Not on file   Highest education level: Not on file  Occupational History   Not on file  Tobacco Use   Smoking status: Never   Smokeless tobacco: Never  Vaping Use   Vaping Use: Never used  Substance and Sexual Activity   Alcohol use: No   Drug use: Yes    Types: Amyl nitrate   Sexual activity: Never    Birth control/protection: Post-menopausal  Other Topics Concern   Not on file  Social History Narrative   Not on file   Social Determinants of Health   Financial Resource Strain: Not on file  Food Insecurity: Not on file  Transportation Needs: Not on file  Physical Activity: Not on file  Stress: Not on file  Social Connections: Not on file  Intimate Partner Violence: Not on file   Family History  Problem Relation Age of Onset   Stroke Mother    Pneumonia Father    Heart disease Father    Healthy Son    Glaucoma Daughter    Emphysema Daughter    Healthy Daughter    Other Daughter        had knee replacement   Healthy Son    Heart disease Maternal Grandmother       VITAL SIGNS BP (!) 111/54    Pulse 75  Temp 97.6 F (36.4 C)    Resp 18    Ht 5\' 4"  (1.626 m)    Wt 133 lb 9.6 oz (60.6 kg)    SpO2 98%    BMI 22.93 kg/m   Outpatient Encounter Medications as of 11/24/2021  Medication Sig   acetaminophen (TYLENOL) 650 MG CR tablet Take 650 mg by mouth every 12 (twelve) hours. 9 am and 9 pm   albuterol (VENTOLIN HFA) 108 (90 Base) MCG/ACT inhaler Inhale 2 puffs into the lungs at bedtime. Wait at least 5 minutes between use of multiple inhalers. [DX: Chronic obstructive pulmonary disease, unspecified]   alum & mag hydroxide-simeth (MAALOX PLUS) 400-400-40 MG/5ML suspension Take 30 mLs by mouth every 8 (eight) hours as needed for indigestion.   ANORO ELLIPTA 62.5-25 MCG/INH AEPB Inhale 1 puff into the lungs daily.    apixaban (ELIQUIS) 2.5 MG TABS tablet Take 1 tablet (2.5 mg total) by mouth 2 (two) times daily. Resume  07/20/17   artificial tears (LACRILUBE) OINT ophthalmic ointment Place 1 application into both eyes at bedtime.   Carboxymethylcellulose Sodium (ARTIFICIAL TEARS OP) Apply to eye. Artificial Tears Opthalmic GEL Apply to both eyes at night. Wait 5 minutes between multiple applications in same eye.  Apply to both eyes daily as needed for irritation/dryness. Wait at least 5 minutes between multiple opth administrations in same eye.   cycloSPORINE (RESTASIS) 0.05 % ophthalmic emulsion Place 1 drop into both eyes 2 (two) times daily.   diclofenac Sodium (VOLTAREN) 1 % GEL Apply 2 g topically 3 (three) times daily. Special Instructions: apply to left shoulder bilateral knees left ankle for pain management   diltiazem (CARDIZEM CD) 180 MG 24 hr capsule TAKE ONE CAPSULE BY MOUTH ONCE DAILY.   furosemide (LASIX) 40 MG tablet Take 40 mg by mouth 2 (two) times daily. Chronic diastolic (congestive) heart failure   gabapentin (NEURONTIN) 100 MG capsule Take 200 mg by mouth 2 (two) times daily. Bilateral Lower Extremity Neuropathic Pain   Melatonin 5 MG TABS Take 5 mg by mouth at bedtime as needed.    NON FORMULARY Diet Type:  Regular   Nutritional Supplements (ENSURE ENLIVE PO) Take 237 mLs by mouth daily.   OXYGEN Inhale 2 L into the lungs at bedtime.    pantoprazole (PROTONIX) 40 MG tablet Take 40 mg by mouth 2 (two) times daily.   polyethylene glycol (MIRALAX / GLYCOLAX) packet Take 17 g by mouth daily as needed. For constipation   potassium chloride SA (KLOR-CON) 20 MEQ tablet Take 20 mEq by mouth 2 (two) times daily.   traMADol (ULTRAM) 50 MG tablet Take 0.5 tablets (25 mg total) by mouth every 8 (eight) hours.   Polyethyl Glycol-Propyl Glycol (SYSTANE OP) Place 1 drop into both eyes every 4 (four) hours. For dry eyes  Administer 15 minutes after administering Restasis eye drops.   [DISCONTINUED] Menthol, Topical Analgesic, (BIOFREEZE) 4 % GEL Apply 1 application topically in the morning and at bedtime.  To shoulders and knees   No facility-administered encounter medications on file as of 11/24/2021.     SIGNIFICANT DIAGNOSTIC EXAMS   PREVIOUS  06-15-21: chest x-ray:  Enlargement  is noted possible with some degree of vascular engorgement Oblate appearance could represent a large pericardial effusion.   NO NEW EXAMS.       LABS REVIEWED: PREVIOUS    05-18-21: wbc 4.5; hgb 12.3; hct 39.6; mcv 86.3 plt 169; glucose 83; bun 21; creat 0.88; k+ 3.7; na++ 138; ca 8.5  GFR 59; liver normal albumin 3.3 06-15-21: wbc 3.7; hgb 13.4 hct 43.3; mcv 86.3 plt 164; glucose 98; bun 16; creat 1.02; k+ 3.6; na++ 140; ca 8.4 GFR 49; d-dimer 0.75; CPR 1.0 06-22-21: glucose 72; bun 15; creat 0.88; k+ 3.3; na++ 140; ca 8.1; GFR 59 06-26-21: k+ 3.6     NO NEW LABS.   Review of Systems  Constitutional:  Negative for malaise/fatigue.  Respiratory:  Negative for cough and shortness of breath.   Cardiovascular:  Negative for chest pain, palpitations and leg swelling.  Gastrointestinal:  Negative for abdominal pain, constipation and heartburn.  Musculoskeletal:  Negative for back pain, joint pain and myalgias.  Skin: Negative.   Neurological:  Negative for dizziness.  Psychiatric/Behavioral:  The patient is not nervous/anxious.    Physical Exam Constitutional:      General: She is not in acute distress.    Appearance: She is well-developed. She is not diaphoretic.  Neck:     Thyroid: No thyromegaly.  Cardiovascular:     Rate and Rhythm: Normal rate and regular rhythm.     Pulses: Normal pulses.     Heart sounds: Normal heart sounds.  Pulmonary:     Effort: Pulmonary effort is normal. No respiratory distress.     Breath sounds: Normal breath sounds.  Abdominal:     General: Bowel sounds are normal. There is no distension.     Palpations: Abdomen is soft.     Tenderness: There is no abdominal tenderness.  Musculoskeletal:        General: Normal range of motion.     Cervical back: Neck supple.      Right lower leg: Edema present.     Left lower leg: Edema present.     Comments:  2-3+ bilateral lower extremity edema   Lymphadenopathy:     Cervical: No cervical adenopathy.  Skin:    General: Skin is warm and dry.  Neurological:     Mental Status: She is alert. Mental status is at baseline.  Psychiatric:        Mood and Affect: Mood normal.      ASSESSMENT/ PLAN:  TODAY  Aortic atherosclerosis Chronic diastolic heart failure Cerebrovascular accident (CVA) unspecified mechanism  Will continue current medications Will continue current plan of care Will continue to monitor her status.    Time spent with patient: 40 minutes: medications; plan of care.    Ok Edwards NP [facility]  Contact [phone] Monday through Friday 8am- 5pm  After hours call [phone]

## 2021-11-27 ENCOUNTER — Other Ambulatory Visit (HOSPITAL_COMMUNITY)
Admission: RE | Admit: 2021-11-27 | Discharge: 2021-11-27 | Disposition: A | Discharge status: Home / Self Care | Payer: Medicare Other | Source: Skilled Nursing Facility | Attending: Adult Health | Admitting: Adult Health

## 2021-11-27 DIAGNOSIS — I130 Hypertensive heart and chronic kidney disease with heart failure and stage 1 through stage 4 chronic kidney disease, or unspecified chronic kidney disease: Secondary | ICD-10-CM | POA: Diagnosis not present

## 2021-11-27 LAB — LIPID PANEL
Cholesterol: 149 mg/dL (ref 0–200)
HDL: 62 mg/dL (ref >40)
LDL Cholesterol: 78 mg/dL (ref 0–99)
Total CHOL/HDL Ratio: 2.4 RATIO
Triglycerides: 44 mg/dL (ref <150)
VLDL: 9 mg/dL (ref 0–40)

## 2021-12-11 ENCOUNTER — Other Ambulatory Visit (HOSPITAL_COMMUNITY)
Admission: RE | Admit: 2021-12-11 | Discharge: 2021-12-11 | Disposition: A | Discharge status: Home / Self Care | Payer: Medicare Other | Source: Skilled Nursing Facility | Attending: Adult Health | Admitting: Adult Health

## 2021-12-11 DIAGNOSIS — I130 Hypertensive heart and chronic kidney disease with heart failure and stage 1 through stage 4 chronic kidney disease, or unspecified chronic kidney disease: Secondary | ICD-10-CM | POA: Diagnosis not present

## 2021-12-11 LAB — COMPREHENSIVE METABOLIC PANEL
ALT: 11 U/L (ref 0–44)
AST: 16 U/L (ref 15–41)
Albumin: 3.2 g/dL — ABNORMAL LOW (ref 3.5–5.0)
Alkaline Phosphatase: 86 U/L (ref 38–126)
Anion gap: 11 (ref 5–15)
BUN: 31 mg/dL — ABNORMAL HIGH (ref 8–23)
CO2: 26 mmol/L (ref 22–32)
Calcium: 8.2 mg/dL — ABNORMAL LOW (ref 8.9–10.3)
Chloride: 102 mmol/L (ref 98–111)
Creatinine, Ser: 1.24 mg/dL — ABNORMAL HIGH (ref 0.44–1.00)
GFR, Estimated: 39 mL/min — ABNORMAL LOW (ref >60)
Glucose, Bld: 69 mg/dL — ABNORMAL LOW (ref 70–99)
Potassium: 4.5 mmol/L (ref 3.5–5.1)
Sodium: 139 mmol/L (ref 135–145)
Total Bilirubin: 0.6 mg/dL (ref 0.3–1.2)
Total Protein: 6 g/dL — ABNORMAL LOW (ref 6.5–8.1)

## 2021-12-11 LAB — CBC
HCT: 39.3 % (ref 36.0–46.0)
Hemoglobin: 12.3 g/dL (ref 12.0–15.0)
MCH: 27.6 pg (ref 26.0–34.0)
MCHC: 31.3 g/dL (ref 30.0–36.0)
MCV: 88.3 fL (ref 80.0–100.0)
Platelets: 169 10*3/uL (ref 150–400)
RBC: 4.45 MIL/uL (ref 3.87–5.11)
RDW: 14.5 % (ref 11.5–15.5)
WBC: 5.6 10*3/uL (ref 4.0–10.5)
nRBC: 0 % (ref 0.0–0.2)

## 2021-12-12 ENCOUNTER — Other Ambulatory Visit: Payer: Self-pay | Admitting: Adult Health

## 2021-12-12 MED ORDER — TRAMADOL HCL 50 MG PO TABS: 25.0000 mg | ORAL_TABLET | Freq: Three times a day (TID) | ORAL | 0 refills | Status: AC

## 2021-12-14 DIAGNOSIS — I130 Hypertensive heart and chronic kidney disease with heart failure and stage 1 through stage 4 chronic kidney disease, or unspecified chronic kidney disease: Secondary | ICD-10-CM | POA: Diagnosis not present

## 2021-12-14 DIAGNOSIS — Z1159 Encounter for screening for other viral diseases: Secondary | ICD-10-CM | POA: Diagnosis not present

## 2021-12-14 DIAGNOSIS — I63312 Cerebral infarction due to thrombosis of left middle cerebral artery: Secondary | ICD-10-CM | POA: Diagnosis not present

## 2021-12-18 ENCOUNTER — Encounter: Payer: Self-pay | Admitting: Adult Health

## 2021-12-18 ENCOUNTER — Inpatient Hospital Stay (INDEPENDENT_AMBULATORY_CARE_PROVIDER_SITE_OTHER): Payer: Medicare Other

## 2021-12-18 ENCOUNTER — Non-Acute Institutional Stay (SKILLED_NURSING_FACILITY): Payer: Medicare Other | Admitting: Adult Health

## 2021-12-18 ENCOUNTER — Ambulatory Visit (INDEPENDENT_AMBULATORY_CARE_PROVIDER_SITE_OTHER): Payer: Medicare Other | Admitting: Podiatry

## 2021-12-18 DIAGNOSIS — G588 Other specified mononeuropathies: Secondary | ICD-10-CM | POA: Diagnosis not present

## 2021-12-18 DIAGNOSIS — L03032 Cellulitis of left toe: Secondary | ICD-10-CM

## 2021-12-18 DIAGNOSIS — L97522 Non-pressure chronic ulcer of other part of left foot with fat layer exposed: Secondary | ICD-10-CM | POA: Diagnosis not present

## 2021-12-18 DIAGNOSIS — L97529 Non-pressure chronic ulcer of other part of left foot with unspecified severity: Secondary | ICD-10-CM | POA: Diagnosis not present

## 2021-12-18 DIAGNOSIS — N1831 Chronic kidney disease, stage 3a: Secondary | ICD-10-CM | POA: Diagnosis not present

## 2021-12-18 DIAGNOSIS — K219 Gastro-esophageal reflux disease without esophagitis: Secondary | ICD-10-CM

## 2021-12-18 MED ORDER — DOXYCYCLINE HYCLATE 100 MG PO TABS: 100.0000 mg | ORAL_TABLET | Freq: Two times a day (BID) | ORAL | 0 refills | Status: AC

## 2021-12-18 NOTE — Progress Notes (Signed)
Location:  [facility] Room Number: 108-W Place of Service:  SNF (31)   CODE STATUS: DNR  Allergies  Allergen Reactions   Elemental Sulfur Swelling and Rash   Chief Complaint  Patient presents with   Medical Management of Chronic Issues             Chronic kidney disease stage 3 (moderate) . Peripheral neuropathy: GERD without esophagitis:     HPI:  She is a 88 year old long term resident of this facility being seen for the management of her chronic illnesses: Chronic kidney disease stage 3 (moderate) . Peripheral neuropathy: GERD without esophagitis. There are no reports of uncontrolled pain. She has had a steroid injection to her left shoulder. She was seen by podiatry today had a callus; had "something" done to area has an ulceration present. Her left foot is red which is chronic. She was placed on abt.   Past Medical History:  Diagnosis Date   Atrial fibrillation (HCC)    Bradycardia    Breast nodule 11/15/2014   Breast pain, left 11/15/2014   Carotid artery disease (HCC)    Dizziness    Dyspnea    Previous CPX suggesting possible restrictive physiology, respiratory muscle fatigue, diastolic dysfunction   Essential hypertension, benign    Hyperlipidemia    Oxygen dependent    2 liter   Pacemaker [employer]    Rib pain on left side 11/15/2014   Seasonal allergies    Shingles 05/04/2015   Stroke ([place])    Toe fracture, left    left big toe    Past Surgical History:  Procedure Laterality Date   COLONOSCOPY  02/22/2012   Procedure: COLONOSCOPY;  Surgeon: Rogene Houston, MD;  [facility];  Service: Endoscopy;  Laterality: N/A;  100   KNEE SURGERY     bilateral   PACEMAKER IMPLANT N/A 07/17/2017   Procedure: Pacemaker Implant;  Surgeon: Deboraha Sprang, MD;  Location: [facility];  Service: Cardiovascular;  Laterality: N/A;    Social History   Socioeconomic History   Marital status: Widowed    Spouse name: Not on file    Number of children: Not on file   Years of education: Not on file   Highest education level: Not on file  Occupational History   Not on file  Tobacco Use   Smoking status: Never   Smokeless tobacco: Never  Vaping Use   Vaping Use: Never used  Substance and Sexual Activity   Alcohol use: No   Drug use: Yes    Types: Amyl nitrate   Sexual activity: Never    Birth control/protection: Post-menopausal  Other Topics Concern   Not on file  Social History Narrative   Not on file   Social Determinants of Health   Financial Resource Strain: Not on file  Food Insecurity: Not on file  Transportation Needs: Not on file  Physical Activity: Not on file  Stress: Not on file  Social Connections: Not on file  Intimate Partner Violence: Not on file   Family History  Problem Relation Age of Onset   Stroke Mother    Pneumonia Father    Heart disease Father    Healthy Son    Glaucoma Daughter    Emphysema Daughter    Healthy Daughter    Other Daughter        had knee replacement   Healthy Son    Heart disease Maternal Grandmother  VITAL SIGNS BP 130/66    Pulse 69    Temp 97.8 F (36.6 C)    Resp 18    Ht 5\' 4"  (1.626 m)    Wt 139 lb 9.6 oz (63.3 kg)    SpO2 96%    BMI 23.96 kg/m   Outpatient Encounter Medications as of 12/18/2021  Medication Sig   acetaminophen (TYLENOL) 650 MG CR tablet Take 650 mg by mouth every 12 (twelve) hours. 9 am and 9 pm   albuterol (VENTOLIN HFA) 108 (90 Base) MCG/ACT inhaler Inhale 2 puffs into the lungs at bedtime. Wait at least 5 minutes between use of multiple inhalers. [DX: Chronic obstructive pulmonary disease, unspecified]   alum & mag hydroxide-simeth (MAALOX PLUS) 400-400-40 MG/5ML suspension Take 30 mLs by mouth every 8 (eight) hours as needed for indigestion.   ANORO ELLIPTA 62.5-25 MCG/INH AEPB Inhale 1 puff into the lungs daily.    apixaban (ELIQUIS) 2.5 MG TABS tablet Take 1 tablet (2.5 mg total) by mouth 2 (two) times daily. Resume  07/20/17   artificial tears (LACRILUBE) OINT ophthalmic ointment Place 1 application into both eyes at bedtime.   Carboxymethylcellulose Sodium (ARTIFICIAL TEARS OP) Apply to eye. Artificial Tears Opthalmic GEL Apply to both eyes at night. Wait 5 minutes between multiple applications in same eye.  Apply to both eyes daily as needed for irritation/dryness. Wait at least 5 minutes between multiple opth administrations in same eye.   cycloSPORINE (RESTASIS) 0.05 % ophthalmic emulsion Place 1 drop into both eyes 2 (two) times daily.   diclofenac Sodium (VOLTAREN) 1 % GEL Apply 2 g topically 3 (three) times daily. Special Instructions: apply to left shoulder bilateral knees left ankle for pain management   diltiazem (CARDIZEM CD) 180 MG 24 hr capsule TAKE ONE CAPSULE BY MOUTH ONCE DAILY.   furosemide (LASIX) 40 MG tablet Take 40 mg by mouth 2 (two) times daily. Chronic diastolic (congestive) heart failure   gabapentin (NEURONTIN) 100 MG capsule Take 200 mg by mouth 2 (two) times daily. Bilateral Lower Extremity Neuropathic Pain   Melatonin 5 MG TABS Take 5 mg by mouth at bedtime as needed.    NON FORMULARY Diet Type:  Regular   Nutritional Supplements (ENSURE ENLIVE PO) Take 237 mLs by mouth daily.   OXYGEN Inhale 2 L into the lungs at bedtime.    pantoprazole (PROTONIX) 40 MG tablet Take 40 mg by mouth 2 (two) times daily.   polyethylene glycol (MIRALAX / GLYCOLAX) packet Take 17 g by mouth daily as needed. For constipation   potassium chloride SA (KLOR-CON) 20 MEQ tablet Take 20 mEq by mouth 2 (two) times daily.   traMADol (ULTRAM) 50 MG tablet Take 0.5 tablets (25 mg total) by mouth every 8 (eight) hours.   Polyethyl Glycol-Propyl Glycol (SYSTANE OP) Place 1 drop into both eyes every 4 (four) hours. For dry eyes  Administer 15 minutes after administering Restasis eye drops.   No facility-administered encounter medications on file as of 12/18/2021.     SIGNIFICANT DIAGNOSTIC  EXAMS  PREVIOUS  06-15-21: chest x-ray:  Enlargement  is noted possible with some degree of vascular engorgement Oblate appearance could represent a large pericardial effusion.   NO NEW EXAMS.       LABS REVIEWED: PREVIOUS    05-18-21: wbc 4.5; hgb 12.3; hct 39.6; mcv 86.3 plt 169; glucose 83; bun 21; creat 0.88; k+ 3.7; na++ 138; ca 8.5 GFR 59; liver normal albumin 3.3 06-15-21: wbc 3.7; hgb 13.4 hct 43.3;  mcv 86.3 plt 164; glucose 98; bun 16; creat 1.02; k+ 3.6; na++ 140; ca 8.4 GFR 49; d-dimer 0.75; CPR 1.0 06-22-21: glucose 72; bun 15; creat 0.88; k+ 3.3; na++ 140; ca 8.1; GFR 59 06-26-21: k+ 3.6     TODAY  11-27-21: chol 149; ldl 78 trig 44 hdl 62 12-11-21: wbc 5.6; hgb 12.3; hct 39.3; mcv 88.3 plt 169; glucose 69; bun 31; creat 1.24; k+ 4.5; na++ 139; ca 8.2; GFR 39; liver normal albumin 3.2    Review of Systems  Constitutional:  Negative for malaise/fatigue.  Respiratory:  Negative for cough and shortness of breath.   Cardiovascular:  Negative for chest pain, palpitations and leg swelling.  Gastrointestinal:  Negative for abdominal pain, constipation and heartburn.  Musculoskeletal:  Negative for back pain, joint pain and myalgias.  Skin:        Ulceration left foot   Neurological:  Negative for dizziness.  Psychiatric/Behavioral:  The patient is not nervous/anxious.    Physical Exam Constitutional:      General: She is not in acute distress.    Appearance: She is well-developed. She is not diaphoretic.  Neck:     Thyroid: No thyromegaly.  Cardiovascular:     Rate and Rhythm: Normal rate and regular rhythm.     Heart sounds: Normal heart sounds.  Pulmonary:     Effort: Pulmonary effort is normal. No respiratory distress.     Breath sounds: Normal breath sounds.  Abdominal:     General: Bowel sounds are normal. There is no distension.     Palpations: Abdomen is soft.     Tenderness: There is no abdominal tenderness.  Musculoskeletal:     Cervical back: Neck supple.      Right lower leg: Edema present.     Left lower leg: Edema present.     Comments: 3+ bilateral lower extremity edema  Lymphadenopathy:     Cervical: No cervical adenopathy.  Skin:    General: Skin is warm and dry.     Comments: 2 cm left great toe ulceration with necrosis present; area was a callus.   Neurological:     Mental Status: She is alert and oriented to person, place, and time.  Psychiatric:        Mood and Affect: Mood normal.    ASSESSMENT/ PLAN:  TODAY;   Chronic kidney disease stage 3 (moderate) is without change bun 31; creat 1.24; GFR 39  2. Peripheral neuropathy: is stable will continue gabapentin 200 mg twice daily   3. GERD without esophagitis: is stable will continue protonix 40 mg twice daily   4. Left great toe ulceration: was seen by podiatry will begin doxycyline 100 mg twice daily for 10 days; will apply betadine to wound daily with dressing.   PREVIOUS   5. Chronic generalized pain: is worse will continue tylenol cr 650 mg every 6 hours; ultram 25 mg three times daily  voltaren gel 2 gm to both shoulders twice daily   6. CVA (cerebrovascular accident): is stable will continue eliquis 2.5 mg twice daily   7. Complete heart block is status post [employer] PTV/DP pacemaker will monitor   8. GERD without esophagitis is stable will continue protonix 40 mg twice daily   9. Dyslipidemia is stable is off lipitor  10. Chronic constipation: is stable will continue miralax daily as needed  11. Chronic diastolic heart failure EF 60-65% (06-21-17) is stable will continue lasix 20 mg twice daily   12. Long standing  persistent atrial fibrillation: is status post pace maker; will continue cardizem cd 180 mg daily for rate control eliquis 2.5 mg twice daily   13. Hypertensive heart and kidney disease with chronic diastolic congestive heart failure and stage 3 chronic kidney disease: is stable 113/57 will continue cardizem cd 180 mg daily is off norvasc  14. Aortic  atherosclerosis  15. Chronic obstructive pulmonary disease is stable 02 dependent will continue symbicort 62.5/25 mcg 1 puff daily; albuterol 2 puffs nightly    Ok Edwards NP [facility]   call [phone]

## 2021-12-20 ENCOUNTER — Emergency Department (HOSPITAL_COMMUNITY): Payer: Medicare Other

## 2021-12-20 ENCOUNTER — Inpatient Hospital Stay (HOSPITAL_COMMUNITY): Payer: Medicare Other

## 2021-12-20 ENCOUNTER — Encounter (HOSPITAL_COMMUNITY): Payer: Self-pay

## 2021-12-20 ENCOUNTER — Inpatient Hospital Stay (HOSPITAL_COMMUNITY)
Admission: EM | Admit: 2021-12-20 | Discharge: 2022-01-10 | DRG: 871 | Disposition: E | Payer: Medicare Other | Attending: Internal Medicine | Admitting: Internal Medicine

## 2021-12-20 ENCOUNTER — Encounter: Payer: Self-pay | Admitting: Adult Health

## 2021-12-20 ENCOUNTER — Non-Acute Institutional Stay (SKILLED_NURSING_FACILITY): Payer: Medicare Other | Admitting: Adult Health

## 2021-12-20 DIAGNOSIS — A4150 Gram-negative sepsis, unspecified: Secondary | ICD-10-CM

## 2021-12-20 DIAGNOSIS — M869 Osteomyelitis, unspecified: Secondary | ICD-10-CM | POA: Diagnosis present

## 2021-12-20 DIAGNOSIS — I70229 Atherosclerosis of native arteries of extremities with rest pain, unspecified extremity: Secondary | ICD-10-CM

## 2021-12-20 DIAGNOSIS — M79672 Pain in left foot: Secondary | ICD-10-CM | POA: Diagnosis not present

## 2021-12-20 DIAGNOSIS — I4510 Unspecified right bundle-branch block: Secondary | ICD-10-CM | POA: Diagnosis present

## 2021-12-20 DIAGNOSIS — J9601 Acute respiratory failure with hypoxia: Secondary | ICD-10-CM

## 2021-12-20 DIAGNOSIS — F0390 Unspecified dementia without behavioral disturbance: Secondary | ICD-10-CM | POA: Diagnosis present

## 2021-12-20 DIAGNOSIS — R0902 Hypoxemia: Secondary | ICD-10-CM | POA: Diagnosis not present

## 2021-12-20 DIAGNOSIS — A4159 Other Gram-negative sepsis: Secondary | ICD-10-CM | POA: Diagnosis not present

## 2021-12-20 DIAGNOSIS — I5032 Chronic diastolic (congestive) heart failure: Secondary | ICD-10-CM | POA: Diagnosis present

## 2021-12-20 DIAGNOSIS — R52 Pain, unspecified: Secondary | ICD-10-CM | POA: Diagnosis not present

## 2021-12-20 DIAGNOSIS — I130 Hypertensive heart and chronic kidney disease with heart failure and stage 1 through stage 4 chronic kidney disease, or unspecified chronic kidney disease: Secondary | ICD-10-CM | POA: Diagnosis present

## 2021-12-20 DIAGNOSIS — I517 Cardiomegaly: Secondary | ICD-10-CM | POA: Diagnosis not present

## 2021-12-20 DIAGNOSIS — R609 Edema, unspecified: Secondary | ICD-10-CM | POA: Diagnosis not present

## 2021-12-20 DIAGNOSIS — K219 Gastro-esophageal reflux disease without esophagitis: Secondary | ICD-10-CM | POA: Diagnosis present

## 2021-12-20 DIAGNOSIS — M7989 Other specified soft tissue disorders: Secondary | ICD-10-CM | POA: Diagnosis not present

## 2021-12-20 DIAGNOSIS — Z515 Encounter for palliative care: Secondary | ICD-10-CM

## 2021-12-20 DIAGNOSIS — N179 Acute kidney failure, unspecified: Secondary | ICD-10-CM | POA: Diagnosis present

## 2021-12-20 DIAGNOSIS — Z79891 Long term (current) use of opiate analgesic: Secondary | ICD-10-CM

## 2021-12-20 DIAGNOSIS — G9341 Metabolic encephalopathy: Secondary | ICD-10-CM | POA: Diagnosis present

## 2021-12-20 DIAGNOSIS — A419 Sepsis, unspecified organism: Secondary | ICD-10-CM | POA: Diagnosis not present

## 2021-12-20 DIAGNOSIS — Z7901 Long term (current) use of anticoagulants: Secondary | ICD-10-CM

## 2021-12-20 DIAGNOSIS — Z8619 Personal history of other infectious and parasitic diseases: Secondary | ICD-10-CM

## 2021-12-20 DIAGNOSIS — R7989 Other specified abnormal findings of blood chemistry: Secondary | ICD-10-CM | POA: Diagnosis present

## 2021-12-20 DIAGNOSIS — I442 Atrioventricular block, complete: Secondary | ICD-10-CM | POA: Diagnosis present

## 2021-12-20 DIAGNOSIS — R230 Cyanosis: Secondary | ICD-10-CM | POA: Insufficient documentation

## 2021-12-20 DIAGNOSIS — N1831 Chronic kidney disease, stage 3a: Secondary | ICD-10-CM | POA: Diagnosis present

## 2021-12-20 DIAGNOSIS — J9621 Acute and chronic respiratory failure with hypoxia: Secondary | ICD-10-CM | POA: Diagnosis not present

## 2021-12-20 DIAGNOSIS — R079 Chest pain, unspecified: Secondary | ICD-10-CM | POA: Diagnosis not present

## 2021-12-20 DIAGNOSIS — Z66 Do not resuscitate: Secondary | ICD-10-CM | POA: Diagnosis not present

## 2021-12-20 DIAGNOSIS — Z8673 Personal history of transient ischemic attack (TIA), and cerebral infarction without residual deficits: Secondary | ICD-10-CM

## 2021-12-20 DIAGNOSIS — I4891 Unspecified atrial fibrillation: Secondary | ICD-10-CM | POA: Diagnosis present

## 2021-12-20 DIAGNOSIS — R6521 Severe sepsis with septic shock: Secondary | ICD-10-CM | POA: Diagnosis present

## 2021-12-20 DIAGNOSIS — I4819 Other persistent atrial fibrillation: Secondary | ICD-10-CM | POA: Diagnosis present

## 2021-12-20 DIAGNOSIS — G629 Polyneuropathy, unspecified: Secondary | ICD-10-CM | POA: Diagnosis not present

## 2021-12-20 DIAGNOSIS — Z8249 Family history of ischemic heart disease and other diseases of the circulatory system: Secondary | ICD-10-CM | POA: Diagnosis not present

## 2021-12-20 DIAGNOSIS — B964 Proteus (mirabilis) (morganii) as the cause of diseases classified elsewhere: Secondary | ICD-10-CM | POA: Diagnosis present

## 2021-12-20 DIAGNOSIS — E875 Hyperkalemia: Secondary | ICD-10-CM | POA: Diagnosis present

## 2021-12-20 DIAGNOSIS — E872 Acidosis, unspecified: Secondary | ICD-10-CM | POA: Diagnosis present

## 2021-12-20 DIAGNOSIS — I639 Cerebral infarction, unspecified: Secondary | ICD-10-CM

## 2021-12-20 DIAGNOSIS — U071 COVID-19: Secondary | ICD-10-CM | POA: Diagnosis present

## 2021-12-20 DIAGNOSIS — Z882 Allergy status to sulfonamides status: Secondary | ICD-10-CM

## 2021-12-20 DIAGNOSIS — Z9981 Dependence on supplemental oxygen: Secondary | ICD-10-CM

## 2021-12-20 DIAGNOSIS — R6889 Other general symptoms and signs: Secondary | ICD-10-CM | POA: Diagnosis not present

## 2021-12-20 DIAGNOSIS — R8281 Pyuria: Secondary | ICD-10-CM | POA: Diagnosis present

## 2021-12-20 DIAGNOSIS — R0602 Shortness of breath: Secondary | ICD-10-CM

## 2021-12-20 DIAGNOSIS — E785 Hyperlipidemia, unspecified: Secondary | ICD-10-CM | POA: Diagnosis present

## 2021-12-20 DIAGNOSIS — Z823 Family history of stroke: Secondary | ICD-10-CM

## 2021-12-20 DIAGNOSIS — Z79899 Other long term (current) drug therapy: Secondary | ICD-10-CM

## 2021-12-20 DIAGNOSIS — L97529 Non-pressure chronic ulcer of other part of left foot with unspecified severity: Secondary | ICD-10-CM | POA: Insufficient documentation

## 2021-12-20 DIAGNOSIS — I10 Essential (primary) hypertension: Secondary | ICD-10-CM

## 2021-12-20 DIAGNOSIS — Z743 Need for continuous supervision: Secondary | ICD-10-CM | POA: Diagnosis not present

## 2021-12-20 DIAGNOSIS — Z950 Presence of cardiac pacemaker: Secondary | ICD-10-CM

## 2021-12-20 LAB — LACTIC ACID, PLASMA
Lactic Acid, Venous: 8.7 mmol/L (ref 0.5–1.9)
Lactic Acid, Venous: >9 mmol/L (ref 0.5–1.9)

## 2021-12-20 LAB — COMPREHENSIVE METABOLIC PANEL
ALT: 24 U/L (ref 0–44)
AST: 44 U/L — ABNORMAL HIGH (ref 15–41)
Albumin: 3.2 g/dL — ABNORMAL LOW (ref 3.5–5.0)
Alkaline Phosphatase: 122 U/L (ref 38–126)
Anion gap: 17 — ABNORMAL HIGH (ref 5–15)
BUN: 37 mg/dL — ABNORMAL HIGH (ref 8–23)
CO2: 16 mmol/L — ABNORMAL LOW (ref 22–32)
Calcium: 8.6 mg/dL — ABNORMAL LOW (ref 8.9–10.3)
Chloride: 104 mmol/L (ref 98–111)
Creatinine, Ser: 2.23 mg/dL — ABNORMAL HIGH (ref 0.44–1.00)
GFR, Estimated: 19 mL/min — ABNORMAL LOW (ref >60)
Glucose, Bld: 103 mg/dL — ABNORMAL HIGH (ref 70–99)
Potassium: 6.2 mmol/L — ABNORMAL HIGH (ref 3.5–5.1)
Sodium: 137 mmol/L (ref 135–145)
Total Bilirubin: 1.5 mg/dL — ABNORMAL HIGH (ref 0.3–1.2)
Total Protein: 6.5 g/dL (ref 6.5–8.1)

## 2021-12-20 LAB — URINALYSIS, COMPLETE (UACMP) WITH MICROSCOPIC
Bilirubin Urine: NEGATIVE
Glucose, UA: NEGATIVE mg/dL
Ketones, ur: NEGATIVE mg/dL
Nitrite: NEGATIVE
Protein, ur: 100 mg/dL — AB
Specific Gravity, Urine: 1.018 (ref 1.005–1.030)
WBC, UA: >50 WBC/hpf — ABNORMAL HIGH (ref 0–5)
pH: 5 (ref 5.0–8.0)

## 2021-12-20 LAB — CBC WITH DIFFERENTIAL/PLATELET
Abs Immature Granulocytes: 0.39 10*3/uL — ABNORMAL HIGH (ref 0.00–0.07)
Basophils Absolute: 0.1 10*3/uL (ref 0.0–0.1)
Basophils Relative: 0 %
Eosinophils Absolute: 0 10*3/uL (ref 0.0–0.5)
Eosinophils Relative: 0 %
HCT: 41.7 % (ref 36.0–46.0)
Hemoglobin: 12.6 g/dL (ref 12.0–15.0)
Immature Granulocytes: 2 %
Lymphocytes Relative: 4 %
Lymphs Abs: 0.9 10*3/uL (ref 0.7–4.0)
MCH: 27.1 pg (ref 26.0–34.0)
MCHC: 30.2 g/dL (ref 30.0–36.0)
MCV: 89.7 fL (ref 80.0–100.0)
Monocytes Absolute: 1.8 10*3/uL — ABNORMAL HIGH (ref 0.1–1.0)
Monocytes Relative: 8 %
Neutro Abs: 18.8 10*3/uL — ABNORMAL HIGH (ref 1.7–7.7)
Neutrophils Relative %: 86 %
Platelets: 211 10*3/uL (ref 150–400)
RBC: 4.65 MIL/uL (ref 3.87–5.11)
RDW: 15.1 % (ref 11.5–15.5)
WBC: 21.9 10*3/uL — ABNORMAL HIGH (ref 4.0–10.5)
nRBC: 0 % (ref 0.0–0.2)

## 2021-12-20 LAB — RESP PANEL BY RT-PCR (FLU A&B, COVID) ARPGX2
Influenza A by PCR: NEGATIVE
Influenza B by PCR: NEGATIVE
SARS Coronavirus 2 by RT PCR: POSITIVE — AB

## 2021-12-20 LAB — BRAIN NATRIURETIC PEPTIDE: B Natriuretic Peptide: 740 pg/mL — ABNORMAL HIGH (ref 0.0–100.0)

## 2021-12-20 MED ORDER — SODIUM CHLORIDE 0.9 % IV SOLN
1.0000 g | INTRAVENOUS | Status: DC
  Filled 2021-12-20: qty 1

## 2021-12-20 MED ORDER — ACETAMINOPHEN 325 MG PO TABS: 650.0000 mg | ORAL_TABLET | Freq: Four times a day (QID) | ORAL | Status: DC | PRN

## 2021-12-20 MED ORDER — ACETAMINOPHEN 650 MG RE SUPP: 650.0000 mg | Freq: Four times a day (QID) | RECTAL | Status: DC | PRN

## 2021-12-20 MED ORDER — PANTOPRAZOLE SODIUM 40 MG PO TBEC: 40.0000 mg | DELAYED_RELEASE_TABLET | Freq: Two times a day (BID) | ORAL | Status: DC

## 2021-12-20 MED ORDER — SODIUM CHLORIDE 0.9 % IV SOLN: INTRAVENOUS | Status: DC

## 2021-12-20 MED ORDER — SODIUM CHLORIDE 0.9 % IV SOLN
250.0000 mL | INTRAVENOUS | Status: DC
  Administered 2021-12-20: 250 mL via INTRAVENOUS

## 2021-12-20 MED ORDER — LACTATED RINGERS IV SOLN: INTRAVENOUS | Status: DC

## 2021-12-20 MED ORDER — ONDANSETRON HCL 4 MG PO TABS: 4.0000 mg | ORAL_TABLET | Freq: Four times a day (QID) | ORAL | Status: DC | PRN

## 2021-12-20 MED ORDER — SODIUM CHLORIDE 0.9 % IV BOLUS
1000.0000 mL | Freq: Once | INTRAVENOUS | Status: AC
  Administered 2021-12-20: 1000 mL via INTRAVENOUS

## 2021-12-20 MED ORDER — SODIUM BICARBONATE 8.4 % IV SOLN
50.0000 meq | Freq: Once | INTRAVENOUS | Status: AC
  Administered 2021-12-20: 50 meq via INTRAVENOUS
  Filled 2021-12-20: qty 50

## 2021-12-20 MED ORDER — SODIUM CHLORIDE 0.9 % IV SOLN
2.0000 g | Freq: Once | INTRAVENOUS | Status: AC
  Administered 2021-12-20: 2 g via INTRAVENOUS
  Filled 2021-12-20: qty 2

## 2021-12-20 MED ORDER — MORPHINE SULFATE (PF) 2 MG/ML IV SOLN
1.0000 mg | Freq: Once | INTRAVENOUS | Status: AC
  Administered 2021-12-20: 1 mg via INTRAVENOUS
  Filled 2021-12-20: qty 1

## 2021-12-20 MED ORDER — SODIUM ZIRCONIUM CYCLOSILICATE 5 G PO PACK
10.0000 g | PACK | Freq: Once | ORAL | Status: AC
  Administered 2021-12-20: 10 g via ORAL
  Filled 2021-12-20: qty 2

## 2021-12-20 MED ORDER — VANCOMYCIN HCL IN DEXTROSE 1-5 GM/200ML-% IV SOLN
1000.0000 mg | Freq: Once | INTRAVENOUS | Status: AC
  Administered 2021-12-20: 1000 mg via INTRAVENOUS
  Filled 2021-12-20: qty 200

## 2021-12-20 MED ORDER — VANCOMYCIN HCL 750 MG/150ML IV SOLN: 750.0000 mg | INTRAVENOUS | Status: DC

## 2021-12-20 MED ORDER — NOREPINEPHRINE 4 MG/250ML-% IV SOLN
2.0000 ug/min | INTRAVENOUS | Status: DC
  Administered 2021-12-20: 4 ug/min via INTRAVENOUS
  Filled 2021-12-20: qty 250

## 2021-12-20 MED ORDER — CYCLOSPORINE 0.05 % OP EMUL: 1.0000 [drp] | Freq: Two times a day (BID) | OPHTHALMIC | Status: DC

## 2021-12-20 MED ORDER — INSULIN ASPART 100 UNIT/ML IV SOLN
10.0000 [IU] | Freq: Once | INTRAVENOUS | Status: AC
  Administered 2021-12-20: 10 [IU] via INTRAVENOUS

## 2021-12-20 MED ORDER — SODIUM CHLORIDE 0.9 % IV BOLUS
1000.0000 mL | Freq: Once | INTRAVENOUS | Status: AC
Start: 2021-12-20 — End: 2021-12-20
  Administered 2021-12-20: 1000 mL via INTRAVENOUS

## 2021-12-20 MED ORDER — ONDANSETRON HCL 4 MG/2ML IJ SOLN: 4.0000 mg | Freq: Four times a day (QID) | INTRAMUSCULAR | Status: DC | PRN

## 2021-12-20 MED ORDER — DEXTROSE 50 % IV SOLN
1.0000 | Freq: Once | INTRAVENOUS | Status: AC
  Administered 2021-12-20: 50 mL via INTRAVENOUS
  Filled 2021-12-20: qty 50

## 2021-12-20 NOTE — ED Notes (Signed)
[facility] staff called to report pt's left foot is cold and turning blue since the callus was removed 2 days ago.

## 2021-12-20 NOTE — ED Notes (Signed)
Date and time results received: 12/24/2021 1715 (use smartphrase ".now" to insert current time)  Test: lactic acid Critical Value: >9.0  Name of Provider Notified: Dr. Carles Collet  Orders Received? Or Actions Taken?:  na

## 2021-12-20 NOTE — Progress Notes (Signed)
Pharmacy Antibiotic Note  Wendy Perkins a 88 y.o. female admitted on 12/14/2021 with sepsis.  Pharmacy has been consulted for vancomycin and cefepime dosing.  Plan: Vancomycin 750mg  IV every 48 hours.  Goal trough 15-20 mcg/mL. Cefepime 1gm IV every 24 hours.  Medical History: Past Medical History:  Diagnosis Date   Atrial fibrillation ([place])    Bradycardia    Breast nodule 11/15/2014   Breast pain, left 11/15/2014   Carotid artery disease ([facility])    Dizziness    Dyspnea    Previous CPX suggesting possible restrictive physiology, respiratory muscle fatigue, diastolic dysfunction   Essential hypertension, benign    Hyperlipidemia    Oxygen dependent    2 liter   Pacemaker [employer]    Rib pain on left side 11/15/2014   Seasonal allergies    Shingles 05/04/2015   Stroke ([place])    Toe fracture, left    left big toe    Allergies:  Allergies  Allergen Reactions   Elemental Sulfur Swelling and Rash    Filed Weights   01/02/2022 1047  Weight: 63 kg (139 lb)    CBC Latest Ref Rng & Units 12/19/2021 12/11/2021 06/15/2021  WBC 4.0 - 10.5 K/uL 21.9(H) 5.6 3.7(L)  Hemoglobin 12.0 - 15.0 g/dL 12.6 12.3 13.4  Hematocrit 36.0 - 46.0 % 41.7 39.3 43.3  Platelets 150 - 400 K/uL 211 169 164     Estimated Creatinine Clearance: 12.1 mL/min (A) (by C-G formula based on SCr of 2.23 mg/dL (H)).  Antibiotics Given (last 72 hours)     Date/Time Action Medication Dose Rate   12/29/2021 1232 New Bag/Given   vancomycin (VANCOCIN) IVPB 1000 mg/200 mL premix 1,000 mg 200 mL/hr   12/27/2021 1434 New Bag/Given   ceFEPIme (MAXIPIME) 2 g in sodium chloride 0.9 % 100 mL IVPB 2 g 200 mL/hr       Antimicrobials this admission:  Cefepime 12/10/2021  >>  vancomycin 12/31/2021  >>   Microbiology results: 12/22/2021  BCx: sent 12/14/2021  UCx: sent  Thank you for allowing pharmacy to be a part of this patients care.  Thomasenia Sales, PharmD Clinical Pharmacist

## 2021-12-20 NOTE — H&P (Addendum)
History and Physical  Wendy Perkins ZVJ:[id number] DOB: [birth year] DOA: 01/03/2022   PCP: Gerlene Fee, NP   Patient coming from: Home  Chief Complaint: left foot pain  HPI:  Wendy Perkins is a 88 y.o. female with medical history of 88 year old female with a history of complete heart block status post PPM, hypertension, hyperlipidemia, chronic respiratory failure on 2 L, persistent atrial fibrillation, peripheral neuropathy, GERD presenting with at least 2-day history of left foot pain.  Unfortunately, the patient is unable to provide any significant history secondary to her encephalopathy.  History is obtained from review of the medical record and speaking with the patient's family at the bedside.  Apparently, the patient had a callus on her left forefoot around the left MTP J area for which she saw podiatry on 12/18/2021.  Apparently,  the callus was trimmed on 12/18/2021.  The patient was started on doxycycline on the same day.  Daughter at bedside states that since then her left great toe has turned " dark".  The patient herself complains of some pain in her left foot, but she was unable to clarify the exact location, duration, or severity.  On the morning of 12/28/2021, the patient was noted to be somnolent with generalized weakness.  The patient herself denies any chest pain, abdominal pain, shortness of breath presently.  She did have an episode of nausea and vomiting in triage.  ED In the ED, the patient was afebrile with soft blood pressures down to 91/60.  Oxygen saturation was 94-96% on 2 L.  BMP showed sodium 137, potassium 6.2, bicarbonate 16, serum creatinine 2.23.  AST 44, ALT 24, alk phosphatase 122, T bilirubin 1.5.  WBC 21.9, hemoglobin 12.6, platelets 211.  The patient was given 2 L of normal saline.  Bicarbonate, D50, and insulin were given as temporizing measures for her high potassium.  The patient was started on vancomycin and cefepime.  Assessment/Plan: SIRS -Source  unclear presently, but suspect source of infection is the foot -Follow blood cultures -UA and urine culture -Chest x-ray without any consolidation or edema -Continue empiric vancomycin and cefepime  Left foot ischemia -see pics on my earlier progress note -I spoke with vascular surgery, Dr. Arthor Captain consult once patient [facility]  Acute on chronic renal failure--CKD 3a -Baseline creatinine 0.8-1.0 -Presents with serum creatinine 2.23 -Secondary to sepsis and hypotension  Hyperkalemia -Temporizing measures given -give dose Lokelma  Acute metabolic encephalopathy -Secondary to infectious process -Further work-up if no improvement with antibiotics  Lactic Acidosis -do not feel this is entirely from overwhelming sepsis as pt is afebrile and hemodynamically stable -feel there may be a component of renal failure and decreased peripheral perfusion from PVD -continue IVF  Persistent atrial fibrillation -She is chronically on apixaban which will be held in case she need surgical intervention -Rate controlled  Complete heart block -Status post permanent pacemaker -Patient follows with Dr. Crissie Sickles -Holding diltiazem secondary to soft blood pressure  Chronic diastolic CHF -Holding Lasix temporarily secondary to acute kidney injury  Peripheral neuropathy -Continue gabapentin  Goals of Care -seems to be disagreement amongst siblings after I spoke with brother and sister -remain FULL CODE for now  -palliative medicine consult       Past Medical History:  Diagnosis Date   Atrial fibrillation (H[facility])    Bradycardia    Breast nodule 11/15/2014   Breast pain, left 11/15/2014   Carotid artery disease ([facility])    Dizziness  Dyspnea    Previous CPX suggesting possible restrictive physiology, respiratory muscle fatigue, diastolic dysfunction   Essential hypertension, benign    Hyperlipidemia    Oxygen dependent    2 liter   Pacemaker [employer]    Rib pain on  left side 11/15/2014   Seasonal allergies    Shingles 05/04/2015   Stroke ([place])    Toe fracture, left    left big toe   Past Surgical History:  Procedure Laterality Date   COLONOSCOPY  02/22/2012   Procedure: COLONOSCOPY;  Surgeon: Rogene Houston, MD;  Location[facility];  Service: Endoscopy;  Laterality: N/A;  100   KNEE SURGERY     bilateral   PACEMAKER IMPLANT N/A 07/17/2017   Procedure: Pacemaker Implant;  Surgeon: Deboraha Sprang, MD;  Location: [facility];  Service: Cardiovascular;  Laterality: N/A;   Social History:  reports that she has never smoked. She has never used smokeless tobacco. She reports current drug use. Drug: Amyl nitrate. She reports that she does not drink alcohol.   Family History  Problem Relation Age of Onset   Stroke Mother    Pneumonia Father    Heart disease Father    Healthy Son    Glaucoma Daughter    Emphysema Daughter    Healthy Daughter    Other Daughter        had knee replacement   Healthy Son    Heart disease Maternal Grandmother      Allergies  Allergen Reactions   Elemental Sulfur Swelling and Rash     Prior to Admission medications   Medication Sig Start Date End Date Taking? Authorizing Provider  acetaminophen (TYLENOL) 650 MG CR tablet Take 650 mg by mouth 2 (two) times daily.   Yes [provider]  albuterol (VENTOLIN HFA) 108 (90 Base) MCG/ACT inhaler Inhale 2 puffs into the lungs at bedtime. Wait at least 5 minutes between use of multiple inhalers. [DX: Chronic obstructive pulmonary disease, unspecified]   Yes [provider]  alum & mag hydroxide-simeth (MAALOX PLUS) 400-400-40 MG/5ML suspension Take 30 mLs by mouth every 8 (eight) hours as needed for indigestion. 12/06/20  Yes [provider]  ANORO ELLIPTA 62.5-25 MCG/INH AEPB Inhale 1 puff into the lungs daily.  10/12/14  Yes [provider]  apixaban (ELIQUIS) 2.5 MG TABS tablet Take 1 tablet (2.5 mg total) by mouth 2 (two)  times daily. Resume 07/20/17 07/19/17  Yes Seiler, Luetta Nutting K, NP  artificial tears (LACRILUBE) OINT ophthalmic ointment Place 1 application into both eyes at bedtime.   Yes [provider]  Carboxymethylcellulose Sodium (ARTIFICIAL TEARS OP) Apply to eye. Artificial Tears Opthalmic GEL Apply to both eyes at night. Wait 5 minutes between multiple applications in same eye.  Apply to both eyes daily as needed for irritation/dryness. Wait at least 5 minutes between multiple opth administrations in same eye.   Yes [provider]  cycloSPORINE (RESTASIS) 0.05 % ophthalmic emulsion Place 1 drop into both eyes 2 (two) times daily.   Yes [provider]  diclofenac Sodium (VOLTAREN) 1 % GEL Apply 2 g topically 3 (three) times daily. Special Instructions: apply to left shoulder bilateral knees left ankle for pain management   Yes [provider]  diltiazem (CARDIZEM CD) 180 MG 24 hr capsule TAKE ONE CAPSULE BY MOUTH ONCE DAILY. Patient taking differently: 180 mg daily. 05/10/15  Yes Satira Sark, MD  doxycycline (VIBRA-TABS) 100 MG tablet Take 1 tablet (100  mg total) by mouth 2 (two) times daily. 12/18/21  Yes Edrick Kins, DPM  furosemide (LASIX) 40 MG tablet Take 40 mg by mouth 2 (two) times daily. Chronic diastolic (congestive) heart failure   Yes [provider]  gabapentin (NEURONTIN) 100 MG capsule Take 200 mg by mouth 2 (two) times daily. Bilateral Lower Extremity Neuropathic Pain 12/19/20  Yes [provider]  Melatonin 5 MG TABS Take 5 mg by mouth at bedtime as needed.    Yes [provider]  Nutritional Supplements (ENSURE ENLIVE PO) Take 237 mLs by mouth daily as needed. 07/26/19  Yes [provider]  pantoprazole (PROTONIX) 40 MG tablet Take 40 mg by mouth 2 (two) times daily. 10/07/20  Yes [provider]  polyethylene glycol (MIRALAX / GLYCOLAX) packet Take 17 g by mouth daily as needed. For constipation 01/01/18   Yes [provider]  potassium chloride SA (KLOR-CON) 20 MEQ tablet Take 20 mEq by mouth 2 (two) times daily.   Yes [provider]  traMADol (ULTRAM) 50 MG tablet Take 0.5 tablets (25 mg total) by mouth every 8 (eight) hours. Patient taking differently: Take 25 mg by mouth 2 (two) times daily. 12/12/21   Gerlene Fee, NP    Review of Systems:  Unobtainable due to encephalopathy  Physical Exam: Vitals:   12/15/2021 1130 01/09/2022 1200 01/05/2022 1230 12/22/2021 1258  BP: 105/60 91/60 107/65   Pulse: 60 60 (!) 58 60  Resp: 17 18 (!) 27 (!) 26  Temp:      TempSrc:      SpO2: 91% 95% 95% 96%  Weight:      Height:       General:  A&O x 3, NAD, nontoxic, pleasant/cooperative Head/Eye: No conjunctival hemorrhage, no icterus, [place]/AT, No nystagmus ENT:  No icterus,  No thrush, good dentition, no pharyngeal exudate Neck:  No masses, no lymphadenpathy, no bruits CV:  RRR, no rub, no gallop, no S3 Lung:  bibasilar crackles. No wheeze Abdomen: soft/NT, +BS, nondistended, no peritoneal signs Ext: cold left foot with cyanosis of left fore foot          Labs on Admission:  Basic Metabolic Panel: Recent Labs  Lab 12/19/2021 1100  NA 137  K 6.2*  CL 104  CO2 16*  GLUCOSE 103*  BUN 37*  CREATININE 2.23*  CALCIUM 8.6*   Liver Function Tests: Recent Labs  Lab 12/25/2021 1100  AST 44*  ALT 24  ALKPHOS 122  BILITOT 1.5*  PROT 6.5  ALBUMIN 3.2*   No results for input(s): LIPASE, AMYLASE in the last 168 hours. No results for input(s): AMMONIA in the last 168 hours. CBC: Recent Labs  Lab 12/16/2021 1100  WBC 21.9*  NEUTROABS 18.8*  HGB 12.6  HCT 41.7  MCV 89.7  PLT 211   Coagulation Profile: No results for input(s): INR, PROTIME in the last 168 hours. Cardiac Enzymes: No results for input(s): CKTOTAL, CKMB, CKMBINDEX, TROPONINI in the last 168 hours. BNP: Invalid input(s): POCBNP CBG: No results for input(s): GLUCAP in the last 168 hours. Urine  analysis:    Component Value Date/Time   COLORURINE YELLOW 04/30/2019 1842   APPEARANCEUR HAZY (A) 04/30/2019 1842   LABSPEC 1.018 04/30/2019 1842   PHURINE 5.0 04/30/2019 1842   GLUCOSEU NEGATIVE 04/30/2019 1842   HGBUR NEGATIVE 04/30/2019 1842   BILIRUBINUR NEGATIVE 04/30/2019 1842   KETONESUR NEGATIVE 04/30/2019 1842   PROTEINUR NEGATIVE 04/30/2019 1842   UROBILINOGEN 1.0 10/13/2015 1351  NITRITE NEGATIVE 04/30/2019 1842   LEUKOCYTESUR MODERATE (A) 04/30/2019 1842   Sepsis Labs: @LABRCNTIP (procalcitonin:4,lacticidven:4) )No results found for this or any previous visit (from the past 240 hour(s)).   Radiological Exams on Admission: DG Chest Port 1 View  Result Date: 12/18/2021 CLINICAL DATA:  Shortness of breath EXAM: PORTABLE CHEST 1 VIEW COMPARISON:  07/18/2017 FINDINGS: Transverse diameter of heart is increased. There are no signs of pulmonary edema or focal pulmonary consolidation. Lateral CP angles are indistinct. There is no pneumothorax. Severe degenerative changes are noted in both shoulders. Pacemaker battery is seen in the left infraclavicular region. IMPRESSION: Cardiomegaly. There are no signs of pulmonary edema or focal pulmonary consolidation. Blunting of lateral CP angles may suggest pleural thickening or minimal effusions. Electronically Signed   By: Elmer Picker M.D.   On: 12/12/2021 12:31   DG Foot Complete Left  Result Date: 12/25/2021 CLINICAL DATA:  Weakness, shortness of breath, left foot pain EXAM: LEFT FOOT - COMPLETE 3+ VIEW COMPARISON:  None. FINDINGS: Diffuse osteopenia. There is diffuse soft tissue swelling. There is no evidence of acute fracture. There is no frank bony destruction. 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 Mild-to-moderate forefoot osteoarthritis. Mild hindfoot osteoarthritis. IMPRESSION: Diffuse soft tissue swelling of the foot. No frank bony destruction to suggest osteomyelitis. MRI would be more sensitive. Electronically Signed    By: Maurine Simmering M.D.   On: 12/29/2021 12:31   DG Foot Complete Left  Result Date: 12/18/2021 Please see detailed radiograph report in office note.   EKG: Independently reviewed. V-paced, RBBB    Time spent:80 minutes Code Status:   FULL Family Communication:  son and daughter at bedside 1/11 Disposition Plan: expect 2-3 day hospitalization Consults called: vascular surgery--Dr. Carlis Abbott  DVT Prophylaxis: [place] Lovenox  Orson Eva, DO  [facility] Pager [phone]  If 7PM-7AM, please contact night-coverage [email or web address] Password [facility] 12/24/2021, 1:49 PM

## 2021-12-20 NOTE — Progress Notes (Addendum)
CRITICAL NOTE  [facility] was in the ED to transfer patient to [facility], BP was noted to be low at 69/39 with a MAP of 46.  Patient was started on IV hydration of NS, apparently patient has had up to 4 L of IV hydration prior to now.at bedside, she was started on IV peripheral Levophed with improvement in BP to 111/95 with a MAP of 101, she was also noted to be hypoxic with O2 sat at 84%, ABG was ordered, unfortunately, after several attempts by respiratory therapist, it was not feasible to obtain blood for the ABG.  Nasal cannula was changed to NRB at 13 L with improved oxygenation.  Patient continued to be restless and trying to remove the NRB and IV lines.  Continue to use accessory muscles, she was awake, but was not coherent and unable to identify son at bedside. Patient was initially full code, but after discussing with son at bedside, patient's CODE STATUS was changed to a DNR. Patient's chart was reviewed and she was noted to be positive for COVID-19 virus infection.  Chest x-ray was repeated and showed minimal bibasilar atelectasis/airspace disease.  Patient is currently unable to tolerate oral p.o. meds at this time due to current condition.  Pharmacist was consulted regarding treatment for COVID-19 virus in this patient and IV remdesivir with steroid was recommended, this was discussed with family (2 sons and a daughter at bedside) and they wanted COVID-19 virus infection treatment to be initiated. Patient continued to be in some respiratory distress, lactic acid continued to be highly elevated.  Discussion was held with family regarding comfort care and they wanted to wait for another sister in [place] to be at bedside prior to deciding on comfort care.  In the meantime, they want current treatment to continue with the hope that this would sustain patient's life prior to arrival of the sister.  Family understands that patient's prognosis is poor   Physical Exam BP (!) 85/59    Pulse (!) 56    Temp  (!) 97.5 F (36.4 C) (Oral)    Resp (!) 22    Ht _0  (1.651 m)    Wt 63 kg    SpO2 94%    BMI 23.13 kg/m    Gen:- Awake Alert, ill appearing and in acute distress HEENT:- [place].AT, No sclera icterus Neck-Supple Neck,No JVD.  Lungs-tachypnea.  Diffuse coarse breath sounds, use of accessory muscles.  No wheezing CV-intermittent tachycardia.  S1, S2 normal Abd-  +ve B.Sounds, Abd Soft, No tenderness,    Extremity/Skin:- +3 edema, open wound on the left side of left foot.  Cyanotic and edematous left great toe both in the dorsal and plantar area of great toe with some surrounding cyanosis of the plantar area of foot.  Edema of other toes of the left foot is noted. Psych-this cannot be assessed at this time due to patient's current condition Neuro-unable to recognize daughter and sons at bedside.  Moving all extremities, but not following commands   Assessment and plan Acute respiratory failure with hypoxia in the setting of SIRS with suspicion for severe sepsis Continue treatment as described in H & P      Septic shock Patient's BP was low and required vasopressor to maintain MAP > 65 Lactic acid was > 9.0 despite IV hydration Continue IV hydration and vasopressors Continue antibiotics as described in H and P  COVID-19 virus infection Patient was unable to tolerate oral intake at this time, pharmacy was consulted and agreed  to IV remdesivir and Decadron Family agreed to treatment. Continue albuterol q.6h Continue IV Decadron 6 mg daily Continue IV Remdesivir per pharmacy protocol Continue supplemental oxygen to maintain O2 sat > or = 94% with plan to wean patient off supplemental oxygen as tolerated (of note, patient does not use oxygen at baseline) Continue incentive spirometry and flutter valve q37mn as tolerated Encourage proning, early ambulation, and side laying as tolerated Continue airborne isolation precaution Continue monitoring daily inflammatory markers  Acute kidney  injury on CKD stage IIIA BUN/creatinine at 37/2.23 (baseline creatinine at 0.9-1.0) eGFR currently at 19 IV hydration provided with minimal urine output Renally adjust medications, avoid nephrotoxic agents/dehydration/hypotension Consider nephrology consult  Left ischemic [facility]  Patient's prognosis was poor.  This was discussed with family at bedside.  Family wants current treatment continued [place]  Critical time: 71 minutes   Critical care personally provided  managing the patient due to high probability of clinically significant and life threatening deterioration. This critical care time included obtaining a history; examining the patient, pulse oximetry; ordering and review of studies; arranging urgent treatment with development of a management plan; evaluation of patient's response of treatment; frequent reassessment; and discussions with other providers.  This critical care time was performed to assess and manage the high probability of imminent and life threatening deterioration that could result in multi-organ failure.

## 2021-12-20 NOTE — Progress Notes (Signed)
ABG attempted time two with no success bp 72/28 nurse and md aware

## 2021-12-20 NOTE — ED Notes (Signed)
Patient is currently sleeping, daughter at bedside

## 2021-12-20 NOTE — ED Notes (Signed)
Pt was placed on purwick @1130 

## 2021-12-20 NOTE — ED Triage Notes (Signed)
Pt brought in from [facility] for c/o left foot pain. Per EMS, pt had callus removed from left foot on Monday and foot has since become red and swollen. Pt vomited x1 during triage.

## 2021-12-20 NOTE — Progress Notes (Signed)
Pt's son states podiatrist cultured wound to left foot on Monday and was given antibiotics and was instructed to come to ED for worsening symptoms.

## 2021-12-20 NOTE — Sepsis Progress Note (Signed)
Sepsis protocol monitored by [facility] 

## 2021-12-20 NOTE — Sepsis Progress Note (Addendum)
Notified provider of need to order additional lactic acid.

## 2021-12-20 NOTE — Progress Notes (Signed)
LEFT FOOT

## 2021-12-20 NOTE — Progress Notes (Signed)
Location:  [place] Room Number: 108 Place of Service:  SNF (31)   CODE STATUS: dnr   Allergies  Allergen Reactions   Elemental Sulfur Swelling and Rash    Chief Complaint  Patient presents with   Acute Visit    Left great toe ulceration     HPI:  This AM she was noted to have increased difficulty with speech. She would not move her left arm unless told to do so. She tells me that she feels "bad" and "weak" she does have pain in her legs.   She has an ulceration on her left great toe. She is presently on doxycyline for her toe. Her foot is red and inflamed. Her left great toe is cyanotic and cold to touch. She does not complain of pain to her foot. There have been no reports of fevers present.   Past Medical History:  Diagnosis Date   Atrial fibrillation (HCC)    Bradycardia    Breast nodule 11/15/2014   Breast pain, left 11/15/2014   Carotid artery disease (HCC)    Dizziness    Dyspnea    Previous CPX suggesting possible restrictive physiology, respiratory muscle fatigue, diastolic dysfunction   Essential hypertension, benign    Hyperlipidemia    Oxygen dependent    2 liter   Pacemaker [employer]    Rib pain on left side 11/15/2014   Seasonal allergies    Shingles 05/04/2015   Stroke ([place])    Toe fracture, left    left big toe    Past Surgical History:  Procedure Laterality Date   COLONOSCOPY  02/22/2012   Procedure: COLONOSCOPY;  Surgeon: Rogene Houston, MD;  Location: [facility];  Service: Endoscopy;  Laterality: N/A;  100   KNEE SURGERY     bilateral   PACEMAKER IMPLANT N/A 07/17/2017   Procedure: Pacemaker Implant;  Surgeon: Deboraha Sprang, MD;  Location: [place] CV LAB;  Service: Cardiovascular;  Laterality: N/A;    Social History   Socioeconomic History   Marital status: Widowed    Spouse name: Not on file   Number of children: Not on file   Years of education: Not on file   Highest education level: Not on file   Occupational History   Not on file  Tobacco Use   Smoking status: Never   Smokeless tobacco: Never  Vaping Use   Vaping Use: Never used  Substance and Sexual Activity   Alcohol use: No   Drug use: Yes    Types: Amyl nitrate   Sexual activity: Never    Birth control/protection: Post-menopausal  Other Topics Concern   Not on file  Social History Narrative   Not on file   Social Determinants of Health   Financial Resource Strain: Not on file  Food Insecurity: Not on file  Transportation Needs: Not on file  Physical Activity: Not on file  Stress: Not on file  Social Connections: Not on file  Intimate Partner Violence: Not on file   Family History  Problem Relation Age of Onset   Stroke Mother    Pneumonia Father    Heart disease Father    Healthy Son    Glaucoma Daughter    Emphysema Daughter    Healthy Daughter    Other Daughter        had knee replacement   Healthy Son    Heart disease Maternal Grandmother       VITAL SIGNS BP  139/78    Pulse 80    Temp 97.6 F (36.4 C)    Ht 5\' 4"  (1.626 m)    Wt 139 lb (63 kg)    BMI 23.86 kg/m   Outpatient Encounter Medications as of 12/28/2021  Medication Sig   acetaminophen (TYLENOL) 650 MG CR tablet Take 650 mg by mouth every 12 (twelve) hours. 9 am and 9 pm   albuterol (VENTOLIN HFA) 108 (90 Base) MCG/ACT inhaler Inhale 2 puffs into the lungs at bedtime. Wait at least 5 minutes between use of multiple inhalers. [DX: Chronic obstructive pulmonary disease, unspecified]   alum & mag hydroxide-simeth (MAALOX PLUS) 400-400-40 MG/5ML suspension Take 30 mLs by mouth every 8 (eight) hours as needed for indigestion.   ANORO ELLIPTA 62.5-25 MCG/INH AEPB Inhale 1 puff into the lungs daily.    apixaban (ELIQUIS) 2.5 MG TABS tablet Take 1 tablet (2.5 mg total) by mouth 2 (two) times daily. Resume 07/20/17   artificial tears (LACRILUBE) OINT ophthalmic ointment Place 1 application into both eyes at bedtime.   Carboxymethylcellulose  Sodium (ARTIFICIAL TEARS OP) Apply to eye. Artificial Tears Opthalmic GEL Apply to both eyes at night. Wait 5 minutes between multiple applications in same eye.  Apply to both eyes daily as needed for irritation/dryness. Wait at least 5 minutes between multiple opth administrations in same eye.   colchicine 0.6 MG tablet    cycloSPORINE (RESTASIS) 0.05 % ophthalmic emulsion Place 1 drop into both eyes 2 (two) times daily.   diclofenac Sodium (VOLTAREN) 1 % GEL Apply 2 g topically 3 (three) times daily. Special Instructions: apply to left shoulder bilateral knees left ankle for pain management   diltiazem (CARDIZEM CD) 180 MG 24 hr capsule TAKE ONE CAPSULE BY MOUTH ONCE DAILY.   doxycycline (VIBRA-TABS) 100 MG tablet Take 1 tablet (100 mg total) by mouth 2 (two) times daily.   furosemide (LASIX) 40 MG tablet Take 40 mg by mouth 2 (two) times daily. Chronic diastolic (congestive) heart failure   gabapentin (NEURONTIN) 100 MG capsule Take 200 mg by mouth 2 (two) times daily. Bilateral Lower Extremity Neuropathic Pain   Melatonin 5 MG TABS Take 5 mg by mouth at bedtime as needed.    NON FORMULARY Diet Type:  Regular   Nutritional Supplements (ENSURE ENLIVE PO) Take 237 mLs by mouth daily.   OXYGEN Inhale 2 L into the lungs at bedtime.    pantoprazole (PROTONIX) 40 MG tablet Take 40 mg by mouth 2 (two) times daily.   Polyethyl Glycol-Propyl Glycol (SYSTANE OP) Place 1 drop into both eyes every 4 (four) hours. For dry eyes  Administer 15 minutes after administering Restasis eye drops.   polyethylene glycol (MIRALAX / GLYCOLAX) packet Take 17 g by mouth daily as needed. For constipation   potassium chloride SA (KLOR-CON) 20 MEQ tablet Take 20 mEq by mouth 2 (two) times daily.   traMADol (ULTRAM) 50 MG tablet Take 0.5 tablets (25 mg total) by mouth every 8 (eight) hours.   No facility-administered encounter medications on file as of 01/08/2022.     SIGNIFICANT DIAGNOSTIC  EXAMS   PREVIOUS  06-15-21: chest x-ray:  Enlargement  is noted possible with some degree of vascular engorgement Oblate appearance could represent a large pericardial effusion.   NO NEW EXAMS.       LABS REVIEWED: PREVIOUS    05-18-21: wbc 4.5; hgb 12.3; hct 39.6; mcv 86.3 plt 169; glucose 83; bun 21; creat 0.88; k+ 3.7; na++ 138; ca 8.5 GFR  59; liver normal albumin 3.3 06-15-21: wbc 3.7; hgb 13.4 hct 43.3; mcv 86.3 plt 164; glucose 98; bun 16; creat 1.02; k+ 3.6; na++ 140; ca 8.4 GFR 49; d-dimer 0.75; CPR 1.0 06-22-21: glucose 72; bun 15; creat 0.88; k+ 3.3; na++ 140; ca 8.1; GFR 59 06-26-21: k+ 3.6    11-27-21: chol 149; ldl 78 trig 44 hdl 62 12-11-21: wbc 5.6; hgb 12.3; hct 39.3; mcv 88.3 plt 169; glucose 69; bun 31; creat 1.24; k+ 4.5; na++ 139; ca 8.2; GFR 39; liver normal albumin 3.2   NO NEW LABS.    Review of Systems  Constitutional:  Positive for malaise/fatigue.  Respiratory:  Negative for cough and shortness of breath.   Cardiovascular:  Negative for chest pain, palpitations and leg swelling.  Gastrointestinal:  Negative for abdominal pain, constipation and heartburn.  Musculoskeletal:  Negative for back pain, joint pain and myalgias.  Skin: Negative.   Neurological:  Positive for weakness. Negative for dizziness.  Psychiatric/Behavioral:  The patient is not nervous/anxious.    Physical Exam Constitutional:      General: She is not in acute distress.    Appearance: She is well-developed. She is not diaphoretic.  Neck:     Thyroid: No thyromegaly.  Cardiovascular:     Rate and Rhythm: Normal rate and regular rhythm.     Heart sounds: Normal heart sounds.     Comments: Pedal pulses faint  Pulmonary:     Effort: Pulmonary effort is normal. No respiratory distress.     Breath sounds: Normal breath sounds.  Abdominal:     General: Bowel sounds are normal. There is no distension.     Palpations: Abdomen is soft.     Tenderness: There is no abdominal tenderness.   Musculoskeletal:     Cervical back: Neck supple.     Right lower leg: Edema present.     Left lower leg: Edema present.     Comments:  3+ bilateral lower extremity edema   Lymphadenopathy:     Cervical: No cervical adenopathy.  Skin:    General: Skin is warm and dry.     Comments: 2 cm left great toe ulceration  Left great toe is cyanotic posterior great toe is bruised; is cold to touch.    Neurological:     Mental Status: She is alert and oriented to person, place, and time.  Psychiatric:        Mood and Affect: Mood normal.     ASSESSMENT/ PLAN:  TODAY  Cerebrovascular accident (CVA) unspecified mechanism Skin ulceration of left great toe unspecified stage Toe cyanosis  Due to the significant change in her left foot and probable cva vs tia will send her to the ED for further workup.    Ok Edwards NP [facility]  call [phone]

## 2021-12-20 NOTE — Sepsis Progress Note (Signed)
Sent secure chat to bedside RN Colletta [place] to draw second [place]

## 2021-12-20 NOTE — ED Provider Notes (Signed)
[facility] EMERGENCY DEPARTMENT Provider Note   CSN: [id number] Arrival date & time: 12/23/2021  1037     History  Chief Complaint  Patient presents with   Foot Pain    left    Wendy Perkins is a 88 y.o. female.  Patient brought in for pain and swelling to left big toe.  She has been treated with antibiotics without improvement.  Patient has a history of stroke and hypertension  The history is provided by a relative. No language interpreter was used.  Foot Pain This is a new problem. The current episode started 12 to 24 hours ago. The problem occurs constantly. The problem has not changed since onset.Pertinent negatives include no chest pain. Nothing aggravates the symptoms. She has tried nothing for the symptoms. The treatment provided no relief.      Home Medications Prior to Admission medications   Medication Sig Start Date End Date Taking? Authorizing Provider  acetaminophen (TYLENOL) 650 MG CR tablet Take 650 mg by mouth 2 (two) times daily.   Yes [provider]  albuterol (VENTOLIN HFA) 108 (90 Base) MCG/ACT inhaler Inhale 2 puffs into the lungs at bedtime. Wait at least 5 minutes between use of multiple inhalers. [DX: Chronic obstructive pulmonary disease, unspecified]   Yes [provider]  alum & mag hydroxide-simeth (MAALOX PLUS) 400-400-40 MG/5ML suspension Take 30 mLs by mouth every 8 (eight) hours as needed for indigestion. 12/06/20  Yes [provider]  ANORO ELLIPTA 62.5-25 MCG/INH AEPB Inhale 1 puff into the lungs daily.  10/12/14  Yes [provider]  apixaban (ELIQUIS) 2.5 MG TABS tablet Take 1 tablet (2.5 mg total) by mouth 2 (two) times daily. Resume 07/20/17 07/19/17  Yes Seiler, Luetta Nutting K, NP  artificial tears (LACRILUBE) OINT ophthalmic ointment Place 1 application into both eyes at bedtime.   Yes [provider]  Carboxymethylcellulose Sodium (ARTIFICIAL TEARS OP) Apply to eye. Artificial Tears Opthalmic GEL Apply  to both eyes at night. Wait 5 minutes between multiple applications in same eye.  Apply to both eyes daily as needed for irritation/dryness. Wait at least 5 minutes between multiple opth administrations in same eye.   Yes [provider]  cycloSPORINE (RESTASIS) 0.05 % ophthalmic emulsion Place 1 drop into both eyes 2 (two) times daily.   Yes [provider]  diclofenac Sodium (VOLTAREN) 1 % GEL Apply 2 g topically 3 (three) times daily. Special Instructions: apply to left shoulder bilateral knees left ankle for pain management   Yes [provider]  diltiazem (CARDIZEM CD) 180 MG 24 hr capsule TAKE ONE CAPSULE BY MOUTH ONCE DAILY. Patient taking differently: 180 mg daily. 05/10/15  Yes Satira Sark, MD  doxycycline (VIBRA-TABS) 100 MG tablet Take 1 tablet (100 mg total) by mouth 2 (two) times daily. 12/18/21  Yes Edrick Kins, DPM  furosemide (LASIX) 40 MG tablet Take 40 mg by mouth 2 (two) times daily. Chronic diastolic (congestive) heart failure   Yes [provider]  gabapentin (NEURONTIN) 100 MG capsule Take 200 mg by mouth 2 (two) times daily. Bilateral Lower Extremity Neuropathic Pain 12/19/20  Yes [provider]  Melatonin 5 MG TABS Take 5 mg by mouth at bedtime as needed.    Yes [provider]  Nutritional Supplements (ENSURE ENLIVE PO) Take 237 mLs by mouth daily as needed. 07/26/19  Yes [provider]  pantoprazole (PROTONIX) 40 MG tablet Take 40 mg by mouth 2 (two) times daily. 10/07/20  Yes  [provider]  polyethylene glycol (MIRALAX / GLYCOLAX) packet Take 17 g by mouth daily as needed. For constipation 01/01/18  Yes [provider]  potassium chloride SA (KLOR-CON) 20 MEQ tablet Take 20 mEq by mouth 2 (two) times daily.   Yes [provider]  traMADol (ULTRAM) 50 MG tablet Take 0.5 tablets (25 mg total) by mouth every 8 (eight) hours. Patient taking differently: Take 25 mg by mouth 2 (two)  times daily. 12/12/21   Gerlene Fee, NP      Allergies    Elemental sulfur    Review of Systems   Review of Systems  Unable to perform ROS: Dementia  Cardiovascular:  Negative for chest pain.   Physical Exam Updated Vital Signs BP 107/65    Pulse 60    Temp (!) 97.5 F (36.4 C) (Oral)    Resp (!) 26    Ht 5\' 5"  (1.651 m)    Wt 63 kg    SpO2 96%    BMI 23.13 kg/m  Physical Exam Vitals reviewed.  Constitutional:      Appearance: She is well-developed.  HENT:     Head: Normocephalic.  Eyes:     General: No scleral icterus.    Conjunctiva/sclera: Conjunctivae normal.  Neck:     Thyroid: No thyromegaly.  Cardiovascular:     Rate and Rhythm: Normal rate and regular rhythm.     Heart sounds: No murmur heard.   No friction rub. No gallop.  Pulmonary:     Breath sounds: No stridor. No wheezing or rales.  Chest:     Chest wall: No tenderness.  Abdominal:     General: There is no distension.     Tenderness: There is no abdominal tenderness. There is no rebound.  Musculoskeletal:        General: Normal range of motion.     Cervical back: Neck supple.     Comments: Infected right foot  Lymphadenopathy:     Cervical: No cervical adenopathy.  Skin:    Findings: No erythema or rash.  Neurological:     Mental Status: She is alert.     Motor: No abnormal muscle tone.     Coordination: Coordination normal.  Psychiatric:        Behavior: Behavior normal.    ED Results / Procedures / Treatments   Labs (all labs ordered are listed, but only abnormal results are displayed) Labs Reviewed  CBC WITH DIFFERENTIAL/PLATELET - Abnormal; Notable for the following components:      Result Value   WBC 21.9 (*)    Neutro Abs 18.8 (*)    Monocytes Absolute 1.8 (*)    Abs Immature Granulocytes 0.39 (*)    All other components within normal limits  COMPREHENSIVE METABOLIC PANEL - Abnormal; Notable for the following components:   Potassium 6.2 (*)    CO2 16 (*)    Glucose, Bld 103 (*)     BUN 37 (*)    Creatinine, Ser 2.23 (*)    Calcium 8.6 (*)    Albumin 3.2 (*)    AST 44 (*)    Total Bilirubin 1.5 (*)    GFR, Estimated 19 (*)    Anion gap 17 (*)    All other components within normal limits  LACTIC ACID, PLASMA - Abnormal; Notable for the following components:   Lactic Acid, Venous 8.7 (*)    All other components within normal limits  BRAIN NATRIURETIC PEPTIDE - Abnormal; Notable for the  following components:   B Natriuretic Peptide 740.0 (*)    All other components within normal limits  CULTURE, BLOOD (SINGLE)    EKG EKG Interpretation  Date/Time:  Wednesday December 20 2021 12:28:18 EST Ventricular Rate:  63 PR Interval:    QRS Duration: 159 QT Interval:  490 QTC Calculation: 502 R Axis:   -84 Text Interpretation: Junctional rhythm IVCD, consider atypical RBBB Left ventricular hypertrophy Anterolateral infarct, old Confirmed by Milton Ferguson [phone]) on 12/19/2021 12:50:53 PM  Radiology DG Chest Port 1 View  Result Date: 01/08/2022 CLINICAL DATA:  Shortness of breath EXAM: PORTABLE CHEST 1 VIEW COMPARISON:  07/18/2017 FINDINGS: Transverse diameter of heart is increased. There are no signs of pulmonary edema or focal pulmonary consolidation. Lateral CP angles are indistinct. There is no pneumothorax. Severe degenerative changes are noted in both shoulders. Pacemaker battery is seen in the left infraclavicular region. IMPRESSION: Cardiomegaly. There are no signs of pulmonary edema or focal pulmonary consolidation. Blunting of lateral CP angles may suggest pleural thickening or minimal effusions. Electronically Signed   By: Elmer Picker M.D.   On: 12/25/2021 12:31   DG Foot Complete Left  Result Date: 01/03/2022 CLINICAL DATA:  Weakness, shortness of breath, left foot pain EXAM: LEFT FOOT - COMPLETE 3+ VIEW COMPARISON:  None. FINDINGS: Diffuse osteopenia. There is diffuse soft tissue swelling. There is no evidence of acute fracture. There is no frank  bony destruction. 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 Mild-to-moderate forefoot osteoarthritis. Mild hindfoot osteoarthritis. IMPRESSION: Diffuse soft tissue swelling of the foot. No frank bony destruction to suggest osteomyelitis. MRI would be more sensitive. Electronically Signed   By: Maurine Simmering M.D.   On: 12/14/2021 12:31   DG Foot Complete Left  Result Date: 12/18/2021 Please see detailed radiograph report in office note.   Procedures Procedures    Medications Ordered in ED Medications  vancomycin (VANCOCIN) IVPB 1000 mg/200 mL premix (1,000 mg Intravenous New Bag/Given 12/29/2021 1232)  sodium chloride 0.9 % bolus 1,000 mL (has no administration in time range)  lactated ringers infusion (has no administration in time range)  sodium chloride 0.9 % bolus 1,000 mL (has no administration in time range)  insulin aspart (novoLOG) injection 10 Units (has no administration in time range)    And  dextrose 50 % solution 50 mL (has no administration in time range)  sodium bicarbonate injection 50 mEq (has no administration in time range)   CRITICAL CARE Performed by: Milton Ferguson Total critical care time: 40 minutes Critical care time was exclusive of separately billable procedures and treating other patients. Critical care was necessary to treat or prevent imminent or life-threatening deterioration. Critical care was time spent personally by me on the following activities: development of treatment plan with patient and/or surrogate as well as nursing, discussions with consultants, evaluation of patient's response to treatment, examination of patient, obtaining history from patient or surrogate, ordering and performing treatments and interventions, ordering and review of laboratory studies, ordering and review of radiographic studies, pulse oximetry and re-evaluation of patient's condition.  ED Course/ Medical Decision Making/ A&P                           Medical Decision  Making  Patient with sepsis secondary to infected foot.  She is treated with antibiotics and fluids and will be admitted to medicine.  Also patient has hyperkalemia and has been treated for that    This patient presents to the ED  for concern of infected toe, this involves an extensive number of treatment options, and is a complaint that carries with it a high risk of complications and morbidity.  The differential diagnosis includes cellulitis from the foot, sepsis,   Co morbidities that complicate the patient evaluation  Stroke, hypertension   Additional history obtained:  Additional history obtained from relative External records from outside source obtained and reviewed including old records  Lab Tests:  I Ordered, and personally interpreted labs.  The pertinent results include: Lactic acid greater than 9, positive COVID   Imaging Studies ordered:  I ordered imaging studies including x-ray of chest shows cardiomegaly and foot shows possible osteomyelitis I independently visualized and interpreted imaging which showed cardiomegaly and osteomyelitis I agree with the radiologist interpretation   Cardiac Monitoring:  The patient was maintained on a cardiac monitor.  I personally viewed and interpreted the cardiac monitored which showed an underlying rhythm of: Normal sinus   Medicines ordered and prescription drug management:  I ordered medication including antibiotics for sepsis Reevaluation of the patient after these medicines showed that the patient stayed the same I have reviewed the patients home medicines and have made adjustments as needed   Test Considered:  MRI   Critical Interventions:  Sepsis protocol   Consultations Obtained:  I requested consultation with the hospitalist,  and discussed lab and imaging findings as well as pertinent plan - they recommend: Admission   Problem List / ED Course:  Osteomyelitis right foot with  sepsis   Reevaluation:  After the interventions noted above, I reevaluated the patient and found that they have :stayed the same   Social Determinants of Health:  Dementia   Dispostion:  After consideration of the diagnostic results and the patients response to treatment, I feel that the patent would benefit from admission.         Final Clinical Impression(s) / ED Diagnoses Final diagnoses:  Pain    Rx / DC Orders ED Discharge Orders     None         Milton Ferguson, MD 12/23/21 1113

## 2021-12-21 DIAGNOSIS — A4150 Gram-negative sepsis, unspecified: Secondary | ICD-10-CM

## 2021-12-21 LAB — BLOOD CULTURE ID PANEL (REFLEXED) - BCID2

## 2021-12-21 LAB — URINE CULTURE: Culture: NO GROWTH

## 2021-12-21 LAB — LACTIC ACID, PLASMA: Lactic Acid, Venous: >9 mmol/L (ref 0.5–1.9)

## 2021-12-21 LAB — MRSA NEXT GEN BY PCR, NASAL: MRSA by PCR Next Gen: NOT DETECTED

## 2021-12-21 MED ORDER — SODIUM CHLORIDE 0.9 % IV SOLN
100.0000 mg | Freq: Once | INTRAVENOUS | Status: DC
  Filled 2021-12-21: qty 20

## 2021-12-21 MED ORDER — SODIUM CHLORIDE 0.9 % IV SOLN: 100.0000 mg | Freq: Every day | INTRAVENOUS | Status: DC

## 2021-12-21 MED ORDER — PANTOPRAZOLE SODIUM 40 MG IV SOLR: 40.0000 mg | Freq: Two times a day (BID) | INTRAVENOUS | Status: DC

## 2021-12-21 MED ORDER — IPRATROPIUM BROMIDE HFA 17 MCG/ACT IN AERS
2.0000 | INHALATION_SPRAY | Freq: Four times a day (QID) | RESPIRATORY_TRACT | Status: DC
  Filled 2021-12-21: qty 12.9

## 2021-12-21 MED ORDER — SODIUM CHLORIDE 0.9 % IV SOLN: 200.0000 mg | Freq: Once | INTRAVENOUS | Status: DC

## 2021-12-21 MED ORDER — SODIUM CHLORIDE 0.9 % IV SOLN: INTRAVENOUS | Status: DC

## 2021-12-21 MED ORDER — REMDESIVIR 100 MG IV SOLR
100.0000 mg | Freq: Once | INTRAVENOUS | Status: DC
  Filled 2021-12-21: qty 20

## 2021-12-21 MED ORDER — DEXAMETHASONE SODIUM PHOSPHATE 10 MG/ML IJ SOLN: 6.0000 mg | INTRAMUSCULAR | Status: DC

## 2021-12-21 MED ORDER — INSULIN ASPART 100 UNIT/ML IJ SOLN: 0.0000 [IU] | INTRAMUSCULAR | Status: DC

## 2021-12-21 MED ORDER — ENOXAPARIN SODIUM 30 MG/0.3ML IJ SOSY: 30.0000 mg | PREFILLED_SYRINGE | INTRAMUSCULAR | Status: DC

## 2021-12-23 LAB — CULTURE, BLOOD (SINGLE)

## 2021-12-24 NOTE — Progress Notes (Signed)
HPI: 88 y.o. female presenting today with her son Jenny Reichmann for evaluation of left foot and ankle pain.  The patient is a very poor historian.  She says that the pain is in her knee however the patient states that they are here for the foot.  She wears compression hose daily.  Upon removal of the compression hose there is a wound to the left forefoot around the great toe joint.  The patient and the son are unaware that this was there and they are unsure of the duration of the wound.  Currently there has not been anything for treatment.  They present for further treatment and evaluation  Past Medical History:  Diagnosis Date   Atrial fibrillation ([place])    Bradycardia    Breast nodule 11/15/2014   Breast pain, left 11/15/2014   Carotid artery disease (HCC)    Dizziness    Dyspnea    Previous CPX suggesting possible restrictive physiology, respiratory muscle fatigue, diastolic dysfunction   Essential hypertension, benign    Hyperlipidemia    Oxygen dependent    2 liter   Pacemaker [employer]    Rib pain on left side 11/15/2014   Seasonal allergies    Shingles 05/04/2015   Stroke ([place])    Toe fracture, left    left big toe    Past Surgical History:  Procedure Laterality Date   COLONOSCOPY  02/22/2012   Procedure: COLONOSCOPY;  Surgeon: Rogene Houston, MD;  Location[facility];  Service: Endoscopy;  Laterality: N/A;  100   KNEE SURGERY     bilateral   PACEMAKER IMPLANT N/A 07/17/2017   Procedure: Pacemaker Implant;  Surgeon: Deboraha Sprang, MD;  Location: [place] [facility] LAB;  Service: Cardiovascular;  Laterality: N/A;    Allergies  Allergen Reactions   Elemental Sulfur Swelling and Rash         Physical Exam: General: The patient is alert and oriented x3 in no acute distress.  Dermatology: Ulcer noted to the medial aspect of the first MTP joint left foot measuring approximately 2.0 x 2.5 x 0.2 cm.  There is eschar noted to the wound base with intermittent fibrotic tissue.   Periwound erythema and edema noted.  Vascular: Edema noted left foot.  Skin is warm to touch.  Neurological: Light touch and protective threshold grossly intact  Musculoskeletal Exam: No pedal deformities noted.  Patient is mostly nonambulatory  Radiographic Exam:  Diffuse soft tissue swelling.  No evidence of osseous erosion or concern for osteomyelitis at the moment.  Diffuse osteopenia.  Assessment: 1.  Ulcer first MTP left 2.  Cellulitis left forefoot   Plan of Care:  1. Patient evaluated. X-Rays reviewed.  2.  Medically necessary excisional debridement including some of the eschar and fibrotic tissue was performed using a tissue nipper as much of the patient can tolerate.  Excisional debridement of the necrotic nonviable tissue down to healthier tissue was performed postdebridement measurement same as pre- 3.  Cultures were taken of the wound sent to pathology for culture and sensitivity 4.  Prescription for doxycycline 100 mg 2 times daily #20 5.  Recommend Betadine wet-to-dry dressings daily 6.  Message was sent to Ok Edwards NP, at [facility] informing her of the ulcer to the medial aspect of first MTP joint and recommended daily dressing changes.  Apparently according to the son today the [facility] care is unaware of the wound to the foot.  Recommend that if the cellulitis or  wound becomes increasingly painful or she has increased swelling and tenderness to the foot she should go to the emergency department 7.  Return to clinic in 2 weeks      Edrick Kins, DPM [facility]  Dr. Edrick Kins, DPM    [address], [place] [address]                Office [phone]  Fax [phone]

## 2022-01-10 NOTE — Death Summary Note (Signed)
DEATH SUMMARY   Patient Details  Name: Wendy Perkins MRN: [id number] DOB: [birth year]  Admission/Discharge Information   Admit Date:  01-05-22  Date of Death: Date of Death: Jan 06, 2022  Time of Death: Time of Death: 0105  Length of Stay: 1  Referring Physician: Gerlene Fee, NP   Reason(s) for Hospitalization  Left foot pain  Diagnoses  Preliminary cause of death: sepsis with septic shock Secondary Diagnoses (including complications and co-morbidities):  Principal Problem:   Sepsis due to undetermined organism [facility]) Active Problems:   Essential hypertension   Atrial fibrillation ([place])   Complete heart block ([place])   Critical ischemia of foot ([place])   Acute renal failure superimposed on stage 3a chronic kidney disease ([place]) Sepsis with septic shock -due to gram negative bacteremia -initially started on vanc and cefepime empirically prior to blood culture results -lactic >9.0 -initial source may be urine -UA >50 WBC -CXR neg for edema or consolidation -given IVF boluses and started on IVF drip -intially hemodynamically stable -later in evening 2022-01-05, patient developed hypotension despite IV abx and IV resuscitation -she was started on levophed and transfer to [facility] was postponed -attempts were made for ABG and repeat labs but were unsuccessful  Gram Negative bacteremia -pt was initially started on cefepime empirically -source likely urine vs foot  Pyuria -UA >50 WBC -initially on cefepime  Left foot ischemia -see pics on my earlier progress note -I spoke with vascular surgery, Dr. Arthor Captain consult once patient arrives to [place] 19 infection -this was pending at the time of initial evaluation -pt was stable on 2L at time of initial evaluation and remained stable without any respiratory distress -according to medical record, patient is chronically on 2L [place] -result of test came back at 2004/02/21 on 05-Jan-2022 at which time patient was started on IV  remdesivir and IV steroids   Acute on chronic renal failure--CKD 3a -Baseline creatinine 0.8-1.0 -Presents with serum creatinine 2.23 -Secondary to sepsis and hypotension   Hyperkalemia -Temporizing measures given -given dose Lokelma   Acute metabolic encephalopathy -Secondary to infectious process -Further work-up if no improvement with antibiotics   Lactic Acidosis -given over 4L boluses and started on IVF and IV abx -due to sepsis -continue IVF   Persistent atrial fibrillation -She is chronically on apixaban which will be held in case she need surgical intervention -Rate controlled   Complete heart block -Status post permanent pacemaker -Patient follows with Dr. Crissie Sickles -Holding diltiazem secondary to soft blood pressure   Chronic diastolic CHF -Holding Lasix temporarily secondary to acute kidney injury   Peripheral neuropathy -Continue gabapentin   Goals of Care -seems to be disagreement amongst siblings after I spoke with brother and sister -remain FULL CODE for now  -I discussed the patient's overall poor prognosis with patient's son and daughter at the bedside at the time of admission -Son wanted patient to be DNR, but daughter wanted full code--as a result of the disagreement, patient was initially made full code at time of admission -ultimately when the patient decompensated, GOC were discussed again with patient's son and patient was changed to DNR -palliative medicine consult   Brief Hospital Course (including significant findings, care, treatment, and services provided and events leading to death)  Wendy Perkins is a 88 y.o. year old female who  with a history of complete heart block status post PPM, hypertension, hyperlipidemia, chronic respiratory failure on 2 L, persistent atrial fibrillation, peripheral neuropathy, GERD presenting with at least 2-day  history of left foot pain.  Unfortunately, the patient is unable to provide any significant history  secondary to her encephalopathy.  History is obtained from review of the medical record and speaking with the patient's family at the bedside.  Apparently, the patient had a callus on her left forefoot around the left MTP J area for which she saw podiatry on 12/18/2021.  Apparently,  the callus was trimmed on 12/18/2021.  The patient was started on doxycycline on the same day.  Daughter at bedside states that since then her left great toe has turned " dark".  The patient herself complains of some pain in her left foot, but she was unable to clarify the exact location, duration, or severity.  On the morning of 12/31/2021, the patient was noted to be somnolent with generalized weakness.  The patient herself denies any chest pain, abdominal pain, shortness of breath presently.  She did have an episode of nausea and vomiting in triage.   ED In the ED, the patient was afebrile with soft blood pressures down to 91/60.  Oxygen saturation was 94-96% on 2 L.  BMP showed sodium 137, potassium 6.2, bicarbonate 16, serum creatinine 2.23.  AST 44, ALT 24, alk phosphatase 122, T bilirubin 1.5.  WBC 21.9, hemoglobin 12.6, platelets 211.  The patient was given 2 L of normal saline.  Bicarbonate, D50, and insulin were given as temporizing measures for her high potassium.  I ordered Lokelma.  The patient was started on vancomycin and cefepime. After IV fluid boluses, pt's BP improved and stabilized and patient was more alert.  Blood and urine cultures were ordered prior to antibiotics.  Continued fluid boluses and IVF were given for elevated lactate.  COVID-19 assay had yet to be ordered at the time of my initial evaluation.  Subsequently, it was ordered in preparation for admission into the hospital.  The patient remained stable on 2L oxygen which is her baseline chronic demand without increase WOB and good oxygen saturation.  GOC was discussed at the time of my initial evaluation.  Because son and daughter could not agree on code  status, the patient was admitted as a full code pending further [place] discussions. Later in the evening, COVID 19 PCR returned positive and patient was started on remdesivir and IV steroids.  Unfortunately, she became more hypotensive and was moved to ICU.   Dr. Josephine Cables had [place] discussion with patient's son who agreed with DNR.  Whe was initially started on levophed, but by the time she arrived to the ICU, the patient had expired.    Pertinent Labs and Studies  Significant Diagnostic Studies DG Shoulder Right  Result Date: 11/22/2021 X-rays of bilateral shoulders were obtained in clinic today.  There is evidence of severe glenohumeral arthritis, as well as rotator cuff arthropathy.  There is acetabular rotation of the acromion and bilateral shoulders.  There is flattening of the humeral head in the right shoulder.   Impression: Severe bilateral glenohumeral arthritis, with evidence of chronic rotator cuff tear.    DG Chest Portable 1 View  Result Date: 12/13/2021 CLINICAL DATA:  Shortness of breath. EXAM: PORTABLE CHEST 1 VIEW COMPARISON:  Chest x-ray 12/27/2021. FINDINGS: Left-sided pacemaker is again noted. The cardiac silhouette is markedly enlarged, unchanged. There are minimal patchy bibasilar opacities. No pleural effusion or pneumothorax. There is no focal lung consolidation, pleural effusion or pneumothorax. There are atherosclerotic calcifications of the aorta. Severe degenerative changes affect the shoulders. No acute fractures are seen. IMPRESSION: 1. Stable  marked cardiomegaly. 2. Minimal bibasilar atelectasis/airspace disease. Electronically Signed   By: Ronney Asters M.D.   On: 12/30/2021 22:20   DG Chest Port 1 View  Result Date: 12/25/2021 CLINICAL DATA:  Shortness of breath EXAM: PORTABLE CHEST 1 VIEW COMPARISON:  07/18/2017 FINDINGS: Transverse diameter of heart is increased. There are no signs of pulmonary edema or focal pulmonary consolidation. Lateral CP angles are indistinct.  There is no pneumothorax. Severe degenerative changes are noted in both shoulders. Pacemaker battery is seen in the left infraclavicular region. IMPRESSION: Cardiomegaly. There are no signs of pulmonary edema or focal pulmonary consolidation. Blunting of lateral CP angles may suggest pleural thickening or minimal effusions. Electronically Signed   By: Elmer Picker M.D.   On: 12/31/2021 12:31   DG Shoulder Left  Result Date: 11/22/2021 X-rays of bilateral shoulders were obtained in clinic today.  There is evidence of severe glenohumeral arthritis, as well as rotator cuff arthropathy.  There is acetabular rotation of the acromion and bilateral shoulders.  There is flattening of the humeral head in the right shoulder.   Impression: Severe bilateral glenohumeral arthritis, with evidence of chronic rotator cuff tear.    DG Foot Complete Left  Result Date: 12/16/2021 CLINICAL DATA:  Weakness, shortness of breath, left foot pain EXAM: LEFT FOOT - COMPLETE 3+ VIEW COMPARISON:  None. FINDINGS: Diffuse osteopenia. There is diffuse soft tissue swelling. There is no evidence of acute fracture. There is no frank bony destruction. 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 Mild-to-moderate forefoot osteoarthritis. Mild hindfoot osteoarthritis. IMPRESSION: Diffuse soft tissue swelling of the foot. No frank bony destruction to suggest osteomyelitis. MRI would be more sensitive. Electronically Signed   By: Maurine Simmering M.D.   On: 01/07/2022 12:31   DG Foot Complete Left  Result Date: 12/18/2021 Please see detailed radiograph report in office note.   Microbiology Recent Results (from the past 240 hour(s))  Culture, blood (single)     Status: None (Preliminary result)   Collection Time: 12/24/2021 11:00 AM   Specimen: BLOOD LEFT ARM  Result Value Ref Range Status   Specimen Description   Final    BLOOD LEFT ARM Performed at [facility], [address], [place], [place] [address]    Special Requests   Final     BOTTLES DRAWN AEROBIC AND ANAEROBIC Blood Culture results may not be optimal due to an excessive volume of blood received in culture bottles Performed at [facility], [address] [place], [place] [address]    Culture  Setup Time   Final    GRAM NEGATIVE RODS anaerobic bottle Gram Stain Report Called to,Read Back By and Verified With: [email or web address]  by Zigmund Daniel, b 01/03/2022 Organism ID to follow Performed at [facility], [place] [address], [place], [place] [address]    Culture PENDING  Incomplete   Report Status PENDING  Incomplete  Resp Panel by RT-PCR (Flu A&B, Covid) Nasopharyngeal Swab     Status: Abnormal   Collection Time: 12/25/2021  6:10 PM   Specimen: Nasopharyngeal Swab; Nasopharyngeal(NP) swabs in vial transport medium  Result Value Ref Range Status   SARS Coronavirus 2 by RT PCR POSITIVE (A) NEGATIVE Final    Comment: (NOTE) SARS-CoV-2 target nucleic acids are DETECTED.  The SARS-CoV-2 RNA is generally detectable in upper respiratory specimens during the acute phase of infection. Positive results are indicative of the presence of the identified virus, but do not rule out bacterial infection or co-infection with other pathogens not detected by the test. Clinical  correlation with patient history and other diagnostic information is necessary to determine patient infection status. The expected result is Negative.  Fact Sheet for Patients: EntrepreneurPulse.com.au  Fact Sheet for Healthcare Providers: IncredibleEmployment.be  This test is not yet approved or cleared by the [place] FDA and  has been authorized for detection and/or diagnosis of SARS-CoV-2 by FDA under an Emergency Use Authorization (EUA).  This EUA will remain in effect (meaning this test can be used) for the duration of  the COVID-19 declaration under Section 564(b)(1) of the A ct, 21 U.S.C. section 360bbb-3(b)(1), unless the authorization is terminated or  revoked sooner.     Influenza A by PCR NEGATIVE NEGATIVE Final   Influenza B by PCR NEGATIVE NEGATIVE Final    Comment: (NOTE) The Xpert Xpress SARS-CoV-2/FLU/RSV plus assay is intended as an aid in the diagnosis of influenza from Nasopharyngeal swab specimens and should not be used as a sole basis for treatment. Nasal washings and aspirates are unacceptable for Xpert Xpress SARS-CoV-2/FLU/RSV testing.  Fact Sheet for Patients: EntrepreneurPulse.com.au  Fact Sheet for Healthcare Providers: IncredibleEmployment.be  This test is not yet approved or cleared by the [place] FDA and has been authorized for detection and/or diagnosis of SARS-CoV-2 by FDA under an Emergency Use Authorization (EUA). This EUA will remain in effect (meaning this test can be used) for the duration of the COVID-19 declaration under Section 564(b)(1) of the Act, 21 U.S.C. section 360bbb-3(b)(1), unless the authorization is terminated or revoked.  Performed at [facility], [address] [place], [place] [address]   MRSA Next Gen by PCR, Nasal     Status: None   Collection Time: 01-20-22 12:43 AM   Specimen: Nasal Mucosa; Nasal Swab  Result Value Ref Range Status   MRSA by PCR Next Gen NOT DETECTED NOT DETECTED Final    Comment: (NOTE) The GeneXpert MRSA Assay (FDA approved for NASAL specimens only), is one component of a comprehensive MRSA colonization surveillance program. It is not intended to diagnose MRSA infection nor to guide or monitor treatment for MRSA infections. Test performance is not FDA approved in patients less than 33 years old. Performed at [facility], [address], [place], [place] [address]     Lab Basic Metabolic Panel: Recent Labs  Lab 12/28/2021 1100  NA 137  K 6.2*  CL 104  CO2 16*  GLUCOSE 103*  BUN 37*  CREATININE 2.23*  CALCIUM 8.6*   Liver Function Tests: Recent Labs  Lab 12/17/2021 1100  AST 44*  ALT 24  ALKPHOS 122   BILITOT 1.5*  PROT 6.5  ALBUMIN 3.2*   No results for input(s): LIPASE, AMYLASE in the last 168 hours. No results for input(s): AMMONIA in the last 168 hours. CBC: Recent Labs  Lab 01/03/2022 1100  WBC 21.9*  NEUTROABS 18.8*  HGB 12.6  HCT 41.7  MCV 89.7  PLT 211   Cardiac Enzymes: No results for input(s): CKTOTAL, CKMB, CKMBINDEX, TROPONINI in the last 168 hours. Sepsis Labs: Recent Labs  Lab 12/25/2021 1100 01/04/2022 1628 12/18/2021 2320  WBC 21.9*  --   --   LATICACIDVEN 8.7* >9.0* >9.0*    Procedures/Operations     Shanon Brow Derika Eckles 01/20/2022, 8:17 AM

## 2022-01-10 NOTE — Death Summary Note (Incomplete)
Pt brought to ICU bed 2 via stretcher.  Son escorted to waiting room.  Pt  hooked up to monitor and wide complex ekg noted.  O2 sats not picking up and pt pulseless.  Pt a DNR.  Verified with second nurse.  Notified son of pt's passing.

## 2022-01-10 NOTE — Progress Notes (Signed)
Pt's hearing aids and clothes given to pt's son

## 2022-01-10 DEATH — deceased
